# Patient Record
Sex: Male | Born: 1980
Health system: Southern US, Community
[De-identification: ages and names within clinical notes are randomized; demographics above are authoritative.]

## PROBLEM LIST (undated history)

## (undated) DIAGNOSIS — N183 Chronic kidney disease, stage 3 unspecified: Secondary | ICD-10-CM

## (undated) DIAGNOSIS — Z87442 Personal history of urinary calculi: Secondary | ICD-10-CM

## (undated) DIAGNOSIS — K219 Gastro-esophageal reflux disease without esophagitis: Secondary | ICD-10-CM

## (undated) DIAGNOSIS — E785 Hyperlipidemia, unspecified: Secondary | ICD-10-CM

## (undated) DIAGNOSIS — G4733 Obstructive sleep apnea (adult) (pediatric): Secondary | ICD-10-CM

## (undated) DIAGNOSIS — G4730 Sleep apnea, unspecified: Secondary | ICD-10-CM

## (undated) DIAGNOSIS — M109 Gout, unspecified: Secondary | ICD-10-CM

## (undated) DIAGNOSIS — R519 Headache, unspecified: Secondary | ICD-10-CM

## (undated) DIAGNOSIS — Z8669 Personal history of other diseases of the nervous system and sense organs: Secondary | ICD-10-CM

## (undated) DIAGNOSIS — B0230 Zoster ocular disease, unspecified: Secondary | ICD-10-CM

## (undated) DIAGNOSIS — I130 Hypertensive heart and chronic kidney disease with heart failure and stage 1 through stage 4 chronic kidney disease, or unspecified chronic kidney disease: Secondary | ICD-10-CM

## (undated) DIAGNOSIS — K589 Irritable bowel syndrome without diarrhea: Secondary | ICD-10-CM

## (undated) DIAGNOSIS — F32A Depression, unspecified: Secondary | ICD-10-CM

## (undated) DIAGNOSIS — K5909 Other constipation: Secondary | ICD-10-CM

## (undated) DIAGNOSIS — I509 Heart failure, unspecified: Secondary | ICD-10-CM

## (undated) DIAGNOSIS — E669 Obesity, unspecified: Secondary | ICD-10-CM

## (undated) DIAGNOSIS — K5900 Constipation, unspecified: Secondary | ICD-10-CM

## (undated) DIAGNOSIS — I10 Essential (primary) hypertension: Secondary | ICD-10-CM

## (undated) HISTORY — PX: EYE SURGERY: SHX253

## (undated) HISTORY — DX: Obesity, unspecified: E66.9

## (undated) HISTORY — DX: Irritable bowel syndrome, unspecified: K58.9

## (undated) HISTORY — DX: Sleep apnea, unspecified: G47.30

## (undated) HISTORY — PX: RETINAL DETACHMENT REPAIR W/ SCLERAL BUCKLE LE: SHX2338

## (undated) HISTORY — PX: CHOLECYSTECTOMY: SHX55

## (undated) HISTORY — DX: Constipation, unspecified: K59.00

## (undated) HISTORY — DX: Hyperlipidemia, unspecified: E78.5

## (undated) HISTORY — DX: Chronic kidney disease, stage 3 unspecified: N18.30

## (undated) HISTORY — DX: Gout, unspecified: M10.9

## (undated) HISTORY — DX: Chronic kidney disease, stage 3 (moderate): N18.3

## (undated) HISTORY — DX: Gastro-esophageal reflux disease without esophagitis: K21.9

## (undated) HISTORY — DX: Zoster ocular disease, unspecified: B02.30

---

## 2001-05-19 ENCOUNTER — Emergency Department (HOSPITAL_COMMUNITY): Admission: EM | Admit: 2001-05-19 | Discharge: 2001-05-19 | Payer: Self-pay | Admitting: Emergency Medicine

## 2002-05-19 ENCOUNTER — Emergency Department (HOSPITAL_COMMUNITY): Admission: EM | Admit: 2002-05-19 | Discharge: 2002-05-19 | Payer: Self-pay | Admitting: Emergency Medicine

## 2011-01-05 DIAGNOSIS — H3320 Serous retinal detachment, unspecified eye: Secondary | ICD-10-CM | POA: Insufficient documentation

## 2011-02-14 DIAGNOSIS — H348322 Tributary (branch) retinal vein occlusion, left eye, stable: Secondary | ICD-10-CM | POA: Insufficient documentation

## 2011-08-16 ENCOUNTER — Emergency Department (HOSPITAL_BASED_OUTPATIENT_CLINIC_OR_DEPARTMENT_OTHER): Payer: BC Managed Care – PPO

## 2011-08-16 ENCOUNTER — Emergency Department (HOSPITAL_BASED_OUTPATIENT_CLINIC_OR_DEPARTMENT_OTHER)
Admission: EM | Admit: 2011-08-16 | Discharge: 2011-08-16 | Disposition: A | Discharge status: Home / Self Care | Payer: BC Managed Care – PPO | Attending: Emergency Medicine | Admitting: Emergency Medicine

## 2011-08-16 ENCOUNTER — Encounter (HOSPITAL_BASED_OUTPATIENT_CLINIC_OR_DEPARTMENT_OTHER): Payer: Self-pay

## 2011-08-16 DIAGNOSIS — I130 Hypertensive heart and chronic kidney disease with heart failure and stage 1 through stage 4 chronic kidney disease, or unspecified chronic kidney disease: Secondary | ICD-10-CM | POA: Insufficient documentation

## 2011-08-16 DIAGNOSIS — M25469 Effusion, unspecified knee: Secondary | ICD-10-CM | POA: Insufficient documentation

## 2011-08-16 DIAGNOSIS — M25462 Effusion, left knee: Secondary | ICD-10-CM

## 2011-08-16 DIAGNOSIS — N183 Chronic kidney disease, stage 3 unspecified: Secondary | ICD-10-CM | POA: Insufficient documentation

## 2011-08-16 DIAGNOSIS — N189 Chronic kidney disease, unspecified: Secondary | ICD-10-CM | POA: Insufficient documentation

## 2011-08-16 DIAGNOSIS — I509 Heart failure, unspecified: Secondary | ICD-10-CM | POA: Insufficient documentation

## 2011-08-16 HISTORY — DX: Chronic kidney disease, stage 3 (moderate): N18.3

## 2011-08-16 HISTORY — DX: Hypertensive heart and chronic kidney disease with heart failure and stage 1 through stage 4 chronic kidney disease, or unspecified chronic kidney disease: I13.0

## 2011-08-16 HISTORY — DX: Heart failure, unspecified: I50.9

## 2011-08-16 HISTORY — DX: Chronic kidney disease, stage 3 unspecified: N18.30

## 2011-08-16 MED ORDER — HYDROCODONE-ACETAMINOPHEN 5-325 MG PO TABS
2.0000 | ORAL_TABLET | Freq: Once | ORAL | Status: AC
  Administered 2011-08-16: 2 via ORAL
  Filled 2011-08-16: qty 2

## 2011-08-16 MED ORDER — HYDROCODONE-ACETAMINOPHEN 5-325 MG PO TABS: 2.0000 | ORAL_TABLET | ORAL | Status: AC | PRN

## 2011-08-16 NOTE — ED Notes (Signed)
Pt returned from radiology.  Warm blankets provided.

## 2011-08-16 NOTE — ED Provider Notes (Signed)
History     CSN: [id number]  Arrival date & time 08/16/11  1703   First MD Initiated Contact with Patient 08/16/11 1732      Chief Complaint  Patient presents with  . Knee Injury    (Consider location/radiation/quality/duration/timing/severity/associated sxs/prior treatment) Patient is a 31 y.o. male presenting with knee pain. The history is provided by the patient. No language interpreter was used.  Knee Pain This is a new problem. Episode onset: monday  2 days ago. The problem occurs constantly. The symptoms are aggravated by bending. He has tried acetaminophen for the symptoms.  Pt reports he was standing on a lift gate and it jerked up from dock quickly.  Pt reports knee was pushed up quickly.  Pt reports increased swelling and pain  Past Medical History  Diagnosis Date  . CHF (congestive heart failure)   . 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 Cholecystectomy     No family history on file.  History  Substance Use Topics  . Smoking status: Never Smoker   . Smokeless tobacco: Not on file  . Alcohol Use: Yes     socially      Review of Systems  All other systems reviewed and are negative.    Allergies  Review of patient's allergies indicates no known allergies.  Home Medications   Current Outpatient Rx  Name Route Sig Dispense Refill  . CARVEDILOL 25 MG PO TABS Oral Take 25 mg by mouth 2 (two) times daily with a meal. Patient is to use 37.5 milligrams of this medication.    . FUROSEMIDE 40 MG PO TABS Oral Take 40 mg by mouth daily.    [name] HYDRALAZINE HCL 100 MG PO TABS Oral Take 100 mg by mouth 3 (three) times daily.    . IBUPROFEN 200 MG PO TABS Oral Take 400 mg by mouth every 6 (six) hours as needed. For pain.    [name] OLMESARTAN MEDOXOMIL 40 MG PO TABS Oral Take 40 mg by mouth daily.    [name] SPIRONOLACTONE 25 MG PO TABS Oral Take 25 mg by mouth daily.      BP 147/98   Pulse 75  Temp 99.7 F (37.6 C) (Oral)  Resp 18  Ht 5' 8.5" (1.74 m)  Wt 337 lb (152.862 kg)  BMI 50.50 kg/m2  SpO2 97%  Physical Exam  Nursing note and vitals reviewed. Constitutional: He is oriented to person, place, and time. He appears well-developed and well-nourished.  HENT:  Head: Normocephalic and atraumatic.  Eyes: Pupils are equal, round, and reactive to light.  Neck: Normal range of motion.  Musculoskeletal: He exhibits tenderness.       Tender left knee, effusion, decreased range of motion,   nv and ns intact  Neurological: He is alert and oriented to person, place, and time. He has normal reflexes.  Skin: Skin is warm.  Psychiatric: He has a normal mood and affect.    ED Course  Procedures (including critical care time)  Labs Reviewed - No data to display Dg Knee Complete 4 Views Left  08/16/2011  *RADIOLOGY REPORT*  Clinical Data: Left knee pain.  Left knee injury.  LEFT KNEE - COMPLETE 4+ VIEW  Comparison: None.  Findings: No fracture.  Mild medial compartment osteoarthritis.  No effusion.  The alignment of the knee is anatomic.  Small dystrophic calcification is present along the lateral proximal tibial metaphysis  and muscular origin.  IMPRESSION: No acute osseous abnormality.  Mild medial compartment osteoarthritis.  Original Report Authenticated By: Dereck Ligas, M.D.     1. Effusion of left knee       MDM  Pt placed in knee imbolizer,   I advised schedule to see Dr. Barbaraann Barthel for recheck in 3-4 days.  Ice elevate,  Rx for ibuprofen and hydrocodone       [place], [place] 08/16/11 [place], [place] 08/16/11 [phone]

## 2011-08-16 NOTE — ED Notes (Signed)
Injury to left knee that occurred while at work on Monday.

## 2011-08-16 NOTE — ED Notes (Signed)
Patient transported to X-ray via stretcher 

## 2011-08-17 ENCOUNTER — Ambulatory Visit (INDEPENDENT_AMBULATORY_CARE_PROVIDER_SITE_OTHER): Payer: BC Managed Care – PPO | Admitting: Family Medicine

## 2011-08-17 ENCOUNTER — Encounter: Payer: Self-pay | Admitting: Family Medicine

## 2011-08-17 VITALS — BP 116/74 | HR 80 | Ht 69.0 in | Wt 337.0 lb

## 2011-08-17 DIAGNOSIS — S8992XA Unspecified injury of left lower leg, initial encounter: Secondary | ICD-10-CM | POA: Insufficient documentation

## 2011-08-17 DIAGNOSIS — S8990XA Unspecified injury of unspecified lower leg, initial encounter: Secondary | ICD-10-CM

## 2011-08-17 DIAGNOSIS — S99919A Unspecified injury of unspecified ankle, initial encounter: Secondary | ICD-10-CM

## 2011-08-17 NOTE — Patient Instructions (Addendum)
Your injury and exam are most consistent with a severe knee contusion though lateral meniscal tear is also possible. Both are treated conservatively at first (unless your knee locks in one position). Ice knee 15 minutes at a time 3-4 times a day. Topical capsaicin up to 4 times a day can help with pain as well. Knee brace as needed for support. Crutches if needed to help with walking. Elevate above the level of your heart as much as possible. Start motion exercises - 3 sets of 10 with or without a towel - do daily for next 2 weeks. Follow up with me in 2 weeks. Would consider formal physical therapy or MRI depending on your progress.

## 2011-08-17 NOTE — Assessment & Plan Note (Signed)
most consistent with severe knee contusion though may have lateral meniscal tear.  X-rays reviewed - no bony abnormalities.  Icing, ROM exercises, hydrocodone 5/500 q6h prn severe pain #60 prescribed.  No nsaids with kidney disease.  Consider capsaicin.  Crutches and knee brace - weight bear as tolerated.  F/u in 2 weeks for reevaluation - consider further imaging or PT depending on improvement.

## 2011-08-17 NOTE — Progress Notes (Signed)
Subjective:    Patient ID: Frank Coffey, male    DOB: 12-03-1980, 31 y.o.   MRN: [id number]  PCP: [facility]  HPI 31 yo M here for left knee injury.  Patient reports on 7/29 he was at work standing half on a truck and half on a dock at [employer]. Spring broke on dock plate and the plate went upward jamming his left knee. Pain anterior, medial, and lateral within left knee since than that worsened throughout shift. + swelling but no bruising. No prior injury to left knee. Has been icing and taking hydrocodone from ED. Using crutches and wearing immobilizer. X-rays negative for fracture or other acute bony abnormalities. No catching or locking. Feels stiff - ? If may hyperextend.  Past Medical History  Diagnosis Date  . CHF (congestive heart failure)   . 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 Chronic kidney disease, stage 3, mod decreased GFR     Current Outpatient Prescriptions on File Prior to Visit  Medication Sig Dispense Refill  . carvedilol (COREG) 25 MG tablet Take 25 mg by mouth 2 (two) times daily with a meal. Patient is to use 37.5 milligrams of this medication.      . furosemide (LASIX) 40 MG tablet Take 40 mg by mouth daily.      . hydrALAZINE (APRESOLINE) 100 MG tablet Take 100 mg by mouth 3 (three) times daily.      [name] HYDROcodone-acetaminophen (NORCO/VICODIN) 5-325 MG per tablet Take 2 tablets by mouth every 4 (four) hours as needed for pain.  20 tablet  0  . olmesartan (BENICAR) 40 MG tablet Take 40 mg by mouth daily.      [name] spironolactone (ALDACTONE) 25 MG tablet Take 25 mg by mouth daily.       Current Facility-Administered Medications on File Prior to Visit  Medication Dose Route Frequency Provider Last Rate Last Dose  . HYDROcodone-acetaminophen (NORCO/VICODIN) 5-325 MG per tablet 2 tablet  2 tablet Oral Once [place], [place]   2 tablet at 08/16/11 1800    Past Surgical History  Procedure Date  .  Cholecystectomy     No Known Allergies  History   Social History  . Marital Status: Married    Spouse Name: N/A    Number of Children: N/A  . Years of Education: N/A   Occupational History  . Not on file.   Social History Main Topics  . Smoking status: Never Smoker   . Smokeless tobacco: Not on file  . Alcohol Use: Yes     socially  . Drug Use: No  . Sexually Active: Not on file   Other Topics Concern  . Not on file   Social History Narrative  . No narrative on file    Family History  Problem Relation Age of Onset  . Hypertension Mother   . Diabetes Neg Hx   . Heart attack Neg Hx   . Hyperlipidemia Neg Hx   . Sudden death Neg Hx     BP 116/74  Pulse 80  Ht 5\' 9"  (1.753 m)  Wt 337 lb (152.862 kg)  BMI 49.77 kg/m2  Review of Systems See HPI above.    Objective:   Physical Exam Gen: NAD  L knee: No gross deformity, ecchymoses.  Questionable effusion.   Lateral joint line and post patellar facet TTP.  No medial joint line ttp.  TTP patellar tendon as well. ROM 0 -  90 degrees passively and actively. Negative ant/post drawers - guarding. Negative valgus/varus testing. Negative lachmanns. Pain laterally with mcmurrays, apleys but no clicks Negative patellar apprehension. NV intact distally.  R knee: FROM without pain or instability.    Assessment & Plan:  1. Left knee injury - most consistent with severe knee contusion though may have lateral meniscal tear.  X-rays reviewed - no bony abnormalities.  Icing, ROM exercises, hydrocodone 5/500 q6h prn severe pain #60 prescribed.  No nsaids with kidney disease.  Consider capsaicin.  Crutches and knee brace - weight bear as tolerated.  F/u in 2 weeks for reevaluation - consider further imaging or PT depending on improvement.

## 2011-08-20 NOTE — ED Provider Notes (Signed)
Medical screening examination/treatment/procedure(s) were performed by non-physician practitioner and as supervising physician I was immediately available for consultation/collaboration.  Chauncy Passy, MD 08/20/11 1505

## 2011-08-31 ENCOUNTER — Ambulatory Visit: Payer: Self-pay | Admitting: Family Medicine

## 2011-11-10 ENCOUNTER — Emergency Department (HOSPITAL_BASED_OUTPATIENT_CLINIC_OR_DEPARTMENT_OTHER)
Admission: EM | Admit: 2011-11-10 | Discharge: 2011-11-10 | Disposition: A | Discharge status: Home / Self Care | Payer: Worker's Compensation | Attending: Emergency Medicine | Admitting: Emergency Medicine

## 2011-11-10 ENCOUNTER — Emergency Department (HOSPITAL_BASED_OUTPATIENT_CLINIC_OR_DEPARTMENT_OTHER): Payer: Worker's Compensation

## 2011-11-10 ENCOUNTER — Encounter (HOSPITAL_BASED_OUTPATIENT_CLINIC_OR_DEPARTMENT_OTHER): Payer: Self-pay | Admitting: *Deleted

## 2011-11-10 DIAGNOSIS — Z79899 Other long term (current) drug therapy: Secondary | ICD-10-CM | POA: Insufficient documentation

## 2011-11-10 DIAGNOSIS — Y990 Civilian activity done for income or pay: Secondary | ICD-10-CM | POA: Insufficient documentation

## 2011-11-10 DIAGNOSIS — N189 Chronic kidney disease, unspecified: Secondary | ICD-10-CM | POA: Insufficient documentation

## 2011-11-10 DIAGNOSIS — N183 Chronic kidney disease, stage 3 unspecified: Secondary | ICD-10-CM | POA: Insufficient documentation

## 2011-11-10 DIAGNOSIS — I130 Hypertensive heart and chronic kidney disease with heart failure and stage 1 through stage 4 chronic kidney disease, or unspecified chronic kidney disease: Secondary | ICD-10-CM | POA: Insufficient documentation

## 2011-11-10 DIAGNOSIS — S9030XA Contusion of unspecified foot, initial encounter: Secondary | ICD-10-CM | POA: Insufficient documentation

## 2011-11-10 DIAGNOSIS — Y929 Unspecified place or not applicable: Secondary | ICD-10-CM | POA: Insufficient documentation

## 2011-11-10 DIAGNOSIS — W208XXA Other cause of strike by thrown, projected or falling object, initial encounter: Secondary | ICD-10-CM | POA: Insufficient documentation

## 2011-11-10 NOTE — ED Notes (Signed)
Patient states he dropped a box weighting approximately 65 lbs on his left foot today at 10:00 am.  Now has pain on left great toe and foot, with injury to his toenail.

## 2011-11-10 NOTE — ED Provider Notes (Addendum)
History     CSN: [id number]  Arrival date & time 11/10/11  59   First MD Initiated Contact with Patient 11/10/11 1458      Chief Complaint  Patient presents with  . Foot Injury    left    (Consider location/radiation/quality/duration/timing/severity/associated sxs/prior treatment) Patient is a 31 y.o. male presenting with foot injury. The history is provided by the patient.  Foot Injury  The incident occurred 1 to 2 hours ago. The incident occurred at work. The injury mechanism was a direct blow (65lb box fell on left foot). The pain is present in the left foot. The quality of the pain is described as aching and throbbing. The pain is at a severity of 4/10. The pain is moderate. The pain has been constant since onset. Pertinent negatives include no inability to bear weight, no loss of motion and no tingling. The symptoms are aggravated by bearing weight and palpation. He has tried nothing for the symptoms. The treatment provided no relief.    Past Medical History  Diagnosis Date  . CHF (congestive heart failure)   . 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 Chronic kidney disease, stage 3, mod decreased GFR     Past Surgical History  Procedure Date  . Cholecystectomy     Family History  Problem Relation Age of Onset  . Hypertension Mother   . Diabetes Neg Hx   . Heart attack Neg Hx   . Hyperlipidemia Neg Hx   . Sudden death Neg Hx     History  Substance Use Topics  . Smoking status: Never Smoker   . Smokeless tobacco: Not on file  . Alcohol Use: Yes     socially      Review of Systems  Neurological: Negative for tingling.  All other systems reviewed and are negative.    Allergies  Review of patient's allergies indicates no known allergies.  Home Medications   Current Outpatient Rx  Name Route Sig Dispense Refill  . CARVEDILOL 25 MG PO TABS Oral Take 25 mg by mouth 2 (two) times daily with a meal. Patient  is to use 37.5 milligrams of this medication.    . FUROSEMIDE 40 MG PO TABS Oral Take 40 mg by mouth daily.    [name] HYDRALAZINE HCL 100 MG PO TABS Oral Take 100 mg by mouth 3 (three) times daily.    [name] OLMESARTAN MEDOXOMIL 40 MG PO TABS Oral Take 40 mg by mouth daily.    [name] SPIRONOLACTONE 25 MG PO TABS Oral Take 25 mg by mouth daily.      BP 145/91  Pulse 84  Temp 99.4 F (37.4 C) (Oral)  Resp 16  Ht 5' 8.5" (1.74 m)  Wt 341 lb (154.677 kg)  BMI 51.09 kg/m2  SpO2 97%  Physical Exam  Nursing note and vitals reviewed. Constitutional: He is oriented to person, place, and time. He appears well-developed and well-nourished. No distress.  HENT:  Head: Normocephalic and atraumatic.  Mouth/Throat: Oropharynx is clear and moist.  Eyes: EOM are normal. Pupils are equal, round, and reactive to light.  Musculoskeletal:       Left foot: He exhibits tenderness. He exhibits normal range of motion and no deformity.       Feet:  Neurological: He is alert and oriented to person, place, and time.  Skin: Skin is warm and dry. No rash noted. No erythema.    ED Course  Procedures (including  critical care time)  Labs Reviewed - No data to display Dg Foot Complete Left  11/10/2011  *RADIOLOGY REPORT*  Clinical Data: Foot injury  LEFT FOOT - COMPLETE 3+ VIEW  Comparison: None  Findings: There is no evidence of fracture or dislocation.Posterior calcaneal heel spur noted.  Soft tissues are unremarkable.  IMPRESSION:  1.  No acute bony abnormalities.   Original Report Authenticated By: Angelita Ingles, M.D.      1. Foot contusion       MDM   Crush injury to the left foot.  No deformity and no ankle tenderness.  Plain film wnl.        Blanchie Dessert, MD 11/10/11 1604  Blanchie Dessert, MD 11/10/11 [phone]

## 2012-02-23 DIAGNOSIS — H25042 Posterior subcapsular polar age-related cataract, left eye: Secondary | ICD-10-CM | POA: Insufficient documentation

## 2012-07-24 ENCOUNTER — Emergency Department (HOSPITAL_BASED_OUTPATIENT_CLINIC_OR_DEPARTMENT_OTHER)
Admission: EM | Admit: 2012-07-24 | Discharge: 2012-07-24 | Disposition: A | Discharge status: Home / Self Care | Payer: BC Managed Care – PPO | Attending: Emergency Medicine | Admitting: Emergency Medicine

## 2012-07-24 ENCOUNTER — Emergency Department (HOSPITAL_BASED_OUTPATIENT_CLINIC_OR_DEPARTMENT_OTHER): Payer: BC Managed Care – PPO

## 2012-07-24 ENCOUNTER — Encounter (HOSPITAL_BASED_OUTPATIENT_CLINIC_OR_DEPARTMENT_OTHER): Payer: Self-pay | Admitting: Family Medicine

## 2012-07-24 DIAGNOSIS — R51 Headache: Secondary | ICD-10-CM | POA: Insufficient documentation

## 2012-07-24 DIAGNOSIS — I130 Hypertensive heart and chronic kidney disease with heart failure and stage 1 through stage 4 chronic kidney disease, or unspecified chronic kidney disease: Secondary | ICD-10-CM | POA: Insufficient documentation

## 2012-07-24 DIAGNOSIS — I509 Heart failure, unspecified: Secondary | ICD-10-CM | POA: Insufficient documentation

## 2012-07-24 DIAGNOSIS — Z79899 Other long term (current) drug therapy: Secondary | ICD-10-CM | POA: Insufficient documentation

## 2012-07-24 DIAGNOSIS — N183 Chronic kidney disease, stage 3 unspecified: Secondary | ICD-10-CM | POA: Insufficient documentation

## 2012-07-24 DIAGNOSIS — R519 Headache, unspecified: Secondary | ICD-10-CM

## 2012-07-24 LAB — CBC WITH DIFFERENTIAL/PLATELET
Basophils Absolute: 0 10*3/uL (ref 0.0–0.1)
Basophils Relative: 0 % (ref 0–1)
Eosinophils Absolute: 0.2 10*3/uL (ref 0.0–0.7)
MCH: 23.3 pg — ABNORMAL LOW (ref 26.0–34.0)
MCHC: 32.6 g/dL (ref 30.0–36.0)
Monocytes Absolute: 1.6 10*3/uL — ABNORMAL HIGH (ref 0.1–1.0)
Monocytes Relative: 18 % — ABNORMAL HIGH (ref 3–12)
Neutro Abs: 5.8 10*3/uL (ref 1.7–7.7)
Neutrophils Relative %: 66 % (ref 43–77)
RDW: 18.7 % — ABNORMAL HIGH (ref 11.5–15.5)

## 2012-07-24 LAB — BASIC METABOLIC PANEL
BUN: 23 mg/dL (ref 6–23)
Calcium: 10 mg/dL (ref 8.4–10.5)
GFR calc Af Amer: 30 mL/min — ABNORMAL LOW (ref >90)
GFR calc non Af Amer: 26 mL/min — ABNORMAL LOW (ref >90)
Glucose, Bld: 94 mg/dL (ref 70–99)
Potassium: 3.5 mEq/L (ref 3.5–5.1)
Sodium: 138 mEq/L (ref 135–145)

## 2012-07-24 MED ORDER — KETOROLAC TROMETHAMINE 30 MG/ML IJ SOLN
30.0000 mg | Freq: Once | INTRAMUSCULAR | Status: AC
  Administered 2012-07-24: 30 mg via INTRAVENOUS
  Filled 2012-07-24: qty 1

## 2012-07-24 MED ORDER — SODIUM CHLORIDE 0.9 % IV BOLUS (SEPSIS)
1000.0000 mL | Freq: Once | INTRAVENOUS | Status: AC
  Administered 2012-07-24: 1000 mL via INTRAVENOUS

## 2012-07-24 MED ORDER — HYDROMORPHONE HCL PF 1 MG/ML IJ SOLN
1.0000 mg | Freq: Once | INTRAMUSCULAR | Status: AC
  Administered 2012-07-24: 1 mg via INTRAVENOUS
  Filled 2012-07-24: qty 1

## 2012-07-24 MED ORDER — METOCLOPRAMIDE HCL 5 MG/ML IJ SOLN
10.0000 mg | Freq: Once | INTRAMUSCULAR | Status: AC
  Administered 2012-07-24: 10 mg via INTRAVENOUS
  Filled 2012-07-24: qty 2

## 2012-07-24 MED ORDER — SODIUM CHLORIDE 0.9 % IV SOLN: INTRAVENOUS | Status: DC

## 2012-07-24 MED ORDER — DIPHENHYDRAMINE HCL 50 MG/ML IJ SOLN
25.0000 mg | Freq: Once | INTRAMUSCULAR | Status: AC
  Administered 2012-07-24: 25 mg via INTRAVENOUS
  Filled 2012-07-24: qty 1

## 2012-07-24 NOTE — ED Provider Notes (Signed)
Medical screening examination/treatment/procedure(s) were performed by non-physician practitioner and as supervising physician I was immediately available for consultation/collaboration.   Julianne Rice, MD 07/24/12 2214

## 2012-07-24 NOTE — ED Notes (Signed)
Advised to recheck VS after pain med

## 2012-07-24 NOTE — ED Notes (Signed)
Pt c/o headache intermittent x 1 wk. Pt reading cell phone throughout triage without difficulty. Pt drove self to ED.

## 2012-07-24 NOTE — ED Provider Notes (Signed)
History    CSN: [id number] Arrival date & time 07/24/12  1354  First MD Initiated Contact with Patient 07/24/12 1454     Chief Complaint  Patient presents with  . Headache   (Consider location/radiation/quality/duration/timing/severity/associated sxs/prior Treatment) HPI  32 year old male with history of CHF, stage 3 chronic kidney disease, hypertension currently on 3 different blood pressure medication here for evaluations of headache. Patient reports intermittent headache ongoing for the past week. Patient reports gradual onset of intermittent headache. Pain is described as a throbbing aching sensation to his forehead radiates to the back of his head with mild neck discomfort. Pain is associated with light and sounds sensitivity, nausea without vomiting or diarrhea.  Pain usually last all day long, seems to improve after taking BC powder and nothing seems to make the pain worse. Yesterday patient also experiencing tightness sensation to his mid chest which lasts for about 10 minutes and stopped. He also endorsed increased shortness of breath for an hour but has resolved. Patient denies fever, chills, sneezing, coughing, hemoptysis, sore throat, chest pain, abdominal pain, dysuria, or rash. Patient has been taking all of his medication as prescribed. Patient has been eating and drinking as usual. He denies having history of recurrent headaches.  Past Medical History  Diagnosis Date  . CHF (congestive heart failure)   . 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 Chronic kidney disease, stage 3, mod decreased GFR    Past Surgical History  Procedure Laterality Date  . Cholecystectomy     Family History  Problem Relation Age of Onset  . Hypertension Mother   . Diabetes Neg Hx   . Heart attack Neg Hx   . Hyperlipidemia Neg Hx   . Sudden death Neg Hx    History  Substance Use Topics  . Smoking status: Never Smoker   . Smokeless tobacco:  Not on file  . Alcohol Use: Yes     Comment: socially    Review of Systems  All other systems reviewed and are negative.    Allergies  Review of patient's allergies indicates no known allergies.  Home Medications   Current Outpatient Rx  Name  Route  Sig  Dispense  Refill  . Vitamin D, Ergocalciferol, (DRISDOL) 50000 UNITS CAPS   Oral   Take 50,000 Units by mouth.         . carvedilol (COREG) 25 MG tablet   Oral   Take 25 mg by mouth 2 (two) times daily with a meal. Patient is to use 37.5 milligrams of this medication.         . furosemide (LASIX) 40 MG tablet   Oral   Take 40 mg by mouth daily.         . hydrALAZINE (APRESOLINE) 100 MG tablet   Oral   Take 100 mg by mouth 3 (three) times daily.         [name] olmesartan (BENICAR) 40 MG tablet   Oral   Take 40 mg by mouth daily.         [name] spironolactone (ALDACTONE) 25 MG tablet   Oral   Take 25 mg by mouth daily.          BP 183/117  Pulse 104  Temp(Src) 99.2 F (37.3 C) (Oral)  Resp 20  Ht 5\' 8"  (1.727 m)  Wt 339 lb (153.769 kg)  BMI 51.56 kg/m2  SpO2 96% Physical Exam  Nursing note and vitals reviewed. Constitutional:  He is oriented to person, place, and time. He appears well-developed and well-nourished. No distress.  Morbidly obese, nontoxic in appearance  HENT:  Head: Normocephalic and atraumatic.  Mouth/Throat: Oropharynx is clear and moist.  frontal sinus tenderness on percussion  Eyes: Conjunctivae and EOM are normal. Pupils are equal, round, and reactive to light.  Neck: Normal range of motion. Neck supple.  No meningismal sign, no nuchal rigidity  Cardiovascular:  Mildly tachycardic without murmurs, rubs, or gallops  Pulmonary/Chest: Effort normal and breath sounds normal. He has no wheezes. He has no rales.  No respiratory distress, no wheezes, rales, or rhonchi heard  Abdominal: Soft.  Musculoskeletal: Normal range of motion. He exhibits no edema.  Neurological: He is alert and  oriented to person, place, and time. He has normal strength. No cranial nerve deficit or sensory deficit. He displays a negative Romberg sign. Coordination and gait normal. GCS eye subscore is 4. GCS verbal subscore is 5. GCS motor subscore is 6.    ED Course  Procedures (including critical care time)  4:06 PM Patient with complaint of headache, intermittent for the past one week. No sudden onset thunderclap headache concerning for subarachnoid hemorrhage. No fever, chills, or nuchal rigidity concerning for meningitis. No focal neuro deficits concerning for stroke. Patient however has elevated blood pressure is 183/117 despite taking 4 different types of blood pressure medication. He has been taking his medication as prescribed. Plan to give pt migrain cocktail. Check basic labs, obtain head CT and chest x-ray. We'll recheck his blood pressure. Care discussed with attending.  6:31 PM Patient states headache initially improved but now has returned. He also has an appetite requests some food to eat. Patient appears comfortable, resting in bed.  7:11 PM Patient states he felt much better, headache is now gone. He is able to target by mouth and ate a full meal. He appears comfortable sitting in bed. However on recheck vital signs his blood pressure has elevated to 200/110.  Head CT is unremarkable, chest x-ray shows no signs of infection, labs are reassuring.  7:28 PM Recheck VS is reassuring, pt stable for discharge.  Pt to f/u with PCP for further care.     Labs Reviewed  CBC WITH DIFFERENTIAL - Abnormal; Notable for the following:    RBC 6.00 (*)    MCV 71.7 (*)    MCH 23.3 (*)    RDW 18.7 (*)    Monocytes Relative 18 (*)    Monocytes Absolute 1.6 (*)    All other components within normal limits  BASIC METABOLIC PANEL - Abnormal; Notable for the following:    Creatinine, Ser 3.00 (*)    GFR calc non Af Amer 26 (*)    GFR calc Af Amer 30 (*)    All other components within normal limits   PRO B NATRIURETIC PEPTIDE   Dg Chest 2 View  07/24/2012   *RADIOLOGY REPORT*  Clinical Data: 32 year old male with shortness of breath, headache.  CHEST - 2 VIEW  Comparison: None.  Findings:  Cardiac size and mediastinal contours are within normal limits.  Visualized tracheal air column is within normal limits. Somewhat low lung volumes with mild crowding lung markings.  No pneumothorax, pulmonary edema, pleural effusion or consolidation. No acute osseous abnormality identified.  IMPRESSION: No acute cardiopulmonary abnormality.   Original Report Authenticated By: Roselyn Reef, M.D.   Ct Head Wo Contrast  07/24/2012   *RADIOLOGY REPORT*  Clinical Data:  Headache  CT HEAD WITHOUT CONTRAST  Technique:  Contiguous axial images were obtained from the base of the skull through the vertex without contrast  Comparison:  None.  Findings:  The brain has a normal appearance without evidence for hemorrhage, acute infarction, hydrocephalus, or mass lesion.  There is no extra axial fluid collection.  The skull and paranasal sinuses are normal.  Increased density in the vitreous on the left.  This may be due to prior surgical procedure.  Scleral band is present on the left.  IMPRESSION: Normal CT of the head without contrast.  Left globe abnormality may be postsurgical.   Original Report Authenticated By: Carl Best, M.D.   1. Headache     MDM  BP 170/100  Pulse 94  Temp(Src) 98 F (36.7 C) (Oral)  Resp 18  Ht 5\' 8"  (1.727 m)  Wt 339 lb (153.769 kg)  BMI 51.56 kg/m2  SpO2 97%  I have reviewed nursing notes and vital signs. I personally reviewed the imaging tests through PACS system  I reviewed available ER/hospitalization records thought the EMR   Domenic Moras, [place] 07/24/12 1929

## 2012-07-24 NOTE — ED Notes (Signed)
Return from xray

## 2012-11-20 ENCOUNTER — Emergency Department (HOSPITAL_BASED_OUTPATIENT_CLINIC_OR_DEPARTMENT_OTHER)
Admission: EM | Admit: 2012-11-20 | Discharge: 2012-11-20 | Disposition: A | Discharge status: Home / Self Care | Payer: BC Managed Care – PPO | Attending: Emergency Medicine | Admitting: Emergency Medicine

## 2012-11-20 ENCOUNTER — Encounter (HOSPITAL_BASED_OUTPATIENT_CLINIC_OR_DEPARTMENT_OTHER): Payer: Self-pay | Admitting: Emergency Medicine

## 2012-11-20 DIAGNOSIS — I130 Hypertensive heart and chronic kidney disease with heart failure and stage 1 through stage 4 chronic kidney disease, or unspecified chronic kidney disease: Secondary | ICD-10-CM | POA: Insufficient documentation

## 2012-11-20 DIAGNOSIS — I509 Heart failure, unspecified: Secondary | ICD-10-CM | POA: Insufficient documentation

## 2012-11-20 DIAGNOSIS — Z79899 Other long term (current) drug therapy: Secondary | ICD-10-CM | POA: Insufficient documentation

## 2012-11-20 DIAGNOSIS — N183 Chronic kidney disease, stage 3 unspecified: Secondary | ICD-10-CM | POA: Insufficient documentation

## 2012-11-20 DIAGNOSIS — M109 Gout, unspecified: Secondary | ICD-10-CM

## 2012-11-20 MED ORDER — OXYCODONE-ACETAMINOPHEN 5-325 MG PO TABS
2.0000 | ORAL_TABLET | Freq: Once | ORAL | Status: AC
  Administered 2012-11-20: 2 via ORAL
  Filled 2012-11-20: qty 2

## 2012-11-20 MED ORDER — PREDNISONE 50 MG PO TABS
60.0000 mg | ORAL_TABLET | Freq: Once | ORAL | Status: AC
  Administered 2012-11-20: 60 mg via ORAL
  Filled 2012-11-20 (×2): qty 1

## 2012-11-20 MED ORDER — OXYCODONE-ACETAMINOPHEN 5-325 MG PO TABS: 1.0000 | ORAL_TABLET | Freq: Four times a day (QID) | ORAL | Status: DC | PRN

## 2012-11-20 MED ORDER — PREDNISONE 10 MG PO TABS: 40.0000 mg | ORAL_TABLET | Freq: Every day | ORAL | Status: DC

## 2012-11-20 NOTE — ED Notes (Signed)
Right great toe and side of foot started hurting yesterday and is getting more swollen and painful has been told before that he had gout

## 2012-11-20 NOTE — ED Provider Notes (Signed)
CSN: [id number]     Arrival date & time 11/20/12  1227 History   First MD Initiated Contact with Patient 11/20/12 1233     Chief Complaint  Patient presents with  . Gout   (Consider location/radiation/quality/duration/timing/severity/associated sxs/prior Treatment) HPI Comments: Hx of gout  Patient is a 32 y.o. male presenting with toe pain. The history is provided by the patient.  Toe Pain This is a recurrent problem. The current episode started 3 to 5 hours ago. The problem occurs constantly. The problem has not changed since onset.Pertinent negatives include no chest pain, no abdominal pain and no shortness of breath. The symptoms are aggravated by walking and standing. The symptoms are relieved by rest. He has tried nothing for the symptoms.    Past Medical History  Diagnosis Date  . CHF (congestive heart failure)   . 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 Chronic kidney disease, stage 3, mod decreased GFR    Past Surgical History  Procedure Laterality Date  . Cholecystectomy     Family History  Problem Relation Age of Onset  . Hypertension Mother   . Diabetes Neg Hx   . Heart attack Neg Hx   . Hyperlipidemia Neg Hx   . Sudden death Neg Hx    History  Substance Use Topics  . Smoking status: Never Smoker   . Smokeless tobacco: Not on file  . Alcohol Use: Yes     Comment: socially    Review of Systems  Constitutional: Negative for fever.  Respiratory: Negative for cough and shortness of breath.   Cardiovascular: Negative for chest pain.  Gastrointestinal: Negative for vomiting and abdominal pain.  All other systems reviewed and are negative.    Allergies  Review of patient's allergies indicates no known allergies.  Home Medications   Current Outpatient Rx  Name  Route  Sig  Dispense  Refill  . carvedilol (COREG) 25 MG tablet   Oral   Take 25 mg by mouth 2 (two) times daily with a meal. Patient is to use 37.5  milligrams of this medication.         . furosemide (LASIX) 40 MG tablet   Oral   Take 40 mg by mouth daily.         . hydrALAZINE (APRESOLINE) 100 MG tablet   Oral   Take 100 mg by mouth 3 (three) times daily.         [name] olmesartan (BENICAR) 40 MG tablet   Oral   Take 40 mg by mouth daily.         [name] oxyCODONE-acetaminophen (PERCOCET/ROXICET) 5-325 MG per tablet   Oral   Take 1 tablet by mouth every 6 (six) hours as needed for severe pain.   30 tablet   0   . spironolactone (ALDACTONE) 25 MG tablet   Oral   Take 25 mg by mouth daily.         . Vitamin D, Ergocalciferol, (DRISDOL) 50000 UNITS CAPS   Oral   Take 50,000 Units by mouth.          BP 158/114  Pulse 82  Temp(Src) 98.6 F (37 C) (Oral)  Resp 20  SpO2 100% Physical Exam  Nursing note and vitals reviewed. Constitutional: He is oriented to person, place, and time. He appears well-developed and well-nourished. No distress.  HENT:  Head: Normocephalic and atraumatic.  Mouth/Throat: No oropharyngeal exudate.  Eyes: EOM are normal. Pupils are equal,  round, and reactive to light.  Neck: Normal range of motion. Neck supple.  Cardiovascular: Normal rate and regular rhythm.  Exam reveals no friction rub.   No murmur heard. Pulmonary/Chest: Effort normal and breath sounds normal. No respiratory distress. He has no wheezes. He has no rales.  Abdominal: He exhibits no distension. There is no tenderness. There is no rebound.  Musculoskeletal: Normal range of motion. He exhibits no edema.       Right foot: He exhibits tenderness.       Feet:  Neurological: He is alert and oriented to person, place, and time.  Skin: He is not diaphoretic.    ED Course  Procedures (including critical care time) Labs Review Labs Reviewed - No data to display Imaging Review No results found.  EKG Interpretation   None       MDM   1. Gout    59M with hx of CHF, HTN, CKD presents with right great toe pain. Hx of  gout, feels similar. Denies systemic symptoms. R great toe with 1st MTP joint swollen, erythematous. Denies previous arthrocentesis to confirm diagnosis. Patient refuses arthrocentesis today, states he'd rather see how it does. Cannot give colchicine due to CKD. Will give steroids and percocet.    Osvaldo Shipper, MD 11/20/12 [phone]

## 2012-12-18 ENCOUNTER — Other Ambulatory Visit: Payer: Self-pay | Admitting: *Deleted

## 2012-12-18 ENCOUNTER — Encounter: Payer: Self-pay | Admitting: Physician Assistant

## 2012-12-18 ENCOUNTER — Ambulatory Visit (INDEPENDENT_AMBULATORY_CARE_PROVIDER_SITE_OTHER): Payer: 59 | Admitting: Physician Assistant

## 2012-12-18 VITALS — BP 156/118 | HR 85 | Temp 98.0°F | Resp 20 | Ht 68.0 in | Wt 337.8 lb

## 2012-12-18 DIAGNOSIS — N183 Chronic kidney disease, stage 3 unspecified: Secondary | ICD-10-CM

## 2012-12-18 DIAGNOSIS — I10 Essential (primary) hypertension: Secondary | ICD-10-CM

## 2012-12-18 DIAGNOSIS — G4733 Obstructive sleep apnea (adult) (pediatric): Secondary | ICD-10-CM

## 2012-12-18 DIAGNOSIS — I509 Heart failure, unspecified: Secondary | ICD-10-CM

## 2012-12-18 DIAGNOSIS — Z7189 Other specified counseling: Secondary | ICD-10-CM

## 2012-12-18 DIAGNOSIS — M109 Gout, unspecified: Secondary | ICD-10-CM

## 2012-12-18 DIAGNOSIS — Z8639 Personal history of other endocrine, nutritional and metabolic disease: Secondary | ICD-10-CM

## 2012-12-18 DIAGNOSIS — Z299 Encounter for prophylactic measures, unspecified: Secondary | ICD-10-CM

## 2012-12-18 DIAGNOSIS — Z7689 Persons encountering health services in other specified circumstances: Secondary | ICD-10-CM

## 2012-12-18 DIAGNOSIS — Z862 Personal history of diseases of the blood and blood-forming organs and certain disorders involving the immune mechanism: Secondary | ICD-10-CM

## 2012-12-18 DIAGNOSIS — Z23 Encounter for immunization: Secondary | ICD-10-CM

## 2012-12-18 MED ORDER — CARVEDILOL 25 MG PO TABS: 25.0000 mg | ORAL_TABLET | Freq: Two times a day (BID) | ORAL | Status: DC

## 2012-12-18 MED ORDER — FUROSEMIDE 40 MG PO TABS: 40.0000 mg | ORAL_TABLET | Freq: Every day | ORAL | Status: DC

## 2012-12-18 MED ORDER — SPIRONOLACTONE 25 MG PO TABS: 25.0000 mg | ORAL_TABLET | Freq: Every day | ORAL | Status: DC

## 2012-12-18 MED ORDER — HYDRALAZINE HCL 100 MG PO TABS: 100.0000 mg | ORAL_TABLET | Freq: Three times a day (TID) | ORAL | Status: DC

## 2012-12-18 MED ORDER — OLMESARTAN MEDOXOMIL 40 MG PO TABS: 40.0000 mg | ORAL_TABLET | Freq: Every day | ORAL | Status: DC

## 2012-12-18 MED ORDER — COLCHICINE 0.6 MG PO TABS: ORAL_TABLET | ORAL | Status: DC

## 2012-12-18 NOTE — Progress Notes (Signed)
Pre visit review using our clinic review tool, if applicable. No additional management support is needed unless otherwise documented below in the visit note/SLS  

## 2012-12-18 NOTE — Progress Notes (Signed)
Pharmacy request clarification on refill authorization [prior Rx had Sig: Take 25 mg by mouth 2 (two) times daily with a meal. Patient is to use 37.5 milligrams of this medication]; phoned in correct Sig: Take 25 mg by mouth 2 (two) times daily with a meal & resent escribe/SLS

## 2012-12-18 NOTE — Patient Instructions (Signed)
Please continue to take medications as prescribed.  Schedule appointment with your Cardiologist.  Please return for labs.  You will be contacted for sleep study and by a Nephrologist office.  Please return in ~ 2 weeks for BP recheck.   Hypertension As your heart beats, it forces blood through your arteries. This force is your blood pressure. If the pressure is too high, it is called hypertension (HTN) or high blood pressure. HTN is dangerous because you may have it and not know it. High blood pressure may mean that your heart has to work harder to pump blood. Your arteries may be narrow or stiff. The extra work puts you at risk for heart disease, stroke, and other problems.  Blood pressure consists of two numbers, a higher number over a lower, 110/72, for example. It is stated as "110 over 72." The ideal is below 120 for the top number (systolic) and under 80 for the bottom (diastolic). Write down your blood pressure today. You should pay close attention to your blood pressure if you have certain conditions such as:  Heart failure.  Prior heart attack.  Diabetes  Chronic kidney disease.  Prior stroke.  Multiple risk factors for heart disease. To see if you have HTN, your blood pressure should be measured while you are seated with your arm held at the level of the heart. It should be measured at least twice. A one-time elevated blood pressure reading (especially in the Emergency Department) does not mean that you need treatment. There may be conditions in which the blood pressure is different between your right and left arms. It is important to see your caregiver soon for a recheck. Most people have essential hypertension which means that there is not a specific cause. This type of high blood pressure may be lowered by changing lifestyle factors such as:  Stress.  Smoking.  Lack of exercise.  Excessive weight.  Drug/tobacco/alcohol use.  Eating less salt. Most people do not have  symptoms from high blood pressure until it has caused damage to the body. Effective treatment can often prevent, delay or reduce that damage. TREATMENT  When a cause has been identified, treatment for high blood pressure is directed at the cause. There are a large number of medications to treat HTN. These fall into several categories, and your caregiver will help you select the medicines that are best for you. Medications may have side effects. You should review side effects with your caregiver. If your blood pressure stays high after you have made lifestyle changes or started on medicines,   Your medication(s) may need to be changed.  Other problems may need to be addressed.  Be certain you understand your prescriptions, and know how and when to take your medicine.  Be sure to follow up with your caregiver within the time frame advised (usually within two weeks) to have your blood pressure rechecked and to review your medications.  If you are taking more than one medicine to lower your blood pressure, make sure you know how and at what times they should be taken. Taking two medicines at the same time can result in blood pressure that is too low. SEEK IMMEDIATE MEDICAL CARE IF:  You develop a severe headache, blurred or changing vision, or confusion.  You have unusual weakness or numbness, or a faint feeling.  You have severe chest or abdominal pain, vomiting, or breathing problems. MAKE SURE YOU:   Understand these instructions.  Will watch your condition.  Will get  help right away if you are not doing well or get worse. Document Released: 01/02/2005 Document Revised: 03/27/2011 Document Reviewed: 08/23/2007 [facility] Patient Information 2014 [place].  DASH Diet The DASH diet stands for "Dietary Approaches to Stop Hypertension." It is a healthy eating plan that has been shown to reduce high blood pressure (hypertension) in as little as 14 days, while also possibly providing  other significant health benefits. These other health benefits include reducing the risk of breast cancer after menopause and reducing the risk of type 2 diabetes, heart disease, colon cancer, and stroke. Health benefits also include weight loss and slowing kidney failure in patients with chronic kidney disease.  DIET GUIDELINES  Limit salt (sodium). Your diet should contain less than 1500 mg of sodium daily.  Limit refined or processed carbohydrates. Your diet should include mostly whole grains. Desserts and added sugars should be used sparingly.  Include small amounts of heart-healthy fats. These types of fats include nuts, oils, and tub margarine. Limit saturated and trans fats. These fats have been shown to be harmful in the body. CHOOSING FOODS  The following food groups are based on a 2000 calorie diet. See your Registered Dietitian for individual calorie needs. Grains and Grain Products (6 to 8 servings daily)  Eat More Often: Whole-wheat bread, brown rice, whole-grain or wheat pasta, quinoa, popcorn without added fat or salt (air popped).  Eat Less Often: White bread, white pasta, white rice, cornbread. Vegetables (4 to 5 servings daily)  Eat More Often: Fresh, frozen, and canned vegetables. Vegetables may be raw, steamed, roasted, or grilled with a minimal amount of fat.  Eat Less Often/Avoid: Creamed or fried vegetables. Vegetables in a cheese sauce. Fruit (4 to 5 servings daily)  Eat More Often: All fresh, canned (in natural juice), or frozen fruits. Dried fruits without added sugar. One hundred percent fruit juice ( cup [237 mL] daily).  Eat Less Often: Dried fruits with added sugar. Canned fruit in light or heavy syrup. [employer], Fish, and Poultry (2 servings or less daily. One serving is 3 to 4 oz [85-114 g]).  Eat More Often: Ninety percent or leaner ground beef, tenderloin, sirloin. Round cuts of beef, chicken breast, [place] breast. All fish. Grill, bake, or broil your  meat. Nothing should be fried.  Eat Less Often/Avoid: Fatty cuts of meat, [place], or chicken leg, thigh, or wing. Fried cuts of meat or fish. Dairy (2 to 3 servings)  Eat More Often: Low-fat or fat-free milk, low-fat plain or light yogurt, reduced-fat or part-skim cheese.  Eat Less Often/Avoid: Milk (whole, 2%).Whole milk yogurt. Full-fat cheeses. Nuts, Seeds, and Legumes (4 to 5 servings per week)  Eat More Often: All without added salt.  Eat Less Often/Avoid: Salted nuts and seeds, canned beans with added salt. Fats and Sweets (limited)  Eat More Often: Vegetable oils, tub margarines without trans fats, sugar-free gelatin. Mayonnaise and salad dressings.  Eat Less Often/Avoid: Coconut oils, palm oils, butter, stick margarine, cream, half and half, cookies, candy, pie. FOR MORE INFORMATION The Dash Diet Eating Plan: www.dashdiet.org Document Released: 12/22/2010 Document Revised: 03/27/2011 Document Reviewed: 12/22/2010 [facility] Patient Information 2014 [place], [place].  Gout Gout is an inflammatory arthritis caused by a buildup of uric acid crystals in the joints. Uric acid is a chemical that is normally present in the blood. When the level of uric acid in the blood is too high it can form crystals that deposit in your joints and tissues. This causes joint redness, soreness, and  swelling (inflammation). Repeat attacks are common. Over time, uric acid crystals can form into masses (tophi) near a joint, destroying bone and causing disfigurement. Gout is treatable and often preventable. CAUSES  The disease begins with elevated levels of uric acid in the blood. Uric acid is produced by your body when it breaks down a naturally found substance called purines. Certain foods you eat, such as meats and fish, contain high amounts of purines. Causes of an elevated uric acid level include:  Being passed down from parent to child (heredity).  Diseases that cause increased uric acid production  (such as obesity, psoriasis, and certain cancers).  Excessive alcohol use.  Diet, especially diets rich in meat and seafood.  Medicines, including certain cancer-fighting medicines (chemotherapy), water pills (diuretics), and aspirin.  Chronic kidney disease. The kidneys are no longer able to remove uric acid well.  Problems with metabolism. Conditions strongly associated with gout include:  Obesity.  High blood pressure.  High cholesterol.  Diabetes. Not everyone with elevated uric acid levels gets gout. It is not understood why some people get gout and others do not. Surgery, joint injury, and eating too much of certain foods are some of the factors that can lead to gout attacks. SYMPTOMS   An attack of gout comes on quickly. It causes intense pain with redness, swelling, and warmth in a joint.  Fever can occur.  Often, only one joint is involved. Certain joints are more commonly involved:  Base of the big toe.  Knee.  Ankle.  Wrist.  Finger. Without treatment, an attack usually goes away in a few days to weeks. Between attacks, you usually will not have symptoms, which is different from many other forms of arthritis. DIAGNOSIS  Your caregiver will suspect gout based on your symptoms and exam. In some cases, tests may be recommended. The tests may include:  Blood tests.  Urine tests.  X-rays.  Joint fluid exam. This exam requires a needle to remove fluid from the joint (arthrocentesis). Using a microscope, gout is confirmed when uric acid crystals are seen in the joint fluid. TREATMENT  There are two phases to gout treatment: treating the sudden onset (acute) attack and preventing attacks (prophylaxis).  Treatment of an Acute Attack.  Medicines are used. These include anti-inflammatory medicines or steroid medicines.  An injection of steroid medicine into the affected joint is sometimes necessary.  The painful joint is rested. Movement can worsen the  arthritis.  You may use warm or cold treatments on painful joints, depending which works best for you.  Treatment to Prevent Attacks.  If you suffer from frequent gout attacks, your caregiver may advise preventive medicine. These medicines are started after the acute attack subsides. These medicines either help your kidneys eliminate uric acid from your body or decrease your uric acid production. You may need to stay on these medicines for a very long time.  The early phase of treatment with preventive medicine can be associated with an increase in acute gout attacks. For this reason, during the first few months of treatment, your caregiver may also advise you to take medicines usually used for acute gout treatment. Be sure you understand your caregiver's directions. Your caregiver may make several adjustments to your medicine dose before these medicines are effective.  Discuss dietary treatment with your caregiver or dietitian. Alcohol and drinks high in sugar and fructose and foods such as meat, poultry, and seafood can increase uric acid levels. Your caregiver or dietician can advise you on  drinks and foods that should be limited. HOME CARE INSTRUCTIONS   Do not take aspirin to relieve pain. This raises uric acid levels.  Only take over-the-counter or prescription medicines for pain, discomfort, or fever as directed by your caregiver.  Rest the joint as much as possible. When in bed, keep sheets and blankets off painful areas.  Keep the affected joint raised (elevated).  Apply warm or cold treatments to painful joints. Use of warm or cold treatments depends on which works best for you.  Use crutches if the painful joint is in your leg.  Drink enough fluids to keep your urine clear or pale yellow. This helps your body get rid of uric acid. Limit alcohol, sugary drinks, and fructose drinks.  Follow your dietary instructions. Pay careful attention to the amount of protein you eat. Your  daily diet should emphasize fruits, vegetables, whole grains, and fat-free or low-fat milk products. Discuss the use of coffee, vitamin C, and cherries with your caregiver or dietician. These may be helpful in lowering uric acid levels.  Maintain a healthy body weight. SEEK MEDICAL CARE IF:   You develop diarrhea, vomiting, or any side effects from medicines.  You do not feel better in 24 hours, or you are getting worse. SEEK IMMEDIATE MEDICAL CARE IF:   Your joint becomes suddenly more tender, and you have chills or a fever. MAKE SURE YOU:   Understand these instructions.  Will watch your condition.  Will get help right away if you are not doing well or get worse. Document Released: 12/31/1999 Document Revised: 04/29/2012 Document Reviewed: 08/16/2011 [facility] Patient Information 2014 [place].

## 2012-12-18 NOTE — Progress Notes (Signed)
Patient ID: Frank Coffey, male   DOB: 1980/04/07, 32 y.o.   MRN: [id number]  Patient presents to clinic today to establish care.    Acute Concerns: Patient complains of gout "flare-up" over the past week.  Has had one other instance of gout. Both episodes have occurred in the great toe of the right foot.  Pain is worse with weight bearing and ambulation.  Patient denies fever, chills, general malaise.  Endorses warmth and redness of affected site.   Chronic Issues: (1) Hypertension -- Patient's BP elevated at 156/118.  Is prescribed Carvedilol, Olmesartan but has been out of medications.  Denies headache, chest pain, palpitations, shortness of breath, vision changes.  Patient is blind in right eye.  Patient is also on Hydralazine, Aldactone and Lasix for CHF.  Patient followed by Cardiology at [facility], but is overdue for follow-up.  Will need records from Cardiology.  (2) CHF -- Diagnosed several years ago.  Patient on multi-drug regimen for symptom control.  Needs refills.  Denies PND.  HAs 1 pillow orthopnea.  Denies leg swelling.  Last BNP in our system was within normal limits.  Patient endorses having last Echo this past year during an ER visit in [place].  Again, patient is followed by Cardiology at [facility], but is overdue for an appointment.  (3) Obstructive Sleep Apnea -- Diagnosed with polysomnography many years ago.  Patient has a CPAP at home but has not used it in years, stating it does not fit correctly.  Would like to be set up for a repeat sleep study.  (4) CKD, Stage III -- reports being followed by [facility] nephrologist in the past, but due to work he missed a few appointments and they refused to reschedule him.  Will need labs and referral to Nephrology.  Will also need records from previous PCP and nephrologist.  (5) Hyperlipidemia -- endorses history of elevated cholesterol in the past.  Has significant family history of HLD.  Has Rx for atorvastatin but has not taken  it because he states "they did not tell me what it was for".  Encouraged patient to begin medication.  Will need to return for fasting labs.  Health Maintenance: Dental -- UTD Vision -- Overdue Immunizations -- Requests flu shot.  Tetanus in 2005.   Past Medical History  Diagnosis Date  . CHF (congestive heart failure)   . 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 Chronic kidney disease, stage 3, mod decreased GFR   . Gout   . Hyperlipidemia   . Obesity     No current outpatient prescriptions on file prior to visit.   No current facility-administered medications on file prior to visit.    No Known Allergies  Family History  Problem Relation Age of Onset  . Hypertension Mother   . Diabetes Neg Hx   . Heart attack Neg Hx   . Hyperlipidemia Mother   . Sudden death Neg Hx   . Heart disease Mother   . Hypertension Maternal Grandmother   . Stroke Maternal Grandfather   . Stroke Maternal Grandmother   . Hypertension Maternal Grandfather   . Heart disease Maternal Grandmother   . Heart disease Maternal Grandfather   . Hyperlipidemia Maternal Grandmother   . Hyperlipidemia Maternal Grandfather   . Cancer - Other Paternal Grandmother   . Alzheimer's disease Maternal Grandfather   . Healthy Brother     x2  . Healthy Sister     x2  .  Healthy Son     x2  . Healthy Daughter     x1    History   Social History  . Marital Status: Legally Separated    Spouse Name: N/A    Number of Children: N/A  . Years of Education: N/A   Social History Main Topics  . Smoking status: Never Smoker   . Smokeless tobacco: Never Used  . Alcohol Use: Yes     Comment: socially -- once a month  . Drug Use: No  . Sexual Activity: None   Other Topics Concern  . None   Social History Narrative  . None   Review of Systems  Constitutional: Negative for fever and weight loss.  HENT: Negative for ear discharge, ear pain, hearing loss and  tinnitus.   Eyes: Negative for blurred vision, double vision, photophobia and pain.  Respiratory: Negative for cough and shortness of breath.   Cardiovascular: Negative for chest pain and palpitations.  Gastrointestinal: Negative for heartburn, nausea, vomiting, abdominal pain, diarrhea, constipation, blood in stool and melena.  Genitourinary: Negative for dysuria, urgency, frequency, hematuria and flank pain.  Musculoskeletal: Positive for joint pain.  Neurological: Negative for dizziness, seizures, loss of consciousness and headaches.  Endo/Heme/Allergies: Negative for environmental allergies.  Psychiatric/Behavioral: Negative for depression, suicidal ideas, hallucinations and substance abuse. The patient is not nervous/anxious and does not have insomnia.    Filed Vitals:   12/18/12 1653  BP: 156/118  Pulse: 85  Temp: 98 F (36.7 C)  Resp: 20   Physical Exam  Vitals reviewed. Constitutional: He is oriented to person, place, and time and well-developed, well-nourished, and in no distress.  Morbidly obese, pleasant AA gentleman in no acute distress.  HENT:  Head: Normocephalic and atraumatic.  Right Ear: External ear normal.  Left Ear: External ear normal.  Nose: Nose normal.  Mouth/Throat: Oropharynx is clear and moist.  TM within normal limits bilaterally.  Eyes: Conjunctivae and EOM are normal. Pupils are equal, round, and reactive to light. No scleral icterus.  Neck: Neck supple.  Cardiovascular: Normal rate, regular rhythm, normal heart sounds and intact distal pulses.   No JVD appreciated on examination.  Pulmonary/Chest: Effort normal and breath sounds normal. No respiratory distress. He has no wheezes. He has no rales. He exhibits no tenderness.  Abdominal: Soft. Bowel sounds are normal. He exhibits no mass. There is no tenderness. There is no rebound and no guarding.  Musculoskeletal:  Erythema, swelling and warmth of great toe joint of R foot noted on exam, consistent  with gout flare.  Lymphadenopathy:    He has no cervical adenopathy.  Neurological: He is alert and oriented to person, place, and time.  Skin: Skin is warm and dry.  Psychiatric: Affect normal.   Assessment/Plan: CHF (congestive heart failure) Medicines refilled.  Patient to return for labs.  Will obtain records from Cardiology.  Patient to schedule follow-up with Cardiologist, because he is overdue.  Essential hypertension, benign Medications refilled.  Patient to restart medications.  DASH diet handout given.  Will order polysomnography for sleep apnea.  Follow-up in 2 weeks.  CKD (chronic kidney disease) stage 3, GFR 30-59 ml/min Reported by patient. Patient to return for labs. Referral made to nephrology.  Encounter to establish care Medical history reviewed.  Medications refilled.  Patient to return for fasting labs. Flu shot given by nursing staff.  Gout Rx Colchicine.  Handout on gout given to patient.  If flare-up recurs, will need labs and possible prophylactic medications.  OSA (obstructive sleep apnea) Patient reports diagnosis of OSA with polysomnography many years ago.  Not currently on CPAP.  Has HTN.  Will order polysomnography study.

## 2012-12-19 ENCOUNTER — Telehealth: Payer: Self-pay | Admitting: Physician Assistant

## 2012-12-19 DIAGNOSIS — N186 End stage renal disease: Secondary | ICD-10-CM | POA: Insufficient documentation

## 2012-12-19 DIAGNOSIS — Z7689 Persons encountering health services in other specified circumstances: Secondary | ICD-10-CM | POA: Insufficient documentation

## 2012-12-19 DIAGNOSIS — N184 Chronic kidney disease, stage 4 (severe): Secondary | ICD-10-CM | POA: Insufficient documentation

## 2012-12-19 DIAGNOSIS — I509 Heart failure, unspecified: Secondary | ICD-10-CM | POA: Insufficient documentation

## 2012-12-19 DIAGNOSIS — Z992 Dependence on renal dialysis: Secondary | ICD-10-CM | POA: Insufficient documentation

## 2012-12-19 DIAGNOSIS — M109 Gout, unspecified: Secondary | ICD-10-CM | POA: Insufficient documentation

## 2012-12-19 DIAGNOSIS — I129 Hypertensive chronic kidney disease with stage 1 through stage 4 chronic kidney disease, or unspecified chronic kidney disease: Secondary | ICD-10-CM | POA: Insufficient documentation

## 2012-12-19 DIAGNOSIS — I5022 Chronic systolic (congestive) heart failure: Secondary | ICD-10-CM | POA: Insufficient documentation

## 2012-12-19 DIAGNOSIS — I132 Hypertensive heart and chronic kidney disease with heart failure and with stage 5 chronic kidney disease, or end stage renal disease: Secondary | ICD-10-CM | POA: Insufficient documentation

## 2012-12-19 DIAGNOSIS — G4733 Obstructive sleep apnea (adult) (pediatric): Secondary | ICD-10-CM | POA: Insufficient documentation

## 2012-12-19 NOTE — Assessment & Plan Note (Signed)
>>  ASSESSMENT AND PLAN FOR BENIGN HYPERTENSION WITH CHRONIC KIDNEY DISEASE, STAGE IV (HCC) WRITTEN ON 12/19/2012  7:49 AM BY Jonn Nett C, PA-C  Medications refilled.  Patient to restart medications.  DASH diet handout given.  Will order polysomnography for sleep apnea.  Follow-up in 2 weeks.

## 2012-12-19 NOTE — Assessment & Plan Note (Signed)
Rx Colchicine.  Handout on gout given to patient.  If flare-up recurs, will need labs and possible prophylactic medications.

## 2012-12-19 NOTE — Telephone Encounter (Signed)
Received medical records from [facility]. Smitty [id number]

## 2012-12-19 NOTE — Assessment & Plan Note (Signed)
Medications refilled.  Patient to restart medications.  DASH diet handout given.  Will order polysomnography for sleep apnea.  Follow-up in 2 weeks.

## 2012-12-19 NOTE — Assessment & Plan Note (Signed)
Medicines refilled.  Patient to return for labs.  Will obtain records from Cardiology.  Patient to schedule follow-up with Cardiologist, because he is overdue.

## 2012-12-19 NOTE — Assessment & Plan Note (Addendum)
Medical history reviewed.  Medications refilled.  Patient to return for fasting labs. Flu shot given by nursing staff.

## 2012-12-19 NOTE — Assessment & Plan Note (Signed)
Reported by patient. Patient to return for labs. Referral made to nephrology.

## 2012-12-19 NOTE — Assessment & Plan Note (Signed)
Patient reports diagnosis of OSA with polysomnography many years ago.  Not currently on CPAP.  Has HTN.  Will order polysomnography study.

## 2012-12-20 ENCOUNTER — Other Ambulatory Visit: Payer: Self-pay | Admitting: Physician Assistant

## 2012-12-20 DIAGNOSIS — Z8639 Personal history of other endocrine, nutritional and metabolic disease: Secondary | ICD-10-CM

## 2012-12-20 DIAGNOSIS — G4733 Obstructive sleep apnea (adult) (pediatric): Secondary | ICD-10-CM

## 2012-12-20 NOTE — Addendum Note (Signed)
Addended by: Raiford Noble on: 12/20/2012 08:38 AM   Modules accepted: Orders

## 2012-12-24 ENCOUNTER — Institutional Professional Consult (permissible substitution): Payer: Self-pay | Admitting: Neurology

## 2013-01-01 ENCOUNTER — Ambulatory Visit: Payer: 59 | Admitting: Physician Assistant

## 2013-01-01 DIAGNOSIS — Z0289 Encounter for other administrative examinations: Secondary | ICD-10-CM

## 2013-01-16 ENCOUNTER — Encounter (HOSPITAL_BASED_OUTPATIENT_CLINIC_OR_DEPARTMENT_OTHER): Payer: Self-pay | Admitting: Emergency Medicine

## 2013-01-16 ENCOUNTER — Emergency Department (HOSPITAL_BASED_OUTPATIENT_CLINIC_OR_DEPARTMENT_OTHER)
Admission: EM | Admit: 2013-01-16 | Discharge: 2013-01-17 | Disposition: A | Discharge status: Home / Self Care | Payer: 59 | Attending: Emergency Medicine | Admitting: Emergency Medicine

## 2013-01-16 DIAGNOSIS — L089 Local infection of the skin and subcutaneous tissue, unspecified: Secondary | ICD-10-CM | POA: Insufficient documentation

## 2013-01-16 DIAGNOSIS — B353 Tinea pedis: Secondary | ICD-10-CM

## 2013-01-16 DIAGNOSIS — N183 Chronic kidney disease, stage 3 unspecified: Secondary | ICD-10-CM | POA: Insufficient documentation

## 2013-01-16 DIAGNOSIS — M109 Gout, unspecified: Secondary | ICD-10-CM | POA: Insufficient documentation

## 2013-01-16 DIAGNOSIS — Z79899 Other long term (current) drug therapy: Secondary | ICD-10-CM | POA: Insufficient documentation

## 2013-01-16 DIAGNOSIS — E669 Obesity, unspecified: Secondary | ICD-10-CM | POA: Insufficient documentation

## 2013-01-16 DIAGNOSIS — I509 Heart failure, unspecified: Secondary | ICD-10-CM | POA: Insufficient documentation

## 2013-01-16 DIAGNOSIS — N289 Disorder of kidney and ureter, unspecified: Secondary | ICD-10-CM

## 2013-01-16 DIAGNOSIS — I130 Hypertensive heart and chronic kidney disease with heart failure and stage 1 through stage 4 chronic kidney disease, or unspecified chronic kidney disease: Secondary | ICD-10-CM | POA: Insufficient documentation

## 2013-01-16 DIAGNOSIS — N189 Chronic kidney disease, unspecified: Secondary | ICD-10-CM

## 2013-01-16 LAB — CBC WITH DIFFERENTIAL/PLATELET
BASOS ABS: 0 10*3/uL (ref 0.0–0.1)
BASOS PCT: 0 % (ref 0–1)
Eosinophils Absolute: 0.3 10*3/uL (ref 0.0–0.7)
Eosinophils Relative: 3 % (ref 0–5)
HEMATOCRIT: 40.9 % (ref 39.0–52.0)
Hemoglobin: 13 g/dL (ref 13.0–17.0)
Lymphocytes Relative: 16 % (ref 12–46)
Lymphs Abs: 1.6 10*3/uL (ref 0.7–4.0)
MCH: 23.9 pg — ABNORMAL LOW (ref 26.0–34.0)
MCHC: 31.8 g/dL (ref 30.0–36.0)
MCV: 75.3 fL — ABNORMAL LOW (ref 78.0–100.0)
Monocytes Absolute: 1.4 10*3/uL — ABNORMAL HIGH (ref 0.1–1.0)
Monocytes Relative: 14 % — ABNORMAL HIGH (ref 3–12)
NEUTROS ABS: 6.5 10*3/uL (ref 1.7–7.7)
NEUTROS PCT: 67 % (ref 43–77)
PLATELETS: 301 10*3/uL (ref 150–400)
RBC: 5.43 MIL/uL (ref 4.22–5.81)
RDW: 15.3 % (ref 11.5–15.5)
WBC: 9.8 10*3/uL (ref 4.0–10.5)

## 2013-01-16 LAB — COMPREHENSIVE METABOLIC PANEL
ALBUMIN: 3.5 g/dL (ref 3.5–5.2)
ALT: 38 U/L (ref 0–53)
AST: 28 U/L (ref 0–37)
Alkaline Phosphatase: 80 U/L (ref 39–117)
BUN: 29 mg/dL — ABNORMAL HIGH (ref 6–23)
CO2: 27 meq/L (ref 19–32)
CREATININE: 3.2 mg/dL — AB (ref 0.50–1.35)
Calcium: 9.6 mg/dL (ref 8.4–10.5)
Chloride: 100 mEq/L (ref 96–112)
GFR calc Af Amer: 27 mL/min — ABNORMAL LOW (ref >90)
GFR calc non Af Amer: 24 mL/min — ABNORMAL LOW (ref >90)
Glucose, Bld: 97 mg/dL (ref 70–99)
POTASSIUM: 3.8 meq/L (ref 3.7–5.3)
Sodium: 141 mEq/L (ref 137–147)
Total Bilirubin: 0.4 mg/dL (ref 0.3–1.2)
Total Protein: 8.1 g/dL (ref 6.0–8.3)

## 2013-01-16 NOTE — ED Notes (Addendum)
Infection to his right foot. Has been using otc athletes foot medication with no improvement. BP noted. Pt states he has not taken his BP medication in a couple of days and it usually runs very high without medication

## 2013-01-16 NOTE — ED Notes (Signed)
Due to pt's bp being elevated and wounds to bilateral feet, patient's acuity increased to level 3

## 2013-01-16 NOTE — ED Provider Notes (Signed)
CSN: [id number]     Arrival date & time 01/16/13  2137 History  This chart was scribed for Elena Davia Alfonso Patten, MD by Maree Erie, ED Scribe. The patient was seen in room MH10/MH10. Patient's care was started at 11:36 PM.    Chief Complaint  Patient presents with  . Rash    Patient is a 33 y.o. male presenting with rash. The history is provided by the patient. No language interpreter was used.  Rash Location:  Foot Foot rash location:  L toes and R toes Quality: draining, itchiness and weeping   Severity:  Moderate Onset quality:  Gradual Timing:  Constant Progression:  Worsening Chronicity:  New Context comment:  Athlete's foot Relieved by:  Nothing Ineffective treatments:  Anti-fungal cream Associated symptoms: no abdominal pain and no fever     HPI Comments: Frank Coffey is a 33 y.o. male stage three kidney failure, CHF, HTN and gout who presents to the Emergency Department complaining of a constant, painful, draining, worsening rash to his bilateral toes. He states that he has been using Athlete Foot spray without relief. He denies fever at home. He denies allergies to any medications. He states he is up to date on his Tetanus vaccination. He is seen by the Podiatrist upstairs.   Past Medical History  Diagnosis Date  . CHF (congestive heart failure)   . 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 Chronic kidney disease, stage 3, mod decreased GFR   . Gout   . Hyperlipidemia   . Obesity    Past Surgical History  Procedure Laterality Date  . Cholecystectomy    . Retinal detachment repair w/ scleral buckle le      Left   Family History  Problem Relation Age of Onset  . Hypertension Mother   . Diabetes Neg Hx   . Heart attack Neg Hx   . Hyperlipidemia Mother   . Sudden death Neg Hx   . Heart disease Mother   . Hypertension Maternal Grandmother   . Stroke Maternal Grandfather   . Stroke Maternal Grandmother   .  Hypertension Maternal Grandfather   . Heart disease Maternal Grandmother   . Heart disease Maternal Grandfather   . Hyperlipidemia Maternal Grandmother   . Hyperlipidemia Maternal Grandfather   . Cancer - Other Paternal Grandmother   . Alzheimer's disease Maternal Grandfather   . Healthy Brother     x2  . Healthy Sister     x2  . Healthy Son     x2  . Healthy Daughter     x1   History  Substance Use Topics  . Smoking status: Never Smoker   . Smokeless tobacco: Never Used  . Alcohol Use: Yes     Comment: socially -- once a month    Review of Systems  Constitutional: Negative for fever.  Gastrointestinal: Negative for abdominal pain.  Skin: Positive for rash.  All other systems reviewed and are negative.    Allergies  Review of patient's allergies indicates no known allergies.  Home Medications   Current Outpatient Rx  Name  Route  Sig  Dispense  Refill  . carvedilol (COREG) 25 MG tablet   Oral   Take 1 tablet (25 mg total) by mouth 2 (two) times daily with a meal.   60 tablet   1   . colchicine 0.6 MG tablet      Take 2 tablets.  Then take 1 tablet 1 hour  later.   3 tablet   0   . furosemide (LASIX) 40 MG tablet   Oral   Take 1 tablet (40 mg total) by mouth daily.   30 tablet   1   . hydrALAZINE (APRESOLINE) 100 MG tablet   Oral   Take 1 tablet (100 mg total) by mouth 3 (three) times daily.   90 tablet   1   . NON FORMULARY   Oral   Take 2 capsules by mouth daily. Colon Clenz Herbal         . olmesartan (BENICAR) 40 MG tablet   Oral   Take 1 tablet (40 mg total) by mouth daily.   30 tablet   1   . spironolactone (ALDACTONE) 25 MG tablet   Oral   Take 1 tablet (25 mg total) by mouth daily.   30 tablet   1    Triage Vitals: BP 222/135  Pulse 94  Temp(Src) 99 F (37.2 C) (Oral)  Resp 20  Ht 5' 8.5" (1.74 m)  Wt 337 lb (152.862 kg)  BMI 50.49 kg/m2  SpO2 99%  Physical Exam  Nursing note and vitals reviewed. Constitutional: He is  oriented to person, place, and time. He appears well-developed and well-nourished.  HENT:  Head: Normocephalic and atraumatic.  Mouth/Throat: Oropharynx is clear and moist and mucous membranes are normal. No oropharyngeal exudate.  Eyes: Pupils are equal, round, and reactive to light.  Neck: Normal range of motion. Neck supple.  Cardiovascular: Normal rate, regular rhythm and intact distal pulses.   Bilateral DP 2+. Cap refill less than three seconds.   Pulmonary/Chest: Effort normal and breath sounds normal. No respiratory distress. He has no wheezes. He has no rales.  Abdominal: Soft. Bowel sounds are normal. There is no tenderness. There is no rebound and no guarding.  Musculoskeletal: Normal range of motion.  Neurological: He is alert and oriented to person, place, and time.  Skin: Skin is warm. No rash noted. No erythema.  Mild skin breakdown between third and fourth toe of left foot. Achilles intact bilaterally. NVI intact bilateral feet. Crusting between second and third and third and fourth toes right foot. Plantar fascia intact.   Psychiatric: He has a normal mood and affect.    ED Course  Procedures (including critical care time)  DIAGNOSTIC STUDIES: Oxygen Saturation is 99% on room air, normal by my interpretation.    COORDINATION OF CARE: 11:44 PM -Will perform wound care and discharge with instruction to follow up with Podiatrist. Patient verbalizes understanding and agrees with treatment plan.    Labs Review Labs Reviewed  CBC WITH DIFFERENTIAL  COMPREHENSIVE METABOLIC PANEL   Imaging Review No results found.  EKG Interpretation   None       MDM  No diagnosis found. Infection of athletes foot between toes without abscess nor cellulitis.  Will treat with warm dial soaks, doxycycline and keflex, neosporin BID wrappping in clean bandages close follow up with your PMD and continue athletes foot treatment.  No signs of systemic infection.  Patient verbalizes  understanding of all instructions and agrees to follow up  I personally performed the services described in this documentation, which was scribed in my presence. The recorded information has been reviewed and is accurate.    Carlisle Beers, MD 01/17/13 [phone]

## 2013-01-17 ENCOUNTER — Emergency Department (HOSPITAL_BASED_OUTPATIENT_CLINIC_OR_DEPARTMENT_OTHER): Payer: 59

## 2013-01-17 MED ORDER — CEPHALEXIN 500 MG PO CAPS: 500.0000 mg | ORAL_CAPSULE | Freq: Four times a day (QID) | ORAL | Status: DC

## 2013-01-17 MED ORDER — LIDOCAINE HCL (PF) 1 % IJ SOLN
INTRAMUSCULAR | Status: AC
  Administered 2013-01-17: 5 mL
  Filled 2013-01-17: qty 5

## 2013-01-17 MED ORDER — DOXYCYCLINE HYCLATE 100 MG PO CAPS: 100.0000 mg | ORAL_CAPSULE | Freq: Two times a day (BID) | ORAL | Status: DC

## 2013-01-17 MED ORDER — CEFTRIAXONE SODIUM 1 G IJ SOLR
1.0000 g | Freq: Once | INTRAMUSCULAR | Status: AC
  Administered 2013-01-17: 1 g via INTRAMUSCULAR
  Filled 2013-01-17: qty 10

## 2013-01-17 NOTE — ED Notes (Signed)
I had patient soak feet in saline/iodine solution, then after 20 minutes, I dried patients feet with towel. I then applied thick slathering layer of bacitracin over entire surface of feet and between toes using cotton swab. I then placed folded 2x2's between toes and then completed a bulky dressing on both feet, secured by tape and applied x-large socks. I advised patient not to wear same sandals again until he can wash them in soap and water. I advised patient to keep feet dry and wrapped per nurse/doctor final instructions. I took discharge vitals for nurse.

## 2013-01-17 NOTE — Discharge Instructions (Signed)
Athlete's Foot Athlete's foot (tinea pedis) is a fungal infection of the skin on the feet. It often occurs on the skin between the toes or underneath the toes. It can also occur on the soles of the feet. Athlete's foot is more likely to occur in hot, humid weather. Not washing your feet or changing your socks often enough can contribute to athlete's foot. The infection can spread from person to person (contagious). CAUSES Athlete's foot is caused by a fungus. This fungus thrives in warm, moist places. Most people get athlete's foot by sharing shower stalls, towels, and wet floors with an infected person. People with weakened immune systems, including those with diabetes, may be more likely to get athlete's foot. SYMPTOMS   Itchy areas between the toes or on the soles of the feet.  White, flaky, or scaly areas between the toes or on the soles of the feet.  Tiny, intensely itchy blisters between the toes or on the soles of the feet.  Tiny cuts on the skin. These cuts can develop a bacterial infection.  Thick or discolored toenails. DIAGNOSIS  Your caregiver can usually tell what the problem is by doing a physical exam. Your caregiver may also take a skin sample from the rash area. The skin sample may be examined under a microscope, or it may be tested to see if fungus will grow in the sample. A sample may also be taken from your toenail for testing. TREATMENT  Over-the-counter and prescription medicines can be used to kill the fungus. These medicines are available as powders or creams. Your caregiver can suggest medicines for you. Fungal infections respond slowly to treatment. You may need to continue using your medicine for several weeks. PREVENTION   Do not share towels.  Wear sandals in wet areas, such as shared locker rooms and shared showers.  Keep your feet dry. Wear shoes that allow air to circulate. Wear cotton or wool socks. HOME CARE INSTRUCTIONS   Take medicines as directed by  your caregiver. Do not use steroid creams on athlete's foot.  Keep your feet clean and cool. Wash your feet daily and dry them thoroughly, especially between your toes.  Change your socks every day. Wear cotton or wool socks. In hot climates, you may need to change your socks 2 to 3 times per day.  Wear sandals or canvas tennis shoes with good air circulation.  If you have blisters, soak your feet in Burow's solution or Epsom salts for 20 to 30 minutes, 2 times a day to dry out the blisters. Make sure you dry your feet thoroughly afterward. SEEK MEDICAL CARE IF:   You have a fever.  You have swelling, soreness, warmth, or redness in your foot.  You are not getting better after 7 days of treatment.  You are not completely cured after 30 days.  You have any problems caused by your medicines. MAKE SURE YOU:   Understand these instructions.  Will watch your condition.  Will get help right away if you are not doing well or get worse. Document Released: 12/31/1999 Document Revised: 03/27/2011 Document Reviewed: 10/21/2010 [employer] Patient Information 2014 [employer].  

## 2013-02-06 ENCOUNTER — Ambulatory Visit: Payer: Managed Care, Other (non HMO) | Admitting: Physician Assistant

## 2013-02-07 ENCOUNTER — Ambulatory Visit: Payer: 59 | Admitting: Physician Assistant

## 2013-02-07 ENCOUNTER — Telehealth: Payer: Self-pay | Admitting: Physician Assistant

## 2013-02-07 DIAGNOSIS — I509 Heart failure, unspecified: Secondary | ICD-10-CM

## 2013-02-07 DIAGNOSIS — Z8669 Personal history of other diseases of the nervous system and sense organs: Secondary | ICD-10-CM | POA: Insufficient documentation

## 2013-02-07 MED ORDER — FUROSEMIDE 40 MG PO TABS: 40.0000 mg | ORAL_TABLET | Freq: Every day | ORAL | Status: DC

## 2013-02-07 NOTE — Telephone Encounter (Signed)
Refill granted.  Sent to [facility] on [address]

## 2013-02-07 NOTE — Telephone Encounter (Signed)
He rescheduled his appointment to Wednesday next week.  He is out of his lasix 40 mg  Please send enough to get him through till his appointment on Wednesday to [facility] in [facility]

## 2013-02-12 ENCOUNTER — Other Ambulatory Visit: Payer: Self-pay | Admitting: Physician Assistant

## 2013-02-12 ENCOUNTER — Encounter: Payer: Self-pay | Admitting: Physician Assistant

## 2013-02-12 ENCOUNTER — Encounter: Payer: Self-pay | Admitting: *Deleted

## 2013-02-12 ENCOUNTER — Ambulatory Visit (INDEPENDENT_AMBULATORY_CARE_PROVIDER_SITE_OTHER): Payer: 59 | Admitting: Physician Assistant

## 2013-02-12 VITALS — BP 164/122 | HR 98 | Temp 98.7°F | Resp 18 | Ht 68.0 in | Wt 345.8 lb

## 2013-02-12 DIAGNOSIS — G4733 Obstructive sleep apnea (adult) (pediatric): Secondary | ICD-10-CM

## 2013-02-12 DIAGNOSIS — I10 Essential (primary) hypertension: Secondary | ICD-10-CM

## 2013-02-12 DIAGNOSIS — N183 Chronic kidney disease, stage 3 unspecified: Secondary | ICD-10-CM

## 2013-02-12 DIAGNOSIS — I509 Heart failure, unspecified: Secondary | ICD-10-CM

## 2013-02-12 NOTE — Assessment & Plan Note (Signed)
Will replace order for sleep study.

## 2013-02-12 NOTE — Assessment & Plan Note (Signed)
Patient not taking Benicar.  Patient giving samples of medication to take.  Instructed to pick up his prescription.  Continue other medications as prescribed.  Discussed risk of hyperkalemia with co-administration of ARBs with Potassium-Sparing diuretics.  Patient endorses being placed on medication combo several years ago by Cardiologist.  Will continue current regimen for now. Will check BMP today and repeat in 3-4 weeks.

## 2013-02-12 NOTE — Assessment & Plan Note (Signed)
Patient to schedule appointment with Cardiologist.  Reiterated the importance of scheduled follow-up.  Continue current medications.

## 2013-02-12 NOTE — Progress Notes (Signed)
Patient presents to clinic today c/o for follow-up of HTN.  Paitent's BP is 162/120 in clinic today. Patient denies chest pain, palpitations, vision change, LH or dizziness.  Endorses occasional headaches but denies having headache at time of visit.  Patient endorses taking his Lasix and Spironolactone for CHF symptoms.  Is taking his hydralazine and carvedilol as prescribed.  Just took medication 10 minutes prior to visit.  Patient is prescribed 40 mg Benicar but has not taken his medication in several months.  At his new patient appointment in December, Benicar was refilled. Patient states the medication was not at the pharmacy.  Pharmacy confirmed they received medication Rx.  Patient was also instructed to follow-up with his Cardiologist, Dr. Chu(sp?)but has not done so.    Patient had requested sleep study at last visit for hx of OSA in the past. Study was set-up but patient canceled his appointment and did not reschedule.  Will need new order placed.  Patient also with history of Stage II CKD without Nephrology follow-up.  Referral given at last visit but patient canceled his appointment.  He endorses he will call to reschedule appointment now that his busy work season is over.  Patient also was sent for labs after last visit.  Patient did not have labs drawn.  Patient will do so today.  Past Medical History  Diagnosis Date  . CHF (congestive heart failure)   . 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 Chronic kidney disease, stage 3, mod decreased GFR   . Gout   . Hyperlipidemia   . Obesity     Current Outpatient Prescriptions on File Prior to Visit  Medication Sig Dispense Refill  . carvedilol (COREG) 25 MG tablet Take 1 tablet (25 mg total) by mouth 2 (two) times daily with a meal.  60 tablet  1  . furosemide (LASIX) 40 MG tablet Take 1 tablet (40 mg total) by mouth daily.  30 tablet  1  . hydrALAZINE (APRESOLINE) 100 MG tablet Take 1 tablet  (100 mg total) by mouth 3 (three) times daily.  90 tablet  1  . NON FORMULARY Take 2 capsules by mouth daily. Colon Clenz Herbal      . spironolactone (ALDACTONE) 25 MG tablet Take 1 tablet (25 mg total) by mouth daily.  30 tablet  1  . olmesartan (BENICAR) 40 MG tablet Take 1 tablet (40 mg total) by mouth daily.  30 tablet  1   No current facility-administered medications on file prior to visit.    No Known Allergies  Family History  Problem Relation Age of Onset  . Hypertension Mother   . Diabetes Neg Hx   . Heart attack Neg Hx   . Hyperlipidemia Mother   . Sudden death Neg Hx   . Heart disease Mother   . Hypertension Maternal Grandmother   . Stroke Maternal Grandfather   . Stroke Maternal Grandmother   . Hypertension Maternal Grandfather   . Heart disease Maternal Grandmother   . Heart disease Maternal Grandfather   . Hyperlipidemia Maternal Grandmother   . Hyperlipidemia Maternal Grandfather   . Cancer - Other Paternal Grandmother   . Alzheimer's disease Maternal Grandfather   . Healthy Brother     x2  . Healthy Sister     x2  . Healthy Son     x2  . Healthy Daughter     x1    History   Social History  .  Marital Status: Legally Separated    Spouse Name: N/A    Number of Children: N/A  . Years of Education: N/A   Social History Main Topics  . Smoking status: Never Smoker   . Smokeless tobacco: Never Used  . Alcohol Use: Yes     Comment: socially -- once a month  . Drug Use: No  . Sexual Activity: None   Other Topics Concern  . None   Social History Narrative  . None   Review of Systems - See HPI.  All other ROS are negative.  Filed Vitals:   02/12/13 0734  BP: 164/122  Pulse:   Temp:   Resp:     Physical Exam  Vitals reviewed. Constitutional: He is oriented to person, place, and time and well-developed, well-nourished, and in no distress.  HENT:  Head: Normocephalic and atraumatic.  Eyes: Conjunctivae are normal. Pupils are equal, round,  and reactive to light.  Neck: Neck supple.  Cardiovascular: Normal rate, regular rhythm, normal heart sounds and intact distal pulses.   Pulmonary/Chest: Effort normal and breath sounds normal. No respiratory distress. He has no wheezes. He has no rales. He exhibits no tenderness.  Lymphadenopathy:    He has no cervical adenopathy.  Neurological: He is alert and oriented to person, place, and time. No cranial nerve deficit.  Skin: Skin is warm and dry. No rash noted.  Psychiatric: Affect normal.    Recent Results (from the past 2160 hour(s))  CBC WITH DIFFERENTIAL     Status: Abnormal   Collection Time    01/16/13 11:25 PM      Result Value Range   WBC 9.8  4.0 - 10.5 K/uL   RBC 5.43  4.22 - 5.81 MIL/uL   Hemoglobin 13.0  13.0 - 17.0 g/dL   HCT 40.9  39.0 - 52.0 %   MCV 75.3 (*) 78.0 - 100.0 fL   MCH 23.9 (*) 26.0 - 34.0 pg   MCHC 31.8  30.0 - 36.0 g/dL   RDW 15.3  11.5 - 15.5 %   Platelets 301  150 - 400 K/uL   Neutrophils Relative % 67  43 - 77 %   Neutro Abs 6.5  1.7 - 7.7 K/uL   Lymphocytes Relative 16  12 - 46 %   Lymphs Abs 1.6  0.7 - 4.0 K/uL   Monocytes Relative 14 (*) 3 - 12 %   Monocytes Absolute 1.4 (*) 0.1 - 1.0 K/uL   Eosinophils Relative 3  0 - 5 %   Eosinophils Absolute 0.3  0.0 - 0.7 K/uL   Basophils Relative 0  0 - 1 %   Basophils Absolute 0.0  0.0 - 0.1 K/uL  COMPREHENSIVE METABOLIC PANEL     Status: Abnormal   Collection Time    01/16/13 11:25 PM      Result Value Range   Sodium 141  137 - 147 mEq/L   Comment: Please note change in reference range.   Potassium 3.8  3.7 - 5.3 mEq/L   Comment: Please note change in reference range.   Chloride 100  96 - 112 mEq/L   CO2 27  19 - 32 mEq/L   Glucose, Bld 97  70 - 99 mg/dL   BUN 29 (*) 6 - 23 mg/dL   Creatinine, Ser 3.20 (*) 0.50 - 1.35 mg/dL   Calcium 9.6  8.4 - 10.5 mg/dL   Total Protein 8.1  6.0 - 8.3 g/dL   Albumin 3.5  3.5 - 5.2  g/dL   AST 28  0 - 37 U/L   ALT 38  0 - 53 U/L   Alkaline Phosphatase  80  39 - 117 U/L   Total Bilirubin 0.4  0.3 - 1.2 mg/dL   GFR calc non Af Amer 24 (*) >90 mL/min   GFR calc Af Amer 27 (*) >90 mL/min   Comment: (NOTE)     The eGFR has been calculated using the CKD EPI equation.     This calculation has not been validated in all clinical situations.     eGFR's persistently <90 mL/min signify possible Chronic Kidney     Disease.    Assessment/Plan: CHF (congestive heart failure) Patient to schedule appointment with Cardiologist.  Reiterated the importance of scheduled follow-up.  Continue current medications.  Essential hypertension, benign Patient not taking Benicar.  Patient giving samples of medication to take.  Instructed to pick up his prescription.  Continue other medications as prescribed.  Discussed risk of hyperkalemia with co-administration of ARBs with Potassium-Sparing diuretics.  Patient endorses being placed on medication combo several years ago by Cardiologist.  Will continue current regimen for now. Will check BMP today and repeat in 3-4 weeks.  OSA (obstructive sleep apnea) Will replace order for sleep study.  CKD (chronic kidney disease) stage 3, GFR 30-59 ml/min Will check BMP.  Patient to schedule an appointment with Nephrology.

## 2013-02-12 NOTE — Progress Notes (Signed)
Pre visit review using our clinic review tool, if applicable. No additional management support is needed unless otherwise documented below in the visit note/SLS  

## 2013-02-12 NOTE — Patient Instructions (Signed)
Please take medications as prescribed.  We will need to recheck your potassium levels in 2 weeks.  Please go to the lab today so we can obtain labs from visit in December.  Follow-up with your cardiologist.  Please reschedule with Nephrology.  You will be contacted again for a sleep study.  It is important that you go to these appointments.

## 2013-02-12 NOTE — Assessment & Plan Note (Signed)
>>  ASSESSMENT AND PLAN FOR BENIGN HYPERTENSION WITH CHRONIC KIDNEY DISEASE, STAGE IV (HCC) WRITTEN ON 02/12/2013  9:33 AM BY Farris Hong, PA-C  Patient not taking Benicar.  Patient giving samples of medication to take.  Instructed to pick up his prescription.  Continue other medications as prescribed.  Discussed risk of hyperkalemia with co-administration of ARBs with Potassium-Sparing diuretics.  Patient endorses being placed on medication combo several years ago by Cardiologist.  Will continue current regimen for now. Will check BMP today and repeat in 3-4 weeks.

## 2013-02-12 NOTE — Assessment & Plan Note (Signed)
Will check BMP.  Patient to schedule an appointment with Nephrology.

## 2013-02-14 ENCOUNTER — Telehealth: Payer: Self-pay | Admitting: Physician Assistant

## 2013-02-14 NOTE — Telephone Encounter (Signed)
Relevant patient education assigned to patient using Emmi. ° °

## 2013-03-10 ENCOUNTER — Other Ambulatory Visit: Payer: Self-pay | Admitting: Physician Assistant

## 2013-03-11 ENCOUNTER — Other Ambulatory Visit: Payer: Self-pay | Admitting: Physician Assistant

## 2013-03-11 LAB — CBC WITH DIFFERENTIAL/PLATELET
Basophils Absolute: 0 10*3/uL (ref 0.0–0.1)
Basophils Relative: 0 % (ref 0–1)
Eosinophils Absolute: 0.2 10*3/uL (ref 0.0–0.7)
Eosinophils Relative: 2 % (ref 0–5)
HCT: 42.7 % (ref 39.0–52.0)
Hemoglobin: 13.7 g/dL (ref 13.0–17.0)
Lymphocytes Relative: 20 % (ref 12–46)
Lymphs Abs: 1.9 10*3/uL (ref 0.7–4.0)
MCH: 23.5 pg — ABNORMAL LOW (ref 26.0–34.0)
MCHC: 32.1 g/dL (ref 30.0–36.0)
MCV: 73.1 fL — ABNORMAL LOW (ref 78.0–100.0)
Monocytes Absolute: 1.1 10*3/uL — ABNORMAL HIGH (ref 0.1–1.0)
Monocytes Relative: 12 % (ref 3–12)
Neutro Abs: 6.2 10*3/uL (ref 1.7–7.7)
Neutrophils Relative %: 66 % (ref 43–77)
Platelets: 281 10*3/uL (ref 150–400)
RBC: 5.84 MIL/uL — ABNORMAL HIGH (ref 4.22–5.81)
RDW: 16.6 % — ABNORMAL HIGH (ref 11.5–15.5)
WBC: 9.4 10*3/uL (ref 4.0–10.5)

## 2013-03-11 LAB — HEMOGLOBIN A1C
Hgb A1c MFr Bld: 5.8 % — ABNORMAL HIGH (ref <5.7)
Mean Plasma Glucose: 120 mg/dL — ABNORMAL HIGH (ref <117)

## 2013-03-11 LAB — TSH: TSH: 2.756 u[IU]/mL (ref 0.350–4.500)

## 2013-03-11 LAB — LIPID PANEL
Cholesterol: 207 mg/dL — ABNORMAL HIGH (ref 0–200)
HDL: 39 mg/dL — ABNORMAL LOW (ref >39)
LDL Cholesterol: 128 mg/dL — ABNORMAL HIGH (ref 0–99)
Total CHOL/HDL Ratio: 5.3 Ratio
Triglycerides: 200 mg/dL — ABNORMAL HIGH (ref <150)
VLDL: 40 mg/dL (ref 0–40)

## 2013-03-11 LAB — URINALYSIS, ROUTINE W REFLEX MICROSCOPIC
Bilirubin Urine: NEGATIVE
Glucose, UA: NEGATIVE mg/dL
Hgb urine dipstick: NEGATIVE
Ketones, ur: NEGATIVE mg/dL
Leukocytes, UA: NEGATIVE
Nitrite: NEGATIVE
Protein, ur: 100 mg/dL — AB
Specific Gravity, Urine: 1.014 (ref 1.005–1.030)
Urobilinogen, UA: 0.2 mg/dL (ref 0.0–1.0)
pH: 5.5 (ref 5.0–8.0)

## 2013-03-11 LAB — HEPATIC FUNCTION PANEL
ALT: 22 U/L (ref 0–53)
AST: 25 U/L (ref 0–37)
Albumin: 4.2 g/dL (ref 3.5–5.2)
Alkaline Phosphatase: 62 U/L (ref 39–117)
Bilirubin, Direct: 0.1 mg/dL (ref 0.0–0.3)
Indirect Bilirubin: 0.4 mg/dL (ref 0.2–1.2)
Total Bilirubin: 0.5 mg/dL (ref 0.2–1.2)
Total Protein: 7.5 g/dL (ref 6.0–8.3)

## 2013-03-11 LAB — URINALYSIS, MICROSCOPIC ONLY
Bacteria, UA: NONE SEEN
CASTS: NONE SEEN
CRYSTALS: NONE SEEN
Squamous Epithelial / LPF: NONE SEEN

## 2013-03-11 LAB — BASIC METABOLIC PANEL WITH GFR
BUN: 27 mg/dL — ABNORMAL HIGH (ref 6–23)
CO2: 27 mEq/L (ref 19–32)
Calcium: 9.6 mg/dL (ref 8.4–10.5)
Chloride: 100 mEq/L (ref 96–112)
Creat: 2.62 mg/dL — ABNORMAL HIGH (ref 0.50–1.35)
GFR, Est African American: 35 mL/min — ABNORMAL LOW
GFR, Est Non African American: 30 mL/min — ABNORMAL LOW
Glucose, Bld: 84 mg/dL (ref 70–99)
Potassium: 3.5 mEq/L (ref 3.5–5.3)
Sodium: 138 mEq/L (ref 135–145)

## 2013-03-12 ENCOUNTER — Encounter (HOSPITAL_BASED_OUTPATIENT_CLINIC_OR_DEPARTMENT_OTHER): Payer: Self-pay

## 2013-03-12 LAB — PTH, INTACT AND CALCIUM
CALCIUM: 9.6 mg/dL (ref 8.4–10.5)
PTH: 172.2 pg/mL — ABNORMAL HIGH (ref 14.0–72.0)

## 2013-03-16 ENCOUNTER — Telehealth: Payer: Self-pay | Admitting: Physician Assistant

## 2013-03-16 DIAGNOSIS — N183 Chronic kidney disease, stage 3 unspecified: Secondary | ICD-10-CM

## 2013-03-16 NOTE — Telephone Encounter (Signed)
Please see previous result note for other lab results.  Patient's PTH level was significantly elevated, consistent with his previoud diagnosis (found by medical record review) of hyperparathyroidism.  This is most likely secondary to his Stage III Chronic Kidney Disease.  IF he has not made the appointment with his Nephrologist yet, he needs to do so ASAP.  It is very important that he sees a specialist to prevent future complications/worsening of disease.  I would like for him to return to the lab so we can check a vitamin D, ionized calcium and phosphorous level.  This will let me know what changes, if any, we need to make to his diet and medication regimen to help combat the effects his CKD is having on his bones and his ability to absorb nutrients.

## 2013-03-17 NOTE — Telephone Encounter (Signed)
PATIENT INFORMED, UNDERSTOOD & AGREED/SLS 03.02.15 8:46a

## 2013-03-19 ENCOUNTER — Ambulatory Visit (INDEPENDENT_AMBULATORY_CARE_PROVIDER_SITE_OTHER): Payer: 59 | Admitting: Physician Assistant

## 2013-03-19 ENCOUNTER — Encounter: Payer: Self-pay | Admitting: Physician Assistant

## 2013-03-19 ENCOUNTER — Ambulatory Visit: Payer: Self-pay | Admitting: Physician Assistant

## 2013-03-19 ENCOUNTER — Emergency Department (HOSPITAL_BASED_OUTPATIENT_CLINIC_OR_DEPARTMENT_OTHER)
Admission: EM | Admit: 2013-03-19 | Discharge: 2013-03-19 | Disposition: A | Discharge status: Home / Self Care | Payer: 59 | Attending: Emergency Medicine | Admitting: Emergency Medicine

## 2013-03-19 ENCOUNTER — Encounter (HOSPITAL_BASED_OUTPATIENT_CLINIC_OR_DEPARTMENT_OTHER): Payer: Self-pay | Admitting: Emergency Medicine

## 2013-03-19 ENCOUNTER — Emergency Department (HOSPITAL_BASED_OUTPATIENT_CLINIC_OR_DEPARTMENT_OTHER): Payer: 59

## 2013-03-19 VITALS — BP 251/147 | HR 120 | Temp 98.9°F | Resp 20 | Ht 68.0 in | Wt 360.8 lb

## 2013-03-19 DIAGNOSIS — I10 Essential (primary) hypertension: Secondary | ICD-10-CM

## 2013-03-19 DIAGNOSIS — R000 Tachycardia, unspecified: Secondary | ICD-10-CM | POA: Insufficient documentation

## 2013-03-19 DIAGNOSIS — R0602 Shortness of breath: Secondary | ICD-10-CM | POA: Insufficient documentation

## 2013-03-19 DIAGNOSIS — I509 Heart failure, unspecified: Secondary | ICD-10-CM

## 2013-03-19 DIAGNOSIS — R635 Abnormal weight gain: Secondary | ICD-10-CM | POA: Insufficient documentation

## 2013-03-19 DIAGNOSIS — R143 Flatulence: Secondary | ICD-10-CM

## 2013-03-19 DIAGNOSIS — R5381 Other malaise: Secondary | ICD-10-CM | POA: Insufficient documentation

## 2013-03-19 DIAGNOSIS — R5383 Other fatigue: Secondary | ICD-10-CM

## 2013-03-19 DIAGNOSIS — I130 Hypertensive heart and chronic kidney disease with heart failure and stage 1 through stage 4 chronic kidney disease, or unspecified chronic kidney disease: Secondary | ICD-10-CM | POA: Insufficient documentation

## 2013-03-19 DIAGNOSIS — G479 Sleep disorder, unspecified: Secondary | ICD-10-CM | POA: Insufficient documentation

## 2013-03-19 DIAGNOSIS — R141 Gas pain: Secondary | ICD-10-CM | POA: Insufficient documentation

## 2013-03-19 DIAGNOSIS — N189 Chronic kidney disease, unspecified: Secondary | ICD-10-CM

## 2013-03-19 DIAGNOSIS — R142 Eructation: Secondary | ICD-10-CM | POA: Insufficient documentation

## 2013-03-19 DIAGNOSIS — E669 Obesity, unspecified: Secondary | ICD-10-CM | POA: Insufficient documentation

## 2013-03-19 DIAGNOSIS — N183 Chronic kidney disease, stage 3 unspecified: Secondary | ICD-10-CM | POA: Insufficient documentation

## 2013-03-19 DIAGNOSIS — E785 Hyperlipidemia, unspecified: Secondary | ICD-10-CM | POA: Insufficient documentation

## 2013-03-19 DIAGNOSIS — R609 Edema, unspecified: Secondary | ICD-10-CM | POA: Insufficient documentation

## 2013-03-19 DIAGNOSIS — H538 Other visual disturbances: Secondary | ICD-10-CM | POA: Insufficient documentation

## 2013-03-19 DIAGNOSIS — M2548 Effusion, other site: Secondary | ICD-10-CM | POA: Insufficient documentation

## 2013-03-19 DIAGNOSIS — R350 Frequency of micturition: Secondary | ICD-10-CM | POA: Insufficient documentation

## 2013-03-19 LAB — PRO B NATRIURETIC PEPTIDE: Pro B Natriuretic peptide (BNP): 344.5 pg/mL — ABNORMAL HIGH (ref 0–125)

## 2013-03-19 LAB — CBC WITH DIFFERENTIAL/PLATELET
BASOS ABS: 0 10*3/uL (ref 0.0–0.1)
Basophils Relative: 0 % (ref 0–1)
EOS PCT: 2 % (ref 0–5)
Eosinophils Absolute: 0.2 10*3/uL (ref 0.0–0.7)
HCT: 42.2 % (ref 39.0–52.0)
Hemoglobin: 13.7 g/dL (ref 13.0–17.0)
Lymphocytes Relative: 17 % (ref 12–46)
Lymphs Abs: 1.8 10*3/uL (ref 0.7–4.0)
MCH: 23.6 pg — AB (ref 26.0–34.0)
MCHC: 32.5 g/dL (ref 30.0–36.0)
MCV: 72.8 fL — AB (ref 78.0–100.0)
MONO ABS: 1.6 10*3/uL — AB (ref 0.1–1.0)
Monocytes Relative: 15 % — ABNORMAL HIGH (ref 3–12)
Neutro Abs: 7 10*3/uL (ref 1.7–7.7)
Neutrophils Relative %: 66 % (ref 43–77)
PLATELETS: 299 10*3/uL (ref 150–400)
RBC: 5.8 MIL/uL (ref 4.22–5.81)
RDW: 17.9 % — ABNORMAL HIGH (ref 11.5–15.5)
WBC: 10.6 10*3/uL — ABNORMAL HIGH (ref 4.0–10.5)

## 2013-03-19 LAB — COMPREHENSIVE METABOLIC PANEL
ALK PHOS: 69 U/L (ref 39–117)
ALT: 25 U/L (ref 0–53)
AST: 27 U/L (ref 0–37)
Albumin: 3.6 g/dL (ref 3.5–5.2)
BILIRUBIN TOTAL: 0.6 mg/dL (ref 0.3–1.2)
BUN: 25 mg/dL — AB (ref 6–23)
CHLORIDE: 101 meq/L (ref 96–112)
CO2: 29 mEq/L (ref 19–32)
Calcium: 9.4 mg/dL (ref 8.4–10.5)
Creatinine, Ser: 3 mg/dL — ABNORMAL HIGH (ref 0.50–1.35)
GFR calc non Af Amer: 25 mL/min — ABNORMAL LOW (ref >90)
GFR, EST AFRICAN AMERICAN: 29 mL/min — AB (ref >90)
GLUCOSE: 102 mg/dL — AB (ref 70–99)
Potassium: 3.5 mEq/L — ABNORMAL LOW (ref 3.7–5.3)
Sodium: 143 mEq/L (ref 137–147)
Total Protein: 8.3 g/dL (ref 6.0–8.3)

## 2013-03-19 LAB — SEDIMENTATION RATE: Sed Rate: 13 mm/hr (ref 0–16)

## 2013-03-19 MED ORDER — FUROSEMIDE 40 MG PO TABS: 40.0000 mg | ORAL_TABLET | Freq: Two times a day (BID) | ORAL | Status: DC

## 2013-03-19 MED ORDER — LABETALOL HCL 5 MG/ML IV SOLN
20.0000 mg | INTRAVENOUS | Status: DC
  Administered 2013-03-19 (×2): 20 mg via INTRAVENOUS
  Filled 2013-03-19 (×2): qty 4

## 2013-03-19 MED ORDER — FUROSEMIDE 10 MG/ML IJ SOLN
80.0000 mg | Freq: Once | INTRAMUSCULAR | Status: AC
  Administered 2013-03-19: 80 mg via INTRAVENOUS
  Filled 2013-03-19: qty 8

## 2013-03-19 NOTE — Progress Notes (Signed)
Pre visit review using our clinic review tool, if applicable. No additional management support is needed unless otherwise documented below in the visit note/SLS  

## 2013-03-19 NOTE — Assessment & Plan Note (Signed)
Concern for CHF giving patient's symptoms. BP at hypertensive emergency level.  Decreased-No urinary output in patient with Stage III CKD.  Patient triaged immediately to ER for further evaluation and treatment.  ER MD consulted and ok'd patient to come directly downstairs via wheelchair.

## 2013-03-19 NOTE — ED Notes (Signed)
Pt from upstairs from [facility] for eval of CHF.  Reports SOB, extremity swelling since this morning.  Pt reports difficulty with urination also.

## 2013-03-19 NOTE — Progress Notes (Signed)
Patient presents to clinic today c/o shortness of breath, lower and upper extremity edema and no significant urinary output for the past day.  Patient denies chest pain but endorses palpitations.  Patient has history of Stage III CKD, Hypertension, Hyperlidemia and CHF.  Patient endorses taking medications as prescribed, including Lasix.  Denies no output since Lasix dosing today.  Patient denies recent travel or prolonged immobilization.  Patient's BP is 251/147 in clinic taken manually.  Patient endorses lightheadedness and dizziness.  Past Medical History  Diagnosis Date  . CHF (congestive heart failure)   . 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 Chronic kidney disease, stage 3, mod decreased GFR   . Gout   . Hyperlipidemia   . Obesity     No current facility-administered medications on file prior to visit.   Current Outpatient Prescriptions on File Prior to Visit  Medication Sig Dispense Refill  . carvedilol (COREG) 25 MG tablet Take 1 tablet (25 mg total) by mouth 2 (two) times daily with a meal.  60 tablet  1  . EQ FIBER SUPPLEMENT PO Take by mouth daily.      . furosemide (LASIX) 40 MG tablet Take 1 tablet (40 mg total) by mouth daily.  30 tablet  1  . hydrALAZINE (APRESOLINE) 100 MG tablet Take 1 tablet (100 mg total) by mouth 3 (three) times daily.  90 tablet  1  . NON FORMULARY Take 2 capsules by mouth daily. Colon Clenz Herbal      . olmesartan (BENICAR) 40 MG tablet Take 1 tablet (40 mg total) by mouth daily.  30 tablet  1  . spironolactone (ALDACTONE) 25 MG tablet Take 1 tablet (25 mg total) by mouth daily.  30 tablet  1    No Known Allergies  Family History  Problem Relation Age of Onset  . Hypertension Mother   . Diabetes Neg Hx   . Heart attack Neg Hx   . Hyperlipidemia Mother   . Sudden death Neg Hx   . Heart disease Mother   . Hypertension Maternal Grandmother   . Stroke Maternal Grandfather   . Stroke Maternal  Grandmother   . Hypertension Maternal Grandfather   . Heart disease Maternal Grandmother   . Heart disease Maternal Grandfather   . Hyperlipidemia Maternal Grandmother   . Hyperlipidemia Maternal Grandfather   . Cancer - Other Paternal Grandmother   . Alzheimer's disease Maternal Grandfather   . Healthy Brother     x2  . Healthy Sister     x2  . Healthy Son     x2  . Healthy Daughter     x1    History   Social History  . Marital Status: Legally Separated    Spouse Name: N/A    Number of Children: N/A  . Years of Education: N/A   Social History Main Topics  . Smoking status: Never Smoker   . Smokeless tobacco: Never Used  . Alcohol Use: Yes     Comment: socially -- once a month  . Drug Use: No  . Sexual Activity: None   Other Topics Concern  . None   Social History Narrative  . None   Review of Systems - See HPI.  All other ROS are negative.  BP 251/147  Pulse 120  Temp(Src) 98.9 F (37.2 C) (Oral)  Resp 20  Ht $R'5\' 8"'kt$  (1.727 m)  Wt 360 lb 12 oz (163.635 kg)  BMI 54.86  kg/m2  SpO2 98%  Physical Exam  Vitals reviewed. Constitutional: He is oriented to person, place, and time and well-developed, well-nourished, and in no distress.  HENT:  Head: Normocephalic and atraumatic.  Right Ear: External ear normal.  Left Ear: External ear normal.  Nose: Nose normal.  Eyes: Conjunctivae are normal. Pupils are equal, round, and reactive to light.  Neck: Neck supple.  Cardiovascular: Normal rate, regular rhythm, S1 normal, S2 normal, normal heart sounds and intact distal pulses.   No murmur heard. Bilateral lower extremity edema that is nonpitting. Pulses present and 2+  Pulmonary/Chest: No respiratory distress. He has no wheezes. He has no rales.  Decreased breath sounds of lower lobes bilaterally.  Neurological: He is alert and oriented to person, place, and time.  Skin: Skin is warm and dry.    Recent Results (from the past 2160 hour(s))  CBC WITH  DIFFERENTIAL     Status: Abnormal   Collection Time    01/16/13 11:25 PM      Result Value Ref Range   WBC 9.8  4.0 - 10.5 K/uL   RBC 5.43  4.22 - 5.81 MIL/uL   Hemoglobin 13.0  13.0 - 17.0 g/dL   HCT 25.8  94.4 - 20.7 %   MCV 75.3 (*) 78.0 - 100.0 fL   MCH 23.9 (*) 26.0 - 34.0 pg   MCHC 31.8  30.0 - 36.0 g/dL   RDW 06.9  91.0 - 79.1 %   Platelets 301  150 - 400 K/uL   Neutrophils Relative % 67  43 - 77 %   Neutro Abs 6.5  1.7 - 7.7 K/uL   Lymphocytes Relative 16  12 - 46 %   Lymphs Abs 1.6  0.7 - 4.0 K/uL   Monocytes Relative 14 (*) 3 - 12 %   Monocytes Absolute 1.4 (*) 0.1 - 1.0 K/uL   Eosinophils Relative 3  0 - 5 %   Eosinophils Absolute 0.3  0.0 - 0.7 K/uL   Basophils Relative 0  0 - 1 %   Basophils Absolute 0.0  0.0 - 0.1 K/uL  COMPREHENSIVE METABOLIC PANEL     Status: Abnormal   Collection Time    01/16/13 11:25 PM      Result Value Ref Range   Sodium 141  137 - 147 mEq/L   Comment: Please note change in reference range.   Potassium 3.8  3.7 - 5.3 mEq/L   Comment: Please note change in reference range.   Chloride 100  96 - 112 mEq/L   CO2 27  19 - 32 mEq/L   Glucose, Bld 97  70 - 99 mg/dL   BUN 29 (*) 6 - 23 mg/dL   Creatinine, Ser 7.77 (*) 0.50 - 1.35 mg/dL   Calcium 9.6  8.4 - 60.7 mg/dL   Total Protein 8.1  6.0 - 8.3 g/dL   Albumin 3.5  3.5 - 5.2 g/dL   AST 28  0 - 37 U/L   ALT 38  0 - 53 U/L   Alkaline Phosphatase 80  39 - 117 U/L   Total Bilirubin 0.4  0.3 - 1.2 mg/dL   GFR calc non Af Amer 24 (*) >90 mL/min   GFR calc Af Amer 27 (*) >90 mL/min   Comment: (NOTE)     The eGFR has been calculated using the CKD EPI equation.     This calculation has not been validated in all clinical situations.     eGFR's persistently <90 mL/min  signify possible Chronic Kidney     Disease.  CBC WITH DIFFERENTIAL     Status: Abnormal   Collection Time    03/10/13  3:35 PM      Result Value Ref Range   WBC 9.4  4.0 - 10.5 K/uL   RBC 5.84 (*) 4.22 - 5.81 MIL/uL    Hemoglobin 13.7  13.0 - 17.0 g/dL   HCT 42.7  39.0 - 52.0 %   MCV 73.1 (*) 78.0 - 100.0 fL   MCH 23.5 (*) 26.0 - 34.0 pg   MCHC 32.1  30.0 - 36.0 g/dL   RDW 16.6 (*) 11.5 - 15.5 %   Platelets 281  150 - 400 K/uL   Neutrophils Relative % 66  43 - 77 %   Neutro Abs 6.2  1.7 - 7.7 K/uL   Lymphocytes Relative 20  12 - 46 %   Lymphs Abs 1.9  0.7 - 4.0 K/uL   Monocytes Relative 12  3 - 12 %   Monocytes Absolute 1.1 (*) 0.1 - 1.0 K/uL   Eosinophils Relative 2  0 - 5 %   Eosinophils Absolute 0.2  0.0 - 0.7 K/uL   Basophils Relative 0  0 - 1 %   Basophils Absolute 0.0  0.0 - 0.1 K/uL   Smear Review Criteria for review not met    HEPATIC FUNCTION PANEL     Status: None   Collection Time    03/10/13  3:35 PM      Result Value Ref Range   Total Bilirubin 0.5  0.2 - 1.2 mg/dL   Comment: ** Please note change in reference range(s). **   Bilirubin, Direct 0.1  0.0 - 0.3 mg/dL   Indirect Bilirubin 0.4  0.2 - 1.2 mg/dL   Comment: ** Please note change in reference range(s). **   Alkaline Phosphatase 62  39 - 117 U/L   AST 25  0 - 37 U/L   ALT 22  0 - 53 U/L   Total Protein 7.5  6.0 - 8.3 g/dL   Albumin 4.2  3.5 - 5.2 g/dL  TSH     Status: None   Collection Time    03/10/13  3:35 PM      Result Value Ref Range   TSH 2.756  0.350 - 4.500 uIU/mL  URINALYSIS, ROUTINE W REFLEX MICROSCOPIC     Status: Abnormal   Collection Time    03/10/13  3:35 PM      Result Value Ref Range   Color, Urine YELLOW  YELLOW   APPearance CLEAR  CLEAR   Specific Gravity, Urine 1.014  1.005 - 1.030   pH 5.5  5.0 - 8.0   Glucose, UA NEG  NEG mg/dL   Bilirubin Urine NEG  NEG   Ketones, ur NEG  NEG mg/dL   Hgb urine dipstick NEG  NEG   Protein, ur 100 (*) NEG mg/dL   Urobilinogen, UA 0.2  0.0 - 1.0 mg/dL   Nitrite NEG  NEG   Leukocytes, UA NEG  NEG  LIPID PANEL     Status: Abnormal   Collection Time    03/10/13  3:35 PM      Result Value Ref Range   Cholesterol 207 (*) 0 - 200 mg/dL   Comment: ATP III  Classification:           < 200        mg/dL        Desirable  200 - 239     mg/dL        Borderline High          >= 240        mg/dL        High         Triglycerides 200 (*) <150 mg/dL   HDL 39 (*) >39 mg/dL   Total CHOL/HDL Ratio 5.3     VLDL 40  0 - 40 mg/dL   LDL Cholesterol 128 (*) 0 - 99 mg/dL   Comment:       Total Cholesterol/HDL Ratio:CHD Risk                            Coronary Heart Disease Risk Table                                            Men       Women              1/2 Average Risk              3.4        3.3                  Average Risk              5.0        4.4               2X Average Risk              9.6        7.1               3X Average Risk             23.4       11.0     Use the calculated Patient Ratio above and the CHD Risk table      to determine the patient's CHD Risk.     ATP III Classification (LDL):           < 100        mg/dL         Optimal          100 - 129     mg/dL         Near or Above Optimal          130 - 159     mg/dL         Borderline High          160 - 189     mg/dL         High           > 190        mg/dL         Very High        HEMOGLOBIN A1C     Status: Abnormal   Collection Time    03/10/13  3:35 PM      Result Value Ref Range   Hemoglobin A1C 5.8 (*) <5.7 %   Comment:  According to the ADA Clinical Practice Recommendations for 2011, when     HbA1c is used as a screening test:             >=6.5%   Diagnostic of Diabetes Mellitus                (if abnormal result is confirmed)           5.7-6.4%   Increased risk of developing Diabetes Mellitus           References:Diagnosis and Classification of Diabetes Mellitus,Diabetes     QMGQ,6761,95(KDTOI 1):S62-S69 and Standards of Medical Care in             Diabetes - 2011,Diabetes ZTIW,5809,98 (Suppl 1):S11-S61.         Mean Plasma Glucose 120 (*) <117 mg/dL  BASIC METABOLIC PANEL WITH  GFR     Status: Abnormal   Collection Time    03/10/13  3:35 PM      Result Value Ref Range   Sodium 138  135 - 145 mEq/L   Potassium 3.5  3.5 - 5.3 mEq/L   Chloride 100  96 - 112 mEq/L   CO2 27  19 - 32 mEq/L   Glucose, Bld 84  70 - 99 mg/dL   BUN 27 (*) 6 - 23 mg/dL   Creat 2.62 (*) 0.50 - 1.35 mg/dL   Calcium 9.6  8.4 - 10.5 mg/dL   GFR, Est African American 35 (*)    GFR, Est Non African American 30 (*)    Comment:       The estimated GFR is a calculation valid for adults (>=24 years old)     that uses the CKD-EPI algorithm to adjust for age and sex. It is       not to be used for children, pregnant women, hospitalized patients,        patients on dialysis, or with rapidly changing kidney function.     According to the NKDEP, eGFR >89 is normal, 60-89 shows mild     impairment, 30-59 shows moderate impairment, 15-29 shows severe     impairment and <15 is ESRD.        PTH, INTACT AND CALCIUM     Status: Abnormal   Collection Time    03/10/13  3:35 PM      Result Value Ref Range   PTH 172.2 (*) 14.0 - 72.0 pg/mL   Calcium 9.6  8.4 - 10.5 mg/dL  URINALYSIS, MICROSCOPIC ONLY     Status: None   Collection Time    03/10/13  3:35 PM      Result Value Ref Range   Squamous Epithelial / LPF NONE SEEN  RARE   Crystals NONE SEEN  NONE SEEN   Casts NONE SEEN  NONE SEEN   WBC, UA 0-2  <3 WBC/hpf   RBC / HPF 0-2  <3 RBC/hpf   Bacteria, UA NONE SEEN  RARE    Assessment/Plan: CHF exacerbation Concern for CHF giving patient's symptoms. BP at hypertensive emergency level.  Decreased-No urinary output in patient with Stage III CKD.  Patient triaged immediately to ER for further evaluation and treatment.  ER MD consulted and ok'd patient to come directly downstairs via wheelchair.

## 2013-03-19 NOTE — Discharge Instructions (Signed)

## 2013-03-19 NOTE — ED Provider Notes (Signed)
CSN: [id number]     Arrival date & time 03/19/13  1812 History  This chart was scribed for Tanna Furry, MD by Einar Pheasant, ED Scribe. This patient was seen in room MH01/MH01 and the patient's care was started at 6:43 PM.    Chief Complaint  Patient presents with  . Congestive Heart Failure     The history is provided by the patient. No language interpreter was used.   HPI Comments: Frank Coffey is a 33 y.o. male with a history of CHF and hypertension, presents to the Emergency Department complaining of bloated abdomen that started today. Pt states that last night he was peeing a lot, but upon waking today he has not urinated. He is also complaining of associated weight gain, chills, headache, visual disturbances (felt like the sun was in his eye the whole day), SOB, sleep disturbance (secondary to frequent urination last night), occasional weakness on the left side. Pt states that he weighed himself at work and noticed that he had gained about 20 lbs. He is also complaining of swollen hands and feet. Pt reports being diagnosed with hypertension since his mid 51s. Denies any fever, emesis, nausea, diarrhea, congestion, and palpitations.    Past Medical History  Diagnosis Date  . CHF (congestive heart failure)   . 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 Chronic kidney disease, stage 3, mod decreased GFR   . Gout   . Hyperlipidemia   . Obesity    Past Surgical History  Procedure Laterality Date  . Cholecystectomy    . Retinal detachment repair w/ scleral buckle le      Left   Family History  Problem Relation Age of Onset  . Hypertension Mother   . Diabetes Neg Hx   . Heart attack Neg Hx   . Hyperlipidemia Mother   . Sudden death Neg Hx   . Heart disease Mother   . Hypertension Maternal Grandmother   . Stroke Maternal Grandfather   . Stroke Maternal Grandmother   . Hypertension Maternal Grandfather   . Heart disease Maternal  Grandmother   . Heart disease Maternal Grandfather   . Hyperlipidemia Maternal Grandmother   . Hyperlipidemia Maternal Grandfather   . Cancer - Other Paternal Grandmother   . Alzheimer's disease Maternal Grandfather   . Healthy Brother     x2  . Healthy Sister     x2  . Healthy Son     x2  . Healthy Daughter     x1   History  Substance Use Topics  . Smoking status: Never Smoker   . Smokeless tobacco: Never Used  . Alcohol Use: Yes     Comment: socially -- once a month    Review of Systems  Constitutional: Positive for chills and unexpected weight change. Negative for fever, diaphoresis, appetite change and fatigue.  HENT: Negative for mouth sores, sore throat and trouble swallowing.   Eyes: Positive for visual disturbance.  Respiratory: Negative for cough, chest tightness and wheezing.   Gastrointestinal: Positive for abdominal distention. Negative for nausea, vomiting and diarrhea.  Endocrine: Negative for polydipsia, polyphagia and polyuria.  Genitourinary: Negative for dysuria, frequency and hematuria.  Musculoskeletal: Positive for joint swelling. Negative for gait problem.  Skin: Negative for color change, pallor and rash.  Neurological: Positive for weakness (occasional left sided ). Negative for dizziness, syncope and light-headedness.  Hematological: Does not bruise/bleed easily.  Psychiatric/Behavioral: Positive for sleep disturbance. Negative for behavioral problems  and confusion.      Allergies  Review of patient's allergies indicates no known allergies.  Home Medications   Current Outpatient Rx  Name  Route  Sig  Dispense  Refill  . carvedilol (COREG) 25 MG tablet   Oral   Take 1 tablet (25 mg total) by mouth 2 (two) times daily with a meal.   60 tablet   1   . EQ FIBER SUPPLEMENT PO   Oral   Take by mouth daily.         . furosemide (LASIX) 40 MG tablet   Oral   Take 1 tablet (40 mg total) by mouth daily.   30 tablet   1   . furosemide  (LASIX) 40 MG tablet   Oral   Take 1 tablet (40 mg total) by mouth 2 (two) times daily.   60 tablet   0   . hydrALAZINE (APRESOLINE) 100 MG tablet   Oral   Take 1 tablet (100 mg total) by mouth 3 (three) times daily.   90 tablet   1   . NON FORMULARY   Oral   Take 2 capsules by mouth daily. Colon Clenz Herbal         . olmesartan (BENICAR) 40 MG tablet   Oral   Take 1 tablet (40 mg total) by mouth daily.   30 tablet   1   . spironolactone (ALDACTONE) 25 MG tablet   Oral   Take 1 tablet (25 mg total) by mouth daily.   30 tablet   1    Triage vitals: BP 172/126  Pulse 105  Temp(Src) 99.5 F (37.5 C) (Oral)  Resp 18  Ht 5\' 8"  (1.727 m)  Wt 360 lb (163.295 kg)  BMI 54.75 kg/m2  SpO2 98%  Physical Exam  Nursing note and vitals reviewed. Constitutional: He is oriented to person, place, and time. He appears well-developed and well-nourished. No distress.  HENT:  Head: Normocephalic and atraumatic.  Right Ear: External ear normal.  Left Ear: External ear normal.  Nose: Nose normal.  Mouth/Throat: Oropharynx is clear and moist.  Eyes: Conjunctivae are normal. Pupils are equal, round, and reactive to light. No scleral icterus.  Neck: Normal range of motion. Neck supple. No JVD present. No thyromegaly present.  Cardiovascular: Regular rhythm and normal heart sounds.  Tachycardia present.  Exam reveals no gallop and no friction rub.   No murmur heard. Pulmonary/Chest: Effort normal and breath sounds normal. No respiratory distress. He has no wheezes. He has no rales.  Abdominal: Soft. Bowel sounds are normal. He exhibits no distension. There is no tenderness. There is no rebound.  Musculoskeletal: Normal range of motion. He exhibits edema (1+ edema in his right hand, 1-2+ edema in his ankles).  Neurological: He is alert and oriented to person, place, and time.  Skin: Skin is warm and dry. No rash noted.  Psychiatric: He has a normal mood and affect. His behavior is  normal.    ED Course  Procedures (including critical care time)  DIAGNOSTIC STUDIES: Oxygen Saturation is 98% on RA, normal by my interpretation.    COORDINATION OF CARE: 6:53 PM- Pt advised of plan for treatment and pt agrees.  Labs Review Labs Reviewed  CBC WITH DIFFERENTIAL - Abnormal; Notable for the following:    WBC 10.6 (*)    MCV 72.8 (*)    MCH 23.6 (*)    RDW 17.9 (*)    Monocytes Relative 15 (*)  Monocytes Absolute 1.6 (*)    All other components within normal limits  PRO B NATRIURETIC PEPTIDE - Abnormal; Notable for the following:    Pro B Natriuretic peptide (BNP) 344.5 (*)    All other components within normal limits  COMPREHENSIVE METABOLIC PANEL - Abnormal; Notable for the following:    Potassium 3.5 (*)    Glucose, Bld 102 (*)    BUN 25 (*)    Creatinine, Ser 3.00 (*)    GFR calc non Af Amer 25 (*)    GFR calc Af Amer 29 (*)    All other components within normal limits  SEDIMENTATION RATE   Imaging Review Dg Chest 2 View  03/19/2013   CLINICAL DATA:  CONGESTIVE HEART FAILURE CONGESTIVE HEART FAILURE  EXAM: CHEST  2 VIEW  COMPARISON:  DG CHEST 2 VIEW dated 07/24/2012  FINDINGS: Low lung volumes. The heart size and mediastinal contours are within normal limits. Both lungs are clear. The visualized skeletal structures are unremarkable.  IMPRESSION: No active cardiopulmonary disease.   Electronically Signed   By: Margaree Mackintosh M.D.   On: 03/19/2013 21:31     EKG Interpretation None      MDM   Final diagnoses:  Hypertension  Chronic renal insufficiency    Renal function is at his baseline. No BNP. Radiographically, and clinically not in overt congestive heart failure. Blood pressures improved. He is diuresed well. Plan close followup with PCP. Medicine compliance. Twice a day Lasix. Recheck primary care.  I personally performed the services described in this documentation, which was scribed in my presence. The recorded information has been reviewed  and is accurate.    Tanna Furry, MD 03/19/13 [id number]

## 2013-04-04 ENCOUNTER — Ambulatory Visit (HOSPITAL_BASED_OUTPATIENT_CLINIC_OR_DEPARTMENT_OTHER): Payer: 59 | Attending: Physician Assistant

## 2013-04-25 ENCOUNTER — Emergency Department (HOSPITAL_BASED_OUTPATIENT_CLINIC_OR_DEPARTMENT_OTHER)
Admission: EM | Admit: 2013-04-25 | Discharge: 2013-04-25 | Disposition: A | Discharge status: Home / Self Care | Payer: Managed Care, Other (non HMO) | Attending: Emergency Medicine | Admitting: Emergency Medicine

## 2013-04-25 ENCOUNTER — Other Ambulatory Visit: Payer: Self-pay | Admitting: Physician Assistant

## 2013-04-25 ENCOUNTER — Encounter (HOSPITAL_BASED_OUTPATIENT_CLINIC_OR_DEPARTMENT_OTHER): Payer: Self-pay | Admitting: Emergency Medicine

## 2013-04-25 ENCOUNTER — Emergency Department (HOSPITAL_BASED_OUTPATIENT_CLINIC_OR_DEPARTMENT_OTHER): Payer: Managed Care, Other (non HMO)

## 2013-04-25 DIAGNOSIS — N189 Chronic kidney disease, unspecified: Secondary | ICD-10-CM

## 2013-04-25 DIAGNOSIS — N183 Chronic kidney disease, stage 3 unspecified: Secondary | ICD-10-CM | POA: Insufficient documentation

## 2013-04-25 DIAGNOSIS — I10 Essential (primary) hypertension: Secondary | ICD-10-CM

## 2013-04-25 DIAGNOSIS — G43909 Migraine, unspecified, not intractable, without status migrainosus: Secondary | ICD-10-CM | POA: Insufficient documentation

## 2013-04-25 DIAGNOSIS — J40 Bronchitis, not specified as acute or chronic: Secondary | ICD-10-CM

## 2013-04-25 DIAGNOSIS — Z8639 Personal history of other endocrine, nutritional and metabolic disease: Secondary | ICD-10-CM | POA: Insufficient documentation

## 2013-04-25 DIAGNOSIS — Z862 Personal history of diseases of the blood and blood-forming organs and certain disorders involving the immune mechanism: Secondary | ICD-10-CM | POA: Insufficient documentation

## 2013-04-25 DIAGNOSIS — I509 Heart failure, unspecified: Secondary | ICD-10-CM | POA: Insufficient documentation

## 2013-04-25 DIAGNOSIS — J209 Acute bronchitis, unspecified: Secondary | ICD-10-CM | POA: Insufficient documentation

## 2013-04-25 DIAGNOSIS — I160 Hypertensive urgency: Secondary | ICD-10-CM

## 2013-04-25 DIAGNOSIS — I130 Hypertensive heart and chronic kidney disease with heart failure and stage 1 through stage 4 chronic kidney disease, or unspecified chronic kidney disease: Secondary | ICD-10-CM | POA: Insufficient documentation

## 2013-04-25 DIAGNOSIS — R51 Headache: Secondary | ICD-10-CM

## 2013-04-25 DIAGNOSIS — Z79899 Other long term (current) drug therapy: Secondary | ICD-10-CM | POA: Insufficient documentation

## 2013-04-25 DIAGNOSIS — R519 Headache, unspecified: Secondary | ICD-10-CM

## 2013-04-25 DIAGNOSIS — E669 Obesity, unspecified: Secondary | ICD-10-CM | POA: Insufficient documentation

## 2013-04-25 HISTORY — DX: Personal history of other diseases of the nervous system and sense organs: Z86.69

## 2013-04-25 LAB — CBC WITH DIFFERENTIAL/PLATELET
Basophils Absolute: 0 10*3/uL (ref 0.0–0.1)
Basophils Relative: 0 % (ref 0–1)
EOS ABS: 0.2 10*3/uL (ref 0.0–0.7)
Eosinophils Relative: 2 % (ref 0–5)
HCT: 39.9 % (ref 39.0–52.0)
Hemoglobin: 12.8 g/dL — ABNORMAL LOW (ref 13.0–17.0)
LYMPHS PCT: 20 % (ref 12–46)
Lymphs Abs: 2.2 10*3/uL (ref 0.7–4.0)
MCH: 23.6 pg — AB (ref 26.0–34.0)
MCHC: 32.1 g/dL (ref 30.0–36.0)
MCV: 73.6 fL — ABNORMAL LOW (ref 78.0–100.0)
MONOS PCT: 11 % (ref 3–12)
Monocytes Absolute: 1.2 10*3/uL — ABNORMAL HIGH (ref 0.1–1.0)
NEUTROS ABS: 7.6 10*3/uL (ref 1.7–7.7)
Neutrophils Relative %: 67 % (ref 43–77)
PLATELETS: 305 10*3/uL (ref 150–400)
RBC: 5.42 MIL/uL (ref 4.22–5.81)
RDW: 17.7 % — ABNORMAL HIGH (ref 11.5–15.5)
WBC: 11.3 10*3/uL — AB (ref 4.0–10.5)

## 2013-04-25 LAB — BASIC METABOLIC PANEL
BUN: 26 mg/dL — ABNORMAL HIGH (ref 6–23)
CALCIUM: 9.3 mg/dL (ref 8.4–10.5)
CO2: 27 mEq/L (ref 19–32)
Chloride: 104 mEq/L (ref 96–112)
Creatinine, Ser: 3 mg/dL — ABNORMAL HIGH (ref 0.50–1.35)
GFR, EST AFRICAN AMERICAN: 29 mL/min — AB (ref >90)
GFR, EST NON AFRICAN AMERICAN: 25 mL/min — AB (ref >90)
GLUCOSE: 113 mg/dL — AB (ref 70–99)
POTASSIUM: 3.7 meq/L (ref 3.7–5.3)
SODIUM: 144 meq/L (ref 137–147)

## 2013-04-25 MED ORDER — ALBUTEROL SULFATE HFA 108 (90 BASE) MCG/ACT IN AERS
2.0000 | INHALATION_SPRAY | RESPIRATORY_TRACT | Status: DC | PRN
  Administered 2013-04-25: 2 via RESPIRATORY_TRACT
  Filled 2013-04-25: qty 6.7

## 2013-04-25 MED ORDER — ACETAMINOPHEN 500 MG PO TABS
1000.0000 mg | ORAL_TABLET | Freq: Once | ORAL | Status: AC
  Administered 2013-04-25: 1000 mg via ORAL
  Filled 2013-04-25: qty 2

## 2013-04-25 MED ORDER — CARVEDILOL 25 MG PO TABS: 25.0000 mg | ORAL_TABLET | Freq: Two times a day (BID) | ORAL | Status: DC

## 2013-04-25 MED ORDER — FUROSEMIDE 10 MG/ML IJ SOLN
40.0000 mg | Freq: Once | INTRAMUSCULAR | Status: AC
  Administered 2013-04-25: 40 mg via INTRAVENOUS
  Filled 2013-04-25: qty 4

## 2013-04-25 MED ORDER — ENALAPRILAT 1.25 MG/ML IV INJ
0.6250 mg | INJECTION | Freq: Once | INTRAVENOUS | Status: AC
Start: 2013-04-25 — End: 2013-04-25
  Administered 2013-04-25: 0.625 mg via INTRAVENOUS
  Filled 2013-04-25: qty 2

## 2013-04-25 MED ORDER — LEVALBUTEROL HCL 1.25 MG/3ML IN NEBU
1.2500 mg | INHALATION_SOLUTION | Freq: Once | RESPIRATORY_TRACT | Status: AC
  Administered 2013-04-25: 1.25 mg via RESPIRATORY_TRACT
  Filled 2013-04-25: qty 0.5

## 2013-04-25 MED ORDER — AZITHROMYCIN 250 MG PO TABS: ORAL_TABLET | ORAL | Status: DC

## 2013-04-25 MED ORDER — FUROSEMIDE 40 MG PO TABS: 40.0000 mg | ORAL_TABLET | Freq: Every day | ORAL | Status: DC

## 2013-04-25 MED ORDER — LEVALBUTEROL HCL 0.63 MG/3ML IN NEBU: 0.6300 mg | INHALATION_SOLUTION | Freq: Once | RESPIRATORY_TRACT | Status: DC

## 2013-04-25 NOTE — ED Notes (Signed)
Headache x 2-3 days hx of migraines. Hasn't had b/p meds in 2 days.  Also has "cold sx"  Cough at night

## 2013-04-25 NOTE — Patient Instructions (Signed)
Instructed patient on the proper use of administering albuterol mdi via aerochamber patient tolerated well 

## 2013-04-25 NOTE — ED Notes (Signed)
MD at bedside. 

## 2013-04-25 NOTE — Telephone Encounter (Signed)
Patient Given Printed Scripts 04.10.15 at Emergency Dept/sls

## 2013-04-25 NOTE — ED Notes (Signed)
Pt c/o headache since yesterday, denies blurred vision. Pt has hx of htn and is currently out of meds. Also c/o cough to point of vomiting, denies fever.

## 2013-04-25 NOTE — ED Provider Notes (Signed)
CSN: [id number]     Arrival date & time 04/25/13  0310 History   First MD Initiated Contact with Patient 04/25/13 [phone]     Chief Complaint  Patient presents with  . Headache     (Consider location/radiation/quality/duration/timing/severity/associated sxs/prior Treatment) HPI This is a 33 year old male with a history of CHF, renal insufficiency and migraines. About a month ago he developed a cough and sore throat. The sore throat resolved after a week and a half but the cough has lingered. He denies nasal congestion or fever.  He ran out of his carvedilol, Lasix and Benicar 2 days ago. He states that he feels bloated because he has not been taking his Lasix. He is here now with a headache that began yesterday evening about 11 PM. The onset was gradual. It is located in his forehead bilaterally. He denies associated nausea, vomiting, visual changes, focal neurologic changes or photophobia. He is having some mild shortness of breath but no chest pain. He was noted to be hypertensive on arrival. He is having some abdominal wall pain which she attributes to exercising vigorously earlier this week. He has not taken anything for his headache.  Past Medical History  Diagnosis Date  . CHF (congestive heart failure)   . 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 Chronic kidney disease, stage 3, mod decreased GFR   . Gout   . Hyperlipidemia   . Obesity   . Hx of migraine headaches    Past Surgical History  Procedure Laterality Date  . Cholecystectomy    . Retinal detachment repair w/ scleral buckle le      Left   Family History  Problem Relation Age of Onset  . Hypertension Mother   . Diabetes Neg Hx   . Heart attack Neg Hx   . Hyperlipidemia Mother   . Sudden death Neg Hx   . Heart disease Mother   . Hypertension Maternal Grandmother   . Stroke Maternal Grandfather   . Stroke Maternal Grandmother   . Hypertension Maternal Grandfather   .  Heart disease Maternal Grandmother   . Heart disease Maternal Grandfather   . Hyperlipidemia Maternal Grandmother   . Hyperlipidemia Maternal Grandfather   . Cancer - Other Paternal Grandmother   . Alzheimer's disease Maternal Grandfather   . Healthy Brother     x2  . Healthy Sister     x2  . Healthy Son     x2  . Healthy Daughter     x1   History  Substance Use Topics  . Smoking status: Never Smoker   . Smokeless tobacco: Never Used  . Alcohol Use: Yes     Comment: socially -- once a month    Review of Systems  All other systems reviewed and are negative.  Allergies  Review of patient's allergies indicates no known allergies.  Home Medications   Current Outpatient Rx  Name  Route  Sig  Dispense  Refill  . carvedilol (COREG) 25 MG tablet   Oral   Take 1 tablet (25 mg total) by mouth 2 (two) times daily with a meal.   60 tablet   1   . EQ FIBER SUPPLEMENT PO   Oral   Take by mouth daily.         . furosemide (LASIX) 40 MG tablet   Oral   Take 1 tablet (40 mg total) by mouth daily.   30 tablet   1   .  furosemide (LASIX) 40 MG tablet   Oral   Take 1 tablet (40 mg total) by mouth 2 (two) times daily.   60 tablet   0   . hydrALAZINE (APRESOLINE) 100 MG tablet   Oral   Take 1 tablet (100 mg total) by mouth 3 (three) times daily.   90 tablet   1   . NON FORMULARY   Oral   Take 2 capsules by mouth daily. Colon Clenz Herbal         . olmesartan (BENICAR) 40 MG tablet   Oral   Take 1 tablet (40 mg total) by mouth daily.   30 tablet   1   . spironolactone (ALDACTONE) 25 MG tablet   Oral   Take 1 tablet (25 mg total) by mouth daily.   30 tablet   1    BP 170/127  Pulse 102  Temp(Src) 98.6 F (37 C) (Oral)  Resp 22  Ht 5\' 8"  (1.727 m)  Wt 350 lb (158.759 kg)  BMI 53.23 kg/m2  SpO2 93%  Physical Exam General: Well-developed, well-nourished male in no acute distress; appearance consistent with age of record HENT: normocephalic;  atraumatic Eyes: Right pupil round and reactive to light, left pupil nonreactive with pupillary opacity; extraocular muscles intact Neck: supple Heart: regular rate and rhythm Lungs: Decreased air movement bilaterally Abdomen: soft; nondistended; mild diffuse tenderness; bowel sounds present Extremities: No deformity; full range of motion; trace edema of lower legs Neurologic: Awake, alert and oriented; motor function intact in all extremities and symmetric; no facial droop Skin: Warm and dry Psychiatric: Normal mood and affect     ED Course  Procedures (including critical care time)      Nursing notes and vitals signs, including pulse oximetry, reviewed.  Summary of this visit's results, reviewed by myself:  Labs:  Results for orders placed during the hospital encounter of 04/25/13 (from the past 24 hour(s))  BASIC METABOLIC PANEL     Status: Abnormal   Collection Time    04/25/13  4:42 AM      Result Value Ref Range   Sodium 144  137 - 147 mEq/L   Potassium 3.7  3.7 - 5.3 mEq/L   Chloride 104  96 - 112 mEq/L   CO2 27  19 - 32 mEq/L   Glucose, Bld 113 (*) 70 - 99 mg/dL   BUN 26 (*) 6 - 23 mg/dL   Creatinine, Ser 3.00 (*) 0.50 - 1.35 mg/dL   Calcium 9.3  8.4 - 10.5 mg/dL   GFR calc non Af Amer 25 (*) >90 mL/min   GFR calc Af Amer 29 (*) >90 mL/min  CBC WITH DIFFERENTIAL     Status: Abnormal   Collection Time    04/25/13  4:42 AM      Result Value Ref Range   WBC 11.3 (*) 4.0 - 10.5 K/uL   RBC 5.42  4.22 - 5.81 MIL/uL   Hemoglobin 12.8 (*) 13.0 - 17.0 g/dL   HCT 39.9  39.0 - 52.0 %   MCV 73.6 (*) 78.0 - 100.0 fL   MCH 23.6 (*) 26.0 - 34.0 pg   MCHC 32.1  30.0 - 36.0 g/dL   RDW 17.7 (*) 11.5 - 15.5 %   Platelets 305  150 - 400 K/uL   Neutrophils Relative % 67  43 - 77 %   Neutro Abs 7.6  1.7 - 7.7 K/uL   Lymphocytes Relative 20  12 - 46 %   Lymphs  Abs 2.2  0.7 - 4.0 K/uL   Monocytes Relative 11  3 - 12 %   Monocytes Absolute 1.2 (*) 0.1 - 1.0 K/uL    Eosinophils Relative 2  0 - 5 %   Eosinophils Absolute 0.2  0.0 - 0.7 K/uL   Basophils Relative 0  0 - 1 %   Basophils Absolute 0.0  0.0 - 0.1 K/uL    Imaging Studies: Dg Chest 2 View  04/25/2013   CLINICAL DATA:  Shortness of breath  EXAM: CHEST  2 VIEW  COMPARISON:  03/19/2013  FINDINGS: Chronic cardiomegaly. Unchanged upper mediastinal contours when accounting for hypoaeration. There is interstitial crowding and subsegmental atelectasis at the bases. In the lateral projection, there could be lower lobe pneumonia. No effusion or pneumothorax.  IMPRESSION: Low lung volumes with mild atelectasis. Cannot exclude early pneumonia at the bases.   Electronically Signed   By: Jorje Guild M.D.   On: 04/25/2013 04:57   5:15 AM Cough improved, air movement improved after Xopenex neb treatment. Brisk diuresis after IV Lasix. Patient's headache has resolved. Patient's BUN and creatinine though elevated are stable for this patient. The patient has a refill his carvedilol and furosemide prescriptions. He states he cannot afford his Benicar but will request samples from his PCP later today.  Wynetta Fines, MD 04/25/13 [phone]

## 2013-04-25 NOTE — ED Notes (Signed)
Patient transported to X-ray 

## 2013-04-28 ENCOUNTER — Telehealth: Payer: Self-pay | Admitting: *Deleted

## 2013-04-28 ENCOUNTER — Ambulatory Visit (INDEPENDENT_AMBULATORY_CARE_PROVIDER_SITE_OTHER): Payer: Managed Care, Other (non HMO) | Admitting: Physician Assistant

## 2013-04-28 ENCOUNTER — Encounter: Payer: Self-pay | Admitting: Physician Assistant

## 2013-04-28 VITALS — BP 132/83 | HR 95 | Temp 98.3°F | Resp 18 | Ht 68.0 in | Wt 367.0 lb

## 2013-04-28 DIAGNOSIS — J189 Pneumonia, unspecified organism: Secondary | ICD-10-CM

## 2013-04-28 DIAGNOSIS — K5900 Constipation, unspecified: Secondary | ICD-10-CM

## 2013-04-28 DIAGNOSIS — I509 Heart failure, unspecified: Secondary | ICD-10-CM

## 2013-04-28 DIAGNOSIS — K5904 Chronic idiopathic constipation: Secondary | ICD-10-CM

## 2013-04-28 DIAGNOSIS — I10 Essential (primary) hypertension: Secondary | ICD-10-CM

## 2013-04-28 LAB — CBC WITH DIFFERENTIAL/PLATELET
BASOS ABS: 0 10*3/uL (ref 0.0–0.1)
Basophils Relative: 0.3 % (ref 0.0–3.0)
Eosinophils Absolute: 0.3 10*3/uL (ref 0.0–0.7)
Eosinophils Relative: 2.5 % (ref 0.0–5.0)
HCT: 40.8 % (ref 39.0–52.0)
Hemoglobin: 13 g/dL (ref 13.0–17.0)
Lymphocytes Relative: 14.3 % (ref 12.0–46.0)
Lymphs Abs: 1.7 10*3/uL (ref 0.7–4.0)
MCHC: 31.9 g/dL (ref 30.0–36.0)
MCV: 73.4 fl — AB (ref 78.0–100.0)
MONO ABS: 1 10*3/uL (ref 0.1–1.0)
Monocytes Relative: 8.4 % (ref 3.0–12.0)
Neutro Abs: 9 10*3/uL — ABNORMAL HIGH (ref 1.4–7.7)
Neutrophils Relative %: 74.5 % (ref 43.0–77.0)
PLATELETS: 322 10*3/uL (ref 150.0–400.0)
RBC: 5.56 Mil/uL (ref 4.22–5.81)
RDW: 17.7 % — ABNORMAL HIGH (ref 11.5–14.6)
WBC: 12.1 10*3/uL — ABNORMAL HIGH (ref 4.5–10.5)

## 2013-04-28 LAB — BASIC METABOLIC PANEL
BUN: 33 mg/dL — AB (ref 6–23)
CO2: 26 mEq/L (ref 19–32)
Calcium: 9.2 mg/dL (ref 8.4–10.5)
Chloride: 102 mEq/L (ref 96–112)
Creatinine, Ser: 3.2 mg/dL — ABNORMAL HIGH (ref 0.4–1.5)
GFR: 28.42 mL/min — AB (ref >60.00)
Glucose, Bld: 96 mg/dL (ref 70–99)
Potassium: 3.5 mEq/L (ref 3.5–5.1)
Sodium: 140 mEq/L (ref 135–145)

## 2013-04-28 MED ORDER — LUBIPROSTONE 24 MCG PO CAPS: 24.0000 ug | ORAL_CAPSULE | Freq: Every day | ORAL | Status: DC

## 2013-04-28 MED ORDER — LOSARTAN POTASSIUM 50 MG PO TABS: 25.0000 mg | ORAL_TABLET | Freq: Every day | ORAL | Status: DC

## 2013-04-28 MED ORDER — SPIRONOLACTONE 25 MG PO TABS: 25.0000 mg | ORAL_TABLET | Freq: Every day | ORAL | Status: DC

## 2013-04-28 MED ORDER — HYDROCOD POLST-CHLORPHEN POLST 10-8 MG/5ML PO LQCR: 5.0000 mL | Freq: Two times a day (BID) | ORAL | Status: DC | PRN

## 2013-04-28 NOTE — Progress Notes (Signed)
Patient presents to clinic today for ER follow-up.  Patient was seen on 04/25/13 in the ER and diagnosed with Hypertension and Community Acquired Pneumonia.    For Hypertension -- Patient had ran out of blood pressure medications.  Medications were restarted by ER physician.  Patient states he has been taking them as directed.  BP 132/83 in clinic today.  Patient does need refill of benicar.  Denies chest pain, SOB, blurry vision, headache, lightheadedness or dizziness.  For CAP -- Patient endorses taking antibiotic as directed.  Has 1 more day of medication.  Still having cough but much improved.  Denies pleuritic pain or SOB.  Endorses lingering fatigue.  For CHF -- ER physician increased Lasix to 40 mg BID.  Patient is taking Aldactone as directed.  Endorses good urinary output.  Patient complains of constipation despite fiber supplement, stool softener and OTC laxative.  Denies tenesmus, melena or hematochezia.  Last BM 1 day ago but hard stool.  Patient still has not seen Nephrologist for his advanced CKD.  Patient has also not followed up with his Cardiologist as directed.   Past Medical History  Diagnosis Date  . CHF (congestive heart failure)   . 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 Chronic kidney disease, stage 3, mod decreased GFR   . Gout   . Hyperlipidemia   . Obesity   . Hx of migraine headaches     Current Outpatient Prescriptions on File Prior to Visit  Medication Sig Dispense Refill  . azithromycin (ZITHROMAX) 250 MG tablet Take 2 tablets together the first day, then 1 every day thereafter until finished.  6 tablet  0  . carvedilol (COREG) 25 MG tablet Take 1 tablet (25 mg total) by mouth 2 (two) times daily with a meal.  60 tablet  1  . EQ FIBER SUPPLEMENT PO Take by mouth daily.      . furosemide (LASIX) 40 MG tablet Take 1 tablet (40 mg total) by mouth 2 (two) times daily.  60 tablet  0  . hydrALAZINE (APRESOLINE) 100  MG tablet Take 1 tablet (100 mg total) by mouth 3 (three) times daily.  90 tablet  1  . NON FORMULARY Take 2 capsules by mouth daily. Colon Clenz Herbal       No current facility-administered medications on file prior to visit.    No Known Allergies  Family History  Problem Relation Age of Onset  . Hypertension Mother   . Diabetes Neg Hx   . Heart attack Neg Hx   . Hyperlipidemia Mother   . Sudden death Neg Hx   . Heart disease Mother   . Hypertension Maternal Grandmother   . Stroke Maternal Grandfather   . Stroke Maternal Grandmother   . Hypertension Maternal Grandfather   . Heart disease Maternal Grandmother   . Heart disease Maternal Grandfather   . Hyperlipidemia Maternal Grandmother   . Hyperlipidemia Maternal Grandfather   . Cancer - Other Paternal Grandmother   . Alzheimer's disease Maternal Grandfather   . Healthy Brother     x2  . Healthy Sister     x2  . Healthy Son     x2  . Healthy Daughter     x1    History   Social History  . Marital Status: Legally Separated    Spouse Name: N/A    Number of Children: N/A  . Years of Education: N/A   Social History Main Topics  .  Smoking status: Never Smoker   . Smokeless tobacco: Never Used  . Alcohol Use: Yes     Comment: socially -- once a month  . Drug Use: No  . Sexual Activity: None   Other Topics Concern  . None   Social History Narrative  . None   Review of Systems - See HPI.  All other ROS are negative.  BP 132/83  Pulse 95  Temp(Src) 98.3 F (36.8 C) (Oral)  Resp 18  Ht 5' 8" (1.727 m)  Wt 367 lb (166.47 kg)  BMI 55.82 kg/m2  SpO2 96%  Physical Exam  Vitals reviewed. Constitutional: He is oriented to person, place, and time and well-developed, well-nourished, and in no distress.  HENT:  Head: Normocephalic and atraumatic.  Right Ear: External ear normal.  Left Ear: External ear normal.  Nose: Nose normal.  Mouth/Throat: Oropharynx is clear and moist. No oropharyngeal exudate.   Eyes: Conjunctivae are normal. Pupils are equal, round, and reactive to light.  Neck: Neck supple.  Cardiovascular: Normal rate, regular rhythm, normal heart sounds and intact distal pulses.   Pulses:      Dorsalis pedis pulses are 2+ on the right side, and 2+ on the left side.       Posterior tibial pulses are 2+ on the right side, and 2+ on the left side.  Trace edema noted of bilateral lower extremities.  Pulmonary/Chest: Effort normal and breath sounds normal. No respiratory distress. He has no wheezes. He has no rales. He exhibits no tenderness.  Abdominal: Soft. Bowel sounds are normal. He exhibits no distension and no mass. There is no tenderness.  Lymphadenopathy:    He has no cervical adenopathy.  Neurological: He is alert and oriented to person, place, and time.  Skin: Skin is warm and dry. No rash noted.  Psychiatric: Affect normal.    Recent Results (from the past 2160 hour(s))  CBC WITH DIFFERENTIAL     Status: Abnormal   Collection Time    03/10/13  3:35 PM      Result Value Ref Range   WBC 9.4  4.0 - 10.5 K/uL   RBC 5.84 (*) 4.22 - 5.81 MIL/uL   Hemoglobin 13.7  13.0 - 17.0 g/dL   HCT 42.7  39.0 - 52.0 %   MCV 73.1 (*) 78.0 - 100.0 fL   MCH 23.5 (*) 26.0 - 34.0 pg   MCHC 32.1  30.0 - 36.0 g/dL   RDW 16.6 (*) 11.5 - 15.5 %   Platelets 281  150 - 400 K/uL   Neutrophils Relative % 66  43 - 77 %   Neutro Abs 6.2  1.7 - 7.7 K/uL   Lymphocytes Relative 20  12 - 46 %   Lymphs Abs 1.9  0.7 - 4.0 K/uL   Monocytes Relative 12  3 - 12 %   Monocytes Absolute 1.1 (*) 0.1 - 1.0 K/uL   Eosinophils Relative 2  0 - 5 %   Eosinophils Absolute 0.2  0.0 - 0.7 K/uL   Basophils Relative 0  0 - 1 %   Basophils Absolute 0.0  0.0 - 0.1 K/uL   Smear Review Criteria for review not met    HEPATIC FUNCTION PANEL     Status: None   Collection Time    03/10/13  3:35 PM      Result Value Ref Range   Total Bilirubin 0.5  0.2 - 1.2 mg/dL   Comment: ** Please note change in reference  range(s). **  Bilirubin, Direct 0.1  0.0 - 0.3 mg/dL   Indirect Bilirubin 0.4  0.2 - 1.2 mg/dL   Comment: ** Please note change in reference range(s). **   Alkaline Phosphatase 62  39 - 117 U/L   AST 25  0 - 37 U/L   ALT 22  0 - 53 U/L   Total Protein 7.5  6.0 - 8.3 g/dL   Albumin 4.2  3.5 - 5.2 g/dL  TSH     Status: None   Collection Time    03/10/13  3:35 PM      Result Value Ref Range   TSH 2.756  0.350 - 4.500 uIU/mL  URINALYSIS, ROUTINE W REFLEX MICROSCOPIC     Status: Abnormal   Collection Time    03/10/13  3:35 PM      Result Value Ref Range   Color, Urine YELLOW  YELLOW   APPearance CLEAR  CLEAR   Specific Gravity, Urine 1.014  1.005 - 1.030   pH 5.5  5.0 - 8.0   Glucose, UA NEG  NEG mg/dL   Bilirubin Urine NEG  NEG   Ketones, ur NEG  NEG mg/dL   Hgb urine dipstick NEG  NEG   Protein, ur 100 (*) NEG mg/dL   Urobilinogen, UA 0.2  0.0 - 1.0 mg/dL   Nitrite NEG  NEG   Leukocytes, UA NEG  NEG  LIPID PANEL     Status: Abnormal   Collection Time    03/10/13  3:35 PM      Result Value Ref Range   Cholesterol 207 (*) 0 - 200 mg/dL   Comment: ATP III Classification:           < 200        mg/dL        Desirable          200 - 239     mg/dL        Borderline High          >= 240        mg/dL        High         Triglycerides 200 (*) <150 mg/dL   HDL 39 (*) >39 mg/dL   Total CHOL/HDL Ratio 5.3     VLDL 40  0 - 40 mg/dL   LDL Cholesterol 128 (*) 0 - 99 mg/dL   Comment:       Total Cholesterol/HDL Ratio:CHD Risk                            Coronary Heart Disease Risk Table                                            Men       Women              1/2 Average Risk              3.4        3.3                  Average Risk              5.0        4.4               2X  Average Risk              9.6        7.1               3X Average Risk             23.4       11.0     Use the calculated Patient Ratio above and the CHD Risk table      to determine the patient's CHD Risk.      ATP III Classification (LDL):           < 100        mg/dL         Optimal          100 - 129     mg/dL         Near or Above Optimal          130 - 159     mg/dL         Borderline High          160 - 189     mg/dL         High           > 190        mg/dL         Very High        HEMOGLOBIN A1C     Status: Abnormal   Collection Time    03/10/13  3:35 PM      Result Value Ref Range   Hemoglobin A1C 5.8 (*) <5.7 %   Comment:                                                                            According to the ADA Clinical Practice Recommendations for 2011, when     HbA1c is used as a screening test:             >=6.5%   Diagnostic of Diabetes Mellitus                (if abnormal result is confirmed)           5.7-6.4%   Increased risk of developing Diabetes Mellitus           References:Diagnosis and Classification of Diabetes Mellitus,Diabetes     EYCX,4481,85(UDJSH 1):S62-S69 and Standards of Medical Care in             Diabetes - 2011,Diabetes [id number],3785,88 (Suppl 1):S11-S61.         Mean Plasma Glucose 120 (*) <117 mg/dL  BASIC METABOLIC PANEL WITH GFR     Status: Abnormal   Collection Time    03/10/13  3:35 PM      Result Value Ref Range   Sodium 138  135 - 145 mEq/L   Potassium 3.5  3.5 - 5.3 mEq/L   Chloride 100  96 - 112 mEq/L   CO2 27  19 - 32 mEq/L   Glucose, Bld 84  70 - 99 mg/dL   BUN 27 (*) 6 - 23 mg/dL   Creat 2.62 (*) 0.50 - 1.35 mg/dL   Calcium 9.6  8.4 -  10.5 mg/dL   GFR, Est African American 35 (*)    GFR, Est Non African American 30 (*)    Comment:       The estimated GFR is a calculation valid for adults (>=61 years old)     that uses the CKD-EPI algorithm to adjust for age and sex. It is       not to be used for children, pregnant women, hospitalized patients,        patients on dialysis, or with rapidly changing kidney function.     According to the NKDEP, eGFR >89 is normal, 60-89 shows mild     impairment, 30-59 shows moderate impairment,  15-29 shows severe     impairment and <15 is ESRD.        PTH, INTACT AND CALCIUM     Status: Abnormal   Collection Time    03/10/13  3:35 PM      Result Value Ref Range   PTH 172.2 (*) 14.0 - 72.0 pg/mL   Calcium 9.6  8.4 - 10.5 mg/dL  URINALYSIS, MICROSCOPIC ONLY     Status: None   Collection Time    03/10/13  3:35 PM      Result Value Ref Range   Squamous Epithelial / LPF NONE SEEN  RARE   Crystals NONE SEEN  NONE SEEN   Casts NONE SEEN  NONE SEEN   WBC, UA 0-2  <3 WBC/hpf   RBC / HPF 0-2  <3 RBC/hpf   Bacteria, UA NONE SEEN  RARE  CBC WITH DIFFERENTIAL     Status: Abnormal   Collection Time    03/19/13  7:00 PM      Result Value Ref Range   WBC 10.6 (*) 4.0 - 10.5 K/uL   RBC 5.80  4.22 - 5.81 MIL/uL   Hemoglobin 13.7  13.0 - 17.0 g/dL   HCT 42.2  39.0 - 52.0 %   MCV 72.8 (*) 78.0 - 100.0 fL   MCH 23.6 (*) 26.0 - 34.0 pg   MCHC 32.5  30.0 - 36.0 g/dL   RDW 17.9 (*) 11.5 - 15.5 %   Platelets 299  150 - 400 K/uL   Neutrophils Relative % 66  43 - 77 %   Neutro Abs 7.0  1.7 - 7.7 K/uL   Lymphocytes Relative 17  12 - 46 %   Lymphs Abs 1.8  0.7 - 4.0 K/uL   Monocytes Relative 15 (*) 3 - 12 %   Monocytes Absolute 1.6 (*) 0.1 - 1.0 K/uL   Eosinophils Relative 2  0 - 5 %   Eosinophils Absolute 0.2  0.0 - 0.7 K/uL   Basophils Relative 0  0 - 1 %   Basophils Absolute 0.0  0.0 - 0.1 K/uL  SEDIMENTATION RATE     Status: None   Collection Time    03/19/13  7:00 PM      Result Value Ref Range   Sed Rate 13  0 - 16 mm/hr  PRO B NATRIURETIC PEPTIDE     Status: Abnormal   Collection Time    03/19/13  7:00 PM      Result Value Ref Range   Pro B Natriuretic peptide (BNP) 344.5 (*) 0 - 125 pg/mL  COMPREHENSIVE METABOLIC PANEL     Status: Abnormal   Collection Time    03/19/13  8:45 PM      Result Value Ref Range   Sodium 143  137 - 147 mEq/L  Potassium 3.5 (*) 3.7 - 5.3 mEq/L   Chloride 101  96 - 112 mEq/L   CO2 29  19 - 32 mEq/L   Glucose, Bld 102 (*) 70 - 99 mg/dL   BUN  25 (*) 6 - 23 mg/dL   Creatinine, Ser 3.00 (*) 0.50 - 1.35 mg/dL   Calcium 9.4  8.4 - 10.5 mg/dL   Total Protein 8.3  6.0 - 8.3 g/dL   Albumin 3.6  3.5 - 5.2 g/dL   AST 27  0 - 37 U/L   ALT 25  0 - 53 U/L   Alkaline Phosphatase 69  39 - 117 U/L   Total Bilirubin 0.6  0.3 - 1.2 mg/dL   GFR calc non Af Amer 25 (*) >90 mL/min   GFR calc Af Amer 29 (*) >90 mL/min   Comment: (NOTE)     The eGFR has been calculated using the CKD EPI equation.     This calculation has not been validated in all clinical situations.     eGFR's persistently <90 mL/min signify possible Chronic Kidney     Disease.  BASIC METABOLIC PANEL     Status: Abnormal   Collection Time    04/25/13  4:42 AM      Result Value Ref Range   Sodium 144  137 - 147 mEq/L   Potassium 3.7  3.7 - 5.3 mEq/L   Chloride 104  96 - 112 mEq/L   CO2 27  19 - 32 mEq/L   Glucose, Bld 113 (*) 70 - 99 mg/dL   BUN 26 (*) 6 - 23 mg/dL   Creatinine, Ser 3.00 (*) 0.50 - 1.35 mg/dL   Calcium 9.3  8.4 - 10.5 mg/dL   GFR calc non Af Amer 25 (*) >90 mL/min   GFR calc Af Amer 29 (*) >90 mL/min   Comment: (NOTE)     The eGFR has been calculated using the CKD EPI equation.     This calculation has not been validated in all clinical situations.     eGFR's persistently <90 mL/min signify possible Chronic Kidney     Disease.  CBC WITH DIFFERENTIAL     Status: Abnormal   Collection Time    04/25/13  4:42 AM      Result Value Ref Range   WBC 11.3 (*) 4.0 - 10.5 K/uL   RBC 5.42  4.22 - 5.81 MIL/uL   Hemoglobin 12.8 (*) 13.0 - 17.0 g/dL   HCT 39.9  39.0 - 52.0 %   MCV 73.6 (*) 78.0 - 100.0 fL   MCH 23.6 (*) 26.0 - 34.0 pg   MCHC 32.1  30.0 - 36.0 g/dL   RDW 17.7 (*) 11.5 - 15.5 %   Platelets 305  150 - 400 K/uL   Neutrophils Relative % 67  43 - 77 %   Neutro Abs 7.6  1.7 - 7.7 K/uL   Lymphocytes Relative 20  12 - 46 %   Lymphs Abs 2.2  0.7 - 4.0 K/uL   Monocytes Relative 11  3 - 12 %   Monocytes Absolute 1.2 (*) 0.1 - 1.0 K/uL   Eosinophils  Relative 2  0 - 5 %   Eosinophils Absolute 0.2  0.0 - 0.7 K/uL   Basophils Relative 0  0 - 1 %   Basophils Absolute 0.0  0.0 - 0.1 K/uL  CBC WITH DIFFERENTIAL     Status: Abnormal   Collection Time    04/28/13 10:06 AM  Result Value Ref Range   WBC 12.1 (*) 4.5 - 10.5 K/uL   RBC 5.56  4.22 - 5.81 Mil/uL   Hemoglobin 13.0  13.0 - 17.0 g/dL   HCT 40.8  39.0 - 52.0 %   MCV 73.4 (*) 78.0 - 100.0 fl   MCHC 31.9  30.0 - 36.0 g/dL   RDW 17.7 (*) 11.5 - 14.6 %   Platelets 322.0  150.0 - 400.0 K/uL   Neutrophils Relative % 74.5  43.0 - 77.0 %   Lymphocytes Relative 14.3  12.0 - 46.0 %   Monocytes Relative 8.4  3.0 - 12.0 %   Eosinophils Relative 2.5  0.0 - 5.0 %   Basophils Relative 0.3  0.0 - 3.0 %   Neutro Abs 9.0 (*) 1.4 - 7.7 K/uL   Lymphs Abs 1.7  0.7 - 4.0 K/uL   Monocytes Absolute 1.0  0.1 - 1.0 K/uL   Eosinophils Absolute 0.3  0.0 - 0.7 K/uL   Basophils Absolute 0.0  0.0 - 0.1 K/uL  BASIC METABOLIC PANEL     Status: Abnormal   Collection Time    04/28/13 10:06 AM      Result Value Ref Range   Sodium 140  135 - 145 mEq/L   Potassium 3.5  3.5 - 5.1 mEq/L   Chloride 102  96 - 112 mEq/L   CO2 26  19 - 32 mEq/L   Glucose, Bld 96  70 - 99 mg/dL   BUN 33 (*) 6 - 23 mg/dL   Creatinine, Ser 3.2 (*) 0.4 - 1.5 mg/dL   Calcium 9.2  8.4 - 10.5 mg/dL   GFR 28.42 (*) >60.00 mL/min   Assessment/Plan: Essential hypertension, benign Cannot afford Benicar.  Will Rx losartan.  Continue other medications.  Follow-up in 2 weeks.  Will recheck CBC and BMP today.  Patient to follow-up with Cardiologist.   CHF (congestive heart failure) Will obtain BMP.  Can continue Lasix BID for now.  CAP (community acquired pneumonia) Vitals and lung exam unremarkable.  Finish antibiotic.  Increase fluid intake.  Rest. Tussionex for cough.  Follow-up in 1 week.  Chronic idiopathic constipation Rx Amitiza. Discussed possible side effects of medication.  Patient voices understanding. Continue fiber  supplement and stool softener as needed.  Follow-up in 1 week.

## 2013-04-28 NOTE — Telephone Encounter (Signed)
Called in Losartan 25 mg.  Tablet is a 50 mg tablet but patient is to take 1/2 tablet daily.  This should be more cost-effective than the Benicar.

## 2013-04-28 NOTE — Telephone Encounter (Signed)
Notified pt and he voices understanding. 

## 2013-04-28 NOTE — Patient Instructions (Signed)
Please finish antibiotic.  Increase fluid intake.  Rest.  Use Tussionex for cough.  Use Amitza for contsipation.  Keep up with the fiber supplement.  Continue blood pressure medications as prescribed.  Please go to [place] office for benicar samples to tie you over until you new insurance kicks in.  Please return to clinic in 2 weeks for follow-up.  Return sooner if you need anything.  I will call you with your lab results.

## 2013-04-28 NOTE — Telephone Encounter (Signed)
Message copied by Ronny Flurry on Mon Apr 28, 2013 11:17 AM ------      Message from: Frank Coffey      Created: Mon Apr 28, 2013 10:14 AM       Patient is coming by for samples of Benicar 40mg .  Please allow him to have up to 1 month supply.  He has new insurance that will go into effect in 1 month. ------

## 2013-04-28 NOTE — Telephone Encounter (Signed)
We do not have regular Benicar but we do have benicar hct 40/12.5mg .  Pt left and I told him we would call him with further recommendations.  Please advise.

## 2013-04-30 DIAGNOSIS — K5904 Chronic idiopathic constipation: Secondary | ICD-10-CM | POA: Insufficient documentation

## 2013-04-30 DIAGNOSIS — J189 Pneumonia, unspecified organism: Secondary | ICD-10-CM | POA: Insufficient documentation

## 2013-04-30 NOTE — Assessment & Plan Note (Signed)
>>  ASSESSMENT AND PLAN FOR BENIGN HYPERTENSION WITH CHRONIC KIDNEY DISEASE, STAGE IV ([facility]) WRITTEN ON 04/30/2013  5:28 PM BY Farris Hong, PA-C  Cannot afford Benicar.  Will Rx losartan.  Continue other medications.  Follow-up in 2 weeks.  Will recheck CBC and BMP today.  Patient to follow-up with Cardiologist.

## 2013-04-30 NOTE — Assessment & Plan Note (Addendum)
Cannot afford Benicar.  Will Rx losartan.  Continue other medications.  Follow-up in 2 weeks.  Will recheck CBC and BMP today.  Patient to follow-up with Cardiologist.

## 2013-04-30 NOTE — Assessment & Plan Note (Signed)
Rx Amitiza. Discussed possible side effects of medication.  Patient voices understanding. Continue fiber supplement and stool softener as needed.  Follow-up in 1 week.

## 2013-04-30 NOTE — Assessment & Plan Note (Addendum)
Vitals and lung exam unremarkable.  Finish antibiotic.  Increase fluid intake.  Rest. Tussionex for cough.  Follow-up in 1 week.

## 2013-04-30 NOTE — Assessment & Plan Note (Signed)
Will obtain BMP.  Can continue Lasix BID for now.

## 2013-05-11 ENCOUNTER — Other Ambulatory Visit: Payer: Self-pay | Admitting: Physician Assistant

## 2013-05-12 NOTE — Telephone Encounter (Signed)
Rx request to pharmacy/[facility]  

## 2013-06-04 ENCOUNTER — Other Ambulatory Visit: Payer: Self-pay | Admitting: Physician Assistant

## 2013-06-04 NOTE — Telephone Encounter (Signed)
Pt request call once sent to Pharmacy, has been out of his meds for 2 days and is experiencing headaches.

## 2013-06-05 NOTE — Telephone Encounter (Signed)
Rx request to pharmacy/[facility]  

## 2013-06-05 NOTE — Telephone Encounter (Signed)
hydrazaline 100 mg  taqke 1 3 times per day  Please call to [facility] in [place]  Please call the patient [phone] as soon as this has been called in  He has been out for 3 days

## 2013-07-30 ENCOUNTER — Other Ambulatory Visit: Payer: Self-pay | Admitting: Physician Assistant

## 2013-07-30 NOTE — Telephone Encounter (Signed)
Rx request to pharmacy/[facility]  

## 2013-08-26 ENCOUNTER — Encounter: Payer: Self-pay | Admitting: Family

## 2013-08-26 ENCOUNTER — Ambulatory Visit (INDEPENDENT_AMBULATORY_CARE_PROVIDER_SITE_OTHER): Payer: Managed Care, Other (non HMO) | Admitting: Family

## 2013-08-26 VITALS — BP 120/70 | HR 94 | Temp 98.5°F | Resp 16 | Ht 68.0 in | Wt 386.1 lb

## 2013-08-26 DIAGNOSIS — N183 Chronic kidney disease, stage 3 unspecified: Secondary | ICD-10-CM

## 2013-08-26 DIAGNOSIS — R635 Abnormal weight gain: Secondary | ICD-10-CM

## 2013-08-26 DIAGNOSIS — G4733 Obstructive sleep apnea (adult) (pediatric): Secondary | ICD-10-CM

## 2013-08-26 DIAGNOSIS — R5382 Chronic fatigue, unspecified: Secondary | ICD-10-CM

## 2013-08-26 DIAGNOSIS — I509 Heart failure, unspecified: Secondary | ICD-10-CM

## 2013-08-26 DIAGNOSIS — R5383 Other fatigue: Secondary | ICD-10-CM

## 2013-08-26 DIAGNOSIS — R5381 Other malaise: Secondary | ICD-10-CM

## 2013-08-26 LAB — CBC WITH DIFFERENTIAL/PLATELET
Basophils Absolute: 0 10*3/uL (ref 0.0–0.1)
Basophils Relative: 0 % (ref 0–1)
EOS PCT: 2 % (ref 0–5)
Eosinophils Absolute: 0.2 10*3/uL (ref 0.0–0.7)
HCT: 39.1 % (ref 39.0–52.0)
Hemoglobin: 12.4 g/dL — ABNORMAL LOW (ref 13.0–17.0)
LYMPHS ABS: 2.1 10*3/uL (ref 0.7–4.0)
LYMPHS PCT: 18 % (ref 12–46)
MCH: 21.8 pg — ABNORMAL LOW (ref 26.0–34.0)
MCHC: 31.7 g/dL (ref 30.0–36.0)
MCV: 68.6 fL — ABNORMAL LOW (ref 78.0–100.0)
Monocytes Absolute: 1.1 10*3/uL — ABNORMAL HIGH (ref 0.1–1.0)
Monocytes Relative: 9 % (ref 3–12)
Neutro Abs: 8.4 10*3/uL — ABNORMAL HIGH (ref 1.7–7.7)
Neutrophils Relative %: 71 % (ref 43–77)
Platelets: 359 10*3/uL (ref 150–400)
RBC: 5.7 MIL/uL (ref 4.22–5.81)
RDW: 17.3 % — ABNORMAL HIGH (ref 11.5–15.5)
WBC: 11.8 10*3/uL — AB (ref 4.0–10.5)

## 2013-08-26 LAB — BASIC METABOLIC PANEL WITH GFR
BUN: 23 mg/dL (ref 6–23)
CALCIUM: 9.3 mg/dL (ref 8.4–10.5)
CO2: 28 meq/L (ref 19–32)
Chloride: 101 mEq/L (ref 96–112)
Creat: 2.99 mg/dL — ABNORMAL HIGH (ref 0.50–1.35)
GFR, Est African American: 30 mL/min — ABNORMAL LOW
GFR, Est Non African American: 26 mL/min — ABNORMAL LOW
Glucose, Bld: 100 mg/dL — ABNORMAL HIGH (ref 70–99)
POTASSIUM: 3.6 meq/L (ref 3.5–5.3)
SODIUM: 139 meq/L (ref 135–145)

## 2013-08-26 LAB — TSH: TSH: 2.858 u[IU]/mL (ref 0.350–4.500)

## 2013-08-26 LAB — BRAIN NATRIURETIC PEPTIDE: Brain Natriuretic Peptide: 153.6 pg/mL — ABNORMAL HIGH (ref 0.0–100.0)

## 2013-08-26 NOTE — Progress Notes (Signed)
Subjective:    Patient ID: Frank Coffey, male    DOB: Jun 09, 1980, 32 y.o.   MRN: [id number]  HPI  Frank Coffey is a 33 yr old male with pmhx significant for osa, CKD stage 3, and CHF, who presents today with chief complaint of difficulty staying asleep. Wife reports + snoring.  Feels unrested when he wakes up. Reports sob is worse than normal. He attributes this to weight gain. Reports that 4 years ago reports that he did try cpap but that he got panicky with the full face mask. Reports feeling weak, no energy, diet is poor.  Motivated to lose weight. He is willing to retry cpap and hopefully nasal cpap he says.  Weight gain- reports that he has recently increased his sweet tea and soda intake "to stay awake."  He attributes much of his weight gain to this.   Wt Readings from Last 3 Encounters:  08/26/13 386 lb 1.3 oz (175.125 kg)  04/28/13 367 lb (166.47 kg)  04/25/13 350 lb (158.759 kg)   CHF- his is maintained on lasix. Reports that his LE edema is at baseline. He has seen Dr. Wyline Copas in the past.    CKD- he is requesting referral to nephrology.     Review of Systems See HPI  Past Medical History  Diagnosis Date  . CHF (congestive heart failure)   . 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 Chronic kidney disease, stage 3, mod decreased GFR   . Gout   . Hyperlipidemia   . Obesity   . Hx of migraine headaches     History   Social History  . Marital Status: Legally Separated    Spouse Name: N/A    Number of Children: N/A  . Years of Education: N/A   Occupational History  . Not on file.   Social History Main Topics  . Smoking status: Never Smoker   . Smokeless tobacco: Never Used  . Alcohol Use: Yes     Comment: socially -- once every few months  . Drug Use: No  . Sexual Activity: Not on file   Other Topics Concern  . Not on file   Social History Narrative  . No narrative on file    Past Surgical History  Procedure  Laterality Date  . Cholecystectomy    . Retinal detachment repair w/ scleral buckle le      Left    Family History  Problem Relation Age of Onset  . Hypertension Mother   . Diabetes Neg Hx   . Heart attack Neg Hx   . Hyperlipidemia Mother   . Sudden death Neg Hx   . Heart disease Mother   . Hypertension Maternal Grandmother   . Stroke Maternal Grandfather   . Stroke Maternal Grandmother   . Hypertension Maternal Grandfather   . Heart disease Maternal Grandmother   . Heart disease Maternal Grandfather   . Hyperlipidemia Maternal Grandmother   . Hyperlipidemia Maternal Grandfather   . Cancer - Other Paternal Grandmother   . Alzheimer's disease Maternal Grandfather   . Healthy Brother     x2  . Healthy Sister     x2  . Healthy Son     x2  . Healthy Daughter     x1    No Known Allergies  Current Outpatient Prescriptions on File Prior to Visit  Medication Sig Dispense Refill  . carvedilol (COREG) 25 MG tablet Take 1 tablet (25  mg total) by mouth 2 (two) times daily with a meal.  60 tablet  1  . EQ FIBER SUPPLEMENT PO Take by mouth daily.      . furosemide (LASIX) 40 MG tablet TAKE 1 TABLET BY MOUTH TWICE DAILY  60 tablet  0  . hydrALAZINE (APRESOLINE) 100 MG tablet TAKE 1 TABLET BY MOUTH THREE TIMES DAILY  90 tablet  0  . NON FORMULARY Take 2 capsules by mouth daily. Colon Clenz Herbal      . spironolactone (ALDACTONE) 25 MG tablet Take 1 tablet (25 mg total) by mouth daily.  30 tablet  1  . losartan (COZAAR) 50 MG tablet Take 0.5 tablets (25 mg total) by mouth daily.  30 tablet  1   No current facility-administered medications on file prior to visit.    BP 120/70  Pulse 94  Temp(Src) 98.5 F (36.9 C) (Oral)  Resp 16  Ht 5\' 8"  (1.727 m)  Wt 386 lb 1.3 oz (175.125 kg)  BMI 58.72 kg/m2  SpO2 94%       Objective:   Physical Exam  Constitutional: He is oriented to person, place, and time. He appears well-developed and well-nourished. No distress.  Morbidly  obese AA male.  HENT:  Head: Normocephalic and atraumatic.  Cardiovascular: Normal rate and regular rhythm.   No murmur heard. Pulmonary/Chest: Effort normal and breath sounds normal. No respiratory distress. He has no wheezes. He has no rales. He exhibits no tenderness.  Musculoskeletal:  1-2+ bilateral LE edema  Neurological: He is alert and oriented to person, place, and time.  Skin: Skin is warm and dry.  Psychiatric: He has a normal mood and affect. His behavior is normal. Judgment and thought content normal.          Assessment & Plan:

## 2013-08-26 NOTE — Progress Notes (Signed)
Pre visit review using our clinic review tool, if applicable. No additional management support is needed unless otherwise documented below in the visit note. 

## 2013-08-26 NOTE — Assessment & Plan Note (Signed)
Discussed importance of diet, exercise, weight loss.  Obtain TSH to rule out underlying thyroid etiology.

## 2013-08-26 NOTE — Assessment & Plan Note (Signed)
Obtain follow up bmet, refer to nephrology for ongoing care.

## 2013-08-26 NOTE — Assessment & Plan Note (Signed)
Clinically stable.  He would like to see cardiologist in our system. Will refer to Dr. Stanford Breed.

## 2013-08-26 NOTE — Assessment & Plan Note (Signed)
Will refer for split night study. Suspect that his sleeplessness is due to untreated OSA.

## 2013-08-26 NOTE — Patient Instructions (Addendum)
Please complete lab work prior to leaving. You will be contacted about the following appointments: Nephrology, Cardiology, and sleep study. Let us know if you have not heard back about these appointments in 1 week. Follow up with Einar Pheasant in 1 month.

## 2013-08-27 ENCOUNTER — Other Ambulatory Visit: Payer: Self-pay | Admitting: Physician Assistant

## 2013-08-27 ENCOUNTER — Telehealth: Payer: Self-pay | Admitting: Family

## 2013-08-27 NOTE — Telephone Encounter (Signed)
Rx request to pharmacy/[facility]  

## 2013-08-31 NOTE — Telephone Encounter (Signed)
Opened in error

## 2013-09-15 ENCOUNTER — Institutional Professional Consult (permissible substitution): Payer: Self-pay | Admitting: Cardiology

## 2013-09-29 ENCOUNTER — Ambulatory Visit: Payer: Self-pay | Admitting: Physician Assistant

## 2013-09-29 ENCOUNTER — Telehealth: Payer: Self-pay | Admitting: Physician Assistant

## 2013-09-29 NOTE — Telephone Encounter (Signed)
Patient no-showed for appointment today.  Did not see where he rescheduled.  Patient with multiple serious medical issues.  Call him to reschedule within the next 2 weeks.
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
## 2013-10-14 ENCOUNTER — Other Ambulatory Visit: Payer: Self-pay | Admitting: Physician Assistant

## 2013-10-17 ENCOUNTER — Institutional Professional Consult (permissible substitution): Payer: Self-pay | Admitting: Cardiovascular Disease

## 2013-10-29 ENCOUNTER — Encounter: Payer: Self-pay | Admitting: Cardiovascular Disease

## 2013-10-30 ENCOUNTER — Encounter (HOSPITAL_BASED_OUTPATIENT_CLINIC_OR_DEPARTMENT_OTHER): Payer: Self-pay

## 2013-11-07 ENCOUNTER — Telehealth: Payer: Self-pay | Admitting: Physician Assistant

## 2013-11-07 ENCOUNTER — Emergency Department (HOSPITAL_BASED_OUTPATIENT_CLINIC_OR_DEPARTMENT_OTHER): Payer: Managed Care, Other (non HMO)

## 2013-11-07 ENCOUNTER — Emergency Department (HOSPITAL_BASED_OUTPATIENT_CLINIC_OR_DEPARTMENT_OTHER)
Admission: EM | Admit: 2013-11-07 | Discharge: 2013-11-07 | Disposition: A | Discharge status: Home / Self Care | Payer: Managed Care, Other (non HMO) | Attending: Emergency Medicine | Admitting: Emergency Medicine

## 2013-11-07 ENCOUNTER — Encounter (HOSPITAL_BASED_OUTPATIENT_CLINIC_OR_DEPARTMENT_OTHER): Payer: Self-pay | Admitting: Emergency Medicine

## 2013-11-07 DIAGNOSIS — R059 Cough, unspecified: Secondary | ICD-10-CM

## 2013-11-07 DIAGNOSIS — Z79899 Other long term (current) drug therapy: Secondary | ICD-10-CM | POA: Diagnosis not present

## 2013-11-07 DIAGNOSIS — N183 Chronic kidney disease, stage 3 (moderate): Secondary | ICD-10-CM | POA: Insufficient documentation

## 2013-11-07 DIAGNOSIS — E669 Obesity, unspecified: Secondary | ICD-10-CM | POA: Diagnosis not present

## 2013-11-07 DIAGNOSIS — N289 Disorder of kidney and ureter, unspecified: Secondary | ICD-10-CM | POA: Diagnosis not present

## 2013-11-07 DIAGNOSIS — R05 Cough: Secondary | ICD-10-CM

## 2013-11-07 DIAGNOSIS — I130 Hypertensive heart and chronic kidney disease with heart failure and stage 1 through stage 4 chronic kidney disease, or unspecified chronic kidney disease: Secondary | ICD-10-CM | POA: Insufficient documentation

## 2013-11-07 DIAGNOSIS — R319 Hematuria, unspecified: Secondary | ICD-10-CM | POA: Insufficient documentation

## 2013-11-07 DIAGNOSIS — I10 Essential (primary) hypertension: Secondary | ICD-10-CM

## 2013-11-07 DIAGNOSIS — J159 Unspecified bacterial pneumonia: Secondary | ICD-10-CM | POA: Diagnosis not present

## 2013-11-07 DIAGNOSIS — I509 Heart failure, unspecified: Secondary | ICD-10-CM | POA: Diagnosis not present

## 2013-11-07 DIAGNOSIS — M549 Dorsalgia, unspecified: Secondary | ICD-10-CM | POA: Diagnosis not present

## 2013-11-07 DIAGNOSIS — R0602 Shortness of breath: Secondary | ICD-10-CM

## 2013-11-07 DIAGNOSIS — J189 Pneumonia, unspecified organism: Secondary | ICD-10-CM

## 2013-11-07 LAB — BASIC METABOLIC PANEL
Anion gap: 12 (ref 5–15)
BUN: 28 mg/dL — ABNORMAL HIGH (ref 6–23)
CO2: 29 mEq/L (ref 19–32)
Calcium: 9.3 mg/dL (ref 8.4–10.5)
Chloride: 101 mEq/L (ref 96–112)
Creatinine, Ser: 3.2 mg/dL — ABNORMAL HIGH (ref 0.50–1.35)
GFR calc non Af Amer: 24 mL/min — ABNORMAL LOW (ref >90)
GFR, EST AFRICAN AMERICAN: 27 mL/min — AB (ref >90)
GLUCOSE: 103 mg/dL — AB (ref 70–99)
POTASSIUM: 3.6 meq/L — AB (ref 3.7–5.3)
SODIUM: 142 meq/L (ref 137–147)

## 2013-11-07 LAB — CBC WITH DIFFERENTIAL/PLATELET
Basophils Absolute: 0 10*3/uL (ref 0.0–0.1)
Basophils Relative: 0 % (ref 0–1)
EOS PCT: 2 % (ref 0–5)
Eosinophils Absolute: 0.2 10*3/uL (ref 0.0–0.7)
HCT: 38.4 % — ABNORMAL LOW (ref 39.0–52.0)
Hemoglobin: 12 g/dL — ABNORMAL LOW (ref 13.0–17.0)
Lymphocytes Relative: 18 % (ref 12–46)
Lymphs Abs: 2 10*3/uL (ref 0.7–4.0)
MCH: 21.5 pg — ABNORMAL LOW (ref 26.0–34.0)
MCHC: 31.3 g/dL (ref 30.0–36.0)
MCV: 68.8 fL — ABNORMAL LOW (ref 78.0–100.0)
MONOS PCT: 8 % (ref 3–12)
Monocytes Absolute: 0.9 10*3/uL (ref 0.1–1.0)
NEUTROS PCT: 72 % (ref 43–77)
Neutro Abs: 8.1 10*3/uL — ABNORMAL HIGH (ref 1.7–7.7)
PLATELETS: 350 10*3/uL (ref 150–400)
RBC: 5.58 MIL/uL (ref 4.22–5.81)
RDW: 19.9 % — ABNORMAL HIGH (ref 11.5–15.5)
WBC: 11.2 10*3/uL — AB (ref 4.0–10.5)

## 2013-11-07 LAB — PRO B NATRIURETIC PEPTIDE: PRO B NATRI PEPTIDE: 1619 pg/mL — AB (ref 0–125)

## 2013-11-07 MED ORDER — CEFTRIAXONE SODIUM 1 G IJ SOLR
INTRAMUSCULAR | Status: AC
  Administered 2013-11-07: 1000 mg
  Filled 2013-11-07: qty 10

## 2013-11-07 MED ORDER — DEXTROSE 5 % IV SOLN: 1.0000 g | Freq: Once | INTRAVENOUS | Status: AC

## 2013-11-07 MED ORDER — DM-GUAIFENESIN ER 30-600 MG PO TB12: 1.0000 | ORAL_TABLET | Freq: Two times a day (BID) | ORAL | Status: DC

## 2013-11-07 MED ORDER — AZITHROMYCIN 250 MG PO TABS: 250.0000 mg | ORAL_TABLET | Freq: Every day | ORAL | Status: DC

## 2013-11-07 MED ORDER — AZITHROMYCIN 250 MG PO TABS
500.0000 mg | ORAL_TABLET | Freq: Once | ORAL | Status: AC
  Administered 2013-11-07: 500 mg via ORAL
  Filled 2013-11-07: qty 2

## 2013-11-07 NOTE — ED Notes (Signed)
Reports cough over a month. Reports last visit with MD was over a month ago. Pt diminished.

## 2013-11-07 NOTE — Discharge Instructions (Signed)
Today consistent with a right-sided pneumonia. Take antibiotic as directed. Should be improving over a couple days if not return or followup with your regular Dr. Work note provided. Mucinex DM provided for cough.

## 2013-11-07 NOTE — ED Provider Notes (Signed)
CSN: [id number]     Arrival date & time 11/07/13  [id number] History   First MD Initiated Contact with Patient 11/07/13 [phone]     Chief Complaint  Patient presents with  . Cough     (Consider location/radiation/quality/duration/timing/severity/associated sxs/prior Treatment) Patient is a 33 y.o. male presenting with cough. The history is provided by the patient.  Cough Associated symptoms: chest pain and shortness of breath   Associated symptoms: no fever, no headaches and no rash    patient with a history of cough on and off for several months. No resolution. Worse shortness of breath in the past week. Not associated with upper respiratory type symptoms. No chest pain other than some to smile discomfort when coughing only. Suggestive of chest soreness. Patient is followed by primary care here at [facility] [place]. Patient has a known history of renal insufficiency and hypertension has been difficult to control. Patient has been taking his meds.  Past Medical History  Diagnosis Date  . CHF (congestive heart failure)   . 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 Chronic kidney disease, stage 3, mod decreased GFR   . Gout   . Hyperlipidemia   . Obesity   . Hx of migraine headaches    Past Surgical History  Procedure Laterality Date  . Cholecystectomy    . Retinal detachment repair w/ scleral buckle le      Left   Family History  Problem Relation Age of Onset  . Hypertension Mother   . Diabetes Neg Hx   . Heart attack Neg Hx   . Hyperlipidemia Mother   . Sudden death Neg Hx   . Heart disease Mother   . Hypertension Maternal Grandmother   . Stroke Maternal Grandfather   . Stroke Maternal Grandmother   . Hypertension Maternal Grandfather   . Heart disease Maternal Grandmother   . Heart disease Maternal Grandfather   . Hyperlipidemia Maternal Grandmother   . Hyperlipidemia Maternal Grandfather   . Cancer - Other Paternal  Grandmother   . Alzheimer's disease Maternal Grandfather   . Healthy Brother     x2  . Healthy Sister     x2  . Healthy Son     x2  . Healthy Daughter     x1   History  Substance Use Topics  . Smoking status: Never Smoker   . Smokeless tobacco: Never Used  . Alcohol Use: Yes     Comment: socially -- once every few months    Review of Systems  Constitutional: Negative for fever.  HENT: Negative for congestion.   Respiratory: Positive for cough and shortness of breath.   Cardiovascular: Positive for chest pain.       Chest discomfort with cough only.  Gastrointestinal: Negative for abdominal pain.  Genitourinary: Positive for hematuria.  Musculoskeletal: Positive for back pain.  Skin: Negative for rash.  Neurological: Negative for syncope and headaches.  Hematological: Does not bruise/bleed easily.  Psychiatric/Behavioral: Negative for confusion.      Allergies  Review of patient's allergies indicates no known allergies.  Home Medications   Prior to Admission medications   Medication Sig Start Date End Date Taking? Authorizing Provider  azithromycin (ZITHROMAX) 250 MG tablet Take 1 tablet (250 mg total) by mouth daily. Take first 2 tablets together, then 1 every day until finished. 11/07/13   Fredia Sorrow, MD  carvedilol (COREG) 25 MG tablet Take 1 tablet (25 mg total) by mouth  2 (two) times daily with a meal. 04/25/13   John L Molpus, MD  dextromethorphan-guaiFENesin (MUCINEX DM) 30-600 MG per 12 hr tablet Take 1 tablet by mouth 2 (two) times daily. 11/07/13   Fredia Sorrow, MD  EQ FIBER SUPPLEMENT PO Take by mouth daily.    Historical Provider, MD  furosemide (LASIX) 40 MG tablet TAKE 1 TABLET BY MOUTH TWICE DAILY 08/27/13   Brunetta Jeans, PA-C  hydrALAZINE (APRESOLINE) 100 MG tablet TAKE 1 TABLET BY MOUTH THREE TIMES DAILY 10/15/13   Brunetta Jeans, PA-C  losartan (COZAAR) 50 MG tablet Take 0.5 tablets (25 mg total) by mouth daily. 04/28/13   Brunetta Jeans,  PA-C  NON FORMULARY Take 2 capsules by mouth daily. Colon Clenz Herbal    Historical Provider, MD  spironolactone (ALDACTONE) 25 MG tablet Take 1 tablet (25 mg total) by mouth daily. 04/28/13   Brunetta Jeans, PA-C   BP 178/112  Pulse 96  Temp(Src) 99.3 F (37.4 C) (Oral)  Resp 18  Ht 5' 8.5" (1.74 m)  Wt 367 lb (166.47 kg)  BMI 54.98 kg/m2  SpO2 96% Physical Exam  Nursing note and vitals reviewed. Constitutional: He is oriented to person, place, and time. He appears well-developed and well-nourished. No distress.  HENT:  Head: Normocephalic and atraumatic.  Mouth/Throat: Oropharynx is clear and moist. No oropharyngeal exudate.  Eyes: Conjunctivae and EOM are normal. Pupils are equal, round, and reactive to light.  Neck: Normal range of motion. Neck supple.  Cardiovascular: Normal rate, regular rhythm and normal heart sounds.   No murmur heard. Pulmonary/Chest: Breath sounds normal. No respiratory distress. He has no wheezes. He has no rales.  Abdominal: Soft. Bowel sounds are normal. There is no tenderness.  Musculoskeletal: Normal range of motion.  Neurological: He is alert and oriented to person, place, and time. No cranial nerve deficit. He exhibits normal muscle tone. Coordination normal.  Skin: Skin is warm. Rash noted.    ED Course  Procedures (including critical care time) Labs Review Labs Reviewed  BASIC METABOLIC PANEL - Abnormal; Notable for the following:    Potassium 3.6 (*)    Glucose, Bld 103 (*)    BUN 28 (*)    Creatinine, Ser 3.20 (*)    GFR calc non Af Amer 24 (*)    GFR calc Af Amer 27 (*)    All other components within normal limits  CBC WITH DIFFERENTIAL - Abnormal; Notable for the following:    WBC 11.2 (*)    Hemoglobin 12.0 (*)    HCT 38.4 (*)    MCV 68.8 (*)    MCH 21.5 (*)    RDW 19.9 (*)    Neutro Abs 8.1 (*)    All other components within normal limits  PRO B NATRIURETIC PEPTIDE - Abnormal; Notable for the following:    Pro B  Natriuretic peptide (BNP) 1619.0 (*)    All other components within normal limits    Imaging Review Dg Chest 2 View  11/07/2013   CLINICAL DATA:  Cough.  EXAM: CHEST  2 VIEW  COMPARISON:  April 25, 2013.  FINDINGS: The heart size and mediastinal contours are within normal limits. No pneumothorax or pleural effusion is noted. Left lung is clear. Mild right basilar opacity is noted concerning for subsegmental atelectasis or possibly pneumonia. The visualized skeletal structures are unremarkable.  IMPRESSION: Mild right basilar opacity is noted concerning for pneumonia or subsegmental atelectasis. Followup radiographs are recommended.   Electronically Signed  By: Sabino [name] M.D.   On: 11/07/2013 08:54   Ct Chest Wo Contrast  11/07/2013   CLINICAL DATA:  Shortness of breath for 1 week  EXAM: CT CHEST WITHOUT CONTRAST  TECHNIQUE: Multidetector CT imaging of the chest was performed following the standard protocol without IV contrast.  COMPARISON:  Chest x-ray from earlier in the same day  FINDINGS: The lungs are well aerated bilaterally. Patchy infiltrate is noted in the right lower lobe. Some of this has a somewhat more nodular appearance best seen on image number 30 of series 3. No significant hilar or mediastinal adenopathy is noted. The cardiac shadow is mildly enlarged. The visualized upper abdomen is within normal limits. The bony structures appear within normal limits.  IMPRESSION: Patchy right lower lobe infiltrate. A portion of this has a somewhat nodular appearance. Followup following appropriate therapy is recommended. This can be performed by standard two view chest x-ray   Electronically Signed   By: Inez Catalina M.D.   On: 11/07/2013 09:57     EKG Interpretation None      MDM   Final diagnoses:  Cough  SOB (shortness of breath)  CAP (community acquired pneumonia)  Renal insufficiency  Essential hypertension    CT and chest x-ray consistent with a right lower lobe pneumonia.  Patient received 1 g Rocephin here IV and will be continued on Zithromax. Patient had for spell of Zithromax here in the ED. Patient's BNP is elevated but CT shows no evidence of CHF. Patient has significant hypertension and significant renal insufficiency. Patient is followed by primary care doctor upstairs and they are working on getting these things under control. Patient's oxygen saturations here are above 90%. Patient nontoxic no acute distress.    Fredia Sorrow, MD 11/07/13 1113

## 2013-11-07 NOTE — ED Notes (Signed)
MD at bedside. 

## 2013-11-07 NOTE — Telephone Encounter (Signed)
Per cody, ok to combine two 15 minute appointment slots for next week since he no longer has any 30 min appointments available or patient can be seen the next week.

## 2013-11-07 NOTE — Telephone Encounter (Signed)
Left message for patient to return my call   Call patient to schedule ER follow-up next week.  ----- Message ----- From: SYSTEM Sent: 11/07/2013 11:14 AM To: Brunetta Jeans, PA-C

## 2013-11-07 NOTE — ED Notes (Signed)
Patient transported to CT 

## 2013-12-22 ENCOUNTER — Other Ambulatory Visit: Payer: Self-pay | Admitting: Physician Assistant

## 2013-12-31 ENCOUNTER — Telehealth: Payer: Self-pay | Admitting: Physician Assistant

## 2013-12-31 DIAGNOSIS — I10 Essential (primary) hypertension: Secondary | ICD-10-CM

## 2013-12-31 DIAGNOSIS — I509 Heart failure, unspecified: Secondary | ICD-10-CM

## 2013-12-31 MED ORDER — SPIRONOLACTONE 25 MG PO TABS: 25.0000 mg | ORAL_TABLET | Freq: Every day | ORAL | Status: DC

## 2013-12-31 MED ORDER — LOSARTAN POTASSIUM 50 MG PO TABS: 25.0000 mg | ORAL_TABLET | Freq: Every day | ORAL | Status: DC

## 2013-12-31 MED ORDER — FUROSEMIDE 40 MG PO TABS: 40.0000 mg | ORAL_TABLET | Freq: Two times a day (BID) | ORAL | Status: DC

## 2013-12-31 MED ORDER — CARVEDILOL 25 MG PO TABS: 25.0000 mg | ORAL_TABLET | Freq: Two times a day (BID) | ORAL | Status: DC

## 2013-12-31 NOTE — Telephone Encounter (Signed)
Rx request to pharmacy; verified [facility]: [address] with patient/SLS

## 2013-12-31 NOTE — Telephone Encounter (Signed)
Caller name: leamon Relation to pt: self Call back number: [phone] Pharmacy: [facility] [address]  Reason for call:   Patient states that he is out of bp medication and would like to know if an emergency supply could be called in. Patient scheduled appointment for this friday

## 2014-01-02 ENCOUNTER — Encounter: Payer: Self-pay | Admitting: Physician Assistant

## 2014-01-02 ENCOUNTER — Ambulatory Visit (INDEPENDENT_AMBULATORY_CARE_PROVIDER_SITE_OTHER): Payer: Managed Care, Other (non HMO) | Admitting: Physician Assistant

## 2014-01-02 VITALS — BP 148/90 | HR 80 | Temp 98.0°F | Resp 16 | Wt 377.4 lb

## 2014-01-02 DIAGNOSIS — I509 Heart failure, unspecified: Secondary | ICD-10-CM

## 2014-01-02 DIAGNOSIS — I10 Essential (primary) hypertension: Secondary | ICD-10-CM

## 2014-01-02 MED ORDER — FUROSEMIDE 40 MG PO TABS: 40.0000 mg | ORAL_TABLET | Freq: Two times a day (BID) | ORAL | Status: DC

## 2014-01-02 MED ORDER — ALLOPURINOL 100 MG PO TABS: 100.0000 mg | ORAL_TABLET | Freq: Every day | ORAL | Status: DC

## 2014-01-02 MED ORDER — POTASSIUM CHLORIDE ER 20 MEQ PO TBCR: 20.0000 meq | EXTENDED_RELEASE_TABLET | Freq: Every day | ORAL | Status: DC

## 2014-01-02 MED ORDER — SPIRONOLACTONE 25 MG PO TABS: 25.0000 mg | ORAL_TABLET | Freq: Every day | ORAL | Status: DC

## 2014-01-02 MED ORDER — HYDRALAZINE HCL 100 MG PO TABS: 100.0000 mg | ORAL_TABLET | Freq: Three times a day (TID) | ORAL | Status: DC

## 2014-01-02 MED ORDER — CARVEDILOL 25 MG PO TABS: 25.0000 mg | ORAL_TABLET | Freq: Two times a day (BID) | ORAL | Status: DC

## 2014-01-02 MED ORDER — OLMESARTAN MEDOXOMIL 40 MG PO TABS: 40.0000 mg | ORAL_TABLET | Freq: Every day | ORAL | Status: DC

## 2014-01-02 NOTE — Progress Notes (Signed)
Pre visit review using our clinic review tool, if applicable. No additional management support is needed unless otherwise documented below in the visit note. 

## 2014-01-02 NOTE — Assessment & Plan Note (Signed)
Slightly above goal today.  Hydralazine refilled as patient has been out.  Other medications refilled as he will be out soon.  Resume current regimen.  Follow-up in 1 month.

## 2014-01-02 NOTE — Assessment & Plan Note (Signed)
>>  ASSESSMENT AND PLAN FOR BENIGN HYPERTENSION WITH CHRONIC KIDNEY DISEASE, STAGE IV (HCC) WRITTEN ON 01/02/2014  3:14 PM BY Jonn Nett C, PA-C  Slightly above goal today.  Hydralazine refilled as patient has been out.  Other medications refilled as he will be out soon.  Resume current regimen.  Follow-up in 1 month.

## 2014-01-02 NOTE — Progress Notes (Signed)
Patient presents to clinic today c/o for follow-up of hypertension and CKD Stage 3.  Patient endorses he has been seeing Nephrology. Has been followed closely with visits every 3-4 weeks.  Has been taking BP medications as directed.  Was told his kidney function is slowly improving.  Endorses he has also started a new diet and is engaging in water aerobics daily. Is losing weight.  Feels better than he has in a while.  BP at 148/90 in clinic but is out of hydralazine presently.  Patient wishes to switch back to benicar from losartan, now that he has new insurance.  Denies chest pain, palpitations, lightheadedness or dizziness.  Past Medical History  Diagnosis Date  . CHF (congestive heart failure)   . 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 Chronic kidney disease, stage 3, mod decreased GFR   . Gout   . Hyperlipidemia   . Obesity   . Hx of migraine headaches     Current Outpatient Prescriptions on File Prior to Visit  Medication Sig Dispense Refill  . EQ FIBER SUPPLEMENT PO Take by mouth daily.    . NON FORMULARY Take 2 capsules by mouth daily. Colon Clenz Herbal     No current facility-administered medications on file prior to visit.    No Known Allergies  Family History  Problem Relation Age of Onset  . Hypertension Mother   . Diabetes Neg Hx   . Heart attack Neg Hx   . Hyperlipidemia Mother   . Sudden death Neg Hx   . Heart disease Mother   . Hypertension Maternal Grandmother   . Stroke Maternal Grandfather   . Stroke Maternal Grandmother   . Hypertension Maternal Grandfather   . Heart disease Maternal Grandmother   . Heart disease Maternal Grandfather   . Hyperlipidemia Maternal Grandmother   . Hyperlipidemia Maternal Grandfather   . Cancer - Other Paternal Grandmother   . Alzheimer's disease Maternal Grandfather   . Healthy Brother     x2  . Healthy Sister     x2  . Healthy Son     x2  . Healthy Daughter     x1      History   Social History  . Marital Status: Married    Spouse Name: N/A    Number of Children: N/A  . Years of Education: N/A   Social History Main Topics  . Smoking status: Never Smoker   . Smokeless tobacco: Never Used  . Alcohol Use: Yes     Comment: socially -- once every few months  . Drug Use: No  . Sexual Activity: None   Other Topics Concern  . None   Social History Narrative   Review of Systems - See HPI.  All other ROS are negative.  BP 148/90 mmHg  Pulse 80  Temp(Src) 98 F (36.7 C) (Oral)  Resp 16  Wt 377 lb 6 oz (171.176 kg)  SpO2 95%  Physical Exam  Constitutional: He is oriented to person, place, and time and well-developed, well-nourished, and in no distress.  HENT:  Head: Normocephalic and atraumatic.  Right Ear: External ear normal.  Left Ear: External ear normal.  Nose: Nose normal.  Mouth/Throat: Oropharynx is clear and moist. No oropharyngeal exudate.  TM within normal limits bilaterally.  Eyes: Conjunctivae are normal. Pupils are equal, round, and reactive to light.  Neck: Neck supple.  Cardiovascular: Normal rate, regular rhythm, normal heart sounds and intact distal  pulses.   Pulmonary/Chest: Effort normal and breath sounds normal. No respiratory distress. He has no wheezes. He has no rales. He exhibits no tenderness.  Neurological: He is alert and oriented to person, place, and time.  Skin: Skin is warm and dry. No rash noted.  Psychiatric: Affect normal.  Vitals reviewed.   Recent Results (from the past 2160 hour(s))  Basic metabolic panel     Status: Abnormal   Collection Time: 11/07/13  9:24 AM  Result Value Ref Range   Sodium 142 137 - 147 mEq/L   Potassium 3.6 (L) 3.7 - 5.3 mEq/L   Chloride 101 96 - 112 mEq/L   CO2 29 19 - 32 mEq/L   Glucose, Bld 103 (H) 70 - 99 mg/dL   BUN 28 (H) 6 - 23 mg/dL   Creatinine, Ser 3.20 (H) 0.50 - 1.35 mg/dL   Calcium 9.3 8.4 - 10.5 mg/dL   GFR calc non Af Amer 24 (L) >90 mL/min   GFR calc  Af Amer 27 (L) >90 mL/min    Comment: (NOTE) The eGFR has been calculated using the CKD EPI equation. This calculation has not been validated in all clinical situations. eGFR's persistently <90 mL/min signify possible Chronic Kidney Disease.   Anion gap 12 5 - 15  CBC with Differential     Status: Abnormal   Collection Time: 11/07/13  9:24 AM  Result Value Ref Range   WBC 11.2 (H) 4.0 - 10.5 K/uL   RBC 5.58 4.22 - 5.81 MIL/uL   Hemoglobin 12.0 (L) 13.0 - 17.0 g/dL   HCT 38.4 (L) 39.0 - 52.0 %   MCV 68.8 (L) 78.0 - 100.0 fL   MCH 21.5 (L) 26.0 - 34.0 pg   MCHC 31.3 30.0 - 36.0 g/dL   RDW 19.9 (H) 11.5 - 15.5 %   Platelets 350 150 - 400 K/uL   Neutrophils Relative % 72 43 - 77 %   Lymphocytes Relative 18 12 - 46 %   Monocytes Relative 8 3 - 12 %   Eosinophils Relative 2 0 - 5 %   Basophils Relative 0 0 - 1 %   Neutro Abs 8.1 (H) 1.7 - 7.7 K/uL   Lymphs Abs 2.0 0.7 - 4.0 K/uL   Monocytes Absolute 0.9 0.1 - 1.0 K/uL   Eosinophils Absolute 0.2 0.0 - 0.7 K/uL   Basophils Absolute 0.0 0.0 - 0.1 K/uL   RBC Morphology ELLIPTOCYTES   Pro b natriuretic peptide (BNP)     Status: Abnormal   Collection Time: 11/07/13  9:24 AM  Result Value Ref Range   Pro B Natriuretic peptide (BNP) 1619.0 (H) 0 - 125 pg/mL    Assessment/Plan: CHF (congestive heart failure) Increased exercise.  Asymptomatic. No worrisome findings on exam. Medications refilled. Continue current regimen.  Essential hypertension, benign Slightly above goal today.  Hydralazine refilled as patient has been out.  Other medications refilled as he will be out soon.  Resume current regimen.  Follow-up in 1 month.

## 2014-01-02 NOTE — Patient Instructions (Signed)
Please continue medications as directed.  Continue diet and exercise.  I will be requesting records from Dr. Deterding's office so I can review your files and lab results.  We can decide on weight-loss regimen once those results are in.  Continue the good work.  Follow-up will be based on what I find in records.

## 2014-01-02 NOTE — Assessment & Plan Note (Signed)
Increased exercise.  Asymptomatic. No worrisome findings on exam. Medications refilled. Continue current regimen.

## 2014-01-13 ENCOUNTER — Ambulatory Visit (HOSPITAL_BASED_OUTPATIENT_CLINIC_OR_DEPARTMENT_OTHER): Payer: Managed Care, Other (non HMO) | Attending: Family

## 2014-02-19 ENCOUNTER — Telehealth: Payer: Self-pay | Admitting: Physician Assistant

## 2014-02-19 DIAGNOSIS — E669 Obesity, unspecified: Secondary | ICD-10-CM

## 2014-02-19 NOTE — Telephone Encounter (Signed)
Caller name: Romyn, Abeyta Relation to pt: self  Call back number: [phone]   Reason for call:  Pt states he is extremely frustrated he has not received a follow up call regarding he's diet plan or medication regarding weight loss. Pt states he saw kidney specialist as per PA advice. In need of clinical advice

## 2014-02-19 NOTE — Telephone Encounter (Signed)
Reviewed chart to see if we ever received records from Dr. Jimmy Footman -- finally received something a little bit ago.  Was sent to records by mistake without calling patient. Most of the prescription medications his insurance will cover have to much of an affect on the heart.  The newest ones have less of an effect on the heart but are contraindicated in patient's with severe renal disease, which he has. I would recommend he let me set him up with new nutrition referral or bariatric specialist for weight loss medication and regimen.

## 2014-02-19 NOTE — Telephone Encounter (Signed)
Please advise 

## 2014-02-20 NOTE — Telephone Encounter (Signed)
Pt is agreeable to both a nutrition referral and bariatric specialist.  Please advise.

## 2014-02-20 NOTE — Telephone Encounter (Signed)
Placed.  He should be getting some phone calls for appointments.

## 2014-02-26 ENCOUNTER — Ambulatory Visit: Payer: Self-pay | Admitting: Dietician

## 2014-02-26 NOTE — Telephone Encounter (Signed)
I see where patient did not show up for his appointment with the nutritionist.  I would strongly encourage him to reschedule, as the nutritionist will help him tremendously with his nutritional health and weight loss goals.
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
## 2014-04-02 ENCOUNTER — Other Ambulatory Visit: Payer: Self-pay | Admitting: Physician Assistant

## 2014-05-26 ENCOUNTER — Encounter: Payer: Self-pay | Admitting: Family Medicine

## 2014-06-17 ENCOUNTER — Other Ambulatory Visit: Payer: Self-pay | Admitting: Physician Assistant

## 2014-06-28 ENCOUNTER — Other Ambulatory Visit: Payer: Self-pay | Admitting: Physician Assistant

## 2014-07-13 ENCOUNTER — Emergency Department (HOSPITAL_BASED_OUTPATIENT_CLINIC_OR_DEPARTMENT_OTHER)
Admission: EM | Admit: 2014-07-13 | Discharge: 2014-07-13 | Disposition: A | Discharge status: Other Acute Inpatient Hospital | Payer: Managed Care, Other (non HMO) | Attending: Emergency Medicine | Admitting: Emergency Medicine

## 2014-07-13 ENCOUNTER — Emergency Department (HOSPITAL_BASED_OUTPATIENT_CLINIC_OR_DEPARTMENT_OTHER): Payer: Managed Care, Other (non HMO)

## 2014-07-13 ENCOUNTER — Encounter (HOSPITAL_BASED_OUTPATIENT_CLINIC_OR_DEPARTMENT_OTHER): Payer: Self-pay | Admitting: *Deleted

## 2014-07-13 DIAGNOSIS — N183 Chronic kidney disease, stage 3 (moderate): Secondary | ICD-10-CM | POA: Insufficient documentation

## 2014-07-13 DIAGNOSIS — R7989 Other specified abnormal findings of blood chemistry: Secondary | ICD-10-CM | POA: Diagnosis not present

## 2014-07-13 DIAGNOSIS — Z79899 Other long term (current) drug therapy: Secondary | ICD-10-CM | POA: Insufficient documentation

## 2014-07-13 DIAGNOSIS — Z8639 Personal history of other endocrine, nutritional and metabolic disease: Secondary | ICD-10-CM | POA: Diagnosis not present

## 2014-07-13 DIAGNOSIS — M109 Gout, unspecified: Secondary | ICD-10-CM | POA: Diagnosis not present

## 2014-07-13 DIAGNOSIS — I130 Hypertensive heart and chronic kidney disease with heart failure and stage 1 through stage 4 chronic kidney disease, or unspecified chronic kidney disease: Secondary | ICD-10-CM | POA: Insufficient documentation

## 2014-07-13 DIAGNOSIS — R0602 Shortness of breath: Secondary | ICD-10-CM | POA: Diagnosis present

## 2014-07-13 DIAGNOSIS — R778 Other specified abnormalities of plasma proteins: Secondary | ICD-10-CM

## 2014-07-13 DIAGNOSIS — I509 Heart failure, unspecified: Secondary | ICD-10-CM

## 2014-07-13 DIAGNOSIS — R000 Tachycardia, unspecified: Secondary | ICD-10-CM | POA: Diagnosis not present

## 2014-07-13 LAB — CBC WITH DIFFERENTIAL/PLATELET
BASOS PCT: 0 % (ref 0–1)
Basophils Absolute: 0 10*3/uL (ref 0.0–0.1)
EOS PCT: 2 % (ref 0–5)
Eosinophils Absolute: 0.2 10*3/uL (ref 0.0–0.7)
HEMATOCRIT: 34.2 % — AB (ref 39.0–52.0)
HEMOGLOBIN: 10.6 g/dL — AB (ref 13.0–17.0)
LYMPHS ABS: 1.5 10*3/uL (ref 0.7–4.0)
Lymphocytes Relative: 15 % (ref 12–46)
MCH: 20.6 pg — AB (ref 26.0–34.0)
MCHC: 31 g/dL (ref 30.0–36.0)
MCV: 66.4 fL — AB (ref 78.0–100.0)
MONOS PCT: 11 % (ref 3–12)
Monocytes Absolute: 1.1 10*3/uL — ABNORMAL HIGH (ref 0.1–1.0)
Neutro Abs: 7.2 10*3/uL (ref 1.7–7.7)
Neutrophils Relative %: 72 % (ref 43–77)
Platelets: 346 10*3/uL (ref 150–400)
RBC: 5.15 MIL/uL (ref 4.22–5.81)
RDW: 20.5 % — ABNORMAL HIGH (ref 11.5–15.5)
WBC: 10 10*3/uL (ref 4.0–10.5)

## 2014-07-13 LAB — COMPREHENSIVE METABOLIC PANEL
ALBUMIN: 3.4 g/dL — AB (ref 3.5–5.0)
ALT: 22 U/L (ref 17–63)
ANION GAP: 10 (ref 5–15)
AST: 26 U/L (ref 15–41)
Alkaline Phosphatase: 75 U/L (ref 38–126)
BUN: 27 mg/dL — ABNORMAL HIGH (ref 6–20)
CALCIUM: 8.8 mg/dL — AB (ref 8.9–10.3)
CO2: 29 mmol/L (ref 22–32)
Chloride: 101 mmol/L (ref 101–111)
Creatinine, Ser: 3.09 mg/dL — ABNORMAL HIGH (ref 0.61–1.24)
GFR calc non Af Amer: 24 mL/min — ABNORMAL LOW (ref >60)
GFR, EST AFRICAN AMERICAN: 28 mL/min — AB (ref >60)
Glucose, Bld: 116 mg/dL — ABNORMAL HIGH (ref 65–99)
Potassium: 3 mmol/L — ABNORMAL LOW (ref 3.5–5.1)
Sodium: 140 mmol/L (ref 135–145)
TOTAL PROTEIN: 6.9 g/dL (ref 6.5–8.1)
Total Bilirubin: 0.7 mg/dL (ref 0.3–1.2)

## 2014-07-13 LAB — I-STAT CG4 LACTIC ACID, ED: Lactic Acid, Venous: 0.96 mmol/L (ref 0.5–2.0)

## 2014-07-13 LAB — BRAIN NATRIURETIC PEPTIDE: B Natriuretic Peptide: 308.6 pg/mL — ABNORMAL HIGH (ref 0.0–100.0)

## 2014-07-13 LAB — TROPONIN I: Troponin I: 0.17 ng/mL — ABNORMAL HIGH (ref <0.031)

## 2014-07-13 LAB — LIPASE, BLOOD: Lipase: 21 U/L — ABNORMAL LOW (ref 22–51)

## 2014-07-13 MED ORDER — FUROSEMIDE 10 MG/ML IJ SOLN
80.0000 mg | Freq: Once | INTRAMUSCULAR | Status: AC
  Administered 2014-07-13: 80 mg via INTRAVENOUS
  Filled 2014-07-13: qty 8

## 2014-07-13 MED ORDER — ASPIRIN 81 MG PO CHEW
324.0000 mg | CHEWABLE_TABLET | Freq: Once | ORAL | Status: AC
  Administered 2014-07-13: 324 mg via ORAL
  Filled 2014-07-13: qty 4

## 2014-07-13 MED ORDER — NITROGLYCERIN IN D5W 200-5 MCG/ML-% IV SOLN
10.0000 ug/min | INTRAVENOUS | Status: DC
  Administered 2014-07-13: 10 ug/min via INTRAVENOUS
  Filled 2014-07-13: qty 250

## 2014-07-13 NOTE — ED Notes (Signed)
[facility] here, pt dozing off while sitting up, sleep apnea noted, VSS, ST on monitor, BP improving, RR elevated SPO2 2L 97%, remains interactive, calm, NAD.

## 2014-07-13 NOTE — ED Notes (Signed)
No changes, out with [facility].

## 2014-07-13 NOTE — ED Notes (Signed)
C/o sob, upper abd discomfort with ambulating, skin W&D, significant pedal edema, orthopnea. extra water pill did not help. Onset Saturday. "just got back from a cruise".

## 2014-07-13 NOTE — ED Provider Notes (Signed)
CSN: [id number]     Arrival date & time 07/13/14  0159 History   First MD Initiated Contact with Patient 07/13/14 0232     Chief Complaint  Patient presents with  . Shortness of Breath     (Consider location/radiation/quality/duration/timing/severity/associated sxs/prior Treatment) HPI  This is a 34 year old male with a history of morbid obesity and congestive heart failure. He recently went on a cruise, returning yesterday. He has been having worsening shortness of breath for the past 4 days. Symptoms were worse with exertion or lying flat. When he lies flat it is severe enough that he feels like he is drowning. He has also had a cough that occurs when he lies flat. He denies chest pain. He has been having increased edema of his lower extremities. He has taken extra Lasix without relief and has not experienced significant diuresis despite the extra Lasix.  Past Medical History  Diagnosis Date  . CHF (congestive heart failure)   . 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 Chronic kidney disease, stage 3, mod decreased GFR   . Gout   . Hyperlipidemia   . Obesity   . Hx of migraine headaches    Past Surgical History  Procedure Laterality Date  . Cholecystectomy    . Retinal detachment repair w/ scleral buckle le      Left   Family History  Problem Relation Age of Onset  . Hypertension Mother   . Diabetes Neg Hx   . Heart attack Neg Hx   . Hyperlipidemia Mother   . Sudden death Neg Hx   . Heart disease Mother   . Hypertension Maternal Grandmother   . Stroke Maternal Grandfather   . Stroke Maternal Grandmother   . Hypertension Maternal Grandfather   . Heart disease Maternal Grandmother   . Heart disease Maternal Grandfather   . Hyperlipidemia Maternal Grandmother   . Hyperlipidemia Maternal Grandfather   . Cancer - Other Paternal Grandmother   . Alzheimer's disease Maternal Grandfather   . Healthy Brother     x2  . Healthy  Sister     x2  . Healthy Son     x2  . Healthy Daughter     x1   History  Substance Use Topics  . Smoking status: Never Smoker   . Smokeless tobacco: Never Used  . Alcohol Use: Yes     Comment: socially -- once every few months    Review of Systems  All other systems reviewed and are negative.   Allergies  Review of patient's allergies indicates no known allergies.  Home Medications   Prior to Admission medications   Medication Sig Start Date End Date Taking? Authorizing Provider  allopurinol (ZYLOPRIM) 100 MG tablet Take 1 tablet (100 mg total) by mouth daily. 01/02/14  Yes Brunetta Jeans, PA-C  carvedilol (COREG) 25 MG tablet Take 1 tablet (25 mg total) by mouth 2 (two) times daily with a meal. 01/02/14  Yes Brunetta Jeans, PA-C  furosemide (LASIX) 40 MG tablet TAKE 1 TABLET BY MOUTH TWICE DAILY 06/28/14  Yes Brunetta Jeans, PA-C  hydrALAZINE (APRESOLINE) 100 MG tablet TAKE 1 TABLET BY MOUTH THREE TIMES DAILY 06/18/14  Yes Brunetta Jeans, PA-C  olmesartan (BENICAR) 40 MG tablet Take 1 tablet (40 mg total) by mouth daily. 01/02/14  Yes Brunetta Jeans, PA-C  Potassium Chloride ER 20 MEQ TBCR Take 20 mEq by mouth daily. 01/02/14  Yes Luanna Cole  Hassell Done, PA-C  spironolactone (ALDACTONE) 25 MG tablet Take 1 tablet (25 mg total) by mouth daily. 01/02/14  Yes Brunetta Jeans, PA-C  calcitRIOL (ROCALTROL) 0.25 MCG capsule  11/28/13   Historical Provider, MD  EQ FIBER SUPPLEMENT PO Take by mouth daily.    Historical Provider, MD  NON FORMULARY Take 2 capsules by mouth daily. Colon Clenz Herbal    Historical Provider, MD   BP 143/106 mmHg  Pulse 106  Temp(Src) 98.3 F (36.8 C) (Oral)  Resp 18  Ht 5' 8.5" (1.74 m)  Wt 374 lb (169.645 kg)  BMI 56.03 kg/m2  SpO2 97%   Physical Exam  General: Well-developed, morbidly obese male in no acute distress; appearance consistent with age of record HENT: normocephalic; atraumatic Eyes: pupils equal, round and reactive to light;  extraocular muscles intact; left lens implant Neck: supple Heart: regular rate and rhythm; distant sounds; tachycardia Lungs: decreased air movement in the bases Abdomen: soft; obese; mild epigastric tenderness; bowel sounds present Extremities: No deformity; full range of motion; pulses normal;2+ pitting edema of lower legs Neurologic: Awake, alert and oriented; motor function intact in all extremities and symmetric; no facial droop Skin: Warm and dry Psychiatric: Normal mood and affect    ED Course  Procedures (including critical care time)   MDM   Nursing notes and vitals signs, including pulse oximetry, reviewed.  Summary of this visit's results, reviewed by myself:  Labs:  Results for orders placed or performed during the hospital encounter of 07/13/14 (from the past 24 hour(s))  CBC with Differential     Status: Abnormal   Collection Time: 07/13/14  2:25 AM  Result Value Ref Range   WBC 10.0 4.0 - 10.5 K/uL   RBC 5.15 4.22 - 5.81 MIL/uL   Hemoglobin 10.6 (L) 13.0 - 17.0 g/dL   HCT 34.2 (L) 39.0 - 52.0 %   MCV 66.4 (L) 78.0 - 100.0 fL   MCH 20.6 (L) 26.0 - 34.0 pg   MCHC 31.0 30.0 - 36.0 g/dL   RDW 20.5 (H) 11.5 - 15.5 %   Platelets 346 150 - 400 K/uL   Neutrophils Relative % 72 43 - 77 %   Lymphocytes Relative 15 12 - 46 %   Monocytes Relative 11 3 - 12 %   Eosinophils Relative 2 0 - 5 %   Basophils Relative 0 0 - 1 %   Neutro Abs 7.2 1.7 - 7.7 K/uL   Lymphs Abs 1.5 0.7 - 4.0 K/uL   Monocytes Absolute 1.1 (H) 0.1 - 1.0 K/uL   Eosinophils Absolute 0.2 0.0 - 0.7 K/uL   Basophils Absolute 0.0 0.0 - 0.1 K/uL   RBC Morphology POLYCHROMASIA PRESENT   Brain natriuretic peptide     Status: Abnormal   Collection Time: 07/13/14  2:25 AM  Result Value Ref Range   B Natriuretic Peptide 308.6 (H) 0.0 - 100.0 pg/mL  Troponin I     Status: Abnormal   Collection Time: 07/13/14  2:25 AM  Result Value Ref Range   Troponin I 0.17 (H) <0.031 ng/mL  Comprehensive metabolic  panel     Status: Abnormal   Collection Time: 07/13/14  2:25 AM  Result Value Ref Range   Sodium 140 135 - 145 mmol/L   Potassium 3.0 (L) 3.5 - 5.1 mmol/L   Chloride 101 101 - 111 mmol/L   CO2 29 22 - 32 mmol/L   Glucose, Bld 116 (H) 65 - 99 mg/dL   BUN 27 (H) 6 -  20 mg/dL   Creatinine, Ser 3.09 (H) 0.61 - 1.24 mg/dL   Calcium 8.8 (L) 8.9 - 10.3 mg/dL   Total Protein 6.9 6.5 - 8.1 g/dL   Albumin 3.4 (L) 3.5 - 5.0 g/dL   AST 26 15 - 41 U/L   ALT 22 17 - 63 U/L   Alkaline Phosphatase 75 38 - 126 U/L   Total Bilirubin 0.7 0.3 - 1.2 mg/dL   GFR calc non Af Amer 24 (L) >60 mL/min   GFR calc Af Amer 28 (L) >60 mL/min   Anion gap 10 5 - 15  Lipase, blood     Status: Abnormal   Collection Time: 07/13/14  2:25 AM  Result Value Ref Range   Lipase 21 (L) 22 - 51 U/L  I-Stat CG4 Lactic Acid, ED     Status: None   Collection Time: 07/13/14  2:32 AM  Result Value Ref Range   Lactic Acid, Venous 0.96 0.5 - 2.0 mmol/L    Imaging Studies: Dg Chest 2 View  07/13/2014   CLINICAL DATA:  Shortness of breath. Pedal edema and upper abdominal discomfort with ambulating. Orthopnea. Onset 2 days prior.  EXAM: CHEST  2 VIEW  COMPARISON:  Radiographs and CT 11/07/2013  FINDINGS: The heart is enlarged, progressed from prior exam. Mild vascular congestion and peribronchial cuffing. Persistent opacity at the right lung base. No pleural effusion or pneumothorax. No acute osseous abnormalities are seen.  IMPRESSION: 1. Persistent right basilar opacity, this may reflect recurrent pneumonia, however continued radiographic follow-up is recommended after course of treatment and 3-4 weeks. 2. Progressive cardiomegaly. Vascular congestion and peribronchial cuffing, concerning for pulmonary edema.   Electronically Signed   By: Jeb Levering M.D.   On: 07/13/2014 02:53   3:36 AM Patient given Lasix IV and placed on nitroglycerin drip with diuresis. Troponin is elevated. We will have him admitted to [facility], accepted by Dr. Minna Merritts of cardiology. Also stress with Dr. Caryn Section the [facility].  Shanon Rosser, MD 07/13/14 [phone]

## 2014-07-13 NOTE — ED Notes (Signed)
Pt standing at [facility] for urinal (2nd void), tolerating well, increased RR 40. titrating ntg gtt for BP. Alert, NAD, calm, interactive, denies pain, pending arrival of [facility] transport to [facility] ED.

## 2014-07-21 ENCOUNTER — Other Ambulatory Visit: Payer: Self-pay | Admitting: Physician Assistant

## 2014-07-21 ENCOUNTER — Telehealth: Payer: Self-pay | Admitting: Physician Assistant

## 2014-07-21 NOTE — Telephone Encounter (Signed)
Called and spoke with the pt and informed him of the note below.  Pt verbalized understanding and agreed.  Pt was scheduled a ER follow-up on Wed 07-21-14 @ 10:45am.//AB/CMA

## 2014-07-21 NOTE — Telephone Encounter (Signed)
Patient had requested another refill of Lasix which has been sent to the pharmacy. This is the second time I have had to note to the pharmacy to have him schedule an appointment. Will you call him to relay he will need appointment before further refills will be given. He was in the ER last week for an exacerbation of his uncontrolled heart failure. He needs to be seen within the next week if possible.

## 2014-07-22 ENCOUNTER — Encounter: Payer: Self-pay | Admitting: Physician Assistant

## 2014-07-22 ENCOUNTER — Ambulatory Visit (INDEPENDENT_AMBULATORY_CARE_PROVIDER_SITE_OTHER): Payer: Managed Care, Other (non HMO) | Admitting: Physician Assistant

## 2014-07-22 VITALS — BP 114/72 | HR 95 | Temp 98.8°F | Ht 68.0 in | Wt 392.2 lb

## 2014-07-22 DIAGNOSIS — I509 Heart failure, unspecified: Secondary | ICD-10-CM | POA: Diagnosis not present

## 2014-07-22 DIAGNOSIS — R609 Edema, unspecified: Secondary | ICD-10-CM | POA: Diagnosis not present

## 2014-07-22 MED ORDER — FUROSEMIDE 10 MG/ML IJ SOLN: 40.0000 mg | Freq: Once | INTRAMUSCULAR | Status: DC

## 2014-07-22 MED ORDER — TORSEMIDE 20 MG PO TABS: 40.0000 mg | ORAL_TABLET | Freq: Every day | ORAL | Status: DC

## 2014-07-22 MED ORDER — LINACLOTIDE 145 MCG PO CAPS: 145.0000 ug | ORAL_CAPSULE | Freq: Every day | ORAL | Status: DC

## 2014-07-22 MED ORDER — FUROSEMIDE 10 MG/ML IJ SOLN
20.0000 mg | Freq: Once | INTRAMUSCULAR | Status: AC
  Administered 2014-07-22: 20 mg via INTRAMUSCULAR

## 2014-07-22 NOTE — Progress Notes (Signed)
Pre visit review using our clinic review tool, if applicable. No additional management support is needed unless otherwise documented below in the visit note. 

## 2014-07-22 NOTE — Patient Instructions (Signed)
Please take tonight's Lasix dose. Start the [employer] tomorrow taking as directed. Keep hydrated. Continue all other medications as directed. Follow-up with Dr. Wyline Copas (Cardiology) tomorrow as directed for heart failure. If anything worsens please go to the ER.  I will call you once I have coupons for Linzess for constipation.

## 2014-07-27 DIAGNOSIS — I509 Heart failure, unspecified: Secondary | ICD-10-CM | POA: Insufficient documentation

## 2014-07-27 NOTE — Progress Notes (Signed)
Patient with past medical history significant for hypertension, stage III chronic kidney disease and congestive heart failure presents to clinic today for hospitalization follow-up. Patient presented to emergency department on 07/13/2014 with complaints of worsening shortness of breath and swelling of extremities despite taking chronic medications as directed. Patient had just returned from a cruise. Endorses very poor diet to ER physician. Patient assessed in ER and given IV Lasix and nitroglycerin drip. Patient with elevated troponin, therefore subsequently admitted to [facility] for further assessment. Patient was continued on IV Lasix with significant diuresis during hospital stay. Repeat troponin negative. Cardiology consulted and elevated troponin  Thought to be related to volume overload. Patient placed on sodium restricted diet. Patient discharged with primary care follow-up as well as follow-up with cardiology Wyline Copas). Since hospital discharge, patient states he was doing well overall. Has noted significant weight gain over the past couple of days despite watching his sodium intake and taking his Lasix as directed. Patient denies chest pain or shortness of breath. States he otherwise feels fine. Patient has upcoming follow-up with cardiology tomorrow.  Past Medical History  Diagnosis Date  . CHF (congestive heart failure)   . 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 Chronic kidney disease, stage 3, mod decreased GFR   . Gout   . Hyperlipidemia   . Obesity   . Hx of migraine headaches     Current Outpatient Prescriptions on File Prior to Visit  Medication Sig Dispense Refill  . allopurinol (ZYLOPRIM) 100 MG tablet Take 1 tablet (100 mg total) by mouth daily. 30 tablet 3  . calcitRIOL (ROCALTROL) 0.25 MCG capsule     . carvedilol (COREG) 25 MG tablet Take 1 tablet (25 mg total) by mouth 2 (two) times daily with a meal. 180  tablet 1  . EQ FIBER SUPPLEMENT PO Take by mouth daily.    . hydrALAZINE (APRESOLINE) 100 MG tablet TAKE 1 TABLET BY MOUTH THREE TIMES DAILY 90 tablet 0  . NON FORMULARY Take 2 capsules by mouth daily. Colon Clenz Herbal    . olmesartan (BENICAR) 40 MG tablet Take 1 tablet (40 mg total) by mouth daily. 90 tablet 1  . Potassium Chloride ER 20 MEQ TBCR Take 20 mEq by mouth daily. 60 tablet 2  . spironolactone (ALDACTONE) 25 MG tablet Take 1 tablet (25 mg total) by mouth daily. 90 tablet 1   No current facility-administered medications on file prior to visit.    No Known Allergies  Family History  Problem Relation Age of Onset  . Hypertension Mother   . Diabetes Neg Hx   . Heart attack Neg Hx   . Hyperlipidemia Mother   . Sudden death Neg Hx   . Heart disease Mother   . Hypertension Maternal Grandmother   . Stroke Maternal Grandfather   . Stroke Maternal Grandmother   . Hypertension Maternal Grandfather   . Heart disease Maternal Grandmother   . Heart disease Maternal Grandfather   . Hyperlipidemia Maternal Grandmother   . Hyperlipidemia Maternal Grandfather   . Cancer - Other Paternal Grandmother   . Alzheimer's disease Maternal Grandfather   . Healthy Brother     x2  . Healthy Sister     x2  . Healthy Son     x2  . Healthy Daughter     x1    History   Social History  . Marital Status: Married    Spouse Name: N/A  .  Number of Children: N/A  . Years of Education: N/A   Social History Main Topics  . Smoking status: Never Smoker   . Smokeless tobacco: Never Used  . Alcohol Use: Yes     Comment: socially -- once every few months  . Drug Use: No  . Sexual Activity: Not on file   Other Topics Concern  . None   Social History Narrative   Review of Systems - See HPI.  All other ROS are negative.  BP 114/72 mmHg  Pulse 95  Temp(Src) 98.8 F (37.1 C) (Oral)  Ht _0  (1.727 m)  Wt 392 lb 3.2 oz (177.901 kg)  BMI 59.65 kg/m2  SpO2 94%  Physical Exam    Constitutional: He is oriented to person, place, and time and well-developed, well-nourished, and in no distress.  HENT:  Head: Normocephalic and atraumatic.  Eyes: Conjunctivae are normal.  Neck: Neck supple.  Cardiovascular: Normal rate, regular rhythm, normal heart sounds and intact distal pulses.   Pulses:      Dorsalis pedis pulses are 2+ on the right side, and 2+ on the left side.       Posterior tibial pulses are 2+ on the right side, and 2+ on the left side.  1+ pitting edema bilateral lower extremities noted on examination.  Pulmonary/Chest: Effort normal and breath sounds normal. No respiratory distress. He has no wheezes. He has no rales. He exhibits no tenderness.  Neurological: He is alert and oriented to person, place, and time.  Skin: Skin is warm and dry. No rash noted.  Psychiatric: Affect normal.  Vitals reviewed.   Recent Results (from the past 2160 hour(s))  CBC with Differential     Status: Abnormal   Collection Time: 07/13/14  2:25 AM  Result Value Ref Range   WBC 10.0 4.0 - 10.5 K/uL   RBC 5.15 4.22 - 5.81 MIL/uL   Hemoglobin 10.6 (L) 13.0 - 17.0 g/dL   HCT 34.2 (L) 39.0 - 52.0 %   MCV 66.4 (L) 78.0 - 100.0 fL   MCH 20.6 (L) 26.0 - 34.0 pg   MCHC 31.0 30.0 - 36.0 g/dL   RDW 20.5 (H) 11.5 - 15.5 %   Platelets 346 150 - 400 K/uL   Neutrophils Relative % 72 43 - 77 %   Lymphocytes Relative 15 12 - 46 %   Monocytes Relative 11 3 - 12 %   Eosinophils Relative 2 0 - 5 %   Basophils Relative 0 0 - 1 %   Neutro Abs 7.2 1.7 - 7.7 K/uL   Lymphs Abs 1.5 0.7 - 4.0 K/uL   Monocytes Absolute 1.1 (H) 0.1 - 1.0 K/uL   Eosinophils Absolute 0.2 0.0 - 0.7 K/uL   Basophils Absolute 0.0 0.0 - 0.1 K/uL   RBC Morphology POLYCHROMASIA PRESENT     Comment: TARGET CELLS TEARDROP CELLS ELLIPTOCYTES   Brain natriuretic peptide     Status: Abnormal   Collection Time: 07/13/14  2:25 AM  Result Value Ref Range   B Natriuretic Peptide 308.6 (H) 0.0 - 100.0 pg/mL  Troponin I      Status: Abnormal   Collection Time: 07/13/14  2:25 AM  Result Value Ref Range   Troponin I 0.17 (H) <0.031 ng/mL    Comment:        PERSISTENTLY INCREASED TROPONIN VALUES IN THE RANGE OF 0.04-0.49 ng/mL CAN BE SEEN IN:       -UNSTABLE ANGINA       -CONGESTIVE HEART  FAILURE       -MYOCARDITIS       -CHEST TRAUMA       -ARRYHTHMIAS       -LATE PRESENTING MYOCARDIAL INFARCTION       -COPD   CLINICAL FOLLOW-UP RECOMMENDED.   Comprehensive metabolic panel     Status: Abnormal   Collection Time: 07/13/14  2:25 AM  Result Value Ref Range   Sodium 140 135 - 145 mmol/L   Potassium 3.0 (L) 3.5 - 5.1 mmol/L   Chloride 101 101 - 111 mmol/L   CO2 29 22 - 32 mmol/L   Glucose, Bld 116 (H) 65 - 99 mg/dL   BUN 27 (H) 6 - 20 mg/dL   Creatinine, Ser 3.09 (H) 0.61 - 1.24 mg/dL   Calcium 8.8 (L) 8.9 - 10.3 mg/dL   Total Protein 6.9 6.5 - 8.1 g/dL   Albumin 3.4 (L) 3.5 - 5.0 g/dL   AST 26 15 - 41 U/L   ALT 22 17 - 63 U/L   Alkaline Phosphatase 75 38 - 126 U/L   Total Bilirubin 0.7 0.3 - 1.2 mg/dL   GFR calc non Af Amer 24 (L) >60 mL/min   GFR calc Af Amer 28 (L) >60 mL/min    Comment: (NOTE) The eGFR has been calculated using the CKD EPI equation. This calculation has not been validated in all clinical situations. eGFR's persistently <60 mL/min signify possible Chronic Kidney Disease.    Anion gap 10 5 - 15  Lipase, blood     Status: Abnormal   Collection Time: 07/13/14  2:25 AM  Result Value Ref Range   Lipase 21 (L) 22 - 51 U/L  I-Stat CG4 Lactic Acid, ED     Status: None   Collection Time: 07/13/14  2:32 AM  Result Value Ref Range   Lactic Acid, Venous 0.96 0.5 - 2.0 mmol/L    Assessment/Plan: CHF exacerbation Resolved at time of discharge from lesion. Now patient again with increased swelling despite taking his Lasix as directed. Denies respiratory symptoms. Lung examination unremarkable. Vital signs stable with normotensive blood pressure. 40 units IM Lasix given in clinic.  Supportive measures reviewed. We'll stop chronic Lasix prescription. Continue Aldactone as directed. We'll Rx torsemide 40 mg daily to see if patient has become resistant to Lasix therapy. Follow-up with cardiology tomorrow as scheduled for further assessment. Continue potassium supplementation. Patient instructed to proceed directly to the ER or call 911 if symptoms acutely worsen over the night.

## 2014-07-27 NOTE — Assessment & Plan Note (Signed)
Resolved at time of discharge from lesion. Now patient again with increased swelling despite taking his Lasix as directed. Denies respiratory symptoms. Lung examination unremarkable. Vital signs stable with normotensive blood pressure. 40 units IM Lasix given in clinic. Supportive measures reviewed. We'll stop chronic Lasix prescription. Continue Aldactone as directed. We'll Rx torsemide 40 mg daily to see if patient has become resistant to Lasix therapy. Follow-up with cardiology tomorrow as scheduled for further assessment. Continue potassium supplementation. Patient instructed to proceed directly to the ER or call 911 if symptoms acutely worsen over the night.

## 2014-09-06 ENCOUNTER — Other Ambulatory Visit: Payer: Self-pay | Admitting: Physician Assistant

## 2014-09-07 NOTE — Telephone Encounter (Signed)
Rx request to pharmacy/[facility]  

## 2014-09-25 ENCOUNTER — Other Ambulatory Visit: Payer: Self-pay | Admitting: Physician Assistant

## 2014-09-25 NOTE — Telephone Encounter (Signed)
Refill sent per [facility] refill protocol/SLS  

## 2014-10-16 ENCOUNTER — Emergency Department (HOSPITAL_BASED_OUTPATIENT_CLINIC_OR_DEPARTMENT_OTHER)
Admission: EM | Admit: 2014-10-16 | Discharge: 2014-10-17 | Disposition: A | Discharge status: Home / Self Care | Payer: Managed Care, Other (non HMO) | Attending: Emergency Medicine | Admitting: Emergency Medicine

## 2014-10-16 DIAGNOSIS — N183 Chronic kidney disease, stage 3 (moderate): Secondary | ICD-10-CM | POA: Insufficient documentation

## 2014-10-16 DIAGNOSIS — M109 Gout, unspecified: Secondary | ICD-10-CM | POA: Diagnosis not present

## 2014-10-16 DIAGNOSIS — R51 Headache: Secondary | ICD-10-CM | POA: Diagnosis present

## 2014-10-16 DIAGNOSIS — Z79899 Other long term (current) drug therapy: Secondary | ICD-10-CM | POA: Insufficient documentation

## 2014-10-16 DIAGNOSIS — I130 Hypertensive heart and chronic kidney disease with heart failure and stage 1 through stage 4 chronic kidney disease, or unspecified chronic kidney disease: Secondary | ICD-10-CM | POA: Insufficient documentation

## 2014-10-16 DIAGNOSIS — E669 Obesity, unspecified: Secondary | ICD-10-CM | POA: Insufficient documentation

## 2014-10-16 DIAGNOSIS — G43909 Migraine, unspecified, not intractable, without status migrainosus: Secondary | ICD-10-CM

## 2014-10-16 DIAGNOSIS — I509 Heart failure, unspecified: Secondary | ICD-10-CM | POA: Insufficient documentation

## 2014-10-16 MED ORDER — SODIUM CHLORIDE 0.9 % IV BOLUS (SEPSIS)
1000.0000 mL | Freq: Once | INTRAVENOUS | Status: AC
  Administered 2014-10-17: 1000 mL via INTRAVENOUS

## 2014-10-16 MED ORDER — KETOROLAC TROMETHAMINE 30 MG/ML IJ SOLN
30.0000 mg | Freq: Once | INTRAMUSCULAR | Status: AC
  Administered 2014-10-17: 30 mg via INTRAVENOUS
  Filled 2014-10-16: qty 1

## 2014-10-16 MED ORDER — DIPHENHYDRAMINE HCL 50 MG/ML IJ SOLN
25.0000 mg | Freq: Once | INTRAMUSCULAR | Status: AC
  Administered 2014-10-17: 25 mg via INTRAVENOUS
  Filled 2014-10-16: qty 1

## 2014-10-16 MED ORDER — DEXAMETHASONE SODIUM PHOSPHATE 10 MG/ML IJ SOLN
10.0000 mg | Freq: Once | INTRAMUSCULAR | Status: AC
  Administered 2014-10-17: 10 mg via INTRAVENOUS
  Filled 2014-10-16: qty 1

## 2014-10-16 MED ORDER — METOCLOPRAMIDE HCL 5 MG/ML IJ SOLN
10.0000 mg | Freq: Once | INTRAMUSCULAR | Status: AC
  Administered 2014-10-17: 10 mg via INTRAVENOUS
  Filled 2014-10-16: qty 2

## 2014-10-16 NOTE — ED Notes (Signed)
Pt reports headache x1 week has Hx of migraines and blurred vision ambulates with asteady gait no hemispheric weakness noted

## 2014-10-16 NOTE — ED Notes (Signed)
Pt c/o migraine type headache for a week that is relieved somewhat with OTC meds but never fully goes away.  He c/o photosensitivity, denies n/v.

## 2014-10-16 NOTE — ED Provider Notes (Signed)
CSN: [id number]     Arrival date & time 10/16/14  2257 History  By signing my name below, I, Helane Gunther, attest that this documentation has been prepared under the direction and in the presence of Veryl Speak, MD. Electronically Signed: Helane Gunther, ED Scribe. 10/16/2014. 11:44 PM.    Chief Complaint  Patient presents with  . Migraine   Patient is a 34 y.o. male presenting with migraines. The history is provided by the patient. No language interpreter was used.  Migraine This is a recurrent problem. The current episode started more than 1 week ago. The problem occurs constantly. The problem has not changed since onset.Associated symptoms include headaches. He has tried acetaminophen for the symptoms.   HPI Comments: Frank Coffey is a 34 y.o. male who presents to the Emergency Department complaining of a  Migraine onset 1 week ago. He reports associated constant, aching, frontal  Headache and blurry vision. Pt states he has tried taking ibuprofen and Excedrin with mild relief. He notes he has tried exercising more and eating healthy with no relief. He notes he has had a similar HA a few years ago for which he was admitted and sent to a specialist, where he was diagnosed with migraines. He states it was treated effectively with a shot. He notes a PMHx of HTN and CHF and states he takes medication for both. He also reports a PSHx of retinal detachment repair. Pt denies n/v, fever, and congestion.  Past Medical History  Diagnosis Date  . CHF (congestive heart failure)   . 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 Chronic kidney disease, stage 3, mod decreased GFR   . Gout   . Hyperlipidemia   . Obesity   . Hx of migraine headaches    Past Surgical History  Procedure Laterality Date  . Cholecystectomy    . Retinal detachment repair w/ scleral buckle le      Left   Family History  Problem Relation Age of Onset  . Hypertension Mother    . Diabetes Neg Hx   . Heart attack Neg Hx   . Hyperlipidemia Mother   . Sudden death Neg Hx   . Heart disease Mother   . Hypertension Maternal Grandmother   . Stroke Maternal Grandfather   . Stroke Maternal Grandmother   . Hypertension Maternal Grandfather   . Heart disease Maternal Grandmother   . Heart disease Maternal Grandfather   . Hyperlipidemia Maternal Grandmother   . Hyperlipidemia Maternal Grandfather   . Cancer - Other Paternal Grandmother   . Alzheimer's disease Maternal Grandfather   . Healthy Brother     x2  . Healthy Sister     x2  . Healthy Son     x2  . Healthy Daughter     x1   Social History  Substance Use Topics  . Smoking status: Never Smoker   . Smokeless tobacco: Never Used  . Alcohol Use: Yes     Comment: socially -- once every few months    Review of Systems  Constitutional: Negative for fever.  HENT: Negative for congestion.   Eyes: Positive for visual disturbance.  Gastrointestinal: Negative for nausea and vomiting.  Neurological: Positive for headaches.  All other systems reviewed and are negative.   Allergies  Review of patient's allergies indicates no known allergies.  Home Medications   Prior to Admission medications   Medication Sig Start Date End Date Taking? Authorizing Jerrard Bradburn  allopurinol (ZYLOPRIM) 100 MG tablet Take 1 tablet (100 mg total) by mouth daily. 01/02/14   Brunetta Jeans, PA-C  calcitRIOL (ROCALTROL) 0.25 MCG capsule  11/28/13   Historical Selvin Yun, MD  carvedilol (COREG) 25 MG tablet Take 1 tablet (25 mg total) by mouth 2 (two) times daily with a meal. 01/02/14   Brunetta Jeans, PA-C  EQ FIBER SUPPLEMENT PO Take by mouth daily.    Historical Sherin Murdoch, MD  hydrALAZINE (APRESOLINE) 100 MG tablet TAKE 1 TABLET BY MOUTH THREE TIMES DAILY 09/07/14   Brunetta Jeans, PA-C  Linaclotide [facility]) 145 MCG CAPS capsule Take 1 capsule (145 mcg total) by mouth daily. 07/22/14   Brunetta Jeans, PA-C  NON FORMULARY Take 2  capsules by mouth daily. Colon Clenz Herbal    Historical Judson Tsan, MD  olmesartan (BENICAR) 40 MG tablet Take 1 tablet (40 mg total) by mouth daily. 01/02/14   Brunetta Jeans, PA-C  Potassium Chloride ER 20 MEQ TBCR Take 20 mEq by mouth daily. 01/02/14   Brunetta Jeans, PA-C  spironolactone (ALDACTONE) 25 MG tablet Take 1 tablet (25 mg total) by mouth daily. 01/02/14   Brunetta Jeans, PA-C  torsemide (DEMADEX) 20 MG tablet TAKE 2 TABLETS BY MOUTH DAILY 09/25/14   Brunetta Jeans, PA-C   BP 164/108 mmHg  Pulse 88  Temp(Src) 98.2 F (36.8 C) (Oral)  Resp 18  SpO2 94% Physical Exam  Constitutional: He is oriented to person, place, and time. He appears well-developed and well-nourished.  HENT:  Head: Normocephalic and atraumatic.  Eyes: EOM are normal. Pupils are equal, round, and reactive to light.  No papilledema  Neck: Normal range of motion.  Cardiovascular: Normal rate, regular rhythm, normal heart sounds and intact distal pulses.   Pulmonary/Chest: Effort normal and breath sounds normal. No respiratory distress.  Abdominal: Soft. He exhibits no distension. There is no tenderness.  Musculoskeletal: Normal range of motion.  Neurological: He is alert and oriented to person, place, and time. No cranial nerve deficit. He exhibits normal muscle tone. Coordination normal.  Skin: Skin is warm and dry.  Psychiatric: He has a normal mood and affect. Judgment normal.  Nursing note and vitals reviewed.   ED Course  Procedures  DIAGNOSTIC STUDIES: Oxygen Saturation is 94% on RA, low by my interpretation.    COORDINATION OF CARE: 11:38 PM - Discussed plans to order a migraine cocktail. Pt advised of plan for treatment and pt agrees.  Labs Review Labs Reviewed - No data to display  Imaging Review No results found. I have personally reviewed and evaluated these images and lab results as part of my medical decision-making.   EKG Interpretation None      MDM   Final  diagnoses:  None    Patient given a migraine cocktail with complete resolution of all symptoms. His headaches are consistent with prior migraines, his neuro exam is normal, and he has responded to medications. I do not feel as though any further workup or imaging is indicated. He will return as needed if he experiences any additional problems.  I personally performed the services described in this documentation, which was scribed in my presence. The recorded information has been reviewed and is accurate.      Veryl Speak, MD 10/17/14 [phone]

## 2014-10-17 NOTE — ED Notes (Signed)
Pt verbalizes understanding of d/c instructions and denies any further needs at this time. 

## 2014-10-17 NOTE — Discharge Instructions (Signed)

## 2014-10-20 ENCOUNTER — Other Ambulatory Visit: Payer: Self-pay | Admitting: Physician Assistant

## 2014-11-16 ENCOUNTER — Ambulatory Visit (INDEPENDENT_AMBULATORY_CARE_PROVIDER_SITE_OTHER): Payer: Managed Care, Other (non HMO) | Admitting: Medical

## 2014-11-16 ENCOUNTER — Telehealth: Payer: Self-pay | Admitting: *Deleted

## 2014-11-16 ENCOUNTER — Encounter: Payer: Self-pay | Admitting: Medical

## 2014-11-16 ENCOUNTER — Encounter (HOSPITAL_BASED_OUTPATIENT_CLINIC_OR_DEPARTMENT_OTHER): Payer: Self-pay | Admitting: *Deleted

## 2014-11-16 ENCOUNTER — Emergency Department (HOSPITAL_BASED_OUTPATIENT_CLINIC_OR_DEPARTMENT_OTHER): Payer: PRIVATE HEALTH INSURANCE

## 2014-11-16 ENCOUNTER — Ambulatory Visit (HOSPITAL_BASED_OUTPATIENT_CLINIC_OR_DEPARTMENT_OTHER)
Admission: RE | Admit: 2014-11-16 | Discharge: 2014-11-16 | Disposition: A | Discharge status: Home / Self Care | Payer: PRIVATE HEALTH INSURANCE | Source: Ambulatory Visit | Attending: Medical | Admitting: Medical

## 2014-11-16 ENCOUNTER — Emergency Department (HOSPITAL_BASED_OUTPATIENT_CLINIC_OR_DEPARTMENT_OTHER)
Admission: EM | Admit: 2014-11-16 | Discharge: 2014-11-16 | Disposition: A | Discharge status: Home / Self Care | Payer: PRIVATE HEALTH INSURANCE | Attending: Emergency Medicine | Admitting: Emergency Medicine

## 2014-11-16 ENCOUNTER — Telehealth: Payer: Self-pay | Admitting: Medical

## 2014-11-16 ENCOUNTER — Other Ambulatory Visit: Payer: Self-pay | Admitting: Medical

## 2014-11-16 VITALS — BP 130/88 | HR 87 | Temp 98.1°F | Ht 68.0 in | Wt >= 6400 oz

## 2014-11-16 DIAGNOSIS — N183 Chronic kidney disease, stage 3 (moderate): Secondary | ICD-10-CM | POA: Insufficient documentation

## 2014-11-16 DIAGNOSIS — I5020 Unspecified systolic (congestive) heart failure: Secondary | ICD-10-CM

## 2014-11-16 DIAGNOSIS — R51 Headache: Secondary | ICD-10-CM

## 2014-11-16 DIAGNOSIS — R799 Abnormal finding of blood chemistry, unspecified: Secondary | ICD-10-CM | POA: Diagnosis not present

## 2014-11-16 DIAGNOSIS — R0600 Dyspnea, unspecified: Secondary | ICD-10-CM | POA: Diagnosis not present

## 2014-11-16 DIAGNOSIS — R519 Headache, unspecified: Secondary | ICD-10-CM

## 2014-11-16 DIAGNOSIS — M109 Gout, unspecified: Secondary | ICD-10-CM | POA: Diagnosis not present

## 2014-11-16 DIAGNOSIS — E669 Obesity, unspecified: Secondary | ICD-10-CM | POA: Diagnosis not present

## 2014-11-16 DIAGNOSIS — D649 Anemia, unspecified: Secondary | ICD-10-CM

## 2014-11-16 DIAGNOSIS — I130 Hypertensive heart and chronic kidney disease with heart failure and stage 1 through stage 4 chronic kidney disease, or unspecified chronic kidney disease: Secondary | ICD-10-CM | POA: Insufficient documentation

## 2014-11-16 DIAGNOSIS — I509 Heart failure, unspecified: Secondary | ICD-10-CM | POA: Insufficient documentation

## 2014-11-16 DIAGNOSIS — R600 Localized edema: Secondary | ICD-10-CM

## 2014-11-16 DIAGNOSIS — Z7189 Other specified counseling: Secondary | ICD-10-CM

## 2014-11-16 DIAGNOSIS — R7989 Other specified abnormal findings of blood chemistry: Secondary | ICD-10-CM

## 2014-11-16 DIAGNOSIS — H539 Unspecified visual disturbance: Secondary | ICD-10-CM

## 2014-11-16 DIAGNOSIS — H538 Other visual disturbances: Secondary | ICD-10-CM | POA: Insufficient documentation

## 2014-11-16 DIAGNOSIS — Z79899 Other long term (current) drug therapy: Secondary | ICD-10-CM | POA: Insufficient documentation

## 2014-11-16 HISTORY — DX: Essential (primary) hypertension: I10

## 2014-11-16 HISTORY — DX: Obstructive sleep apnea (adult) (pediatric): G47.33

## 2014-11-16 LAB — CBC WITH DIFFERENTIAL/PLATELET
BASOS ABS: 0 10*3/uL (ref 0.0–0.1)
BASOS PCT: 0 % (ref 0–1)
EOS ABS: 0 10*3/uL (ref 0.0–0.7)
EOS PCT: 0 % (ref 0–5)
HEMATOCRIT: 37 % — AB (ref 39.0–52.0)
Hemoglobin: 11.7 g/dL — ABNORMAL LOW (ref 13.0–17.0)
Lymphocytes Relative: 8 % — ABNORMAL LOW (ref 12–46)
Lymphs Abs: 1 10*3/uL (ref 0.7–4.0)
MCH: 19.6 pg — ABNORMAL LOW (ref 26.0–34.0)
MCHC: 31.6 g/dL (ref 30.0–36.0)
MCV: 61.9 fL — AB (ref 78.0–100.0)
MONO ABS: 0.4 10*3/uL (ref 0.1–1.0)
MONOS PCT: 3 % (ref 3–12)
MPV: 8.7 fL (ref 8.6–12.4)
Neutro Abs: 10.6 10*3/uL — ABNORMAL HIGH (ref 1.7–7.7)
Neutrophils Relative %: 89 % — ABNORMAL HIGH (ref 43–77)
PLATELETS: 366 10*3/uL (ref 150–400)
RBC: 5.98 MIL/uL — ABNORMAL HIGH (ref 4.22–5.81)
RDW: 20.8 % — ABNORMAL HIGH (ref 11.5–15.5)
WBC: 11.9 10*3/uL — AB (ref 4.0–10.5)

## 2014-11-16 LAB — COMPREHENSIVE METABOLIC PANEL
ALK PHOS: 97 U/L (ref 40–115)
ALT: 26 U/L (ref 9–46)
AST: 28 U/L (ref 10–40)
Albumin: 3.8 g/dL (ref 3.6–5.1)
BUN: 30 mg/dL — AB (ref 7–25)
CALCIUM: 9.3 mg/dL (ref 8.6–10.3)
CO2: 28 mmol/L (ref 20–31)
Chloride: 99 mmol/L (ref 98–110)
Creat: 2.99 mg/dL — ABNORMAL HIGH (ref 0.60–1.35)
Glucose, Bld: 149 mg/dL — ABNORMAL HIGH (ref 65–99)
POTASSIUM: 3.3 mmol/L — AB (ref 3.5–5.3)
Sodium: 137 mmol/L (ref 135–146)
Total Bilirubin: 0.5 mg/dL (ref 0.2–1.2)
Total Protein: 7.4 g/dL (ref 6.1–8.1)

## 2014-11-16 MED ORDER — KETOROLAC TROMETHAMINE 30 MG/ML IJ SOLN
30.0000 mg | Freq: Once | INTRAMUSCULAR | Status: DC
  Filled 2014-11-16: qty 1

## 2014-11-16 MED ORDER — METOCLOPRAMIDE HCL 5 MG/ML IJ SOLN
10.0000 mg | Freq: Once | INTRAMUSCULAR | Status: AC
  Administered 2014-11-16: 10 mg via INTRAVENOUS
  Filled 2014-11-16: qty 2

## 2014-11-16 MED ORDER — DEXAMETHASONE SODIUM PHOSPHATE 10 MG/ML IJ SOLN
10.0000 mg | Freq: Once | INTRAMUSCULAR | Status: AC
  Administered 2014-11-16: 10 mg via INTRAVENOUS
  Filled 2014-11-16: qty 1

## 2014-11-16 MED ORDER — SODIUM CHLORIDE 0.9 % IV BOLUS (SEPSIS)
1000.0000 mL | Freq: Once | INTRAVENOUS | Status: AC
  Administered 2014-11-16: 1000 mL via INTRAVENOUS

## 2014-11-16 MED ORDER — TORSEMIDE 20 MG PO TABS: 40.0000 mg | ORAL_TABLET | Freq: Every day | ORAL | Status: DC

## 2014-11-16 MED ORDER — DIPHENHYDRAMINE HCL 50 MG/ML IJ SOLN
25.0000 mg | Freq: Once | INTRAMUSCULAR | Status: AC
  Administered 2014-11-16: 25 mg via INTRAVENOUS
  Filled 2014-11-16: qty 1

## 2014-11-16 MED ORDER — SUMATRIPTAN SUCCINATE 50 MG PO TABS: 50.0000 mg | ORAL_TABLET | ORAL | Status: DC | PRN

## 2014-11-16 NOTE — Patient Instructions (Addendum)
For your migraine ha history. Will rx imitrex. Also refer you to neurologist. If you have ha that returns with neurologic signs or symptoms despite imitrex then ED evaluation at [facility] ED. Since in that event may need mri.  Pt has hx of chf. Will refer to local cardiologist. Today get cmp, bnp, and cxr. May need to adjust diuretic treatment. But first want to assess gfr since quite low gfr in the past.  I am also checking our anemia level.  Regarding decreased urination. If pt hs decreased gfr significant compared to his baseline might advise ED evaluation or try to refer to nephrologist asap.    Follow up in 5 days or as needed

## 2014-11-16 NOTE — Telephone Encounter (Signed)
Frank Coffey last note mentions he has cardiologist so can refer back to him.

## 2014-11-16 NOTE — Progress Notes (Signed)
Pre visit review using our clinic review tool, if applicable. No additional management support is needed unless otherwise documented below in the visit note. 

## 2014-11-16 NOTE — ED Notes (Signed)
Back from CT, no changes.  

## 2014-11-16 NOTE — ED Notes (Addendum)
Pt sleeping upon entering room to triage pt. OSA noted. Here for HA, onset 2d ago, seen here for same 2 weeks ago, h/o same, describes as migraine with visual involvement, describes vision as foggy and see rainbows around lights, no relief with excedrin, h/o neck injection by specialist in [place] years ago, PCP is upstairs, rates 7/10, describes as comes and goes, "had one yesterday and again today".

## 2014-11-16 NOTE — Progress Notes (Signed)
Subjective:    Patient ID: Frank Coffey, male    DOB: February 04, 1980, 34 y.o.   MRN: [id number]  HPI  Pt in states he went to ED for ha. Pt was given migraine cocktail medicine. 2 hours later his symptoms resolved completley. Pt had CT of his head this am.  It was negative. Pt states with ha yesterday he had brief tingle sensation to his left arm that lasted for seconds. Then resolved as ha resolved.   Pt states 4 yrs ago he got injection in back of cranium region.(Pt states admitted for severe ha back then) Then ha went away but returned about 1 month ago.   Pt thinks ha may be coming back faintly now/maybe early. He gets some light sensitive and sound sensitive typically with his ha.  Pt has elevated bp hx  and has known low gfr. Pt has known history of anemia.  Pt states some pedal edema recently, with some weight gain and some mild sob. Pt has hx of bnp elevated. Pt weight in ED was 382 lb in Ed this am. Not 352 lb.   Pt was out of town for 4 months.   Pt states he last urinated 2 am today. Did not urinate much last 2 days total.  Pt does see nephrologist.  Pt states when he was with [facility]. They were going to refer him to cardiologist.      Review of Systems  Constitutional: Negative for fever, chills, diaphoresis, activity change and fatigue.  Respiratory: Positive for shortness of breath. Negative for cough and chest tightness.        Mild dyspnea.  Cardiovascular: Negative for chest pain, palpitations and leg swelling.  Gastrointestinal: Negative for nausea, vomiting and abdominal pain.  Genitourinary: Negative for dysuria, frequency, decreased urine volume, genital sores and testicular pain.       Decreased urination.  Musculoskeletal: Negative for neck pain and neck stiffness.       Pedal edema.   Neurological: Negative for dizziness, tremors, seizures, syncope, facial asymmetry, speech difficulty, weakness, light-headedness, numbness and headaches.    Psychiatric/Behavioral: Negative for behavioral problems, confusion and agitation. The patient is not nervous/anxious.     Past Medical History  Diagnosis Date  . CHF (congestive heart failure) ([place])   . 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 Chronic kidney disease, stage 3, mod decreased GFR   . Gout   . Hyperlipidemia   . Obesity   . Hx of migraine headaches   . Hypertension   . OSA (obstructive sleep apnea)     Social History   Social History  . Marital Status: Married    Spouse Name: N/A  . Number of Children: N/A  . Years of Education: N/A   Occupational History  . Not on file.   Social History Main Topics  . Smoking status: Never Smoker   . Smokeless tobacco: Never Used  . Alcohol Use: Yes     Comment: socially -- once every few months  . Drug Use: No  . Sexual Activity: Not on file   Other Topics Concern  . Not on file   Social History Narrative    Past Surgical History  Procedure Laterality Date  . Cholecystectomy    . Retinal detachment repair w/ scleral buckle le      Left    Family History  Problem Relation Age of Onset  . Hypertension Mother   . Diabetes  Neg Hx   . Heart attack Neg Hx   . Hyperlipidemia Mother   . Sudden death Neg Hx   . Heart disease Mother   . Hypertension Maternal Grandmother   . Stroke Maternal Grandfather   . Stroke Maternal Grandmother   . Hypertension Maternal Grandfather   . Heart disease Maternal Grandmother   . Heart disease Maternal Grandfather   . Hyperlipidemia Maternal Grandmother   . Hyperlipidemia Maternal Grandfather   . Cancer - Other Paternal Grandmother   . Alzheimer's disease Maternal Grandfather   . Healthy Brother     x2  . Healthy Sister     x2  . Healthy Son     x2  . Healthy Daughter     x1    No Known Allergies  Current Outpatient Prescriptions on File Prior to Visit  Medication Sig Dispense Refill  . allopurinol (ZYLOPRIM)  100 MG tablet Take 1 tablet (100 mg total) by mouth daily. 30 tablet 3  . calcitRIOL (ROCALTROL) 0.25 MCG capsule     . carvedilol (COREG) 25 MG tablet Take 1 tablet (25 mg total) by mouth 2 (two) times daily with a meal. 180 tablet 1  . EQ FIBER SUPPLEMENT PO Take by mouth daily.    . hydrALAZINE (APRESOLINE) 100 MG tablet TAKE 1 TABLET BY MOUTH THREE TIMES DAILY 90 tablet 0  . LINZESS 145 MCG CAPS capsule TAKE 1 CAPSULE BY MOUTH EVERY DAY 30 capsule 2  . NON FORMULARY Take 2 capsules by mouth daily. Colon Clenz Herbal    . olmesartan (BENICAR) 40 MG tablet Take 1 tablet (40 mg total) by mouth daily. 90 tablet 1  . Potassium Chloride ER 20 MEQ TBCR Take 20 mEq by mouth daily. 60 tablet 2  . spironolactone (ALDACTONE) 25 MG tablet Take 1 tablet (25 mg total) by mouth daily. 90 tablet 1   No current facility-administered medications on file prior to visit.    BP 130/88 mmHg  Pulse 87  Temp(Src) 98.1 F (36.7 C) (Oral)  Ht 5\' 8"  (1.727 m)  Wt 400 lb (181.439 kg)  BMI 60.83 kg/m2  SpO2 98%      Objective:   Physical Exam  General Mental Status- Alert. General Appearance- Not in acute distress.   Skin General: Color- Normal Color. Moisture- Normal Moisture.  Neck Carotid Arteries- Normal color. Moisture- Normal Moisture. No carotid bruits. No JVD.  Chest and Lung Exam Auscultation: Breath Sounds:-Normal.  Cardiovascular Auscultation:Rythm- Regular. Murmurs & Other Heart Sounds:Auscultation of the heart reveals- No Murmurs.  Abdomen Inspection:-Inspeection Normal. Palpation/Percussion:Note:No mass. Palpation and Percussion of the abdomen reveal- Non Tender, Non Distended + BS, no rebound or guarding.    Neurologic Cranial Nerve exam:- CN III-XII intact(No nystagmus), symmetric smile. Drift Test:- No drift. Romberg Exam:- Negative.  Heal to Toe Gait exam:-Normal. Finger to Nose:- Normal/Intact Strength:- 5/5 equal and symmetric strength both upper and lower  extremities.  Lower ext- 1+ pedal edema. Calves symmetric. Negative homans signs.      Assessment & Plan:  For your migraine ha history. Will rx imitrex. Also refer you to neurologist. If you have ha that returns with neurologic signs or symptoms despite imitrex then ED evaluation at [facility] ED. Since in that event may need mri.  Pt has hx of chf. Will refer to local cardiologist. Today get cmp, bnp, and cxr. May need to adjust diuretic treatment. But first want to assess gfr since quite low gfr in the past.  I am also checking  our anemia level.  Regarding decreased urination. If pt hs decreased gfr significant compared to his baseline might advise ED evaluation or try to refer to nephrologist asap.    Follow up in 5 days or as needed

## 2014-11-16 NOTE — Discharge Instructions (Signed)

## 2014-11-16 NOTE — Addendum Note (Signed)
Addended by: Anabel Halon on: 11/16/2014 09:31 PM   Modules accepted: Orders

## 2014-11-16 NOTE — Telephone Encounter (Signed)
Patient was in to see Percell Miller for hospital follow-up today, requested Referral to [facility] for Bariatric pre-surgery counseling & education; completed and provider informed/SLS

## 2014-11-16 NOTE — ED Notes (Signed)
toradol not given d/t h/o [facility]

## 2014-11-16 NOTE — ED Notes (Signed)
Pt to CT, sleeping, arousable to voice, NAD, calm, interactive.

## 2014-11-16 NOTE — ED Provider Notes (Signed)
CSN: [id number]     Arrival date & time 11/16/14  0159 History   First MD Initiated Contact with Patient 11/16/14 0229     Chief Complaint  Patient presents with  . Headache     (Consider location/radiation/quality/duration/timing/severity/associated sxs/prior Treatment) HPI Comments: Patient is a 34 year old male with history of headaches. He presents today for evaluation of headache. He also reports that he is having blurry vision. He denies any head injury or trauma. He denies any fevers or chills. He denies any stiff neck. He is somewhat of a difficult historian. He reports that he was in [place] and received injections in his neck several years ago and his headaches went away. They are now recently starting up again.  Patient is a 34 y.o. male presenting with headaches. The history is provided by the patient.  Headache Pain location:  Generalized Quality:  Dull Radiates to:  Does not radiate Timing:  Constant Progression:  Worsening Chronicity:  New Context: bright light   Relieved by:  Nothing Worsened by:  Nothing Ineffective treatments:  None tried   Past Medical History  Diagnosis Date  . CHF (congestive heart failure) ([place])   . 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 Chronic kidney disease, stage 3, mod decreased GFR   . Gout   . Hyperlipidemia   . Obesity   . Hx of migraine headaches   . Hypertension   . OSA (obstructive sleep apnea)    Past Surgical History  Procedure Laterality Date  . Cholecystectomy    . Retinal detachment repair w/ scleral buckle le      Left   Family History  Problem Relation Age of Onset  . Hypertension Mother   . Diabetes Neg Hx   . Heart attack Neg Hx   . Hyperlipidemia Mother   . Sudden death Neg Hx   . Heart disease Mother   . Hypertension Maternal Grandmother   . Stroke Maternal Grandfather   . Stroke Maternal Grandmother   . Hypertension Maternal Grandfather   .  Heart disease Maternal Grandmother   . Heart disease Maternal Grandfather   . Hyperlipidemia Maternal Grandmother   . Hyperlipidemia Maternal Grandfather   . Cancer - Other Paternal Grandmother   . Alzheimer's disease Maternal Grandfather   . Healthy Brother     x2  . Healthy Sister     x2  . Healthy Son     x2  . Healthy Daughter     x1   Social History  Substance Use Topics  . Smoking status: Never Smoker   . Smokeless tobacco: Never Used  . Alcohol Use: Yes     Comment: socially -- once every few months    Review of Systems  Neurological: Positive for headaches.  All other systems reviewed and are negative.     Allergies  Review of patient's allergies indicates no known allergies.  Home Medications   Prior to Admission medications   Medication Sig Start Date End Date Taking? Authorizing Provider  allopurinol (ZYLOPRIM) 100 MG tablet Take 1 tablet (100 mg total) by mouth daily. 01/02/14   Brunetta Jeans, PA-C  calcitRIOL (ROCALTROL) 0.25 MCG capsule  11/28/13   Historical Provider, MD  carvedilol (COREG) 25 MG tablet Take 1 tablet (25 mg total) by mouth 2 (two) times daily with a meal. 01/02/14   Brunetta Jeans, PA-C  EQ FIBER SUPPLEMENT PO Take by mouth daily.    Historical  Provider, MD  hydrALAZINE (APRESOLINE) 100 MG tablet TAKE 1 TABLET BY MOUTH THREE TIMES DAILY 09/07/14   Brunetta Jeans, PA-C  LINZESS 145 MCG CAPS capsule TAKE 1 CAPSULE BY MOUTH EVERY DAY 10/21/14   Brunetta Jeans, PA-C  NON FORMULARY Take 2 capsules by mouth daily. Colon Clenz Herbal    Historical Provider, MD  olmesartan (BENICAR) 40 MG tablet Take 1 tablet (40 mg total) by mouth daily. 01/02/14   Brunetta Jeans, PA-C  Potassium Chloride ER 20 MEQ TBCR Take 20 mEq by mouth daily. 01/02/14   Brunetta Jeans, PA-C  spironolactone (ALDACTONE) 25 MG tablet Take 1 tablet (25 mg total) by mouth daily. 01/02/14   Brunetta Jeans, PA-C  torsemide (DEMADEX) 20 MG tablet TAKE 2 TABLETS BY  MOUTH DAILY 09/25/14   Brunetta Jeans, PA-C   BP 175/110 mmHg  Pulse 97  Temp(Src) 98.5 F (36.9 C) (Oral)  Resp 18  Ht 5' 8.5" (1.74 m)  Wt 352 lb (159.666 kg)  BMI 52.74 kg/m2  SpO2 96% Physical Exam  Constitutional: He is oriented to person, place, and time. He appears well-developed and well-nourished. No distress.  HENT:  Head: Normocephalic and atraumatic.  Mouth/Throat: Oropharynx is clear and moist.  Eyes: EOM are normal. Pupils are equal, round, and reactive to light.  Neck: Normal range of motion. Neck supple.  Cardiovascular: Normal rate, regular rhythm and normal heart sounds.   No murmur heard. Pulmonary/Chest: Effort normal and breath sounds normal. No respiratory distress. He has no wheezes.  Abdominal: Soft. Bowel sounds are normal.  Musculoskeletal: Normal range of motion.  Neurological: He is alert and oriented to person, place, and time. No cranial nerve deficit. He exhibits normal muscle tone. Coordination normal.  Skin: Skin is warm and dry. He is not diaphoretic.  Nursing note and vitals reviewed.   ED Course  Procedures (including critical care time) Labs Review Labs Reviewed - No data to display  Imaging Review No results found. I have personally reviewed and evaluated these images and lab results as part of my medical decision-making.   EKG Interpretation None      MDM   Final diagnoses:  None    Patient presents with complaints of headache and blurry vision. He has a history of migraines however has been migraine free for some time until recently. He is neurologically intact, CT scan of his head is unremarkable, and he is feeling better after medications. He will be discharged with instructions to follow-up with his primary doctor or neurologist.    Veryl Speak, MD 11/16/14 [phone]

## 2014-11-17 ENCOUNTER — Telehealth: Payer: Self-pay | Admitting: Medical

## 2014-11-17 LAB — CREATININE WITH EST GFR
CREATININE: 3.11 mg/dL — AB (ref 0.60–1.35)
GFR, EST AFRICAN AMERICAN: 28 mL/min — AB (ref >=60)
GFR, EST NON AFRICAN AMERICAN: 24 mL/min — AB (ref >=60)

## 2014-11-17 LAB — BRAIN NATRIURETIC PEPTIDE: BRAIN NATRIURETIC PEPTIDE: 102.2 pg/mL — AB (ref 0.0–100.0)

## 2014-11-17 NOTE — Telephone Encounter (Signed)
Pt is feeling better. He did urinate twice last night. NO ha. I reviewed labs with him. With his weight up to 400 lb and his pedal edema. He will take 2 of his demadex twice a day for today and wed. Then on Thursday he will go back to 2 tab a day. Then I will see him on Thursday for weight recheck and repeat cmp with gfr.

## 2014-11-17 NOTE — Telephone Encounter (Signed)
GFR not done on the lab. So our lab called lab and asked them to run the gfr stat. Call us with result and fax [place] the result.

## 2014-11-19 ENCOUNTER — Encounter: Payer: Managed Care, Other (non HMO) | Admitting: Medical

## 2014-11-19 DIAGNOSIS — Z0289 Encounter for other administrative examinations: Secondary | ICD-10-CM

## 2014-11-19 NOTE — Progress Notes (Signed)
This encounter was created in error - please disregard.

## 2014-11-20 ENCOUNTER — Telehealth: Payer: Self-pay | Admitting: Medical

## 2014-11-20 NOTE — Telephone Encounter (Signed)
Pt was no show 11/19/14 4:45pm for follow up, pt has not rescheduled, charge or no charge?

## 2014-11-20 NOTE — Telephone Encounter (Signed)
charge 

## 2014-11-29 ENCOUNTER — Other Ambulatory Visit: Payer: Self-pay | Admitting: Physician Assistant

## 2014-11-30 NOTE — Progress Notes (Signed)
Cardiology Office Note   Date:  12/01/2014   ID:  Frank Coffey, DOB [birth year], MRN [id number]  PCP:  Leeanne Rio, PA-C  Cardiologist:   Sharol Harness, MD   Chief Complaint  Patient presents with  . New Evaluation    pt c/o no chest pain, no SOB, no swelling in legs, no light headedness or dizziness      History of Present Illness: Frank Coffey is a 34 y.o. male with a prior history of dilated cardiomyopathy with return to normal systolic function, hypertension and OSA who presents for an evaluation of heart failure. Frank Coffey was in the ED on 10/31 with a complaint of headache. At that time he was noted to be volume overloaded and received a dose of IV Lasix. He denied any shortness of breath, orthopnea or PND. He followed up with his primary care provider, [name], PA-C  and his torsemide was increased to twice daily.  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 He was in the hospital for one week. And followed up with a cardiologist annually for several years. He reports that his heart returned to normal and he was dismissed from cardiology clinic at least 3 years ago. Since that time she's not had any shortness of breath, lower extremity edema, orthopnea or  Given that he has a history of heart failure he was referred to cardiology for further evaluation.  At that appointment BNP was mildly elevated to 102. He was hypokalemic at 3.3.  He had normal renal function and was mildly anemic with a hematocrit of 37.  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  He was started valacyclovir and has had significant improvement in his vision. He no longer has headaches. He also reports a history of retinal detachment in the left eye prior to this infection.    Frank Coffey has started  exercising since he was seen in the ED. He was more pounds and is down to 381. He walks once daily for about an hour each day. 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 He denies any exertional shortness of breath. His wife has made some dietary changes for the family as well.   . Past Medical History  Diagnosis Date  . CHF (congestive heart failure) ([name])   . Hypertensive heart disease with congestive heart failure and stage 3 kidney disease ([name])   . Chronic kidney disease, stage 3, mod decreased GFR   . Gout   . Hyperlipidemia   . Obesity   . Hx of migraine headaches   . Hypertension   . OSA (obstructive sleep apnea)     Past Surgical History  Procedure Laterality Date  . Cholecystectomy    . Retinal detachment repair w/ scleral buckle le      Left     Current Outpatient Prescriptions  Medication Sig Dispense Refill  . allopurinol (ZYLOPRIM) 100 MG tablet Take 1 tablet (100 mg total) by mouth daily. 30 tablet 3  . calcitRIOL (ROCALTROL) 0.25 MCG capsule     . carvedilol (COREG) 25 MG tablet Take 1 tablet (25 mg total) by mouth 2 (two) times daily with a meal. 180 tablet 1  . EQ FIBER SUPPLEMENT PO Take by mouth daily.    [name]  hydrALAZINE (APRESOLINE) 100 MG tablet TAKE 1 TABLET BY MOUTH THREE TIMES DAILY 90 tablet 0  . LINZESS 145 MCG CAPS capsule TAKE 1 CAPSULE BY MOUTH EVERY DAY 30 capsule 2  . NON FORMULARY Take 2 capsules by mouth daily. Colon Clenz Herbal    . olmesartan (BENICAR) 40 MG tablet Take 1 tablet (40 mg total) by mouth daily. 90 tablet 1  . Potassium Chloride ER 20 MEQ TBCR Take 20 mEq by mouth daily. 60 tablet 2  . spironolactone (ALDACTONE) 25 MG tablet Take 1 tablet (25 mg total) by mouth daily. 90 tablet 1  . torsemide (DEMADEX) 20 MG tablet Take 2 tablets (40 mg total) by mouth daily. 60 tablet 1  . valACYclovir (VALTREX) 1000 MG tablet Take 1,000 mg by mouth 3 (three) times daily.     No current facility-administered medications for  this visit.    Allergies:   Review of patient's allergies indicates no known allergies.    Social History:  The patient  reports that he has never smoked. He has never used smokeless tobacco. He reports that he drinks alcohol. He reports that he does not use illicit drugs.   Family History:  The patient's family history includes Alzheimer's disease in his maternal grandfather; Cancer - Other in his paternal grandmother; Healthy in his brother, daughter, sister, and son; Heart disease in his maternal grandfather, maternal grandmother, and mother; Hyperlipidemia in his maternal grandfather, maternal grandmother, and mother; Hypertension in his maternal grandfather, maternal grandmother, and mother; Stroke in his maternal grandfather and maternal grandmother. There is no history of Diabetes, Heart attack, or Sudden death.    ROS:  Please see the history of present illness.   Otherwise, review of systems are positive for none.   All other systems are reviewed and negative.    PHYSICAL EXAM: VS:  BP 126/82 mmHg  Pulse 76  Ht 5' 8.5" (1.74 m)  Wt 175.633 kg (387 lb 3.2 oz)  BMI 58.01 kg/m2 , BMI Body mass index is 58.01 kg/(m^2). GENERAL:  Well appearing.  Morbidly obese. HEENT:  Pupils equal round and reactive, fundi not visualized, oral mucosa unremarkable NECK:  No jugular venous distention, waveform within normal limits, carotid upstroke brisk and symmetric, no bruits, no thyromegaly LYMPHATICS:  No cervical adenopathy LUNGS:  Clear to auscultation bilaterally HEART:  RRR.  PMI not displaced or sustained,S1 and S2 within normal limits, no S3, no S4, no clicks, no rubs, no murmurs. Distant heart sounds.  ABD:  Flat, positive bowel sounds normal in frequency in pitch, no bruits, no rebound, no guarding, no midline pulsatile mass, no hepatomegaly, no splenomegaly EXT:  2 plus pulses throughout, no edema, no cyanosis no clubbing SKIN:  No rashes no nodules NEURO:  Cranial nerves II through XII  grossly intact, motor grossly intact throughout PSYCH:  Cognitively intact, oriented to person place and time   EKG:  EKG is ordered today. The ekg ordered today demonstrates sinus rhythm rate 76 per minute. Left atrial enlargement. Left ventricular hypertrophy with repolarization abnormalities.  Echo 06/19/11: (Outside report)  LVEF 60%.  Mild LVH.    Recent Labs: 07/13/2014: B Natriuretic Peptide 308.6* 11/16/2014: ALT 26; BUN 30*; Creat 3.11*; Creat 2.99*; Hemoglobin 11.7*; Platelets 366; Potassium 3.3*; Sodium 137    Lipid Panel    Component Value Date/Time   CHOL 207* 03/10/2013 1535   TRIG 200* 03/10/2013 1535   HDL 39* 03/10/2013 1535   CHOLHDL 5.3 03/10/2013 1535   VLDL 40 03/10/2013 1535  [place] 128* 03/10/2013 1535      Wt Readings from Last 3 Encounters:  12/01/14 175.633 kg (387 lb 3.2 oz)  11/16/14 181.439 kg (400 lb)  11/16/14 159.666 kg (352 lb)      ASSESSMENT AND PLAN:  # Chronic systolic heart failure: Mr. 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 He recently presented with volume overload that has now group and he is euvolemic on exam. We will check a basic metabolic panel to assess his renal function and potassium. Will not make any other changes to his heart failure medications at this time. He will continue taking torsemide 40 mg twice daily and olmestartan.  # Hypertension: Well-controlled on olmesartan.  # Morbid Obesity: Mr. Going has started exercising and was congratulated on these efforts. He has lost nearly 20 pounds, which is likely a combination water weight and exercise. He was also encouraged to keep up his dietary changes.   # CKD: Mr. Oelke has CK D4. He reports that he has been seen by a nephrologist and has a follow-up appointment in December.  Current medicines are reviewed at length with the patient today.  The patient does not have concerns regarding  medicines.  The following changes have been made:  no change  Labs/ tests ordered today include:   Orders Placed This Encounter  Procedures  . Basic metabolic panel  . EKG 12-Lead  . ECHOCARDIOGRAM COMPLETE     Disposition:   FU with Daimen Shovlin C. Oval Linsey, MD in 6 months.    Signed, Sharol Harness, MD  12/01/2014 9:48 AM    [facility]

## 2014-12-01 ENCOUNTER — Encounter: Payer: Self-pay | Admitting: Cardiovascular Disease

## 2014-12-01 ENCOUNTER — Ambulatory Visit (INDEPENDENT_AMBULATORY_CARE_PROVIDER_SITE_OTHER): Payer: Managed Care, Other (non HMO) | Admitting: Cardiovascular Disease

## 2014-12-01 VITALS — BP 126/82 | HR 76 | Ht 68.5 in | Wt 387.2 lb

## 2014-12-01 DIAGNOSIS — I10 Essential (primary) hypertension: Secondary | ICD-10-CM | POA: Diagnosis not present

## 2014-12-01 DIAGNOSIS — I5022 Chronic systolic (congestive) heart failure: Secondary | ICD-10-CM

## 2014-12-01 DIAGNOSIS — Z79899 Other long term (current) drug therapy: Secondary | ICD-10-CM | POA: Diagnosis not present

## 2014-12-01 LAB — BASIC METABOLIC PANEL
BUN: 29 mg/dL — ABNORMAL HIGH (ref 7–25)
CO2: 26 mmol/L (ref 20–31)
Calcium: 9.1 mg/dL (ref 8.6–10.3)
Chloride: 101 mmol/L (ref 98–110)
Creat: 3 mg/dL — ABNORMAL HIGH (ref 0.60–1.35)
Glucose, Bld: 79 mg/dL (ref 65–99)
POTASSIUM: 3.6 mmol/L (ref 3.5–5.3)
SODIUM: 138 mmol/L (ref 135–146)

## 2014-12-01 NOTE — Patient Instructions (Signed)
Medication Instructions:  Please continue your current medications.  Labwork: Your physician recommends that you return for lab work in [place].  Testing/Procedures: Your physician has requested that you have an echocardiogram. Echocardiography is a painless test that uses sound waves to create images of your heart. It provides your doctor with information about the size and shape of your heart and how well your heart's chambers and valves are working. This procedure takes approximately one hour. There are no restrictions for this procedure. This will be done at our [facility] location. The address is [address], [address]. The phone number [phone]  Follow-Up: Dr Oval Linsey recommends that you schedule a follow-up appointment in 6 months. You will receive a reminder letter in the mail two months in advance. If you don't receive a letter, please call our office to schedule the follow-up appointment.  If you need a refill on your cardiac medications before your next appointment, please call your pharmacy.

## 2014-12-08 ENCOUNTER — Telehealth: Payer: Self-pay | Admitting: *Deleted

## 2014-12-08 NOTE — Telephone Encounter (Signed)
-----   Message from Skeet Latch, MD sent at 12/06/2014  7:31 PM EST ----- Kidney function stable.  No changes to medications.

## 2014-12-08 NOTE — Telephone Encounter (Signed)
Spoke to patient. Result given . Verbalized understanding  

## 2014-12-09 ENCOUNTER — Other Ambulatory Visit: Payer: Self-pay

## 2014-12-09 ENCOUNTER — Ambulatory Visit (HOSPITAL_COMMUNITY): Payer: Managed Care, Other (non HMO) | Attending: Cardiology

## 2014-12-09 DIAGNOSIS — E785 Hyperlipidemia, unspecified: Secondary | ICD-10-CM | POA: Insufficient documentation

## 2014-12-09 DIAGNOSIS — I340 Nonrheumatic mitral (valve) insufficiency: Secondary | ICD-10-CM | POA: Insufficient documentation

## 2014-12-09 DIAGNOSIS — I517 Cardiomegaly: Secondary | ICD-10-CM | POA: Diagnosis not present

## 2014-12-09 DIAGNOSIS — I10 Essential (primary) hypertension: Secondary | ICD-10-CM | POA: Insufficient documentation

## 2014-12-09 DIAGNOSIS — I5022 Chronic systolic (congestive) heart failure: Secondary | ICD-10-CM | POA: Diagnosis not present

## 2014-12-09 DIAGNOSIS — I509 Heart failure, unspecified: Secondary | ICD-10-CM | POA: Diagnosis present

## 2014-12-17 ENCOUNTER — Telehealth: Payer: Self-pay | Admitting: *Deleted

## 2014-12-17 NOTE — Telephone Encounter (Signed)
Spoke to patient. Result given . Verbalized understanding SCHEDULE 01/19/15 WITH DR [place] FOR 1 MONTH F/U

## 2014-12-17 NOTE — Telephone Encounter (Signed)
-----   Message from Skeet Latch, MD sent at 12/11/2014 12:22 PM EST ----- Echo shows his heart is not squeezing well again.  EF is 30%.  Continue medications as ordered and schedule for 1 month follow up.

## 2014-12-23 ENCOUNTER — Ambulatory Visit: Payer: Self-pay | Admitting: Neurology

## 2014-12-28 ENCOUNTER — Encounter: Payer: Self-pay | Admitting: Physician Assistant

## 2014-12-28 ENCOUNTER — Ambulatory Visit (INDEPENDENT_AMBULATORY_CARE_PROVIDER_SITE_OTHER): Payer: Managed Care, Other (non HMO) | Admitting: Physician Assistant

## 2014-12-28 VITALS — BP 148/108 | HR 89 | Temp 98.8°F | Ht 68.5 in | Wt 389.6 lb

## 2014-12-28 DIAGNOSIS — B9689 Other specified bacterial agents as the cause of diseases classified elsewhere: Secondary | ICD-10-CM

## 2014-12-28 DIAGNOSIS — J00 Acute nasopharyngitis [common cold]: Secondary | ICD-10-CM

## 2014-12-28 DIAGNOSIS — J208 Acute bronchitis due to other specified organisms: Principal | ICD-10-CM

## 2014-12-28 MED ORDER — AZITHROMYCIN 250 MG PO TABS: ORAL_TABLET | ORAL | Status: DC

## 2014-12-28 MED ORDER — BENZONATATE 100 MG PO CAPS: 100.0000 mg | ORAL_CAPSULE | Freq: Two times a day (BID) | ORAL | Status: DC | PRN

## 2014-12-28 MED ORDER — TORSEMIDE 20 MG PO TABS: 40.0000 mg | ORAL_TABLET | Freq: Every day | ORAL | Status: DC

## 2014-12-28 NOTE — Assessment & Plan Note (Signed)
Rx Azithromycin.  Increase fluids.  Rest.  Saline nasal spray.  Probiotic.  Mucinex as directed.  Humidifier in bedroom. Tessalon per orders.  Giving history of CHF patient encouraged to continue chronic medications and daily weights.   Call or return to clinic if symptoms are not improving.

## 2014-12-28 NOTE — Progress Notes (Signed)
Patient with history of CHF and CKD presents to clinic today c/o chest congestion, cough (sometimes productive for green sputum) over the past week but acutely worsening since the weekend. Denies headaches, SOB or worsening swelling or weight. Denies orthopnea or PND at present. Is taking diuretics as directed.  Of note BP elevated. Patient has not taken all medications yet this morning.   Past Medical History  Diagnosis Date  . CHF (congestive heart failure) ([place])   . Hypertensive heart disease with congestive heart failure and stage 3 kidney disease ([place])   . Chronic kidney disease, stage 3, mod decreased GFR   . Gout   . Hyperlipidemia   . Obesity   . Hx of migraine headaches   . Hypertension   . OSA (obstructive sleep apnea)     Current Outpatient Prescriptions on File Prior to Visit  Medication Sig Dispense Refill  . allopurinol (ZYLOPRIM) 100 MG tablet Take 1 tablet (100 mg total) by mouth daily. 30 tablet 3  . calcitRIOL (ROCALTROL) 0.25 MCG capsule     . carvedilol (COREG) 25 MG tablet Take 1 tablet (25 mg total) by mouth 2 (two) times daily with a meal. 180 tablet 1  . EQ FIBER SUPPLEMENT PO Take by mouth daily.    . hydrALAZINE (APRESOLINE) 100 MG tablet TAKE 1 TABLET BY MOUTH THREE TIMES DAILY 90 tablet 0  . LINZESS 145 MCG CAPS capsule TAKE 1 CAPSULE BY MOUTH EVERY DAY 30 capsule 2  . NON FORMULARY Take 2 capsules by mouth daily. Colon Clenz Herbal    . olmesartan (BENICAR) 40 MG tablet Take 1 tablet (40 mg total) by mouth daily. 90 tablet 1  . Potassium Chloride ER 20 MEQ TBCR Take 20 mEq by mouth daily. 60 tablet 2  . spironolactone (ALDACTONE) 25 MG tablet Take 1 tablet (25 mg total) by mouth daily. 90 tablet 1  . valACYclovir (VALTREX) 1000 MG tablet Take 1,000 mg by mouth 3 (three) times daily.     No current facility-administered medications on file prior to visit.    No Known Allergies  Family History  Problem Relation Age of Onset  . Hypertension Mother    . Hyperlipidemia Mother   . Heart disease Mother   . Diabetes Neg Hx   . Heart attack Neg Hx   . Sudden death Neg Hx   . Hypertension Maternal Grandmother   . Stroke Maternal Grandmother   . Heart disease Maternal Grandmother   . Hyperlipidemia Maternal Grandmother   . Stroke Maternal Grandfather   . Hypertension Maternal Grandfather   . Heart disease Maternal Grandfather   . Hyperlipidemia Maternal Grandfather   . Alzheimer's disease Maternal Grandfather   . Cancer - Other Paternal Grandmother   . Healthy Brother     x2  . Healthy Sister     x2  . Healthy Son     x2  . Healthy Daughter     x1    Social History   Social History  . Marital Status: Married    Spouse Name: N/A  . Number of Children: N/A  . Years of Education: N/A   Social History Main Topics  . Smoking status: Never Smoker   . Smokeless tobacco: Never Used  . Alcohol Use: Yes     Comment: socially -- once every few months  . Drug Use: No  . Sexual Activity: Not Asked   Other Topics Concern  . None   Social History Narrative  Epworth Sleepiness Scale = 10 (as of 12/01/14)    Review of Systems - See HPI.  All other ROS are negative.  BP 148/108 mmHg  Pulse 89  Temp(Src) 98.8 F (37.1 C) (Oral)  Ht 5' 8.5" (1.74 m)  Wt 389 lb 9.6 oz (176.721 kg)  BMI 58.37 kg/m2  SpO2 97%  Physical Exam  Constitutional: He is oriented to person, place, and time and well-developed, well-nourished, and in no distress.  HENT:  Head: Normocephalic and atraumatic.  Right Ear: External ear normal.  Left Ear: External ear normal.  Nose: Nose normal.  Mouth/Throat: Oropharynx is clear and moist. No oropharyngeal exudate.  TM within normal limits bilaterally.  Eyes: Conjunctivae are normal. Pupils are equal, round, and reactive to light.  Neck: Neck supple.  Cardiovascular: Normal rate, regular rhythm, normal heart sounds and intact distal pulses.   Pulses:      Posterior tibial pulses are 2+ on the right  side, and 2+ on the left side.  No edema noted on examination  Pulmonary/Chest: Effort normal and breath sounds normal. No respiratory distress. He has no wheezes. He has no rales. He exhibits no tenderness.  Lymphadenopathy:    He has no cervical adenopathy.  Neurological: He is alert and oriented to person, place, and time.  Skin: Skin is dry. No rash noted.  Psychiatric: Affect normal.  Vitals reviewed.   Recent Results (from the past 2160 hour(s))  CBC w/Diff     Status: Abnormal   Collection Time: 11/16/14  4:56 PM  Result Value Ref Range   WBC 11.9 (H) 4.0 - 10.5 K/uL   RBC 5.98 (H) 4.22 - 5.81 MIL/uL   Hemoglobin 11.7 (L) 13.0 - 17.0 g/dL   HCT 37.0 (L) 39.0 - 52.0 %   MCV 61.9 (L) 78.0 - 100.0 fL   MCH 19.6 (L) 26.0 - 34.0 pg   MCHC 31.6 30.0 - 36.0 g/dL   RDW 20.8 (H) 11.5 - 15.5 %   Platelets 366 150 - 400 K/uL   MPV 8.7 8.6 - 12.4 fL   Neutrophils Relative % 89 (H) 43 - 77 %   Neutro Abs 10.6 (H) 1.7 - 7.7 K/uL   Lymphocytes Relative 8 (L) 12 - 46 %   Lymphs Abs 1.0 0.7 - 4.0 K/uL   Monocytes Relative 3 3 - 12 %   Monocytes Absolute 0.4 0.1 - 1.0 K/uL   Eosinophils Relative 0 0 - 5 %   Eosinophils Absolute 0.0 0.0 - 0.7 K/uL   Basophils Relative 0 0 - 1 %   Basophils Absolute 0.0 0.0 - 0.1 K/uL   Smear Review Criteria for review not met   Comp Met (CMET)     Status: Abnormal   Collection Time: 11/16/14  4:56 PM  Result Value Ref Range   Sodium 137 135 - 146 mmol/L   Potassium 3.3 (L) 3.5 - 5.3 mmol/L   Chloride 99 98 - 110 mmol/L   CO2 28 20 - 31 mmol/L   Glucose, Bld 149 (H) 65 - 99 mg/dL   BUN 30 (H) 7 - 25 mg/dL   Creat 2.99 (H) 0.60 - 1.35 mg/dL   Total Bilirubin 0.5 0.2 - 1.2 mg/dL   Alkaline Phosphatase 97 40 - 115 U/L   AST 28 10 - 40 U/L   ALT 26 9 - 46 U/L   Total Protein 7.4 6.1 - 8.1 g/dL   Albumin 3.8 3.6 - 5.1 g/dL   Calcium 9.3 8.6 -  10.3 mg/dL  B Nat Peptide     Status: Abnormal   Collection Time: 11/16/14  4:56 PM  Result Value Ref  Range   Brain Natriuretic Peptide 102.2 (H) 0.0 - 100.0 pg/mL  Creatinine with Est GFR     Status: Abnormal   Collection Time: 11/16/14  4:56 PM  Result Value Ref Range   Creat 3.11 (H) 0.60 - 1.35 mg/dL   GFR, Est African American 28 (L) >=60 mL/min   GFR, Est Non African American 24 (L) >=60 mL/min    Comment:   The estimated GFR is a calculation valid for adults (>=26 years old) that uses the CKD-EPI algorithm to adjust for age and sex. It is   not to be used for children, pregnant women, hospitalized patients,    patients on dialysis, or with rapidly changing kidney function. According to the NKDEP, eGFR >89 is normal, 60-89 shows mild impairment, 30-59 shows moderate impairment, 15-29 shows severe impairment and <15 is ESRD.     Basic metabolic panel     Status: Abnormal   Collection Time: 12/01/14 10:03 AM  Result Value Ref Range   Sodium 138 135 - 146 mmol/L   Potassium 3.6 3.5 - 5.3 mmol/L   Chloride 101 98 - 110 mmol/L   CO2 26 20 - 31 mmol/L   Glucose, Bld 79 65 - 99 mg/dL   BUN 29 (H) 7 - 25 mg/dL   Creat 3.00 (H) 0.60 - 1.35 mg/dL   Calcium 9.1 8.6 - 10.3 mg/dL    Assessment/Plan: Acute bacterial bronchitis Rx Azithromycin.  Increase fluids.  Rest.  Saline nasal spray.  Probiotic.  Mucinex as directed.  Humidifier in bedroom. Tessalon per orders.  Giving history of CHF patient encouraged to continue chronic medications and daily weights.   Call or return to clinic if symptoms are not improving.

## 2014-12-28 NOTE — Patient Instructions (Signed)
Please continue chronic medications as directed.  Stay hydrated and take Mucinex (plain) Take antibiotic as directed. Take the Tessalon as directed for cough.  Even though I see no signs of a heart failure exacerbation. Please continue your daily weights. If you notice them increasing despite medications, notify me or your Cardiologist.

## 2014-12-28 NOTE — Progress Notes (Signed)
Pre visit review using our clinic review tool, if applicable. No additional management support is needed unless otherwise documented below in the visit note. 

## 2014-12-30 ENCOUNTER — Telehealth: Payer: Self-pay | Admitting: Physician Assistant

## 2014-12-30 ENCOUNTER — Ambulatory Visit: Payer: Self-pay

## 2014-12-30 NOTE — Telephone Encounter (Signed)
Patient given antibiotic at visit 2 days ago. Make sure he is taking as directed. He needs to complete the entire course of medication. Will take longer than 2 days to work on symptoms.

## 2014-12-30 NOTE — Telephone Encounter (Signed)
Called and spoke with the pt and informed him of the note below.  Pt verbalized he understood and agreed.//AB/CMA

## 2014-12-30 NOTE — Telephone Encounter (Signed)
Relation to [id number] Call back number:[phone] Pharmacy: [id number] [place], [place] [phone] (Phone) [phone] (Fax)         Reason for call:  As per patient symptoms have not improved requesting antibiotics

## 2015-01-06 ENCOUNTER — Ambulatory Visit: Payer: Managed Care, Other (non HMO)

## 2015-01-19 ENCOUNTER — Encounter: Payer: Self-pay | Admitting: Cardiovascular Disease

## 2015-01-19 NOTE — Progress Notes (Signed)
This encounter was created in error - please disregard.

## 2015-01-26 NOTE — Progress Notes (Signed)
Cardiology Office Note   Date:  01/26/2015   ID:  Frank Coffey, DOB [birth year], MRN [id number]  PCP:  Leeanne Rio, PA-C  Cardiologist:   Sharol Harness, MD   No chief complaint on file.   Patient ID: Frank Coffey is a 35 y.o. male with a prior history of dilated cardiomyopathy with return to normal systolic function, hypertension and OSA who presents for an evaluation of heart failure.   Interval History 01/27/15: At his last appointment Frank Coffey had labs checked but no medication changes.  He underwent echocardiography that revealed a reduction in his LVEF to 30%.  He was treated for bronchitis by his PCP on 12/28/14.   History of Present Illness 11/30/14:  Frank Coffey was in the ED on 10/31 with a complaint of headache. At that time he was noted to be volume overloaded and received a dose of IV Lasix. He denied any shortness of breath, orthopnea or PND. He followed up with his primary care provider, [name], PA-C  and his torsemide was increased to twice daily.  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 He was in the hospital for one week. And followed up with a cardiologist annually for several years. He reports that his heart returned to normal and he was dismissed from cardiology clinic at least 3 years ago. Since that time she's not had any shortness of breath, lower extremity edema, orthopnea or  Given that he has a history of heart failure he was referred to cardiology for further evaluation.  At that appointment BNP was mildly elevated to 102. He was hypokalemic at 3.3.  He had normal renal function and was mildly anemic with a hematocrit of 37.  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  He was started valacyclovir and has had significant improvement in his vision. He no  longer has headaches. He also reports a history of retinal detachment in the left eye prior to this infection.    Frank Coffey has started exercising since he was seen in the ED. He was more pounds and is down to 381. He walks once daily for about an hour each day. 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 He denies any exertional shortness of breath. His wife has made some dietary changes for the family as well.   . Past Medical History  Diagnosis Date  . CHF (congestive heart failure) ([place])   . Hypertensive heart disease with congestive heart failure and stage 3 kidney disease ([place])   . Chronic kidney disease, stage 3, mod decreased GFR   . Gout   . Hyperlipidemia   . Obesity   . Hx of migraine headaches   . Hypertension   . OSA (obstructive sleep apnea)     Past Surgical History  Procedure Laterality Date  . Cholecystectomy    . Retinal detachment repair w/ scleral buckle le      Left     Current Outpatient Prescriptions  Medication Sig Dispense Refill  . allopurinol (ZYLOPRIM) 100 MG tablet Take 1 tablet (100 mg total) by mouth daily. 30 tablet 3  . azithromycin (ZITHROMAX) 250 MG tablet Take 2 tablets on Day 1. Then take 1 tablet daily. 6 tablet 0  . benzonatate (TESSALON) 100  MG capsule Take 1 capsule (100 mg total) by mouth 2 (two) times daily as needed for cough. 20 capsule 0  . calcitRIOL (ROCALTROL) 0.25 MCG capsule     . carvedilol (COREG) 25 MG tablet Take 1 tablet (25 mg total) by mouth 2 (two) times daily with a meal. 180 tablet 1  . EQ FIBER SUPPLEMENT PO Take by mouth daily.    . hydrALAZINE (APRESOLINE) 100 MG tablet TAKE 1 TABLET BY MOUTH THREE TIMES DAILY 90 tablet 0  . LINZESS 145 MCG CAPS capsule TAKE 1 CAPSULE BY MOUTH EVERY DAY 30 capsule 2  . NON FORMULARY Take 2 capsules by mouth daily. Colon Clenz Herbal    . olmesartan (BENICAR) 40 MG tablet Take 1 tablet (40 mg total) by mouth daily. 90 tablet 1  . Potassium Chloride ER 20 MEQ  TBCR Take 20 mEq by mouth daily. 60 tablet 2  . spironolactone (ALDACTONE) 25 MG tablet Take 1 tablet (25 mg total) by mouth daily. 90 tablet 1  . torsemide (DEMADEX) 20 MG tablet Take 2 tablets (40 mg total) by mouth daily. 60 tablet 1  . valACYclovir (VALTREX) 1000 MG tablet Take 1,000 mg by mouth 3 (three) times daily.     No current facility-administered medications for this visit.    Allergies:   Review of patient's allergies indicates no known allergies.    Social History:  The patient  reports that he has never smoked. He has never used smokeless tobacco. He reports that he drinks alcohol. He reports that he does not use illicit drugs.   Family History:  The patient's family history includes Alzheimer's disease in his maternal grandfather; Cancer - Other in his paternal grandmother; Healthy in his brother, daughter, sister, and son; Heart disease in his maternal grandfather, maternal grandmother, and mother; Hyperlipidemia in his maternal grandfather, maternal grandmother, and mother; Hypertension in his maternal grandfather, maternal grandmother, and mother; Stroke in his maternal grandfather and maternal grandmother. There is no history of Diabetes, Heart attack, or Sudden death.    ROS:  Please see the history of present illness.   Otherwise, review of systems are positive for none.   All other systems are reviewed and negative.    PHYSICAL EXAM: VS:  There were no vitals taken for this visit. , BMI There is no weight on file to calculate BMI. GENERAL:  Well appearing.  Morbidly obese. HEENT:  Pupils equal round and reactive, fundi not visualized, oral mucosa unremarkable NECK:  No jugular venous distention, waveform within normal limits, carotid upstroke brisk and symmetric, no bruits, no thyromegaly LYMPHATICS:  No cervical adenopathy LUNGS:  Clear to auscultation bilaterally HEART:  RRR.  PMI not displaced or sustained,S1 and S2 within normal limits, no S3, no S4, no clicks, no  rubs, no murmurs. Distant heart sounds.  ABD:  Flat, positive bowel sounds normal in frequency in pitch, no bruits, no rebound, no guarding, no midline pulsatile mass, no hepatomegaly, no splenomegaly EXT:  2 plus pulses throughout, no edema, no cyanosis no clubbing SKIN:  No rashes no nodules NEURO:  Cranial nerves II through XII grossly intact, motor grossly intact throughout PSYCH:  Cognitively intact, oriented to person place and time   EKG:  EKG is ordered today. The ekg ordered today demonstrates sinus rhythm rate 76 per minute. Left atrial enlargement. Left ventricular hypertrophy with repolarization abnormalities.  Echo 06/19/11: (Outside report)  LVEF 60%.  Mild LVH.   Echo 12/09/14: Study Conclusions  - Left ventricle: The  cavity size was mildly dilated. Wall thickness was increased in a pattern of mild LVH. The estimated ejection fraction was 30%. Diffuse hypokinesis. Doppler parameters are consistent with abnormal left ventricular relaxation (grade 1 diastolic dysfunction). - Aortic valve: There was no stenosis. - Mitral valve: There was trivial regurgitation. - Left atrium: The atrium was mildly dilated. - Right ventricle: The cavity size was mildly dilated. Systolic function was normal. - Pulmonary arteries: No complete TR doppler jet so unable to estimate PA systolic pressure. - Inferior vena cava: The vessel was normal in size. The respirophasic diameter changes were in the normal range (>= 50%), consistent with normal central venous pressure.  Impressions:  - Mildly dilated LV with mild LV hypertrophy. EF 30%, diffuse hypokinesis. Mildly dilated RV with normal systolic function. No significant valvular abnormalities.   Recent Labs: 07/13/2014: B Natriuretic Peptide 308.6* 11/16/2014: ALT 26; Hemoglobin 11.7*; Platelets 366 12/01/2014: BUN 29*; Creat 3.00*; Potassium 3.6; Sodium 138    Lipid Panel    Component Value Date/Time   CHOL  207* 03/10/2013 1535   TRIG 200* 03/10/2013 1535   HDL 39* 03/10/2013 1535   CHOLHDL 5.3 03/10/2013 1535   VLDL 40 03/10/2013 1535   LDLCALC 128* 03/10/2013 1535      Wt Readings from Last 3 Encounters:  12/28/14 176.721 kg (389 lb 9.6 oz)  12/01/14 175.633 kg (387 lb 3.2 oz)  11/16/14 181.439 kg (400 lb)      ASSESSMENT AND PLAN:  # Chronic systolic heart failure: Frank Coffey has a history of systolic heart failure occurred in the setting of viral cardiomyopathy currently improved. He recently presented with volume overload that has now group and he is euvolemic on exam. We will check a basic metabolic panel to assess his renal function and potassium. Will not make any other changes to his heart failure medications at this time. He will continue taking torsemide 40 mg twice daily and olmestartan.  # Hypertension: Well-controlled on olmesartan.  # Morbid Obesity: Frank Coffey has started exercising and was congratulated on these efforts. He has lost nearly 20 pounds, which is likely a combination water weight and exercise. He was also encouraged to keep up his dietary changes.   # CKD: Frank Coffey has CK D4. He reports that he has been seen by a nephrologist and has a follow-up appointment in December.  Current medicines are reviewed at length with the patient today.  The patient does not have concerns regarding medicines.  The following changes have been made:  no change  Labs/ tests ordered today include:   No orders of the defined types were placed in this encounter.     Disposition:   FU with Frank Ey C. Oval Linsey, MD in 6 months.    Signed, Sharol Harness, MD  01/26/2015 8:06 PM    [place] This encounter was created in error - please disregard.

## 2015-01-27 ENCOUNTER — Encounter: Payer: Self-pay | Admitting: Cardiovascular Disease

## 2015-02-04 ENCOUNTER — Encounter: Payer: Self-pay | Admitting: *Deleted

## 2015-02-09 ENCOUNTER — Other Ambulatory Visit: Payer: Self-pay | Admitting: Physician Assistant

## 2015-02-15 ENCOUNTER — Other Ambulatory Visit: Payer: Self-pay | Admitting: Physician Assistant

## 2015-04-25 ENCOUNTER — Observation Stay (HOSPITAL_BASED_OUTPATIENT_CLINIC_OR_DEPARTMENT_OTHER)
Admission: EM | Admit: 2015-04-25 | Discharge: 2015-04-26 | Disposition: A | Discharge status: Home / Self Care | Payer: Managed Care, Other (non HMO) | Attending: Internal Medicine | Admitting: Internal Medicine

## 2015-04-25 ENCOUNTER — Encounter (HOSPITAL_BASED_OUTPATIENT_CLINIC_OR_DEPARTMENT_OTHER): Payer: Self-pay | Admitting: Emergency Medicine

## 2015-04-25 ENCOUNTER — Emergency Department (HOSPITAL_BASED_OUTPATIENT_CLINIC_OR_DEPARTMENT_OTHER): Payer: Managed Care, Other (non HMO)

## 2015-04-25 DIAGNOSIS — N184 Chronic kidney disease, stage 4 (severe): Secondary | ICD-10-CM

## 2015-04-25 DIAGNOSIS — Z6841 Body Mass Index (BMI) 40.0 and over, adult: Secondary | ICD-10-CM | POA: Diagnosis not present

## 2015-04-25 DIAGNOSIS — I130 Hypertensive heart and chronic kidney disease with heart failure and stage 1 through stage 4 chronic kidney disease, or unspecified chronic kidney disease: Secondary | ICD-10-CM | POA: Insufficient documentation

## 2015-04-25 DIAGNOSIS — E669 Obesity, unspecified: Secondary | ICD-10-CM | POA: Diagnosis not present

## 2015-04-25 DIAGNOSIS — I129 Hypertensive chronic kidney disease with stage 1 through stage 4 chronic kidney disease, or unspecified chronic kidney disease: Secondary | ICD-10-CM | POA: Diagnosis present

## 2015-04-25 DIAGNOSIS — R69 Illness, unspecified: Secondary | ICD-10-CM | POA: Diagnosis not present

## 2015-04-25 DIAGNOSIS — I150 Renovascular hypertension: Secondary | ICD-10-CM | POA: Diagnosis not present

## 2015-04-25 DIAGNOSIS — I420 Dilated cardiomyopathy: Secondary | ICD-10-CM | POA: Diagnosis not present

## 2015-04-25 DIAGNOSIS — M109 Gout, unspecified: Secondary | ICD-10-CM | POA: Diagnosis not present

## 2015-04-25 DIAGNOSIS — I5042 Chronic combined systolic (congestive) and diastolic (congestive) heart failure: Secondary | ICD-10-CM | POA: Diagnosis not present

## 2015-04-25 DIAGNOSIS — Z79899 Other long term (current) drug therapy: Secondary | ICD-10-CM | POA: Diagnosis not present

## 2015-04-25 DIAGNOSIS — R42 Dizziness and giddiness: Principal | ICD-10-CM | POA: Insufficient documentation

## 2015-04-25 DIAGNOSIS — I10 Essential (primary) hypertension: Secondary | ICD-10-CM | POA: Diagnosis not present

## 2015-04-25 DIAGNOSIS — R778 Other specified abnormalities of plasma proteins: Secondary | ICD-10-CM

## 2015-04-25 DIAGNOSIS — R7989 Other specified abnormal findings of blood chemistry: Secondary | ICD-10-CM

## 2015-04-25 DIAGNOSIS — G4733 Obstructive sleep apnea (adult) (pediatric): Secondary | ICD-10-CM | POA: Insufficient documentation

## 2015-04-25 DIAGNOSIS — I132 Hypertensive heart and chronic kidney disease with heart failure and with stage 5 chronic kidney disease, or end stage renal disease: Secondary | ICD-10-CM | POA: Diagnosis present

## 2015-04-25 DIAGNOSIS — Z992 Dependence on renal dialysis: Secondary | ICD-10-CM | POA: Diagnosis present

## 2015-04-25 LAB — BRAIN NATRIURETIC PEPTIDE: B Natriuretic Peptide: 39.1 pg/mL (ref 0.0–100.0)

## 2015-04-25 LAB — COMPREHENSIVE METABOLIC PANEL
ALT: 30 U/L (ref 17–63)
AST: 34 U/L (ref 15–41)
Albumin: 3.5 g/dL (ref 3.5–5.0)
Alkaline Phosphatase: 84 U/L (ref 38–126)
Anion gap: 4 — ABNORMAL LOW (ref 5–15)
BILIRUBIN TOTAL: 0.6 mg/dL (ref 0.3–1.2)
BUN: 24 mg/dL — ABNORMAL HIGH (ref 6–20)
CALCIUM: 8.8 mg/dL — AB (ref 8.9–10.3)
CO2: 31 mmol/L (ref 22–32)
CREATININE: 3.26 mg/dL — AB (ref 0.61–1.24)
Chloride: 102 mmol/L (ref 101–111)
GFR, EST AFRICAN AMERICAN: 26 mL/min — AB (ref >60)
GFR, EST NON AFRICAN AMERICAN: 23 mL/min — AB (ref >60)
Glucose, Bld: 100 mg/dL — ABNORMAL HIGH (ref 65–99)
Potassium: 3.3 mmol/L — ABNORMAL LOW (ref 3.5–5.1)
Sodium: 137 mmol/L (ref 135–145)
TOTAL PROTEIN: 7.6 g/dL (ref 6.5–8.1)

## 2015-04-25 LAB — URINALYSIS, ROUTINE W REFLEX MICROSCOPIC
BILIRUBIN URINE: NEGATIVE
Glucose, UA: NEGATIVE mg/dL
HGB URINE DIPSTICK: NEGATIVE
KETONES UR: NEGATIVE mg/dL
Leukocytes, UA: NEGATIVE
Nitrite: NEGATIVE
Protein, ur: 30 mg/dL — AB
SPECIFIC GRAVITY, URINE: 1.012 (ref 1.005–1.030)
pH: 5.5 (ref 5.0–8.0)

## 2015-04-25 LAB — LIPASE, BLOOD: LIPASE: 22 U/L (ref 11–51)

## 2015-04-25 LAB — TROPONIN I
TROPONIN I: 0.05 ng/mL — AB (ref <0.031)
TROPONIN I: 0.06 ng/mL — AB (ref <0.031)
TROPONIN I: 0.05 ng/mL — AB (ref <0.031)

## 2015-04-25 LAB — URINE MICROSCOPIC-ADD ON
RBC / HPF: NONE SEEN RBC/hpf (ref 0–5)
SQUAMOUS EPITHELIAL / LPF: NONE SEEN
WBC, UA: NONE SEEN WBC/hpf (ref 0–5)

## 2015-04-25 LAB — CBC WITH DIFFERENTIAL/PLATELET
BASOS PCT: 0 %
Basophils Absolute: 0 10*3/uL (ref 0.0–0.1)
EOS ABS: 0.3 10*3/uL (ref 0.0–0.7)
EOS PCT: 4 %
HCT: 39.1 % (ref 39.0–52.0)
HEMOGLOBIN: 12.4 g/dL — AB (ref 13.0–17.0)
Lymphocytes Relative: 12 %
Lymphs Abs: 0.9 10*3/uL (ref 0.7–4.0)
MCH: 20.5 pg — AB (ref 26.0–34.0)
MCHC: 31.7 g/dL (ref 30.0–36.0)
MCV: 64.6 fL — ABNORMAL LOW (ref 78.0–100.0)
MONO ABS: 1 10*3/uL (ref 0.1–1.0)
MONOS PCT: 13 %
Neutro Abs: 5.6 10*3/uL (ref 1.7–7.7)
Neutrophils Relative %: 71 %
PLATELETS: 299 10*3/uL (ref 150–400)
RBC: 6.05 MIL/uL — ABNORMAL HIGH (ref 4.22–5.81)
RDW: 21.9 % — AB (ref 11.5–15.5)
WBC: 7.9 10*3/uL (ref 4.0–10.5)

## 2015-04-25 MED ORDER — HYDRALAZINE HCL 50 MG PO TABS
100.0000 mg | ORAL_TABLET | Freq: Three times a day (TID) | ORAL | Status: DC
  Administered 2015-04-25 – 2015-04-26 (×2): 100 mg via ORAL
  Filled 2015-04-25 (×3): qty 2

## 2015-04-25 MED ORDER — ACETAMINOPHEN 325 MG PO TABS: 650.0000 mg | ORAL_TABLET | ORAL | Status: DC | PRN

## 2015-04-25 MED ORDER — ASPIRIN 81 MG PO CHEW
324.0000 mg | CHEWABLE_TABLET | Freq: Once | ORAL | Status: AC
  Administered 2015-04-25: 324 mg via ORAL
  Filled 2015-04-25: qty 4

## 2015-04-25 MED ORDER — ONDANSETRON HCL 4 MG/2ML IJ SOLN: 4.0000 mg | Freq: Four times a day (QID) | INTRAMUSCULAR | Status: DC | PRN

## 2015-04-25 MED ORDER — HEPARIN SODIUM (PORCINE) 5000 UNIT/ML IJ SOLN
5000.0000 [IU] | Freq: Three times a day (TID) | INTRAMUSCULAR | Status: DC
  Administered 2015-04-25 – 2015-04-26 (×2): 5000 [IU] via SUBCUTANEOUS
  Filled 2015-04-25 (×2): qty 1

## 2015-04-25 MED ORDER — TORSEMIDE 20 MG PO TABS
40.0000 mg | ORAL_TABLET | Freq: Every day | ORAL | Status: DC
  Administered 2015-04-26: 40 mg via ORAL
  Filled 2015-04-25: qty 2

## 2015-04-25 MED ORDER — CARVEDILOL 25 MG PO TABS
25.0000 mg | ORAL_TABLET | Freq: Two times a day (BID) | ORAL | Status: DC
  Administered 2015-04-26: 25 mg via ORAL
  Filled 2015-04-25 (×2): qty 1
  Filled 2015-04-25: qty 2

## 2015-04-25 MED ORDER — CARVEDILOL 25 MG PO TABS: 25.0000 mg | ORAL_TABLET | Freq: Two times a day (BID) | ORAL | Status: DC

## 2015-04-25 MED ORDER — SPIRONOLACTONE 25 MG PO TABS
25.0000 mg | ORAL_TABLET | Freq: Every day | ORAL | Status: DC
  Administered 2015-04-26: 25 mg via ORAL
  Filled 2015-04-25: qty 1

## 2015-04-25 MED ORDER — SODIUM CHLORIDE 0.9 % IV SOLN
INTRAVENOUS | Status: DC
  Administered 2015-04-25 – 2015-04-26 (×2): via INTRAVENOUS

## 2015-04-25 MED ORDER — NITROGLYCERIN 2 % TD OINT
1.0000 [in_us] | TOPICAL_OINTMENT | Freq: Once | TRANSDERMAL | Status: DC
  Filled 2015-04-25: qty 1

## 2015-04-25 MED ORDER — HYDRALAZINE HCL 20 MG/ML IJ SOLN
10.0000 mg | Freq: Once | INTRAMUSCULAR | Status: AC
  Administered 2015-04-25: 10 mg via INTRAVENOUS
  Filled 2015-04-25: qty 1

## 2015-04-25 NOTE — ED Provider Notes (Signed)
CSN: [id number]     Arrival date & time 04/25/15  1320 History  By signing my name below, I, [facility], attest that this documentation has been prepared under the direction and in the presence of Charlesetta Shanks, MD. Electronically Signed: Virgel Bouquet, ED Scribe. 04/25/2015. 7:57 PM.   Chief Complaint  Patient presents with  . Dizziness  . Diarrhea   The history is provided by the patient. No language interpreter was used.  HPI Comments: Frank Coffey is a 35 y.o. male who presents to the Emergency Department complaining of intermittent, mild dizziness that first occurred 9 hours ago. Patient reports dizziness that he describes as lightheadedness upon standing that began this morning. He had three large BMs (diarrhea) following this episode of dizziness. He has taken [place] and drank water yesterday without relief. He has been drinking less today. Dizziness completely resolves while laying still or sitting. He notes similar symptoms in the past that resolved with rest. Denies hx of DM. Denies illicit drug abuse, cigarette smoking, and ETOH abuse.  Denies SOB, CP, difficulty urinating, fevers, nausea, vomiting, LOC.  Past Medical History  Diagnosis Date  . CHF (congestive heart failure) ([place])   . Hypertensive heart disease with congestive heart failure and stage 3 kidney disease ([place])   . Chronic kidney disease, stage 3, mod decreased GFR   . Gout   . Hyperlipidemia   . Obesity   . Hx of migraine headaches   . Hypertension   . OSA (obstructive sleep apnea)    Past Surgical History  Procedure Laterality Date  . Cholecystectomy    . Retinal detachment repair w/ scleral buckle le      Left   Family History  Problem Relation Age of Onset  . Hypertension Mother   . Hyperlipidemia Mother   . Heart disease Mother   . Diabetes Neg Hx   . Heart attack Neg Hx   . Sudden death Neg Hx   . Hypertension Maternal Grandmother   . Stroke Maternal Grandmother   . Heart disease  Maternal Grandmother   . Hyperlipidemia Maternal Grandmother   . Stroke Maternal Grandfather   . Hypertension Maternal Grandfather   . Heart disease Maternal Grandfather   . Hyperlipidemia Maternal Grandfather   . Alzheimer's disease Maternal Grandfather   . Cancer - Other Paternal Grandmother   . Healthy Brother     x2  . Healthy Sister     x2  . Healthy Son     x2  . Healthy Daughter     x1   Social History  Substance Use Topics  . Smoking status: Never Smoker   . Smokeless tobacco: Never Used  . Alcohol Use: Yes     Comment: socially -- once every few months    Review of Systems A complete 10 system review of systems was obtained and all systems are negative except as noted in the HPI and PMH.   Allergies  Review of patient's allergies indicates no known allergies.  Home Medications   Prior to Admission medications   Medication Sig Start Date End Date Taking? Authorizing Provider  carvedilol (COREG) 25 MG tablet Take 1 tablet (25 mg total) by mouth 2 (two) times daily with a meal. 01/02/14  Yes Brunetta Jeans, PA-C  EQ FIBER SUPPLEMENT PO Take by mouth daily.   Yes Historical Provider, MD  hydrALAZINE (APRESOLINE) 100 MG tablet TAKE 1 TABLET BY MOUTH THREE TIMES DAILY 02/09/15  Yes Brunetta Jeans, PA-C  LINZESS 145 MCG CAPS capsule TAKE 1 CAPSULE BY MOUTH EVERY DAY 10/21/14  Yes Brunetta Jeans, PA-C  NON FORMULARY Take 2 capsules by mouth daily. Colon Clenz Herbal   Yes Historical Provider, MD  olmesartan (BENICAR) 40 MG tablet Take 1 tablet (40 mg total) by mouth daily. 01/02/14  Yes Brunetta Jeans, PA-C  Potassium Chloride ER 20 MEQ TBCR Take 20 mEq by mouth daily. 01/02/14  Yes Brunetta Jeans, PA-C  spironolactone (ALDACTONE) 25 MG tablet Take 1 tablet (25 mg total) by mouth daily. 01/02/14  Yes Brunetta Jeans, PA-C  torsemide (DEMADEX) 20 MG tablet TAKE 2 TABLETS( 40 MG TOTAL) BY MOUTH DAILY 02/15/15  Yes Brunetta Jeans, PA-C  allopurinol (ZYLOPRIM) 100  MG tablet Take 1 tablet (100 mg total) by mouth daily. 01/02/14   Brunetta Jeans, PA-C  valACYclovir (VALTREX) 1000 MG tablet Take 1,000 mg by mouth 3 (three) times daily. 11/26/14   Historical Provider, MD   BP 148/96 mmHg  Pulse 84  Temp(Src) 98.5 F (36.9 C)  Resp 18  Ht 5\' 8"  (1.727 m)  Wt 385 lb (174.635 kg)  BMI 58.55 kg/m2  SpO2 98% Physical Exam  Constitutional: He is oriented to person, place, and time. He appears well-developed and well-nourished. No distress.  Morbidly obsese. Non-distressed. Non-toxic appearing.  HENT:  Head: Normocephalic and atraumatic.  Right Ear: Tympanic membrane normal.  Left Ear: Tympanic membrane normal.  Mouth/Throat: Oropharynx is clear and moist.  Posterior oropharnyx patent without exudate or erythema.  Eyes: Conjunctivae and EOM are normal.  Neck: Neck supple. No tracheal deviation present.  Cardiovascular: Normal rate, regular rhythm and normal heart sounds.  Exam reveals no gallop and no friction rub.   No murmur heard. Pulmonary/Chest: Effort normal and breath sounds normal. No respiratory distress. He has no wheezes. He has no rales. He exhibits no tenderness.  Abdominal: Soft. Bowel sounds are normal. He exhibits no distension and no mass. There is no tenderness. There is no rebound and no guarding.  Musculoskeletal: Normal range of motion. He exhibits no edema or tenderness.  No peripheral edema. Calves soft and non-tender.  Neurological: He is alert and oriented to person, place, and time.  Skin: Skin is warm and dry.  Psychiatric: He has a normal mood and affect. His behavior is normal.  Nursing note and vitals reviewed.   ED Course  Procedures   DIAGNOSTIC STUDIES: Oxygen Saturation is 100% on RA, normal by my interpretation.    COORDINATION OF CARE: 3:08 PM Will order labs, IV fluids, and EKG. Discussed treatment plan with pt at bedside and pt agreed to plan.   Labs Review Labs Reviewed  COMPREHENSIVE METABOLIC PANEL  - Abnormal; Notable for the following:    Potassium 3.3 (*)    Glucose, Bld 100 (*)    BUN 24 (*)    Creatinine, Ser 3.26 (*)    Calcium 8.8 (*)    GFR calc non Af Amer 23 (*)    GFR calc Af Amer 26 (*)    Anion gap 4 (*)    All other components within normal limits  TROPONIN I - Abnormal; Notable for the following:    Troponin I 0.05 (*)    All other components within normal limits  CBC WITH DIFFERENTIAL/PLATELET - Abnormal; Notable for the following:    RBC 6.05 (*)    Hemoglobin 12.4 (*)    MCV 64.6 (*)    MCH 20.5 (*)    RDW  21.9 (*)    All other components within normal limits  URINALYSIS, ROUTINE W REFLEX MICROSCOPIC (NOT AT [facility]) - Abnormal; Notable for the following:    Protein, ur 30 (*)    All other components within normal limits  URINE MICROSCOPIC-ADD ON - Abnormal; Notable for the following:    Bacteria, UA RARE (*)    Casts HYALINE CASTS (*)    All other components within normal limits  TROPONIN I - Abnormal; Notable for the following:    Troponin I 0.06 (*)    All other components within normal limits  LIPASE, BLOOD  BRAIN NATRIURETIC PEPTIDE    Imaging Review Dg Chest 2 View  04/25/2015  CLINICAL DATA:  Patient with dizziness and lightheadedness. EXAM: CHEST  2 VIEW COMPARISON:  Chest radiograph 11/16/2014 FINDINGS: Monitoring leads overlie the patient. Stable cardiomegaly. Low lung volumes. Minimal heterogeneous opacities bilateral lung bases. No large area of pulmonary consolidation. No pleural effusion or pneumothorax. Regional skeleton is unremarkable. IMPRESSION: Basilar opacities favored to represent atelectasis. Cardiomegaly. Electronically Signed   By: Lovey Newcomer M.D.   On: 04/25/2015 18:14   I have personally reviewed and evaluated these images and lab results as part of my medical decision-making.   EKG Interpretation   Date/Time:  Sunday April 25 2015 15:27:53 EDT Ventricular Rate:  84 PR Interval:  194 QRS Duration: 115 QT Interval:  401 QTC  Calculation: 474 R Axis:   21 Text Interpretation:  Sinus rhythm Probable left atrial enlargement  Incomplete left bundle branch block lateral t wave inversion more  pronaounced Confirmed by Johnney Killian, MD, Jeannie Done [phone]) on 04/25/2015 5:27:49  PM     Consult: (17:45) reviewed with Dr.Azime Cardiology. Do not start anticoag treatment for ACS at this time. Admit to hospitalist and place consult to him. MDM   Final diagnoses:  Lightheadedness  Renovascular hypertension  Troponin I above reference range   Patient presents with lightheadedness. He has comorbid chronic renal insufficiency and history of cardiomyopathy. Patient denies any associated chest pain or shortness of breath. Patient is not orthostatic. Renal function is near baseline. At this time, patient has mild elevation in troponin and dynamic EKG changes without acute MI. Consultation to cardiology has been made. At this time plan will be for admission to hospitalist service for rule out MI and continued monitoring with cardiology consultation.   Charlesetta Shanks, MD 04/25/15 2000

## 2015-04-25 NOTE — Progress Notes (Signed)
Patient with a history of dilated cardiomyopathy. Patient came with dizziness upon standing but orthostatics were WNL. Trop was 0.05. EKG with dynamic T wave inversions. Cardio recommended admission for ACS r/o. Will admit to tele bed, trend trops, get Echo, keep NPO after MN. VS stable. Patient got asp.

## 2015-04-25 NOTE — ED Notes (Signed)
[facility] here at this time. 

## 2015-04-25 NOTE — H&P (Signed)
[facility] History and Physical  Frank Coffey [id number] DOB: 11-02-1980 DOA: 04/25/2015  Referring physician: EDP PCP: Leeanne Rio, PA-C   Chief Complaint: Dizziness   HPI: Frank Coffey is a 35 y.o. 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 Jimmy Footman.  Patient presents to the ED at [facility] with c/o dizziness on standing.  Work up in ED shows lateral T wave inversions, troponin of 0.05.  Patient transferred to [facility] for ischemic evaluation.  Review of Systems: Systems reviewed.  As above, otherwise negative  Past Medical History  Diagnosis Date  . CHF (congestive heart failure) ([place])   . 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 Chronic kidney disease, stage 3, mod decreased GFR   . Gout   . Hyperlipidemia   . Obesity   . Hx of migraine headaches   . Hypertension   . OSA (obstructive sleep apnea)    Past Surgical History  Procedure Laterality Date  . Cholecystectomy    . Retinal detachment repair w/ scleral buckle le      Left   Social History:  reports that he has never smoked. He has never used smokeless tobacco. He reports that he drinks alcohol. He reports that he does not use illicit drugs.  No Known Allergies  Family History  Problem Relation Age of Onset  . Hypertension Mother   . Hyperlipidemia Mother   . Heart disease Mother   . Diabetes Neg Hx   . Heart attack Neg Hx   . Sudden death Neg Hx   . Hypertension Maternal Grandmother   . Stroke Maternal Grandmother   . Heart disease Maternal Grandmother   . Hyperlipidemia Maternal Grandmother   . Stroke Maternal Grandfather   . Hypertension Maternal Grandfather   . Heart disease Maternal Grandfather   . Hyperlipidemia Maternal Grandfather   . Alzheimer's disease Maternal Grandfather   . Cancer - Other Paternal Grandmother   . Healthy Brother     x2  . Healthy Sister     x2  . Healthy Son     x2  .  Healthy Daughter     x1     Prior to Admission medications   Medication Sig Start Date End Date Taking? Authorizing Provider  carvedilol (COREG) 25 MG tablet Take 1 tablet (25 mg total) by mouth 2 (two) times daily with a meal. 01/02/14  Yes Brunetta Jeans, PA-C  EQ FIBER SUPPLEMENT PO Take by mouth daily.   Yes Historical Provider, MD  hydrALAZINE (APRESOLINE) 100 MG tablet TAKE 1 TABLET BY MOUTH THREE TIMES DAILY 02/09/15  Yes Brunetta Jeans, PA-C  LINZESS 145 MCG CAPS capsule TAKE 1 CAPSULE BY MOUTH EVERY DAY 10/21/14  Yes Brunetta Jeans, PA-C  NON FORMULARY Take 2 capsules by mouth daily. Colon Clenz Herbal   Yes Historical Provider, MD  spironolactone (ALDACTONE) 25 MG tablet Take 1 tablet (25 mg total) by mouth daily. 01/02/14  Yes Brunetta Jeans, PA-C  torsemide (DEMADEX) 20 MG tablet TAKE 2 TABLETS( 40 MG TOTAL) BY MOUTH DAILY 02/15/15  Yes Brunetta Jeans, PA-C  allopurinol (ZYLOPRIM) 100 MG tablet Take 1 tablet (100 mg total) by mouth daily. 01/02/14   Brunetta Jeans, PA-C  valACYclovir (VALTREX) 1000 MG tablet Take 1,000 mg by mouth 3 (three) times daily. 11/26/14   Historical Provider, MD   Physical Exam: Filed Vitals:   04/25/15 1949  04/25/15 2047  BP: 148/96 146/104  Pulse: 84 86  Temp: 98.5 F (36.9 C) 98.9 F (37.2 C)  Resp: 18 18    BP 146/104 mmHg  Pulse 86  Temp(Src) 98.9 F (37.2 C) (Oral)  Resp 18  Ht 5\' 8"  (1.727 m)  Wt 175.27 kg (386 lb 6.4 oz)  BMI 58.77 kg/m2  SpO2 94%  General Appearance:    Alert, oriented, no distress, appears stated age  Head:    Normocephalic, atraumatic  Eyes:    PERRL, EOMI, sclera non-icteric        Nose:   Nares without drainage or epistaxis. Mucosa, turbinates normal  Throat:   Moist mucous membranes. Oropharynx without erythema or exudate.  Neck:   Supple. No carotid bruits.  No thyromegaly.  No lymphadenopathy.   Back:     No CVA tenderness, no spinal tenderness  Lungs:     Clear to auscultation bilaterally,  without wheezes, rhonchi or rales  Chest wall:    No tenderness to palpitation  Heart:    Regular rate and rhythm without murmurs, gallops, rubs  Abdomen:     Soft, non-tender, nondistended, normal bowel sounds, no organomegaly  Genitalia:    deferred  Rectal:    deferred  Extremities:   No clubbing, cyanosis or edema.  Pulses:   2+ and symmetric all extremities  Skin:   Skin color, texture, turgor normal, no rashes or lesions  Lymph nodes:   Cervical, supraclavicular, and axillary nodes normal  Neurologic:   CNII-XII intact. Normal strength, sensation and reflexes      throughout    Labs on Admission:  Basic Metabolic Panel:  Recent Labs Lab 04/25/15 1550  NA 137  K 3.3*  CL 102  CO2 31  GLUCOSE 100*  BUN 24*  CREATININE 3.26*  CALCIUM 8.8*   Liver Function Tests:  Recent Labs Lab 04/25/15 1550  AST 34  ALT 30  ALKPHOS 84  BILITOT 0.6  PROT 7.6  ALBUMIN 3.5    Recent Labs Lab 04/25/15 1550  LIPASE 22   No results for input(s): AMMONIA in the last 168 hours. CBC:  Recent Labs Lab 04/25/15 1550  WBC 7.9  NEUTROABS 5.6  HGB 12.4*  HCT 39.1  MCV 64.6*  PLT 299   Cardiac Enzymes:  Recent Labs Lab 04/25/15 1550 04/25/15 1915  TROPONINI 0.05* 0.06*    BNP (last 3 results) No results for input(s): PROBNP in the last 8760 hours. CBG: No results for input(s): GLUCAP in the last 168 hours.  Radiological Exams on Admission: Dg Chest 2 View  04/25/2015  CLINICAL DATA:  Patient with dizziness and lightheadedness. EXAM: CHEST  2 VIEW COMPARISON:  Chest radiograph 11/16/2014 FINDINGS: Monitoring leads overlie the patient. Stable cardiomegaly. Low lung volumes. Minimal heterogeneous opacities bilateral lung bases. No large area of pulmonary consolidation. No pleural effusion or pneumothorax. Regional skeleton is unremarkable. IMPRESSION: Basilar opacities favored to represent atelectasis. Cardiomegaly. Electronically Signed   By: Lovey Newcomer M.D.   On:  04/25/2015 18:14    EKG: Independently reviewed.  Assessment/Plan Principal Problem:   Dizziness Active Problems:   Essential hypertension, benign   CKD (chronic kidney disease) stage 4, GFR 15-29 ml/min (HCC)   1. Dizziness - 1. Given EKG changes and mild troponin, plan is to call cards tomorrow in AM 2. Chest pain obs pathway 3. Serial trops 2. HTN - continue home meds 3. CKD stage 4 - baseline and stable creatinine    Code Status:  Full  Family Communication: Wife at bedside Disposition Plan: Admit to obs   Time spent: 50 min  Jayce Kainz M. [facility] Pager [phone]  If 7AM-7PM, please contact the day team taking care of the patient [email or web address] Password [email or web address] 04/25/2015, 9:49 PM

## 2015-04-25 NOTE — ED Notes (Signed)
Pt reports that he had been constipated for a a few days, last week he took meds to get bowels moving, pt has now had multiple loose bms since Friday, now feels dehydrated and dizzy

## 2015-04-25 NOTE — ED Notes (Signed)
Attempted to call report at 2010. RN unavailable.

## 2015-04-25 NOTE — Consult Note (Signed)
Referring Physician: Dr. Johnney Killian at [facility]   Primary Cardiologist: Dr. Skeet Latch  Reason for Consultation: Dizziness  HPI: 35 yo obese AA man with history of dilated cardiomyopathy, systolic HF, last LVEF [id number] by echo on 12/09/2014, CKD-3/4, OSA/CPAP use, presented to the ER for evaluation of dizziness in the upright position this am. He reports that he was constipated for two days and was prescribed Linaclotide which resulted in few loose bowel movements. He felt dehydrated therefore increased his fluid intake. Denies fever, N/V, CP, SOB, orthopnea, PND, syncope, bleeding, cough, palpitations, diaphoresis. He was concerned so he presented to [facility] where BNP 39, Trop I 0.05 (was 0.17 nine months ago), Cr 3.2. ECG showed sinus rhythm and lateral T inversion unchanged from 12/01/2014. Orthostatic parameters were reportedly negative.     Review of Systems:  10 systems reviewed unremarkable except as noted in HPI    Past Medical History  Diagnosis Date  . CHF (congestive heart failure) ([place])   . 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 Chronic kidney disease, stage 3, mod decreased GFR   . Gout   . Hyperlipidemia   . Obesity   . Hx of migraine headaches   . Hypertension   . OSA (obstructive sleep apnea)     Medications Prior to Admission  Medication Sig Dispense Refill  . carvedilol (COREG) 25 MG tablet Take 1 tablet (25 mg total) by mouth 2 (two) times daily with a meal. 180 tablet 1  . EQ FIBER SUPPLEMENT PO Take by mouth daily.    . hydrALAZINE (APRESOLINE) 100 MG tablet TAKE 1 TABLET BY MOUTH THREE TIMES DAILY 90 tablet 0  . LINZESS 145 MCG CAPS capsule TAKE 1 CAPSULE BY MOUTH EVERY DAY 30 capsule 2  . NON FORMULARY Take 2 capsules by mouth daily. Colon Clenz Herbal    . spironolactone (ALDACTONE) 25 MG tablet Take 1 tablet (25 mg total) by mouth daily. 90 tablet 1  . torsemide  (DEMADEX) 20 MG tablet TAKE 2 TABLETS( 40 MG TOTAL) BY MOUTH DAILY 60 tablet 2  . [DISCONTINUED] olmesartan (BENICAR) 40 MG tablet Take 1 tablet (40 mg total) by mouth daily. 90 tablet 1  . [DISCONTINUED] Potassium Chloride ER 20 MEQ TBCR Take 20 mEq by mouth daily. 60 tablet 2  . allopurinol (ZYLOPRIM) 100 MG tablet Take 1 tablet (100 mg total) by mouth daily. 30 tablet 3  . valACYclovir (VALTREX) 1000 MG tablet Take 1,000 mg by mouth 3 (three) times daily.       . carvedilol  25 mg Oral BID WC  . heparin  5,000 Units Subcutaneous 3 times per day  . hydrALAZINE  100 mg Oral TID  . nitroGLYCERIN  1 inch Topical Once  . [START ON 04/26/2015] spironolactone  25 mg Oral Daily  . [START ON 04/26/2015] torsemide  40 mg Oral Daily    Infusions: . sodium chloride 100 mL/hr at 04/25/15 1550    No Known Allergies  Social History   Social History  . Marital Status: Married    Spouse Name: N/A  . Number of Children: N/A  . Years of Education: N/A   Occupational History  . Not on file.   Social History Main Topics  . Smoking status: Never Smoker   . Smokeless tobacco: Never Used  . Alcohol Use: Yes     Comment: socially -- once every few months  .  Drug Use: No  . Sexual Activity: Not on file   Other Topics Concern  . Not on file   Social History Narrative   Epworth Sleepiness Scale = 10 (as of 12/01/14)    Family History  Problem Relation Age of Onset  . Hypertension Mother   . Hyperlipidemia Mother   . Heart disease Mother   . Diabetes Neg Hx   . Heart attack Neg Hx   . Sudden death Neg Hx   . Hypertension Maternal Grandmother   . Stroke Maternal Grandmother   . Heart disease Maternal Grandmother   . Hyperlipidemia Maternal Grandmother   . Stroke Maternal Grandfather   . Hypertension Maternal Grandfather   . Heart disease Maternal Grandfather   . Hyperlipidemia Maternal Grandfather   . Alzheimer's disease Maternal Grandfather   . Cancer - Other Paternal Grandmother    . Healthy Brother     x2  . Healthy Sister     x2  . Healthy Son     x2  . Healthy Daughter     x1    PHYSICAL EXAM: Filed Vitals:   04/25/15 1949 04/25/15 2047  BP: 148/96 146/104  Pulse: 84 86  Temp: 98.5 F (36.9 C) 98.9 F (37.2 C)  Resp: 18 18     Intake/Output Summary (Last 24 hours) at 04/25/15 2227 Last data filed at 04/25/15 1546  Gross per 24 hour  Intake      0 ml  Output    550 ml  Net   -550 ml    General:  Obese, No respiratory difficulty HEENT: normal Neck: supple. no JVD. Carotids 2+ bilat; no bruits. No lymphadenopathy or thryomegaly appreciated. Cor: PMI nondisplaced. Regular rate & rhythm. No rubs, gallops or murmurs. Lungs: clear Abdomen: soft, nontender, nondistended. No hepatosplenomegaly. No bruits or masses. Good bowel sounds. Extremities: no cyanosis, clubbing, rash, edema Neuro: alert & oriented x 3, cranial nerves grossly intact. moves all 4 extremities w/o difficulty. Affect pleasant.   Results for orders placed or performed during the hospital encounter of 04/25/15 (from the past 24 hour(s))  Urinalysis, Routine w reflex microscopic     Status: Abnormal   Collection Time: 04/25/15  3:40 PM  Result Value Ref Range   Color, Urine YELLOW YELLOW   APPearance CLEAR CLEAR   Specific Gravity, Urine 1.012 1.005 - 1.030   pH 5.5 5.0 - 8.0   Glucose, UA NEGATIVE NEGATIVE mg/dL   Hgb urine dipstick NEGATIVE NEGATIVE   Bilirubin Urine NEGATIVE NEGATIVE   Ketones, ur NEGATIVE NEGATIVE mg/dL   Protein, ur 30 (A) NEGATIVE mg/dL   Nitrite NEGATIVE NEGATIVE   Leukocytes, UA NEGATIVE NEGATIVE  Urine microscopic-add on     Status: Abnormal   Collection Time: 04/25/15  3:40 PM  Result Value Ref Range   Squamous Epithelial / LPF NONE SEEN NONE SEEN   WBC, UA NONE SEEN 0 - 5 WBC/hpf   RBC / HPF NONE SEEN 0 - 5 RBC/hpf   Bacteria, UA RARE (A) NONE SEEN   Casts HYALINE CASTS (A) NEGATIVE   Urine-Other MUCOUS PRESENT   Comprehensive metabolic  panel     Status: Abnormal   Collection Time: 04/25/15  3:50 PM  Result Value Ref Range   Sodium 137 135 - 145 mmol/L   Potassium 3.3 (L) 3.5 - 5.1 mmol/L   Chloride 102 101 - 111 mmol/L   CO2 31 22 - 32 mmol/L   Glucose, Bld 100 (H) 65 - 99 mg/dL  BUN 24 (H) 6 - 20 mg/dL   Creatinine, Ser 3.26 (H) 0.61 - 1.24 mg/dL   Calcium 8.8 (L) 8.9 - 10.3 mg/dL   Total Protein 7.6 6.5 - 8.1 g/dL   Albumin 3.5 3.5 - 5.0 g/dL   AST 34 15 - 41 U/L   ALT 30 17 - 63 U/L   Alkaline Phosphatase 84 38 - 126 U/L   Total Bilirubin 0.6 0.3 - 1.2 mg/dL   GFR calc non Af Amer 23 (L) >60 mL/min   GFR calc Af Amer 26 (L) >60 mL/min   Anion gap 4 (L) 5 - 15  Lipase, blood     Status: None   Collection Time: 04/25/15  3:50 PM  Result Value Ref Range   Lipase 22 11 - 51 U/L  Troponin I     Status: Abnormal   Collection Time: 04/25/15  3:50 PM  Result Value Ref Range   Troponin I 0.05 (H) <0.031 ng/mL  CBC with Differential     Status: Abnormal   Collection Time: 04/25/15  3:50 PM  Result Value Ref Range   WBC 7.9 4.0 - 10.5 K/uL   RBC 6.05 (H) 4.22 - 5.81 MIL/uL   Hemoglobin 12.4 (L) 13.0 - 17.0 g/dL   HCT 39.1 39.0 - 52.0 %   MCV 64.6 (L) 78.0 - 100.0 fL   MCH 20.5 (L) 26.0 - 34.0 pg   MCHC 31.7 30.0 - 36.0 g/dL   RDW 21.9 (H) 11.5 - 15.5 %   Platelets 299 150 - 400 K/uL   Neutrophils Relative % 71 %   Neutro Abs 5.6 1.7 - 7.7 K/uL   Lymphocytes Relative 12 %   Lymphs Abs 0.9 0.7 - 4.0 K/uL   Monocytes Relative 13 %   Monocytes Absolute 1.0 0.1 - 1.0 K/uL   Eosinophils Relative 4 %   Eosinophils Absolute 0.3 0.0 - 0.7 K/uL   Basophils Relative 0 %   Basophils Absolute 0.0 0.0 - 0.1 K/uL   RBC Morphology ELLIPTOCYTES   Brain natriuretic peptide     Status: None   Collection Time: 04/25/15  3:50 PM  Result Value Ref Range   B Natriuretic Peptide 39.1 0.0 - 100.0 pg/mL  Troponin I     Status: Abnormal   Collection Time: 04/25/15  7:15 PM  Result Value Ref Range   Troponin I 0.06 (H)  <0.031 ng/mL   Dg Chest 2 View  04/25/2015  CLINICAL DATA:  Patient with dizziness and lightheadedness. EXAM: CHEST  2 VIEW COMPARISON:  Chest radiograph 11/16/2014 FINDINGS: Monitoring leads overlie the patient. Stable cardiomegaly. Low lung volumes. Minimal heterogeneous opacities bilateral lung bases. No large area of pulmonary consolidation. No pleural effusion or pneumothorax. Regional skeleton is unremarkable. IMPRESSION: Basilar opacities favored to represent atelectasis. Cardiomegaly. Electronically Signed   By: Lovey Newcomer M.D.   On: 04/25/2015 18:14     ASSESSMENT:  1. Postural dizziness this am - now resolved - suspect due to orthostasis as a result of few loose bowel movements; resolved by increasing his fluid intake - No evidence to suggest ACS or acute HF  2. Non ischemic CM, chronic systolic HF - being followed by Dr. Oval Linsey  - Continue HF regimen - Appears well perfused and euvolemic at this time    PLAN/DISCUSSION:  No further cardiac work up is warranted at this time  Thanks for the consult Please call if questions.   Wandra Mannan, MD Cardiology

## 2015-04-25 NOTE — ED Notes (Signed)
Pt placed on automatic VS, continuous pulse ox and cardiac monitoring.

## 2015-04-26 DIAGNOSIS — R42 Dizziness and giddiness: Secondary | ICD-10-CM | POA: Diagnosis not present

## 2015-04-26 LAB — TROPONIN I
TROPONIN I: 0.06 ng/mL — AB (ref <0.031)
Troponin I: 0.05 ng/mL — ABNORMAL HIGH (ref <0.031)

## 2015-04-26 MED ORDER — HYDRALAZINE HCL 20 MG/ML IJ SOLN: 10.0000 mg | INTRAMUSCULAR | Status: DC | PRN

## 2015-04-26 NOTE — Progress Notes (Signed)
SUBJECTIVE:  No dizziness.  Did not sleep last night   PHYSICAL EXAM Filed Vitals:   04/25/15 1900 04/25/15 1949 04/25/15 2047 04/26/15 0402  BP: 149/99 148/96 146/104 152/102  Pulse: 91 84 86 78  Temp:  98.5 F (36.9 C) 98.9 F (37.2 C) 98.1 F (36.7 C)  TempSrc:   Oral Oral  Resp: 17 18 18 18   Height:   5\' 8"  (1.727 m)   Weight:   386 lb 6.4 oz (175.27 kg)   SpO2: 98% 98% 94% 98%   General:  No distress Lungs:  Clear Heart:  RRR Abdomen:  Positive bowel sounds, no rebound no guarding Extremities:  No edema.   LABS: Lab Results  Component Value Date   TROPONINI 0.05* 04/26/2015   Results for orders placed or performed during the hospital encounter of 04/25/15 (from the past 24 hour(s))  Urinalysis, Routine w reflex microscopic     Status: Abnormal   Collection Time: 04/25/15  3:40 PM  Result Value Ref Range   Color, Urine YELLOW YELLOW   APPearance CLEAR CLEAR   Specific Gravity, Urine 1.012 1.005 - 1.030   pH 5.5 5.0 - 8.0   Glucose, UA NEGATIVE NEGATIVE mg/dL   Hgb urine dipstick NEGATIVE NEGATIVE   Bilirubin Urine NEGATIVE NEGATIVE   Ketones, ur NEGATIVE NEGATIVE mg/dL   Protein, ur 30 (A) NEGATIVE mg/dL   Nitrite NEGATIVE NEGATIVE   Leukocytes, UA NEGATIVE NEGATIVE  Urine microscopic-add on     Status: Abnormal   Collection Time: 04/25/15  3:40 PM  Result Value Ref Range   Squamous Epithelial / LPF NONE SEEN NONE SEEN   WBC, UA NONE SEEN 0 - 5 WBC/hpf   RBC / HPF NONE SEEN 0 - 5 RBC/hpf   Bacteria, UA RARE (A) NONE SEEN   Casts HYALINE CASTS (A) NEGATIVE   Urine-Other MUCOUS PRESENT   Comprehensive metabolic panel     Status: Abnormal   Collection Time: 04/25/15  3:50 PM  Result Value Ref Range   Sodium 137 135 - 145 mmol/L   Potassium 3.3 (L) 3.5 - 5.1 mmol/L   Chloride 102 101 - 111 mmol/L   CO2 31 22 - 32 mmol/L   Glucose, Bld 100 (H) 65 - 99 mg/dL   BUN 24 (H) 6 - 20 mg/dL   Creatinine, Ser 3.26 (H) 0.61 - 1.24 mg/dL   Calcium 8.8 (L) 8.9  - 10.3 mg/dL   Total Protein 7.6 6.5 - 8.1 g/dL   Albumin 3.5 3.5 - 5.0 g/dL   AST 34 15 - 41 U/L   ALT 30 17 - 63 U/L   Alkaline Phosphatase 84 38 - 126 U/L   Total Bilirubin 0.6 0.3 - 1.2 mg/dL   GFR calc non Af Amer 23 (L) >60 mL/min   GFR calc Af Amer 26 (L) >60 mL/min   Anion gap 4 (L) 5 - 15  Lipase, blood     Status: None   Collection Time: 04/25/15  3:50 PM  Result Value Ref Range   Lipase 22 11 - 51 U/L  Troponin I     Status: Abnormal   Collection Time: 04/25/15  3:50 PM  Result Value Ref Range   Troponin I 0.05 (H) <0.031 ng/mL  CBC with Differential     Status: Abnormal   Collection Time: 04/25/15  3:50 PM  Result Value Ref Range   WBC 7.9 4.0 - 10.5 K/uL   RBC 6.05 (H) 4.22 - 5.81 MIL/uL  Hemoglobin 12.4 (L) 13.0 - 17.0 g/dL   HCT 39.1 39.0 - 52.0 %   MCV 64.6 (L) 78.0 - 100.0 fL   MCH 20.5 (L) 26.0 - 34.0 pg   MCHC 31.7 30.0 - 36.0 g/dL   RDW 21.9 (H) 11.5 - 15.5 %   Platelets 299 150 - 400 K/uL   Neutrophils Relative % 71 %   Neutro Abs 5.6 1.7 - 7.7 K/uL   Lymphocytes Relative 12 %   Lymphs Abs 0.9 0.7 - 4.0 K/uL   Monocytes Relative 13 %   Monocytes Absolute 1.0 0.1 - 1.0 K/uL   Eosinophils Relative 4 %   Eosinophils Absolute 0.3 0.0 - 0.7 K/uL   Basophils Relative 0 %   Basophils Absolute 0.0 0.0 - 0.1 K/uL   RBC Morphology ELLIPTOCYTES   Brain natriuretic peptide     Status: None   Collection Time: 04/25/15  3:50 PM  Result Value Ref Range   B Natriuretic Peptide 39.1 0.0 - 100.0 pg/mL  Troponin I     Status: Abnormal   Collection Time: 04/25/15  7:15 PM  Result Value Ref Range   Troponin I 0.06 (H) <0.031 ng/mL  Troponin I-serum (0, 3, 6 hours)     Status: Abnormal   Collection Time: 04/25/15  9:52 PM  Result Value Ref Range   Troponin I 0.05 (H) <0.031 ng/mL  Troponin I-serum (0, 3, 6 hours)     Status: Abnormal   Collection Time: 04/26/15 12:19 AM  Result Value Ref Range   Troponin I 0.06 (H) <0.031 ng/mL  Troponin I-serum (0, 3, 6  hours)     Status: Abnormal   Collection Time: 04/26/15  3:25 AM  Result Value Ref Range   Troponin I 0.05 (H) <0.031 ng/mL    Intake/Output Summary (Last 24 hours) at 04/26/15 0758 Last data filed at 04/26/15 0100  Gross per 24 hour  Intake    444 ml  Output    550 ml  Net   -106 ml    TELE:     NSR with blocked PACs.   ASSESSMENT AND PLAN:   DIZZINESS:   No longer dizzy.  No symptomatic arrhythmias on tele.  No further work up.    CHRONIC SYSTOLIC AND DIASTOLIC HF:   Euvolemic.  No further work up.  Continue current meds.     Jeneen Rinks [facility] 04/26/2015 7:58 AM

## 2015-04-26 NOTE — Discharge Instructions (Signed)
Follow with Primary MD Leeanne Rio, PA-C in 7 days   Get CBC, CMP, 2 view Chest X ray checked  by Primary MD next visit.    Activity: As tolerated with Full fall precautions use walker/cane & assistance as needed   Disposition Home     Diet:   Heart Healthy  Check your Weight same time everyday, if you gain over 2 pounds, or you develop in leg swelling, experience more shortness of breath or chest pain, call your Primary MD immediately. Follow Cardiac Low Salt Diet and 1.5 lit/day fluid restriction.   On your next visit with your primary care physician please Get Medicines reviewed and adjusted.   Please request your Prim.MD to go over all Hospital Tests and Procedure/Radiological results at the follow up, please get all Hospital records sent to your Prim MD by signing hospital release before you go home.   If you experience worsening of your admission symptoms, develop shortness of breath, life threatening emergency, suicidal or homicidal thoughts you must seek medical attention immediately by calling 911 or calling your MD immediately  if symptoms less severe.  You Must read complete instructions/literature along with all the possible adverse reactions/side effects for all the Medicines you take and that have been prescribed to you. Take any new Medicines after you have completely understood and accpet all the possible adverse reactions/side effects.   Do not drive, operating heavy machinery, perform activities at heights, swimming or participation in water activities or provide baby sitting services if your were admitted for syncope or siezures until you have seen by Primary MD or a Neurologist and advised to do so again.  Do not drive when taking Pain medications.    Do not take more than prescribed Pain, Sleep and Anxiety Medications  Special Instructions: If you have smoked or chewed Tobacco  in the last 2 yrs please stop smoking, stop any regular Alcohol  and or any  Recreational drug use.  Wear Seat belts while driving.   Please note  You were cared for by a hospitalist during your hospital stay. If you have any questions about your discharge medications or the care you received while you were in the hospital after you are discharged, you can call the unit and asked to speak with the hospitalist on call if the hospitalist that took care of you is not available. Once you are discharged, your primary care physician will handle any further medical issues. Please note that NO REFILLS for any discharge medications will be authorized once you are discharged, as it is imperative that you return to your primary care physician (or establish a relationship with a primary care physician if you do not have one) for your aftercare needs so that they can reassess your need for medications and monitor your lab values.

## 2015-04-26 NOTE — Discharge Summary (Signed)
Frank Coffey, is a 35 y.o. male  DOB Jan 13, 1981  MRN [id number].  Admission date:  04/25/2015  Admitting Physician  Gennaro Africa, MD  Discharge Date:  04/26/2015   Primary MD  Leeanne Rio, PA-C  Recommendations for primary care physician for things to follow:   Monitor weight , BMP and diuretic dose closely. Close outpatient cardiology follow-up   Admission Diagnosis  Lightheadedness [R42] Renovascular hypertension [I15.0] Troponin I above reference range [R79.89]   Discharge Diagnosis  Lightheadedness [R42] Renovascular hypertension [I15.0] Troponin I above reference range [R79.89]     Principal Problem:   Dizziness Active Problems:   Essential hypertension, benign   CKD (chronic kidney disease) stage 4, GFR 15-29 ml/min (HCC)      Past Medical History  Diagnosis Date  . CHF (congestive heart failure) ([name])   . 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 Chronic kidney disease, stage 3, mod decreased GFR   . Gout   . Hyperlipidemia   . Obesity   . Hx of migraine headaches   . Hypertension   . OSA (obstructive sleep apnea)     Past Surgical History  Procedure Laterality Date  . Cholecystectomy    . Retinal detachment repair w/ scleral buckle le      Left       HPI  from the history and physical done on the day of admission:    Frank Coffey is a 35 y.o. 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 Jimmy Footman. Patient presents to the ED at [facility] with c/o dizziness on standing. Work up in ED shows lateral T wave inversions, troponin of 0.05. Patient transferred to [facility] for ischemic evaluation.     Hospital Course:     1.Dizziness. Per history appears to be orthostatic in origin, however upon arrival  to the ER he was not orthostatic, EKG was stable, troponin trend was flat and non-ACS pattern, he was chest pain-free this morning, he was seen by cardiologist Dr. Percival Spanish, I discussed the case with him, he requested me to discharge the patient on his home medications completely unchanged. Patient was requested to follow with Dr. Skeet Latch his primary cardiologist within a week and with his PCP within a week to get a repeat BMP, blood pressure check and medication adjustment if needed. His telemetry did not reveal any dysrhythmia.  2. Essential hypertension - continue home medications per cardiology unchanged.   3. Chronic kidney disease stage IV. Baseline creatinine close to 3.1. Close to baseline, no changes in his home medications per cardiology. As the patient to follow with his kidney doctor within 1-2 weeks as well.  4. Chronic combined systolic and diastolic heart failure. EF around 35%. 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 He was compensated and no changes made per cardiology to his home regimen, continue beta blocker, ARB and diuretic per home dose and follow with cardiology.  Follow UP  Follow-up Information    Follow up with Leeanne Rio, PA-C. Schedule an appointment as soon as possible for a visit in 1 week.   Specialty:  Family Medicine   Contact information:   [address] [place] [address] [phone]       Follow up with Sharol Harness, MD. Schedule an appointment as soon as possible for a visit in 1 week.   Specialty:  Cardiology   Contact information:   [address] [place] [address] [phone]       Follow up with DETERDING,JAMES L, MD. Schedule an appointment as soon as possible for a visit in 1 week.   Specialty:  Nephrology   Contact information:   [place] [address] [phone]        Consults obtained - Cards  Discharge Condition:  Stable  Diet and Activity recommendation: See Discharge Instructions below  Discharge Instructions           Discharge Instructions    Discharge instructions    Complete by:  As directed   Follow with Primary MD Leeanne Rio, PA-C in 7 days   Get CBC, CMP, 2 view Chest X ray checked  by Primary MD next visit.    Activity: As tolerated with Full fall precautions use walker/cane & assistance as needed   Disposition Home     Diet:   Heart Healthy  Check your Weight same time everyday, if you gain over 2 pounds, or you develop in leg swelling, experience more shortness of breath or chest pain, call your Primary MD immediately. Follow Cardiac Low Salt Diet and 1.5 lit/day fluid restriction.   On your next visit with your primary care physician please Get Medicines reviewed and adjusted.   Please request your Prim.MD to go over all Hospital Tests and Procedure/Radiological results at the follow up, please get all Hospital records sent to your Prim MD by signing hospital release before you go home.   If you experience worsening of your admission symptoms, develop shortness of breath, life threatening emergency, suicidal or homicidal thoughts you must seek medical attention immediately by calling 911 or calling your MD immediately  if symptoms less severe.  You Must read complete instructions/literature along with all the possible adverse reactions/side effects for all the Medicines you take and that have been prescribed to you. Take any new Medicines after you have completely understood and accpet all the possible adverse reactions/side effects.   Do not drive, operating heavy machinery, perform activities at heights, swimming or participation in water activities or provide baby sitting services if your were admitted for syncope or siezures until you have seen by Primary MD or a Neurologist and advised to do so again.  Do not drive when taking Pain medications.    Do not take  more than prescribed Pain, Sleep and Anxiety Medications  Special Instructions: If you have smoked or chewed Tobacco  in the last 2 yrs please stop smoking, stop any regular Alcohol  and or any Recreational drug use.  Wear Seat belts while driving.   Please note  You were cared for by a hospitalist during your hospital stay. If you have any questions about your discharge medications or the care you received while you were in the hospital after you are discharged, you can call the unit and asked to speak with the hospitalist on call if the hospitalist that took care of you  is not available. Once you are discharged, your primary care physician will handle any further medical issues. Please note that NO REFILLS for any discharge medications will be authorized once you are discharged, as it is imperative that you return to your primary care physician (or establish a relationship with a primary care physician if you do not have one) for your aftercare needs so that they can reassess your need for medications and monitor your lab values.     Increase activity slowly    Complete by:  As directed              Discharge Medications       Medication List    TAKE these medications        allopurinol 100 MG tablet  Commonly known as:  ZYLOPRIM  Take 1 tablet (100 mg total) by mouth daily.     ARTIFICIAL TEARS 0.1-0.3 % Soln  Place 1 drop into the left eye daily as needed (for dry eyes).     carvedilol 25 MG tablet  Commonly known as:  COREG  Take 1 tablet (25 mg total) by mouth 2 (two) times daily with a meal.     COMBIGAN 0.2-0.5 % ophthalmic solution  Generic drug:  brimonidine-timolol  Place 1 drop into the left eye every 12 (twelve) hours.     EQ FIBER SUPPLEMENT PO  Take 1 tablet by mouth daily.     hydrALAZINE 100 MG tablet  Commonly known as:  APRESOLINE  TAKE 1 TABLET BY MOUTH THREE TIMES DAILY     LINZESS 145 MCG Caps capsule  Generic drug:  Linaclotide  TAKE 1 CAPSULE  BY MOUTH EVERY DAY     spironolactone 25 MG tablet  Commonly known as:  ALDACTONE  Take 1 tablet (25 mg total) by mouth daily.     torsemide 20 MG tablet  Commonly known as:  DEMADEX  TAKE 2 TABLETS( 40 MG TOTAL) BY MOUTH DAILY        Major procedures and Radiology Reports - PLEASE review detailed and final reports for all details, in brief -       Dg Chest 2 View  04/25/2015  CLINICAL DATA:  Patient with dizziness and lightheadedness. EXAM: CHEST  2 VIEW COMPARISON:  Chest radiograph 11/16/2014 FINDINGS: Monitoring leads overlie the patient. Stable cardiomegaly. Low lung volumes. Minimal heterogeneous opacities bilateral lung bases. No large area of pulmonary consolidation. No pleural effusion or pneumothorax. Regional skeleton is unremarkable. IMPRESSION: Basilar opacities favored to represent atelectasis. Cardiomegaly. Electronically Signed   By: Lovey Newcomer M.D.   On: 04/25/2015 18:14    Micro Results      No results found for this or any previous visit (from the past 240 hour(s)).     Today   Subjective    Ronelle Attig today has no headache,no chest abdominal pain,no new weakness tingling or numbness, feels much better wants to go home today.     Objective   Blood pressure 150/90, pulse 78, temperature 98.1 F (36.7 C), temperature source Oral, resp. rate 18, height 5\' 8"  (1.727 m), weight 175.27 kg (386 lb 6.4 oz), SpO2 98 %.   Intake/Output Summary (Last 24 hours) at 04/26/15 1014 Last data filed at 04/26/15 0100  Gross per 24 hour  Intake    444 ml  Output    550 ml  Net   -106 ml    Exam Awake Alert, Oriented x 3, No new F.N deficits, Normal affect [place].AT,PERRAL Supple  Neck,No JVD, No cervical lymphadenopathy appriciated.  Symmetrical Chest wall movement, Good air movement bilaterally, CTAB RRR,No Gallops,Rubs or new Murmurs, No Parasternal Heave +ve B.Sounds, Abd Soft, Non tender, No organomegaly appriciated, No rebound -guarding or rigidity. No  Cyanosis, Clubbing , No edema, No new Rash or bruise   Data Review   CBC w Diff:  Lab Results  Component Value Date   WBC 7.9 04/25/2015   HGB 12.4* 04/25/2015   HCT 39.1 04/25/2015   PLT 299 04/25/2015   LYMPHOPCT 12 04/25/2015   MONOPCT 13 04/25/2015   EOSPCT 4 04/25/2015   BASOPCT 0 04/25/2015    CMP:  Lab Results  Component Value Date   NA 137 04/25/2015   K 3.3* 04/25/2015   CL 102 04/25/2015   CO2 31 04/25/2015   BUN 24* 04/25/2015   CREATININE 3.26* 04/25/2015   CREATININE 3.00* 12/01/2014   PROT 7.6 04/25/2015   ALBUMIN 3.5 04/25/2015   BILITOT 0.6 04/25/2015   ALKPHOS 84 04/25/2015   AST 34 04/25/2015   ALT 30 04/25/2015  .   Total Time in preparing paper work, data evaluation and todays exam - 35 minutes  Thurnell Lose M.D on 04/26/2015 at 10:14 AM  [facility]   Office  [phone]

## 2015-04-27 ENCOUNTER — Telehealth: Payer: Self-pay | Admitting: *Deleted

## 2015-04-27 NOTE — Telephone Encounter (Signed)
Transition Care Management Follow-up Telephone Call  Frank Coffey, is a 35 y.o. male DOB Jan 03, 1981 MRN [id number].  Admission date: 04/25/2015 Admitting Physician Gennaro Africa, MD  Discharge Date: 04/26/2015   Primary MD Leeanne Rio, PA-C  Recommendations for primary care physician for things to follow:   Monitor weight , BMP and diuretic dose closely. Close outpatient cardiology follow-up   Admission Diagnosis Lightheadedness [R42] Renovascular hypertension [I15.0] Troponin I above reference range [R79.89]   Discharge Diagnosis Lightheadedness [R42] Renovascular hypertension [I15.0] Troponin I above reference range [R79.89]   Principal Problem:  Dizziness Active Problems:  Essential hypertension, benign  CKD (chronic kidney disease) stage 4, GFR 15-29 ml/min (HCC)   How have you been since you were released from the hospital? "I ain't been out that long. I just got out last night. I'm all right, you know what I'm saying? But my head still feels kind of crazy. I'm not dizzy, but it doesn't feel normal. It's the same as when I went into the hospital but not as bad."   Do you understand why you were in the hospital? yes   Do you understand the discharge instructions? yes   Where were you discharged to? Home   Items Reviewed:  Medications reviewed: yes  Allergies reviewed: yes  Dietary changes reviewed: yes, heart healthy; pt states he was following this diet already and the hospital staff were actually impressed that he was eating well and losing weight  Referrals reviewed: no, none made   Functional Questionnaire:   Activities of Daily Living (ADLs):   He states they are independent in the following: ambulation, bathing and hygiene, feeding, continence, grooming, toileting and dressing States they require assistance with the following: none   Any transportation issues/concerns?: no   Any patient concerns? Yes, pt needs refills on  torsemide and allopurinol when he comes for appt   Confirmed importance and date/time of follow-up visits scheduled yes  Provider Appointment booked with Elyn Aquas, PA-C 05/03/15 @ 11:30am.  Confirmed with patient if condition begins to worsen call PCP or go to the ER.  Patient was given the office number and encouraged to call back with question or concerns.  : yes

## 2015-04-29 ENCOUNTER — Ambulatory Visit: Payer: Managed Care, Other (non HMO) | Admitting: Nurse Practitioner

## 2015-04-30 ENCOUNTER — Encounter: Payer: Self-pay | Admitting: *Deleted

## 2015-05-03 ENCOUNTER — Ambulatory Visit (INDEPENDENT_AMBULATORY_CARE_PROVIDER_SITE_OTHER): Payer: Managed Care, Other (non HMO) | Admitting: Physician Assistant

## 2015-05-03 ENCOUNTER — Encounter: Payer: Self-pay | Admitting: Physician Assistant

## 2015-05-03 VITALS — BP 120/90 | HR 96 | Temp 99.2°F | Ht 68.5 in | Wt 386.2 lb

## 2015-05-03 DIAGNOSIS — R42 Dizziness and giddiness: Secondary | ICD-10-CM | POA: Diagnosis not present

## 2015-05-03 DIAGNOSIS — I10 Essential (primary) hypertension: Secondary | ICD-10-CM | POA: Diagnosis not present

## 2015-05-03 DIAGNOSIS — I509 Heart failure, unspecified: Secondary | ICD-10-CM | POA: Diagnosis not present

## 2015-05-03 DIAGNOSIS — N184 Chronic kidney disease, stage 4 (severe): Secondary | ICD-10-CM

## 2015-05-03 LAB — BASIC METABOLIC PANEL
BUN: 25 mg/dL — ABNORMAL HIGH (ref 6–23)
CALCIUM: 9.5 mg/dL (ref 8.4–10.5)
CO2: 28 meq/L (ref 19–32)
Chloride: 100 mEq/L (ref 96–112)
Creatinine, Ser: 2.94 mg/dL — ABNORMAL HIGH (ref 0.40–1.50)
GFR: 31.1 mL/min — ABNORMAL LOW (ref >60.00)
Glucose, Bld: 103 mg/dL — ABNORMAL HIGH (ref 70–99)
Potassium: 3.7 mEq/L (ref 3.5–5.1)
SODIUM: 138 meq/L (ref 135–145)

## 2015-05-03 MED ORDER — CARVEDILOL 25 MG PO TABS: 25.0000 mg | ORAL_TABLET | Freq: Two times a day (BID) | ORAL | Status: DC

## 2015-05-03 MED ORDER — TORSEMIDE 20 MG PO TABS: ORAL_TABLET | ORAL | Status: DC

## 2015-05-03 MED ORDER — LINACLOTIDE 145 MCG PO CAPS: 145.0000 ug | ORAL_CAPSULE | Freq: Every day | ORAL | Status: DC

## 2015-05-03 MED ORDER — SPIRONOLACTONE 25 MG PO TABS: 25.0000 mg | ORAL_TABLET | Freq: Every day | ORAL | Status: DC

## 2015-05-03 MED ORDER — ALLOPURINOL 100 MG PO TABS: 100.0000 mg | ORAL_TABLET | Freq: Every day | ORAL | Status: DC

## 2015-05-03 MED ORDER — HYDRALAZINE HCL 100 MG PO TABS: 100.0000 mg | ORAL_TABLET | Freq: Three times a day (TID) | ORAL | Status: DC

## 2015-05-03 NOTE — Progress Notes (Signed)
Pre visit review using our clinic review tool, if applicable. No additional management support is needed unless otherwise documented below in the visit note. 

## 2015-05-03 NOTE — Patient Instructions (Signed)
Please continue medications as directed. Stay hydrated and keep up with diet and exercise. Follow-up with specialists as scheduled.  Stop by the lab. I will call with results. Follow-up will be based on results.  Make sure to continue daily weights. If weight increasing despite taking medications, give me a call or come see me.

## 2015-05-04 NOTE — Progress Notes (Signed)
Patient presents to clinic today for hospital follow-up of lightheadedness and renovascular hypertension. Patient was admitted on 04/25/15 and discharged 04/26/15. Patient had presented to ER with dizziness. ER workup showed EKG with lateral T wave inversions and troponin at 0.05. Therefore patient was admitted. Troponin trended and negative for cardiac event. Cardiology consulted and felt patient safe to be discharged home on home medication list without changes. Close followed with Cardiology scheduled. Of note Creatinine at baseline during hospitalization.   Since discharge, patient states he has been doing very well. Patient denies chest pain, palpitations, lightheadedness, dizziness, vision changes or frequent headaches. Has been taking medications as directed. Endorses stable weight at home. Has actually lost 20 pounds over the past couple of months with his new diet and exercise regimen.   Past Medical History  Diagnosis Date  . CHF (congestive heart failure) ([place])   . Hypertensive heart disease with congestive heart failure and stage 3 kidney disease ([place])   . Chronic kidney disease, stage 3, mod decreased GFR   . Gout   . Hyperlipidemia   . Obesity   . Hx of migraine headaches   . Hypertension   . OSA (obstructive sleep apnea)     Current Outpatient Prescriptions on File Prior to Visit  Medication Sig Dispense Refill  . ARTIFICIAL TEARS 0.1-0.3 % SOLN Place 1 drop into the left eye daily as needed (for dry eyes).     . brimonidine-timolol (COMBIGAN) 0.2-0.5 % ophthalmic solution Place 1 drop into the left eye every 12 (twelve) hours.    [name] FIBER SUPPLEMENT PO Take 1 tablet by mouth daily.      No current facility-administered medications on file prior to visit.    No Known Allergies  Family History  Problem Relation Age of Onset  . Hypertension Mother   . Hyperlipidemia Mother   . Heart disease Mother   . Diabetes Neg Hx   . Heart attack Neg Hx   . Sudden death Neg Hx     . Hypertension Maternal Grandmother   . Stroke Maternal Grandmother   . Heart disease Maternal Grandmother   . Hyperlipidemia Maternal Grandmother   . Stroke Maternal Grandfather   . Hypertension Maternal Grandfather   . Heart disease Maternal Grandfather   . Hyperlipidemia Maternal Grandfather   . Alzheimer's disease Maternal Grandfather   . Cancer - Other Paternal Grandmother   . Healthy Brother     x2  . Healthy Sister     x2  . Healthy Son     x2  . Healthy Daughter     x1    Social History   Social History  . Marital Status: Married    Spouse Name: N/A  . Number of Children: N/A  . Years of Education: N/A   Social History Main Topics  . Smoking status: Never Smoker   . Smokeless tobacco: Never Used  . Alcohol Use: Yes     Comment: socially -- once every few months  . Drug Use: No  . Sexual Activity: Not Asked   Other Topics Concern  . None   Social History Narrative   Epworth Sleepiness Scale = 10 (as of 12/01/14)   Review of Systems - See HPI.  All other ROS are negative.  BP 120/90 mmHg  Pulse 96  Temp(Src) 99.2 F (37.3 C) (Oral)  Ht 5' 8.5" (1.74 m)  Wt 386 lb 4 oz (175.202 kg)  BMI 57.87 kg/m2  SpO2 97%  Physical Exam  Constitutional: He is oriented to person, place, and time and well-developed, well-nourished, and in no distress.  HENT:  Head: Normocephalic and atraumatic.  Eyes: Conjunctivae are normal.  Neck: Neck supple.  Cardiovascular: Normal rate, regular rhythm, normal heart sounds and intact distal pulses.   Pulses:      Dorsalis pedis pulses are 2+ on the right side, and 2+ on the left side.       Posterior tibial pulses are 2+ on the right side, and 2+ on the left side.  Pulmonary/Chest: Effort normal and breath sounds normal. No respiratory distress. He has no wheezes. He has no rales. He exhibits no tenderness.  Neurological: He is alert and oriented to person, place, and time.  Skin: Skin is warm and dry. No rash noted.   Psychiatric: Affect normal.  Vitals reviewed.   Recent Results (from the past 2160 hour(s))  Urinalysis, Routine w reflex microscopic     Status: Abnormal   Collection Time: 04/25/15  3:40 PM  Result Value Ref Range   Color, Urine YELLOW YELLOW   APPearance CLEAR CLEAR   Specific Gravity, Urine 1.012 1.005 - 1.030   pH 5.5 5.0 - 8.0   Glucose, UA NEGATIVE NEGATIVE mg/dL   Hgb urine dipstick NEGATIVE NEGATIVE   Bilirubin Urine NEGATIVE NEGATIVE   Ketones, ur NEGATIVE NEGATIVE mg/dL   Protein, ur 30 (A) NEGATIVE mg/dL   Nitrite NEGATIVE NEGATIVE   Leukocytes, UA NEGATIVE NEGATIVE  Urine microscopic-add on     Status: Abnormal   Collection Time: 04/25/15  3:40 PM  Result Value Ref Range   Squamous Epithelial / LPF NONE SEEN NONE SEEN   WBC, UA NONE SEEN 0 - 5 WBC/hpf   RBC / HPF NONE SEEN 0 - 5 RBC/hpf   Bacteria, UA RARE (A) NONE SEEN   Casts HYALINE CASTS (A) NEGATIVE   Urine-Other MUCOUS PRESENT   Comprehensive metabolic panel     Status: Abnormal   Collection Time: 04/25/15  3:50 PM  Result Value Ref Range   Sodium 137 135 - 145 mmol/L   Potassium 3.3 (L) 3.5 - 5.1 mmol/L   Chloride 102 101 - 111 mmol/L   CO2 31 22 - 32 mmol/L   Glucose, Bld 100 (H) 65 - 99 mg/dL   BUN 24 (H) 6 - 20 mg/dL   Creatinine, Ser 3.26 (H) 0.61 - 1.24 mg/dL   Calcium 8.8 (L) 8.9 - 10.3 mg/dL   Total Protein 7.6 6.5 - 8.1 g/dL   Albumin 3.5 3.5 - 5.0 g/dL   AST 34 15 - 41 U/L   ALT 30 17 - 63 U/L   Alkaline Phosphatase 84 38 - 126 U/L   Total Bilirubin 0.6 0.3 - 1.2 mg/dL   GFR calc non Af Amer 23 (L) >60 mL/min   GFR calc Af Amer 26 (L) >60 mL/min    Comment: (NOTE) The eGFR has been calculated using the CKD EPI equation. This calculation has not been validated in all clinical situations. eGFR's persistently <60 mL/min signify possible Chronic Kidney Disease.    Anion gap 4 (L) 5 - 15  Lipase, blood     Status: None   Collection Time: 04/25/15  3:50 PM  Result Value Ref Range    Lipase 22 11 - 51 U/L  Troponin I     Status: Abnormal   Collection Time: 04/25/15  3:50 PM  Result Value Ref Range   Troponin I 0.05 (H) <0.031 ng/mL    Comment:  PERSISTENTLY INCREASED TROPONIN VALUES IN THE RANGE OF 0.04-0.49 ng/mL CAN BE SEEN IN:       -UNSTABLE ANGINA       -CONGESTIVE HEART FAILURE       -MYOCARDITIS       -CHEST TRAUMA       -ARRYHTHMIAS       -LATE PRESENTING MYOCARDIAL INFARCTION       -COPD   CLINICAL FOLLOW-UP RECOMMENDED.   CBC with Differential     Status: Abnormal   Collection Time: 04/25/15  3:50 PM  Result Value Ref Range   WBC 7.9 4.0 - 10.5 K/uL   RBC 6.05 (H) 4.22 - 5.81 MIL/uL   Hemoglobin 12.4 (L) 13.0 - 17.0 g/dL   HCT 39.1 39.0 - 52.0 %   MCV 64.6 (L) 78.0 - 100.0 fL   MCH 20.5 (L) 26.0 - 34.0 pg   MCHC 31.7 30.0 - 36.0 g/dL   RDW 21.9 (H) 11.5 - 15.5 %   Platelets 299 150 - 400 K/uL   Neutrophils Relative % 71 %   Neutro Abs 5.6 1.7 - 7.7 K/uL   Lymphocytes Relative 12 %   Lymphs Abs 0.9 0.7 - 4.0 K/uL   Monocytes Relative 13 %   Monocytes Absolute 1.0 0.1 - 1.0 K/uL   Eosinophils Relative 4 %   Eosinophils Absolute 0.3 0.0 - 0.7 K/uL   Basophils Relative 0 %   Basophils Absolute 0.0 0.0 - 0.1 K/uL   RBC Morphology ELLIPTOCYTES   Brain natriuretic peptide     Status: None   Collection Time: 04/25/15  3:50 PM  Result Value Ref Range   B Natriuretic Peptide 39.1 0.0 - 100.0 pg/mL  Troponin I     Status: Abnormal   Collection Time: 04/25/15  7:15 PM  Result Value Ref Range   Troponin I 0.06 (H) <0.031 ng/mL    Comment:        PERSISTENTLY INCREASED TROPONIN VALUES IN THE RANGE OF 0.04-0.49 ng/mL CAN BE SEEN IN:       -UNSTABLE ANGINA       -CONGESTIVE HEART FAILURE       -MYOCARDITIS       -CHEST TRAUMA       -ARRYHTHMIAS       -LATE PRESENTING MYOCARDIAL INFARCTION       -COPD   CLINICAL FOLLOW-UP RECOMMENDED.   Troponin I-serum (0, 3, 6 hours)     Status: Abnormal   Collection Time: 04/25/15  9:52 PM  Result  Value Ref Range   Troponin I 0.05 (H) <0.031 ng/mL    Comment:        PERSISTENTLY INCREASED TROPONIN VALUES IN THE RANGE OF 0.04-0.49 ng/mL CAN BE SEEN IN:       -UNSTABLE ANGINA       -CONGESTIVE HEART FAILURE       -MYOCARDITIS       -CHEST TRAUMA       -ARRYHTHMIAS       -LATE PRESENTING MYOCARDIAL INFARCTION       -COPD   CLINICAL FOLLOW-UP RECOMMENDED.   Troponin I-serum (0, 3, 6 hours)     Status: Abnormal   Collection Time: 04/26/15 12:19 AM  Result Value Ref Range   Troponin I 0.06 (H) <0.031 ng/mL    Comment:        PERSISTENTLY INCREASED TROPONIN VALUES IN THE RANGE OF 0.04-0.49 ng/mL CAN BE SEEN IN:       -UNSTABLE ANGINA       -  CONGESTIVE HEART FAILURE       -MYOCARDITIS       -CHEST TRAUMA       -ARRYHTHMIAS       -LATE PRESENTING MYOCARDIAL INFARCTION       -COPD   CLINICAL FOLLOW-UP RECOMMENDED.   Troponin I-serum (0, 3, 6 hours)     Status: Abnormal   Collection Time: 04/26/15  3:25 AM  Result Value Ref Range   Troponin I 0.05 (H) <0.031 ng/mL    Comment:        PERSISTENTLY INCREASED TROPONIN VALUES IN THE RANGE OF 0.04-0.49 ng/mL CAN BE SEEN IN:       -UNSTABLE ANGINA       -CONGESTIVE HEART FAILURE       -MYOCARDITIS       -CHEST TRAUMA       -ARRYHTHMIAS       -LATE PRESENTING MYOCARDIAL INFARCTION       -COPD   CLINICAL FOLLOW-UP RECOMMENDED.   Basic Metabolic Panel (BMET)     Status: Abnormal   Collection Time: 05/03/15 12:27 PM  Result Value Ref Range   Sodium 138 135 - 145 mEq/L   Potassium 3.7 3.5 - 5.1 mEq/L   Chloride 100 96 - 112 mEq/L   CO2 28 19 - 32 mEq/L   Glucose, Bld 103 (H) 70 - 99 mg/dL   BUN 25 (H) 6 - 23 mg/dL   Creatinine, Ser 2.94 (H) 0.40 - 1.50 mg/dL   Calcium 9.5 8.4 - 10.5 mg/dL   GFR 31.10 (L) >60.00 mL/min    Assessment/Plan: Essential hypertension, benign BP stable. Patient has become compliant with CPAP machine. Continue current regimen. Will repeat BMP today. Continue diet and exercise.   CKD (chronic  kidney disease) stage 4, GFR 15-29 ml/min (HCC) Followed by Nephrology. Endorses good urinary output. Weight stable. Will check repeat BMP today. Continue current meds.   Dizziness Hospital workup negative. No residual symptoms. Continue chronic medication regimen. Will monitor.  CHF exacerbation Resolved. Patient euvolemic on exam. Is compliant with medications. Is working on weight loss, already losing about 15-20 pounds.

## 2015-05-04 NOTE — Assessment & Plan Note (Signed)
>>  ASSESSMENT AND PLAN FOR BENIGN HYPERTENSION WITH CHRONIC KIDNEY DISEASE, STAGE IV (HCC) WRITTEN ON 05/04/2015  6:56 AM BY Gaylyn Keas, WILLIAM C, PA-C  BP stable. Patient has become compliant with CPAP machine. Continue current regimen. Will repeat BMP today. Continue diet and exercise.

## 2015-05-04 NOTE — Assessment & Plan Note (Signed)
Followed by Nephrology. Endorses good urinary output. Weight stable. Will check repeat BMP today. Continue current meds.

## 2015-05-04 NOTE — Assessment & Plan Note (Signed)
BP stable. Patient has become compliant with CPAP machine. Continue current regimen. Will repeat BMP today. Continue diet and exercise.

## 2015-05-04 NOTE — Assessment & Plan Note (Signed)
Hospital workup negative. No residual symptoms. Continue chronic medication regimen. Will monitor.

## 2015-05-04 NOTE — Assessment & Plan Note (Signed)
Resolved. Patient euvolemic on exam. Is compliant with medications. Is working on weight loss, already losing about 15-20 pounds.

## 2015-05-11 ENCOUNTER — Encounter: Payer: Self-pay | Admitting: *Deleted

## 2015-06-08 ENCOUNTER — Encounter: Payer: Self-pay | Admitting: Cardiovascular Disease

## 2015-06-08 DIAGNOSIS — B0230 Zoster ocular disease, unspecified: Secondary | ICD-10-CM

## 2015-06-08 HISTORY — DX: Zoster ocular disease, unspecified: B02.30

## 2015-06-08 NOTE — Progress Notes (Signed)
Cardiology Office Note   Date:  06/08/2015   ID:  DEVEAN HEMPEL, DOB Jun 04, 1980, MRN [id number]  PCP:  Leeanne Rio, PA-C  Cardiologist:   Skeet Latch, MD   No chief complaint on file.   Patient ID: Frank Coffey is a 35 y.o. 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  Mr. Fouche was in the ED on 10/31 with a complaint of headache. At that time he was noted to be volume overloaded and received a dose of IV Lasix. He denied any shortness of breath, orthopnea or PND. He followed up with his primary care provider, [name], PA-C  and his torsemide was increased to twice daily.  Mr. 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 He was in the hospital for one week. And followed up with a cardiologist annually for several years. He reports that his heart returned to normal and he was dismissed from cardiology clinic.  Given that he has a history of heart failure he was referred to cardiology for further evaluation.  At that appointment BNP was mildly elevated to 102. He was hypokalemic at 3.3.   Mr. Vanwyhe underwent echocardiography 11/2014 that revealed a reduction in his LVEF to 30%.  He was treated for bronchitis by his PCP on 12/28/14.  He was admitted 04/25/14 with dizziness and a mildly elevated troponin (0.05).  Telemetry was unremarkable.     ED 04/25/14 for dizziness  . Past Medical History  Diagnosis Date  . CHF (congestive heart failure) ([place])   . 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 Chronic kidney disease, stage 3, mod decreased GFR   . Gout   . Hyperlipidemia   . Obesity   . Hx of migraine headaches   . Hypertension   . OSA (obstructive sleep apnea)     Past Surgical History  Procedure Laterality Date  . Cholecystectomy    . Retinal detachment repair w/  scleral buckle le      Left     Current Outpatient Prescriptions  Medication Sig Dispense Refill  . allopurinol (ZYLOPRIM) 100 MG tablet Take 1 tablet (100 mg total) by mouth daily. 30 tablet 3  . ARTIFICIAL TEARS 0.1-0.3 % SOLN Place 1 drop into the left eye daily as needed (for dry eyes).     . brimonidine-timolol (COMBIGAN) 0.2-0.5 % ophthalmic solution Place 1 drop into the left eye every 12 (twelve) hours.    . carvedilol (COREG) 25 MG tablet Take 1 tablet (25 mg total) by mouth 2 (two) times daily with a meal. 180 tablet 1  . EQ FIBER SUPPLEMENT PO Take 1 tablet by mouth daily.     . hydrALAZINE (APRESOLINE) 100 MG tablet Take 1 tablet (100 mg total) by mouth 3 (three) times daily. 90 tablet 0  . linaclotide (LINZESS) 145 MCG CAPS capsule Take 1 capsule (145 mcg total) by mouth daily. 30 capsule 2  . spironolactone (ALDACTONE) 25 MG tablet Take 1 tablet (25 mg total) by mouth daily. 90 tablet 1  . torsemide (DEMADEX) 20 MG tablet TAKE 1 TABLET ( 20 MG TOTAL) BY MOUTH twice a day 60 tablet 2   No current facility-administered medications for this visit.    Allergies:   Review of patient's allergies indicates no known allergies.  Social History:  The patient  reports that he has never smoked. He has never used smokeless tobacco. He reports that he drinks alcohol. He reports that he does not use illicit drugs.   Family History:  The patient's family history includes Alzheimer's disease in his maternal grandfather; Cancer - Other in his paternal grandmother; Healthy in his brother, daughter, sister, and son; Heart disease in his maternal grandfather, maternal grandmother, and mother; Hyperlipidemia in his maternal grandfather, maternal grandmother, and mother; Hypertension in his maternal grandfather, maternal grandmother, and mother; Stroke in his maternal grandfather and maternal grandmother. There is no history of Diabetes, Heart attack, or Sudden death.    ROS:  Please see the  history of present illness.   Otherwise, review of systems are positive for none.   All other systems are reviewed and negative.    PHYSICAL EXAM: VS:  There were no vitals taken for this visit. , BMI There is no weight on file to calculate BMI. GENERAL:  Well appearing.  Morbidly obese. HEENT:  Pupils equal round and reactive, fundi not visualized, oral mucosa unremarkable NECK:  No jugular venous distention, waveform within normal limits, carotid upstroke brisk and symmetric, no bruits, no thyromegaly LYMPHATICS:  No cervical adenopathy LUNGS:  Clear to auscultation bilaterally HEART:  RRR.  PMI not displaced or sustained,S1 and S2 within normal limits, no S3, no S4, no clicks, no rubs, no murmurs. Distant heart sounds.  ABD:  Flat, positive bowel sounds normal in frequency in pitch, no bruits, no rebound, no guarding, no midline pulsatile mass, no hepatomegaly, no splenomegaly EXT:  2 plus pulses throughout, no edema, no cyanosis no clubbing SKIN:  No rashes no nodules NEURO:  Cranial nerves II through XII grossly intact, motor grossly intact throughout PSYCH:  Cognitively intact, oriented to person place and time   EKG:  EKG is ordered today. The ekg ordered today demonstrates sinus rhythm rate 76 per minute. Left atrial enlargement. Left ventricular hypertrophy with repolarization abnormalities.  Echo 06/19/11: (Outside report)  LVEF 60%.  Mild LVH.   Echo 12/09/14: Study Conclusions  - Left ventricle: The cavity size was mildly dilated. Wall thickness was increased in a pattern of mild LVH. The estimated ejection fraction was 30%. Diffuse hypokinesis. Doppler parameters are consistent with abnormal left ventricular relaxation (grade 1 diastolic dysfunction). - Aortic valve: There was no stenosis. - Mitral valve: There was trivial regurgitation. - Left atrium: The atrium was mildly dilated. - Right ventricle: The cavity size was mildly dilated. Systolic function was  normal. - Pulmonary arteries: No complete TR doppler jet so unable to estimate PA systolic pressure. - Inferior vena cava: The vessel was normal in size. The respirophasic diameter changes were in the normal range (>= 50%), consistent with normal central venous pressure.  Impressions:  - Mildly dilated LV with mild LV hypertrophy. EF 30%, diffuse hypokinesis. Mildly dilated RV with normal systolic function. No significant valvular abnormalities.   Recent Labs: 04/25/2015: ALT 30; B Natriuretic Peptide 39.1; Hemoglobin 12.4*; Platelets 299 05/03/2015: BUN 25*; Creatinine, Ser 2.94*; Potassium 3.7; Sodium 138    Lipid Panel    Component Value Date/Time   CHOL 207* 03/10/2013 1535   TRIG 200* 03/10/2013 1535   HDL 39* 03/10/2013 1535   CHOLHDL 5.3 03/10/2013 1535   VLDL 40 03/10/2013 1535   LDLCALC 128* 03/10/2013 1535      Wt Readings from Last 3 Encounters:  05/03/15 175.202 kg (386 lb 4 oz)  04/25/15 175.27 kg (386 lb  6.4 oz)  12/28/14 176.721 kg (389 lb 9.6 oz)      ASSESSMENT AND PLAN:  # Chronic systolic heart failure: Mr. Halaby has a history of systolic heart failure occurred in the setting of viral cardiomyopathy currently improved. He recently presented with volume overload that has now group and he is euvolemic on exam. We will check a basic metabolic panel to assess his renal function and potassium. Will not make any other changes to his heart failure medications at this time. He will continue taking torsemide 40 mg twice daily and olmestartan.  # Hypertension: Well-controlled on olmesartan.  # Morbid Obesity: Mr. Cerveny has started exercising and was congratulated on these efforts. He has lost nearly 20 pounds, which is likely a combination water weight and exercise. He was also encouraged to keep up his dietary changes.   # CKD: Mr. Adamy has CK D4. He reports that he has been seen by a nephrologist and has a follow-up appointment in December.  Current  medicines are reviewed at length with the patient today.  The patient does not have concerns regarding medicines.  The following changes have been made:  no change  Labs/ tests ordered today include:   No orders of the defined types were placed in this encounter.     Disposition:   FU with Lulamae Skorupski C. Oval Linsey, MD in 6 months.    Signed, Skeet Latch, MD  06/08/2015 1:24 PM    [facility]  This encounter was created in error - please disregard.

## 2015-06-10 ENCOUNTER — Encounter: Payer: Self-pay | Admitting: *Deleted

## 2015-06-10 ENCOUNTER — Encounter: Payer: Managed Care, Other (non HMO) | Admitting: Cardiovascular Disease

## 2015-06-25 ENCOUNTER — Ambulatory Visit (INDEPENDENT_AMBULATORY_CARE_PROVIDER_SITE_OTHER): Payer: Managed Care, Other (non HMO) | Admitting: Physician Assistant

## 2015-06-25 ENCOUNTER — Encounter: Payer: Self-pay | Admitting: Physician Assistant

## 2015-06-25 VITALS — BP 142/72 | HR 81 | Temp 98.3°F | Resp 16 | Ht 68.5 in | Wt 389.2 lb

## 2015-06-25 DIAGNOSIS — K439 Ventral hernia without obstruction or gangrene: Secondary | ICD-10-CM

## 2015-06-25 NOTE — Progress Notes (Signed)
Pre visit review using our clinic review tool, if applicable. No additional management support is needed unless otherwise documented below in the visit note/SLS  

## 2015-06-25 NOTE — Patient Instructions (Signed)
No lifting more than 10 pounds. Tylenol for pain. Continue Linzess for chronic constipation.  Stay hydrated and keep up good fiber intake.  You will be contacted by General Surgery for assessment.  If you develop acute worsening of pain, if the hernia becomes hard or reddened, or if you are unable to pass gas for more than 24 hours.

## 2015-06-25 NOTE — Progress Notes (Signed)
Patient presents to clinic today c/o 1 month of a knot on mid abdomen that is tender to the touch. Started noticing after working out at [employer]. Has history of chronic constipation, well controlled previously with Linzess. Now endorses constipation despite taking medication. Denies nausea or vomiting. Endorses "knot" gets bigger with heavy lifting.   Past Medical History  Diagnosis Date  . CHF (congestive heart failure) ([place])   . Hypertensive heart disease with congestive heart failure and stage 3 kidney disease ([place])   . Chronic kidney disease, stage 3, mod decreased GFR   . Gout   . Hyperlipidemia   . Obesity   . Hx of migraine headaches   . Hypertension   . OSA (obstructive sleep apnea)   . 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 allopurinol (ZYLOPRIM) 100 MG tablet Take 1 tablet (100 mg total) by mouth daily. 30 tablet 3  . ARTIFICIAL TEARS 0.1-0.3 % SOLN Place 1 drop into the left eye daily as needed (for dry eyes).     . brimonidine-timolol (COMBIGAN) 0.2-0.5 % ophthalmic solution Place 1 drop into the left eye every 12 (twelve) hours.    . carvedilol (COREG) 25 MG tablet Take 1 tablet (25 mg total) by mouth 2 (two) times daily with a meal. 180 tablet 1  . EQ FIBER SUPPLEMENT PO Take 1 tablet by mouth daily.     . hydrALAZINE (APRESOLINE) 100 MG tablet Take 1 tablet (100 mg total) by mouth 3 (three) times daily. 90 tablet 0  . linaclotide (LINZESS) 145 MCG CAPS capsule Take 1 capsule (145 mcg total) by mouth daily. 30 capsule 2  . spironolactone (ALDACTONE) 25 MG tablet Take 1 tablet (25 mg total) by mouth daily. 90 tablet 1  . torsemide (DEMADEX) 20 MG tablet TAKE 1 TABLET ( 20 MG TOTAL) BY MOUTH twice a day 60 tablet 2   No current facility-administered medications on file prior to visit.    No Known Allergies  Family History  Problem Relation Age of Onset  . Hypertension Mother   .  Hyperlipidemia Mother   . Heart disease Mother   . Diabetes Neg Hx   . Heart attack Neg Hx   . Sudden death Neg Hx   . Hypertension Maternal Grandmother   . Stroke Maternal Grandmother   . Heart disease Maternal Grandmother   . Hyperlipidemia Maternal Grandmother   . Stroke Maternal Grandfather   . Hypertension Maternal Grandfather   . Heart disease Maternal Grandfather   . Hyperlipidemia Maternal Grandfather   . Alzheimer's disease Maternal Grandfather   . Cancer - Other Paternal Grandmother   . Healthy Brother     x2  . Healthy Sister     x2  . Healthy Son     x2  . Healthy Daughter     x1    Social History   Social History  . Marital Status: Married    Spouse Name: N/A  . Number of Children: N/A  . Years of Education: N/A   Social History Main Topics  . Smoking status: Never Smoker   . Smokeless tobacco: Never Used  . Alcohol Use: Yes     Comment: socially -- once every few months  . Drug Use: No  . Sexual Activity: Not Asked   Other Topics Concern  . None   Social History Narrative   Epworth Sleepiness Scale = 10 (as  of 12/01/14)   Review of Systems - See HPI.  All other ROS are negative.  BP 142/72 mmHg  Pulse 81  Temp(Src) 98.3 F (36.8 C) (Oral)  Resp 16  Ht 5' 8.5" (1.74 m)  Wt 389 lb 4 oz (176.563 kg)  BMI 58.32 kg/m2  SpO2 96%  Physical Exam  Constitutional: He is oriented to person, place, and time and well-developed, well-nourished, and in no distress.  HENT:  Head: Normocephalic and atraumatic.  Eyes: Conjunctivae are normal.  Neck: Neck supple.  Cardiovascular: Normal rate, regular rhythm, normal heart sounds and intact distal pulses.   Pulmonary/Chest: Effort normal and breath sounds normal. No respiratory distress. He has no wheezes. He has no rales. He exhibits no tenderness.  Abdominal: Soft. Bowel sounds are normal. He exhibits no distension. A hernia is present. Hernia confirmed positive in the ventral area.  Large ventral  herniation noted with patient going from laying to sitting. No hardness, redness noted. Easily reducible. Mild tenderness with palpation noted.  Neurological: He is alert and oriented to person, place, and time.  Skin: Skin is warm and dry. No rash noted.  Psychiatric: Affect normal.  Vitals reviewed.  Recent Results (from the past 2160 hour(s))  Urinalysis, Routine w reflex microscopic     Status: Abnormal   Collection Time: 04/25/15  3:40 PM  Result Value Ref Range   Color, Urine YELLOW YELLOW   APPearance CLEAR CLEAR   Specific Gravity, Urine 1.012 1.005 - 1.030   pH 5.5 5.0 - 8.0   Glucose, UA NEGATIVE NEGATIVE mg/dL   Hgb urine dipstick NEGATIVE NEGATIVE   Bilirubin Urine NEGATIVE NEGATIVE   Ketones, ur NEGATIVE NEGATIVE mg/dL   Protein, ur 30 (A) NEGATIVE mg/dL   Nitrite NEGATIVE NEGATIVE   Leukocytes, UA NEGATIVE NEGATIVE  Urine microscopic-add on     Status: Abnormal   Collection Time: 04/25/15  3:40 PM  Result Value Ref Range   Squamous Epithelial / LPF NONE SEEN NONE SEEN   WBC, UA NONE SEEN 0 - 5 WBC/hpf   RBC / HPF NONE SEEN 0 - 5 RBC/hpf   Bacteria, UA RARE (A) NONE SEEN   Casts HYALINE CASTS (A) NEGATIVE   Urine-Other MUCOUS PRESENT   Comprehensive metabolic panel     Status: Abnormal   Collection Time: 04/25/15  3:50 PM  Result Value Ref Range   Sodium 137 135 - 145 mmol/L   Potassium 3.3 (L) 3.5 - 5.1 mmol/L   Chloride 102 101 - 111 mmol/L   CO2 31 22 - 32 mmol/L   Glucose, Bld 100 (H) 65 - 99 mg/dL   BUN 24 (H) 6 - 20 mg/dL   Creatinine, Ser 3.26 (H) 0.61 - 1.24 mg/dL   Calcium 8.8 (L) 8.9 - 10.3 mg/dL   Total Protein 7.6 6.5 - 8.1 g/dL   Albumin 3.5 3.5 - 5.0 g/dL   AST 34 15 - 41 U/L   ALT 30 17 - 63 U/L   Alkaline Phosphatase 84 38 - 126 U/L   Total Bilirubin 0.6 0.3 - 1.2 mg/dL   GFR calc non Af Amer 23 (L) >60 mL/min   GFR calc Af Amer 26 (L) >60 mL/min    Comment: (NOTE) The eGFR has been calculated using the CKD EPI equation. This calculation  has not been validated in all clinical situations. eGFR's persistently <60 mL/min signify possible Chronic Kidney Disease.    Anion gap 4 (L) 5 - 15  Lipase, blood  Status: None   Collection Time: 04/25/15  3:50 PM  Result Value Ref Range   Lipase 22 11 - 51 U/L  Troponin I     Status: Abnormal   Collection Time: 04/25/15  3:50 PM  Result Value Ref Range   Troponin I 0.05 (H) <0.031 ng/mL    Comment:        PERSISTENTLY INCREASED TROPONIN VALUES IN THE RANGE OF 0.04-0.49 ng/mL CAN BE SEEN IN:       -UNSTABLE ANGINA       -CONGESTIVE HEART FAILURE       -MYOCARDITIS       -CHEST TRAUMA       -ARRYHTHMIAS       -LATE PRESENTING MYOCARDIAL INFARCTION       -COPD   CLINICAL FOLLOW-UP RECOMMENDED.   CBC with Differential     Status: Abnormal   Collection Time: 04/25/15  3:50 PM  Result Value Ref Range   WBC 7.9 4.0 - 10.5 K/uL   RBC 6.05 (H) 4.22 - 5.81 MIL/uL   Hemoglobin 12.4 (L) 13.0 - 17.0 g/dL   HCT 39.1 39.0 - 52.0 %   MCV 64.6 (L) 78.0 - 100.0 fL   MCH 20.5 (L) 26.0 - 34.0 pg   MCHC 31.7 30.0 - 36.0 g/dL   RDW 21.9 (H) 11.5 - 15.5 %   Platelets 299 150 - 400 K/uL   Neutrophils Relative % 71 %   Neutro Abs 5.6 1.7 - 7.7 K/uL   Lymphocytes Relative 12 %   Lymphs Abs 0.9 0.7 - 4.0 K/uL   Monocytes Relative 13 %   Monocytes Absolute 1.0 0.1 - 1.0 K/uL   Eosinophils Relative 4 %   Eosinophils Absolute 0.3 0.0 - 0.7 K/uL   Basophils Relative 0 %   Basophils Absolute 0.0 0.0 - 0.1 K/uL   RBC Morphology ELLIPTOCYTES   Brain natriuretic peptide     Status: None   Collection Time: 04/25/15  3:50 PM  Result Value Ref Range   B Natriuretic Peptide 39.1 0.0 - 100.0 pg/mL  Troponin I     Status: Abnormal   Collection Time: 04/25/15  7:15 PM  Result Value Ref Range   Troponin I 0.06 (H) <0.031 ng/mL    Comment:        PERSISTENTLY INCREASED TROPONIN VALUES IN THE RANGE OF 0.04-0.49 ng/mL CAN BE SEEN IN:       -UNSTABLE ANGINA       -CONGESTIVE HEART FAILURE        -MYOCARDITIS       -CHEST TRAUMA       -ARRYHTHMIAS       -LATE PRESENTING MYOCARDIAL INFARCTION       -COPD   CLINICAL FOLLOW-UP RECOMMENDED.   Troponin I-serum (0, 3, 6 hours)     Status: Abnormal   Collection Time: 04/25/15  9:52 PM  Result Value Ref Range   Troponin I 0.05 (H) <0.031 ng/mL    Comment:        PERSISTENTLY INCREASED TROPONIN VALUES IN THE RANGE OF 0.04-0.49 ng/mL CAN BE SEEN IN:       -UNSTABLE ANGINA       -CONGESTIVE HEART FAILURE       -MYOCARDITIS       -CHEST TRAUMA       -ARRYHTHMIAS       -LATE PRESENTING MYOCARDIAL INFARCTION       -COPD   CLINICAL FOLLOW-UP RECOMMENDED.   Troponin I-serum (0, 3, 6  hours)     Status: Abnormal   Collection Time: 04/26/15 12:19 AM  Result Value Ref Range   Troponin I 0.06 (H) <0.031 ng/mL    Comment:        PERSISTENTLY INCREASED TROPONIN VALUES IN THE RANGE OF 0.04-0.49 ng/mL CAN BE SEEN IN:       -UNSTABLE ANGINA       -CONGESTIVE HEART FAILURE       -MYOCARDITIS       -CHEST TRAUMA       -ARRYHTHMIAS       -LATE PRESENTING MYOCARDIAL INFARCTION       -COPD   CLINICAL FOLLOW-UP RECOMMENDED.   Troponin I-serum (0, 3, 6 hours)     Status: Abnormal   Collection Time: 04/26/15  3:25 AM  Result Value Ref Range   Troponin I 0.05 (H) <0.031 ng/mL    Comment:        PERSISTENTLY INCREASED TROPONIN VALUES IN THE RANGE OF 0.04-0.49 ng/mL CAN BE SEEN IN:       -UNSTABLE ANGINA       -CONGESTIVE HEART FAILURE       -MYOCARDITIS       -CHEST TRAUMA       -ARRYHTHMIAS       -LATE PRESENTING MYOCARDIAL INFARCTION       -COPD   CLINICAL FOLLOW-UP RECOMMENDED.   Basic Metabolic Panel (BMET)     Status: Abnormal   Collection Time: 05/03/15 12:27 PM  Result Value Ref Range   Sodium 138 135 - 145 mEq/L   Potassium 3.7 3.5 - 5.1 mEq/L   Chloride 100 96 - 112 mEq/L   CO2 28 19 - 32 mEq/L   Glucose, Bld 103 (H) 70 - 99 mg/dL   BUN 25 (H) 6 - 23 mg/dL   Creatinine, Ser 2.94 (H) 0.40 - 1.50 mg/dL   Calcium 9.5 8.4  - 10.5 mg/dL   GFR 31.10 (L) >60.00 mL/min    Assessment/Plan: 1. Ventral hernia without obstruction or gangrene Likely due to recent weight lifting -- is bench pressing > 300 pounds. He is to avoid lifting anything over 10 pounds. Continue Linzess and bowel regimen. Tylenol for pain. Referral to General Surgery placed. Alarm signs/symptoms discussed that would prompt ER evaluation. Patient voices understanding.  Leeanne Rio, PA-C

## 2015-07-06 ENCOUNTER — Ambulatory Visit: Payer: Self-pay | Admitting: Surgery

## 2015-07-06 NOTE — H&P (Signed)
Frank Coffey 07/06/2015 8:50 AM Location: [facility] Patient #: [id number] DOB: 12/16/1980 Married / Language: English / Race: Black or African American Male  Patient Care Team: Brunetta Jeans, PA-C as PCP - General (Physician Assistant) Michael Boston, MD as Consulting Physician (General Surgery) Skeet Latch, MD as Attending Physician (Cardiology) Mauricia Area, MD as Consulting Physician (Nephrology)  History of Present Illness [name]; 07/06/2015 9:53 AM) The patient is a 35 year old male who presents with an abdominal wall hernia. Note for "Abdominal hernia": Patient sent by [facility]. Raiford Noble physician assistant. Concern for ventral hernia.  Active weightlifting male. Notice pain and bulging. Felt a hard knot in his upper abdomen had a cholecyst[place] done about 10 [place] ago and [place]. I cannot find records. He did develop congestive heart failure. Had chronic kidney disease as well. Controlled with diuretics. Used to be over [employer]. Transition to [employer]. Saw primary care physician for concern of pain. Concern for hernia. Therefore consultation for surgery request. Usually related to bench pressing. He feels like it got larger, especially with lifting.   Patient has sleep apnea. Uses CPAP. Controls it well. He does weightlifting pretty regularly. As a bowel movement most days. Occasional constipation controlled with Lizness. No other abdominal surgeries as a cholecystectomy. 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 He is on glaucoma medicines for that. Legally blind in left eye.   Other Problems [name], CMA; 07/06/2015 8:51 AM) Chronic Renal Failure Syndrome Congestive Heart Failure Hemorrhoids High blood pressure  Past Surgical History [name], CMA; 07/06/2015 8:51 AM) Cataract Surgery Left. Gallbladder Surgery - Laparoscopic  Diagnostic Studies History [name], CMA; 07/06/2015 8:51 AM) Colonoscopy never  Allergies ([name], CMA; 07/06/2015 8:51 AM) No Known Drug Allergies 07/06/2015  Medication History ([name], CMA; 07/06/2015 8:52 AM) Allopurinol (100MG  Tablet, Oral) Active. Carvedilol (25MG  Tablet, Oral) Active. HydrALAZINE HCl (100MG  Tablet, Oral) Active. Linzess (145MCG Capsule, Oral) Active. Torsemide (20MG  Tablet, Oral) Active. Spironolactone (25MG  Tablet, Oral) Active. Medications Reconciled  Social History [name], CMA; 07/06/2015 8:51 AM) Alcohol use Occasional alcohol use. Caffeine use Tea. No drug use Tobacco use Never smoker.  Family History [name], CMA; 07/06/2015 8:51 AM) Hypertension Mother. Kidney Disease Mother.     Review of Systems [name] CMA; 07/06/2015 8:51 AM) General Not Present- Appetite Loss, Chills, Fatigue, Fever, Night Sweats, Weight Gain and Weight Loss. Skin Not Present- Change in Wart/Mole, Dryness, Hives, Jaundice, New Lesions, Non-Healing Wounds, Rash and Ulcer. HEENT Not Present- Earache, Hearing Loss, Hoarseness, Nose Bleed, Oral Ulcers, Ringing in the Ears, Seasonal Allergies, Sinus Pain, Sore Throat, Visual Disturbances, Wears glasses/contact lenses and Yellow Eyes. Respiratory Present- Snoring. Not Present- Bloody sputum, Chronic Cough, Difficulty Breathing and Wheezing. Breast Not Present- Breast Mass, Breast Pain, Nipple Discharge and Skin Changes. Cardiovascular Not Present- Chest Pain, Difficulty Breathing Lying Down, Leg Cramps, Palpitations, Rapid Heart Rate, Shortness of Breath and Swelling of Extremities. Gastrointestinal Not Present- Abdominal Pain, Bloating, Bloody Stool, Change in Bowel Habits, Chronic diarrhea, Constipation, Difficulty Swallowing, Excessive gas, Gets full quickly at meals, Hemorrhoids, Indigestion, Nausea, Rectal Pain and Vomiting. Male Genitourinary Not Present- Blood in Urine, Change in Urinary Stream, Frequency, Impotence,  Nocturia, Painful Urination, Urgency and Urine Leakage. Musculoskeletal Not Present- Back Pain, Joint Pain, Joint Stiffness, Muscle Pain, Muscle Weakness and Swelling of Extremities. Neurological Not Present- Decreased Memory, Fainting, Headaches, Numbness, Seizures, Tingling, Tremor, Trouble walking and Weakness. Psychiatric Not Present- Anxiety, Bipolar,  Change in Sleep Pattern, Depression, Fearful and Frequent crying. Endocrine Not Present- Cold Intolerance, Excessive Hunger, Hair Changes, Heat Intolerance, Hot flashes and New Diabetes. Hematology Not Present- Blood Thinners, Easy Bruising, Excessive bleeding, Gland problems, HIV and Persistent Infections.  Vitals ([name] CMA; 07/06/2015 8:51 AM) 07/06/2015 8:51 AM Weight: 389.8 lb Height: 69in Body Surface Area: 2.74 m Body Mass Index: 57.56 kg/m  Temp.: 52F(Temporal)  Pulse: 80 (Regular)  BP: 130/80 (Sitting, Left Arm, Standard)      Physical Exam [name]; 07/06/2015 9:34 AM)  General Mental Status-Alert. General Appearance-Not in acute distress, Not Sickly. Orientation-Oriented X3. Hydration-Well hydrated. Voice-Normal.  Integumentary Global Assessment Upon inspection and palpation of skin surfaces of the - Axillae: non-tender, no inflammation or ulceration, no drainage. and Distribution of scalp and body hair is normal. General Characteristics Temperature - normal warmth is noted.  Head and Neck Head-normocephalic, atraumatic with no lesions or palpable masses. Face Global Assessment - atraumatic, no absence of expression. Neck Global Assessment - no abnormal movements, no bruit auscultated on the right, no bruit auscultated on the left, no decreased range of motion, non-tender. Trachea-midline. Thyroid Gland Characteristics - non-tender.  Eye Eyeball - Left-Extraocular movements intact, No Nystagmus. Eyeball - Right-Extraocular movements intact, No Nystagmus. Cornea  - Left-No Hazy. Cornea - Right-No Hazy. Sclera/Conjunctiva - Left-No scleral icterus, No Discharge. Sclera/Conjunctiva - Right-No scleral icterus, No Discharge. Pupil - Left-Direct reaction to light normal. Pupil - Right-Direct reaction to light normal.  ENMT Ears Pinna - Left - no drainage observed, no generalized tenderness observed. Right - no drainage observed, no generalized tenderness observed. Nose and Sinuses External Inspection of the Nose - no destructive lesion observed. Inspection of the nares - Left - quiet respiration. Right - quiet respiration. Mouth and Throat Lips - Upper Lip - no fissures observed, no pallor noted. Lower Lip - no fissures observed, no pallor noted. Nasopharynx - no discharge present. Oral Cavity/Oropharynx - Tongue - no dryness observed. Oral Mucosa - no cyanosis observed. Hypopharynx - no evidence of airway distress observed.  Chest and Lung Exam Inspection Movements - Normal and Symmetrical. Accessory muscles - No use of accessory muscles in breathing. Palpation Palpation of the chest reveals - Non-tender. Auscultation Breath sounds - Normal and Clear.  Cardiovascular Auscultation Rhythm - Regular. Murmurs & Other Heart Sounds - Auscultation of the heart reveals - No Murmurs and No Systolic Clicks.  Abdomen Inspection Inspection of the abdomen reveals - No Visible peristalsis and No Abnormal pulsations. Umbilicus - No Bleeding, No Urine drainage. Palpation/Percussion Palpation and Percussion of the abdomen reveal - Soft, Non Tender, No Rebound tenderness, No Rigidity (guarding) and No Cutaneous hyperesthesia. Note: Morbidly obese but soft. Some fullness and irregularity at epigastric midline 4cm incision. Probable incarcerated hernia. Stasis recti. Sensitive at the umbilicus but no umbilical hernia.  Male Genitourinary Sexual Maturity Tanner 5 - Adult hair pattern and Adult penile size and shape. Note: No inguinal hernias. Normal  external genitalia. Epididymi, testes, and spermatic cords normal without any masses.  Peripheral Vascular Upper Extremity Inspection - Left - No Cyanotic nailbeds, Not Ischemic. Right - No Cyanotic nailbeds, Not Ischemic.  Neurologic Neurologic evaluation reveals -normal attention span and ability to concentrate, able to name objects and repeat phrases. Appropriate fund of knowledge , normal sensation and normal coordination. Mental Status Affect - not angry, not paranoid. Cranial Nerves-Normal Bilaterally. Gait-Normal.  Neuropsychiatric Mental status exam performed with findings of-able to articulate well with normal speech/language, rate, volume and coherence,  thought content normal with ability to perform basic computations and apply abstract reasoning and no evidence of hallucinations, delusions, obsessions or homicidal/suicidal ideation.  Musculoskeletal Global Assessment Spine, Ribs and Pelvis - no instability, subluxation or laxity. Right Upper Extremity - no instability, subluxation or laxity.  Lymphatic Head & Neck  General Head & Neck Lymphatics: Bilateral - Description - No Localized lymphadenopathy. Axillary  General Axillary Region: Bilateral - Description - No Localized lymphadenopathy. Femoral & Inguinal  Generalized Femoral & Inguinal Lymphatics: Left - Description - No Localized lymphadenopathy. Right - Description - No Localized lymphadenopathy.    Assessment & Plan [name]; 07/06/2015 9:39 AM)  Mickel Duhamel HERNIA (K43.0) Impression: Fullness and tenderness at epigastric incision consistent with incarcerated incisional hernia.  I think it would benefit from surgery repair this area. Given his activity level and obesity, would definitely want mesh underlay repair. Laparoscopic approach.  Most likely be an overnight stay given his sleep apnea. He is compliant on his CPAP. He says that helps him a lot.  He has had heart failure  and kidney disease. 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 With diuretics seems to be better controlled. Ejection fraction was around 35% but improved. Being followed regularly. He is due to see his cardiologist anyway. I would like Dr. Oval Linsey simple to make sure that nothing else needs to be adjusted.  Current Plans I recommended obtaining preoperative cardiac clearance. I am concerned about the health of the patient and the ability to tolerate the operation. Therefore, we will request clearance by cardiology to better assess operative risk & see if a reevaluation, further workup, etc is needed. Also recommendations on how medications such as for anticoagulation and blood pressure should be managed/held/restarted after surgery. PREOP - [place] - ENCOUNTER FOR PREOPERATIVE EXAMINATION FOR GENERAL SURGICAL PROCEDURE (Z01.818)  Current Plans You are being scheduled for surgery - Our schedulers will call you.  You should hear from our office's scheduling department within 5 working days about the location, date, and time of surgery. We try to make accommodations for patient's preferences in scheduling surgery, but sometimes the OR schedule or the surgeon's schedule prevents [place] from making those accommodations.  If you have not heard from our office [phone]) in 5 working days, call the office and ask for your surgeon's nurse.  If you have other questions about your diagnosis, plan, or surgery, call the office and ask for your surgeon's nurse.  Written instructions provided The anatomy & physiology of the abdominal wall was discussed. The pathophysiology of hernias was discussed. Natural history risks without surgery including progeressive enlargement, pain, incarceration, & strangulation was discussed. Contributors to complications such as smoking, obesity, diabetes, prior surgery, etc were discussed.  I feel the risks of no intervention will lead to serious problems that outweigh the operative risks;  therefore, I recommended surgery to reduce and repair the hernia. I explained laparoscopic techniques with possible need for an open approach. I noted the probable use of mesh to patch and/or buttress the hernia repair  Risks such as bleeding, infection, abscess, need for further treatment, heart attack, death, and other risks were discussed. I noted a good likelihood this will help address the problem. Goals of post-operative recovery were discussed as well. Possibility that this will not correct all symptoms was explained. I stressed the importance of low-impact activity, aggressive pain control, avoiding constipation, & not pushing through pain to minimize risk of post-operative chronic pain or injury. Possibility of reherniation especially with smoking, obesity, diabetes, immunosuppression,  and other health conditions was discussed. We will work to minimize complications.  An educational handout further explaining the pathology & treatment options was given as well. Questions were answered. The patient expresses understanding & wishes to proceed with surgery.  Pt Education - [facility] Hernia Post-Op HCI (Anai Lipson): discussed with patient and provided information. Pt Education - [facility] Pain Control (Elania Crowl) Pt Education - Pamphlet Given - Laparoscopic Hernia Repair: discussed with patient and provided information.  Adin Hector, M.D., F.A.C.S. Gastrointestinal and Minimally Invasive Surgery [facility] [address], [place], [place] [address] [phone] Main / Paging

## 2015-07-07 ENCOUNTER — Telehealth: Payer: Self-pay | Admitting: Physician Assistant

## 2015-07-07 NOTE — Telephone Encounter (Signed)
Received records from [facility] for appointment with Rosaria Ferries, PA on 07/09/15.  Records given to [employer] (medical records) for Rhonda's schedule on 07/09/15. lp

## 2015-07-09 ENCOUNTER — Ambulatory Visit: Payer: Managed Care, Other (non HMO) | Admitting: Physician Assistant

## 2015-07-19 ENCOUNTER — Ambulatory Visit (INDEPENDENT_AMBULATORY_CARE_PROVIDER_SITE_OTHER): Payer: Managed Care, Other (non HMO) | Admitting: Cardiology

## 2015-07-19 ENCOUNTER — Encounter: Payer: Self-pay | Admitting: Cardiology

## 2015-07-19 VITALS — BP 134/86 | HR 81 | Ht 68.0 in | Wt 394.0 lb

## 2015-07-19 DIAGNOSIS — Z01810 Encounter for preprocedural cardiovascular examination: Secondary | ICD-10-CM

## 2015-07-19 DIAGNOSIS — I129 Hypertensive chronic kidney disease with stage 1 through stage 4 chronic kidney disease, or unspecified chronic kidney disease: Secondary | ICD-10-CM

## 2015-07-19 DIAGNOSIS — I429 Cardiomyopathy, unspecified: Secondary | ICD-10-CM | POA: Diagnosis not present

## 2015-07-19 DIAGNOSIS — I428 Other cardiomyopathies: Secondary | ICD-10-CM

## 2015-07-19 DIAGNOSIS — G4733 Obstructive sleep apnea (adult) (pediatric): Secondary | ICD-10-CM

## 2015-07-19 DIAGNOSIS — N184 Chronic kidney disease, stage 4 (severe): Secondary | ICD-10-CM

## 2015-07-19 NOTE — Assessment & Plan Note (Signed)
EF 30% by echo Nov 2016

## 2015-07-19 NOTE — Assessment & Plan Note (Signed)
Compliant with C-pap 

## 2015-07-19 NOTE — Assessment & Plan Note (Signed)
Currently controlled.

## 2015-07-19 NOTE — Assessment & Plan Note (Signed)
>>  ASSESSMENT AND PLAN FOR BENIGN HYPERTENSION WITH CHRONIC KIDNEY DISEASE, STAGE IV (HCC) WRITTEN ON 07/19/2015  9:34 AM BY KILROY, LUKE K, PA-C  Currently controlled

## 2015-07-19 NOTE — Patient Instructions (Signed)
Your physician recommends that you schedule a follow-up appointment in: As schedule with Dr Oval Linsey  Your are cleared for your up coming procedure.

## 2015-07-19 NOTE — Assessment & Plan Note (Signed)
BMI 60 

## 2015-07-19 NOTE — Progress Notes (Signed)
Cardiology Office Note    Date:  07/19/2015   ID:  Frank Coffey, DOB 1980/05/28, MRN [id number]  PCP:  Leeanne Rio, PA-C  Cardiologist:  Dr Oval Linsey  Chief Complaint  Patient presents with  . Surgical Clearance    patient reports no complaints    History of Present Illness:  Frank Coffey is a 35 y.o.AA male with a history of NICM, morbid obesity, and OSA. His cardiomyopathy was diagnosed in 2005. He was told it was secondary to "a virus". There is no history of coronary angiogram. He had previously been followed in [facility]. He was seen again in Oct 2016 when he had CHF. Echo then showed his EF to 30%. He saw Dr Oval Linsey in May. He is in the office today for pre op clearance prior to ventral hernia repair.  He denies any unusual dyspnea, orthopnea, or edema. 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 He has been considering bariatric surgery. He is compliant with his C-pap, not so much with his diet.     Past Medical History  Diagnosis Date  . CHF (congestive heart failure) ([place])   . Hypertensive heart disease with congestive heart failure and stage 3 kidney disease ([place])   . Chronic kidney disease, stage 3, mod decreased GFR   . Gout   . Hyperlipidemia   . Obesity   . Hx of migraine headaches   . Hypertension   . OSA (obstructive sleep apnea)   . 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 Cholecystectomy    . Retinal detachment repair w/ scleral buckle le      Left    Current Medications: Outpatient Prescriptions Prior to Visit  Medication Sig Dispense Refill  . allopurinol (ZYLOPRIM) 100 MG tablet Take 1 tablet (100 mg total) by mouth daily. 30 tablet 3  . ARTIFICIAL TEARS 0.1-0.3 % SOLN Place 1 drop into the left eye daily as needed (for dry eyes).     . brimonidine-timolol (COMBIGAN) 0.2-0.5 % ophthalmic solution Place 1 drop into the left eye every 12 (twelve) hours.    . carvedilol (COREG) 25 MG tablet  Take 1 tablet (25 mg total) by mouth 2 (two) times daily with a meal. 180 tablet 1  . EQ FIBER SUPPLEMENT PO Take 1 tablet by mouth daily.     . hydrALAZINE (APRESOLINE) 100 MG tablet Take 1 tablet (100 mg total) by mouth 3 (three) times daily. 90 tablet 0  . spironolactone (ALDACTONE) 25 MG tablet Take 1 tablet (25 mg total) by mouth daily. 90 tablet 1  . torsemide (DEMADEX) 20 MG tablet TAKE 1 TABLET ( 20 MG TOTAL) BY MOUTH twice a day 60 tablet 2  . linaclotide (LINZESS) 145 MCG CAPS capsule Take 1 capsule (145 mcg total) by mouth daily. 30 capsule 2   No facility-administered medications prior to visit.     Allergies:   Review of patient's allergies indicates no known allergies.   Social History   Social History  . Marital Status: Married    Spouse Name: N/A  . Number of Children: N/A  . Years of Education: N/A   Social History Main Topics  . Smoking status: Never Smoker   . Smokeless tobacco: Never Used  . Alcohol Use: Yes     Comment: socially -- once every few months  . Drug Use: No  . Sexual Activity: Not Asked   Other Topics Concern  .  None   Social History Narrative   Epworth Sleepiness Scale = 10 (as of 12/01/14)     Family History:  The patient's family history includes Alzheimer's disease in his maternal grandfather; Cancer - Other in his paternal grandmother; Healthy in his brother, daughter, sister, and son; Heart disease in his maternal grandfather, maternal grandmother, and mother; Hyperlipidemia in his maternal grandfather, maternal grandmother, and mother; Hypertension in his maternal grandfather, maternal grandmother, and mother; Stroke in his maternal grandfather and maternal grandmother. There is no history of Diabetes, Heart attack, or Sudden death.   ROS:   Please see the history of present illness.    ROS All other systems reviewed and are negative.   PHYSICAL EXAM:   VS:  BP 134/86 mmHg  Pulse 81  Ht 5\' 8"  (1.727 m)  Wt 394 lb (178.717 kg)  BMI  59.92 kg/m2   GEN: Morbidly obese AA male in no acute distress HEENT: normal Neck: no JVD, carotid bruits, or masses Cardiac: RRR; no murmurs, rubs, or gallops,no edema  Respiratory:  clear to auscultation bilaterally, normal work of breathing GI: obese MS: no deformity or atrophy Skin: warm and dry, no rash Neuro:  Alert and Oriented x 3, Strength and sensation are intact Psych: euthymic mood, full affect  Wt Readings from Last 3 Encounters:  07/19/15 394 lb (178.717 kg)  06/25/15 389 lb 4 oz (176.563 kg)  05/03/15 386 lb 4 oz (175.202 kg)      Studies/Labs Reviewed:   EKG:  EKG is ordered today.  The ekg ordered today demonstrates NSR, Lateral TWI, LVH-no change from April 2017  Recent Labs: 04/25/2015: ALT 30; B Natriuretic Peptide 39.1; Hemoglobin 12.4*; Platelets 299 05/03/2015: BUN 25*; Creatinine, Ser 2.94*; Potassium 3.7; Sodium 138   Lipid Panel    Component Value Date/Time   CHOL 207* 03/10/2013 1535   TRIG 200* 03/10/2013 1535   HDL 39* 03/10/2013 1535   CHOLHDL 5.3 03/10/2013 1535   VLDL 40 03/10/2013 1535   LDLCALC 128* 03/10/2013 1535      ASSESSMENT:    1. Pre-operative cardiovascular examination   2. NICM (nonischemic cardiomyopathy) ([place])   3. Benign hypertension with chronic kidney disease, stage IV ([place])   4. CKD (chronic kidney disease) stage 4, GFR 15-29 ml/min (HCC)   5. OSA (obstructive sleep apnea)   6. Morbid obesity due to excess calories ([place])      PLAN:  In order of problems listed above:  1. Pt is an acceptable risk from a cardiovascular standpoint for proposed surgery. Avoid volume overload and provide C-pap Q HS. We will be available for any cardiac issues.     Medication Adjustments/Labs and Tests Ordered: Current medicines are reviewed at length with the patient today.  Concerns regarding medicines are outlined above.  Medication changes, Labs and Tests ordered today are listed in the Patient Instructions below. Patient  Instructions  Your physician recommends that you schedule a follow-up appointment in: As schedule with Dr Oval Linsey  Your are cleared for your up coming procedure.     Angelena Form, PA-C  07/19/2015 9:36 AM    [facility] [place], [place], [place]  [address] Phone: [phone]; Fax: [phone]

## 2015-07-19 NOTE — Assessment & Plan Note (Signed)
Last GFR 30

## 2015-07-19 NOTE — Assessment & Plan Note (Signed)
Seen pre ventral hernia repair.

## 2015-07-21 ENCOUNTER — Other Ambulatory Visit: Payer: Self-pay | Admitting: Physician Assistant

## 2015-07-21 NOTE — Telephone Encounter (Signed)
Rx request to pharmacy/[facility]  

## 2015-08-21 ENCOUNTER — Emergency Department (HOSPITAL_COMMUNITY)
Admission: EM | Admit: 2015-08-21 | Discharge: 2015-08-21 | Disposition: A | Discharge status: Home / Self Care | Payer: Managed Care, Other (non HMO) | Attending: Emergency Medicine | Admitting: Emergency Medicine

## 2015-08-21 ENCOUNTER — Encounter (HOSPITAL_COMMUNITY): Payer: Self-pay | Admitting: *Deleted

## 2015-08-21 ENCOUNTER — Emergency Department (HOSPITAL_COMMUNITY): Payer: Managed Care, Other (non HMO)

## 2015-08-21 DIAGNOSIS — N184 Chronic kidney disease, stage 4 (severe): Secondary | ICD-10-CM | POA: Diagnosis not present

## 2015-08-21 DIAGNOSIS — Z5181 Encounter for therapeutic drug level monitoring: Secondary | ICD-10-CM | POA: Diagnosis not present

## 2015-08-21 DIAGNOSIS — I130 Hypertensive heart and chronic kidney disease with heart failure and stage 1 through stage 4 chronic kidney disease, or unspecified chronic kidney disease: Secondary | ICD-10-CM | POA: Insufficient documentation

## 2015-08-21 DIAGNOSIS — G43909 Migraine, unspecified, not intractable, without status migrainosus: Secondary | ICD-10-CM | POA: Insufficient documentation

## 2015-08-21 DIAGNOSIS — I509 Heart failure, unspecified: Secondary | ICD-10-CM | POA: Diagnosis not present

## 2015-08-21 DIAGNOSIS — G43009 Migraine without aura, not intractable, without status migrainosus: Secondary | ICD-10-CM

## 2015-08-21 DIAGNOSIS — R51 Headache: Secondary | ICD-10-CM

## 2015-08-21 DIAGNOSIS — Z79899 Other long term (current) drug therapy: Secondary | ICD-10-CM | POA: Insufficient documentation

## 2015-08-21 DIAGNOSIS — R4781 Slurred speech: Secondary | ICD-10-CM

## 2015-08-21 LAB — COMPREHENSIVE METABOLIC PANEL
ALBUMIN: 3.8 g/dL (ref 3.5–5.0)
ALK PHOS: 84 U/L (ref 38–126)
ALT: 54 U/L (ref 17–63)
ANION GAP: 9 (ref 5–15)
AST: 50 U/L — AB (ref 15–41)
BILIRUBIN TOTAL: 0.9 mg/dL (ref 0.3–1.2)
BUN: 30 mg/dL — AB (ref 6–20)
CALCIUM: 9.2 mg/dL (ref 8.9–10.3)
CO2: 25 mmol/L (ref 22–32)
Chloride: 101 mmol/L (ref 101–111)
Creatinine, Ser: 3.11 mg/dL — ABNORMAL HIGH (ref 0.61–1.24)
GFR calc Af Amer: 28 mL/min — ABNORMAL LOW (ref >60)
GFR, EST NON AFRICAN AMERICAN: 24 mL/min — AB (ref >60)
GLUCOSE: 137 mg/dL — AB (ref 65–99)
Potassium: 3.4 mmol/L — ABNORMAL LOW (ref 3.5–5.1)
Sodium: 135 mmol/L (ref 135–145)
TOTAL PROTEIN: 7.6 g/dL (ref 6.5–8.1)

## 2015-08-21 LAB — DIFFERENTIAL
Basophils Absolute: 0 10*3/uL (ref 0.0–0.1)
Basophils Relative: 0 %
EOS PCT: 4 %
Eosinophils Absolute: 0.3 10*3/uL (ref 0.0–0.7)
LYMPHS ABS: 1.4 10*3/uL (ref 0.7–4.0)
LYMPHS PCT: 16 %
MONOS PCT: 9 %
Monocytes Absolute: 0.8 10*3/uL (ref 0.1–1.0)
NEUTROS PCT: 71 %
Neutro Abs: 6.3 10*3/uL (ref 1.7–7.7)

## 2015-08-21 LAB — CBC
HEMATOCRIT: 43.8 % (ref 39.0–52.0)
HEMOGLOBIN: 13.6 g/dL (ref 13.0–17.0)
MCH: 21.3 pg — AB (ref 26.0–34.0)
MCHC: 31.1 g/dL (ref 30.0–36.0)
MCV: 68.7 fL — ABNORMAL LOW (ref 78.0–100.0)
Platelets: 319 10*3/uL (ref 150–400)
RBC: 6.38 MIL/uL — AB (ref 4.22–5.81)
RDW: 19.7 % — ABNORMAL HIGH (ref 11.5–15.5)
WBC: 8.8 10*3/uL (ref 4.0–10.5)

## 2015-08-21 LAB — I-STAT CHEM 8, ED
BUN: 33 mg/dL — AB (ref 6–20)
CALCIUM ION: 1.09 mmol/L — AB (ref 1.13–1.30)
CHLORIDE: 101 mmol/L (ref 101–111)
CREATININE: 3.2 mg/dL — AB (ref 0.61–1.24)
GLUCOSE: 135 mg/dL — AB (ref 65–99)
HCT: 46 % (ref 39.0–52.0)
Hemoglobin: 15.6 g/dL (ref 13.0–17.0)
Potassium: 3.5 mmol/L (ref 3.5–5.1)
Sodium: 139 mmol/L (ref 135–145)
TCO2: 27 mmol/L (ref 0–100)

## 2015-08-21 LAB — PROTIME-INR
INR: 1.08
Prothrombin Time: 14 seconds (ref 11.4–15.2)

## 2015-08-21 LAB — I-STAT TROPONIN, ED: Troponin i, poc: 0.01 ng/mL (ref 0.00–0.08)

## 2015-08-21 LAB — CBG MONITORING, ED: Glucose-Capillary: 130 mg/dL — ABNORMAL HIGH (ref 65–99)

## 2015-08-21 LAB — APTT: aPTT: 31 seconds (ref 24–36)

## 2015-08-21 MED ORDER — DEXAMETHASONE SODIUM PHOSPHATE 10 MG/ML IJ SOLN
10.0000 mg | Freq: Once | INTRAMUSCULAR | Status: AC
  Administered 2015-08-21: 10 mg via INTRAVENOUS
  Filled 2015-08-21: qty 1

## 2015-08-21 MED ORDER — SODIUM CHLORIDE 0.9 % IV BOLUS (SEPSIS)
1000.0000 mL | Freq: Once | INTRAVENOUS | Status: AC
  Administered 2015-08-21: 1000 mL via INTRAVENOUS

## 2015-08-21 MED ORDER — DIPHENHYDRAMINE HCL 50 MG/ML IJ SOLN
25.0000 mg | Freq: Once | INTRAMUSCULAR | Status: AC
  Administered 2015-08-21: 25 mg via INTRAVENOUS
  Filled 2015-08-21: qty 1

## 2015-08-21 MED ORDER — METOCLOPRAMIDE HCL 5 MG/ML IJ SOLN
10.0000 mg | Freq: Once | INTRAMUSCULAR | Status: AC
  Administered 2015-08-21: 10 mg via INTRAVENOUS
  Filled 2015-08-21: qty 2

## 2015-08-21 NOTE — ED Provider Notes (Signed)
[facility] DEPT Provider Note   CSN: [id number] Arrival date & time: 08/21/15  [id number]  First Provider Contact:  First MD Initiated Contact with Patient 08/21/15 1014      An emergency department physician performed an initial assessment on this suspected stroke patient at 1007.  History   Chief Complaint Chief Complaint  Patient presents with  . Code Stroke    HPI Frank Coffey is a 35 y.o. male.  HPI  Patient presents for evaluation of headache, and was made code stroke. He reports a frontal headache for approximately 1 week. It is worsened today, he had a 10/10, frontal, nonradiating headche. It was associated with some double vision in the right eye and some possible slurred speech today. Upon arrival here the symptoms were not present. Denies any fevers, trauma, abdominal pain, nausea vomiting, chest pain.  He does state that he has had this once before, 4 years ago, where he had a headache with some slurred speech. He was admittedto the hospital at that time, and he states he never found a reason for it. He received some shots in the back of his head and the headache resolved. He states he hasn't had a problem since. He states this headache is very similar.  Past Medical History:  Diagnosis Date  . CHF (congestive heart failure) ([place])   . Chronic kidney disease, stage 3, mod decreased GFR   . Gout   . 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 Hx of migraine headaches   . Hyperlipidemia   . Hypertension   . 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 Obesity   . OSA (obstructive sleep apnea)     Patient Active Problem List   Diagnosis Date Noted  . Pre-operative cardiovascular examination 07/19/2015  . NICM (nonischemic cardiomyopathy) ([place]) 07/19/2015  . Morbid obesity due to excess calories ([place]) 07/19/2015  . 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 Dizziness 04/25/2015  . CHF exacerbation ([place]) 07/27/2014  . Chronic idiopathic  constipation 04/30/2013  . CHF (congestive heart failure) ([place]) 12/19/2012  . Benign hypertension with chronic kidney disease, stage IV ([place]) 12/19/2012  . Gout 12/19/2012  . Encounter to establish care 12/19/2012  . CKD (chronic kidney disease) stage 4, GFR 15-29 ml/min ([facility]) 12/19/2012  . OSA (obstructive sleep apnea) 12/19/2012  . Left knee injury 08/17/2011    Past Surgical History:  Procedure Laterality Date  . CHOLECYSTECTOMY    . RETINAL DETACHMENT REPAIR W/ SCLERAL BUCKLE LE     Left       Home Medications    Prior to Admission medications   Medication Sig Start Date End Date Taking? Authorizing Provider  allopurinol (ZYLOPRIM) 100 MG tablet Take 1 tablet (100 mg total) by mouth daily. 05/03/15   Brunetta Jeans, PA-C  ARTIFICIAL TEARS 0.1-0.3 % SOLN Place 1 drop into the left eye daily as needed (for dry eyes).     Historical Provider, MD  brimonidine-timolol (COMBIGAN) 0.2-0.5 % ophthalmic solution Place 1 drop into the left eye every 12 (twelve) hours.    Historical Provider, MD  carvedilol (COREG) 25 MG tablet Take 1 tablet (25 mg total) by mouth 2 (two) times daily with a meal. 05/03/15   Brunetta Jeans, PA-C  DUREZOL 0.05 % EMUL Place 1 drop into the left eye 2 (two) times daily. 07/08/15   Historical Provider, MD  EQ FIBER SUPPLEMENT PO Take 1 tablet by mouth daily.     Historical Provider,  MD  hydrALAZINE (APRESOLINE) 100 MG tablet TAKE 1 TABLET BY MOUTH THREE TIMES DAILY 07/21/15   Brunetta Jeans, PA-C  LINZESS 145 MCG CAPS capsule Take 1 capsule by mouth daily as needed. 05/03/15   Historical Provider, MD  spironolactone (ALDACTONE) 25 MG tablet Take 1 tablet (25 mg total) by mouth daily. 05/03/15   Brunetta Jeans, PA-C  torsemide (DEMADEX) 20 MG tablet TAKE 1 TABLET BY MOUTH TWICE DAILY 07/21/15   Brunetta Jeans, PA-C    Family History Family History  Problem Relation Age of Onset  . Hypertension Mother   . Hyperlipidemia Mother   . Heart disease Mother     . Hypertension Maternal Grandmother   . Stroke Maternal Grandmother   . Heart disease Maternal Grandmother   . Hyperlipidemia Maternal Grandmother   . Stroke Maternal Grandfather   . Hypertension Maternal Grandfather   . Heart disease Maternal Grandfather   . Hyperlipidemia Maternal Grandfather   . Alzheimer's disease Maternal Grandfather   . Cancer - Other Paternal Grandmother   . Healthy Brother     x2  . Healthy Sister     x2  . Healthy Son     x2  . Healthy Daughter     x1  . Diabetes Neg Hx   . Heart attack Neg Hx   . Sudden death Neg Hx     Social History Social History  Substance Use Topics  . Smoking status: Never Smoker  . Smokeless tobacco: Never Used  . Alcohol use Yes     Comment: socially -- once every few months     Allergies   Review of patient's allergies indicates no known allergies.   Review of Systems Review of Systems  Constitutional: Negative for chills and fever.  HENT: Negative for ear pain and sore throat.   Eyes: Positive for visual disturbance. Negative for pain.  Respiratory: Negative for cough and shortness of breath.   Cardiovascular: Negative for chest pain and palpitations.  Gastrointestinal: Negative for abdominal pain and vomiting.  Genitourinary: Negative for dysuria and hematuria.  Musculoskeletal: Negative for arthralgias and back pain.  Skin: Negative for color change and rash.  Neurological: Positive for speech difficulty. Negative for seizures and syncope.  All other systems reviewed and are negative.    Physical Exam Updated Vital Signs BP 121/81 (BP Location: Right Arm)   Pulse 72   Temp 98.9 F (37.2 C) (Oral)   Resp 18   Ht 5\' 8"  (1.727 m)   Wt (!) 172.8 kg   SpO2 92%   BMI 57.93 kg/m   Physical Exam  Constitutional: He appears well-developed and well-nourished.  HENT:  Head: Normocephalic and atraumatic.  Eyes: Conjunctivae are normal.  Neck: Neck supple.  Cardiovascular: Normal rate and regular  rhythm.   No murmur heard. Pulmonary/Chest: Effort normal and breath sounds normal. No respiratory distress.  Abdominal: Soft. There is no tenderness.  Musculoskeletal: He exhibits no edema.  Neurological: He is alert.  CN II-XII grossly intact Eyes: PERRL, EOMI Bilateral UE strength 5/5 Bilateral LE strength 5/5   Skin: Skin is warm and dry.  Psychiatric: He has a normal mood and affect.  Nursing note and vitals reviewed.    ED Treatments / Results  Labs (all labs ordered are listed, but only abnormal results are displayed) Labs Reviewed  CBC - Abnormal; Notable for the following:       Result Value   RBC 6.38 (*)    MCV 68.7 (*)  MCH 21.3 (*)    RDW 19.7 (*)    All other components within normal limits  COMPREHENSIVE METABOLIC PANEL - Abnormal; Notable for the following:    Potassium 3.4 (*)    Glucose, Bld 137 (*)    BUN 30 (*)    Creatinine, Ser 3.11 (*)    AST 50 (*)    GFR calc non Af Amer 24 (*)    GFR calc Af Amer 28 (*)    All other components within normal limits  CBG MONITORING, ED - Abnormal; Notable for the following:    Glucose-Capillary 130 (*)    All other components within normal limits  I-STAT CHEM 8, ED - Abnormal; Notable for the following:    BUN 33 (*)    Creatinine, Ser 3.20 (*)    Glucose, Bld 135 (*)    Calcium, Ion 1.09 (*)    All other components within normal limits  PROTIME-INR  APTT  DIFFERENTIAL  I-STAT TROPOININ, ED    EKG  EKG Interpretation  Date/Time:  Saturday August 21 2015 10:39:01 EDT Ventricular Rate:  85 PR Interval:    QRS Duration: 112 QT Interval:  375 QTC Calculation: 446 R Axis:   24 Text Interpretation:  Sinus rhythm Borderline prolonged PR interval Probable LVH with secondary repol abnrm No significant change since last tracing Confirmed by ZACKOWSKI  MD, Delatorre [phone]) on 08/21/2015 10:48:11 AM       Radiology Ct Head Code Stroke W/o Cm  Result Date: 08/21/2015 CLINICAL DATA:  Code stroke. Blurry  vision and slurred speech this morning. Headache beginning last week. EXAM: CT HEAD WITHOUT CONTRAST TECHNIQUE: Contiguous axial images were obtained from the base of the skull through the vertex without intravenous contrast. COMPARISON:  11/16/2014 FINDINGS: There is no evidence of acute cortical infarct, intracranial hemorrhage, mass, midline shift, or extra-axial fluid collection. Ventricles and sulci are normal. Pearline Cables- white differentiation is preserved. Prior left cataract extraction and scleral banding are again noted. The visualized paranasal sinuses and mastoid air cells are clear. No acute osseous abnormality is identified. ASPECTS [facility] Stroke Program Early CT Score, http://www.aspectsinstroke.com) - Ganglionic level infarction (caudate, lentiform nuclei, internal capsule, insula, M1-M3 cortex): 7 - Supraganglionic infarction (M4-M6 cortex): 3 Total score (0-10 with 10 being normal): 10 IMPRESSION: 1. Unremarkable head CT. 2. ASPECTS score 10. These results were called by telephone at the time of interpretation on 08/21/2015 at 10:35 am to Dr. Shon Hale, who verbally acknowledged these results. Electronically Signed   By: Logan Bores M.D.   On: 08/21/2015 10:36    Procedures Procedures (including critical care time)  Medications Ordered in ED Medications  sodium chloride 0.9 % bolus 1,000 mL (1,000 mLs Intravenous New Bag/Given 08/21/15 1120)  metoCLOPramide (REGLAN) injection 10 mg (10 mg Intravenous Given 08/21/15 1118)  diphenhydrAMINE (BENADRYL) injection 25 mg (25 mg Intravenous Given 08/21/15 1120)  dexamethasone (DECADRON) injection 10 mg (10 mg Intravenous Given 08/21/15 1121)     Initial Impression / Assessment and Plan / ED Course  I have reviewed the triage vital signs and the nursing notes.  Pertinent labs & imaging results that were available during my care of the patient were reviewed by me and considered in my medical decision making (see chart for details).  Clinical Course     Code stroke called on arrival. CT head was unremarkable. Neurologic examination was unremarkable, as above. Concern for primary headache given his history of migraine, and that this episode is similar, and his primary complaint  is headache. Feel this is unlikely to be acute CVA, and neurology consultant agreed. No signs of ICH on Ct.  Headache resolved with migraine cocktail.  Labs unremarkable.  Neuro exam on reassessment continued to be non-focal.   Feel like this is non-intractable complex migraine. The patient Will encourage PCP followup for consideration of migraine prevention medicine if his headaches recur.  We have discussed the discharge plan, including the plan for outpatient followup, and strict return precautions, including those that would require calling 911.     Final Clinical Impressions(s) / ED Diagnoses   Final diagnoses:  Migraine without aura and without status migrainosus, not intractable    New Prescriptions New Prescriptions   No medications on file     Levada Schilling, MD 08/21/15 1214    Levada Schilling, MD 08/21/15 [place], MD 08/23/15 1526

## 2015-08-21 NOTE — ED Notes (Signed)
CBG 130; RN, MD notified

## 2015-08-21 NOTE — ED Triage Notes (Signed)
Headache that started last week-- developed blurry/double vision and slurred speech this am.

## 2015-08-21 NOTE — Consult Note (Signed)
Neurology Consult Note  Reason for Consultation: CODE STROKE  Requesting provider: Fredia Sorrow, MD  CC: Headache, blurred vision, slurred speech  HPI: This is a 35 over right-handed man who reports that he has had a headache for approximately one to one and half weeks. This is described as a mild nonspecific headache, bitemporal and bifrontal in location without any associated features. He is not taking anything to help with his headache as he states that he prefers to avoid taking any medications at all possible. Due to this headache to the fact that he has been exercising twice a day and dieting in order to lose weight over the past month. This morning, however, he experienced increased intensity of his headache with associated blurry vision and slurred speech, stating that "I sounded like I was retarded." These symptoms were very brief lasting no more than a few seconds. He denies any associated numbness, tingling, weakness, difficulty swallowing, vertigo, double vision, or balance impairment. He is brought to the emergency department for evaluation. Code stroke was activated on the basis of his slurred speech.  Last seen well: 0845 NIH stroke scale score: 0 TPA given: No, low likelihood of stroke, NIH stroke scale score 0  PMH:  Past Medical History:  Diagnosis Date  . CHF (congestive heart failure) ([place])   . Chronic kidney disease, stage 3, mod decreased GFR   . Gout   . 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 Hx of migraine headaches   . Hyperlipidemia   . Hypertension   . 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 Obesity   . OSA (obstructive sleep apnea)     PSH:  Past Surgical History:  Procedure Laterality Date  . CHOLECYSTECTOMY    . RETINAL DETACHMENT REPAIR W/ SCLERAL BUCKLE LE     Left    Family history: Family History  Problem Relation Age of Onset  . Hypertension Mother   . Hyperlipidemia Mother   . Heart disease  Mother   . Hypertension Maternal Grandmother   . Stroke Maternal Grandmother   . Heart disease Maternal Grandmother   . Hyperlipidemia Maternal Grandmother   . Stroke Maternal Grandfather   . Hypertension Maternal Grandfather   . Heart disease Maternal Grandfather   . Hyperlipidemia Maternal Grandfather   . Alzheimer's disease Maternal Grandfather   . Cancer - Other Paternal Grandmother   . Healthy Brother     x2  . Healthy Sister     x2  . Healthy Son     x2  . Healthy Daughter     x1  . Diabetes Neg Hx   . Heart attack Neg Hx   . Sudden death Neg Hx     Social history:  Social History   Social History  . Marital status: Married    Spouse name: N/A  . Number of children: N/A  . Years of education: N/A   Occupational History  . Not on file.   Social History Main Topics  . Smoking status: Never Smoker  . Smokeless tobacco: Never Used  . Alcohol use Yes     Comment: socially -- once every few months  . Drug use: No  . Sexual activity: Not on file   Other Topics Concern  . Not on file   Social History Narrative   Epworth Sleepiness Scale = 10 (as of 12/01/14)    Current outpatient meds: No outpatient prescriptions have been marked as taking for the  08/21/15 encounter [facility] Encounter).    Current inpatient meds:  No current facility-administered medications for this encounter.    Current Outpatient Prescriptions  Medication Sig Dispense Refill  . allopurinol (ZYLOPRIM) 100 MG tablet Take 1 tablet (100 mg total) by mouth daily. 30 tablet 3  . ARTIFICIAL TEARS 0.1-0.3 % SOLN Place 1 drop into the left eye daily as needed (for dry eyes).     . brimonidine-timolol (COMBIGAN) 0.2-0.5 % ophthalmic solution Place 1 drop into the left eye every 12 (twelve) hours.    . carvedilol (COREG) 25 MG tablet Take 1 tablet (25 mg total) by mouth 2 (two) times daily with a meal. 180 tablet 1  . DUREZOL 0.05 % EMUL Place 1 drop into the left eye 2 (two) times daily.    [name]  FIBER SUPPLEMENT PO Take 1 tablet by mouth daily.     . hydrALAZINE (APRESOLINE) 100 MG tablet TAKE 1 TABLET BY MOUTH THREE TIMES DAILY 90 tablet 0  . LINZESS 145 MCG CAPS capsule Take 1 capsule by mouth daily as needed.  2  . spironolactone (ALDACTONE) 25 MG tablet Take 1 tablet (25 mg total) by mouth daily. 90 tablet 1  . torsemide (DEMADEX) 20 MG tablet TAKE 1 TABLET BY MOUTH TWICE DAILY 60 tablet 2    Allergies: No Known Allergies  ROS: As per HPI. A full 14-point review of systems was performed and is otherwise unremarkable.   PE:  BP 120/72 (BP Location: Right Arm)   Pulse 92   Temp 98.9 F (37.2 C) (Oral)   Resp 16   Ht 5\' 8"  (1.727 m)   Wt (!) 172.8 kg (381 lb)   SpO2 95%   BMI 57.93 kg/m   General: WD obese African-American man resting on ED gurney no acute distress. AAO x4. Speech clear, no dysarthria. No aphasia. Follows commands briskly. Affect is bright with congruent mood. Comportment is normal.  HEENT: Normocephalic. Neck supple without LAD. MMM, OP clear. Dentition good. Sclerae anicteric. No conjunctival injection.  CV: Regular, no murmur. Carotid pulses full and symmetric, no bruits. Distal pulses 2+ and symmetric.  Lungs: CTAB.  Abdomen: Soft, non-distended, non-tender. Bowel sounds present x4.  Extremities: No C/C/E. Neuro:  CN: Pupils are equal and round. They are symmetrically reactive from 3-->2 mm. EOMI without nystagmus. No reported diplopia. Facial sensation is intact to light touch. Face is symmetric at rest with normal strength and mobility. Hearing is intact to conversational voice. Palate elevates symmetrically and uvula is midline. Voice is normal in tone, pitch and quality. Bilateral SCM and trapezii are 5/5. Tongue is midline with normal bulk and mobility.  Motor: Normal bulk, tone, and strength. No tremor or other abnormal movements. No drift.  Sensation: Intact to light touch, pinprick, vibration, and joint position.  DTRs: 2+, symmetric. Toes  downgoing bilaterally. No pathologic reflexes.  Coordination: Finger-to-nose and heel-to-shin are without dysmetria. Finger taps are normal in amplitude and speed, no decrement.  .   Labs:  Lab Results  Component Value Date   WBC 8.8 08/21/2015   HGB 15.6 08/21/2015   HCT 46.0 08/21/2015   PLT 319 08/21/2015   GLUCOSE 135 (H) 08/21/2015   CHOL 207 (H) 03/10/2013   TRIG 200 (H) 03/10/2013   HDL 39 (L) 03/10/2013   LDLCALC 128 (H) 03/10/2013   ALT 54 08/21/2015   AST 50 (H) 08/21/2015   NA 139 08/21/2015   K 3.5 08/21/2015   CL 101 08/21/2015   CREATININE  3.20 (H) 08/21/2015   BUN 33 (H) 08/21/2015   CO2 25 08/21/2015   TSH 2.858 08/26/2013   INR 1.08 08/21/2015   HGBA1C 5.8 (H) 03/10/2013    Imaging:  I have personally and independently reviewed CT scan of the head without contrast from today. This is unremarkable.    Assessment and Plan:  1. Headache: The patient is a history of migraine headaches but this headache is somewhat different as it is lower intensity without any associated features. He does not have any focal deficits on his examination, and his slurred speech and blurred vision are somewhat nonspecific and occurred in the setting of increased headache today. I do not suspect that this represented a cerebrovascular event. He may be correct in that he has been dieting with significantly limited intake in the setting of significant exercise 2 times per day. At this point, I recommend symptomatic treatment of his headache with nonsteroidal anti-inflammatories and/or Tylenol as needed. Ensure adequate hydration.  TIA cannot be completely excluded on the basis of symptoms alone. He does have risk factors for cerebrovascular disease which would include obesity, hyperlipidemia, hypertension, obstructive sleep apnea. Continue to ensure aggressive control of risk factors. He may benefit from aspirin 81 mg daily given his plethora of risk factors. I would recommend checking  echocardiogram and carotid Dopplers for further risk stratification.

## 2015-09-13 ENCOUNTER — Telehealth: Payer: Self-pay | Admitting: *Deleted

## 2015-09-13 NOTE — Telephone Encounter (Signed)
Received Treatment and Self Management Plan Request Form via fax from [facility].  Called and spoke with Priscille Kluver, RN Case Manager to find out what information is needed.  She stated that the last OV with medication list will be fine.   Faxed form along with last OV note.  Confirmation received.//AB/CMA

## 2015-10-04 ENCOUNTER — Other Ambulatory Visit: Payer: Self-pay | Admitting: Physician Assistant

## 2015-10-04 NOTE — Telephone Encounter (Signed)
Rx request to pharmacy/[facility]  

## 2015-10-21 ENCOUNTER — Other Ambulatory Visit: Payer: Self-pay | Admitting: Physician Assistant

## 2015-10-21 NOTE — Telephone Encounter (Signed)
Medication filled to pharmacy as requested.   

## 2015-12-19 ENCOUNTER — Other Ambulatory Visit: Payer: Self-pay | Admitting: Physician Assistant

## 2015-12-20 NOTE — Telephone Encounter (Signed)
Refill sent per [facility] refill protocol/SLS  

## 2016-01-11 ENCOUNTER — Other Ambulatory Visit: Payer: Self-pay | Admitting: Physician Assistant

## 2016-01-11 ENCOUNTER — Telehealth: Payer: Self-pay | Admitting: Physician Assistant

## 2016-01-11 DIAGNOSIS — I10 Essential (primary) hypertension: Secondary | ICD-10-CM

## 2016-01-11 DIAGNOSIS — I509 Heart failure, unspecified: Secondary | ICD-10-CM

## 2016-01-11 MED ORDER — SPIRONOLACTONE 25 MG PO TABS: 25.0000 mg | ORAL_TABLET | Freq: Every day | ORAL | 3 refills | Status: DC

## 2016-01-11 MED ORDER — ALLOPURINOL 100 MG PO TABS: 100.0000 mg | ORAL_TABLET | Freq: Every day | ORAL | 3 refills | Status: DC

## 2016-01-11 NOTE — Telephone Encounter (Signed)
Patient is requesting a transfer of care from [facility] to Dr. Nani Ravens. Please advise.   Patient phone: [phone]

## 2016-01-11 NOTE — Telephone Encounter (Signed)
Please advise. TL/CMA 

## 2016-01-11 NOTE — Telephone Encounter (Signed)
Patient informed that transfer was approved. He will schedule an appointment with Dr. Nani Ravens when needed.

## 2016-01-11 NOTE — Telephone Encounter (Signed)
Ok to transfer. 

## 2016-01-11 NOTE — Telephone Encounter (Signed)
Patient is requesting a refill of allopurinol (ZYLOPRIM) 100 MG tablet And spironolactone (ALDACTONE) 25 MG tablet And torsemide (DEMADEX) 20 MG tablet  Patient states that he will be out of these mediations this week. Please advise   Pharmacy: [facility] [place], [place]

## 2016-01-11 NOTE — Telephone Encounter (Signed)
OK 

## 2016-01-11 NOTE — Telephone Encounter (Signed)
Medication filled to pharmacy as requested.   

## 2016-01-21 ENCOUNTER — Other Ambulatory Visit: Payer: Self-pay | Admitting: Physician Assistant

## 2016-01-25 NOTE — Telephone Encounter (Signed)
Advised patient we approved his medication but will need a follow up appointment. He is agreeable but will call the [place] location to get scheduled with Dr. Nani Ravens.

## 2016-02-01 ENCOUNTER — Other Ambulatory Visit: Payer: Self-pay | Admitting: Physician Assistant

## 2016-02-07 ENCOUNTER — Other Ambulatory Visit: Payer: Self-pay | Admitting: Emergency Medicine

## 2016-02-07 ENCOUNTER — Telehealth: Payer: Self-pay | Admitting: Physician Assistant

## 2016-02-07 MED ORDER — TORSEMIDE 20 MG PO TABS: 20.0000 mg | ORAL_TABLET | Freq: Two times a day (BID) | ORAL | 0 refills | Status: DC

## 2016-02-07 NOTE — Telephone Encounter (Signed)
Advised patient rx was sent to the pharmacy and will discuss at appointment for the [place] referral. He is agreeable. Will keep scheduled appt

## 2016-02-07 NOTE — Telephone Encounter (Signed)
Sent 30 of the toresemide to the pharmacy

## 2016-02-07 NOTE — Telephone Encounter (Signed)
Ok to give 30 tablets until his follow-up.

## 2016-02-07 NOTE — Telephone Encounter (Signed)
Pt needs refill on torsemide, Pt has upcoming on 2/7, pt states that he is out of this, pt also asking for a referral to a nutritionist, [facility] on [address]

## 2016-02-18 ENCOUNTER — Ambulatory Visit (INDEPENDENT_AMBULATORY_CARE_PROVIDER_SITE_OTHER): Payer: Managed Care, Other (non HMO) | Admitting: Physician Assistant

## 2016-02-18 ENCOUNTER — Encounter: Payer: Self-pay | Admitting: Physician Assistant

## 2016-02-18 VITALS — BP 122/88 | HR 88 | Temp 98.4°F | Resp 16 | Ht 68.5 in | Wt 385.0 lb

## 2016-02-18 DIAGNOSIS — I509 Heart failure, unspecified: Secondary | ICD-10-CM

## 2016-02-18 DIAGNOSIS — N184 Chronic kidney disease, stage 4 (severe): Secondary | ICD-10-CM | POA: Diagnosis not present

## 2016-02-18 DIAGNOSIS — B351 Tinea unguium: Secondary | ICD-10-CM | POA: Diagnosis not present

## 2016-02-18 DIAGNOSIS — M1039 Gout due to renal impairment, multiple sites: Secondary | ICD-10-CM

## 2016-02-18 DIAGNOSIS — Z0000 Encounter for general adult medical examination without abnormal findings: Secondary | ICD-10-CM

## 2016-02-18 DIAGNOSIS — I129 Hypertensive chronic kidney disease with stage 1 through stage 4 chronic kidney disease, or unspecified chronic kidney disease: Secondary | ICD-10-CM

## 2016-02-18 LAB — HEMOGLOBIN A1C: Hgb A1c MFr Bld: 6.2 % (ref 4.6–6.5)

## 2016-02-18 LAB — COMPREHENSIVE METABOLIC PANEL
ALBUMIN: 4 g/dL (ref 3.5–5.2)
ALK PHOS: 101 U/L (ref 39–117)
ALT: 21 U/L (ref 0–53)
AST: 21 U/L (ref 0–37)
BUN: 32 mg/dL — AB (ref 6–23)
CO2: 30 mEq/L (ref 19–32)
CREATININE: 3.2 mg/dL — AB (ref 0.40–1.50)
Calcium: 9.4 mg/dL (ref 8.4–10.5)
Chloride: 100 mEq/L (ref 96–112)
GFR: 28.09 mL/min — ABNORMAL LOW (ref >60.00)
GLUCOSE: 83 mg/dL (ref 70–99)
Potassium: 3.9 mEq/L (ref 3.5–5.1)
SODIUM: 137 meq/L (ref 135–145)
TOTAL PROTEIN: 7.7 g/dL (ref 6.0–8.3)
Total Bilirubin: 0.5 mg/dL (ref 0.2–1.2)

## 2016-02-18 LAB — URIC ACID: URIC ACID, SERUM: 6.4 mg/dL (ref 4.0–7.8)

## 2016-02-18 LAB — CBC
HCT: 41.2 % (ref 39.0–52.0)
HEMOGLOBIN: 12.9 g/dL — AB (ref 13.0–17.0)
MCHC: 31.3 g/dL (ref 30.0–36.0)
MCV: 68.2 fl — AB (ref 78.0–100.0)
PLATELETS: 350 10*3/uL (ref 150.0–400.0)
RBC: 6.04 Mil/uL — ABNORMAL HIGH (ref 4.22–5.81)
RDW: 20.7 % — AB (ref 11.5–15.5)
WBC: 9.6 10*3/uL (ref 4.0–10.5)

## 2016-02-18 LAB — TSH: TSH: 3.12 u[IU]/mL (ref 0.35–4.50)

## 2016-02-18 LAB — LIPID PANEL
CHOL/HDL RATIO: 6
Cholesterol: 209 mg/dL — ABNORMAL HIGH (ref 0–200)
HDL: 34.3 mg/dL — AB (ref >39.00)
LDL Cholesterol: 141 mg/dL — ABNORMAL HIGH (ref 0–99)
NONHDL: 175.04
TRIGLYCERIDES: 171 mg/dL — AB (ref 0.0–149.0)
VLDL: 34.2 mg/dL (ref 0.0–40.0)

## 2016-02-18 NOTE — Progress Notes (Addendum)
Patient presents to clinic today for annual exam.  Patient is fasting for labs.  Chronic Issues: Nonischemic Cardiomyopathy/ CHF -- Followed by Cardiology (Dr. Oval Linsey) Due for follow-up. Is currently on multi-drug regimen. Torsemide as directed. Body mass index is 57.69 kg/m. Denies PND or orthopnea. Denies peripheral edema with the Torsemide. .  CKD -- Stage IV, Followed by Nephology with recent follow-up. Was told renal function was stable. Has follow-up monthly. Endorses good urinary output.   Hypertension -- Patient is currently on regimen of Aldactone 25 mg BID (recently changed by Nephrology), Hydralazine 100 mg TID and Coreg 25 mg BID. Endorses taking as directed. Patient denies chest pain, palpitations, lightheadedness, dizziness, vision changes or frequent headaches.  BP Readings from Last 3 Encounters:  02/18/16 122/88  08/21/15 133/88  07/19/15 134/86   Morbid Obesity -- Body mass index is 57.69 kg/m. Is working on exercise. Has made great strides with this -- doing resistance training several times per week and increasing cardio. Bariatric surgery likely not an option giving significant CKD, CHF and Cardiomyopathy.  Health Maintenance: Immunizations -- Flu and Tetanus up-to-date.   Past Medical History:  Diagnosis Date  . CHF (congestive heart failure) ([place])   . Chronic kidney disease, stage 3, mod decreased GFR   . Gout   . 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 Hx of migraine headaches   . Hyperlipidemia   . Hypertension   . 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 Obesity   . OSA (obstructive sleep apnea)     Past Surgical History:  Procedure Laterality Date  . CHOLECYSTECTOMY    . RETINAL DETACHMENT REPAIR W/ SCLERAL BUCKLE LE     Left    Current Outpatient Prescriptions on File Prior to Visit  Medication Sig Dispense Refill  . allopurinol (ZYLOPRIM) 100 MG tablet Take 1 tablet (100 mg total) by mouth daily.  (Patient taking differently: Take 300 mg by mouth daily. ) 30 tablet 3  . ARTIFICIAL TEARS 0.1-0.3 % SOLN Place 1 drop into the left eye daily as needed (for dry eyes).     . carvedilol (COREG) 25 MG tablet Take 1 tablet (25 mg total) by mouth 2 (two) times daily with a meal. 180 tablet 1  . EQ FIBER SUPPLEMENT PO Take 1 tablet by mouth daily.     . hydrALAZINE (APRESOLINE) 100 MG tablet TAKE 1 TABLET BY MOUTH THREE TIMES DAILY 60 tablet 0  . LINZESS 145 MCG CAPS capsule Take 1 capsule by mouth daily as needed.  2  . spironolactone (ALDACTONE) 25 MG tablet Take 1 tablet (25 mg total) by mouth daily. (Patient taking differently: Take 25 mg by mouth 2 (two) times daily. ) 30 tablet 3  . torsemide (DEMADEX) 20 MG tablet Take 1 tablet (20 mg total) by mouth 2 (two) times daily. 30 tablet 0   No current facility-administered medications on file prior to visit.     No Known Allergies  Family History  Problem Relation Age of Onset  . Hypertension Mother   . Hyperlipidemia Mother   . Heart disease Mother   . Hypertension Maternal Grandmother   . Stroke Maternal Grandmother   . Heart disease Maternal Grandmother   . Hyperlipidemia Maternal Grandmother   . Stroke Maternal Grandfather   . Hypertension Maternal Grandfather   . Heart disease Maternal Grandfather   . Hyperlipidemia Maternal Grandfather   . Alzheimer's disease Maternal Grandfather   . Cancer -  Other Paternal Grandmother   . Healthy Brother     x2  . Healthy Sister     x2  . Healthy Son     x2  . Healthy Daughter     x1  . Diabetes Neg Hx   . Heart attack Neg Hx   . Sudden death Neg Hx     Social History   Social History  . Marital status: Married    Spouse name: N/A  . Number of children: N/A  . Years of education: N/A   Occupational History  . Not on file.   Social History Main Topics  . Smoking status: Never Smoker  . Smokeless tobacco: Never Used  . Alcohol use Yes     Comment: socially -- once every few  months  . Drug use: No  . Sexual activity: Not on file   Other Topics Concern  . Not on file   Social History Narrative   Epworth Sleepiness Scale = 10 (as of 12/01/14)   Review of Systems  Constitutional: Negative for fever and weight loss.  HENT: Negative for ear discharge, ear pain, hearing loss and tinnitus.   Eyes: Negative for blurred vision, double vision, photophobia and pain.  Respiratory: Negative for cough and shortness of breath.   Cardiovascular: Negative for chest pain and palpitations.  Gastrointestinal: Negative for abdominal pain, blood in stool, constipation, diarrhea, heartburn, melena, nausea and vomiting.  Genitourinary: Negative for dysuria, flank pain, frequency, hematuria and urgency.  Musculoskeletal: Negative for falls.  Neurological: Negative for dizziness, loss of consciousness and headaches.  Endo/Heme/Allergies: Negative for environmental allergies.  Psychiatric/Behavioral: Negative for depression, hallucinations, substance abuse and suicidal ideas. The patient is not nervous/anxious and does not have insomnia.    BP 122/88   Pulse 88   Temp 98.4 F (36.9 C) (Oral)   Resp 16   Ht 5' 8.5" (1.74 m)   Wt (!) 385 lb (174.6 kg)   SpO2 97%   BMI 57.69 kg/m   Physical Exam  Constitutional: He is oriented to person, place, and time and well-developed, well-nourished, and in no distress.  HENT:  Head: Normocephalic and atraumatic.  Right Ear: External ear normal.  Left Ear: External ear normal.  Nose: Nose normal.  Mouth/Throat: Oropharynx is clear and moist. No oropharyngeal exudate.  Eyes: Conjunctivae and EOM are normal. Pupils are equal, round, and reactive to light.  Neck: Neck supple. No thyromegaly present.  Cardiovascular: Normal rate, regular rhythm, normal heart sounds and intact distal pulses.   Pulmonary/Chest: Effort normal and breath sounds normal. No respiratory distress. He has no wheezes. He has no rales. He exhibits no tenderness.    Abdominal: Soft. Bowel sounds are normal. He exhibits no distension and no mass. There is no tenderness. There is no rebound and no guarding.  Genitourinary: Testes/scrotum normal.  Lymphadenopathy:    He has no cervical adenopathy.  Neurological: He is alert and oriented to person, place, and time.  Skin: Skin is warm and dry. No rash noted.  Psychiatric: Affect normal.  Vitals reviewed.  Recent Results (from the past 2160 hour(s))  Lipid Profile     Status: Abnormal   Collection Time: 02/18/16 11:38 AM  Result Value Ref Range   Cholesterol 209 (H) 0 - 200 mg/dL    Comment: ATP III Classification       Desirable:  < 200 mg/dL               Borderline High:  200 -  239 mg/dL          High:  > = 240 mg/dL   Triglycerides 171.0 (H) 0.0 - 149.0 mg/dL    Comment: Normal:  <150 mg/dLBorderline High:  150 - 199 mg/dL   HDL 34.30 (L) >39.00 mg/dL   VLDL 34.2 0.0 - 40.0 mg/dL   LDL Cholesterol 141 (H) 0 - 99 mg/dL   Total CHOL/HDL Ratio 6     Comment:                Men          Women1/2 Average Risk     3.4          3.3Average Risk          5.0          4.42X Average Risk          9.6          7.13X Average Risk          15.0          11.0                       NonHDL 175.04     Comment: NOTE:  Non-HDL goal should be 30 mg/dL higher than patient's LDL goal (i.e. LDL goal of < 70 mg/dL, would have non-HDL goal of < 100 mg/dL)  TSH     Status: None   Collection Time: 02/18/16 11:38 AM  Result Value Ref Range   TSH 3.12 0.35 - 4.50 uIU/mL  Comp Met (CMET)     Status: Abnormal   Collection Time: 02/18/16 11:38 AM  Result Value Ref Range   Sodium 137 135 - 145 mEq/L   Potassium 3.9 3.5 - 5.1 mEq/L   Chloride 100 96 - 112 mEq/L   CO2 30 19 - 32 mEq/L   Glucose, Bld 83 70 - 99 mg/dL   BUN 32 (H) 6 - 23 mg/dL   Creatinine, Ser 3.20 (H) 0.40 - 1.50 mg/dL   Total Bilirubin 0.5 0.2 - 1.2 mg/dL   Alkaline Phosphatase 101 39 - 117 U/L   AST 21 0 - 37 U/L   ALT 21 0 - 53 U/L   Total Protein 7.7  6.0 - 8.3 g/dL   Albumin 4.0 3.5 - 5.2 g/dL   Calcium 9.4 8.4 - 10.5 mg/dL   GFR 28.09 (L) >60.00 mL/min  Uric acid     Status: None   Collection Time: 02/18/16 11:38 AM  Result Value Ref Range   Uric Acid, Serum 6.4 4.0 - 7.8 mg/dL  CBC     Status: Abnormal   Collection Time: 02/18/16 11:38 AM  Result Value Ref Range   WBC 9.6 4.0 - 10.5 K/uL   RBC 6.04 (H) 4.22 - 5.81 Mil/uL   Platelets 350.0 150.0 - 400.0 K/uL   Hemoglobin 12.9 (L) 13.0 - 17.0 g/dL   HCT 41.2 39.0 - 52.0 %   MCV 68.2 (L) 78.0 - 100.0 fl   MCHC 31.3 30.0 - 36.0 g/dL   RDW 20.7 (H) 11.5 - 15.5 %  Hemoglobin A1c     Status: None   Collection Time: 02/18/16 11:38 AM  Result Value Ref Range   Hgb A1c MFr Bld 6.2 4.6 - 6.5 %    Comment: Glycemic Control Guidelines for People with Diabetes:Non Diabetic:  <6%Goal of Therapy: <7%Additional Action Suggested:  >8%    Assessment/Plan: Morbid obesity due to excess calories (  [place]) Body mass index is 57.69 kg/m. Patient is working on diet and exercise.  Nutritionist referral placed.   Gout Nephrology recently increased allopurinol to 300 mg daily. Will repeat uric acid today.   CKD (chronic kidney disease) stage 4, GFR 15-29 ml/min (HCC) Followed by Nephrology. Good output per patient. Taking medications as directed. BP stable. Labs are obtained today. FU with specialist as scheduled.   CHF (congestive heart failure) Followed by Cardiology. Overdue for follow-up. Patient to schedule appt with Dr. Oval Linsey. Euvolemic on examination today.  Continue diuretics as directed.   Benign hypertension with chronic kidney disease, stage IV (HCC) BP stable today. Repeat labs. Diet and exercise reviewed.  Annual physical exam Depression screen negative. Health Maintenance reviewed -- Immunizations up-to-date. Preventive schedule discussed and handout given in AVS. Will obtain fasting labs today.   Onychomycosis Referral to Podiatry placed.    Leeanne Rio, PA-C

## 2016-02-18 NOTE — Patient Instructions (Signed)
Please go to the lab for blood work.   Our office will call you with your results unless you have chosen to receive results via MyChart.  If your blood work is normal we will follow-up each year for physicals and as scheduled for chronic medical problems.  Please continue medications as directed by Nephrology for now.  Since they saw you a month ago and made changes, you need repeat labs today. I am adding on routine physical labs as well.  You need to call Cardiology today to schedule follow-up appointment with Dr. Oval Linsey. It is very important that you are consistent with follow-ups giving your heart failure.   You will be contacted by Podiatry and Nutrition for assessment.  If you note any swelling, difficulty breathing on medication regimen, please go to the ER.   Preventive Care 18-39 Years, Male Preventive care refers to lifestyle choices and visits with your health care provider that can promote health and wellness. What does preventive care include?  A yearly physical exam. This is also called an annual well check.  Dental exams once or twice a year.  Routine eye exams. Ask your health care provider how often you should have your eyes checked.  Personal lifestyle choices, including:  Daily care of your teeth and gums.  Regular physical activity.  Eating a healthy diet.  Avoiding tobacco and drug use.  Limiting alcohol use.  Practicing safe sex. What happens during an annual well check? The services and screenings done by your health care provider during your annual well check will depend on your age, overall health, lifestyle risk factors, and family history of disease. Counseling  Your health care provider may ask you questions about your:  Alcohol use.  Tobacco use.  Drug use.  Emotional well-being.  Home and relationship well-being.  Sexual activity.  Eating habits.  Work and work Statistician. Screening  You may have the following tests or  measurements:  Height, weight, and BMI.  Blood pressure.  Lipid and cholesterol levels. These may be checked every 5 years starting at age 28.  Diabetes screening. This is done by checking your blood sugar (glucose) after you have not eaten for a while (fasting).  Skin check.  Hepatitis C blood test.  Hepatitis B blood test.  Sexually transmitted disease (STD) testing. Discuss your test results, treatment options, and if necessary, the need for more tests with your health care provider. Vaccines  Your health care provider may recommend certain vaccines, such as:  Influenza vaccine. This is recommended every year.  Tetanus, diphtheria, and acellular pertussis (Tdap, Td) vaccine. You may need a Td booster every 10 years.  Varicella vaccine. You may need this if you have not been vaccinated.  HPV vaccine. If you are 86 or younger, you may need three doses over 6 months.  Measles, mumps, and rubella (MMR) vaccine. You may need at least one dose of MMR.You may also need a second dose.  Pneumococcal 13-valent conjugate (PCV13) vaccine. You may need this if you have certain conditions and have not been vaccinated.  Pneumococcal polysaccharide (PPSV23) vaccine. You may need one or two doses if you smoke cigarettes or if you have certain conditions.  Meningococcal vaccine. One dose is recommended if you are age 31-21 years and a first-year college student living in a residence hall, or if you have one of several medical conditions. You may also need additional booster doses.  Hepatitis A vaccine. You may need this if you have certain conditions or  if you travel or work in places where you may be exposed to hepatitis A.  Hepatitis B vaccine. You may need this if you have certain conditions or if you travel or work in places where you may be exposed to hepatitis B.  Haemophilus influenzae type b (Hib) vaccine. You may need this if you have certain risk factors. Talk to your health  care provider about which screenings and vaccines you need and how often you need them. This information is not intended to replace advice given to you by your health care provider. Make sure you discuss any questions you have with your health care provider. Document Released: 02/28/2001 Document Revised: 09/22/2015 Document Reviewed: 11/03/2014 Elsevier Interactive Patient Education  2017 [employer].

## 2016-02-18 NOTE — Progress Notes (Signed)
Pre visit review using our clinic review tool, if applicable. No additional management support is needed unless otherwise documented below in the visit note. 

## 2016-02-21 ENCOUNTER — Other Ambulatory Visit: Payer: Self-pay | Admitting: Physician Assistant

## 2016-02-21 DIAGNOSIS — E785 Hyperlipidemia, unspecified: Secondary | ICD-10-CM

## 2016-02-21 MED ORDER — ATORVASTATIN CALCIUM 10 MG PO TABS
10.0000 mg | ORAL_TABLET | Freq: Every day | ORAL | 3 refills | Status: DC
Start: 2016-02-21 — End: 2016-10-03

## 2016-02-21 NOTE — Assessment & Plan Note (Signed)
Nephrology recently increased allopurinol to 300 mg daily. Will repeat uric acid today.

## 2016-02-21 NOTE — Assessment & Plan Note (Addendum)
Followed by Cardiology. Overdue for follow-up. Patient to schedule appt with Dr. Oval Linsey. Euvolemic on examination today.  Continue diuretics as directed.

## 2016-02-21 NOTE — Assessment & Plan Note (Signed)
Body mass index is 57.69 kg/m. Patient is working on diet and exercise.  Nutritionist referral placed.

## 2016-02-21 NOTE — Assessment & Plan Note (Signed)
Followed by Nephrology. Good output per patient. Taking medications as directed. BP stable. Labs are obtained today. FU with specialist as scheduled.

## 2016-02-21 NOTE — Assessment & Plan Note (Signed)
>>  ASSESSMENT AND PLAN FOR BENIGN HYPERTENSION WITH CHRONIC KIDNEY DISEASE, STAGE IV (HCC) WRITTEN ON 02/21/2016 12:58 PM BY MARTIN, WILLIAM C, PA-C  BP stable today. Repeat labs. Diet and exercise reviewed.

## 2016-02-21 NOTE — Assessment & Plan Note (Signed)
BP stable today. Repeat labs. Diet and exercise reviewed.

## 2016-02-22 DIAGNOSIS — Z0000 Encounter for general adult medical examination without abnormal findings: Secondary | ICD-10-CM | POA: Insufficient documentation

## 2016-02-22 DIAGNOSIS — B351 Tinea unguium: Secondary | ICD-10-CM | POA: Insufficient documentation

## 2016-02-22 NOTE — Addendum Note (Signed)
Addended by: Raiford Noble on: 02/22/2016 03:11 PM   Modules accepted: Level of Service

## 2016-02-22 NOTE — Assessment & Plan Note (Signed)
Referral to Podiatry placed.  

## 2016-02-22 NOTE — Assessment & Plan Note (Signed)
Depression screen negative. Health Maintenance reviewed -- Immunizations up-to-date. Preventive schedule discussed and handout given in AVS. Will obtain fasting labs today.

## 2016-02-23 ENCOUNTER — Encounter: Payer: Self-pay | Admitting: Physician Assistant

## 2016-02-25 ENCOUNTER — Telehealth: Payer: Self-pay | Admitting: Physician Assistant

## 2016-02-25 ENCOUNTER — Other Ambulatory Visit: Payer: Self-pay | Admitting: Physician Assistant

## 2016-02-25 NOTE — Telephone Encounter (Signed)
**  Remind patient they can make refill requests via MyChart**  Medication refill request (Name & Dosage): Hydrazaline 100 mg    Preferred pharmacy (Name & Address): [facility] near [address] and [address]    Other comments (if applicable): Patient stresses that the pharmacy has been trying to contact your office for over a week and hasn't heard back from staff.  Says PA Einar Pheasant told him to call him if he has issues wth this prescription.   Blood pressure is very high.

## 2016-02-25 NOTE — Telephone Encounter (Signed)
Notified patient his rx was sent to the pharmacy with refills.

## 2016-03-02 ENCOUNTER — Ambulatory Visit: Payer: Self-pay | Admitting: Dietician

## 2016-03-02 ENCOUNTER — Ambulatory Visit: Payer: Managed Care, Other (non HMO) | Admitting: Podiatry

## 2016-03-16 ENCOUNTER — Ambulatory Visit: Payer: Managed Care, Other (non HMO) | Admitting: Podiatry

## 2016-03-16 ENCOUNTER — Other Ambulatory Visit: Payer: Self-pay | Admitting: Physician Assistant

## 2016-03-16 DIAGNOSIS — I10 Essential (primary) hypertension: Secondary | ICD-10-CM

## 2016-03-21 ENCOUNTER — Encounter: Payer: Managed Care, Other (non HMO) | Attending: Physician Assistant | Admitting: Dietician

## 2016-03-21 ENCOUNTER — Encounter: Payer: Self-pay | Admitting: Dietician

## 2016-03-21 DIAGNOSIS — Z6841 Body Mass Index (BMI) 40.0 and over, adult: Secondary | ICD-10-CM | POA: Diagnosis not present

## 2016-03-21 DIAGNOSIS — Z713 Dietary counseling and surveillance: Secondary | ICD-10-CM | POA: Diagnosis present

## 2016-03-21 DIAGNOSIS — M1039 Gout due to renal impairment, multiple sites: Secondary | ICD-10-CM

## 2016-03-21 DIAGNOSIS — I509 Heart failure, unspecified: Secondary | ICD-10-CM

## 2016-03-21 NOTE — Progress Notes (Signed)
Medical Nutrition Therapy:  Appt start time: 0800 end time:  0900.   Assessment:  Primary concerns today: Patient is here alone.  He is trying to lose weight and is struggling with this.  Other hx includes cardiomyopathy, nonischemic CHF, CKD stage IV, HTN, gout, hyperlipidemia, OSA and uses C-pap except when he travels out of town about once per month. Labs include BUN 32, Creatinine 3.2, Potassium 3.9, GFR 28, Alk Phos 101, Cholesterol 209, HDL 34, LDL 141, A1C 6.2%  (02/18/16)  Patient lives with his wife and 3 children (79,8, and 66 yo).  He has 3 other children that do not live with him.  His wife does the shopping and cooking.  Patient works for [employer].  He states that he gets a lot of exercise with this position.  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  He travels out of town once per month.  TANITA  BODY COMP RESULTS 03/21/16 388.4 lbs   BMI (kg/m^2) 59.1   Fat Mass (lbs) 226.8   Fat Free Mass (lbs) 161.6   Total Body Water (lbs)     Preferred Learning Style:   No preference indicated   Learning Readiness:   Ready  MEDICATIONS: see list   DIETARY INTAKE: "There is no consistency to my eating but usually do not eat until 2 pm." Usual eating pattern includes 2 meals and 0 snacks per day. Avoided foods include added salt in cooking and at the table but has high sodium in foods eaten out and some processed meat particularly bacon.    24-hr recall:  B ( AM): none  Snk ( AM): none  L (2 PM): [employer] Snk ( PM): none D (9 PM): "Southern Style"  That wife cooks.  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   Nutritional Diagnosis:  NB-1.1 Food and nutrition-related knowledge deficit As related to healthy eating for weight reduction.  As evidenced by patient report and diet hx.    Intervention:  Nutrition counseling/education regarding healthy eating.  Discussed eating out and healthier options that are lower in sodium and fat, discussed choosing lean meats, avoiding saturated fat and process meats.  Discussed the benefits of exercise and choosing beverages without added sugar.  Also discussed foods high in purine that can cause gout to flare.  Discussed basics of insulin resistance and prediabetes  Rethink what you are drinking.  Choose unsweetened tea and more water. Breakfast, lunch, and dinner daily   Lean meats.  Chicken and fish more often than beef. Bake, broil or grill rather than fried. Consider using the Calorie [employer] Pace app to evaluate foods eaten out.  Limit sodium to 2000 mg per day. Eat more vegetables and fresh fruit. Continue to stay active. Patient states that he plans on purchasing a bike after work today.  Teaching Method Utilized:  Visual Auditory Hands on  Handouts given during visit include:  Snack list  My plate  Meal plan card  Cholesterol and triglycerides  Low purine diet  Breakfast ideas  Barriers to learning/adherence to lifestyle change: multiple  changes  Demonstrated degree of understanding via:  Teach Back   Monitoring/Evaluation:  Dietary intake, exercise, label reading, and body weight in 1 month(s).

## 2016-03-21 NOTE — Patient Instructions (Addendum)
Rethink what you are drinking.  Choose unsweetened tea and more water. Breakfast, lunch, and dinner daily   Lean meats.  Chicken and fish more often than beef. Bake, broil or grill rather than fried. Consider using the Calorie [employer] Pace app to evaluate foods eaten out.  Limit sodium to 2000 mg per day. Eat more vegetables and fresh fruit. Continue to stay active.

## 2016-04-24 ENCOUNTER — Ambulatory Visit: Payer: Self-pay | Admitting: Dietician

## 2016-05-08 ENCOUNTER — Encounter: Payer: Managed Care, Other (non HMO) | Attending: Physician Assistant | Admitting: Dietician

## 2016-05-08 DIAGNOSIS — Z6841 Body Mass Index (BMI) 40.0 and over, adult: Secondary | ICD-10-CM | POA: Diagnosis not present

## 2016-05-08 DIAGNOSIS — Z713 Dietary counseling and surveillance: Secondary | ICD-10-CM | POA: Insufficient documentation

## 2016-05-08 NOTE — Patient Instructions (Signed)
Great job on the changes that you have made!!

## 2016-05-08 NOTE — Progress Notes (Signed)
Medical Nutrition Therapy:  Appt start time: 1200 end time:  1230.   Assessment:  Primary concerns today: Patient is here alone.  He is trying to lose weight and is struggling with this.  Other hx includes cardiomyopathy, nonischemic CHF, CKD stage IV, HTN, gout, hyperlipidemia, OSA and uses C-pap except when he travels out of town about once per month. Labs include BUN 32, Creatinine 3.2, Potassium 3.9, GFR 28, Alk Phos 101, Cholesterol 209, HDL 34, LDL 141, A1C 6.2%  (38/2/18)  Patient lives with his wife and 3 children (38,8, and 27 yo).  He has 3 other children that do not live with him.  His wife does the shopping and cooking.  Patient works for [employer].  He states that he gets a lot of exercise with this position.  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  He travels out of town once per month.  TANITA  BODY COMP RESULTS 03/21/16 388.4 lbs   BMI (kg/m^2) 59.1   Fat Mass (lbs) 226.8   Fat Free Mass (lbs) 161.6   Total Body Water (lbs)    05/08/16: Patient is here today alone.  He has lost 11 lbs since last admit and states that he had an appointment with the nephrologist yesterday and his blood pressure was normal.  He states that after the last appointment, he bought a bike and began exercising 30 minutes most days (walking, biking).  His whole family now bikes at [facility].  His wife took the information from last visit and began making changes to the family meals.  His wife lost weight as well.  He also stopped drinking sugar and cut out sweet tea and regular soda.  TANITA  BODY COMP RESULTS 05/08/16 377.2 lbs   BMI (kg/m^2) 57.4   Fat Mass (lbs) error   Fat Free Mass (lbs) error   Total Body Water (lbs) error   Preferred Learning Style:   No preference indicated   Learning Readiness:   Ready  MEDICATIONS: see list   DIETARY INTAKE: "There is no consistency to my eating but usually do not eat until 2 pm." Usual eating pattern includes 2 meals and 0 snacks per  day. Avoided foods include added salt in cooking and at the table but has high sodium in foods eaten out and some processed meat particularly bacon.    24-hr recall:  B ( AM): none  Snk ( AM): none  L (2 PM): [employer] Snk ( PM): none D (9 PM): "Southern Style"  That wife cooks.  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   Nutritional Diagnosis:  NB-1.1 Food and nutrition-related knowledge deficit As related to healthy eating for weight reduction.  As evidenced by patient report and diet hx.    Intervention:  Nutrition counseling/education regarding healthy eating.  Discussed eating out and healthier options that are lower in sodium and fat, discussed choosing lean meats, avoiding saturated fat and process meats.  Discussed the benefits of exercise and choosing beverages without added sugar.  Also discussed foods high in purine that  can cause gout to flare.  Discussed basics of insulin resistance and prediabetes  Rethink what you are drinking.  Choose unsweetened tea and more water. Breakfast, lunch, and dinner daily   Lean meats.  Chicken and fish more often than beef. Bake, broil or grill rather than fried. Consider using the Calorie [employer] Pace app to evaluate foods eaten out.  Limit sodium to 2000 mg per day. Eat more vegetables and fresh fruit. Continue to stay active. Patient states that he plans on purchasing a bike after work today.  Teaching Method Utilized:  Visual Auditory Hands on  Handouts given during visit include:  Snack list  My plate  Meal plan card  Cholesterol and  triglycerides  Low purine diet  Breakfast ideas  Barriers to learning/adherence to lifestyle change: multiple changes  Demonstrated degree of understanding via:  Teach Back   Monitoring/Evaluation:  Dietary intake, exercise, label reading, and body weight in 6 weeks.

## 2016-06-19 ENCOUNTER — Encounter: Payer: Managed Care, Other (non HMO) | Attending: Physician Assistant | Admitting: Dietician

## 2016-06-19 ENCOUNTER — Encounter: Payer: Self-pay | Admitting: Dietician

## 2016-06-19 DIAGNOSIS — Z6841 Body Mass Index (BMI) 40.0 and over, adult: Secondary | ICD-10-CM | POA: Diagnosis not present

## 2016-06-19 DIAGNOSIS — Z713 Dietary counseling and surveillance: Secondary | ICD-10-CM | POA: Insufficient documentation

## 2016-06-19 DIAGNOSIS — N184 Chronic kidney disease, stage 4 (severe): Secondary | ICD-10-CM

## 2016-06-19 DIAGNOSIS — I129 Hypertensive chronic kidney disease with stage 1 through stage 4 chronic kidney disease, or unspecified chronic kidney disease: Secondary | ICD-10-CM

## 2016-06-19 NOTE — Progress Notes (Signed)
Medical Nutrition Therapy:  Appt start time: 1200 end time:  1230.   Assessment:  Primary concerns today: Patient is here alone.  He is trying to lose weight and is struggling with this.  Other hx includes cardiomyopathy, nonischemic CHF, CKD stage IV, HTN, gout, hyperlipidemia, OSA and uses C-pap except when he travels out of town about once per month. Labs include BUN 32, Creatinine 3.2, Potassium 3.9, GFR 28, Alk Phos 101, Cholesterol 209, HDL 34, LDL 141, A1C 6.2%  (02/18/16)  Patient lives with his wife and 3 children (8,8, and 38 yo).  He has 3 other children that do not live with him.  His wife does the shopping and cooking.  Patient works for [employer].  He states that he gets a lot of exercise with this position.  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  He travels out of town once per month.  TANITA  BODY COMP RESULTS 03/21/16 388.4 lbs   BMI (kg/m^2) 59.1   Fat Mass (lbs) 226.8   Fat Free Mass (lbs) 161.6   Total Body Water (lbs)    05/08/16: Patient is here today alone.  He has lost 11 lbs since last admit and states that he had an appointment with the nephrologist yesterday and his blood pressure was normal.  He states that after the last appointment, he bought a bike and began exercising 30 minutes most days (walking, biking).  His whole family now bikes at [facility].  His wife took the information from last visit and began making changes to the family meals.  His wife lost weight as well.  He also stopped drinking sugar and cut out sweet tea and regular soda.  TANITA  BODY COMP RESULTS 05/08/16 377.2 lbs   BMI (kg/m^2) 57.4   Fat Mass (lbs) error   Fat Free Mass (lbs) error   Total Body Water (lbs) error   06/19/16: Patient is here alone.  He has lost 32 lbs in the past 3 months.  He continues to stay physically active.  He states that his gout is better and he is not needing to take medication for this now.  He is dating a woman and states that she makes him more  motivated. He is now eating breakfast.  He still is eating out most meals.  TANITA  BODY COMP RESULTS 06/19/16 356 lbs   BMI (kg/m^2) 54.1   Fat Mass (lbs) 188   Fat Free Mass (lbs) 168   Total Body Water (lbs) error    Preferred Learning Style:   No preference indicated   Learning Readiness:   Ready  MEDICATIONS: see list   DIETARY INTAKE: "There is no consistency to my eating but usually do not eat until 2 pm." Usual eating pattern includes 2 meals and 0 snacks per day. Avoided foods include added salt in cooking and at the table but has high sodium in foods eaten out and some processed meat particularly bacon.    24-hr recall:  B ( AM): oatmeal Snk ( AM): none  L (2 PM): sandwich Snk ( PM): none D (9 PM): subway or egg white delight Snk ( PM): none Beverages:  Used to drink sweet tea, regular soda (2-2 1/2 L per week) but limit due to "it bothers gout", water often with flavor packs gave up soda after first visit due to the amount of sugar in it.    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  Estimated energy needs: 2200 calories 248 g carbohydrates 165 g protein 61 g fat  Progress Towards Goal(s):  In progress.   Nutritional Diagnosis:  NB-1.1 Food and nutrition-related knowledge deficit As related to healthy eating for weight reduction.  As evidenced by patient report and diet hx.    Intervention:  Nutrition counseling/education regarding healthy eating.  Discussed eating out and healthier options that are lower in sodium and fat, discussed choosing lean meats, avoiding saturated fat and process meats.  Discussed the benefits of exercise and choosing beverages without added sugar.  Also discussed foods high in purine that can cause gout to flare.  Discussed basics of insulin resistance and  prediabetes  Rethink what you are drinking.  Choose unsweetened tea and more water. Breakfast, lunch, and dinner daily   Lean meats.  Chicken and fish more often than beef. Bake, broil or grill rather than fried. Consider using the Calorie [employer] Pace app to evaluate foods eaten out.  Limit sodium to 2000 mg per day. Eat more vegetables and fresh fruit. Continue to stay active. Patient states that he plans on purchasing a bike after work today.  Teaching Method Utilized:  Visual Auditory Hands on  Handouts given during initial visit include:  Snack list  My plate  Meal plan card  Cholesterol and triglycerides  Low purine diet  Breakfast ideas  Barriers to learning/adherence to lifestyle change: multiple changes  Demonstrated degree of understanding via:  Teach Back   Monitoring/Evaluation:  Dietary intake, exercise, label reading, and body weight in 2 months.

## 2016-06-19 NOTE — Patient Instructions (Signed)
Great job on the changes that you have made!  Continue   Increasing your activity.  Changing your beverages.  Changing and decreasing your meat. Increase your vegetable intake.

## 2016-07-05 ENCOUNTER — Other Ambulatory Visit: Payer: Self-pay | Admitting: Physician Assistant

## 2016-07-05 DIAGNOSIS — I10 Essential (primary) hypertension: Secondary | ICD-10-CM

## 2016-07-14 ENCOUNTER — Encounter (HOSPITAL_BASED_OUTPATIENT_CLINIC_OR_DEPARTMENT_OTHER): Payer: Self-pay | Admitting: Emergency Medicine

## 2016-07-14 ENCOUNTER — Emergency Department (HOSPITAL_BASED_OUTPATIENT_CLINIC_OR_DEPARTMENT_OTHER)
Admission: EM | Admit: 2016-07-14 | Discharge: 2016-07-14 | Disposition: A | Discharge status: Home / Self Care | Payer: Managed Care, Other (non HMO) | Attending: Emergency Medicine | Admitting: Emergency Medicine

## 2016-07-14 ENCOUNTER — Emergency Department (HOSPITAL_BASED_OUTPATIENT_CLINIC_OR_DEPARTMENT_OTHER): Payer: Managed Care, Other (non HMO)

## 2016-07-14 DIAGNOSIS — M10071 Idiopathic gout, right ankle and foot: Secondary | ICD-10-CM | POA: Diagnosis not present

## 2016-07-14 DIAGNOSIS — W010XXA Fall on same level from slipping, tripping and stumbling without subsequent striking against object, initial encounter: Secondary | ICD-10-CM | POA: Insufficient documentation

## 2016-07-14 DIAGNOSIS — E669 Obesity, unspecified: Secondary | ICD-10-CM | POA: Insufficient documentation

## 2016-07-14 DIAGNOSIS — I509 Heart failure, unspecified: Secondary | ICD-10-CM | POA: Diagnosis not present

## 2016-07-14 DIAGNOSIS — N184 Chronic kidney disease, stage 4 (severe): Secondary | ICD-10-CM | POA: Diagnosis not present

## 2016-07-14 DIAGNOSIS — I1310 Hypertensive heart and chronic kidney disease without heart failure, with stage 1 through stage 4 chronic kidney disease, or unspecified chronic kidney disease: Secondary | ICD-10-CM | POA: Diagnosis not present

## 2016-07-14 DIAGNOSIS — S99911A Unspecified injury of right ankle, initial encounter: Secondary | ICD-10-CM | POA: Diagnosis present

## 2016-07-14 DIAGNOSIS — Y929 Unspecified place or not applicable: Secondary | ICD-10-CM | POA: Insufficient documentation

## 2016-07-14 DIAGNOSIS — Y9339 Activity, other involving climbing, rappelling and jumping off: Secondary | ICD-10-CM | POA: Insufficient documentation

## 2016-07-14 DIAGNOSIS — Y999 Unspecified external cause status: Secondary | ICD-10-CM | POA: Diagnosis not present

## 2016-07-14 DIAGNOSIS — M21961 Unspecified acquired deformity of right lower leg: Secondary | ICD-10-CM

## 2016-07-14 DIAGNOSIS — Z79899 Other long term (current) drug therapy: Secondary | ICD-10-CM | POA: Diagnosis not present

## 2016-07-14 MED ORDER — TRAMADOL HCL 50 MG PO TABS: 50.0000 mg | ORAL_TABLET | Freq: Two times a day (BID) | ORAL | 0 refills | Status: DC | PRN

## 2016-07-14 MED FILL — traMADol HCL 50 MG TABS: 50 | 7 days supply | Qty: 15 | Fill #0

## 2016-07-14 NOTE — ED Notes (Signed)
Pt refused posterior fiberglass splint due to having to be able to drive. Largest ASO too small for pt. MD made aware and ACE wrap applied.

## 2016-07-14 NOTE — ED Notes (Signed)
Largest Cam Walker available too small for pt. MD made aware and asked for a posterior splint in placed of the cam walker.

## 2016-07-14 NOTE — ED Provider Notes (Addendum)
[facility] Provider Note   CSN: [id number] Arrival date & time: 07/14/16  0756     History   Chief Complaint Chief Complaint  Patient presents with  . Ankle Pain    HPI Frank Coffey is a 36 y.o. male.  HPI Patient presents to the emergency room with complaints of right ankle pain.  Patient has history of morbid obesity. He recalls jumping off the back of truck the other day but did not experience any pain or discomfort. He does not think that is necessarily related to his ankle pain today. Yesterday he started developing pain in his right ankle. This morning the pain is more severe.  He does have history of gout but has not had his ankle before. He denies any trouble with fevers or chills. No vomiting or diarrhea. No shortness of breath Past Medical History:  Diagnosis Date  . CHF (congestive heart failure) ([place])   . Chronic kidney disease, stage 3, mod decreased GFR   . Gout   . 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 Hx of migraine headaches   . Hyperlipidemia   . Hypertension   . Hypertensive heart disease with congestive heart failure and stage 3 kidney disease ([place])   . Obesity   . OSA (obstructive sleep apnea)     Patient Active Problem List   Diagnosis Date Noted  . Annual physical exam 02/22/2016  . Onychomycosis 02/22/2016  . Pre-operative cardiovascular examination 07/19/2015  . NICM (nonischemic cardiomyopathy) ([place]) 07/19/2015  . Morbid obesity due to excess calories ([place]) 07/19/2015  . 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 CHF (congestive heart failure) ([place]) 12/19/2012  . Benign hypertension with chronic kidney disease, stage IV ([place]) 12/19/2012  . Gout 12/19/2012  . CKD (chronic kidney disease) stage 4, GFR 15-29 ml/min ([facility]) 12/19/2012  . OSA (obstructive sleep apnea) 12/19/2012    Past Surgical History:  Procedure Laterality Date  . CHOLECYSTECTOMY    . RETINAL DETACHMENT REPAIR W/ SCLERAL BUCKLE LE     Left        Home Medications    Prior to Admission medications   Medication Sig Start Date End Date Taking? Authorizing Provider  allopurinol (ZYLOPRIM) 100 MG tablet Take 1 tablet (100 mg total) by mouth daily. Patient not taking: Reported on 06/19/2016 01/11/16   Brunetta Jeans, PA-C  ARTIFICIAL TEARS 0.1-0.3 % SOLN Place 1 drop into the left eye daily as needed (for dry eyes).     [provider]  atorvastatin (LIPITOR) 10 MG tablet Take 1 tablet (10 mg total) by mouth daily. 02/21/16   Brunetta Jeans, PA-C  carvedilol (COREG) 25 MG tablet TAKE 1 TABLET BY MOUTH TWICE DAILY WITH A MEAL 07/05/16   Brunetta Jeans, PA-C  EQ FIBER SUPPLEMENT PO Take 1 tablet by mouth daily.     [provider]  hydrALAZINE (APRESOLINE) 100 MG tablet TAKE 1 TABLET BY MOUTH THREE TIMES DAILY 02/25/16   Brunetta Jeans, PA-C  LINZESS 145 MCG CAPS capsule Take 1 capsule by mouth daily as needed. 05/03/15   [provider]  spironolactone (ALDACTONE) 25 MG tablet Take 1 tablet (25 mg total) by mouth daily. Patient taking differently: Take 25 mg by mouth 2 (two) times daily.  01/11/16   Brunetta Jeans, PA-C  torsemide (DEMADEX) 20 MG tablet Take 1 tablet (20 mg total) by mouth 2 (two) times daily. 02/07/16   Brunetta Jeans, PA-C  traMADol Veatrice Bourbon) 50  MG tablet Take 1 tablet (50 mg total) by mouth every 12 (twelve) hours as needed for severe pain. 07/14/16   Dorie Rank, MD  UNABLE TO FIND CPAP Use nightly for OSA    [provider]    Family History Family History  Problem Relation Age of Onset  . Hypertension Mother   . Hyperlipidemia Mother   . Heart disease Mother   . Hypertension Maternal Grandmother   . Stroke Maternal Grandmother   . Heart disease Maternal Grandmother   . Hyperlipidemia Maternal Grandmother   . Stroke Maternal Grandfather   . Hypertension Maternal Grandfather   . Heart disease Maternal Grandfather   . Hyperlipidemia Maternal Grandfather   .  Alzheimer's disease Maternal Grandfather   . Cancer - Other Paternal Grandmother   . Healthy Brother        x2  . Healthy Sister        x2  . Healthy Son        x2  . Healthy Daughter        x1  . Diabetes Neg Hx   . Heart attack Neg Hx   . Sudden death Neg Hx     Social History Social History  Substance Use Topics  . Smoking status: Never Smoker  . Smokeless tobacco: Never Used  . Alcohol use Yes     Comment: socially -- once every few months     Allergies   Patient has no known allergies.   Review of Systems Review of Systems  All other systems reviewed and are negative.    Physical Exam Updated Vital Signs BP (!) 178/98 (BP Location: Right Arm)   Pulse 80   Temp 99.1 F (37.3 C) (Oral)   Resp 18   Wt (!) 157.9 kg (348 lb)   SpO2 96%   BMI 52.14 kg/m   Physical Exam  Constitutional: He appears well-developed and well-nourished. No distress.  Morbidly obese  HENT:  Head: Normocephalic and atraumatic.  Right Ear: External ear normal.  Left Ear: External ear normal.  Eyes: Conjunctivae are normal. Right eye exhibits no discharge. Left eye exhibits no discharge. No scleral icterus.  Neck: Neck supple. No tracheal deviation present.  Cardiovascular: Normal rate, regular rhythm and normal heart sounds.   Pulmonary/Chest: Effort normal and breath sounds normal. No stridor. No respiratory distress. He has no wheezes.  Abdominal: He exhibits no distension.  Musculoskeletal: He exhibits tenderness. He exhibits no edema.       Right knee: Normal.       Right ankle: He exhibits no swelling. Tenderness. Lateral malleolus and medial malleolus tenderness found. Achilles tendon normal.       Right lower leg: Normal.  Neurological: He is alert. Cranial nerve deficit: no gross deficits.  Skin: Skin is warm and dry. No rash noted.  Psychiatric: He has a normal mood and affect.  Nursing note and vitals reviewed.    ED Treatments / Results    Radiology Dg Ankle  Complete Right  Result Date: 07/14/2016 CLINICAL DATA:  Right ankle pain and swelling after recent injury EXAM: RIGHT ANKLE - COMPLETE 3+ VIEW COMPARISON:  01/17/2013 right foot radiographs FINDINGS: Mild diffuse right ankle soft tissue swelling. Focal subchondral lucency in the medial right talar dome. Otherwise no fracture. No subluxation. No suspicious focal osseous lesion. Small Achilles and plantar right calcaneal spurs. Mild degenerative changes in the dorsal tarsal joints. IMPRESSION: 1. Focal subchondral lucency in the medial right talar dome, which could represent  an osteochondral injury of uncertain chronicity. Right ankle MRI may be obtained for further characterization, as clinically warranted. 2. Otherwise no right ankle fracture or subluxation. Electronically Signed   By: Ilona Sorrel M.D.   On: 07/14/2016 08:55    Procedures Procedures (including critical care time) Cam walker does not fit.  Pt refused posterior splint.  Medications Ordered in ED Medications - No data to display   Initial Impression / Assessment and Plan / ED Course  I have reviewed the triage vital signs and the nursing notes.  Pertinent labs & imaging results that were available during my care of the patient were reviewed by me and considered in my medical decision making (see chart for details).    Patient's x-rays show an abnormality to the right talar dome. Patient is morbidly obese. He did have an axial load when he jumped off the back of the truck. It's possible this did result in a slight injury. Gout is another possibility.  We'll place the patient in a splint. Use crutches. Follow-up with sports medicine orthopedics. Ultram as needed for pain because he has chronic renal insufficiency. He'll also contact over-the-counter Tylenol. Final Clinical Impressions(s) / ED Diagnoses   Final diagnoses:  Deformity of right talus    New Prescriptions New Prescriptions   TRAMADOL (ULTRAM) 50 MG TABLET    Take  1 tablet (50 mg total) by mouth every 12 (twelve) hours as needed for severe pain.       Dorie Rank, MD 07/14/16 [id number]

## 2016-07-14 NOTE — ED Notes (Signed)
ED Provider at bedside. 

## 2016-07-14 NOTE — ED Notes (Signed)
Patient transported to X-ray 

## 2016-07-14 NOTE — Discharge Instructions (Signed)
Take the medications as needed for severe pain. He can also take over-the-counter Tylenol. Use the crutches and splint. Stay off your ankle. Follow up with the sports medicine doctor or an orthopedic doctor for further evaluation

## 2016-07-14 NOTE — ED Triage Notes (Signed)
R ankle pain with swelling since yesterday, no injury.

## 2016-08-21 ENCOUNTER — Ambulatory Visit: Payer: Self-pay | Admitting: Dietician

## 2016-08-26 ENCOUNTER — Emergency Department (HOSPITAL_COMMUNITY): Payer: Managed Care, Other (non HMO)

## 2016-08-26 ENCOUNTER — Emergency Department (HOSPITAL_COMMUNITY)
Admission: EM | Admit: 2016-08-26 | Discharge: 2016-08-26 | Disposition: A | Discharge status: Home / Self Care | Payer: Managed Care, Other (non HMO) | Attending: Emergency Medicine | Admitting: Emergency Medicine

## 2016-08-26 ENCOUNTER — Encounter (HOSPITAL_COMMUNITY): Payer: Self-pay

## 2016-08-26 DIAGNOSIS — J0110 Acute frontal sinusitis, unspecified: Secondary | ICD-10-CM | POA: Insufficient documentation

## 2016-08-26 DIAGNOSIS — I130 Hypertensive heart and chronic kidney disease with heart failure and stage 1 through stage 4 chronic kidney disease, or unspecified chronic kidney disease: Secondary | ICD-10-CM | POA: Diagnosis not present

## 2016-08-26 DIAGNOSIS — I509 Heart failure, unspecified: Secondary | ICD-10-CM | POA: Diagnosis not present

## 2016-08-26 DIAGNOSIS — N183 Chronic kidney disease, stage 3 (moderate): Secondary | ICD-10-CM | POA: Diagnosis not present

## 2016-08-26 DIAGNOSIS — I110 Hypertensive heart disease with heart failure: Secondary | ICD-10-CM | POA: Insufficient documentation

## 2016-08-26 DIAGNOSIS — R509 Fever, unspecified: Secondary | ICD-10-CM | POA: Diagnosis present

## 2016-08-26 DIAGNOSIS — Z79899 Other long term (current) drug therapy: Secondary | ICD-10-CM | POA: Diagnosis not present

## 2016-08-26 LAB — COMPREHENSIVE METABOLIC PANEL
ALT: 36 U/L (ref 17–63)
ANION GAP: 12 (ref 5–15)
AST: 44 U/L — ABNORMAL HIGH (ref 15–41)
Albumin: 3.8 g/dL (ref 3.5–5.0)
Alkaline Phosphatase: 119 U/L (ref 38–126)
BUN: 18 mg/dL (ref 6–20)
CHLORIDE: 101 mmol/L (ref 101–111)
CO2: 27 mmol/L (ref 22–32)
Calcium: 9 mg/dL (ref 8.9–10.3)
Creatinine, Ser: 3.16 mg/dL — ABNORMAL HIGH (ref 0.61–1.24)
GFR calc non Af Amer: 23 mL/min — ABNORMAL LOW (ref >60)
GFR, EST AFRICAN AMERICAN: 27 mL/min — AB (ref >60)
Glucose, Bld: 97 mg/dL (ref 65–99)
POTASSIUM: 3.2 mmol/L — AB (ref 3.5–5.1)
Sodium: 140 mmol/L (ref 135–145)
Total Bilirubin: 1.2 mg/dL (ref 0.3–1.2)
Total Protein: 8.1 g/dL (ref 6.5–8.1)

## 2016-08-26 LAB — CBC WITH DIFFERENTIAL/PLATELET
Basophils Absolute: 0 10*3/uL (ref 0.0–0.1)
Basophils Relative: 0 %
EOS PCT: 0 %
Eosinophils Absolute: 0 10*3/uL (ref 0.0–0.7)
HEMATOCRIT: 45.5 % (ref 39.0–52.0)
Hemoglobin: 14.2 g/dL (ref 13.0–17.0)
Lymphocytes Relative: 7 %
Lymphs Abs: 1.5 10*3/uL (ref 0.7–4.0)
MCH: 21.7 pg — ABNORMAL LOW (ref 26.0–34.0)
MCHC: 31.2 g/dL (ref 30.0–36.0)
MCV: 69.6 fL — AB (ref 78.0–100.0)
MONO ABS: 0.9 10*3/uL (ref 0.1–1.0)
MONOS PCT: 4 %
NEUTROS PCT: 89 %
Neutro Abs: 19.5 10*3/uL — ABNORMAL HIGH (ref 1.7–7.7)
PLATELETS: 295 10*3/uL (ref 150–400)
RBC: 6.54 MIL/uL — AB (ref 4.22–5.81)
RDW: 19.6 % — ABNORMAL HIGH (ref 11.5–15.5)
WBC: 21.9 10*3/uL — AB (ref 4.0–10.5)

## 2016-08-26 LAB — I-STAT CG4 LACTIC ACID, ED: LACTIC ACID, VENOUS: 2.83 mmol/L — AB (ref 0.5–1.9)

## 2016-08-26 MED ORDER — ACETAMINOPHEN 325 MG PO TABS
ORAL_TABLET | ORAL | Status: AC
  Filled 2016-08-26: qty 2

## 2016-08-26 MED ORDER — AMOXICILLIN-POT CLAVULANATE 875-125 MG PO TABS: 1.0000 | ORAL_TABLET | Freq: Two times a day (BID) | ORAL | 0 refills | Status: AC

## 2016-08-26 MED ORDER — ACETAMINOPHEN 325 MG PO TABS
650.0000 mg | ORAL_TABLET | Freq: Once | ORAL | Status: AC | PRN
  Administered 2016-08-26: 650 mg via ORAL

## 2016-08-26 NOTE — ED Provider Notes (Signed)
[facility] DEPT Provider Note   CSN: [id number] Arrival date & time: 08/26/16  1219     History   Chief Complaint Chief Complaint  Patient presents with  . Fever    HPI Frank Coffey is a 36 y.o. male.  36 yo M with a chief complaint of fevers and chills. The started this morning. He denies any other symptoms. He has had an ongoing issue with his left lower extremity. He attributes to gout. Has pain that comes and goes down his entire left leg. Denies low back pain denies abdominal pain denies vomiting denies diarrhea denies cough congestion.   The history is provided by the patient.  Fever   This is a new problem. The current episode started 6 to 12 hours ago. The problem occurs constantly. The problem has not changed since onset.Pertinent negatives include no chest pain, no diarrhea, no vomiting, no congestion and no headaches. He has tried nothing for the symptoms. The treatment provided no relief.    Past Medical History:  Diagnosis Date  . CHF (congestive heart failure) ([place])   . Chronic kidney disease, stage 3, mod decreased GFR   . Gout   . 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 Hx of migraine headaches   . Hyperlipidemia   . Hypertension   . 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 Obesity   . OSA (obstructive sleep apnea)     Patient Active Problem List   Diagnosis Date Noted  . Annual physical exam 02/22/2016  . Onychomycosis 02/22/2016  . Pre-operative cardiovascular examination 07/19/2015  . NICM (nonischemic cardiomyopathy) ([place]) 07/19/2015  . Morbid obesity due to excess calories ([place]) 07/19/2015  . 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 CHF (congestive heart failure) ([place]) 12/19/2012  . Benign hypertension with chronic kidney disease, stage IV ([place]) 12/19/2012  . Gout 12/19/2012  . CKD (chronic kidney disease) stage 4, GFR 15-29 ml/min ([facility]) 12/19/2012  . OSA  (obstructive sleep apnea) 12/19/2012    Past Surgical History:  Procedure Laterality Date  . CHOLECYSTECTOMY    . RETINAL DETACHMENT REPAIR W/ SCLERAL BUCKLE LE     Left       Home Medications    Prior to Admission medications   Medication Sig Start Date End Date Taking? Authorizing Provider  allopurinol (ZYLOPRIM) 100 MG tablet Take 1 tablet (100 mg total) by mouth daily. Patient not taking: Reported on 06/19/2016 01/11/16   Brunetta Jeans, PA-C  amoxicillin-clavulanate (AUGMENTIN) 875-125 MG tablet Take 1 tablet by mouth 2 (two) times daily. 08/26/16 09/09/16  Deno Etienne, DO  ARTIFICIAL TEARS 0.1-0.3 % SOLN Place 1 drop into the left eye daily as needed (for dry eyes).     [provider]  atorvastatin (LIPITOR) 10 MG tablet Take 1 tablet (10 mg total) by mouth daily. 02/21/16   Brunetta Jeans, PA-C  carvedilol (COREG) 25 MG tablet TAKE 1 TABLET BY MOUTH TWICE DAILY WITH A MEAL 07/05/16   Brunetta Jeans, PA-C  EQ FIBER SUPPLEMENT PO Take 1 tablet by mouth daily.     [provider]  hydrALAZINE (APRESOLINE) 100 MG tablet TAKE 1 TABLET BY MOUTH THREE TIMES DAILY 02/25/16   Brunetta Jeans, PA-C  LINZESS 145 MCG CAPS capsule Take 1 capsule by mouth daily as needed. 05/03/15   [provider]  spironolactone (ALDACTONE) 25 MG tablet Take 1 tablet (25 mg total) by mouth daily. Patient  taking differently: Take 25 mg by mouth 2 (two) times daily.  01/11/16   Brunetta Jeans, PA-C  torsemide (DEMADEX) 20 MG tablet Take 1 tablet (20 mg total) by mouth 2 (two) times daily. 02/07/16   Brunetta Jeans, PA-C  traMADol (ULTRAM) 50 MG tablet Take 1 tablet (50 mg total) by mouth every 12 (twelve) hours as needed for severe pain. 07/14/16   Dorie Rank, MD  UNABLE TO FIND CPAP Use nightly for OSA    [provider]    Family History Family History  Problem Relation Age of Onset  . Hypertension Mother   . Hyperlipidemia Mother   . Heart disease Mother     . Hypertension Maternal Grandmother   . Stroke Maternal Grandmother   . Heart disease Maternal Grandmother   . Hyperlipidemia Maternal Grandmother   . Stroke Maternal Grandfather   . Hypertension Maternal Grandfather   . Heart disease Maternal Grandfather   . Hyperlipidemia Maternal Grandfather   . Alzheimer's disease Maternal Grandfather   . Cancer - Other Paternal Grandmother   . Healthy Brother        x2  . Healthy Sister        x2  . Healthy Son        x2  . Healthy Daughter        x1  . Diabetes Neg Hx   . Heart attack Neg Hx   . Sudden death Neg Hx     Social History Social History  Substance Use Topics  . Smoking status: Never Smoker  . Smokeless tobacco: Never Used  . Alcohol use Yes     Comment: socially -- once every few months     Allergies   Patient has no known allergies.   Review of Systems Review of Systems  Constitutional: Positive for chills and fever.  HENT: Negative for congestion and facial swelling.   Eyes: Negative for discharge and visual disturbance.  Respiratory: Negative for shortness of breath.   Cardiovascular: Negative for chest pain and palpitations.  Gastrointestinal: Negative for abdominal pain, diarrhea and vomiting.  Musculoskeletal: Positive for myalgias. Negative for arthralgias.  Skin: Negative for color change and rash.  Neurological: Negative for tremors, syncope and headaches.  Psychiatric/Behavioral: Negative for confusion and dysphoric mood.     Physical Exam Updated Vital Signs BP (!) 170/110 (BP Location: Right Arm)   Pulse (!) 110   Temp (!) 103.1 F (39.5 C) (Oral)   Resp 18   SpO2 96%   Physical Exam  Constitutional: He is oriented to person, place, and time. He appears well-developed and well-nourished.  HENT:  Head: Normocephalic and atraumatic.  swollen turbinates TMs normal bilaterally.  R frontal sinus ttp.  Mild erythema and posterior nasal drip to oropharynx  Eyes: Pupils are equal, round, and  reactive to light. EOM are normal.  Neck: Normal range of motion. Neck supple. No JVD present.  Cardiovascular: Normal rate and regular rhythm.  Exam reveals no gallop and no friction rub.   No murmur heard. Pulmonary/Chest: No respiratory distress. He has no wheezes.  Abdominal: He exhibits no distension and no mass. There is no tenderness. There is no rebound and no guarding.  Musculoskeletal: Normal range of motion. He exhibits no edema, tenderness or deformity.  Range of motion of the left ankle left knee and internal and external rotation of the hip without significant pain. No noted swelling or erythema.  Neurological: He is alert and oriented to person, place, and  time.  Skin: No rash noted. No pallor.  Psychiatric: He has a normal mood and affect. His behavior is normal.  Nursing note and vitals reviewed.    ED Treatments / Results  Labs (all labs ordered are listed, but only abnormal results are displayed) Labs Reviewed  COMPREHENSIVE METABOLIC PANEL - Abnormal; Notable for the following:       Result Value   Potassium 3.2 (*)    Creatinine, Ser 3.16 (*)    AST 44 (*)    GFR calc non Af Amer 23 (*)    GFR calc Af Amer 27 (*)    All other components within normal limits  CBC WITH DIFFERENTIAL/PLATELET - Abnormal; Notable for the following:    WBC 21.9 (*)    RBC 6.54 (*)    MCV 69.6 (*)    MCH 21.7 (*)    RDW 19.6 (*)    Neutro Abs 19.5 (*)    All other components within normal limits  I-STAT CG4 LACTIC ACID, ED - Abnormal; Notable for the following:    Lactic Acid, Venous 2.83 (*)    All other components within normal limits  URINALYSIS, ROUTINE W REFLEX MICROSCOPIC    EKG  EKG Interpretation None       Radiology Dg Chest 2 View  Result Date: 08/26/2016 CLINICAL DATA:  Fever, takes allopurinol for gout in LEFT foot, worsening pain, fever, headache, body aches, history hypertension, CHF, stage III chronic kidney disease EXAM: CHEST  2 VIEW COMPARISON:   04/25/2015 FINDINGS: Enlargement of cardiac silhouette with pulmonary vascular congestion. Mediastinal contours normal. Mild RIGHT basilar atelectasis. Lungs otherwise clear. No pleural effusion or pneumothorax. Bones unremarkable. IMPRESSION: Mild RIGHT basilar atelectasis. Enlargement of cardiac silhouette with pulmonary vascular congestion. Electronically Signed   By: Lavonia Dana M.D.   On: 08/26/2016 13:19    Procedures Procedures (including critical care time)  Medications Ordered in ED Medications  acetaminophen (TYLENOL) 325 MG tablet (not administered)  acetaminophen (TYLENOL) tablet 650 mg (650 mg Oral Given 08/26/16 1240)     Initial Impression / Assessment and Plan / ED Course  I have reviewed the triage vital signs and the nursing notes.  Pertinent labs & imaging results that were available during my care of the patient were reviewed by me and considered in my medical decision making (see chart for details).     36 yo M With a chief complaints of fever. The started abruptly this morning. He denies any other symptoms. On exam he does have some pretty exquisite tenderness to percussion of the right frontal sinus. Otherwise exam without specific findings. He had initial complaints of some left leg pain though does not appear to be acute. In triage the patient had a septic workup initiated. The patient is well-appearing and nontoxic and I do not feel that I was ordered this. He did have an elevated lactic acid as well as a white blood cell count. Since he has no other symptoms and is well-appearing feel no other diagnostic modalities required at this time. Will treat for sinusitis. PCP follow-up.  3:09 PM:  I have discussed the diagnosis/risks/treatment options with the patient and family and believe the pt to be eligible for discharge home to follow-up with PCP. We also discussed returning to the ED immediately if new or worsening sx occur. We discussed the sx which are most concerning  (e.g., sudden worsening pain, fever, inability to tolerate by mouth) that necessitate immediate return. Medications administered to the patient during  their visit and any new prescriptions provided to the patient are listed below.  Medications given during this visit Medications  acetaminophen (TYLENOL) 325 MG tablet (not administered)  acetaminophen (TYLENOL) tablet 650 mg (650 mg Oral Given 08/26/16 1240)     The patient appears reasonably screen and/or stabilized for discharge and I doubt any other medical condition or other [facility] requiring further screening, evaluation, or treatment in the ED at this time prior to discharge.    Final Clinical Impressions(s) / ED Diagnoses   Final diagnoses:  Acute frontal sinusitis, recurrence not specified    New Prescriptions Discharge Medication List as of 08/26/2016  2:07 PM    START taking these medications   Details  amoxicillin-clavulanate (AUGMENTIN) 875-125 MG tablet Take 1 tablet by mouth 2 (two) times daily., Starting Sat 08/26/2016, Until Sat 09/09/2016, Print         Deno Etienne, DO 08/26/16 1510

## 2016-08-26 NOTE — ED Triage Notes (Signed)
Pt reports he takes Allopurinol for gout in left foot/lower leg.  Pain has worsened, now running fever, headache, body aches.

## 2016-08-26 NOTE — Discharge Instructions (Signed)
Follow up with your PCP.   Take 4 over the counter ibuprofen tablets 3 times a day or 2 over-the-counter naproxen tablets twice a day for pain. Also take tylenol 1000mg (2 extra strength) four times a day.

## 2016-09-30 NOTE — Progress Notes (Signed)
Patient with history of hypertension, hyperlipidemia, CHF, Stage III CKD, OSA and morbid obesity presents to clinic today to discuss symptoms of sleep apnea. Patient endorses wearing CPAP at night as directed. States it is at least 38 years old. Notes increased snoring and apneic episodes despite use of machine. Denies chest pain, palpitations or SOB. Is seeing nutrition and has worked hard to lose a considerable amount of weight since last visit here. Is seeing specialists once yearly now and has been doing well. Notes good home BP measurements.   Patient also wishing to discuss issues with erectile dysfunction over the past several months. Is having issue both with getting and maintaining an erection from time to time. Denies trauma/injury. Denies anatomical change to erect penis. BP well-controlled. Denies dysuria, urinary urgency, frequency. Has taken viagra before with good improvement in erectile function.  Past Medical History:  Diagnosis Date  . CHF (congestive heart failure) ([place])   . Chronic kidney disease, stage 3, mod decreased GFR   . Gout   . 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 Hx of migraine headaches   . Hyperlipidemia   . Hypertension   . 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 Obesity   . OSA (obstructive sleep apnea)     Current Outpatient Prescriptions on File Prior to Visit  Medication Sig Dispense Refill  . allopurinol (ZYLOPRIM) 100 MG tablet Take 1 tablet (100 mg total) by mouth daily. 30 tablet 3  . ARTIFICIAL TEARS 0.1-0.3 % SOLN Place 1 drop into the left eye daily as needed (for dry eyes).     [name] atorvastatin (LIPITOR) 10 MG tablet Take 1 tablet (10 mg total) by mouth daily. 30 tablet 3  . carvedilol (COREG) 25 MG tablet TAKE 1 TABLET BY MOUTH TWICE DAILY WITH A MEAL 180 tablet 1  . EQ FIBER SUPPLEMENT PO Take 1 tablet by mouth daily.     . hydrALAZINE (APRESOLINE) 100 MG tablet TAKE 1 TABLET BY MOUTH THREE TIMES  DAILY 90 tablet 3  . LINZESS 145 MCG CAPS capsule Take 1 capsule by mouth daily as needed.  2  . spironolactone (ALDACTONE) 25 MG tablet Take 1 tablet (25 mg total) by mouth daily. (Patient taking differently: Take 25 mg by mouth 2 (two) times daily. ) 30 tablet 3  . torsemide (DEMADEX) 20 MG tablet Take 1 tablet (20 mg total) by mouth 2 (two) times daily. 30 tablet 0  . UNABLE TO FIND CPAP Use nightly for OSA     No current facility-administered medications on file prior to visit.     No Known Allergies  Family History  Problem Relation Age of Onset  . Hypertension Mother   . Hyperlipidemia Mother   . Heart disease Mother   . Hypertension Maternal Grandmother   . Stroke Maternal Grandmother   . Heart disease Maternal Grandmother   . Hyperlipidemia Maternal Grandmother   . Stroke Maternal Grandfather   . Hypertension Maternal Grandfather   . Heart disease Maternal Grandfather   . Hyperlipidemia Maternal Grandfather   . Alzheimer's disease Maternal Grandfather   . Cancer - Other Paternal Grandmother   . Healthy Brother        x2  . Healthy Sister        x2  . Healthy Son        x2  . Healthy Daughter        x1  . Diabetes Neg Hx   .  Heart attack Neg Hx   . Sudden death Neg Hx     Social History   Social History  . Marital status: Married    Spouse name: N/A  . Number of children: N/A  . Years of education: N/A   Social History Main Topics  . Smoking status: Never Smoker  . Smokeless tobacco: Never Used  . Alcohol use Yes     Comment: socially -- once every few months  . Drug use: No  . Sexual activity: Not Asked   Other Topics Concern  . None   Social History Narrative   Epworth Sleepiness Scale = 10 (as of 12/01/14)   Review of Systems - See HPI.  All other ROS are negative.  BP 128/84   Pulse 86   Temp 98.3 F (36.8 C) (Oral)   Resp 16   Ht 5' 8.5" (1.74 m)   Wt (!) 348 lb (157.9 kg)   SpO2 95%   BMI 52.14 kg/m   Physical Exam    Constitutional: He is oriented to person, place, and time and well-developed, well-nourished, and in no distress.  HENT:  Head: Normocephalic and atraumatic.  Eyes: Conjunctivae are normal.  Neck: Neck supple.  Cardiovascular: Normal rate, regular rhythm, normal heart sounds and intact distal pulses.   Pulmonary/Chest: Effort normal and breath sounds normal. No respiratory distress. He has no wheezes. He has no rales. He exhibits no tenderness.  Neurological: He is alert and oriented to person, place, and time.  Skin: Skin is warm and dry. No rash noted.  Psychiatric: Affect normal.  Vitals reviewed.  Recent Results (from the past 2160 hour(s))  Comprehensive metabolic panel     Status: Abnormal   Collection Time: 08/26/16  1:00 PM  Result Value Ref Range   Sodium 140 135 - 145 mmol/L   Potassium 3.2 (L) 3.5 - 5.1 mmol/L   Chloride 101 101 - 111 mmol/L   CO2 27 22 - 32 mmol/L   Glucose, Bld 97 65 - 99 mg/dL   BUN 18 6 - 20 mg/dL   Creatinine, Ser 1.93 (H) 0.61 - 1.24 mg/dL   Calcium 9.0 8.9 - 96.6 mg/dL   Total Protein 8.1 6.5 - 8.1 g/dL   Albumin 3.8 3.5 - 5.0 g/dL   AST 44 (H) 15 - 41 U/L   ALT 36 17 - 63 U/L   Alkaline Phosphatase 119 38 - 126 U/L   Total Bilirubin 1.2 0.3 - 1.2 mg/dL   GFR calc non Af Amer 23 (L) >60 mL/min   GFR calc Af Amer 27 (L) >60 mL/min    Comment: (NOTE) The eGFR has been calculated using the CKD EPI equation. This calculation has not been validated in all clinical situations. eGFR's persistently <60 mL/min signify possible Chronic Kidney Disease.    Anion gap 12 5 - 15  CBC with Differential     Status: Abnormal   Collection Time: 08/26/16  1:00 PM  Result Value Ref Range   WBC 21.9 (H) 4.0 - 10.5 K/uL   RBC 6.54 (H) 4.22 - 5.81 MIL/uL   Hemoglobin 14.2 13.0 - 17.0 g/dL   HCT 57.8 14.0 - 25.9 %   MCV 69.6 (L) 78.0 - 100.0 fL   MCH 21.7 (L) 26.0 - 34.0 pg   MCHC 31.2 30.0 - 36.0 g/dL   RDW 27.0 (H) 17.8 - 43.3 %   Platelets 295 150 -  400 K/uL   Neutrophils Relative % 89 %   Lymphocytes  Relative 7 %   Monocytes Relative 4 %   Eosinophils Relative 0 %   Basophils Relative 0 %   Neutro Abs 19.5 (H) 1.7 - 7.7 K/uL   Lymphs Abs 1.5 0.7 - 4.0 K/uL   Monocytes Absolute 0.9 0.1 - 1.0 K/uL   Eosinophils Absolute 0.0 0.0 - 0.7 K/uL   Basophils Absolute 0.0 0.0 - 0.1 K/uL   RBC Morphology ELLIPTOCYTES   I-Stat CG4 Lactic Acid, ED     Status: Abnormal   Collection Time: 08/26/16  1:27 PM  Result Value Ref Range   Lactic Acid, Venous 2.83 (HH) 0.5 - 1.9 mmol/L   Comment NOTIFIED PHYSICIAN    Assessment/Plan: OSA (obstructive sleep apnea) Will set patient up with sleep medicine again for repeat sleep study and CPAP adjustments.   Erectile dysfunction Will start trial of Viagra 25 mg as directed. Follow-up discussed.     Leeanne Rio, PA-C

## 2016-10-02 ENCOUNTER — Encounter: Payer: Self-pay | Admitting: Physician Assistant

## 2016-10-02 ENCOUNTER — Ambulatory Visit (INDEPENDENT_AMBULATORY_CARE_PROVIDER_SITE_OTHER): Payer: Managed Care, Other (non HMO) | Admitting: Physician Assistant

## 2016-10-02 VITALS — BP 128/84 | HR 86 | Temp 98.3°F | Resp 16 | Ht 68.5 in | Wt 348.0 lb

## 2016-10-02 DIAGNOSIS — G4733 Obstructive sleep apnea (adult) (pediatric): Secondary | ICD-10-CM

## 2016-10-02 DIAGNOSIS — G43909 Migraine, unspecified, not intractable, without status migrainosus: Secondary | ICD-10-CM | POA: Insufficient documentation

## 2016-10-02 DIAGNOSIS — N529 Male erectile dysfunction, unspecified: Secondary | ICD-10-CM | POA: Diagnosis not present

## 2016-10-02 HISTORY — DX: Male erectile dysfunction, unspecified: N52.9

## 2016-10-02 MED ORDER — SILDENAFIL CITRATE 25 MG PO TABS: 25.0000 mg | ORAL_TABLET | Freq: Every day | ORAL | 0 refills | Status: DC | PRN

## 2016-10-02 NOTE — Patient Instructions (Signed)
I am setting you up for a repeat sleep study. I am also going to see if we can get you a new mask for current machine/settings in the mean time.   Please take the generic Viagra as directed to help with ED. Hydrate well before taking.

## 2016-10-02 NOTE — Progress Notes (Signed)
Pre visit review using our clinic review tool, if applicable. No additional management support is needed unless otherwise documented below in the visit note. 

## 2016-10-02 NOTE — Assessment & Plan Note (Signed)
Will set patient up with sleep medicine again for repeat sleep study and CPAP adjustments.

## 2016-10-02 NOTE — Assessment & Plan Note (Signed)
Will start trial of Viagra 25 mg as directed. Follow-up discussed.

## 2016-10-03 ENCOUNTER — Telehealth: Payer: Self-pay | Admitting: Physician Assistant

## 2016-10-03 DIAGNOSIS — I10 Essential (primary) hypertension: Secondary | ICD-10-CM

## 2016-10-03 DIAGNOSIS — E785 Hyperlipidemia, unspecified: Secondary | ICD-10-CM

## 2016-10-03 MED ORDER — ATORVASTATIN CALCIUM 10 MG PO TABS: 10.0000 mg | ORAL_TABLET | Freq: Every day | ORAL | 3 refills | Status: DC

## 2016-10-03 MED ORDER — ALLOPURINOL 100 MG PO TABS: 100.0000 mg | ORAL_TABLET | Freq: Every day | ORAL | 3 refills | Status: DC

## 2016-10-03 MED ORDER — FLUTICASONE PROPIONATE 50 MCG/ACT NA SUSP: 2.0000 | Freq: Every day | NASAL | 6 refills | Status: DC

## 2016-10-03 MED ORDER — SPIRONOLACTONE 25 MG PO TABS: 25.0000 mg | ORAL_TABLET | Freq: Every day | ORAL | 3 refills | Status: DC

## 2016-10-03 NOTE — Telephone Encounter (Signed)
Medications have been sent

## 2016-10-03 NOTE — Telephone Encounter (Signed)
Pt has been called

## 2016-10-03 NOTE — Telephone Encounter (Signed)
Pt states that he needs all Rx called into pharmacy, that only one was called in and he really needs the nasal spray for snoring. Pt would like a call back when all Rx have been called in. [facility]

## 2016-10-05 ENCOUNTER — Telehealth: Payer: Self-pay | Admitting: Physician Assistant

## 2016-10-05 DIAGNOSIS — I10 Essential (primary) hypertension: Secondary | ICD-10-CM

## 2016-10-05 MED ORDER — HYDRALAZINE HCL 100 MG PO TABS: 100.0000 mg | ORAL_TABLET | Freq: Three times a day (TID) | ORAL | 3 refills | Status: DC

## 2016-10-05 MED ORDER — CARVEDILOL 25 MG PO TABS: ORAL_TABLET | ORAL | 3 refills | Status: DC

## 2016-10-05 MED ORDER — TORSEMIDE 20 MG PO TABS
20.0000 mg | ORAL_TABLET | Freq: Two times a day (BID) | ORAL | 3 refills | Status: DC
Start: 2016-10-05 — End: 2017-04-01

## 2016-10-05 NOTE — Telephone Encounter (Signed)
Please advise about refilling of the other medications patient requested? If these medications will be refilled by you or Cardiology.

## 2016-10-05 NOTE — Telephone Encounter (Signed)
Pt states that he is still missing refill request on carvedilol, hydralazine, and torsemide, pt states that he is about to run out of some of these, [facility] on [address]

## 2016-10-05 NOTE — Telephone Encounter (Signed)
Patient advised that rx were refilled to the pharmacy

## 2016-10-05 NOTE — Telephone Encounter (Signed)
Ok to refill Torsemide. If you look at his med list, the other two medication should have additional refills on them. Has he contacted his pharmacy to request his additional refills on current Rx?

## 2016-10-18 ENCOUNTER — Telehealth: Payer: Self-pay | Admitting: Physician Assistant

## 2016-10-18 NOTE — Telephone Encounter (Signed)
Patient is scheduled to see Dr. Elsworth Soho on 10/30 which he is already aware of.

## 2016-10-18 NOTE — Telephone Encounter (Signed)
Patient was referred to Pulmonology for repeat sleep study and to handle CPAP/OSA needs. Please check on status of appointment for patient.

## 2016-10-18 NOTE — Telephone Encounter (Signed)
Patient states pcp was supposed to order mask for cpap machine.  He is requesting callback with status of this.

## 2016-11-14 ENCOUNTER — Encounter: Payer: Self-pay | Admitting: Pulmonary Disease

## 2016-11-14 ENCOUNTER — Ambulatory Visit (INDEPENDENT_AMBULATORY_CARE_PROVIDER_SITE_OTHER): Payer: Managed Care, Other (non HMO) | Admitting: Pulmonary Disease

## 2016-11-14 VITALS — BP 134/82 | HR 85 | Ht 68.5 in | Wt 347.0 lb

## 2016-11-14 DIAGNOSIS — G4733 Obstructive sleep apnea (adult) (pediatric): Secondary | ICD-10-CM | POA: Diagnosis not present

## 2016-11-14 NOTE — Assessment & Plan Note (Signed)
Given excessive daytime somnolence, narrow pharyngeal exam, witnessed apneas & loud snoring, obstructive sleep apnea is very likely & an overnight polysomnogram will be scheduled as a home study. The pathophysiology of obstructive sleep apnea , it's cardiovascular consequences & modes of treatment including CPAP were discused with the patient in detail & they evidenced understanding.  Pre test prob is high, likely will need a CPAP titration study

## 2016-11-14 NOTE — Patient Instructions (Signed)
Home sleep study Base don this, you may need  A CPAP titration study

## 2016-11-14 NOTE — Progress Notes (Signed)
Subjective:    Patient ID: Frank Coffey, male    DOB: [birth year], 36 y.o.   MRN: [id number]  HPI  Chief Complaint  Patient presents with  . Sleep Consult    For possible OSA. Had a SS about 10 years ago. Is not using a CPAP. States that he snores a lot during the night.     36 year old morbidly obese man presents for evaluation of sleep disordered breathing. 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  He was diagnosed with OSA about 12 years ago, sleep study not available to me at the current time, started on CPAP which he did not use after a few weeks for no clear reason. He desires reevaluation now.  Loud snoring has been noted by his wife was also witnessed apneas.  He reports chronic sinus congestion which has not been relieved by a nasal spray. 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 Epworth sleepiness score is 5 but I wonder if he is under reporting. Bedtime is between 10:11 PM, sleep latency is minimal, he sleeps in no preferred position with one pillow, denies nocturnal awakenings for nocturia and is out of bed at 8 AM feeling rested without dryness of mouth or headaches. He has been consciously trying to lose weight has embarked on a diet and exercise program and has lost 50 pounds over the last 1 year. There is no history suggestive of cataplexy, sleep paralysis or parasomnias He has difficult to control hypertension requiring 3 medications    Past Medical History:  Diagnosis Date  . CHF (congestive heart failure) ([place])   . Chronic kidney disease, stage 3, mod decreased GFR ([facility])   . Gout   . 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 Hx of migraine headaches   . Hyperlipidemia   . Hypertension   . Hypertensive heart disease with congestive heart failure and stage 3 kidney disease ([place])   . Obesity   . OSA (obstructive sleep apnea)    Past Surgical History:  Procedure  Laterality Date  . CHOLECYSTECTOMY    . RETINAL DETACHMENT REPAIR W/ SCLERAL BUCKLE LE     Left    No Known Allergies  Social History   Social History  . Marital status: Divorced    Spouse name: N/A  . Number of children: N/A  . Years of education: N/A   Occupational History  . Not on file.   Social History Main Topics  . Smoking status: Never Smoker  . Smokeless tobacco: Never Used  . Alcohol use Yes     Comment: socially -- once every few months  . Drug use: No  . Sexual activity: Not on file   Other Topics Concern  . Not on file   Social History Narrative   Epworth Sleepiness Scale = 10 (as of 12/01/14)      Family History  Problem Relation Age of Onset  . Hypertension Mother   . Hyperlipidemia Mother   . Heart disease Mother   . Hypertension Maternal Grandmother   . Stroke Maternal Grandmother   . Heart disease Maternal Grandmother   . Hyperlipidemia Maternal Grandmother   . Stroke Maternal Grandfather   . Hypertension Maternal Grandfather   . Heart disease Maternal Grandfather   . Hyperlipidemia Maternal Grandfather   . Alzheimer's disease Maternal Grandfather   . Cancer - Other Paternal Grandmother   . Healthy Brother  x2  . Healthy Sister        x2  . Healthy Son        x2  . Healthy Daughter        x1  . Diabetes Neg Hx   . Heart attack Neg Hx   . Sudden death Neg Hx      Review of Systems Constitutional: negative for anorexia, fevers and sweats  Eyes: negative for irritation, redness and visual disturbance  Ears, nose, mouth, throat, and face: negative for earaches, epistaxis, nasal congestion and sore throat  Respiratory: negative for cough, dyspnea on exertion, sputum and wheezing  Cardiovascular: negative for chest pain, dyspnea, lower extremity edema, orthopnea, palpitations and syncope  Gastrointestinal: negative for abdominal pain, constipation, diarrhea, melena, nausea and vomiting  Genitourinary:negative for dysuria,  frequency and hematuria  Hematologic/lymphatic: negative for bleeding, easy bruising and lymphadenopathy  Musculoskeletal:negative for arthralgias, muscle weakness and stiff joints  Neurological: negative for coordination problems, gait problems, headaches and weakness  Endocrine: negative for diabetic symptoms including polydipsia, polyuria and weight loss     Objective:   Physical Exam  Gen. Pleasant, obese, in no distress, normal affect ENT -  no post nasal drip, class 2-3 airway Neck: No JVD, no thyromegaly, no carotid bruits Lungs: no use of accessory muscles, no dullness to percussion, decreased without rales or rhonchi  Cardiovascular: Rhythm regular, heart sounds  normal, no murmurs or gallops, no peripheral edema Abdomen: soft and non-tender, no hepatosplenomegaly, BS normal. Musculoskeletal: No deformities, no cyanosis or clubbing Neuro:  alert, non focal, no tremors       Assessment & Plan:

## 2016-12-12 DIAGNOSIS — G4733 Obstructive sleep apnea (adult) (pediatric): Secondary | ICD-10-CM | POA: Diagnosis not present

## 2016-12-13 DIAGNOSIS — G4733 Obstructive sleep apnea (adult) (pediatric): Secondary | ICD-10-CM | POA: Diagnosis not present

## 2016-12-14 ENCOUNTER — Telehealth: Payer: Self-pay | Admitting: Pulmonary Disease

## 2016-12-14 NOTE — Telephone Encounter (Signed)
Per RA, HST showed severe OSA with 64 events per hour. Recommends a CPAP titration study as the next step.

## 2016-12-15 ENCOUNTER — Other Ambulatory Visit: Payer: Self-pay | Admitting: *Deleted

## 2016-12-15 DIAGNOSIS — G4733 Obstructive sleep apnea (adult) (pediatric): Secondary | ICD-10-CM

## 2017-01-02 ENCOUNTER — Emergency Department (HOSPITAL_BASED_OUTPATIENT_CLINIC_OR_DEPARTMENT_OTHER)
Admission: EM | Admit: 2017-01-02 | Discharge: 2017-01-02 | Disposition: A | Discharge status: Home / Self Care | Payer: Managed Care, Other (non HMO) | Attending: Emergency Medicine | Admitting: Emergency Medicine

## 2017-01-02 ENCOUNTER — Encounter (HOSPITAL_BASED_OUTPATIENT_CLINIC_OR_DEPARTMENT_OTHER): Payer: Self-pay | Admitting: *Deleted

## 2017-01-02 ENCOUNTER — Other Ambulatory Visit: Payer: Self-pay

## 2017-01-02 ENCOUNTER — Emergency Department (HOSPITAL_BASED_OUTPATIENT_CLINIC_OR_DEPARTMENT_OTHER): Payer: Managed Care, Other (non HMO)

## 2017-01-02 DIAGNOSIS — I509 Heart failure, unspecified: Secondary | ICD-10-CM | POA: Diagnosis not present

## 2017-01-02 DIAGNOSIS — Z79899 Other long term (current) drug therapy: Secondary | ICD-10-CM | POA: Diagnosis not present

## 2017-01-02 DIAGNOSIS — I130 Hypertensive heart and chronic kidney disease with heart failure and stage 1 through stage 4 chronic kidney disease, or unspecified chronic kidney disease: Secondary | ICD-10-CM | POA: Diagnosis not present

## 2017-01-02 DIAGNOSIS — N184 Chronic kidney disease, stage 4 (severe): Secondary | ICD-10-CM | POA: Diagnosis not present

## 2017-01-02 DIAGNOSIS — R05 Cough: Secondary | ICD-10-CM | POA: Diagnosis not present

## 2017-01-02 DIAGNOSIS — R0981 Nasal congestion: Secondary | ICD-10-CM | POA: Insufficient documentation

## 2017-01-02 DIAGNOSIS — R059 Cough, unspecified: Secondary | ICD-10-CM

## 2017-01-02 DIAGNOSIS — R2243 Localized swelling, mass and lump, lower limb, bilateral: Secondary | ICD-10-CM | POA: Insufficient documentation

## 2017-01-02 MED ORDER — AZITHROMYCIN 250 MG PO TABS: 250.0000 mg | ORAL_TABLET | Freq: Every day | ORAL | 0 refills | Status: DC

## 2017-01-02 MED ORDER — PREDNISONE 50 MG PO TABS
60.0000 mg | ORAL_TABLET | Freq: Once | ORAL | Status: AC
  Administered 2017-01-02: 60 mg via ORAL
  Filled 2017-01-02: qty 1

## 2017-01-02 MED ORDER — PROMETHAZINE-DM 6.25-15 MG/5ML PO SYRP: 5.0000 mL | ORAL_SOLUTION | Freq: Four times a day (QID) | ORAL | 0 refills | Status: DC | PRN

## 2017-01-02 MED ORDER — PREDNISONE 20 MG PO TABS: 40.0000 mg | ORAL_TABLET | Freq: Every day | ORAL | 0 refills | Status: AC

## 2017-01-02 NOTE — ED Triage Notes (Signed)
Cough for 2 weeks. Hx pneumonia a year ago. He thinks he has it again.

## 2017-01-02 NOTE — Discharge Instructions (Signed)
Take prednisone as prescribed. Take antibiotics.  Take the entire course, even if your symptoms have improved. Use cough syrup as needed. Make sure you stay well-hydrated with water. If you are still feeling congested, you may take another dose of your Lasix tomorrow.  However, you should follow-up with your primary care doctor for further evaluation of your symptoms. Return to the emergency room if you develop difficulty breathing, persistent chest pain, or any new or concerning symptoms.

## 2017-01-03 NOTE — ED Provider Notes (Signed)
[facility] EMERGENCY DEPARTMENT Provider Note   CSN: [id number] Arrival date & time: 01/02/17  1928     History   Chief Complaint Chief Complaint  Patient presents with  . Cough    HPI Frank Coffey is a 36 y.o. male presenting with cough.  Pt states he has had cough x3 wks. It began with URI sxs including sore throat, nasal congestion and fevers. All sxs resolved except cough, cough is nonproductive.  It is not worse during the night. He reports increased feeling of pulmonary congestion today, which improved with an extra dose of his lasix. He denies abd or extremities swelling. He denies current sick contacts. He does not want evaluation for his CHF today, only tx of his cough. He denies increased SOB or CP.   HPI  Past Medical History:  Diagnosis Date  . CHF (congestive heart failure) ([place])   . Chronic kidney disease, stage 3, mod decreased GFR (H[facility])   . Gout   . 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 Hx of migraine headaches   . Hyperlipidemia   . Hypertension   . Hypertensive heart disease with congestive heart failure and stage 3 kidney disease ([place])   . Obesity   . OSA (obstructive sleep apnea)     Patient Active Problem List   Diagnosis Date Noted  . Migraine 10/02/2016  . Erectile dysfunction 10/02/2016  . Annual physical exam 02/22/2016  . Onychomycosis 02/22/2016  . Pre-operative cardiovascular examination 07/19/2015  . NICM (nonischemic cardiomyopathy) ([place]) 07/19/2015  . Morbid obesity due to excess calories ([place]) 07/19/2015  . 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 CHF (congestive heart failure) ([place]) 12/19/2012  . Benign hypertension with chronic kidney disease, stage IV ([place]) 12/19/2012  . Gout 12/19/2012  . CKD (chronic kidney disease) stage 4, GFR 15-29 ml/min ([facility]) 12/19/2012  . OSA (obstructive sleep apnea) 12/19/2012    Past Surgical History:  Procedure Laterality Date  . CHOLECYSTECTOMY    . RETINAL DETACHMENT REPAIR W/ SCLERAL  BUCKLE LE     Left       Home Medications    Prior to Admission medications   Medication Sig Start Date End Date Taking? Authorizing Provider  allopurinol (ZYLOPRIM) 100 MG tablet Take 1 tablet (100 mg total) by mouth daily. 10/03/16   Brunetta Jeans, PA-C  ARTIFICIAL TEARS 0.1-0.3 % SOLN Place 1 drop into the left eye daily as needed (for dry eyes).     [provider]  atorvastatin (LIPITOR) 10 MG tablet Take 1 tablet (10 mg total) by mouth daily. 10/03/16   Brunetta Jeans, PA-C  azithromycin (ZITHROMAX) 250 MG tablet Take 1 tablet (250 mg total) by mouth daily. Take first 2 tablets together, then 1 every day until finished. 01/02/17   Catlyn Shipton, PA-C  carvedilol (COREG) 25 MG tablet TAKE 1 TABLET BY MOUTH TWICE DAILY WITH A MEAL 10/05/16   Brunetta Jeans, PA-C  EQ FIBER SUPPLEMENT PO Take 1 tablet by mouth daily.     [provider]  fluticasone (FLONASE) 50 MCG/ACT nasal spray Place 2 sprays into both nostrils daily. 10/03/16   Brunetta Jeans, PA-C  hydrALAZINE (APRESOLINE) 100 MG tablet Take 1 tablet (100 mg total) by mouth 3 (three) times daily. 10/05/16   Brunetta Jeans, PA-C  LINZESS 145 MCG CAPS capsule Take 1 capsule by mouth daily as needed. 05/03/15   [provider]  predniSONE (DELTASONE) 20 MG tablet Take 2 tablets (40 mg total) by mouth  daily for 4 days. 01/02/17 01/06/17  Candice Lunney, PA-C  promethazine-dextromethorphan (PROMETHAZINE-DM) 6.25-15 MG/5ML syrup Take 5 mLs by mouth 4 (four) times daily as needed for cough. 01/02/17   Delcie Ruppert, PA-C  sildenafil (VIAGRA) 25 MG tablet Take 1 tablet (25 mg total) by mouth daily as needed for erectile dysfunction. 10/02/16   Brunetta Jeans, PA-C  spironolactone (ALDACTONE) 25 MG tablet Take 1 tablet (25 mg total) by mouth daily. 10/03/16   Brunetta Jeans, PA-C  torsemide (DEMADEX) 20 MG tablet Take 1 tablet (20 mg total) by mouth 2 (two) times daily. 10/05/16   Brunetta Jeans, PA-C  UNABLE TO FIND CPAP Use nightly for OSA    [provider]    Family History Family History  Problem Relation Age of Onset  . Hypertension Mother   . Hyperlipidemia Mother   . Heart disease Mother   . Hypertension Maternal Grandmother   . Stroke Maternal Grandmother   . Heart disease Maternal Grandmother   . Hyperlipidemia Maternal Grandmother   . Stroke Maternal Grandfather   . Hypertension Maternal Grandfather   . Heart disease Maternal Grandfather   . Hyperlipidemia Maternal Grandfather   . Alzheimer's disease Maternal Grandfather   . Cancer - Other Paternal Grandmother   . Healthy Brother        x2  . Healthy Sister        x2  . Healthy Son        x2  . Healthy Daughter        x1  . Diabetes Neg Hx   . Heart attack Neg Hx   . Sudden death Neg Hx     Social History Social History   Tobacco Use  . Smoking status: Never Smoker  . Smokeless tobacco: Never Used  Substance Use Topics  . Alcohol use: Yes    Comment: socially -- once every few months  . Drug use: No     Allergies   Patient has no known allergies.   Review of Systems Review of Systems  Constitutional: Negative for chills and fever.  HENT: Negative for congestion and sore throat.   Respiratory: Positive for cough. Negative for chest tightness and shortness of breath.   Cardiovascular: Negative for chest pain and leg swelling.     Physical Exam Updated Vital Signs BP (!) 151/98 (BP Location: Right Arm)   Pulse 72   Temp 98.2 F (36.8 C) (Oral)   Resp 18   Ht 5\' 8"  (1.727 m)   Wt (!) 149.7 kg (330 lb)   SpO2 98%   BMI 50.18 kg/m   Physical Exam  Constitutional: He is oriented to person, place, and time. He appears well-developed and well-nourished. No distress.  HENT:  Head: Normocephalic and atraumatic.  Right Ear: Tympanic membrane, external ear and ear canal normal.  Left Ear: Tympanic membrane, external ear and ear canal normal.  Nose: Nose normal.  Right sinus exhibits no maxillary sinus tenderness and no frontal sinus tenderness. Left sinus exhibits no maxillary sinus tenderness and no frontal sinus tenderness.  Mouth/Throat: Uvula is midline, oropharynx is clear and moist and mucous membranes are normal.  OP clear without tonsillar swelling or exudate.  Eyes: Conjunctivae and EOM are normal. Pupils are equal, round, and reactive to light.  Neck: Normal range of motion.  Cardiovascular: Normal rate, regular rhythm and intact distal pulses.  Pulmonary/Chest: Effort normal. No respiratory distress. He has no wheezes. He has no rales.  Abdominal: Soft.  He exhibits no distension. There is no tenderness. There is no guarding.  Musculoskeletal: Normal range of motion. He exhibits edema. He exhibits no tenderness.  Mild nonpitting leg edema bilaterally (baseline per pt).  No tenderness palpation of the legs.  Lymphadenopathy:    He has no cervical adenopathy.  Neurological: He is alert and oriented to person, place, and time.  Skin: Skin is warm. No rash noted.  Psychiatric: He has a normal mood and affect.  Nursing note and vitals reviewed.    ED Treatments / Results  Labs (all labs ordered are listed, but only abnormal results are displayed) Labs Reviewed - No data to display  EKG  EKG Interpretation None       Radiology Dg Chest 2 View  Result Date: 01/02/2017 CLINICAL DATA:  Central chest pain and short of breath EXAM: CHEST  2 VIEW COMPARISON:  08/26/2016 FINDINGS: Mild cardiomegaly. Low lung volumes. No focal consolidation or effusion. No pneumothorax. IMPRESSION: Mild cardiomegaly. Negative for pulmonary edema or focal pulmonary infiltrate. Electronically Signed   By: Donavan Foil M.D.   On: 01/02/2017 20:25    Procedures Procedures (including critical care time)  Medications Ordered in ED Medications  predniSONE (DELTASONE) tablet 60 mg (60 mg Oral Given 01/02/17 2300)     Initial Impression / Assessment and  Plan / ED Course  I have reviewed the triage vital signs and the nursing notes.  Pertinent labs & imaging results that were available during my care of the patient were reviewed by me and considered in my medical decision making (see chart for details).     Patient presenting for evaluation and management of his cough.  He reports some CHF symptoms including feeling of pulmonary congestion improved with Lasix.  He states he only wants evaluation for his cough today.  Chest x-ray negative.  Physical exam shows he is afebrile and not tachycardic.  Pulmonary exam reassuring without rales or rhonchi.  Likely viral etiology of cough, however considering his comorbidities, will treat with Z-Pak.  Prednisone and cough syrup given.  Patient to follow-up with primary care.  At this time, patient appears safe for discharge.  Return precautions given.  Patient states he understands and agrees to plan.   Final Clinical Impressions(s) / ED Diagnoses   Final diagnoses:  Cough    ED Discharge Orders        Ordered    predniSONE (DELTASONE) 20 MG tablet  Daily     01/02/17 2251    promethazine-dextromethorphan (PROMETHAZINE-DM) 6.25-15 MG/5ML syrup  4 times daily PRN     01/02/17 2251    azithromycin (ZITHROMAX) 250 MG tablet  Daily     01/02/17 2251       Franchot Heidelberg, PA-C 01/03/17 0207    Deno Etienne, DO 01/04/17 1545

## 2017-02-03 ENCOUNTER — Emergency Department (HOSPITAL_COMMUNITY): Payer: Managed Care, Other (non HMO)

## 2017-02-03 ENCOUNTER — Emergency Department (HOSPITAL_COMMUNITY)
Admission: EM | Admit: 2017-02-03 | Discharge: 2017-02-03 | Disposition: A | Discharge status: Home / Self Care | Payer: Managed Care, Other (non HMO) | Attending: Emergency Medicine | Admitting: Emergency Medicine

## 2017-02-03 ENCOUNTER — Other Ambulatory Visit: Payer: Self-pay

## 2017-02-03 ENCOUNTER — Encounter (HOSPITAL_COMMUNITY): Payer: Self-pay

## 2017-02-03 DIAGNOSIS — I10 Essential (primary) hypertension: Secondary | ICD-10-CM

## 2017-02-03 DIAGNOSIS — R51 Headache: Secondary | ICD-10-CM | POA: Diagnosis present

## 2017-02-03 DIAGNOSIS — R519 Headache, unspecified: Secondary | ICD-10-CM

## 2017-02-03 DIAGNOSIS — I509 Heart failure, unspecified: Secondary | ICD-10-CM | POA: Insufficient documentation

## 2017-02-03 DIAGNOSIS — N183 Chronic kidney disease, stage 3 (moderate): Secondary | ICD-10-CM | POA: Insufficient documentation

## 2017-02-03 DIAGNOSIS — Z79899 Other long term (current) drug therapy: Secondary | ICD-10-CM | POA: Insufficient documentation

## 2017-02-03 DIAGNOSIS — N189 Chronic kidney disease, unspecified: Secondary | ICD-10-CM

## 2017-02-03 DIAGNOSIS — R55 Syncope and collapse: Secondary | ICD-10-CM | POA: Insufficient documentation

## 2017-02-03 DIAGNOSIS — I130 Hypertensive heart and chronic kidney disease with heart failure and stage 1 through stage 4 chronic kidney disease, or unspecified chronic kidney disease: Secondary | ICD-10-CM | POA: Insufficient documentation

## 2017-02-03 LAB — CBC
HEMATOCRIT: 41.7 % (ref 39.0–52.0)
Hemoglobin: 13 g/dL (ref 13.0–17.0)
MCH: 22.9 pg — AB (ref 26.0–34.0)
MCHC: 31.2 g/dL (ref 30.0–36.0)
MCV: 73.4 fL — AB (ref 78.0–100.0)
Platelets: 283 10*3/uL (ref 150–400)
RBC: 5.68 MIL/uL (ref 4.22–5.81)
RDW: 17.3 % — ABNORMAL HIGH (ref 11.5–15.5)
WBC: 9.6 10*3/uL (ref 4.0–10.5)

## 2017-02-03 LAB — URINALYSIS, ROUTINE W REFLEX MICROSCOPIC
Bilirubin Urine: NEGATIVE
Glucose, UA: NEGATIVE mg/dL
HGB URINE DIPSTICK: NEGATIVE
Ketones, ur: NEGATIVE mg/dL
LEUKOCYTES UA: NEGATIVE
Nitrite: NEGATIVE
PROTEIN: 100 mg/dL — AB
SQUAMOUS EPITHELIAL / LPF: NONE SEEN
Specific Gravity, Urine: 1.013 (ref 1.005–1.030)
pH: 6 (ref 5.0–8.0)

## 2017-02-03 LAB — BASIC METABOLIC PANEL
Anion gap: 7 (ref 5–15)
BUN: 22 mg/dL — ABNORMAL HIGH (ref 6–20)
CHLORIDE: 104 mmol/L (ref 101–111)
CO2: 27 mmol/L (ref 22–32)
Calcium: 8.7 mg/dL — ABNORMAL LOW (ref 8.9–10.3)
Creatinine, Ser: 3.32 mg/dL — ABNORMAL HIGH (ref 0.61–1.24)
GFR calc Af Amer: 25 mL/min — ABNORMAL LOW (ref >60)
GFR calc non Af Amer: 22 mL/min — ABNORMAL LOW (ref >60)
GLUCOSE: 102 mg/dL — AB (ref 65–99)
POTASSIUM: 3.3 mmol/L — AB (ref 3.5–5.1)
Sodium: 138 mmol/L (ref 135–145)

## 2017-02-03 LAB — BRAIN NATRIURETIC PEPTIDE: B NATRIURETIC PEPTIDE 5: 271.5 pg/mL — AB (ref 0.0–100.0)

## 2017-02-03 LAB — I-STAT TROPONIN, ED
Troponin i, poc: 0.06 ng/mL (ref 0.00–0.08)
Troponin i, poc: 0.06 ng/mL (ref 0.00–0.08)

## 2017-02-03 LAB — CBG MONITORING, ED: GLUCOSE-CAPILLARY: 98 mg/dL (ref 65–99)

## 2017-02-03 MED ORDER — METOCLOPRAMIDE HCL 5 MG/ML IJ SOLN
10.0000 mg | Freq: Once | INTRAMUSCULAR | Status: AC
  Administered 2017-02-03: 10 mg via INTRAVENOUS
  Filled 2017-02-03: qty 2

## 2017-02-03 MED ORDER — HYDRALAZINE HCL 20 MG/ML IJ SOLN
10.0000 mg | INTRAMUSCULAR | Status: AC
  Administered 2017-02-03: 10 mg via INTRAVENOUS
  Filled 2017-02-03: qty 1

## 2017-02-03 MED ORDER — POTASSIUM CHLORIDE CRYS ER 20 MEQ PO TBCR
40.0000 meq | EXTENDED_RELEASE_TABLET | Freq: Once | ORAL | Status: AC
  Administered 2017-02-03: 40 meq via ORAL
  Filled 2017-02-03: qty 2

## 2017-02-03 MED ORDER — KETOROLAC TROMETHAMINE 30 MG/ML IJ SOLN
30.0000 mg | Freq: Once | INTRAMUSCULAR | Status: AC
  Administered 2017-02-03: 30 mg via INTRAVENOUS
  Filled 2017-02-03: qty 1

## 2017-02-03 NOTE — Discharge Instructions (Signed)
1. Medications: usual home medications including BP medications 2. Treatment: rest, drink plenty of fluids, 3. Follow Up: Please followup with your primary doctor in 2 days for discussion of your diagnoses and further evaluation after today's visit; if you do not have a primary care doctor use the resource guide provided to find one; Please return to the ER for return of syncope, sudden onset headache, chest pain, shortness of breath or other concerns

## 2017-02-03 NOTE — ED Triage Notes (Signed)
Patient arrives by EMS with complaints of syncopal episode/headache/hands tingling and hypertension. Patient got up to use the bathroom and had a syncopal episode and patient found in closet in fetal position. First responder states patient groggy. Patient is now alert and oriented. Patient states he got up to the bathroom and his hands were tingling and states he felt groggy and then he states the next thing he knew was he was on the floor in the closet with everyone around him.

## 2017-02-03 NOTE — ED Provider Notes (Signed)
[facility] DEPT Provider Note   CSN: [id number] Arrival date & time: 02/03/17  0206     History   Chief Complaint Chief Complaint  Patient presents with  . Syncopal episode  . Hypertension  . Headache    HPI Frank Coffey is a 37 y.o. male with a hx of CHF, CKD, migraine headaches, gout, HTN, obesity, sleep apnea presents to the Emergency Department complaining of gradual, persistent, progressively worsening headache onset this morning.  Pt reports approx 88min PTA, he arose from bed became lightheaded and experienced a full syncopal episode.  Pt reports his hands felt "tingly" just before he passed out.  Pt denies loss of bowel or bladder control.  No previous hx syncope.  Pt denies COP, SOB.  Pt reports he has been very stressed as of late and has been having daily headaches.  He denies alleviating factors at this time.  Pt denies fever, chills, neck pain, chest pain, shortness of breath, abdominal pain, nausea, vomiting, weakness, dizziness, dysuria, hematuria.     The history is provided by the patient and medical records. No language interpreter was used.    Past Medical History:  Diagnosis Date  . CHF (congestive heart failure) ([place])   . Chronic kidney disease, stage 3, mod decreased GFR (HCC)   . Gout   . 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 Hx of migraine headaches   . Hyperlipidemia   . Hypertension   . 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 Obesity   . OSA (obstructive sleep apnea)     Patient Active Problem List   Diagnosis Date Noted  . Migraine 10/02/2016  . Erectile dysfunction 10/02/2016  . Annual physical exam 02/22/2016  . Onychomycosis 02/22/2016  . Pre-operative cardiovascular examination 07/19/2015  . NICM (nonischemic cardiomyopathy) ([place]) 07/19/2015  . Morbid obesity due to excess calories ([place]) 07/19/2015  . 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 CHF (congestive  heart failure) ([place]) 12/19/2012  . Benign hypertension with chronic kidney disease, stage IV ([place]) 12/19/2012  . Gout 12/19/2012  . CKD (chronic kidney disease) stage 4, GFR 15-29 ml/min (HCC) 12/19/2012  . OSA (obstructive sleep apnea) 12/19/2012    Past Surgical History:  Procedure Laterality Date  . CHOLECYSTECTOMY    . RETINAL DETACHMENT REPAIR W/ SCLERAL BUCKLE LE     Left       Home Medications    Prior to Admission medications   Medication Sig Start Date End Date Taking? Authorizing Provider  allopurinol (ZYLOPRIM) 100 MG tablet Take 1 tablet (100 mg total) by mouth daily. 10/03/16  Yes Brunetta Jeans, PA-C  atorvastatin (LIPITOR) 10 MG tablet Take 1 tablet (10 mg total) by mouth daily. 10/03/16  Yes Brunetta Jeans, PA-C  carvedilol (COREG) 25 MG tablet TAKE 1 TABLET BY MOUTH TWICE DAILY WITH A MEAL 10/05/16  Yes Brunetta Jeans, PA-C  EQ FIBER SUPPLEMENT PO Take 1 tablet by mouth daily.    Yes [provider]  fluticasone (FLONASE) 50 MCG/ACT nasal spray Place 2 sprays into both nostrils daily. Patient taking differently: Place 2 sprays into both nostrils daily as needed for allergies.  10/03/16  Yes Brunetta Jeans, PA-C  hydrALAZINE (APRESOLINE) 100 MG tablet Take 1 tablet (100 mg total) by mouth 3 (three) times daily. 10/05/16  Yes Brunetta Jeans, PA-C  LINZESS 145 MCG CAPS capsule Take 1 capsule by mouth daily as needed. Constipation 05/03/15  Yes  [provider]  spironolactone (ALDACTONE) 25 MG tablet Take 1 tablet (25 mg total) by mouth daily. 10/03/16  Yes Brunetta Jeans, PA-C  torsemide (DEMADEX) 20 MG tablet Take 1 tablet (20 mg total) by mouth 2 (two) times daily. 10/05/16  Yes Brunetta Jeans, PA-C  azithromycin (ZITHROMAX) 250 MG tablet Take 1 tablet (250 mg total) by mouth daily. Take first 2 tablets together, then 1 every day until finished. Patient not taking: Reported on 02/03/2017 01/02/17   Caccavale, Sophia, PA-C    promethazine-dextromethorphan (PROMETHAZINE-DM) 6.25-15 MG/5ML syrup Take 5 mLs by mouth 4 (four) times daily as needed for cough. Patient not taking: Reported on 02/03/2017 01/02/17   Caccavale, Sophia, PA-C  sildenafil (VIAGRA) 25 MG tablet Take 1 tablet (25 mg total) by mouth daily as needed for erectile dysfunction. 10/02/16   Brunetta Jeans, PA-C  UNABLE TO FIND CPAP Use nightly for OSA    [provider]    Family History Family History  Problem Relation Age of Onset  . Hypertension Mother   . Hyperlipidemia Mother   . Heart disease Mother   . Hypertension Maternal Grandmother   . Stroke Maternal Grandmother   . Heart disease Maternal Grandmother   . Hyperlipidemia Maternal Grandmother   . Stroke Maternal Grandfather   . Hypertension Maternal Grandfather   . Heart disease Maternal Grandfather   . Hyperlipidemia Maternal Grandfather   . Alzheimer's disease Maternal Grandfather   . Cancer - Other Paternal Grandmother   . Healthy Brother        x2  . Healthy Sister        x2  . Healthy Son        x2  . Healthy Daughter        x1  . Diabetes Neg Hx   . Heart attack Neg Hx   . Sudden death Neg Hx     Social History Social History   Tobacco Use  . Smoking status: Never Smoker  . Smokeless tobacco: Never Used  Substance Use Topics  . Alcohol use: Yes    Comment: socially -- once every few months  . Drug use: No     Allergies   Patient has no known allergies.   Review of Systems Review of Systems  Constitutional: Negative for appetite change, diaphoresis, fatigue, fever and unexpected weight change.  HENT: Negative for mouth sores.   Eyes: Negative for visual disturbance.  Respiratory: Negative for cough, chest tightness, shortness of breath and wheezing.   Cardiovascular: Negative for chest pain.  Gastrointestinal: Negative for abdominal pain, constipation, diarrhea, nausea and vomiting.  Endocrine: Negative for polydipsia, polyphagia and  polyuria.  Genitourinary: Negative for dysuria, frequency, hematuria and urgency.  Musculoskeletal: Negative for back pain and neck stiffness.  Skin: Negative for rash.  Allergic/Immunologic: Negative for immunocompromised state.  Neurological: Positive for syncope and headaches. Negative for light-headedness.  Hematological: Does not bruise/bleed easily.  Psychiatric/Behavioral: Negative for sleep disturbance. The patient is not nervous/anxious.      Physical Exam Updated Vital Signs BP (!) 188/144 (BP Location: Left Arm)   Pulse 100   Temp 98.7 F (37.1 C) (Oral)   Resp 16   Ht 5' 8.5" (1.74 m)   Wt (!) 156.4 kg (344 lb 12.8 oz)   SpO2 98%   BMI 51.66 kg/m   Physical Exam  Constitutional: He is oriented to person, place, and time. He appears well-developed and well-nourished. No distress.  HENT:  Head: Normocephalic and atraumatic.  Mouth/Throat: Oropharynx is clear and moist.  Eyes: Conjunctivae and EOM are normal. Pupils are equal, round, and reactive to light. No scleral icterus.  No horizontal, vertical or rotational nystagmus  Neck: Normal range of motion. Neck supple.  Full active and passive ROM without pain No midline or paraspinal tenderness No nuchal rigidity or meningeal signs  Cardiovascular: Normal rate, regular rhythm and intact distal pulses.  Pulses:      Radial pulses are 2+ on the right side, and 2+ on the left side.       Dorsalis pedis pulses are 2+ on the right side, and 2+ on the left side.  Pulmonary/Chest: Effort normal and breath sounds normal. No respiratory distress. He has no wheezes. He has no rales.  Abdominal: Soft. Bowel sounds are normal. There is no tenderness. There is no rebound and no guarding.  Musculoskeletal: Normal range of motion.  Lymphadenopathy:    He has no cervical adenopathy.  Neurological: He is alert and oriented to person, place, and time. No cranial nerve deficit. He exhibits normal muscle tone. Coordination normal.    Mental Status:  Alert, oriented, thought content appropriate. Speech fluent without evidence of aphasia. Able to follow 2 step commands without difficulty.  Cranial Nerves:  II:  Peripheral visual fields grossly normal, pupils equal, round, reactive to light III,IV, VI: ptosis not present, extra-ocular motions intact bilaterally  V,VII: smile symmetric, facial light touch sensation equal VIII: hearing grossly normal bilaterally  IX,X: midline uvula rise  XI: bilateral shoulder shrug equal and strong XII: midline tongue extension  Motor:  5/5 in upper and lower extremities bilaterally including strong and equal grip strength and dorsiflexion/plantar flexion Sensory: Pinprick and light touch normal in all extremities.  Cerebellar: normal finger-to-nose with bilateral upper extremities Gait: normal gait and balance CV: distal pulses palpable throughout   Skin: Skin is warm and dry. No rash noted. He is not diaphoretic.  Psychiatric: He has a normal mood and affect. His behavior is normal. Judgment and thought content normal.  Nursing note and vitals reviewed.    ED Treatments / Results  Labs (all labs ordered are listed, but only abnormal results are displayed) Labs Reviewed  BASIC METABOLIC PANEL - Abnormal; Notable for the following components:      Result Value   Potassium 3.3 (*)    Glucose, Bld 102 (*)    BUN 22 (*)    Creatinine, Ser 3.32 (*)    Calcium 8.7 (*)    GFR calc non Af Amer 22 (*)    GFR calc Af Amer 25 (*)    All other components within normal limits  CBC - Abnormal; Notable for the following components:   MCV 73.4 (*)    MCH 22.9 (*)    RDW 17.3 (*)    All other components within normal limits  URINALYSIS, ROUTINE W REFLEX MICROSCOPIC - Abnormal; Notable for the following components:   Protein, ur 100 (*)    Bacteria, UA RARE (*)    All other components within normal limits  BRAIN NATRIURETIC PEPTIDE - Abnormal; Notable for the following components:   B  Natriuretic Peptide 271.5 (*)    All other components within normal limits  CBG MONITORING, ED  I-STAT TROPONIN, ED  I-STAT TROPONIN, ED    EKG  EKG Interpretation  Date/Time:  Saturday February 03 2017 02:29:25 EST Ventricular Rate:  96 PR Interval:    QRS Duration: 117 QT Interval:  385 QTC Calculation: 487 R Axis:  5 Text Interpretation:  Sinus rhythm Probable left atrial enlargement Left ventricular hypertrophy Anterior Q waves, possibly due to LVH Abnormal T, consider ischemia, lateral leads When compared with ECG of 08/21/2015, No significant change was found Confirmed by Delora Fuel ([id number]) on 02/03/2017 2:33:30 AM       Radiology Dg Chest 2 View  Result Date: 02/03/2017 CLINICAL DATA:  37 year old male with syncope. EXAM: CHEST  2 VIEW COMPARISON:  Chest radiograph dated 01/02/2017 FINDINGS: There is cardiomegaly with mild vascular congestion. No focal consolidation, pleural effusion, or pneumothorax. No acute osseous pathology. IMPRESSION: 1. Cardiomegaly with mild vascular congestion. 2. No focal consolidation. Electronically Signed   By: Anner Crete M.D.   On: 02/03/2017 06:12   Ct Head Wo Contrast  Result Date: 02/03/2017 CLINICAL DATA:  Headache and syncope EXAM: CT HEAD WITHOUT CONTRAST TECHNIQUE: Contiguous axial images were obtained from the base of the skull through the vertex without intravenous contrast. COMPARISON:  Head CT 08/21/2015 FINDINGS: Brain: No mass lesion, intraparenchymal hemorrhage or extra-axial collection. No evidence of acute cortical infarct. Brain parenchyma and CSF-containing spaces are normal for age. Vascular: No hyperdense vessel or unexpected calcification. Skull: Normal visualized skull base, calvarium and extracranial soft tissues. Sinuses/Orbits: No sinus fluid levels or advanced mucosal thickening. No mastoid effusion. Normal orbits. IMPRESSION: Normal head CT. Electronically Signed   By: Ulyses Jarred M.D.   On: 02/03/2017 03:20     Procedures Procedures (including critical care time)  Medications Ordered in ED Medications  potassium chloride SA (K-DUR,KLOR-CON) CR tablet 40 mEq (not administered)  hydrALAZINE (APRESOLINE) injection 10 mg (10 mg Intravenous Given 02/03/17 0631)     Initial Impression / Assessment and Plan / ED Course  I have reviewed the triage vital signs and the nursing notes.  Pertinent labs & imaging results that were available during my care of the patient were reviewed by me and considered in my medical decision making (see chart for details).  Clinical Course as of Feb 04 723  Sat Feb 03, 2017  0722 Patient with severe sleep apnea and hypoxia only occurs when patient is asleep in bed. SpO2: (!) 87 % [HM]  0725 HTN.  Pt with hx of same BP: (!) 188/144 [HM]    Clinical Course User Index [HM] Careena Degraffenreid, Jarrett Soho, PA-C     Patient without arrhythmia or tachycardia while here in the department.  Patient normal hematocrit, normal ECG, no shortness of breath and systolic blood pressure greater than 90. Delta trop neg. no chest pain or shortness of breath.  Doubt ACS.  No evidence of CHF exacerbation. Will plan for discharge home with close cardiology/PCP follow-up.  Possibility of recurrent syncope has been discussed. I discussed reasons to avoid driving until cardiology/PCP followup and other safety prevention including use of ladders and working at heights.   Pt has remained hemodynamically stable throughout their time in the ED  BP (!) 150/111   Pulse 90   Temp 98.7 F (37.1 C) (Oral)   Resp (!) 37   Ht 5' 8.5" (1.74 m)   Wt (!) 156.4 kg (344 lb 12.8 oz)   SpO2 (!) 89%   BMI 51.66 kg/m    The patient was discussed with and seen by Dr. Roxanne Mins who agrees with the treatment plan.   Final Clinical Impressions(s) / ED Diagnoses   Final diagnoses:  Hypertension, unspecified type  Chronic kidney disease, unspecified CKD stage  Bad headache  Syncope and collapse    ED  Discharge Orders  None       Sally-Ann Cutbirth, Gwenlyn Perking 81/84/03 7543    Delora Fuel, MD 60/67/70 2242

## 2017-02-06 ENCOUNTER — Inpatient Hospital Stay: Payer: Self-pay | Admitting: Physician Assistant

## 2017-02-06 DIAGNOSIS — Z0289 Encounter for other administrative examinations: Secondary | ICD-10-CM

## 2017-03-12 ENCOUNTER — Other Ambulatory Visit: Payer: Self-pay | Admitting: Physician Assistant

## 2017-03-12 DIAGNOSIS — I10 Essential (primary) hypertension: Secondary | ICD-10-CM

## 2017-03-13 ENCOUNTER — Encounter: Payer: Self-pay | Admitting: Emergency Medicine

## 2017-04-01 ENCOUNTER — Other Ambulatory Visit: Payer: Self-pay | Admitting: Physician Assistant

## 2017-04-05 ENCOUNTER — Other Ambulatory Visit: Payer: Self-pay | Admitting: Physician Assistant

## 2017-04-05 DIAGNOSIS — I10 Essential (primary) hypertension: Secondary | ICD-10-CM

## 2017-04-18 ENCOUNTER — Telehealth: Payer: Self-pay | Admitting: *Deleted

## 2017-04-18 NOTE — Telephone Encounter (Signed)
Copied from [place]. Topic: Appointment Scheduling - Scheduling Inquiry for Clinic >> Apr 18, 2017 10:10 AM Lennox Solders wrote: Reason for CRM: pt moved to [place] area and would like to switch a provider who is accepting transfer patient

## 2017-04-18 NOTE — Telephone Encounter (Signed)
Fine with me

## 2017-04-27 ENCOUNTER — Other Ambulatory Visit: Payer: Self-pay | Admitting: Physician Assistant

## 2017-04-27 NOTE — Telephone Encounter (Signed)
Will route to Robin to see if she can assist with getting patient set up.

## 2017-04-27 NOTE — Telephone Encounter (Signed)
Appointment 5/10 with debbie

## 2017-05-14 ENCOUNTER — Other Ambulatory Visit: Payer: Self-pay | Admitting: Physician Assistant

## 2017-05-14 DIAGNOSIS — I10 Essential (primary) hypertension: Secondary | ICD-10-CM

## 2017-05-14 NOTE — Telephone Encounter (Signed)
Last OV 10/02/16, No Future OV--- Had a No Show OV  Last filled 10/05/16, # 60 with 3 refills  I see that patient is going to transfer to [facility].  Please advise.

## 2017-05-22 ENCOUNTER — Telehealth: Payer: Self-pay | Admitting: Physician Assistant

## 2017-05-22 NOTE — Telephone Encounter (Signed)
Copied from [phone]. Topic: Quick Communication - Rx Refill/Question >> May 22, 2017 11:03 AM Synthia Innocent wrote: Medication: allopurinol (ZYLOPRIM) 100 MG tablet, torsemide (DEMADEX) 20 MG tablet, spironolactone (ALDACTONE) 25 MG tablet , hydrALAZINE (APRESOLINE) 100 MG tablet and carvedilol (COREG) 25 MG tablet  Has the patient contacted their pharmacy? Yes.   (Agent: If no, request that the patient contact the pharmacy for the refill.) Preferred Pharmacy (with phone number or street name): [facility] on [address]: Please be advised that RX refills may take up to 3 business days. We ask that you follow-up with your pharmacy.

## 2017-05-23 ENCOUNTER — Other Ambulatory Visit: Payer: Self-pay

## 2017-05-23 DIAGNOSIS — I10 Essential (primary) hypertension: Secondary | ICD-10-CM

## 2017-05-23 MED ORDER — HYDRALAZINE HCL 100 MG PO TABS: 100.0000 mg | ORAL_TABLET | Freq: Three times a day (TID) | ORAL | 0 refills | Status: DC

## 2017-05-23 MED ORDER — SPIRONOLACTONE 25 MG PO TABS: 25.0000 mg | ORAL_TABLET | Freq: Every day | ORAL | 0 refills | Status: DC

## 2017-05-23 MED ORDER — ALLOPURINOL 100 MG PO TABS: 100.0000 mg | ORAL_TABLET | Freq: Every day | ORAL | 0 refills | Status: DC

## 2017-05-25 ENCOUNTER — Ambulatory Visit: Payer: Managed Care, Other (non HMO) | Admitting: Family Medicine

## 2017-06-04 LAB — CBC AND DIFFERENTIAL
HEMATOCRIT: 37 — AB (ref 41–53)
HEMOGLOBIN: 12 — AB (ref 13.5–17.5)
Platelets: 302 (ref 150–399)
WBC: 9.1

## 2017-06-04 LAB — BASIC METABOLIC PANEL
BUN: 40 — AB (ref 4–21)
Creatinine: 3.6 — AB (ref 0.6–1.3)
GLUCOSE: 91
SODIUM: 137 (ref 137–147)

## 2017-06-04 LAB — HEPATIC FUNCTION PANEL
ALK PHOS: 87 (ref 25–125)
ALT: 14 (ref 10–40)
AST: 18 (ref 14–40)
BILIRUBIN, TOTAL: 0.8

## 2017-06-13 ENCOUNTER — Ambulatory Visit (INDEPENDENT_AMBULATORY_CARE_PROVIDER_SITE_OTHER): Payer: Managed Care, Other (non HMO) | Admitting: Family Medicine

## 2017-06-13 ENCOUNTER — Encounter: Payer: Self-pay | Admitting: Family Medicine

## 2017-06-13 VITALS — BP 136/98 | HR 77 | Temp 97.7°F | Ht 68.5 in | Wt 342.0 lb

## 2017-06-13 DIAGNOSIS — G4733 Obstructive sleep apnea (adult) (pediatric): Secondary | ICD-10-CM | POA: Diagnosis not present

## 2017-06-13 DIAGNOSIS — I10 Essential (primary) hypertension: Secondary | ICD-10-CM | POA: Diagnosis not present

## 2017-06-13 DIAGNOSIS — I428 Other cardiomyopathies: Secondary | ICD-10-CM | POA: Diagnosis not present

## 2017-06-13 DIAGNOSIS — E785 Hyperlipidemia, unspecified: Secondary | ICD-10-CM

## 2017-06-13 DIAGNOSIS — I509 Heart failure, unspecified: Secondary | ICD-10-CM | POA: Diagnosis not present

## 2017-06-13 DIAGNOSIS — N184 Chronic kidney disease, stage 4 (severe): Secondary | ICD-10-CM

## 2017-06-13 LAB — CBC WITH DIFFERENTIAL/PLATELET
Basophils Absolute: 0.1 10*3/uL (ref 0.0–0.1)
Basophils Relative: 0.6 % (ref 0.0–3.0)
EOS ABS: 0.2 10*3/uL (ref 0.0–0.7)
Eosinophils Relative: 2.1 % (ref 0.0–5.0)
HCT: 40 % (ref 39.0–52.0)
HEMOGLOBIN: 12.6 g/dL — AB (ref 13.0–17.0)
LYMPHS ABS: 1.3 10*3/uL (ref 0.7–4.0)
Lymphocytes Relative: 14.4 % (ref 12.0–46.0)
MCHC: 31.5 g/dL (ref 30.0–36.0)
MCV: 70.2 fl — ABNORMAL LOW (ref 78.0–100.0)
MONO ABS: 1 10*3/uL (ref 0.1–1.0)
Monocytes Relative: 11.2 % (ref 3.0–12.0)
NEUTROS ABS: 6.4 10*3/uL (ref 1.4–7.7)
NEUTROS PCT: 71.7 % (ref 43.0–77.0)
PLATELETS: 312 10*3/uL (ref 150.0–400.0)
RBC: 5.7 Mil/uL (ref 4.22–5.81)
RDW: 18.6 % — ABNORMAL HIGH (ref 11.5–15.5)
WBC: 8.9 10*3/uL (ref 4.0–10.5)

## 2017-06-13 LAB — COMPREHENSIVE METABOLIC PANEL
ALBUMIN: 3.8 g/dL (ref 3.5–5.2)
ALT: 16 U/L (ref 0–53)
AST: 19 U/L (ref 0–37)
Alkaline Phosphatase: 72 U/L (ref 39–117)
BUN: 34 mg/dL — AB (ref 6–23)
CO2: 29 mEq/L (ref 19–32)
CREATININE: 3.63 mg/dL — AB (ref 0.40–1.50)
Calcium: 9.4 mg/dL (ref 8.4–10.5)
Chloride: 101 mEq/L (ref 96–112)
GFR: 24.12 mL/min — ABNORMAL LOW (ref >60.00)
GLUCOSE: 111 mg/dL — AB (ref 70–99)
Potassium: 3.5 mEq/L (ref 3.5–5.1)
SODIUM: 139 meq/L (ref 135–145)
TOTAL PROTEIN: 7.7 g/dL (ref 6.0–8.3)
Total Bilirubin: 0.4 mg/dL (ref 0.2–1.2)

## 2017-06-13 LAB — LIPID PANEL
CHOLESTEROL: 176 mg/dL (ref 0–200)
HDL: 34 mg/dL — ABNORMAL LOW (ref >39.00)
LDL Cholesterol: 102 mg/dL — ABNORMAL HIGH (ref 0–99)
NonHDL: 141.93
TRIGLYCERIDES: 198 mg/dL — AB (ref 0.0–149.0)
Total CHOL/HDL Ratio: 5
VLDL: 39.6 mg/dL (ref 0.0–40.0)

## 2017-06-13 LAB — HEMOGLOBIN A1C: HEMOGLOBIN A1C: 5.7 % (ref 4.6–6.5)

## 2017-06-13 LAB — TSH: TSH: 3.67 u[IU]/mL (ref 0.35–4.50)

## 2017-06-13 NOTE — Progress Notes (Signed)
Subjective:    Patient ID: Frank Coffey, male    DOB: 07-11-1980, 37 y.o.   MRN: [id number]  HPI This is a 37 yo male who presents today to establish care. Previously saw Frank Coffey. 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 Very busy, stressful life.   Last CPE- 02/18/16 Tdap- 01/17/2007 Flu- annual Dental- has appointment tomorrow Eye- about a year ago Exercise- has been walking 3 days a week with his girlfriend and some friends Sleep- uses CPAP, sleeps 6 hours a night  CHF- last ECHO 12/09/14 showed EF 30%, does not have follow up scheduled with cardiology. He denies chest pain, SOB, has some LE edema.   CKD stage 3- sees Dr. Jimmy Coffey (nephrology), had visit last week, had blood work done  Morbid obesity- he reports that he was around 400 pounds and has lost some weight over last couple of years. Has met with dietician in the past which was helpful. Is interested in bariatric surgery.   He denies visual changes, neck/back pain, abdominal pain/nasuea/vomiting/diarrhea.   Past Medical History:  Diagnosis Date  . CHF (congestive heart failure) ([place])   . Chronic kidney disease, stage 3, mod decreased GFR (HCC)   . Gout   . 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 Hx of migraine headaches   . Hyperlipidemia   . Hypertension   . 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 Obesity   . OSA (obstructive sleep apnea)    Past Surgical History:  Procedure Laterality Date  . CHOLECYSTECTOMY    . RETINAL DETACHMENT REPAIR W/ SCLERAL BUCKLE LE     Left   Family History  Problem Relation Age of Onset  . Hypertension Mother   . Hyperlipidemia Mother   . Heart disease Mother   . Hypertension Maternal Grandmother   . Stroke Maternal Grandmother   . Heart disease Maternal Grandmother   . Hyperlipidemia Maternal Grandmother   . Stroke Maternal Grandfather   . Hypertension Maternal  Grandfather   . Heart disease Maternal Grandfather   . Hyperlipidemia Maternal Grandfather   . Alzheimer's disease Maternal Grandfather   . Cancer - Other Paternal Grandmother   . Healthy Brother        x2  . Healthy Sister        x2  . Healthy Son        x2  . Healthy Daughter        x1  . Diabetes Neg Hx   . Heart attack Neg Hx   . Sudden death Neg Hx    Social History   Tobacco Use  . Smoking status: Never Smoker  . Smokeless tobacco: Never Used  Substance Use Topics  . Alcohol use: Yes    Comment: socially -- once every few months  . Drug use: No     Review of Systems Per HPI    Objective:   Physical Exam Physical Exam  Constitutional: Oriented to person, place, and time. He appears well-developed and well-nourished.  HENT:  Head: Normocephalic and atraumatic.  Eyes: Conjunctivae are injected. Neck: Normal range of motion. Neck supple.  Cardiovascular: Normal rate, regular rhythm and normal heart sounds.   Pulmonary/Chest: Effort normal and breath sounds normal.  Musculoskeletal: trace pretibial edema.  Neurological: Alert and oriented to person, place, and time.  Skin: Skin is warm and dry.  Psychiatric: Normal mood and affect.  Behavior is normal. Judgment and thought content normal.  Vitals reviewed.     BP (!) 136/98 (BP Location: Right Arm, Patient Position: Sitting, Cuff Size: Large)   Pulse 77   Temp 97.7 F (36.5 C) (Oral)   Ht 5' 8.5" (1.74 m)   Wt (!) 342 lb (155.1 kg)   SpO2 96%   BMI 51.24 kg/m  Wt Readings from Last 3 Encounters:  06/13/17 (!) 342 lb (155.1 kg)  02/03/17 (!) 344 lb 12.8 oz (156.4 kg)  01/02/17 (!) 330 lb (149.7 kg)       Assessment & Plan:  1. Hyperlipidemia, unspecified hyperlipidemia type - Lipid Panel  2. Morbid obesity due to excess calories ([place]) - encouraged continued healthy food choices and regular exercises - Hemoglobin A1c - TSH - Amb Referral to Bariatric Surgery  3. CKD (chronic kidney disease)  stage 4, GFR 15-29 ml/min (HCC) - Amb Referral to Bariatric Surgery  4. Essential hypertension - Comprehensive metabolic panel - CBC with Differential - Amb Referral to Bariatric Surgery  5. OSA (obstructive sleep apnea) - Amb Referral to Bariatric Surgery - according to most recent home sleep study, he needs to have CPAP titration study, he was provided information to call and schedule  6. NICM (nonischemic cardiomyopathy) ([place]) - Amb Referral to Bariatric Surgery  7. CHF - follow up with cardiology- provided information to call and schedule an appointment  Frank Reamer, FNP-BC  [facility], [facility]  06/14/2017 11:26 AM

## 2017-06-13 NOTE — Patient Instructions (Addendum)
Please call [facility] and schedule sleep titration study [phone]  Please call Dr. Jonelle Sidle Randolf's office- cardiology to schedule follow up of your heart failure [phone]  I have put in an order for referral to Bariatric Surgery for consultation- please see a referral coordinator today before you leave  Please stop at the lab for blood work  Follow up in 6 months for complete physical exam

## 2017-06-14 ENCOUNTER — Encounter: Payer: Self-pay | Admitting: Family Medicine

## 2017-06-14 MED ORDER — ATORVASTATIN CALCIUM 20 MG PO TABS: 20.0000 mg | ORAL_TABLET | Freq: Every day | ORAL | 3 refills | Status: DC

## 2017-06-14 NOTE — Addendum Note (Signed)
Addended by: [name] B on: 06/14/2017 12:02 PM   Modules accepted: Orders

## 2017-06-18 ENCOUNTER — Encounter: Payer: Self-pay | Admitting: Family Medicine

## 2017-06-20 ENCOUNTER — Encounter: Payer: Self-pay | Admitting: Family Medicine

## 2017-07-04 ENCOUNTER — Encounter (HOSPITAL_COMMUNITY): Payer: Self-pay | Admitting: Emergency Medicine

## 2017-07-04 ENCOUNTER — Telehealth (HOSPITAL_COMMUNITY): Payer: Self-pay

## 2017-07-04 ENCOUNTER — Ambulatory Visit (HOSPITAL_COMMUNITY)
Admission: EM | Admit: 2017-07-04 | Discharge: 2017-07-04 | Disposition: A | Discharge status: Home / Self Care | Payer: Managed Care, Other (non HMO) | Attending: Urgent Care | Admitting: Urgent Care

## 2017-07-04 ENCOUNTER — Ambulatory Visit (INDEPENDENT_AMBULATORY_CARE_PROVIDER_SITE_OTHER): Payer: Managed Care, Other (non HMO)

## 2017-07-04 ENCOUNTER — Other Ambulatory Visit: Payer: Self-pay

## 2017-07-04 DIAGNOSIS — N184 Chronic kidney disease, stage 4 (severe): Secondary | ICD-10-CM

## 2017-07-04 DIAGNOSIS — I130 Hypertensive heart and chronic kidney disease with heart failure and stage 1 through stage 4 chronic kidney disease, or unspecified chronic kidney disease: Secondary | ICD-10-CM

## 2017-07-04 DIAGNOSIS — M25571 Pain in right ankle and joints of right foot: Secondary | ICD-10-CM | POA: Diagnosis present

## 2017-07-04 DIAGNOSIS — I10 Essential (primary) hypertension: Secondary | ICD-10-CM | POA: Diagnosis not present

## 2017-07-04 DIAGNOSIS — Z79899 Other long term (current) drug therapy: Secondary | ICD-10-CM | POA: Diagnosis not present

## 2017-07-04 DIAGNOSIS — M25471 Effusion, right ankle: Secondary | ICD-10-CM

## 2017-07-04 DIAGNOSIS — M109 Gout, unspecified: Secondary | ICD-10-CM

## 2017-07-04 DIAGNOSIS — M79662 Pain in left lower leg: Secondary | ICD-10-CM | POA: Diagnosis present

## 2017-07-04 DIAGNOSIS — M79661 Pain in right lower leg: Secondary | ICD-10-CM | POA: Diagnosis present

## 2017-07-04 DIAGNOSIS — M10071 Idiopathic gout, right ankle and foot: Secondary | ICD-10-CM

## 2017-07-04 DIAGNOSIS — I509 Heart failure, unspecified: Secondary | ICD-10-CM | POA: Insufficient documentation

## 2017-07-04 LAB — URIC ACID: Uric Acid, Serum: 6.3 mg/dL (ref 4.4–7.6)

## 2017-07-04 MED ORDER — PREDNISONE 10 MG PO TABS: 20.0000 mg | ORAL_TABLET | Freq: Every day | ORAL | 0 refills | Status: DC

## 2017-07-04 NOTE — ED Provider Notes (Addendum)
MRN: [id number] DOB: [birth year]  Subjective:   Frank Coffey is a 37 y.o. male presenting for 1 week history of bilateral lower leg pain and swelling.  Patient reports that his pain was initially worse over his right foot but has alleviated.  Now his pain and swelling is worse over his right ankle and feels similar type pain on his left ankle.  Patient has CKD stage IV, is managed by nephrology, just had his torsemide increased from 20 to 60 mg once daily about 2 weeks ago.  Denies fever, calf pain, calf swelling, calf redness, chest pain, shortness of breath, dyspnea, heart racing.  He has a history of essential hypertension, nonischemic cardiomyopathy, CHF.  Denies smoking cigarettes.  No current facility-administered medications for this encounter.   Current Outpatient Medications:  .  allopurinol (ZYLOPRIM) 300 MG tablet, , Disp: , Rfl:  .  atorvastatin (LIPITOR) 20 MG tablet, Take 1 tablet (20 mg total) by mouth daily., Disp: 90 tablet, Rfl: 3 .  carvedilol (COREG) 25 MG tablet, TAKE 1 TABLET BY MOUTH TWICE DAILY WITH A MEAL, Disp: 60 tablet, Rfl: 0 .  EQ FIBER SUPPLEMENT PO, Take 1 tablet by mouth daily. , Disp: , Rfl:  .  fluticasone (FLONASE) 50 MCG/ACT nasal spray, Place 2 sprays into both nostrils daily. (Patient taking differently: Place 2 sprays into both nostrils daily as needed for allergies. ), Disp: 16 g, Rfl: 6 .  furosemide (LASIX) 40 MG tablet, Take by mouth., Disp: , Rfl:  .  hydrALAZINE (APRESOLINE) 100 MG tablet, Take 1 tablet (100 mg total) by mouth 3 (three) times daily., Disp: 90 tablet, Rfl: 0 .  sildenafil (VIAGRA) 25 MG tablet, Take 1 tablet (25 mg total) by mouth daily as needed for erectile dysfunction., Disp: 10 tablet, Rfl: 0 .  spironolactone (ALDACTONE) 25 MG tablet, Take 1 tablet (25 mg total) by mouth daily., Disp: 30 tablet, Rfl: 0 .  torsemide (DEMADEX) 20 MG tablet, Take 1 tablet (20 mg total) by mouth 2 (two) times daily. (Patient taking differently: Take 60  mg by mouth 2 (two) times daily. ), Disp: 60 tablet, Rfl: 0 .  UNABLE TO FIND, CPAP Use nightly for OSA, Disp: , Rfl:    No Known Allergies  Past Medical History:  Diagnosis Date  . CHF (congestive heart failure) ([place])   . Chronic kidney disease, stage 3, mod decreased GFR (HCC)   . Gout   . 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 Hx of migraine headaches   . Hyperlipidemia   . Hypertension   . 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)   . Obesity   . OSA (obstructive sleep apnea)      Past Surgical History:  Procedure Laterality Date  . CHOLECYSTECTOMY    . RETINAL DETACHMENT REPAIR W/ SCLERAL BUCKLE LE     Left    Family History  Problem Relation Age of Onset  . Hypertension Mother   . Hyperlipidemia Mother   . Heart disease Mother   . Hypertension Maternal Grandmother   . Stroke Maternal Grandmother   . Heart disease Maternal Grandmother   . Hyperlipidemia Maternal Grandmother   . Stroke Maternal Grandfather   . Hypertension Maternal Grandfather   . Heart disease Maternal Grandfather   . Hyperlipidemia Maternal Grandfather   . Alzheimer's disease Maternal Grandfather   . Cancer - Other Paternal Grandmother   . Healthy Brother        x2  .  Healthy Sister        x2  . Healthy Son        x2  . Healthy Daughter        x1  . Diabetes Neg Hx   . Heart attack Neg Hx   . Sudden death Neg Hx      Objective:   Vitals: BP 131/69 (BP Location: Left Arm) Comment (BP Location): regular cuff.  forearm  Pulse 80   Temp 98.4 F (36.9 C) (Oral)   Resp (!) 22   SpO2 99%   Physical Exam  Constitutional: He is oriented to person, place, and time. He appears well-developed and well-nourished.  Cardiovascular: Normal rate.  Pulmonary/Chest: Effort normal.  Musculoskeletal:       Right ankle: He exhibits decreased range of motion and swelling (with associated warmth). He exhibits no ecchymosis, no deformity, no laceration and normal  pulse. Tenderness. Lateral malleolus and medial malleolus tenderness found. No AITFL, no CF ligament, no posterior TFL, no head of 5th metatarsal and no proximal fibula tenderness found. Achilles tendon exhibits no pain and no defect.       Right lower leg: He exhibits no tenderness, no bony tenderness, no swelling, no edema, no deformity and no laceration.       Legs: Neurological: He is alert and oriented to person, place, and time.   Dg Ankle Complete Right  Result Date: 07/04/2017 CLINICAL DATA:  Medial right ankle pain.  History of gout. EXAM: RIGHT ANKLE - COMPLETE 3+ VIEW COMPARISON:  Ankle x-rays dated July 14, 2016. FINDINGS: No acute fracture or dislocation. Irregularity and subchondral lucency in the medial talar dome again noted, with possible subchondral collapse on the oblique view. The ankle mortise is symmetric. Bone mineralization is normal. Small tibiotalar joint effusion. Mild tibiotalar and dorsal midfoot spurring. Plantar and Achilles enthesophytes. Mild diffuse soft tissue swelling about the ankle. 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 Consider non-emergent ankle MRI for further evaluation. Electronically Signed   By: Titus Dubin M.D.   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  Counseled on x-ray report and patient will try to follow-up with PCP or set up an appointment with an orthopedist to pursue an MRI as recommended on the report. Counseled patient on potential for adverse effects with medications prescribed today, patient verbalized understanding. Return-to-clinic precautions discussed,  patient verbalized understanding.  Follow-up with uric acid level.  Maintain all other medications.   Jaynee Eagles, PA-C 07/04/17 1228    Jaynee Eagles, PA-C 07/04/17 1229

## 2017-07-04 NOTE — ED Triage Notes (Signed)
Bilateral lower leg swelling since Sunday.  Lower legs are painful.  Denies chest pain.  Patient has been taking medicines as prescribed.

## 2017-07-04 NOTE — Telephone Encounter (Signed)
Results are within normal range. Pt contacted and made aware. Verbalized understanding.   

## 2017-07-04 NOTE — Discharge Instructions (Signed)
You can set up an office visit with your PCP to try to and obtain a referral for an MRI of your ankle or contact the following orthopedic practices on your own: [facility]  In the meantime, we will use 20mg  prednisone for inflammation which can address pain as well.

## 2017-08-17 ENCOUNTER — Encounter (HOSPITAL_COMMUNITY): Payer: Self-pay | Admitting: Physician Assistant

## 2017-08-17 ENCOUNTER — Other Ambulatory Visit: Payer: Self-pay

## 2017-08-17 ENCOUNTER — Ambulatory Visit (HOSPITAL_COMMUNITY)
Admission: EM | Admit: 2017-08-17 | Discharge: 2017-08-17 | Disposition: A | Discharge status: Home / Self Care | Payer: Managed Care, Other (non HMO) | Attending: Internal Medicine | Admitting: Internal Medicine

## 2017-08-17 DIAGNOSIS — I10 Essential (primary) hypertension: Secondary | ICD-10-CM

## 2017-08-17 DIAGNOSIS — M79672 Pain in left foot: Secondary | ICD-10-CM

## 2017-08-17 MED ORDER — CLONIDINE HCL 0.1 MG PO TABS
0.2000 mg | ORAL_TABLET | Freq: Once | ORAL | Status: AC
  Administered 2017-08-17: 0.2 mg via ORAL

## 2017-08-17 MED ORDER — MELOXICAM 7.5 MG PO TABS: 7.5000 mg | ORAL_TABLET | Freq: Every day | ORAL | 0 refills | Status: DC

## 2017-08-17 MED ORDER — CLONIDINE HCL 0.1 MG PO TABS
ORAL_TABLET | ORAL | Status: AC
  Filled 2017-08-17: qty 2

## 2017-08-17 NOTE — Discharge Instructions (Addendum)
History and exam was consistent with tendon inflammation. Start Mobic. Do not take ibuprofen (motrin/advil)/ naproxen (aleve) while on mobic.  Ice compress, elevation, rest.  Follow-up with PCP for further evaluation if symptoms not improving.  Your blood pressure has lowered to a safer range, though still high, after catapres. Please take your blood pressure medicine as soon as possible.  If still with elevated blood pressure, having chest pain, shortness of breath, headache/blurry vision, weakness, dizziness, confusion, passing out, go to emergency department for further evaluation.

## 2017-08-17 NOTE — ED Provider Notes (Signed)
[facility]    CSN: [id number] Arrival date & time: 08/17/17  1510     History   Chief Complaint Chief Complaint  Patient presents with  . Foot Pain    HPI Frank Coffey is a 37 y.o. male.   37 year old male comes in with 3-day history of left heel pain.  States worse in the morning, and slowly decreases throughout the day.  However, also more painful during weightbearing.  Denies radiation of pain.  Denies injury/trauma.  Denies numbness, tingling.  Has not taken anything for the symptoms.   Patient was found hypertensive on triage.  States has not taken the second dose of his blood pressure medication.  He denies chest pain, shortness of breath, palpitation.  Denies weakness, lightheadedness.  States had headache earlier that has since resolved.  He has also had dizziness earlier that has since resolved.     Past Medical History:  Diagnosis Date  . CHF (congestive heart failure) ([place])   . Chronic kidney disease, stage 3, mod decreased GFR (HCC)   . Gout   . 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 Hx of migraine headaches   . Hyperlipidemia   . Hypertension   . 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 Obesity   . OSA (obstructive sleep apnea)     Patient Active Problem List   Diagnosis Date Noted  . Migraine 10/02/2016  . Erectile dysfunction 10/02/2016  . Annual physical exam 02/22/2016  . Onychomycosis 02/22/2016  . Pre-operative cardiovascular examination 07/19/2015  . NICM (nonischemic cardiomyopathy) ([place]) 07/19/2015  . Morbid obesity due to excess calories ([place]) 07/19/2015  . 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 CHF (congestive heart failure) ([place]) 12/19/2012  . Benign hypertension with chronic kidney disease, stage IV ([place]) 12/19/2012  . Gout 12/19/2012  . CKD (chronic kidney disease) stage 4, GFR 15-29 ml/min (HCC) 12/19/2012  . OSA (obstructive sleep apnea) 12/19/2012    Past Surgical  History:  Procedure Laterality Date  . CHOLECYSTECTOMY    . RETINAL DETACHMENT REPAIR W/ SCLERAL BUCKLE LE     Left       Home Medications    Prior to Admission medications   Medication Sig Start Date End Date Taking? Authorizing Provider  allopurinol (ZYLOPRIM) 300 MG tablet 400 mg.  06/04/17   [provider]  atorvastatin (LIPITOR) 20 MG tablet Take 1 tablet (20 mg total) by mouth daily. 06/14/17   Elby Beck, FNP  carvedilol (COREG) 25 MG tablet TAKE 1 TABLET BY MOUTH TWICE DAILY WITH A MEAL 05/14/17   Brunetta Jeans, PA-C  EQ FIBER SUPPLEMENT PO Take 1 tablet by mouth daily.     [provider]  fluticasone (FLONASE) 50 MCG/ACT nasal spray Place 2 sprays into both nostrils daily. Patient taking differently: Place 2 sprays into both nostrils daily as needed for allergies.  10/03/16   Brunetta Jeans, PA-C  hydrALAZINE (APRESOLINE) 100 MG tablet Take 1 tablet (100 mg total) by mouth 3 (three) times daily. 05/23/17   Brunetta Jeans, PA-C  meloxicam (MOBIC) 7.5 MG tablet Take 1 tablet (7.5 mg total) by mouth daily. 08/17/17   Tasia Catchings, Burnice Vassel V, PA-C  sildenafil (VIAGRA) 25 MG tablet Take 1 tablet (25 mg total) by mouth daily as needed for erectile dysfunction. 10/02/16   Brunetta Jeans, PA-C  spironolactone (ALDACTONE) 25 MG tablet Take 1 tablet (25 mg total) by mouth daily. 05/23/17   Hassell Done,  Luanna Cole, PA-C  torsemide (DEMADEX) 20 MG tablet Take 1 tablet (20 mg total) by mouth 2 (two) times daily. Patient taking differently: Take 60 mg by mouth 2 (two) times daily.  04/27/17   Brunetta Jeans, PA-C  UNABLE TO FIND CPAP Use nightly for OSA    [provider]    Family History Family History  Problem Relation Age of Onset  . Hypertension Mother   . Hyperlipidemia Mother   . Heart disease Mother   . Hypertension Maternal Grandmother   . Stroke Maternal Grandmother   . Heart disease Maternal Grandmother   . Hyperlipidemia Maternal Grandmother   .  Stroke Maternal Grandfather   . Hypertension Maternal Grandfather   . Heart disease Maternal Grandfather   . Hyperlipidemia Maternal Grandfather   . Alzheimer's disease Maternal Grandfather   . Cancer - Other Paternal Grandmother   . Healthy Brother        x2  . Healthy Sister        x2  . Healthy Son        x2  . Healthy Daughter        x1  . Diabetes Neg Hx   . Heart attack Neg Hx   . Sudden death Neg Hx     Social History Social History   Tobacco Use  . Smoking status: Never Smoker  . Smokeless tobacco: Never Used  Substance Use Topics  . Alcohol use: Yes    Comment: socially -- once every few months  . Drug use: No     Allergies   Patient has no known allergies.   Review of Systems Review of Systems  Reason unable to perform ROS: See HPI as above.     Physical Exam Triage Vital Signs ED Triage Vitals  Enc Vitals Group     BP 08/17/17 1605 (!) 180/126     Pulse Rate 08/17/17 1605 76     Resp 08/17/17 1602 (!) 21     Temp 08/17/17 1602 98.1 F (36.7 C)     Temp Source 08/17/17 1602 Oral     SpO2 08/17/17 1602 98 %     Weight 08/17/17 1606 (!) 322 lb (146.1 kg)     Height --      Head Circumference --      Peak Flow --      Pain Score 08/17/17 1606 7     Pain Loc --      Pain Edu? --      Excl. in [place]? --    No data found.  Updated Vital Signs BP (!) 175/120   Pulse 76   Temp 98.1 F (36.7 C) (Oral)   Resp (!) 21   Wt (!) 322 lb (146.1 kg)   SpO2 98%   BMI 48.25 kg/m   Physical Exam  Constitutional: He is oriented to person, place, and time. He appears well-developed and well-nourished. No distress.  HENT:  Head: Normocephalic and atraumatic.  Eyes: Pupils are equal, round, and reactive to light. Conjunctivae are normal.  Cardiovascular: Normal rate, regular rhythm and normal heart sounds. Exam reveals no gallop and no friction rub.  No murmur heard. Pulmonary/Chest: Effort normal and breath sounds normal. No stridor. No respiratory  distress. He has no wheezes. He has no rales.  Musculoskeletal:  No swelling, erythema, increased warmth, contusion seen.  Tenderness to palpation of left posterior heel, as well as Achilles tendon.  Increased tenderness with dorsiflexion of the toes.  Full  range of motion of ankle and toes.  Strength normal and equal bilaterally.  Sensation intact ankle bilaterally.  Pedal pulse 2+ and equal bilaterally.   Neurological: He is alert and oriented to person, place, and time. He has normal strength. He is not disoriented. No cranial nerve deficit or sensory deficit. He displays a negative Romberg sign. Coordination and gait normal. GCS eye subscore is 4. GCS verbal subscore is 5. GCS motor subscore is 6.  Normal finger-to-nose, rapid movement.  Skin: He is not diaphoretic.     UC Treatments / Results  Labs (all labs ordered are listed, but only abnormal results are displayed) Labs Reviewed - No data to display  EKG None  Radiology No results found.  Procedures Procedures (including critical care time)  Medications Ordered in UC Medications  cloNIDine (CATAPRES) tablet 0.2 mg (0.2 mg Oral Given 08/17/17 1731)    Initial Impression / Assessment and Plan / UC Course  I have reviewed the triage vital signs and the nursing notes.  Pertinent labs & imaging results that were available during my care of the patient were reviewed by me and considered in my medical decision making (see chart for details).    Discussed with patient, history and exam of left heel pain more consistent with tendinitis.  Mobic as directed.  Ice compress, elevation, rest.  Return precautions given.  Patient with hypertension at triage, 190/96.  At  recheck, blood pressure was 220/113.  Patient was given 0.2 mg of Catapres.  He remains asymptomatic without chest pain, shortness of breath, palpitations, weakness, dizziness, syncope, headache/blurry vision.  EKG normal sinus rhythm, 85 bpm, LVH, T wave abnormality  unchanged from prior EKG.  EKG also reviewed by Dr. Sabra Heck.  Patient with improvement of pressure after Catapres.  Will have patient  take his normal antihypertensive medication as soon as possible.  Patient discharged in stable condition, strict return precautions given.  Patient expresses understanding and agrees to plan.  Final Clinical Impressions(s) / UC Diagnoses   Final diagnoses:  Pain of left heel  Hypertension, unspecified type   ED Prescriptions    Medication Sig Dispense Auth. Provider   meloxicam (MOBIC) 7.5 MG tablet Take 1 tablet (7.5 mg total) by mouth daily. 15 tablet Tobin Chad, [place] 08/17/17 1913

## 2017-08-17 NOTE — ED Triage Notes (Signed)
Left foot pain mainly the heel.

## 2017-10-29 ENCOUNTER — Emergency Department (HOSPITAL_COMMUNITY)
Admission: EM | Admit: 2017-10-29 | Discharge: 2017-10-29 | Disposition: A | Discharge status: Home / Self Care | Payer: Managed Care, Other (non HMO) | Attending: Emergency Medicine | Admitting: Emergency Medicine

## 2017-10-29 ENCOUNTER — Encounter (HOSPITAL_COMMUNITY): Payer: Self-pay | Admitting: Emergency Medicine

## 2017-10-29 ENCOUNTER — Emergency Department (HOSPITAL_COMMUNITY): Payer: Managed Care, Other (non HMO)

## 2017-10-29 ENCOUNTER — Other Ambulatory Visit: Payer: Self-pay

## 2017-10-29 DIAGNOSIS — R05 Cough: Secondary | ICD-10-CM | POA: Diagnosis not present

## 2017-10-29 DIAGNOSIS — I509 Heart failure, unspecified: Secondary | ICD-10-CM | POA: Diagnosis not present

## 2017-10-29 DIAGNOSIS — I130 Hypertensive heart and chronic kidney disease with heart failure and stage 1 through stage 4 chronic kidney disease, or unspecified chronic kidney disease: Secondary | ICD-10-CM | POA: Diagnosis not present

## 2017-10-29 DIAGNOSIS — N183 Chronic kidney disease, stage 3 (moderate): Secondary | ICD-10-CM | POA: Diagnosis not present

## 2017-10-29 DIAGNOSIS — Z79899 Other long term (current) drug therapy: Secondary | ICD-10-CM | POA: Insufficient documentation

## 2017-10-29 DIAGNOSIS — R0602 Shortness of breath: Secondary | ICD-10-CM | POA: Diagnosis present

## 2017-10-29 DIAGNOSIS — R059 Cough, unspecified: Secondary | ICD-10-CM

## 2017-10-29 LAB — CBC WITH DIFFERENTIAL/PLATELET
Abs Immature Granulocytes: 0.04 10*3/uL (ref 0.00–0.07)
BASOS ABS: 0 10*3/uL (ref 0.0–0.1)
BASOS PCT: 0 %
EOS PCT: 3 %
Eosinophils Absolute: 0.4 10*3/uL (ref 0.0–0.5)
HCT: 39.7 % (ref 39.0–52.0)
Hemoglobin: 11.5 g/dL — ABNORMAL LOW (ref 13.0–17.0)
IMMATURE GRANULOCYTES: 0 %
Lymphocytes Relative: 13 %
Lymphs Abs: 1.6 10*3/uL (ref 0.7–4.0)
MCH: 20.5 pg — ABNORMAL LOW (ref 26.0–34.0)
MCHC: 29 g/dL — AB (ref 30.0–36.0)
MCV: 70.9 fL — ABNORMAL LOW (ref 80.0–100.0)
Monocytes Absolute: 1.4 10*3/uL — ABNORMAL HIGH (ref 0.1–1.0)
Monocytes Relative: 11 %
NRBC: 0 % (ref 0.0–0.2)
Neutro Abs: 9.2 10*3/uL — ABNORMAL HIGH (ref 1.7–7.7)
Neutrophils Relative %: 73 %
PLATELETS: 292 10*3/uL (ref 150–400)
RBC: 5.6 MIL/uL (ref 4.22–5.81)
RDW: 19.7 % — AB (ref 11.5–15.5)
WBC: 12.6 10*3/uL — AB (ref 4.0–10.5)

## 2017-10-29 LAB — BASIC METABOLIC PANEL
Anion gap: 11 (ref 5–15)
BUN: 29 mg/dL — ABNORMAL HIGH (ref 6–20)
CO2: 28 mmol/L (ref 22–32)
Calcium: 8.8 mg/dL — ABNORMAL LOW (ref 8.9–10.3)
Chloride: 101 mmol/L (ref 98–111)
Creatinine, Ser: 3.68 mg/dL — ABNORMAL HIGH (ref 0.61–1.24)
GFR calc non Af Amer: 19 mL/min — ABNORMAL LOW (ref >60)
GFR, EST AFRICAN AMERICAN: 22 mL/min — AB (ref >60)
Glucose, Bld: 108 mg/dL — ABNORMAL HIGH (ref 70–99)
Potassium: 3 mmol/L — ABNORMAL LOW (ref 3.5–5.1)
Sodium: 140 mmol/L (ref 135–145)

## 2017-10-29 LAB — BRAIN NATRIURETIC PEPTIDE: B Natriuretic Peptide: 100 pg/mL (ref 0.0–100.0)

## 2017-10-29 LAB — I-STAT TROPONIN, ED: TROPONIN I, POC: 0.07 ng/mL (ref 0.00–0.08)

## 2017-10-29 LAB — D-DIMER, QUANTITATIVE (NOT AT ARMC): D DIMER QUANT: 0.39 ug{FEU}/mL (ref 0.00–0.50)

## 2017-10-29 MED ORDER — AZITHROMYCIN 250 MG PO TABS: 250.0000 mg | ORAL_TABLET | Freq: Every day | ORAL | 0 refills | Status: DC

## 2017-10-29 MED ORDER — FUROSEMIDE 10 MG/ML IJ SOLN
40.0000 mg | INTRAMUSCULAR | Status: AC
  Administered 2017-10-29: 40 mg via INTRAVENOUS
  Filled 2017-10-29: qty 4

## 2017-10-29 MED ORDER — BENZONATATE 100 MG PO CAPS: 200.0000 mg | ORAL_CAPSULE | Freq: Two times a day (BID) | ORAL | 0 refills | Status: DC | PRN

## 2017-10-29 MED ORDER — POTASSIUM CHLORIDE CRYS ER 20 MEQ PO TBCR
40.0000 meq | EXTENDED_RELEASE_TABLET | Freq: Once | ORAL | Status: AC
  Administered 2017-10-29: 40 meq via ORAL
  Filled 2017-10-29: qty 2

## 2017-10-29 MED ORDER — HYDROCODONE-HOMATROPINE 5-1.5 MG/5ML PO SYRP
5.0000 mL | ORAL_SOLUTION | Freq: Once | ORAL | Status: AC
  Administered 2017-10-29: 5 mL via ORAL
  Filled 2017-10-29: qty 5

## 2017-10-29 NOTE — Discharge Instructions (Addendum)
Return for worsening shortness of breath or fever.  Take your medications as directed.  Please follow-up with your doctor in 2-3 days.

## 2017-10-29 NOTE — ED Notes (Signed)
Pt ambulated with pulse ox, HR84, O2 sat 94% on RA. Pt ambulated without assistance with no complaints of dizziness. EDPA aware.

## 2017-10-29 NOTE — ED Triage Notes (Signed)
Pt reports having cold symptoms since Thursday. Pt reports SHOB, cough, fever R ribcage pain since Saturday. Pt reports hx of CHF. Pt afebrile here.

## 2017-10-29 NOTE — ED Provider Notes (Signed)
[facility] EMERGENCY DEPARTMENT Provider Note   CSN: [id number] Arrival date & time: 10/29/17  0046     History   Chief Complaint Chief Complaint  Patient presents with  . Shortness of Breath    HPI Frank Coffey is a 37 y.o. male.  Patient with past medical history remarkable for CHF, CKD, presents to the emergency department with a chief complaint of shortness of breath.  He reports gradually worsening symptoms over the past 4 days.  He reports some productive/bloody cough.  States that he has had associated upper abdominal/epigastric pain and has had some nausea and vomiting.  He denies any fevers or chills.  He reports worsened shortness of breath with exertion.  He denies any radiating chest pain.  He takes Lasix at home, and has been taking his medications as directed.  He thinks that he has some extra fluid on his legs.  He denies any other associated symptoms.  The history is provided by the patient. No language interpreter was used.    Past Medical History:  Diagnosis Date  . CHF (congestive heart failure) ([place])   . Chronic kidney disease, stage 3, mod decreased GFR (HCC)   . Gout   . 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 Hx of migraine headaches   . Hyperlipidemia   . Hypertension   . 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 Obesity   . OSA (obstructive sleep apnea)     Patient Active Problem List   Diagnosis Date Noted  . Migraine 10/02/2016  . Erectile dysfunction 10/02/2016  . Annual physical exam 02/22/2016  . Onychomycosis 02/22/2016  . Pre-operative cardiovascular examination 07/19/2015  . NICM (nonischemic cardiomyopathy) ([place]) 07/19/2015  . Morbid obesity due to excess calories ([place]) 07/19/2015  . 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 CHF (congestive heart failure) ([place]) 12/19/2012  . Benign hypertension with chronic kidney disease, stage IV ([place]) 12/19/2012  . Gout 12/19/2012    . CKD (chronic kidney disease) stage 4, GFR 15-29 ml/min (HCC) 12/19/2012  . OSA (obstructive sleep apnea) 12/19/2012    Past Surgical History:  Procedure Laterality Date  . CHOLECYSTECTOMY    . RETINAL DETACHMENT REPAIR W/ SCLERAL BUCKLE LE     Left        Home Medications    Prior to Admission medications   Medication Sig Start Date End Date Taking? Authorizing Provider  allopurinol (ZYLOPRIM) 300 MG tablet 400 mg.  06/04/17   [provider]  atorvastatin (LIPITOR) 20 MG tablet Take 1 tablet (20 mg total) by mouth daily. 06/14/17   Elby Beck, FNP  carvedilol (COREG) 25 MG tablet TAKE 1 TABLET BY MOUTH TWICE DAILY WITH A MEAL 05/14/17   Brunetta Jeans, PA-C  EQ FIBER SUPPLEMENT PO Take 1 tablet by mouth daily.     [provider]  fluticasone (FLONASE) 50 MCG/ACT nasal spray Place 2 sprays into both nostrils daily. Patient taking differently: Place 2 sprays into both nostrils daily as needed for allergies.  10/03/16   Brunetta Jeans, PA-C  hydrALAZINE (APRESOLINE) 100 MG tablet Take 1 tablet (100 mg total) by mouth 3 (three) times daily. 05/23/17   Brunetta Jeans, PA-C  meloxicam (MOBIC) 7.5 MG tablet Take 1 tablet (7.5 mg total) by mouth daily. 08/17/17   Tasia Catchings, Amy V, PA-C  sildenafil (VIAGRA) 25 MG tablet Take 1 tablet (25 mg total) by mouth daily as needed for erectile dysfunction.  10/02/16   Brunetta Jeans, PA-C  spironolactone (ALDACTONE) 25 MG tablet Take 1 tablet (25 mg total) by mouth daily. 05/23/17   Brunetta Jeans, PA-C  torsemide (DEMADEX) 20 MG tablet Take 1 tablet (20 mg total) by mouth 2 (two) times daily. Patient taking differently: Take 60 mg by mouth 2 (two) times daily.  04/27/17   Brunetta Jeans, PA-C  UNABLE TO FIND CPAP Use nightly for OSA    [provider]    Family History Family History  Problem Relation Age of Onset  . Hypertension Mother   . Hyperlipidemia Mother   . Heart disease Mother   . Hypertension  Maternal Grandmother   . Stroke Maternal Grandmother   . Heart disease Maternal Grandmother   . Hyperlipidemia Maternal Grandmother   . Stroke Maternal Grandfather   . Hypertension Maternal Grandfather   . Heart disease Maternal Grandfather   . Hyperlipidemia Maternal Grandfather   . Alzheimer's disease Maternal Grandfather   . Cancer - Other Paternal Grandmother   . Healthy Brother        x2  . Healthy Sister        x2  . Healthy Son        x2  . Healthy Daughter        x1  . Diabetes Neg Hx   . Heart attack Neg Hx   . Sudden death Neg Hx     Social History Social History   Tobacco Use  . Smoking status: Never Smoker  . Smokeless tobacco: Never Used  Substance Use Topics  . Alcohol use: Yes    Comment: socially -- once every few months  . Drug use: No     Allergies   Patient has no known allergies.   Review of Systems Review of Systems  All other systems reviewed and are negative.    Physical Exam Updated Vital Signs BP (!) 168/121 (BP Location: Right Wrist)   Pulse 86   Temp 99 F (37.2 C) (Oral)   Resp (!) 24   SpO2 97%   Physical Exam  Constitutional: He is oriented to person, place, and time. He appears well-developed and well-nourished.  HENT:  Head: Normocephalic and atraumatic.  Eyes: Pupils are equal, round, and reactive to light. Conjunctivae and EOM are normal. Right eye exhibits no discharge. Left eye exhibits no discharge. No scleral icterus.  Neck: Normal range of motion. Neck supple. No JVD present.  Cardiovascular: Normal rate, regular rhythm and normal heart sounds. Exam reveals no gallop and no friction rub.  No murmur heard. Pulmonary/Chest: Effort normal and breath sounds normal. No respiratory distress. He has no wheezes. He has no rales. He exhibits no tenderness.  Abdominal: Soft. He exhibits no distension and no mass. There is no tenderness. There is no rebound and no guarding.  Musculoskeletal: Normal range of motion. He  exhibits no edema or tenderness.  Neurological: He is alert and oriented to person, place, and time.  Skin: Skin is warm and dry.  Psychiatric: He has a normal mood and affect. His behavior is normal. Judgment and thought content normal.  Nursing note and vitals reviewed.    ED Treatments / Results  Labs (all labs ordered are listed, but only abnormal results are displayed) Labs Reviewed  CBC WITH DIFFERENTIAL/PLATELET - Abnormal; Notable for the following components:      Result Value   WBC 12.6 (*)    Hemoglobin 11.5 (*)    MCV 70.9 (*)  MCH 20.5 (*)    MCHC 29.0 (*)    RDW 19.7 (*)    Neutro Abs 9.2 (*)    Monocytes Absolute 1.4 (*)    All other components within normal limits  BASIC METABOLIC PANEL - Abnormal; Notable for the following components:   Potassium 3.0 (*)    Glucose, Bld 108 (*)    BUN 29 (*)    Creatinine, Ser 3.68 (*)    Calcium 8.8 (*)    GFR calc non Af Amer 19 (*)    GFR calc Af Amer 22 (*)    All other components within normal limits  BRAIN NATRIURETIC PEPTIDE  D-DIMER, QUANTITATIVE (NOT AT [facility])  I-STAT TROPONIN, ED    EKG EKG Interpretation  Date/Time:  Monday October 29 2017 00:54:00 EDT Ventricular Rate:  89 PR Interval:  174 QRS Duration: 112 QT Interval:  416 QTC Calculation: 506 R Axis:   15 Text Interpretation:  Normal sinus rhythm Possible Left atrial enlargement Left ventricular hypertrophy with repolarization abnormality Prolonged QT Abnormal ECG When compared with ECG of 08/17/2017, No significant change was found Confirmed by Delora Fuel ([id number]) on 10/29/2017 1:00:31 AM   Radiology Dg Chest 2 View  Result Date: 10/29/2017 CLINICAL DATA:  Cold like symptoms since Thursday. EXAM: CHEST - 2 VIEW COMPARISON:  02/03/2017 FINDINGS: Enlarged cardiopericardial silhouette with interstitial edema and bibasilar atelectasis. Uncoiled appearance of the thoracic aorta without definite aneurysm. No pneumothorax, effusion nor acute osseous  abnormality. IMPRESSION: Cardiomegaly with slight uncoiling of the thoracic aorta. Mild interstitial edema and bibasilar atelectasis. Electronically Signed   By: Ashley Royalty M.D.   On: 10/29/2017 01:27    Procedures Procedures (including critical care time)  Medications Ordered in ED Medications - No data to display   Initial Impression / Assessment and Plan / ED Course  I have reviewed the triage vital signs and the nursing notes.  Pertinent labs & imaging results that were available during my care of the patient were reviewed by me and considered in my medical decision making (see chart for details).     Patient with CHF and CKD.  Here with shortness of breath.  He is noted to be tachypneic and hypertensive.  Also has O2 saturation on room air between 90 to 95% during my exam.  His lung sounds are diminished.  Will check chest x-ray.  Concern for CHF exacerbation.  Trop negative.  EKG without any significant change from prior.  CXR shows mild interstitial edema.  Slight uncoiling of thoracic aorta.  Discussed this with Dr. Randal Buba, who recommends dissection study.  CT states can't do CT because of GFR.  Discussed with radiology and Dr. Randal Buba, all in agreement that if d-dimer is normal can be reassured that not dissection.  D-dimer is normal.  Will give some K, as it is 3.0 today.  Patient ambulates maintaining normal O2 sat and HR.  I did offer admission for some diuresis, but patient states that he would be comfortable with going home.  Will give some lasix in the ED and will discharge home with some cough medicine.    He is afebrile and VSS.  O2 sat was 88% when sleeping, but quickly came up to 97% when I woke the patient up.  He has had some cold like symptoms as well.  Will give some cough medicine.  May benefit from a z-pak due to mildly increased WBC and productive cough.  Feel that he is stable for discharge and outpatient follow-up.  Final Clinical Impressions(s) / ED  Diagnoses   Final diagnoses:  Congestive heart failure, unspecified HF chronicity, unspecified heart failure type (HCC)  Cough  Shortness of breath    ED Discharge Orders         Ordered    benzonatate (TESSALON) 100 MG capsule  2 times daily PRN     10/29/17 0548    azithromycin (ZITHROMAX) 250 MG tablet  Daily     10/29/17 0548           Montine Circle, PA-C 10/29/17 0549    Palumbo, April, MD 10/29/17 2979

## 2017-11-14 ENCOUNTER — Observation Stay (HOSPITAL_BASED_OUTPATIENT_CLINIC_OR_DEPARTMENT_OTHER): Payer: Managed Care, Other (non HMO)

## 2017-11-14 ENCOUNTER — Observation Stay (HOSPITAL_COMMUNITY)
Admission: EM | Admit: 2017-11-14 | Discharge: 2017-11-16 | Disposition: A | Discharge status: Home / Self Care | Payer: Managed Care, Other (non HMO) | Attending: Family Medicine | Admitting: Family Medicine

## 2017-11-14 ENCOUNTER — Emergency Department (HOSPITAL_COMMUNITY): Payer: Managed Care, Other (non HMO)

## 2017-11-14 ENCOUNTER — Observation Stay (HOSPITAL_COMMUNITY): Payer: Managed Care, Other (non HMO)

## 2017-11-14 ENCOUNTER — Other Ambulatory Visit: Payer: Self-pay

## 2017-11-14 ENCOUNTER — Encounter (HOSPITAL_COMMUNITY): Payer: Self-pay

## 2017-11-14 DIAGNOSIS — I1311 Hypertensive heart and chronic kidney disease without heart failure, with stage 5 chronic kidney disease, or end stage renal disease: Secondary | ICD-10-CM

## 2017-11-14 DIAGNOSIS — N184 Chronic kidney disease, stage 4 (severe): Secondary | ICD-10-CM

## 2017-11-14 DIAGNOSIS — I130 Hypertensive heart and chronic kidney disease with heart failure and stage 1 through stage 4 chronic kidney disease, or unspecified chronic kidney disease: Secondary | ICD-10-CM | POA: Diagnosis not present

## 2017-11-14 DIAGNOSIS — Z79899 Other long term (current) drug therapy: Secondary | ICD-10-CM | POA: Diagnosis not present

## 2017-11-14 DIAGNOSIS — N183 Chronic kidney disease, stage 3 (moderate): Secondary | ICD-10-CM | POA: Diagnosis not present

## 2017-11-14 DIAGNOSIS — D631 Anemia in chronic kidney disease: Secondary | ICD-10-CM | POA: Diagnosis not present

## 2017-11-14 DIAGNOSIS — I509 Heart failure, unspecified: Secondary | ICD-10-CM | POA: Insufficient documentation

## 2017-11-14 DIAGNOSIS — I160 Hypertensive urgency: Secondary | ICD-10-CM | POA: Diagnosis present

## 2017-11-14 DIAGNOSIS — Z8673 Personal history of transient ischemic attack (TIA), and cerebral infarction without residual deficits: Secondary | ICD-10-CM | POA: Insufficient documentation

## 2017-11-14 DIAGNOSIS — I441 Atrioventricular block, second degree: Secondary | ICD-10-CM

## 2017-11-14 DIAGNOSIS — Z992 Dependence on renal dialysis: Secondary | ICD-10-CM | POA: Diagnosis present

## 2017-11-14 DIAGNOSIS — M109 Gout, unspecified: Secondary | ICD-10-CM | POA: Diagnosis present

## 2017-11-14 DIAGNOSIS — G459 Transient cerebral ischemic attack, unspecified: Secondary | ICD-10-CM | POA: Diagnosis not present

## 2017-11-14 DIAGNOSIS — G4733 Obstructive sleep apnea (adult) (pediatric): Secondary | ICD-10-CM | POA: Diagnosis not present

## 2017-11-14 DIAGNOSIS — E785 Hyperlipidemia, unspecified: Secondary | ICD-10-CM | POA: Diagnosis not present

## 2017-11-14 DIAGNOSIS — R200 Anesthesia of skin: Secondary | ICD-10-CM | POA: Diagnosis present

## 2017-11-14 DIAGNOSIS — N186 End stage renal disease: Secondary | ICD-10-CM | POA: Diagnosis present

## 2017-11-14 DIAGNOSIS — I428 Other cardiomyopathies: Secondary | ICD-10-CM | POA: Diagnosis not present

## 2017-11-14 DIAGNOSIS — I10 Essential (primary) hypertension: Secondary | ICD-10-CM

## 2017-11-14 LAB — CBC
HCT: 44.7 % (ref 39.0–52.0)
HEMATOCRIT: 41.2 % (ref 39.0–52.0)
HEMOGLOBIN: 11.8 g/dL — AB (ref 13.0–17.0)
Hemoglobin: 12.9 g/dL — ABNORMAL LOW (ref 13.0–17.0)
MCH: 21.1 pg — AB (ref 26.0–34.0)
MCH: 21 pg — AB (ref 26.0–34.0)
MCHC: 28.9 g/dL — ABNORMAL LOW (ref 30.0–36.0)
MCHC: 28.6 g/dL — AB (ref 30.0–36.0)
MCV: 73 fL — AB (ref 80.0–100.0)
MCV: 73.2 fL — AB (ref 80.0–100.0)
Platelets: 341 10*3/uL (ref 150–400)
Platelets: 321 10*3/uL (ref 150–400)
RBC: 6.12 MIL/uL — ABNORMAL HIGH (ref 4.22–5.81)
RBC: 5.63 MIL/uL (ref 4.22–5.81)
RDW: 19.9 % — ABNORMAL HIGH (ref 11.5–15.5)
RDW: 19.9 % — ABNORMAL HIGH (ref 11.5–15.5)
WBC: 11.8 10*3/uL — AB (ref 4.0–10.5)
WBC: 9.7 10*3/uL (ref 4.0–10.5)
nRBC: 0 % (ref 0.0–0.2)
nRBC: 0 % (ref 0.0–0.2)

## 2017-11-14 LAB — COMPREHENSIVE METABOLIC PANEL
ALK PHOS: 83 U/L (ref 38–126)
ALT: 22 U/L (ref 0–44)
AST: 29 U/L (ref 15–41)
Albumin: 3.7 g/dL (ref 3.5–5.0)
Anion gap: 12 (ref 5–15)
BILIRUBIN TOTAL: 0.7 mg/dL (ref 0.3–1.2)
BUN: 34 mg/dL — AB (ref 6–20)
CALCIUM: 9.4 mg/dL (ref 8.9–10.3)
CHLORIDE: 98 mmol/L (ref 98–111)
CO2: 28 mmol/L (ref 22–32)
CREATININE: 3.67 mg/dL — AB (ref 0.61–1.24)
GFR, EST AFRICAN AMERICAN: 22 mL/min — AB (ref >60)
GFR, EST NON AFRICAN AMERICAN: 19 mL/min — AB (ref >60)
Glucose, Bld: 115 mg/dL — ABNORMAL HIGH (ref 70–99)
Potassium: 2.9 mmol/L — ABNORMAL LOW (ref 3.5–5.1)
Sodium: 138 mmol/L (ref 135–145)
TOTAL PROTEIN: 7.8 g/dL (ref 6.5–8.1)

## 2017-11-14 LAB — DIFFERENTIAL
Abs Immature Granulocytes: 0.04 10*3/uL (ref 0.00–0.07)
BASOS PCT: 0 %
Basophils Absolute: 0.1 10*3/uL (ref 0.0–0.1)
Eosinophils Absolute: 0.3 10*3/uL (ref 0.0–0.5)
Eosinophils Relative: 3 %
Immature Granulocytes: 0 %
LYMPHS ABS: 2.5 10*3/uL (ref 0.7–4.0)
Lymphocytes Relative: 21 %
MONO ABS: 1.3 10*3/uL — AB (ref 0.1–1.0)
MONOS PCT: 11 %
NEUTROS ABS: 7.6 10*3/uL (ref 1.7–7.7)
NEUTROS PCT: 65 %

## 2017-11-14 LAB — PROTIME-INR
INR: 1.03
PROTHROMBIN TIME: 13.4 s (ref 11.4–15.2)

## 2017-11-14 LAB — I-STAT CHEM 8, ED
BUN: 38 mg/dL — AB (ref 6–20)
CALCIUM ION: 1.13 mmol/L — AB (ref 1.15–1.40)
CREATININE: 4.1 mg/dL — AB (ref 0.61–1.24)
Chloride: 99 mmol/L (ref 98–111)
GLUCOSE: 110 mg/dL — AB (ref 70–99)
HCT: 45 % (ref 39.0–52.0)
HEMOGLOBIN: 15.3 g/dL (ref 13.0–17.0)
Potassium: 3 mmol/L — ABNORMAL LOW (ref 3.5–5.1)
Sodium: 140 mmol/L (ref 135–145)
TCO2: 30 mmol/L (ref 22–32)

## 2017-11-14 LAB — I-STAT TROPONIN, ED: TROPONIN I, POC: 0.06 ng/mL (ref 0.00–0.08)

## 2017-11-14 LAB — LIPID PANEL
CHOL/HDL RATIO: 5.7 ratio
CHOLESTEROL: 177 mg/dL (ref 0–200)
HDL: 31 mg/dL — AB (ref >40)
LDL Cholesterol: 113 mg/dL — ABNORMAL HIGH (ref 0–99)
TRIGLYCERIDES: 167 mg/dL — AB (ref <150)
VLDL: 33 mg/dL (ref 0–40)

## 2017-11-14 LAB — ECHOCARDIOGRAM COMPLETE

## 2017-11-14 LAB — CREATININE, SERUM
CREATININE: 3.52 mg/dL — AB (ref 0.61–1.24)
GFR calc Af Amer: 24 mL/min — ABNORMAL LOW (ref >60)
GFR calc non Af Amer: 20 mL/min — ABNORMAL LOW (ref >60)

## 2017-11-14 LAB — HEMOGLOBIN A1C
HEMOGLOBIN A1C: 5.8 % — AB (ref 4.8–5.6)
MEAN PLASMA GLUCOSE: 119.76 mg/dL

## 2017-11-14 LAB — APTT: aPTT: 31 seconds (ref 24–36)

## 2017-11-14 LAB — HIV ANTIBODY (ROUTINE TESTING W REFLEX): HIV Screen 4th Generation wRfx: NONREACTIVE

## 2017-11-14 LAB — CBG MONITORING, ED: Glucose-Capillary: 107 mg/dL — ABNORMAL HIGH (ref 70–99)

## 2017-11-14 MED ORDER — HYDRALAZINE HCL 50 MG PO TABS
100.0000 mg | ORAL_TABLET | Freq: Three times a day (TID) | ORAL | Status: DC
  Administered 2017-11-14 – 2017-11-16 (×5): 100 mg via ORAL
  Filled 2017-11-14 (×8): qty 2

## 2017-11-14 MED ORDER — ASPIRIN 325 MG PO TABS
325.0000 mg | ORAL_TABLET | Freq: Every day | ORAL | Status: DC
  Administered 2017-11-14 – 2017-11-16 (×3): 325 mg via ORAL
  Filled 2017-11-14 (×3): qty 1

## 2017-11-14 MED ORDER — LORAZEPAM 2 MG/ML IJ SOLN
1.0000 mg | Freq: Once | INTRAMUSCULAR | Status: AC
  Administered 2017-11-14: 1 mg via INTRAVENOUS
  Filled 2017-11-14: qty 1

## 2017-11-14 MED ORDER — STROKE: EARLY STAGES OF RECOVERY BOOK
Freq: Once | Status: DC
  Filled 2017-11-14: qty 1

## 2017-11-14 MED ORDER — ALLOPURINOL 100 MG PO TABS
400.0000 mg | ORAL_TABLET | Freq: Every day | ORAL | Status: DC
  Administered 2017-11-15 – 2017-11-16 (×2): 400 mg via ORAL
  Filled 2017-11-14 (×2): qty 4
  Filled 2017-11-14: qty 1

## 2017-11-14 MED ORDER — ACETAMINOPHEN 325 MG PO TABS: 650.0000 mg | ORAL_TABLET | ORAL | Status: DC | PRN

## 2017-11-14 MED ORDER — ACETAMINOPHEN 650 MG RE SUPP: 650.0000 mg | RECTAL | Status: DC | PRN

## 2017-11-14 MED ORDER — POTASSIUM CHLORIDE CRYS ER 20 MEQ PO TBCR
40.0000 meq | EXTENDED_RELEASE_TABLET | Freq: Once | ORAL | Status: AC
  Administered 2017-11-14: 40 meq via ORAL
  Filled 2017-11-14: qty 2

## 2017-11-14 MED ORDER — FLUTICASONE PROPIONATE 50 MCG/ACT NA SUSP: 2.0000 | Freq: Every day | NASAL | Status: DC | PRN

## 2017-11-14 MED ORDER — ALLOPURINOL 300 MG PO TABS: 300.0000 mg | ORAL_TABLET | Freq: Every day | ORAL | Status: DC

## 2017-11-14 MED ORDER — SENNOSIDES-DOCUSATE SODIUM 8.6-50 MG PO TABS: 1.0000 | ORAL_TABLET | Freq: Every evening | ORAL | Status: DC | PRN

## 2017-11-14 MED ORDER — ALLOPURINOL 100 MG PO TABS: 100.0000 mg | ORAL_TABLET | Freq: Every day | ORAL | Status: DC

## 2017-11-14 MED ORDER — ASPIRIN 300 MG RE SUPP: 300.0000 mg | Freq: Every day | RECTAL | Status: DC

## 2017-11-14 MED ORDER — CARVEDILOL 12.5 MG PO TABS
25.0000 mg | ORAL_TABLET | Freq: Two times a day (BID) | ORAL | Status: DC
  Administered 2017-11-14: 25 mg via ORAL
  Filled 2017-11-14: qty 2

## 2017-11-14 MED ORDER — ENOXAPARIN SODIUM 40 MG/0.4ML ~~LOC~~ SOLN
40.0000 mg | Freq: Every day | SUBCUTANEOUS | Status: DC
  Administered 2017-11-14 – 2017-11-16 (×3): 40 mg via SUBCUTANEOUS
  Filled 2017-11-14 (×3): qty 0.4

## 2017-11-14 MED ORDER — ATORVASTATIN CALCIUM 10 MG PO TABS
20.0000 mg | ORAL_TABLET | Freq: Every day | ORAL | Status: DC
  Administered 2017-11-14 – 2017-11-16 (×3): 20 mg via ORAL
  Filled 2017-11-14 (×2): qty 2
  Filled 2017-11-14: qty 1

## 2017-11-14 MED ORDER — INFLUENZA VAC SPLIT QUAD 0.5 ML IM SUSY: 0.5000 mL | PREFILLED_SYRINGE | INTRAMUSCULAR | Status: DC | PRN

## 2017-11-14 MED ORDER — HYDRALAZINE HCL 20 MG/ML IJ SOLN
10.0000 mg | INTRAMUSCULAR | Status: DC | PRN
  Administered 2017-11-15 – 2017-11-16 (×2): 10 mg via INTRAVENOUS
  Filled 2017-11-14 (×2): qty 1

## 2017-11-14 MED ORDER — ACETAMINOPHEN 160 MG/5ML PO SOLN: 650.0000 mg | ORAL | Status: DC | PRN

## 2017-11-14 NOTE — Progress Notes (Signed)
MRI attempted x2--second time pt snoring and when we put the coil over his face he voiced he could not tolerate it.

## 2017-11-14 NOTE — Progress Notes (Addendum)
PROGRESS NOTE  Frank Coffey STM:[id number] DOB: [birth year] DOA: 11/14/2017 PCP: Elby Beck, FNP  Brief Narrative: 37 year old man PMH nonischemic cardiomyopathy, essential hypertension, CKD stage IV, reported dizziness, followed by vomiting, followed by left facial numbness and difficulty speaking.  Off blood pressure medications last 3 days.  Not using CPAP for 6 months secondary to broken machine.  Code stroke was called but canceled by neurology.  Noted to have markedly elevated blood pressure on presentation which improved without intervention.  Assessment/Plan Dysphagia, left facial numbness, possible TIA versus acute lacunar infarction.  CT head no acute findings. --Could not tolerate MRI even with Ativan.  Unable to perform CTA secondary to high creatinine. --Continue ASA, statin, further recommendations per neurology  Hypertensive urgency versus emergency, off medications for 3 days.  Blood pressure improved initially without intervention. --Per neurology permissive hypertension 24 hours, treat if SBP greater than 220 or DBP greater than 110  Nonischemic cardiomyopathy --Appears stable.  CKD stage III --Appears to be at baseline.  Hyperlipidemia --Continue statin.  Anemia of CKD --Stable.  Morbid obesity. There is no height or weight on file to calculate BMI.  --  OSA --CPAP. CM referral for new machine.  DVT prophylaxis: enoxaparin Code Status: Full Family Communication: none Disposition Plan: home    Murray Hodgkins, MD  [facility] Direct contact: [phone] --Via amion app OR  --[email or web address]; password [email or web address]  7PM-7AM contact night coverage as above 11/14/2017, 11:30 AM  LOS: 0 days   Consultants:  Neurology   Procedures:    Antimicrobials:    Interval history/Subjective: Ms. too nervous to tolerate MRI.  Denies any facial numbness, difficulty speaking or swallowing, no peripheral neurologic symptoms  Objective: Vitals:    Vitals:   11/14/17 1038 11/14/17 1100  BP: (!) 159/109 (!) 166/100  Pulse: 68 81  Resp: 13 (!) 22  Temp:    SpO2: 94% 98%    Exam:  Constitutional:  . Appears calm and comfortable Eyes:  . pupils and irises appear normal ENMT:  . grossly normal hearing  Respiratory:  . CTA bilaterally, no w/r/r.  . Respiratory effort normal.  Cardiovascular:  . RRR, no m/r/g . No LE extremity edema   Musculoskeletal:  . RUE, LUE, RLE, LLE   o strength and tone normal, no atrophy, no abnormal movements Neurologic:  . Sensation all 4 extremities intact Psychiatric:  . Mental status o Mood, affect appropriate  I have personally reviewed the following:   Data: . Creatinine appears to be at baseline, 3.5 to, potassium 3.0, LFTs unremarkable, troponin negative, LDL 113.  Hemoglobin 11.8, stable.  Hemoglobin A1c 5.8. [name] Chest x-ray showed mild interstitial edema . EKG independently reviewed sinus rhythm, LVH with repolarization abnormality, prolonged QT, compared to previous study 10/29/2017, QT has shortened.  Scheduled Meds: .  stroke: mapping our early stages of recovery book   Does not apply Once  . allopurinol  400 mg Oral Daily  . aspirin  300 mg Rectal Daily   Or  . aspirin  325 mg Oral Daily  . atorvastatin  20 mg Oral Daily  . carvedilol  25 mg Oral BID WC  . enoxaparin (LOVENOX) injection  40 mg Subcutaneous Daily  . hydrALAZINE  100 mg Oral Q8H   Continuous Infusions:  Principal Problem:   TIA (transient ischemic attack) Active Problems:   Gout   CKD (chronic kidney disease) stage 4, GFR 15-29 ml/min (HCC)   OSA (obstructive sleep apnea)   NICM (nonischemic cardiomyopathy) ([place])  Morbid obesity due to excess calories ([place])   Hypertensive urgency   LOS: 0 days     Time 12:00-1235

## 2017-11-14 NOTE — ED Notes (Signed)
Went over to MRI per request of patient to receive medication for MRI- pt still in MRI waiting on test to began.

## 2017-11-14 NOTE — Consult Note (Signed)
Referring Physician: Dr. Stark Jock    Chief Complaint: Acute onset of dysphasia with left facial numbness  HPI: Frank Coffey is an 37 y.o. male presenting to the ED by car for evaluation of acute onset dysphasia with left facial numbness. LKN was 2200 when he went to bed. On awakening at 2300 the patient noticed left facial numbness. His son felt that the patient also had abnormal speech, so he was driven to the ED. He has no prior history of stroke, but has multiple risk factors. He is not on a blood thinner or an antiplatelet agent.   LSN: 2200 tPA Given: No: Rapidly resolving symptoms. No speech deficit or weakness on exam.   Past Medical History:  Diagnosis Date  . CHF (congestive heart failure) ([place])   . Chronic kidney disease, stage 3, mod decreased GFR (HCC)   . Gout   . 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 Hx of migraine headaches   . Hyperlipidemia   . Hypertension   . 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 Obesity   . OSA (obstructive sleep apnea)     Past Surgical History:  Procedure Laterality Date  . CHOLECYSTECTOMY    . RETINAL DETACHMENT REPAIR W/ SCLERAL BUCKLE LE     Left    Family History  Problem Relation Age of Onset  . Hypertension Mother   . Hyperlipidemia Mother   . Heart disease Mother   . Hypertension Maternal Grandmother   . Stroke Maternal Grandmother   . Heart disease Maternal Grandmother   . Hyperlipidemia Maternal Grandmother   . Stroke Maternal Grandfather   . Hypertension Maternal Grandfather   . Heart disease Maternal Grandfather   . Hyperlipidemia Maternal Grandfather   . Alzheimer's disease Maternal Grandfather   . Cancer - Other Paternal Grandmother   . Healthy Brother        x2  . Healthy Sister        x2  . Healthy Son        x2  . Healthy Daughter        x1  . Diabetes Neg Hx   . Heart attack Neg Hx   . Sudden death Neg Hx    Social History:  reports that he has never smoked. He  has never used smokeless tobacco. He reports that he drinks alcohol. He reports that he does not use drugs.  Allergies: No Known Allergies  Home Medications:    ROS: No headache, neck pain, CP, new vision loss, limb weakness, limb numbness or incoordination. Other ROS as per HPI.   Physical Examination: Blood pressure (!) 156/122, pulse 83, temperature 98.7 F (37.1 C), temperature source Oral, resp. rate 14, SpO2 98 %.  Gen: Morbidly obese HEENT: [place]/AT Lungs: Intermittent rapid shallow respirations while supine Ext: Warm and well perfused  Neurologic Examination: Mental Status: Alert, fully oriented, thought content appropriate.  Speech fluent with intact naming, comprehension and repetition. Able to follow a directional 2 step command without difficulty. Cranial Nerves: II:  Visual fields intact OD. 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 Right pupil 3 >> 2 mm, Left pupil 2 mm and unreactive. III,IV, VI: No ptosis. Right EOMI. Left eye movement intermittently lags right during pursuits. No nystagmus.  V,VII: Smile symmetric without facial droop. Cheek puff normal. Facial temp sensation equal bilaterally VIII: hearing intact to conversation IX,X: No hoarseness or hypophonia XI: Head at midline  XII: midline tongue extension  Motor: Right : Upper extremity   5/5    Left:     Upper extremity   5/5  Lower extremity   5/5     Lower extremity   5/5 No pronator drift Normal tone throughout; no atrophy noted Sensory: Pinprick and light touch intact throughout, bilaterally Deep Tendon Reflexes:  2+ brachioradialis bilaterally.  1+ biceps bilaterally 2+ patellae bilaterally 1+ achilles on right, 0 on left Plantars: Right: downgoing   Left: downgoing Cerebellar: No ataxia with FNF or H-S bilaterally Gait: Deferred  Results for orders placed or performed during the hospital encounter of 11/14/17 (from the past 48 hour(s))  CBG  monitoring, ED     Status: Abnormal   Collection Time: 11/14/17  1:13 AM  Result Value Ref Range   Glucose-Capillary 107 (H) 70 - 99 mg/dL  Protime-INR     Status: None   Collection Time: 11/14/17  1:15 AM  Result Value Ref Range   Prothrombin Time 13.4 11.4 - 15.2 seconds   INR 1.03     Comment: Performed at [facility], [address] [place], [place] [address]  APTT     Status: None   Collection Time: 11/14/17  1:15 AM  Result Value Ref Range   aPTT 31 24 - 36 seconds    Comment: Performed at [facility] [address] [place], [place] [address]  CBC     Status: Abnormal   Collection Time: 11/14/17  1:15 AM  Result Value Ref Range   WBC 11.8 (H) 4.0 - 10.5 K/uL   RBC 6.12 (H) 4.22 - 5.81 MIL/uL   Hemoglobin 12.9 (L) 13.0 - 17.0 g/dL   HCT 44.7 39.0 - 52.0 %   MCV 73.0 (L) 80.0 - 100.0 fL   MCH 21.1 (L) 26.0 - 34.0 pg   MCHC 28.9 (L) 30.0 - 36.0 g/dL   RDW 19.9 (H) 11.5 - 15.5 %   Platelets 341 150 - 400 K/uL   nRBC 0.0 0.0 - 0.2 %    Comment: Performed at [facility] [address], [place], [place] [address]  Differential     Status: Abnormal   Collection Time: 11/14/17  1:15 AM  Result Value Ref Range   Neutrophils Relative % 65 %   Neutro Abs 7.6 1.7 - 7.7 K/uL   Lymphocytes Relative 21 %   Lymphs Abs 2.5 0.7 - 4.0 K/uL   Monocytes Relative 11 %   Monocytes Absolute 1.3 (H) 0.1 - 1.0 K/uL   Eosinophils Relative 3 %   Eosinophils Absolute 0.3 0.0 - 0.5 K/uL   Basophils Relative 0 %   Basophils Absolute 0.1 0.0 - 0.1 K/uL   Immature Granulocytes 0 %   Abs Immature Granulocytes 0.04 0.00 - 0.07 K/uL    Comment: Performed at [facility] [address], [place], [place] [address]  Comprehensive metabolic panel     Status: Abnormal   Collection Time: 11/14/17  1:15 AM  Result Value Ref Range   Sodium 138 135 - 145 mmol/L   Potassium 2.9 (L) 3.5 - 5.1 mmol/L   Chloride 98 98 - 111 mmol/L   CO2 28 22 - 32 mmol/L   Glucose, Bld 115 (H)  70 - 99 mg/dL   BUN 34 (H) 6 - 20 mg/dL   Creatinine, Ser 3.67 (H) 0.61 - 1.24 mg/dL   Calcium 9.4 8.9 - 10.3 mg/dL   Total Protein 7.8  6.5 - 8.1 g/dL   Albumin 3.7 3.5 - 5.0 g/dL   AST 29 15 - 41 U/L   ALT 22 0 - 44 U/L   Alkaline Phosphatase 83 38 - 126 U/L   Total Bilirubin 0.7 0.3 - 1.2 mg/dL   GFR calc non Af Amer 19 (L) >60 mL/min   GFR calc Af Amer 22 (L) >60 mL/min    Comment: (NOTE) The eGFR has been calculated using the CKD EPI equation. This calculation has not been validated in all clinical situations. eGFR's persistently <60 mL/min signify possible Chronic Kidney Disease.    Anion gap 12 5 - 15    Comment: Performed at [facility] [address], [place], [place] [address]  I-stat troponin, ED     Status: None   Collection Time: 11/14/17  1:19 AM  Result Value Ref Range   Troponin i, poc 0.06 0.00 - 0.08 ng/mL   Comment 3            Comment: Due to the release kinetics of cTnI, a negative result within the first hours of the onset of symptoms does not rule out myocardial infarction with certainty. If myocardial infarction is still suspected, repeat the test at appropriate intervals.   I-Stat Chem 8, ED     Status: Abnormal   Collection Time: 11/14/17  1:21 AM  Result Value Ref Range   Sodium 140 135 - 145 mmol/L   Potassium 3.0 (L) 3.5 - 5.1 mmol/L   Chloride 99 98 - 111 mmol/L   BUN 38 (H) 6 - 20 mg/dL   Creatinine, Ser 4.10 (H) 0.61 - 1.24 mg/dL   Glucose, Bld 110 (H) 70 - 99 mg/dL   Calcium, Ion 1.13 (L) 1.15 - 1.40 mmol/L   TCO2 30 22 - 32 mmol/L   Hemoglobin 15.3 13.0 - 17.0 g/dL   HCT 45.0 39.0 - 52.0 %   Ct Head Code Stroke Wo Contrast  Result Date: 11/14/2017 CLINICAL DATA:  Code stroke. Initial evaluation for acute difficulty speaking, left-sided facial droop. EXAM: CT HEAD WITHOUT CONTRAST TECHNIQUE: Contiguous axial images were obtained from the base of the skull through the vertex without intravenous contrast. COMPARISON:  Prior CT  from 02/03/2017. FINDINGS: Brain: Cerebral volume within normal limits for patient age. No evidence for acute intracranial hemorrhage. No findings to suggest acute large vessel territory infarct. No mass lesion, midline shift, or mass effect. Ventricles are normal in size without evidence for hydrocephalus. No extra-axial fluid collection identified. Vascular: No hyperdense vessel identified. Skull: Scalp soft tissues demonstrate no acute abnormality. Calvarium intact. Sinuses/Orbits: Globes and orbital soft tissues demonstrate no acute abnormality. Patient status post scleral banding on the left. Visualized paranasal sinuses are clear. No mastoid effusion. ASPECTS [facility] Stroke Program Early CT Score) - Ganglionic level infarction (caudate, lentiform nuclei, internal capsule, insula, M1-M3 cortex): 7 - Supraganglionic infarction (M4-M6 cortex): 3 Total score (0-10 with 10 being normal): 10 IMPRESSION: 1. Negative head CT.  No acute intracranial abnormality identified. 2. ASPECTS is 10. These results were communicated to [place] at 1:30 amon 10/30/2019by text page via the [facility] messaging system. Electronically Signed   By: Jeannine Boga M.D.   On: 11/14/2017 01:32    Assessment: 37 y.o. male presenting with acute onset of dysphasia and left facial numbness 1. Symptomatically rapidly resolving. Neurological exam nonfocal with NIHSS of 1 for pre-existing left monocular vision loss. Most likely components of the DDx are acute lacunar infarction versus MCA territory TIA  2. CT head with no acute findings 3. High Cr. Unable to perform CTA 4. Stroke Risk Factors - Morbid obesity, HTN, CHF, HLD and OSA  Plan: 1. HgbA1c, fasting lipid panel 2. MRI, MRA of the brain without contrast 3. PT consult, OT consult, Speech consult 4. Echocardiogram 5. Carotid dopplers 6. Prophylactic therapy- Load ASA 650 mg then start scheduled ASA at 81 mg po qd 7. Risk factor modification 8. Telemetry monitoring 9.  Frequent neuro checks 10. If indicated based on results of stroke work up, increase atorvastatin to 40 mg po qd.  11. Permissive HTN x 24 hours. Treat if SBP > 220 or DBP > 110   '@Electronically'$  signed: Dr. Kerney Elbe  11/14/2017, 2:24 AM

## 2017-11-14 NOTE — Progress Notes (Signed)
*  PRELIMINARY RESULTS* Vascular Ultrasound Carotid Duplex (Doppler) has been completed.  Findings suggest 1-39% internal carotid artery stenosis bilaterally. Vertebral arteries are patent with antegrade flow.  11/14/2017 2:09 PM Maudry Mayhew, MHA, RVT, RDCS, RDMS

## 2017-11-14 NOTE — Progress Notes (Signed)
  Echocardiogram 2D Echocardiogram has been performed.  Darlina Sicilian M 11/14/2017, 11:21 AM

## 2017-11-14 NOTE — ED Notes (Signed)
Samuella Cota, MD Paged to informed even after ativan administration pt unable to have MRI, pt was sleeping prior to MRI and immediatly wakes up when in MRI room refuses to have MRI due to anxiety.

## 2017-11-14 NOTE — H&P (Signed)
History and Physical    Frank Coffey TZG:[id number] DOB: Mar 07, 1980 DOA: 11/14/2017  PCP: Elby Beck, FNP  Patient coming from: Home.  Chief Complaint: Left facial numbness.  HPI: Frank Coffey is a 37 y.o. male with history of nonischemic cardiomyopathy last EF measured in 2016 was 30%, hypertension, chronic kidney disease stage IV follows with nephrologist, sleep apnea had gone to bed around 10 PM and woke up at around 11 PM since patient was feeling dehydrated and went to drink some water at his refrigerator when he started feeling dizzy.  After drinking water he went to the bathroom since he was throwing up.  At that point he noticed that his left face was getting numb and also had some difficulty speaking.  Patient came to the ER.  Patient states he has not been taking his blood pressure medication for last to 3 days.  And also has not been using his CPAP since it broke 6 months ago.  ED Course: In the ER patient was called a code stroke which was canceled after neurology evaluation.  Patient symptoms improved.  CT head was unremarkable.  Patient is noticed to have markedly elevated blood pressure with systolic blood pressure more than 200 at admission which improved without any intervention.  On exam patient is able to move all extremities no facial asymmetry.  Patient admitted for further TIA work-up and control of blood pressure.  Review of Systems: As per HPI, rest all negative.   Past Medical History:  Diagnosis Date  . CHF (congestive heart failure) ([place])   . Chronic kidney disease, stage 3, mod decreased GFR (HCC)   . Gout   . 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 Hx of migraine headaches   . Hyperlipidemia   . Hypertension   . Hypertensive heart disease with congestive heart failure and stage 3 kidney disease ([place])   . Obesity   . OSA (obstructive sleep apnea)     Past Surgical History:  Procedure Laterality Date  . CHOLECYSTECTOMY    . RETINAL DETACHMENT REPAIR W/  SCLERAL BUCKLE LE     Left     reports that he has never smoked. He has never used smokeless tobacco. He reports that he drinks alcohol. He reports that he does not use drugs.  No Known Allergies  Family History  Problem Relation Age of Onset  . Hypertension Mother   . Hyperlipidemia Mother   . Heart disease Mother   . Hypertension Maternal Grandmother   . Stroke Maternal Grandmother   . Heart disease Maternal Grandmother   . Hyperlipidemia Maternal Grandmother   . Stroke Maternal Grandfather   . Hypertension Maternal Grandfather   . Heart disease Maternal Grandfather   . Hyperlipidemia Maternal Grandfather   . Alzheimer's disease Maternal Grandfather   . Cancer - Other Paternal Grandmother   . Healthy Brother        x2  . Healthy Sister        x2  . Healthy Son        x2  . Healthy Daughter        x1  . Diabetes Neg Hx   . Heart attack Neg Hx   . Sudden death Neg Hx     Prior to Admission medications   Medication Sig Start Date End Date Taking? Authorizing Provider  allopurinol (ZYLOPRIM) 100 MG tablet Take 100 mg by mouth daily.   Yes [provider]  allopurinol (ZYLOPRIM) 300 MG tablet Take 300  mg by mouth daily.  06/04/17  Yes [provider]  atorvastatin (LIPITOR) 20 MG tablet Take 1 tablet (20 mg total) by mouth daily. 06/14/17  Yes Elby Beck, FNP  carvedilol (COREG) 25 MG tablet TAKE 1 TABLET BY MOUTH TWICE DAILY WITH A MEAL Patient taking differently: Take 25 mg by mouth 2 (two) times daily with a meal.  05/14/17  Yes Brunetta Jeans, PA-C  EQ FIBER SUPPLEMENT PO Take 2 tablets by mouth daily.    Yes [provider]  fluticasone (FLONASE) 50 MCG/ACT nasal spray Place 2 sprays into both nostrils daily. Patient taking differently: Place 2 sprays into both nostrils daily as needed for allergies.  10/03/16  Yes Brunetta Jeans, PA-C  hydrALAZINE (APRESOLINE) 100 MG tablet Take 1 tablet (100 mg total) by mouth 3 (three) times  daily. 05/23/17  Yes Brunetta Jeans, PA-C  sildenafil (VIAGRA) 25 MG tablet Take 1 tablet (25 mg total) by mouth daily as needed for erectile dysfunction. 10/02/16  Yes Brunetta Jeans, PA-C  spironolactone (ALDACTONE) 25 MG tablet Take 1 tablet (25 mg total) by mouth daily. 05/23/17  Yes Brunetta Jeans, PA-C  torsemide (DEMADEX) 20 MG tablet Take 1 tablet (20 mg total) by mouth 2 (two) times daily. Patient taking differently: Take 40 mg by mouth 2 (two) times daily.  04/27/17  Yes Brunetta Jeans, PA-C  azithromycin (ZITHROMAX) 250 MG tablet Take 1 tablet (250 mg total) by mouth daily. Take first 2 tablets together, then 1 every day until finished. Patient not taking: Reported on 11/14/2017 10/29/17   Montine Circle, PA-C  benzonatate (TESSALON) 100 MG capsule Take 2 capsules (200 mg total) by mouth 2 (two) times daily as needed for cough. Patient not taking: Reported on 11/14/2017 10/29/17   Montine Circle, PA-C  meloxicam (MOBIC) 7.5 MG tablet Take 1 tablet (7.5 mg total) by mouth daily. Patient not taking: Reported on 11/14/2017 08/17/17   Arturo Morton    Physical Exam: Vitals:   11/14/17 0146 11/14/17 0147 11/14/17 0400 11/14/17 0415  BP: (!) 181/110 (!) 156/122 (!) 162/110 (!) 166/97  Pulse:  83 (!) 58 66  Resp:  14 (!) 25 20  Temp:  98.7 F (37.1 C)    TempSrc:  Oral    SpO2:  98% (!) 84% 97%      Constitutional: Moderately built and nourished. Vitals:   11/14/17 0146 11/14/17 0147 11/14/17 0400 11/14/17 0415  BP: (!) 181/110 (!) 156/122 (!) 162/110 (!) 166/97  Pulse:  83 (!) 58 66  Resp:  14 (!) 25 20  Temp:  98.7 F (37.1 C)    TempSrc:  Oral    SpO2:  98% (!) 84% 97%   Eyes: Anicteric no pallor. ENMT: No discharge from the ears eyes nose or mouth. Neck: No mass felt.  No neck rigidity.  No JVD appreciated. Respiratory: No rhonchi or crepitations. Cardiovascular: S1-S2 heard no murmurs appreciated. Abdomen: Soft nontender bowel sounds  present. Musculoskeletal: No edema.  No joint effusion. Skin: No rash. Neurologic: Alert awake oriented to time place and person.  Moves all extremities 5 x 5.  No facial asymmetry tongue is midline.  Pupils are equal and reacting to light. Psychiatric: Appears normal per normal affect.   Labs on Admission: I have personally reviewed following labs and imaging studies  CBC: Recent Labs  Lab 11/14/17 0115 11/14/17 0121  WBC 11.8*  --   NEUTROABS 7.6  --   HGB 12.9*  15.3  HCT 44.7 45.0  MCV 73.0*  --   PLT 341  --    Basic Metabolic Panel: Recent Labs  Lab 11/14/17 0115 11/14/17 0121  NA 138 140  K 2.9* 3.0*  CL 98 99  CO2 28  --   GLUCOSE 115* 110*  BUN 34* 38*  CREATININE 3.67* 4.10*  CALCIUM 9.4  --    GFR: CrCl cannot be calculated (Unknown ideal weight.). Liver Function Tests: Recent Labs  Lab 11/14/17 0115  AST 29  ALT 22  ALKPHOS 83  BILITOT 0.7  PROT 7.8  ALBUMIN 3.7   No results for input(s): LIPASE, AMYLASE in the last 168 hours. No results for input(s): AMMONIA in the last 168 hours. Coagulation Profile: Recent Labs  Lab 11/14/17 0115  INR 1.03   Cardiac Enzymes: No results for input(s): CKTOTAL, CKMB, CKMBINDEX, TROPONINI in the last 168 hours. BNP (last 3 results) No results for input(s): PROBNP in the last 8760 hours. HbA1C: No results for input(s): HGBA1C in the last 72 hours. CBG: Recent Labs  Lab 11/14/17 0113  GLUCAP 107*   Lipid Profile: No results for input(s): CHOL, HDL, LDLCALC, TRIG, CHOLHDL, LDLDIRECT in the last 72 hours. Thyroid Function Tests: No results for input(s): TSH, T4TOTAL, FREET4, T3FREE, THYROIDAB in the last 72 hours. Anemia Panel: No results for input(s): VITAMINB12, FOLATE, FERRITIN, TIBC, IRON, RETICCTPCT in the last 72 hours. Urine analysis:    Component Value Date/Time   COLORURINE YELLOW 02/03/2017 0221   APPEARANCEUR CLEAR 02/03/2017 0221   LABSPEC 1.013 02/03/2017 0221   PHURINE 6.0 02/03/2017  0221   GLUCOSEU NEGATIVE 02/03/2017 0221   HGBUR NEGATIVE 02/03/2017 0221   BILIRUBINUR NEGATIVE 02/03/2017 0221   KETONESUR NEGATIVE 02/03/2017 0221   PROTEINUR 100 (A) 02/03/2017 0221   UROBILINOGEN 0.2 03/10/2013 1535   NITRITE NEGATIVE 02/03/2017 0221   LEUKOCYTESUR NEGATIVE 02/03/2017 0221   Sepsis Labs: @LABRCNTIP (procalcitonin:4,lacticidven:4) )No results found for this or any previous visit (from the past 240 hour(s)).   Radiological Exams on Admission: Ct Head Code Stroke Wo Contrast  Result Date: 11/14/2017 CLINICAL DATA:  Code stroke. Initial evaluation for acute difficulty speaking, left-sided facial droop. EXAM: CT HEAD WITHOUT CONTRAST TECHNIQUE: Contiguous axial images were obtained from the base of the skull through the vertex without intravenous contrast. COMPARISON:  Prior CT from 02/03/2017. FINDINGS: Brain: Cerebral volume within normal limits for patient age. No evidence for acute intracranial hemorrhage. No findings to suggest acute large vessel territory infarct. No mass lesion, midline shift, or mass effect. Ventricles are normal in size without evidence for hydrocephalus. No extra-axial fluid collection identified. Vascular: No hyperdense vessel identified. Skull: Scalp soft tissues demonstrate no acute abnormality. Calvarium intact. Sinuses/Orbits: Globes and orbital soft tissues demonstrate no acute abnormality. Patient status post scleral banding on the left. Visualized paranasal sinuses are clear. No mastoid effusion. ASPECTS [facility] Stroke Program Early CT Score) - Ganglionic level infarction (caudate, lentiform nuclei, internal capsule, insula, M1-M3 cortex): 7 - Supraganglionic infarction (M4-M6 cortex): 3 Total score (0-10 with 10 being normal): 10 IMPRESSION: 1. Negative head CT.  No acute intracranial abnormality identified. 2. ASPECTS is 10. These results were communicated to [facility] at 1:30 amon 10/30/2019by text page via the [facility] messaging system.  Electronically Signed   By: Jeannine Boga M.D.   On: 11/14/2017 01:32    EKG: Independently reviewed.  Normal sinus rhythm with LVH and prolonged QTC at 118 ms.  Assessment/Plan Principal Problem:   TIA (transient ischemic attack) Active  Problems:   Gout   CKD (chronic kidney disease) stage 4, GFR 15-29 ml/min (HCC)   OSA (obstructive sleep apnea)   NICM (nonischemic cardiomyopathy) ([place])   Morbid obesity due to excess calories (HCC)   Hypertensive urgency    1. TIA versus stroke -appreciate neurology consult.  Swallow evaluation and neurochecks have been ordered.  Check hemoglobin A1c lipid panel.  Check MRI/MRA brain 2D echo and carotid Doppler.  Aspirin.  Physical therapy consult. 2. Hypertensive urgency -patient has been noncompliant with his medication and states he has not taken it last 3 days.  Blood pressure improved in the ER without intervention.  Closely follow blood pressure trends.  Restart his home medications. 3. History of nonischemic cardiomyopathy last EF measured was 30% in 2016.  Appears compensated for now.  Will hold Lasix and spironolactone for now due to possibility of stroke.  Closely observe respiratory status. 4. Gout on allopurinol. 5. Chronic kidney disease stage IV creatinine appears to be at baseline. 6. Anemia likely from renal disease follow CBC. 7. Sleep apnea has not been using his CPAP last 6 months since it broke.  Will place patient on CPAP.   DVT prophylaxis: Heparin. Code Status: Full code. Family Communication: Discussed with patient. Disposition Plan: Home. Consults called: Neurology. Admission status: Observation.   Rise Patience MD [facility] Pager [phone].  If 7PM-7AM, please contact night-coverage [email or web address] Password [email or web address]  11/14/2017, 4:22 AM

## 2017-11-14 NOTE — ED Notes (Signed)
CPAP applied due to sleep apnea.

## 2017-11-14 NOTE — ED Notes (Signed)
Patient transported to MRI 

## 2017-11-14 NOTE — ED Notes (Signed)
Pt currently in MRI- was informed he would be coming back to pod F 2 after MRI.

## 2017-11-14 NOTE — ED Provider Notes (Signed)
[facility] EMERGENCY DEPARTMENT Provider Note   CSN: [id number] Arrival date & time: 11/14/17  0104     History   Chief Complaint Chief Complaint  Patient presents with  . Code Stroke    HPI Frank Coffey is a 37 y.o. male.  Patient is a 37 year old male with past medical history of hypertension, CHF, chronic renal insufficiency, and obstructive sleep apnea.  He presents today for evaluation of numbness.  He states he went to bed this evening at approximately 11 PM, then woke up at midnight with numbness in his left arm and left-sided facial droop.  He also reported difficulty seeing the words he wanted to say.  He denies any visual disturbances.  He denies any headache.  The history is provided by the patient.    Past Medical History:  Diagnosis Date  . CHF (congestive heart failure) ([place])   . Chronic kidney disease, stage 3, mod decreased GFR (HCC)   . Gout   . 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 Hx of migraine headaches   . Hyperlipidemia   . Hypertension   . 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 Obesity   . OSA (obstructive sleep apnea)     Patient Active Problem List   Diagnosis Date Noted  . Migraine 10/02/2016  . Erectile dysfunction 10/02/2016  . Annual physical exam 02/22/2016  . Onychomycosis 02/22/2016  . Pre-operative cardiovascular examination 07/19/2015  . NICM (nonischemic cardiomyopathy) ([place]) 07/19/2015  . Morbid obesity due to excess calories ([place]) 07/19/2015  . 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 CHF (congestive heart failure) ([place]) 12/19/2012  . Benign hypertension with chronic kidney disease, stage IV ([place]) 12/19/2012  . Gout 12/19/2012  . CKD (chronic kidney disease) stage 4, GFR 15-29 ml/min (HCC) 12/19/2012  . OSA (obstructive sleep apnea) 12/19/2012    Past Surgical History:  Procedure Laterality Date  . CHOLECYSTECTOMY    . RETINAL DETACHMENT REPAIR W/  SCLERAL BUCKLE LE     Left        Home Medications    Prior to Admission medications   Medication Sig Start Date End Date Taking? Authorizing Provider  allopurinol (ZYLOPRIM) 300 MG tablet Take 400 mg by mouth daily.  06/04/17   [provider]  atorvastatin (LIPITOR) 20 MG tablet Take 1 tablet (20 mg total) by mouth daily. 06/14/17   Elby Beck, FNP  azithromycin (ZITHROMAX) 250 MG tablet Take 1 tablet (250 mg total) by mouth daily. Take first 2 tablets together, then 1 every day until finished. 10/29/17   Montine Circle, PA-C  benzonatate (TESSALON) 100 MG capsule Take 2 capsules (200 mg total) by mouth 2 (two) times daily as needed for cough. 10/29/17   Montine Circle, PA-C  carvedilol (COREG) 25 MG tablet TAKE 1 TABLET BY MOUTH TWICE DAILY WITH A MEAL 05/14/17   Brunetta Jeans, PA-C  EQ FIBER SUPPLEMENT PO Take 1 tablet by mouth daily.     [provider]  fluticasone (FLONASE) 50 MCG/ACT nasal spray Place 2 sprays into both nostrils daily. Patient taking differently: Place 2 sprays into both nostrils daily as needed for allergies.  10/03/16   Brunetta Jeans, PA-C  hydrALAZINE (APRESOLINE) 100 MG tablet Take 1 tablet (100 mg total) by mouth 3 (three) times daily. 05/23/17   Brunetta Jeans, PA-C  meloxicam (MOBIC) 7.5 MG tablet Take 1 tablet (7.5 mg total) by mouth daily. Patient not taking: Reported  on 10/29/2017 08/17/17   Ok Edwards, PA-C  sildenafil (VIAGRA) 25 MG tablet Take 1 tablet (25 mg total) by mouth daily as needed for erectile dysfunction. 10/02/16   Brunetta Jeans, PA-C  spironolactone (ALDACTONE) 25 MG tablet Take 1 tablet (25 mg total) by mouth daily. 05/23/17   Brunetta Jeans, PA-C  torsemide (DEMADEX) 20 MG tablet Take 1 tablet (20 mg total) by mouth 2 (two) times daily. Patient taking differently: Take 60 mg by mouth 2 (two) times daily.  04/27/17   Brunetta Jeans, PA-C    Family History Family History  Problem Relation Age of  Onset  . Hypertension Mother   . Hyperlipidemia Mother   . Heart disease Mother   . Hypertension Maternal Grandmother   . Stroke Maternal Grandmother   . Heart disease Maternal Grandmother   . Hyperlipidemia Maternal Grandmother   . Stroke Maternal Grandfather   . Hypertension Maternal Grandfather   . Heart disease Maternal Grandfather   . Hyperlipidemia Maternal Grandfather   . Alzheimer's disease Maternal Grandfather   . Cancer - Other Paternal Grandmother   . Healthy Brother        x2  . Healthy Sister        x2  . Healthy Son        x2  . Healthy Daughter        x1  . Diabetes Neg Hx   . Heart attack Neg Hx   . Sudden death Neg Hx     Social History Social History   Tobacco Use  . Smoking status: Never Smoker  . Smokeless tobacco: Never Used  Substance Use Topics  . Alcohol use: Yes    Comment: socially -- once every few months  . Drug use: No     Allergies   Patient has no known allergies.   Review of Systems Review of Systems  All other systems reviewed and are negative.    Physical Exam Updated Vital Signs BP (!) 200/148 (BP Location: Right Wrist)   Pulse 99   Temp 98.7 F (37.1 C) (Oral)   Resp 18   SpO2 97%   Physical Exam  Constitutional: He is oriented to person, place, and time. He appears well-developed and well-nourished. No distress.  HENT:  Head: Normocephalic and atraumatic.  Mouth/Throat: Oropharynx is clear and moist.  Eyes: Pupils are equal, round, and reactive to light. EOM are normal.  Neck: Normal range of motion. Neck supple.  Cardiovascular: Normal rate and regular rhythm. Exam reveals no friction rub.  No murmur heard. Pulmonary/Chest: Effort normal and breath sounds normal. No respiratory distress. He has no wheezes. He has no rales.  Abdominal: Soft. Bowel sounds are normal. He exhibits no distension. There is no tenderness.  Musculoskeletal: Normal range of motion. He exhibits no edema.  Neurological: He is alert and  oriented to person, place, and time. He exhibits normal muscle tone. Coordination normal.  There is a mild left-sided facial droop noted.  Skin: Skin is warm and dry. He is not diaphoretic.  Nursing note and vitals reviewed.    ED Treatments / Results  Labs (all labs ordered are listed, but only abnormal results are displayed) Labs Reviewed  CBC - Abnormal; Notable for the following components:      Result Value   WBC 11.8 (*)    RBC 6.12 (*)    Hemoglobin 12.9 (*)    MCV 73.0 (*)    MCH 21.1 (*)  MCHC 28.9 (*)    RDW 19.9 (*)    All other components within normal limits  DIFFERENTIAL - Abnormal; Notable for the following components:   Monocytes Absolute 1.3 (*)    All other components within normal limits  CBG MONITORING, ED - Abnormal; Notable for the following components:   Glucose-Capillary 107 (*)    All other components within normal limits  I-STAT CHEM 8, ED - Abnormal; Notable for the following components:   Potassium 3.0 (*)    BUN 38 (*)    Creatinine, Ser 4.10 (*)    Glucose, Bld 110 (*)    Calcium, Ion 1.13 (*)    All other components within normal limits  PROTIME-INR  APTT  COMPREHENSIVE METABOLIC PANEL  I-STAT TROPONIN, ED    EKG None  Radiology No results found.  Procedures Procedures (including critical care time)  Medications Ordered in ED Medications - No data to display   Initial Impression / Assessment and Plan / ED Course  I have reviewed the triage vital signs and the nursing notes.  Pertinent labs & imaging results that were available during my care of the patient were reviewed by me and considered in my medical decision making (see chart for details).  Patient presenting with acute neurologic deficits as described in the HPI.  He was made a code stroke in triage and immediately went for CT scan.  This was performed and was negative.  Laboratory studies are essentially baseline for the patient.  He does have chronic renal  insufficiency and his creatinine is elevated.  He was seen by neurology who feels as though the code stroke can be canceled and patient will be admitted to the hospitalist service for a TIA work-up.  Dr. Hal Hope agrees to admit.  Final Clinical Impressions(s) / ED Diagnoses   Final diagnoses:  None    ED Discharge Orders    None       Veryl Speak, MD 11/14/17 [phone]

## 2017-11-14 NOTE — Evaluation (Signed)
Physical Therapy Evaluation and Discharge Patient Details Name: Frank Coffey MRN: [id number] DOB: 1980-10-26 Today's Date: 11/14/2017   History of Present Illness  Pt is a 37 y/o male admitted secondary to L face numbness and dysphagia. Symptoms have since resolved. Found to have HTN as well. CT negaitve for acute abnormality and pt unable to tolerate MRI. PMH includes CKD, OSA, CHF, and HTN.   Clinical Impression  Patient evaluated by Physical Therapy with no further acute PT needs identified. All education has been completed and the patient has no further questions. Pt groggy and reports LE feel heavy, however, overall steady. At a mod I to independent level with all mobility tasks and able to perform dynamic gait tasks without LOB. Did not practiced stairs as pt still in emergency department, however, do not foresee that he will have any trouble with stair management. See below for any follow-up Physical Therapy or equipment needs. PT is signing off. Thank you for this referral. If needs change, please re-consult.     Follow Up Recommendations No PT follow up    Equipment Recommendations  None recommended by PT    Recommendations for Other Services       Precautions / Restrictions Precautions Precautions: Other (comment) Precaution Comments: permissive HTN up to 220/110 mmHg.  Restrictions Weight Bearing Restrictions: No      Mobility  Bed Mobility Overal bed mobility: Independent                Transfers Overall transfer level: Independent                  Ambulation/Gait Ambulation/Gait assistance: Modified independent (Device/Increase time) Gait Distance (Feet): 150 Feet Assistive device: None Gait Pattern/deviations: Step-through pattern;Decreased stride length Gait velocity: Decreased    General Gait Details: Slower gait speed, however, overall steady. Able to perform dynamic gait tasks without LOB. Pt reports other than heaviness in LEs, walking is  close to baseline.   Stairs            Wheelchair Mobility    Modified Rankin (Stroke Patients Only) Modified Rankin (Stroke Patients Only) Pre-Morbid Rankin Score: No symptoms Modified Rankin: No significant disability     Balance Overall balance assessment: Modified Independent                               Standardized Balance Assessment Standardized Balance Assessment : Dynamic Gait Index   Dynamic Gait Index Level Surface: Mild Impairment Gait with Horizontal Head Turns: Normal Gait with Vertical Head Turns: Normal Gait and Pivot Turn: Mild Impairment Step Around Obstacles: Normal       Pertinent Vitals/Pain Pain Assessment: No/denies pain    Home Living Family/patient expects to be discharged to:: Private residence Living Arrangements: Children Available Help at Discharge: Family;Available 24 hours/day Type of Home: House Home Access: Level entry     Home Layout: Two level Home Equipment: None      Prior Function Level of Independence: Independent               Hand Dominance        Extremity/Trunk Assessment   Upper Extremity Assessment Upper Extremity Assessment: Defer to OT evaluation    Lower Extremity Assessment Lower Extremity Assessment: RLE deficits/detail;LLE deficits/detail RLE Deficits / Details: Reports heaviness in RLE, however, strength WFL.  LLE Deficits / Details: Reports heaviness in RLE, however, strength WFL.     Cervical / Trunk Assessment  Cervical / Trunk Assessment: Normal  Communication   Communication: No difficulties  Cognition Arousal/Alertness: Suspect due to medications Behavior During Therapy: WFL for tasks assessed/performed Overall Cognitive Status: Within Functional Limits for tasks assessed                                 General Comments: Pt recieving Ativan earlier for MRI, so still slightly sleepy from that, but overall cognition WFL.       General Comments  General comments (skin integrity, edema, etc.): BP checked prior to session and at 160/102 mmHg. Was within permissive HTN range. Educated about "BE FAST" acronym as well.     Exercises     Assessment/Plan    PT Assessment Patent does not need any further PT services  PT Problem List         PT Treatment Interventions      PT Goals (Current goals can be found in the Care Plan section)  Acute Rehab PT Goals Patient Stated Goal: to go home PT Goal Formulation: With patient Time For Goal Achievement: 11/14/17 Potential to Achieve Goals: Good    Frequency     Barriers to discharge        Co-evaluation               AM-PAC PT "6 Clicks" Daily Activity  Outcome Measure Difficulty turning over in bed (including adjusting bedclothes, sheets and blankets)?: None Difficulty moving from lying on back to sitting on the side of the bed? : None Difficulty sitting down on and standing up from a chair with arms (e.g., wheelchair, bedside commode, etc,.)?: None Help needed moving to and from a bed to chair (including a wheelchair)?: None Help needed walking in hospital room?: None Help needed climbing 3-5 steps with a railing? : A Little 6 Click Score: 23    End of Session   Activity Tolerance: Patient tolerated treatment well Patient left: in bed;with call bell/phone within reach Nurse Communication: Mobility status PT Visit Diagnosis: Other abnormalities of gait and mobility (R26.89);Other symptoms and signs involving the nervous system (R29.898)    Time: 1749-4496 PT Time Calculation (min) (ACUTE ONLY): 17 min   Charges:   PT Evaluation $PT Eval Low Complexity: [place], PT, DPT  Acute Rehabilitation Services  Pager: [phone] Office: [phone]   Rudean Hitt 11/14/2017, 2:04 PM

## 2017-11-14 NOTE — ED Notes (Signed)
Called respiratory for CPAP due to home use and pt very drowsy for ativan.

## 2017-11-14 NOTE — Progress Notes (Addendum)
STROKE TEAM PROGRESS NOTE  HPI:( Dr Cheral Marker ) Frank Coffey is an 37 y.o. male presenting to the ED by car for evaluation of acute onset dysphasia with left facial numbness. LKN was 2200 when he went to bed. On awakening at 2300 the patient noticed left facial numbness. His son felt that the patient also had abnormal speech, so he was driven to the ED. He has no prior history of stroke, but has multiple risk factors. He is not on a blood thinner or an antiplatelet agent.   LSN: 2200 tPA Given: No: Rapidly resolving symptoms. No speech deficit or weakness on exam.  INTERVAL HISTORY No family is at the bedside.  He remains in the ED currently. He is sedated for planned MRI today. Feels he has neurologically resolved.   Vitals:   11/14/17 0618 11/14/17 0630 11/14/17 0645 11/14/17 0715  BP: 134/62 118/77 (!) 138/92 (!) 165/91  Pulse:  (!) 59 74 70  Resp:  20 (!) 33 13  Temp:      TempSrc:      SpO2:  (!) 88% 94% 98%    CBC:  Recent Labs  Lab 11/14/17 0115 11/14/17 0121 11/14/17 0446  WBC 11.8*  --  9.7  NEUTROABS 7.6  --   --   HGB 12.9* 15.3 11.8*  HCT 44.7 45.0 41.2  MCV 73.0*  --  73.2*  PLT 341  --  884    Basic Metabolic Panel:  Recent Labs  Lab 11/14/17 0115 11/14/17 0121 11/14/17 0446  NA 138 140  --   K 2.9* 3.0*  --   CL 98 99  --   CO2 28  --   --   GLUCOSE 115* 110*  --   BUN 34* 38*  --   CREATININE 3.67* 4.10* 3.52*  CALCIUM 9.4  --   --    Lipid Panel:     Component Value Date/Time   CHOL 177 11/14/2017 0446   TRIG 167 (H) 11/14/2017 0446   HDL 31 (L) 11/14/2017 0446   CHOLHDL 5.7 11/14/2017 0446   VLDL 33 11/14/2017 0446   LDLCALC 113 (H) 11/14/2017 0446   HgbA1c:  Lab Results  Component Value Date   HGBA1C 5.7 06/13/2017   Urine Drug Screen: No results found for: LABOPIA, COCAINSCRNUR, LABBENZ, AMPHETMU, THCU, LABBARB  Alcohol Level No results found for: ETH  IMAGING Ct Head Code Stroke Wo Contrast  Result Date: 11/14/2017 CLINICAL  DATA:  Code stroke. Initial evaluation for acute difficulty speaking, left-sided facial droop. EXAM: CT HEAD WITHOUT CONTRAST TECHNIQUE: Contiguous axial images were obtained from the base of the skull through the vertex without intravenous contrast. COMPARISON:  Prior CT from 02/03/2017. FINDINGS: Brain: Cerebral volume within normal limits for patient age. No evidence for acute intracranial hemorrhage. No findings to suggest acute large vessel territory infarct. No mass lesion, midline shift, or mass effect. Ventricles are normal in size without evidence for hydrocephalus. No extra-axial fluid collection identified. Vascular: No hyperdense vessel identified. Skull: Scalp soft tissues demonstrate no acute abnormality. Calvarium intact. Sinuses/Orbits: Globes and orbital soft tissues demonstrate no acute abnormality. Patient status post scleral banding on the left. Visualized paranasal sinuses are clear. No mastoid effusion. ASPECTS [facility] Stroke Program Early CT Score) - Ganglionic level infarction (caudate, lentiform nuclei, internal capsule, insula, M1-M3 cortex): 7 - Supraganglionic infarction (M4-M6 cortex): 3 Total score (0-10 with 10 being normal): 10 IMPRESSION: 1. Negative head CT.  No acute intracranial abnormality identified. 2. ASPECTS is  10. These results were communicated to [place] at 1:30 amon 10/30/2019by text page via the [facility] messaging system. Electronically Signed   By: Jeannine Boga M.D.   On: 11/14/2017 01:32    PHYSICAL EXAM Obese young   male currently not in distress  . Afebrile. Head is nontraumatic. Neck is supple without bruit.    Cardiac exam no murmur or gallop. Lungs are clear to auscultation. Distal pulses are well felt. Neurological Exam ;  Awake  Alert oriented x 3. Normal speech and language.eye movements full without nystagmus.fundi were not visualized. Vision acuity and fields appear normal. Hearing is normal. Palatal movements are normal. Face symmetric. Tongue  midline. Normal strength, tone, reflexes and coordination. Normal sensation. Gait deferred.  ASSESSMENT/PLAN Mr. Frank Coffey is a 37 y.o. male with history of CHF, CKD, ocular herpes, migraines, HTN, HLD, obesity and OSA presenting with dysarthria and L facial numbness.   Stroke vs TIA  Code Stroke CT head No acute stroke. ASPECTS 10.     MRI  pending (sedated)  MRA  pending   Carotid Doppler  pending   2D Echo  pending   LDL 113  HgbA1c 5.7  Lovenox 40 mg sq daily for VTE prophylaxis  No antithrombotic prior to admission, now on aspirin 325 mg daily. D/c antithrombotic recommendations pending   Therapy recommendations:  pending   Disposition:  pending   Hypertensive Urgency  BP 200/148 on arrival  Off meds x 3 days  BP now Stable . Permissive hypertension (OK if < 220/120) but gradually normalize in 5-7 days . Long-term BP goal normotensive  Hyperlipidemia  Home meds:  lipitor 20, resumed in hospital  Pt taking statin as prescribed  LDL 113, goal < 70  Recommend to increase lipitor dose   Continue statin at discharge  Other Stroke Risk Factors  ETOH use, advised to drink no more than 2 drink(s) a day  Morbid Obesity, There is no height or weight on file to calculate BMI., recommend weight loss, diet and exercise as appropriate   Family hx stroke (maternal grandmother, maternal grandfather)  Migraines  Obstructive sleep apnea, supposed to use CPAP at home but his machine is broken. Needs OP follow up  nonischemic cardiomyopathy   Hx Congestive heart failure  Other Active Problems  CKD stage III  Hospital day # 0  Burnetta Sabin, MSN, APRN, ANVP-BC, AGPCNP-BC Advanced Practice Stroke Nurse Orient for Schedule & Pager information 11/14/2017 10:22 AM  I have personally examined this patient, reviewed notes, independently viewed imaging studies, participated in medical decision making and plan of care.ROS  completed by me personally and pertinent positives fully documented  I have made any additions or clarifications directly to the above note. Agree with note above.  He presented with sudden onset of transient left face and arm numbness possibly a right brain subcortical TIA versus small infarct. Etiology likely small vessel disease with multiple vascular risk factors. Recommend Dual antipaltelet therapy for 3 weeks followed by aspirin. Long discussion with patient about need for aggressive risk factor modification. Consider possible participation in the sleep smart study as patient states he cannot afford his CPAP mask and hence discontinued it. Greater than 50% time during this 35 minute visit was spent on counseling and coordination of care about his TIA/stroke discussion about risks factor modification, sleep apnea and answering questions Antony Contras, MD Medical Director [place] Pager: [phone] 11/14/2017 6:15 PM  To contact Stroke Continuity provider,  please refer to [email or web address] After hours, contact General Neurology

## 2017-11-14 NOTE — ED Triage Notes (Signed)
Pt went to sleep at 10pm woke up again at 11pm with trouble speaking, L sided facial droop, L arm numbness.

## 2017-11-14 NOTE — Code Documentation (Signed)
Code Stroke was called in Triage:  LSN 2200, woke up with L sided numbness, difficulty talking, and facial weakness.  CT Head - negative. NIH 1 (partial hemianopia) Hx: Left eye retinal detachment repair.  Plan: TIA work up. Patient will need MRI.

## 2017-11-14 NOTE — ED Notes (Signed)
Canceled code stroke per dr. Cheral Marker

## 2017-11-14 NOTE — Progress Notes (Signed)
Pt transferred to 3W32 from ED via bed. Pt A&Ox4. Denies pain. Telemetry box 09 on patient - rhythm Sinus Brady. VSS. Pt on room air currently. Respiratory called about bringing CPAP for sleep tonight. Patient oriented to room and call bell within reach.

## 2017-11-14 NOTE — ED Notes (Signed)
MRI just called this RN and pt is still unable to have MRI. Pt being transported back to ED.

## 2017-11-15 ENCOUNTER — Observation Stay (HOSPITAL_COMMUNITY): Payer: Managed Care, Other (non HMO)

## 2017-11-15 DIAGNOSIS — I428 Other cardiomyopathies: Secondary | ICD-10-CM | POA: Diagnosis not present

## 2017-11-15 DIAGNOSIS — I10 Essential (primary) hypertension: Secondary | ICD-10-CM

## 2017-11-15 DIAGNOSIS — G459 Transient cerebral ischemic attack, unspecified: Secondary | ICD-10-CM | POA: Diagnosis not present

## 2017-11-15 MED ORDER — GUAIFENESIN 100 MG/5ML PO SOLN: 5.0000 mL | ORAL | Status: DC | PRN

## 2017-11-15 MED ORDER — LORAZEPAM 2 MG/ML IJ SOLN: 0.5000 mg | Freq: Once | INTRAMUSCULAR | Status: DC

## 2017-11-15 MED ORDER — HALOPERIDOL LACTATE 5 MG/ML IJ SOLN: 1.0000 mg | Freq: Four times a day (QID) | INTRAMUSCULAR | Status: DC | PRN

## 2017-11-15 MED ORDER — HALOPERIDOL LACTATE 5 MG/ML IJ SOLN
1.0000 mg | Freq: Once | INTRAMUSCULAR | Status: AC
  Administered 2017-11-15: 1 mg via INTRAVENOUS
  Filled 2017-11-15: qty 1

## 2017-11-15 MED ORDER — LORAZEPAM 2 MG/ML IJ SOLN
1.0000 mg | Freq: Once | INTRAMUSCULAR | Status: AC
  Administered 2017-11-15: 1 mg via INTRAVENOUS
  Filled 2017-11-15: qty 1

## 2017-11-15 NOTE — Progress Notes (Signed)
PROGRESS NOTE  Frank Coffey WYO:[id number] DOB: 1980-12-04 DOA: 11/14/2017 PCP: Elby Beck, FNP  Brief Narrative: 37 year old man PMH nonischemic cardiomyopathy, essential hypertension, CKD stage IV, reported dizziness, followed by vomiting, followed by left facial numbness and difficulty speaking.  Off blood pressure medications last 3 days.  Not using CPAP for 6 months secondary to broken machine.  Code stroke was called but canceled by neurology.  Noted to have markedly elevated blood pressure on presentation which improved without intervention.  Assessment/Plan Dysphagia, left facial numbness, possible TIA versus acute lacunar infarction.  CT head no acute findings.  Was unable to tolerate MRI 10/30. --Agreeable to trying to get MRI again today. --Continue ASA, statin, further recommendations per neurology  Hypertensive urgency versus emergency, off medications for 3 days.  Blood pressure improved initially without intervention. Per neurology permissive hypertension 24 hours, treat if SBP greater than 220 or DBP greater than 110 --Blood pressure better controlled.  Continue to monitor.  Nonischemic cardiomyopathy --Appears stable, LVEF appears to be at baseline, presumably hypertensive heart disease, noncompliance.  CKD stage III --Appears stable, at baseline  Hyperlipidemia --Continue statin.  Anemia of CKD --Stable.  Morbid obesity.  --  OSA --CPAP. CM referral for new machine.  Awaiting MRI to complete work-up.  Further recommendations per neurology.  Possible discharge home this evening if can be arranged.  DVT prophylaxis: enoxaparin Code Status: Full Family Communication: none Disposition Plan: home    Murray Hodgkins, MD  [facility] Direct contact: [phone] --Via amion app OR  --[email or web address]; password [email or web address]  7PM-7AM contact night coverage as above 11/15/2017, 3:23 PM  LOS: 0 days   Consultants:  Neurology    Procedures:  Echo Study Conclusions  - Left ventricle: The cavity size was normal. Wall thickness was   normal. Systolic function was moderately reduced. The estimated   ejection fraction was in the range of 35% to 40%.  Impressions:  - Technically difficult images with poor image quality .   The LV functino is moderately reduced.  Antimicrobials:    Interval history/Subjective: No issues overnight.  No neurologic complaints at this point.  No difficulty speaking, swallowing.  No numbness.  Agreeable to trying MRI again.  Objective: Vitals:  Vitals:   11/15/17 0750 11/15/17 1131  BP: (!) 182/111 (!) 164/115  Pulse: 91 83  Resp: (!) 22 19  Temp: 97.9 F (36.6 C) 98.5 F (36.9 C)  SpO2: 96% 95%    Exam: Constitutional:   . Appears calm and comfortable Eyes:  . pupils and irises appear normal ENMT:  . grossly normal hearing  Respiratory:  . CTA bilaterally, no w/r/r.  . Respiratory effort normal.  Cardiovascular:  . RRR, no m/r/g . Telemetry 1 sinus pause 2.7 seconds, 2 additional sinus pauses approximately 2 seconds.  Sinus rhythm otherwise. Musculoskeletal:  . RUE, LUE, RLE, LLE   o strength and tone normal, no atrophy, no abnormal movements Neurologic:  . CN 2-12 grossly intact Psychiatric:  . Mental status o Mood, affect appropriate  I have personally reviewed the following:   Data: . Vascular ultrasound of the carotids without significant stenosis  Scheduled Meds: .  stroke: mapping our early stages of recovery book   Does not apply Once  . allopurinol  400 mg Oral Daily  . aspirin  300 mg Rectal Daily   Or  . aspirin  325 mg Oral Daily  . atorvastatin  20 mg Oral Daily  . enoxaparin (LOVENOX) injection  40 mg Subcutaneous Daily  .  haloperidol lactate  1 mg Intravenous Once  . hydrALAZINE  100 mg Oral Q8H  . LORazepam  1 mg Intravenous Once   Continuous Infusions:  Principal Problem:   TIA (transient ischemic attack) Active  Problems:   Gout   CKD (chronic kidney disease) stage 4, GFR 15-29 ml/min (HCC)   OSA (obstructive sleep apnea)   NICM (nonischemic cardiomyopathy) ([place])   Morbid obesity due to excess calories ([place])   Hypertensive urgency   LOS: 0 days     Time 12:00-1235

## 2017-11-15 NOTE — Progress Notes (Signed)
Pt set up on CPAP 8.5CMH20 full face mask this is what patient states he wears at home.  Tolerating well.

## 2017-11-15 NOTE — Care Management Note (Signed)
Case Management Note  Patient Details  Name: Frank Coffey MRN: [id number] Date of Birth: 10-Jun-1980  Subjective/Objective:     Pt in with TIA. He is from home with family.  Pt denies any DME.  He goes to [facility] for PCP. Pt denies issues obtaining his medications.  Pt denies issues with transportation.              Action/Plan: PT with no f/u. Awaiting OT eval. CM following for d/c needs, physician orders.   Expected Discharge Date:  11/15/17               Expected Discharge Plan:  Home/Self Care  In-House Referral:     Discharge planning Services  CM Consult  Post Acute Care Choice:    Choice offered to:     DME Arranged:    DME Agency:     HH Arranged:    HH Agency:     Status of Service:  In process, will continue to follow  If discussed at Long Length of Stay Meetings, dates discussed:    Additional Comments:  Pollie Friar, RN 11/15/2017, 11:13 AM

## 2017-11-15 NOTE — Evaluation (Signed)
Speech Language Pathology Evaluation Patient Details Name: Frank Coffey MRN: [id number] DOB: [birth year] Today's Date: 11/15/2017 Time: 0998-3382 SLP Time Calculation (min) (ACUTE ONLY): 17 min  Problem List:  Patient Active Problem List   Diagnosis Date Noted  . TIA (transient ischemic attack) 11/14/2017  . Hypertensive urgency 11/14/2017  . Migraine 10/02/2016  . Erectile dysfunction 10/02/2016  . Annual physical exam 02/22/2016  . Onychomycosis 02/22/2016  . Pre-operative cardiovascular examination 07/19/2015  . NICM (nonischemic cardiomyopathy) ([place]) 07/19/2015  . Morbid obesity due to excess calories ([place]) 07/19/2015  . 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 CHF (congestive heart failure) ([place]) 12/19/2012  . Benign hypertension with chronic kidney disease, stage IV ([place]) 12/19/2012  . Gout 12/19/2012  . CKD (chronic kidney disease) stage 4, GFR 15-29 ml/min (HCC) 12/19/2012  . OSA (obstructive sleep apnea) 12/19/2012   Past Medical History:  Past Medical History:  Diagnosis Date  . CHF (congestive heart failure) ([place])   . Chronic kidney disease, stage 3, mod decreased GFR (HCC)   . Gout   . 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 Hx of migraine headaches   . Hyperlipidemia   . Hypertension   . Hypertensive heart disease with congestive heart failure and stage 3 kidney disease ([place])   . Obesity   . OSA (obstructive sleep apnea)    Past Surgical History:  Past Surgical History:  Procedure Laterality Date  . CHOLECYSTECTOMY    . RETINAL DETACHMENT REPAIR W/ SCLERAL BUCKLE LE     Left   HPI:  Pt is a 37 y/o male admitted secondary to L face numbness and dysphagia. Symptoms have since resolved. Found to have HTN as well. CT negaitve for acute abnormality and pt unable to tolerate MRI. PMH includes CKD, OSA, CHF, and HTN.    Assessment / Plan / Recommendation Clinical Impression  Pt presents with cognitive linguistic abilities that are considered to be wihtin the normal  range. Pt with appropriate ability to solve complex problems, demonstrate awareness of current situation, is oriented x 4 and demonstrates appropriate attention (depsite being sleepy). Education provided on s/s of stroke, stroke prevention. Of note, pt with regular breakfast present and no overt s/s of oropharyngeal dysphagia during consumption. No further skilled ST is indicated, ST to sign off.     SLP Assessment  SLP Recommendation/Assessment: Patient does not need any further Speech Lanaguage Pathology Services SLP Visit Diagnosis: Cognitive communication deficit (R41.841)    Follow Up Recommendations  None    Frequency and Duration           SLP Evaluation Cognition  Overall Cognitive Status: Within Functional Limits for tasks assessed Arousal/Alertness: Awake/alert Orientation Level: Oriented X4       Comprehension  Auditory Comprehension Overall Auditory Comprehension: Appears within functional limits for tasks assessed Visual Recognition/Discrimination Discrimination: Not tested Reading Comprehension Reading Status: Not tested    Expression Expression Primary Mode of Expression: Verbal Verbal Expression Overall Verbal Expression: Appears within functional limits for tasks assessed   Oral / Motor  Oral Motor/Sensory Function Overall Oral Motor/Sensory Function: Within functional limits Motor Speech Overall Motor Speech: Appears within functional limits for tasks assessed Respiration: Within functional limits Phonation: Normal Resonance: Within functional limits Articulation: Within functional limitis Intelligibility: Intelligible Motor Planning: Witnin functional limits Motor Speech Errors: Not applicable   GO                    Katena Petitjean 11/15/2017, 8:58 AM

## 2017-11-15 NOTE — Progress Notes (Signed)
STROKE TEAM PROGRESS NOTE    INTERVAL HISTORY No family is at the bedside.   [name] He is  feeling much better today Feels he has neurologically resolved. He was able to tolerate CPAP last night but unable to tolerate MRI even with sedation with Ativan but is willing to try again with more sedation today  Vitals:   11/15/17 0002 11/15/17 0401 11/15/17 0750 11/15/17 1131  BP: (!) 154/100 (!) 183/109 (!) 182/111 (!) 164/115  Pulse: 72 80 91 83  Resp: 18 17 (!) 22 19  Temp: 98.2 F (36.8 C) 97.9 F (36.6 C) 97.9 F (36.6 C) 98.5 F (36.9 C)  TempSrc: Oral Oral Oral   SpO2: 98% 99% 96% 95%    CBC:  Recent Labs  Lab 11/14/17 0115 11/14/17 0121 11/14/17 0446  WBC 11.8*  --  9.7  NEUTROABS 7.6  --   --   HGB 12.9* 15.3 11.8*  HCT 44.7 45.0 41.2  MCV 73.0*  --  73.2*  PLT 341  --  542    Basic Metabolic Panel:  Recent Labs  Lab 11/14/17 0115 11/14/17 0121 11/14/17 0446  NA 138 140  --   K 2.9* 3.0*  --   CL 98 99  --   CO2 28  --   --   GLUCOSE 115* 110*  --   BUN 34* 38*  --   CREATININE 3.67* 4.10* 3.52*  CALCIUM 9.4  --   --    Lipid Panel:     Component Value Date/Time   CHOL 177 11/14/2017 0446   TRIG 167 (H) 11/14/2017 0446   HDL 31 (L) 11/14/2017 0446   CHOLHDL 5.7 11/14/2017 0446   VLDL 33 11/14/2017 0446   LDLCALC 113 (H) 11/14/2017 0446   HgbA1c:  Lab Results  Component Value Date   HGBA1C 5.8 (H) 11/14/2017   Urine Drug Screen: No results found for: LABOPIA, COCAINSCRNUR, LABBENZ, AMPHETMU, THCU, LABBARB  Alcohol Level No results found for: ETH  IMAGING Ct Head Code Stroke Wo Contrast  Result Date: 11/14/2017 CLINICAL DATA:  Code stroke. Initial evaluation for acute difficulty speaking, left-sided facial droop. EXAM: CT HEAD WITHOUT CONTRAST TECHNIQUE: Contiguous axial images were obtained from the base of the skull through the vertex without intravenous contrast. COMPARISON:  Prior CT from 02/03/2017. FINDINGS: Brain: Cerebral volume within normal  limits for patient age. No evidence for acute intracranial hemorrhage. No findings to suggest acute large vessel territory infarct. No mass lesion, midline shift, or mass effect. Ventricles are normal in size without evidence for hydrocephalus. No extra-axial fluid collection identified. Vascular: No hyperdense vessel identified. Skull: Scalp soft tissues demonstrate no acute abnormality. Calvarium intact. Sinuses/Orbits: Globes and orbital soft tissues demonstrate no acute abnormality. Patient status post scleral banding on the left. Visualized paranasal sinuses are clear. No mastoid effusion. ASPECTS [facility] Stroke Program Early CT Score) - Ganglionic level infarction (caudate, lentiform nuclei, internal capsule, insula, M1-M3 cortex): 7 - Supraganglionic infarction (M4-M6 cortex): 3 Total score (0-10 with 10 being normal): 10 IMPRESSION: 1. Negative head CT.  No acute intracranial abnormality identified. 2. ASPECTS is 10. These results were communicated to [place] at 1:30 amon 10/30/2019by text page via the [facility] messaging system. Electronically Signed   By: Jeannine Boga M.D.   On: 11/14/2017 01:32   Vas US Carotid (at [place] Only)  Result Date: 11/14/2017 Carotid Arterial Duplex Study Indications:  CVA. Risk Factors: Hypertension, hyperlipidemia. Performing Technologist: Maudry Mayhew MHA, RDMS, RVT, RDCS  Examination Guidelines: A complete evaluation includes B-mode imaging, spectral Doppler, color Doppler, and power Doppler as needed of all accessible portions of each vessel. Bilateral testing is considered an integral part of a complete examination. Limited examinations for reoccurring indications may be performed as noted.  Right Carotid Findings: +----------+--------+--------+--------+--------+--------+           PSV cm/sEDV cm/sStenosisDescribeComments +----------+--------+--------+--------+--------+--------+ CCA Prox  111     22                                +----------+--------+--------+--------+--------+--------+ CCA Distal67      26                               +----------+--------+--------+--------+--------+--------+ ICA Prox  40      15                               +----------+--------+--------+--------+--------+--------+ ICA Distal70      30                               +----------+--------+--------+--------+--------+--------+ ECA       63      12                               +----------+--------+--------+--------+--------+--------+ +----------+--------+-------+----------------+-------------------+           PSV cm/sEDV cmsDescribe        Arm Pressure (mmHG) +----------+--------+-------+----------------+-------------------+ [id number]             Multiphasic, WNL                    +----------+--------+-------+----------------+-------------------+ +---------+--------+--+--------+--+---------+ VertebralPSV cm/s29EDV cm/s12Antegrade +---------+--------+--+--------+--+---------+  Left Carotid Findings: +----------+--------+--------+--------+--------+------------------+           PSV cm/sEDV cm/sStenosisDescribeComments           +----------+--------+--------+--------+--------+------------------+ CCA Prox  109     17                                         +----------+--------+--------+--------+--------+------------------+ CCA Distal83      27                                         +----------+--------+--------+--------+--------+------------------+ ICA Prox  51      18                      intimal thickening +----------+--------+--------+--------+--------+------------------+ ICA Distal56      25                                         +----------+--------+--------+--------+--------+------------------+ ECA       74      13                                         +----------+--------+--------+--------+--------+------------------+  +----------+--------+--------+----------------+-------------------+  SubclavianPSV cm/sEDV cm/sDescribe        Arm Pressure (mmHG) +----------+--------+--------+----------------+-------------------+           84              Multiphasic, WNL                    +----------+--------+--------+----------------+-------------------+ +---------+--------+--+--------+--+---------+ VertebralPSV cm/s62EDV cm/s23Antegrade +---------+--------+--+--------+--+---------+  Summary: Right Carotid: Velocities in the right ICA are consistent with a 1-39% stenosis. Left Carotid: Velocities in the left ICA are consistent with a 1-39% stenosis. Vertebrals:  Bilateral vertebral arteries demonstrate antegrade flow. Subclavians: Normal flow hemodynamics were seen in bilateral subclavian              arteries. *See table(s) above for measurements and observations.  Electronically signed by Antony Contras MD on 11/14/2017 at 4:02:16 PM.    Final     PHYSICAL EXAM Obese young   male currently not in distress  . Afebrile. Head is nontraumatic. Neck is supple without bruit.    Cardiac exam no murmur or gallop. Lungs are clear to auscultation. Distal pulses are well felt. Neurological Exam ;  Awake  Alert oriented x 3. Normal speech and language.eye movements full without nystagmus.fundi were not visualized. Vision acuity and fields appear normal. Hearing is normal. Palatal movements are normal. Face symmetric. Tongue midline. Normal strength, tone, reflexes and coordination. Normal sensation. Gait deferred.  ASSESSMENT/PLAN Frank Coffey is a 37 y.o. male with history of CHF, CKD, ocular herpes, migraines, HTN, HLD, obesity and OSA presenting with dysarthria and L facial numbness.   Stroke vs TIA  Code Stroke CT head No acute stroke. ASPECTS 10.     MRI  pending (sedated)  MRA  pending   Carotid Doppler  pending   2D Echo  pending   LDL 113  HgbA1c 5.7  Lovenox 40 mg sq daily for VTE  prophylaxis  No antithrombotic prior to admission, now on aspirin 325 mg daily. D/c antithrombotic recommendations pending   Therapy recommendations:  pending   Disposition:  pending   Hypertensive Urgency  BP 200/148 on arrival  Off meds x 3 days  BP now Stable . Permissive hypertension (OK if < 220/120) but gradually normalize in 5-7 days . Long-term BP goal normotensive  Hyperlipidemia  Home meds:  lipitor 20, resumed in hospital  Pt taking statin as prescribed  LDL 113, goal < 70  Recommend to increase lipitor dose   Continue statin at discharge  Other Stroke Risk Factors  ETOH use, advised to drink no more than 2 drink(s) a day  Morbid Obesity, There is no height or weight on file to calculate BMI., recommend weight loss, diet and exercise as appropriate   Family hx stroke (maternal grandmother, maternal grandfather)  Migraines  Obstructive sleep apnea, supposed to use CPAP at home but his machine is broken. Needs OP follow up  nonischemic cardiomyopathy   Hx Congestive heart failure  Other Active Problems  CKD stage III  Hospital day # 0   .  He presented with sudden onset of transient left face and arm numbness possibly a right brain subcortical TIA versus small infarct. Etiology likely small vessel disease with multiple vascular risk factors. Recommend Dual antipaltelet therapy for 3 weeks followed by aspirin. Long discussion with patient about need for aggressive risk factor modification. Plan to reattempt to get MRI done with more sedation today  Antony Contras, MD Medical Director [facility] Pager: [phone] 11/15/2017 3:14 PM  To contact Stroke Continuity provider, please refer to [email or web address] After hours, contact General Neurology

## 2017-11-15 NOTE — Evaluation (Signed)
Occupational Therapy Evaluation and Discharge Patient Details Name: Frank Coffey MRN: [id number] DOB: [birth year] Today's Date: 11/15/2017    History of Present Illness Pt is a 37 y/o male admitted secondary to L face numbness and dysphagia. Symptoms have since resolved. Found to have HTN as well. CT negaitve for acute abnormality and pt unable to tolerate MRI. PMH includes CKD, OSA, CHF, and HTN.    Clinical Impression   Pt ambulating in room without use of AD for functional transfers and toileting needs at mod I level. Pt ambulating to 100' and ascending/decending 2 flights of stairs with use of R rail at mod I level. Pt verbalizes performing self care without issue at this time. No further OT concerns. OT SIGNING off. Thank you for referral.    Follow Up Recommendations  No OT follow up    Equipment Recommendations  None recommended by OT    Recommendations for Other Services       Precautions / Restrictions Restrictions Weight Bearing Restrictions: No      Mobility Bed Mobility Overal bed mobility: Independent       Transfers Overall transfer level: Independent        Balance Overall balance assessment: Modified Independent          ADL either performed or assessed with clinical judgement   ADL Overall ADL's : Independent            Vision Baseline Vision/History: No visual deficits              Pertinent Vitals/Pain Pain Assessment: No/denies pain     Hand Dominance Right   Extremity/Trunk Assessment Upper Extremity Assessment Upper Extremity Assessment: Overall WFL for tasks assessed   Lower Extremity Assessment Lower Extremity Assessment: Overall WFL for tasks assessed   Cervical / Trunk Assessment Cervical / Trunk Assessment: Normal   Communication Communication Communication: No difficulties   Cognition Arousal/Alertness: Suspect due to medications Behavior During Therapy: WFL for tasks assessed/performed Overall Cognitive  Status: Within Functional Limits for tasks assessed                     Home Living Family/patient expects to be discharged to:: Private residence Living Arrangements: Children Available Help at Discharge: Family;Available 24 hours/day Type of Home: House Home Access: Level entry     Home Layout: Two level Alternate Level Stairs-Number of Steps: flight Alternate Level Stairs-Rails: Right Bathroom Shower/Tub: Walk-in shower;Tub/shower unit   Bathroom Toilet: Standard     Home Equipment: None          Prior Functioning/Environment Level of Independence: Independent                         OT Goals(Current goals can be found in the care plan section) Acute Rehab OT Goals Patient Stated Goal: to go home OT Goal Formulation: With patient Time For Goal Achievement: 11/29/17 Potential to Achieve Goals: Good  OT Frequency:                AM-PAC PT "6 Clicks" Daily Activity     Outcome Measure Help from another person eating meals?: None Help from another person taking care of personal grooming?: None Help from another person toileting, which includes using toliet, bedpan, or urinal?: None Help from another person bathing (including washing, rinsing, drying)?: None Help from another person to put on and taking off regular upper body clothing?: None Help from another person to put on and taking  off regular lower body clothing?: None 6 Click Score: 24   End of Session    Activity Tolerance: Patient tolerated treatment well Patient left: in bed;with call bell/phone within reach  OT Visit Diagnosis: Hemiplegia and hemiparesis Hemiplegia - Right/Left: Right Hemiplegia - dominant/non-dominant: Dominant Hemiplegia - caused by: Unspecified                Time: 1430-1441 OT Time Calculation (min): 11 min Charges:  OT General Charges $OT Visit: 1 Visit OT Evaluation $OT Eval Low Complexity: 1 Low   Raeden Belzer P, MS, OTR/L 11/15/2017, 3:19 PM

## 2017-11-16 ENCOUNTER — Telehealth: Payer: Self-pay | Admitting: Pulmonary Disease

## 2017-11-16 ENCOUNTER — Other Ambulatory Visit: Payer: Self-pay | Admitting: Cardiology

## 2017-11-16 DIAGNOSIS — E876 Hypokalemia: Secondary | ICD-10-CM

## 2017-11-16 DIAGNOSIS — I441 Atrioventricular block, second degree: Secondary | ICD-10-CM

## 2017-11-16 DIAGNOSIS — I161 Hypertensive emergency: Secondary | ICD-10-CM

## 2017-11-16 DIAGNOSIS — G459 Transient cerebral ischemic attack, unspecified: Secondary | ICD-10-CM | POA: Diagnosis not present

## 2017-11-16 DIAGNOSIS — I428 Other cardiomyopathies: Secondary | ICD-10-CM | POA: Diagnosis not present

## 2017-11-16 MED ORDER — SPIRONOLACTONE 25 MG PO TABS
25.0000 mg | ORAL_TABLET | Freq: Every day | ORAL | Status: DC
  Administered 2017-11-16: 25 mg via ORAL
  Filled 2017-11-16: qty 1

## 2017-11-16 MED ORDER — HYDRALAZINE HCL 50 MG PO TABS
100.0000 mg | ORAL_TABLET | Freq: Three times a day (TID) | ORAL | Status: DC
  Administered 2017-11-16: 100 mg via ORAL
  Filled 2017-11-16: qty 2

## 2017-11-16 MED ORDER — ENOXAPARIN SODIUM 30 MG/0.3ML ~~LOC~~ SOLN: 30.0000 mg | Freq: Every day | SUBCUTANEOUS | Status: DC

## 2017-11-16 MED ORDER — TORSEMIDE 20 MG PO TABS
20.0000 mg | ORAL_TABLET | Freq: Two times a day (BID) | ORAL | Status: DC
  Administered 2017-11-16: 20 mg via ORAL
  Filled 2017-11-16: qty 1

## 2017-11-16 MED ORDER — CLOPIDOGREL BISULFATE 75 MG PO TABS: 75.0000 mg | ORAL_TABLET | Freq: Every day | ORAL | 0 refills | Status: DC

## 2017-11-16 MED ORDER — HYDRALAZINE HCL 100 MG PO TABS: 100.0000 mg | ORAL_TABLET | Freq: Three times a day (TID) | ORAL | 0 refills | Status: DC

## 2017-11-16 MED ORDER — CARVEDILOL 12.5 MG PO TABS
25.0000 mg | ORAL_TABLET | Freq: Two times a day (BID) | ORAL | Status: DC
  Administered 2017-11-16: 25 mg via ORAL
  Filled 2017-11-16: qty 2

## 2017-11-16 MED ORDER — ASPIRIN 81 MG PO TABS: 81.0000 mg | ORAL_TABLET | Freq: Every day | ORAL | Status: DC

## 2017-11-16 MED ORDER — POTASSIUM CHLORIDE CRYS ER 20 MEQ PO TBCR: 20.0000 meq | EXTENDED_RELEASE_TABLET | Freq: Every day | ORAL | 0 refills | Status: DC

## 2017-11-16 MED ORDER — POTASSIUM CHLORIDE CRYS ER 20 MEQ PO TBCR
60.0000 meq | EXTENDED_RELEASE_TABLET | Freq: Once | ORAL | Status: AC
  Administered 2017-11-16: 60 meq via ORAL
  Filled 2017-11-16: qty 3

## 2017-11-16 NOTE — Telephone Encounter (Signed)
Called and spoke with [name], [facility] case manager.  She stated that the Patient is requesting new CPAP and supplies.  His last OV was 11/14/2016, with Dr. Elsworth Soho. She said she would tell the Patient that he needs to call [name] to schedule follow up visit, with Dr. Elsworth Soho, or one of our NP's, to get CPAP, and new supplies.  Nothing further at this time.

## 2017-11-16 NOTE — Consult Note (Addendum)
Cardiology Consultation:   Patient ID: Frank Coffey MRN: [id number]; DOB: 06-Jul-1980  Admit date: 11/14/2017 Date of Consult: 11/16/2017  Primary Care Provider: Elby Beck, FNP Primary Cardiologist: Skeet Latch, MD  Primary Electrophysiologist:  None    Patient Profile:   Frank Coffey is a 37 y.o. male with a hx of dilated CM and return to normal function, but again decrease in 0626, chronic systolic HF, HTN with RSW-5,IOE, OSA with CPAP who is being seen today for the evaluation of heart block after admit with possible Rt brain subcortical TIA, vs. Small infarct at the request of Dr. Sarajane Jews.  History of Present Illness:   Mr. 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   Has hx of OSA but CPAP has been broken.    Now admitted with acute dysphasia and Lt facial numbness.  TPA was NOT given.  Felt to be possible Rt brain subcortical TIA, vs. Small infarct.  EKG ST with LVH. No acute changes, I personally reviewed.  Tele: I personally reviewed Mobitz I heart block with HR to 30 at times.  This is with napping and not wearing his CPAP.   Na 138, K+ 2.9 in ER today 3.0, Cr 3.67, BUN 34, LFTs WNL  BNP 100 Troponin poc 0.07-.0.6 WBC 11.8 on admit now 9.7, today HGB 11.8, Plts 321  ddimer 0.39 Hgb A1C 5.8  Echo today EF 35-40%, wall thickness was normal Poor image quality   Carotid dopplers with 1-39% stenosis bil.  Currently resting had to waken for eval.  And he kept falling asleep while I was talking to him.  He had apnea as well.  I placed CPAP back on with his napping and he maintained SR. He was on coreg BID but currently on hold- did receive this AM.   No chest pain and no SOB, his PCP has been ordering meds. He is on BB, Demadex , spironolactone, hydralazine.     Past Medical History:  Diagnosis Date  . CHF (congestive heart  failure) ([place])   . Chronic kidney disease, stage 3, mod decreased GFR (HCC)   . Gout   . 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 Hx of migraine headaches   . Hyperlipidemia   . Hypertension   . Hypertensive heart disease with congestive heart failure and stage 3 kidney disease ([place])   . Obesity   . OSA (obstructive sleep apnea)     Past Surgical History:  Procedure Laterality Date  . CHOLECYSTECTOMY    . RETINAL DETACHMENT REPAIR W/ SCLERAL Coffey LE     Left     Home Medications:  Prior to Admission medications   Medication Sig Start Date End Date Taking? Authorizing Provider  allopurinol (ZYLOPRIM) 100 MG tablet Take 100 mg by mouth daily.   Yes [provider]  allopurinol (ZYLOPRIM) 300 MG tablet Take 300 mg by mouth daily.  06/04/17  Yes [provider]  atorvastatin (LIPITOR) 20 MG tablet Take 1 tablet (20 mg total) by mouth daily. 06/14/17  Yes Elby Beck, FNP  carvedilol (COREG) 25 MG tablet TAKE 1 TABLET BY MOUTH TWICE DAILY WITH A MEAL Patient taking differently: Take 25 mg by mouth 2 (two) times daily with a meal.  05/14/17  Yes Brunetta Jeans, PA-C  EQ FIBER SUPPLEMENT PO Take 2 tablets by  mouth daily.    Yes [provider]  fluticasone (FLONASE) 50 MCG/ACT nasal spray Place 2 sprays into both nostrils daily. Patient taking differently: Place 2 sprays into both nostrils daily as needed for allergies.  10/03/16  Yes Brunetta Jeans, PA-C  hydrALAZINE (APRESOLINE) 100 MG tablet Take 1 tablet (100 mg total) by mouth 3 (three) times daily. 05/23/17  Yes Brunetta Jeans, PA-C  sildenafil (VIAGRA) 25 MG tablet Take 1 tablet (25 mg total) by mouth daily as needed for erectile dysfunction. 10/02/16  Yes Brunetta Jeans, PA-C  spironolactone (ALDACTONE) 25 MG tablet Take 1 tablet (25 mg total) by mouth daily. 05/23/17  Yes Brunetta Jeans, PA-C  torsemide (DEMADEX) 20 MG tablet Take 1 tablet (20 mg total) by mouth 2 (two) times daily. Patient taking  differently: Take 40 mg by mouth 2 (two) times daily.  04/27/17  Yes Brunetta Jeans, PA-C  azithromycin (ZITHROMAX) 250 MG tablet Take 1 tablet (250 mg total) by mouth daily. Take first 2 tablets together, then 1 every day until finished. Patient not taking: Reported on 11/14/2017 10/29/17   Montine Circle, PA-C  benzonatate (TESSALON) 100 MG capsule Take 2 capsules (200 mg total) by mouth 2 (two) times daily as needed for cough. Patient not taking: Reported on 11/14/2017 10/29/17   Montine Circle, PA-C  meloxicam (MOBIC) 7.5 MG tablet Take 1 tablet (7.5 mg total) by mouth daily. Patient not taking: Reported on 11/14/2017 08/17/17   Ok Edwards, PA-C    Inpatient Medications: Scheduled Meds: .  stroke: mapping our early stages of recovery book   Does not apply Once  . allopurinol  400 mg Oral Daily  . aspirin  300 mg Rectal Daily   Or  . aspirin  325 mg Oral Daily  . atorvastatin  20 mg Oral Daily  . [START ON 11/17/2017] enoxaparin (LOVENOX) injection  30 mg Subcutaneous Daily  . hydrALAZINE  100 mg Oral Q8H  . spironolactone  25 mg Oral Daily  . torsemide  20 mg Oral BID   Continuous Infusions:  PRN Meds: acetaminophen **OR** acetaminophen (TYLENOL) oral liquid 160 mg/5 mL **OR** acetaminophen, fluticasone, guaiFENesin, hydrALAZINE, Influenza vac split quadrivalent PF, senna-docusate  Allergies:   No Known Allergies  Social History:   Social History   Socioeconomic History  . Marital status: Divorced    Spouse name: Not on file  . Number of children: Not on file  . Years of education: Not on file  . Highest education level: Not on file  Occupational History  . Not on file  Social Needs  . Financial resource strain: Not on file  . Food insecurity:    Worry: Not on file    Inability: Not on file  . Transportation needs:    Medical: Not on file    Non-medical: Not on file  Tobacco Use  . Smoking status: Never Smoker  . Smokeless tobacco: Never Used  Substance and  Sexual Activity  . Alcohol use: Yes    Comment: socially -- once every few months  . Drug use: No  . Sexual activity: Yes    Partners: Female  Lifestyle  . Physical activity:    Days per week: Not on file    Minutes per session: Not on file  . Stress: Not on file  Relationships  . Social connections:    Talks on phone: Not on file    Gets together: Not on file    Attends religious service:  Not on file    Active member of club or organization: Not on file    Attends meetings of clubs or organizations: Not on file    Relationship status: Not on file  . Intimate partner violence:    Fear of current or ex partner: Not on file    Emotionally abused: Not on file    Physically abused: Not on file    Forced sexual activity: Not on file  Other Topics Concern  . Not on file  Social History Narrative   Epworth Sleepiness Scale = 10 (as of 12/01/14)    Family History:    Family History  Problem Relation Age of Onset  . Hypertension Mother   . Hyperlipidemia Mother   . Heart disease Mother   . Hypertension Maternal Grandmother   . Stroke Maternal Grandmother   . Heart disease Maternal Grandmother   . Hyperlipidemia Maternal Grandmother   . Stroke Maternal Grandfather   . Hypertension Maternal Grandfather   . Heart disease Maternal Grandfather   . Hyperlipidemia Maternal Grandfather   . Alzheimer's disease Maternal Grandfather   . Cancer - Other Paternal Grandmother   . Healthy Brother        x2  . Healthy Sister        x2  . Healthy Son        x2  . Healthy Daughter        x1  . Diabetes Neg Hx   . Heart attack Neg Hx   . Sudden death Neg Hx      ROS:  Please see the history of present illness.  General:no colds or fevers, no weight changes Skin:no rashes or ulcers HEENT:no blurred vision, no congestion CV:see HPI PUL:see HPI GI:no diarrhea constipation or melena, no indigestion GU:no hematuria, no dysuria MS:no joint pain, no claudication Neuro:no syncope, no  lightheadedness- + numbness of face Endo:no diabetes, no thyroid disease  All other ROS reviewed and negative.     Physical Exam/Data:   Vitals:   11/16/17 0052 11/16/17 0435 11/16/17 0820 11/16/17 1302  BP: (!) 159/117 (!) 188/107 (!) 157/127 (!) 141/81  Pulse: 90 97 84 86  Resp:  20  18  Temp:  98.5 F (36.9 C) 97.8 F (36.6 C) 98.3 F (36.8 C)  TempSrc:  Oral Oral Oral  SpO2:  (!) 89% 95% 93%    Intake/Output Summary (Last 24 hours) at 11/16/2017 1403 Last data filed at 11/16/2017 0900 Gross per 24 hour  Intake 240 ml  Output -  Net 240 ml   There were no vitals filed for this visit. There is no height or weight on file to calculate BMI.  General:  Well nourished, well developed, in no acute distress HEENT: normal Lymph: no adenopathy Neck: no JVD Endocrine:  No thryomegaly Vascular: No carotid bruits; pedal pulses 2+ bilaterally  Cardiac:  normal S1, S2; RRR; no murmur gallup rub or click Lungs:  clear to auscultation bilaterally, no wheezing, rhonchi or rales  Abd: obese,soft, nontender, no hepatomegaly  Ext: no edema Musculoskeletal:  No deformities, BUE and BLE strength normal and equal Skin: warm and dry  Neuro:  Alert and oriented X 3 MAE follows commands, no focal abnormalities noted Psych:  Normal affect   Relevant CV Studies: Echo 12/09/14 Study Conclusions  - Left ventricle: The cavity size was mildly dilated. Wall   thickness was increased in a pattern of mild LVH. The estimated   ejection fraction was 30%. Diffuse hypokinesis. Doppler  parameters are consistent with abnormal left ventricular   relaxation (grade 1 diastolic dysfunction). - Aortic valve: There was no stenosis. - Mitral valve: There was trivial regurgitation. - Left atrium: The atrium was mildly dilated. - Right ventricle: The cavity size was mildly dilated. Systolic   function was normal. - Pulmonary arteries: No complete TR doppler jet so unable to   estimate PA systolic  pressure. - Inferior vena cava: The vessel was normal in size. The   respirophasic diameter changes were in the normal range (>= 50%),   consistent with normal central venous pressure.  Impressions:  - Mildly dilated LV with mild LV hypertrophy. EF 30%, diffuse   hypokinesis. Mildly dilated RV with normal systolic function. No   significant valvular abnormalities.  Echo 11/14/17 Study Conclusions  - Left ventricle: The cavity size was normal. Wall thickness was   normal. Systolic function was moderately reduced. The estimated   ejection fraction was in the range of 35% to 40%.  Impressions:  - Technically difficult images with poor image quality .   The LV functino is moderately reduced. Laboratory Data:  Chemistry Recent Labs  Lab 11/14/17 0115 11/14/17 0121 11/14/17 0446  NA 138 140  --   K 2.9* 3.0*  --   CL 98 99  --   CO2 28  --   --   GLUCOSE 115* 110*  --   BUN 34* 38*  --   CREATININE 3.67* 4.10* 3.52*  CALCIUM 9.4  --   --   GFRNONAA 19*  --  20*  GFRAA 22*  --  24*  ANIONGAP 12  --   --     Recent Labs  Lab 11/14/17 0115  PROT 7.8  ALBUMIN 3.7  AST 29  ALT 22  ALKPHOS 83  BILITOT 0.7   Hematology Recent Labs  Lab 11/14/17 0115 11/14/17 0121 11/14/17 0446  WBC 11.8*  --  9.7  RBC 6.12*  --  5.63  HGB 12.9* 15.3 11.8*  HCT 44.7 45.0 41.2  MCV 73.0*  --  73.2*  MCH 21.1*  --  21.0*  MCHC 28.9*  --  28.6*  RDW 19.9*  --  19.9*  PLT 341  --  321   Cardiac EnzymesNo results for input(s): TROPONINI in the last 168 hours.  Recent Labs  Lab 11/14/17 0119  TROPIPOC 0.06    BNPNo results for input(s): BNP, PROBNP in the last 168 hours.  DDimer No results for input(s): DDIMER in the last 168 hours.  Radiology/Studies:  Mr Brain Wo Contrast  Result Date: 11/15/2017 CLINICAL DATA:  Difficulty speaking and left facial droop EXAM: MRI HEAD WITHOUT CONTRAST MRA HEAD WITHOUT CONTRAST TECHNIQUE: Multiplanar, multiecho pulse sequences of  the brain and surrounding structures were obtained without intravenous contrast. Angiographic images of the head were obtained using MRA technique without contrast. COMPARISON:  Head CT 11/14/2017 FINDINGS: MRI HEAD FINDINGS BRAIN: There is no acute infarct, acute hemorrhage or mass effect. The midline structures are normal. There are no old infarcts. The white matter signal is normal for the patient's age. The CSF spaces are normal for age, with no hydrocephalus. Single focus of chronic microhemorrhage in the right thalamus. SKULL AND UPPER CERVICAL SPINE: The visualized skull base, calvarium, upper cervical spine and extracranial soft tissues are normal. SINUSES/ORBITS: No fluid levels or advanced mucosal thickening. No mastoid or middle ear effusion. The orbits are normal. MRA HEAD FINDINGS Intracranial internal carotid arteries: Normal. Anterior cerebral arteries: Normal. Middle cerebral  arteries: Normal. Posterior communicating arteries: Present bilaterally. Posterior cerebral arteries: Normal. Basilar artery: Normal. Vertebral arteries: Left dominant. Normal. Superior cerebellar arteries: Normal. Inferior cerebellar arteries: Normal. IMPRESSION: 1. Single focus of chronic microhemorrhage in the right thalamus, possibly due to hypertension. Otherwise normal MRI of the brain. 2. Normal intracranial MRA. Electronically Signed   By: Ulyses Jarred M.D.   On: 11/15/2017 19:31   Mr Jodene Nam Head Wo Contrast  Result Date: 11/15/2017 CLINICAL DATA:  Difficulty speaking and left facial droop EXAM: MRI HEAD WITHOUT CONTRAST MRA HEAD WITHOUT CONTRAST TECHNIQUE: Multiplanar, multiecho pulse sequences of the brain and surrounding structures were obtained without intravenous contrast. Angiographic images of the head were obtained using MRA technique without contrast. COMPARISON:  Head CT 11/14/2017 FINDINGS: MRI HEAD FINDINGS BRAIN: There is no acute infarct, acute hemorrhage or mass effect. The midline structures are  normal. There are no old infarcts. The white matter signal is normal for the patient's age. The CSF spaces are normal for age, with no hydrocephalus. Single focus of chronic microhemorrhage in the right thalamus. SKULL AND UPPER CERVICAL SPINE: The visualized skull base, calvarium, upper cervical spine and extracranial soft tissues are normal. SINUSES/ORBITS: No fluid levels or advanced mucosal thickening. No mastoid or middle ear effusion. The orbits are normal. MRA HEAD FINDINGS Intracranial internal carotid arteries: Normal. Anterior cerebral arteries: Normal. Middle cerebral arteries: Normal. Posterior communicating arteries: Present bilaterally. Posterior cerebral arteries: Normal. Basilar artery: Normal. Vertebral arteries: Left dominant. Normal. Superior cerebellar arteries: Normal. Inferior cerebellar arteries: Normal. IMPRESSION: 1. Single focus of chronic microhemorrhage in the right thalamus, possibly due to hypertension. Otherwise normal MRI of the brain. 2. Normal intracranial MRA. Electronically Signed   By: Ulyses Jarred M.D.   On: 11/15/2017 19:31   Ct Head Code Stroke Wo Contrast  Result Date: 11/14/2017 CLINICAL DATA:  Code stroke. Initial evaluation for acute difficulty speaking, left-sided facial droop. EXAM: CT HEAD WITHOUT CONTRAST TECHNIQUE: Contiguous axial images were obtained from the base of the skull through the vertex without intravenous contrast. COMPARISON:  Prior CT from 02/03/2017. FINDINGS: Brain: Cerebral volume within normal limits for patient age. No evidence for acute intracranial hemorrhage. No findings to suggest acute large vessel territory infarct. No mass lesion, midline shift, or mass effect. Ventricles are normal in size without evidence for hydrocephalus. No extra-axial fluid collection identified. Vascular: No hyperdense vessel identified. Skull: Scalp soft tissues demonstrate no acute abnormality. Calvarium intact. Sinuses/Orbits: Globes and orbital soft tissues  demonstrate no acute abnormality. Patient status post scleral banding on the left. Visualized paranasal sinuses are clear. No mastoid effusion. ASPECTS [facility] Stroke Program Early CT Score) - Ganglionic level infarction (caudate, lentiform nuclei, internal capsule, insula, M1-M3 cortex): 7 - Supraganglionic infarction (M4-M6 cortex): 3 Total score (0-10 with 10 being normal): 10 IMPRESSION: 1. Negative head CT.  No acute intracranial abnormality identified. 2. ASPECTS is 10. These results were communicated to [place] at 1:30 amon 10/30/2019by text page via the [facility] messaging system. Electronically Signed   By: Jeannine Boga M.D.   On: 11/14/2017 01:32   Vas US Carotid (at [place] Only)  Result Date: 11/14/2017 Carotid Arterial Duplex Study Indications:  CVA. Risk Factors: Hypertension, hyperlipidemia. Performing Technologist: Maudry Mayhew MHA, RDMS, RVT, RDCS  Examination Guidelines: A complete evaluation includes B-mode imaging, spectral Doppler, color Doppler, and power Doppler as needed of all accessible portions of each vessel. Bilateral testing is considered an integral part of a complete examination. Limited examinations for reoccurring indications may be  performed as noted.  Right Carotid Findings: +----------+--------+--------+--------+--------+--------+           PSV cm/sEDV cm/sStenosisDescribeComments +----------+--------+--------+--------+--------+--------+ CCA Prox  111     22                               +----------+--------+--------+--------+--------+--------+ CCA Distal67      26                               +----------+--------+--------+--------+--------+--------+ ICA Prox  40      15                               +----------+--------+--------+--------+--------+--------+ ICA Distal70      30                               +----------+--------+--------+--------+--------+--------+ ECA       63      12                                +----------+--------+--------+--------+--------+--------+ +----------+--------+-------+----------------+-------------------+           PSV cm/sEDV cmsDescribe        Arm Pressure (mmHG) +----------+--------+-------+----------------+-------------------+ [id number]             Multiphasic, WNL                    +----------+--------+-------+----------------+-------------------+ +---------+--------+--+--------+--+---------+ VertebralPSV cm/s29EDV cm/s12Antegrade +---------+--------+--+--------+--+---------+  Left Carotid Findings: +----------+--------+--------+--------+--------+------------------+           PSV cm/sEDV cm/sStenosisDescribeComments           +----------+--------+--------+--------+--------+------------------+ CCA Prox  109     17                                         +----------+--------+--------+--------+--------+------------------+ CCA Distal83      27                                         +----------+--------+--------+--------+--------+------------------+ ICA Prox  51      18                      intimal thickening +----------+--------+--------+--------+--------+------------------+ ICA Distal56      25                                         +----------+--------+--------+--------+--------+------------------+ ECA       74      13                                         +----------+--------+--------+--------+--------+------------------+ +----------+--------+--------+----------------+-------------------+ SubclavianPSV cm/sEDV cm/sDescribe        Arm Pressure (mmHG) +----------+--------+--------+----------------+-------------------+           84              Multiphasic, WNL                    +----------+--------+--------+----------------+-------------------+ +---------+--------+--+--------+--+---------+  VertebralPSV cm/s62EDV cm/s23Antegrade +---------+--------+--+--------+--+---------+  Summary: Right Carotid:  Velocities in the right ICA are consistent with a 1-39% stenosis. Left Carotid: Velocities in the left ICA are consistent with a 1-39% stenosis. Vertebrals:  Bilateral vertebral arteries demonstrate antegrade flow. Subclavians: Normal flow hemodynamics were seen in bilateral subclavian              arteries. *See table(s) above for measurements and observations.  Electronically signed by Antony Contras MD on 11/14/2017 at 4:02:16 PM.    Final     Assessment and Plan:   1. Heart block appears Mobitz 1 , brady to 30 -with this he was napping and with CPAP SR, his BB is on hold.  Currently his CPAP is broken so has not been using.  Dr. Margaretann Loveless to see.  Will hold BB until he has CPAP,  Will need 30 day event monitor.  Follow up with Dr. Oval Linsey.   2. NICM back to 2005 due to virus, then improved 2013 to 60% and then 2016 to 30-35% .  No hx of cath in our records. 3. CVA- this admit - has not been on ASA as outpt, antiplatlet per Neuro. 4. OSA -cpap machine is broken- here improved HR with CPAP 5. HTN-188/107 to 141/81 coreg on hold, on po hydralazine 100 mg every 8 hours 6. HLD-- LDL 113 HDL 31, TG 167 on Lipitor 20 - may be beneficial to increase with CVA 7. Chronic systolic HF, on demadex 20 mg BID, home dose and spironolactone  - euvolemic  8. CKD-4 chronic  9. Hypokalemia - will give another 60 meq now and will need BMP next week.        For questions or updates, please contact [place] Please consult [email or web address] for contact info under     Signed, Cecilie Kicks, NP  11/16/2017 2:03 PM  ---------------------------------------------------------------------------------------------   History and all data above reviewed.  Patient examined.  I agree with the findings as above.  Frank Coffey is feeling well and would like to go home.  Constitutional: No acute distress Eyes: pupils equally round and reactive to light, sclera non-icteric, normal conjunctiva and lids ENMT: normal  dentition, moist mucous membranes Cardiovascular: regular rhythm, normal rate, no murmurs. S1 and S2 normal. Radial pulses normal bilaterally. No jugular venous distention.  Respiratory: clear to auscultation bilaterally GI : normal bowel sounds, soft and nontender. No distention.   MSK: extremities warm, well perfused. No edema.  NEURO: grossly nonfocal exam, moves all extremities. PSYCH: alert and oriented x 3, normal mood and affect.   All available labs, radiology testing, previous records reviewed. Agree with documented assessment and plan of my colleague as stated above with the following additions or changes:  Principal Problem:   Mobitz type 1 second degree atrioventricular block Active Problems:   Gout   CKD (chronic kidney disease) stage 4, GFR 15-29 ml/min (HCC)   OSA (obstructive sleep apnea)   NICM (nonischemic cardiomyopathy) ([place])   Morbid obesity due to excess calories (HCC)   TIA (transient ischemic attack)   Hypertensive urgency   Plan:  AV block - His rhythm was Mobitz 1 second-degree AV block with bradycardia while not using his CPAP, however when he put his CPAP on he was in a normal sinus rhythm with a normal rate.  It is very likely that his rhythm disturbance is precipitated by sleep disordered breathing, and we have strongly suggested that he comply with CPAP.  It has been sometime since he has been  seen in clinic and he will need follow-up for sleep apnea to obtain the appropriate machine and recommendations.  Bradycardia - With his bradycardia, we will hold his Coreg until follow-up.  He will need an event monitor to document any pauses, significant symptomatic bradycardia, or high-grade AV block.  Subsequent to the monitor he will be seen in cardiology clinic.  At that time if deemed safe he should be restarted on his heart failure therapy with Coreg.  HTN He did have significant hypertension while in hospital that resolved spontaneously, however he will need  better therapy for his blood pressure.  Currently his blood pressure is minimally elevated and he is on hydralazine 100 mg 3 times daily as well as spironolactone 25 mg daily.  He also takes torsemide 20 mg twice daily.  Further blood pressure control therapy can be pursued as an outpatient, however he does have significant chronic kidney disease.    Elouise Munroe, MD [facility] 12:48 PM  11/18/2017

## 2017-11-16 NOTE — Progress Notes (Signed)
Notified Dr. Sarajane Jews of patients 6 second bradycardia episode with 2nd degree heart block pt asymptomatic. Will continue to monitor.

## 2017-11-16 NOTE — Progress Notes (Signed)
Pt discharge education and instructions completed with pt and spouse at bedside. Both voices understanding and denies any questions. Pt IV and telemetry removed; pt discharge home with spouse to transport him home. Pt to pick up electronically sent prescriptions from preferred pharmacy on file. Pt transported off unit via wheelchair with spouse and belongings to the side. Delia Heady RN

## 2017-11-16 NOTE — Discharge Instructions (Signed)
You will need to wear a monitor for a month, to check the slow heart rate.    Very important to see pulmonary to arrange CPAP

## 2017-11-16 NOTE — Discharge Summary (Addendum)
Physician Discharge Summary  Frank Coffey ZWC:[id number] DOB: 1980/04/16 DOA: 11/14/2017  PCP: Elby Beck, FNP  Admit date: 11/14/2017 Discharge date: 11/16/2017  Recommendations for Outpatient Follow-up:   See recommendations in regard to cardiology follow-up below.  OSA --CPAP.  Follow-up with [facility] pul[facility]ology as an outpatient  Follow-up Information    Rigoberto Noel, MD. Schedule an appointment as soon as possible for a visit in 1 week(s).   Specialty:  Pulmonary Disease Why:  Schedule an appointment for new CPAP.  Contact information: [address] [address] [phone]        Skeet Latch, MD Follow up on 12/19/2017.   Specialty:  Cardiology Why:  at 1:40 pm  Contact information: [address] [place] [address] [phone]        [place] [facility] Follow up on 11/19/2017.   Specialty:  Cardiology Why:  for labs any time after noon on Monday to check potassium Contact information: [address], [place] [phone]       [place] Follow up on 11/28/2017.   Specialty:  Cardiology Why:  at [facility] for a monitor for you to wear.   Contact information: [address], [address]       Elby Beck, FNP. Schedule an appointment as soon as possible for a visit in 1 week(s).   Specialties:  Nurse Practitioner, Family Medicine Contact information: [place] [address] [phone]        Garvin Fila, MD Follow up.   Specialties:  Neurology, Radiology Why:  Office will contact you with an appointment Contact information: [place] [address] [phone]            Discharge Diagnoses:  1. TIA. (principal) 2. Hypertensive emergency 3. Nonischemic cardiomyopathy 4. CKD stage III 5. Hyperlipidemia 6. Anemia of  CKD 7. OSA  Discharge Condition: improved Disposition: home  Diet recommendation: heart healthy  History of present illness:  37 year old man PMH nonischemic cardiomyopathy, essential hypertension, CKD stage IV, reported dizziness, followed by vomiting, followed by left facial numbness and difficulty speaking.  Off blood pressure medications last 3 days.  Not using CPAP for 6 months secondary to broken machine.  Code stroke was called but canceled by neurology.  Noted to have markedly elevated blood pressure on presentation which improved without intervention.  Hospital Course:  Patient initially was unable to tolerate MRI secondary to anxiety, but was subsequently able to have one.  Fortunately no evidence of stroke.  Discussed with neurology.  Diagnosed with TIA with recommendation for aspirin and Plavix for 3 weeks followed by aspirin alone.  I did discuss this with Dr. Leonie Man today.  He was noted to have pauses on telemetry which appeared to be Mobitz 2.  He was seen by cardiology felt to have Mobitz 1 with bradycardia, likely a lot of it related to respiratory issues and his obstructive sleep apnea.  Recommendations were to discontinue carvedilol, supplement potassium, follow-up in the office in 4 weeks and have an event monitor placed which will be arranged by cardiology.  He will need blood work next week and follow-up with his PCP in the short-term.  Also seen by case management, he will need to follow-up with [facility] pul[facility]ology before he is eligible to receive a new BiPAP machine.  Individual issues as below.  TIA.  MRI, CT head no acute findings.   --Aspirin and Plavix for 3 weeks followed by aspirin alone.  Continue statin, outpatient neurology follow-up in 6 weeks  Hypertensive emergency, off medications for 3 days.  Blood pressure improved initially without intervention. Per neurology permissive hypertension 24 hours, treat if SBP greater than 220 or DBP greater than 110 --Blood pressure  stable.  Follow-up as an outpatient.  Nonischemic cardiomyopathy --Appears stable, LVEF appears to be at baseline, presumably hypertensive heart disease, noncompliance. --Has outpatient follow-up with cardiology arranged.  CKD stage III --Appears stable, at baseline  Hyperlipidemia --Continue statin.  Anemia of CKD --Stable.  OSA --CPAP.  Follow-up with [facility] pulmonology as an outpatient  Consultants:  Neurology  Cardiology    Procedures:  Echo Study Conclusions  - Left ventricle: The cavity size was normal. Wall thickness was normal. Systolic function was moderately reduced. The estimated ejection fraction was in the range of 35% to 40%.  Impressions:  - Technically difficult images with poor image quality . The LV functino is moderately reduced.  Today's assessment: S: feels well, no complaints.  No weakness. O: Vitals:  Vitals:   11/16/17 1302 11/16/17 1703  BP: (!) 141/81 (!) 149/106  Pulse: 86 92  Resp: 18 18  Temp: 98.3 F (36.8 C) 98.6 F (37 C)  SpO2: 93% 97%    Constitutional:  . Appears calm and comfortable Respiratory:  . CTA bilaterally, no w/r/r.  . Respiratory effort normal.  Cardiovascular:  . RRR, no m/r/g . Telemetry reviewed multiple pauses noted. . No LE extremity edema   Psychiatric:  . Mental status o Mood, affect appropriate  MRI reviewed  Discharge Instructions  Discharge Instructions    (HEART FAILURE PATIENTS) Call MD:  Anytime you have any of the following symptoms: 1) 3 pound weight gain in 24 hours or 5 pounds in 1 week 2) shortness of breath, with or without a dry hacking cough 3) swelling in the hands, feet or stomach 4) if you have to sleep on extra pillows at night in order to breathe.   Complete by:  As directed    Ambulatory referral to Neurology   Complete by:  As directed    An appointment is requested in approximately: 6 weeks   Diet - low sodium heart healthy   Complete by:  As  directed    Discharge instructions   Complete by:  As directed    Call your physician or seek immediate medical attention for swelling, pain, shortness of breath, weakness, numbness, difficulty speaking or swallowing or worsening of condition.   Heart Failure patients record your daily weight using the same scale at the same time of day   Complete by:  As directed    Increase activity slowly   Complete by:  As directed    Increase activity slowly   Complete by:  As directed      Allergies as of 11/16/2017   No Known Allergies     Medication List    STOP taking these medications   azithromycin 250 MG tablet Commonly known as:  ZITHROMAX   benzonatate 100 MG capsule Commonly known as:  TESSALON   carvedilol 25 MG tablet Commonly known as:  COREG   meloxicam 7.5 MG tablet Commonly known as:  MOBIC     TAKE these medications   allopurinol 100 MG tablet Commonly known as:  ZYLOPRIM Take 100 mg by mouth daily.   allopurinol 300 MG tablet Commonly known as:  ZYLOPRIM Take  300 mg by mouth daily.   aspirin 81 MG tablet Take 1 tablet (81 mg total) by mouth daily.   atorvastatin 20 MG tablet Commonly known as:  LIPITOR Take 1 tablet (20 mg total) by mouth daily.   clopidogrel 75 MG tablet Commonly known as:  PLAVIX Take 1 tablet (75 mg total) by mouth daily. Take total 3 weeks, then stop.   EQ FIBER SUPPLEMENT PO Take 2 tablets by mouth daily.   fluticasone 50 MCG/ACT nasal spray Commonly known as:  FLONASE Place 2 sprays into both nostrils daily. What changed:    when to take this  reasons to take this   hydrALAZINE 100 MG tablet Commonly known as:  APRESOLINE Take 1 tablet (100 mg total) by mouth 3 (three) times daily.   potassium chloride SA 20 MEQ tablet Commonly known as:  K-DUR,KLOR-CON Take 1 tablet (20 mEq total) by mouth daily.   sildenafil 25 MG tablet Commonly known as:  VIAGRA Take 1 tablet (25 mg total) by mouth daily as needed for erectile  dysfunction.   spironolactone 25 MG tablet Commonly known as:  ALDACTONE Take 1 tablet (25 mg total) by mouth daily.   torsemide 20 MG tablet Commonly known as:  DEMADEX Take 1 tablet (20 mg total) by mouth 2 (two) times daily. What changed:    how much to take  when to take this            Durable Medical Equipment  (From admission, onward)         Start     Ordered   11/16/17 1413  For home use only DME continuous positive airway pressure (CPAP)  Once    Question Answer Comment  Patient has OSA or probable OSA Yes   Settings Autotitration   CPAP supplies needed Mask, headgear, cushions, filters, heated tubing and water chamber      11/16/17 1412         No Known Allergies  The results of significant diagnostics from this hospitalization (including imaging, microbiology, ancillary and laboratory) are listed below for reference.    Significant Diagnostic Studies: Dg Chest 2 View  Result Date: 10/29/2017 CLINICAL DATA:  Cold like symptoms since Thursday. EXAM: CHEST - 2 VIEW COMPARISON:  02/03/2017 FINDINGS: Enlarged cardiopericardial silhouette with interstitial edema and bibasilar atelectasis. Uncoiled appearance of the thoracic aorta without definite aneurysm. No pneumothorax, effusion nor acute osseous abnormality. IMPRESSION: Cardiomegaly with slight uncoiling of the thoracic aorta. Mild interstitial edema and bibasilar atelectasis. Electronically Signed   By: Ashley Royalty M.D.   On: 10/29/2017 01:27   Mr Brain Wo Contrast  Result Date: 11/15/2017 CLINICAL DATA:  Difficulty speaking and left facial droop EXAM: MRI HEAD WITHOUT CONTRAST MRA HEAD WITHOUT CONTRAST TECHNIQUE: Multiplanar, multiecho pulse sequences of the brain and surrounding structures were obtained without intravenous contrast. Angiographic images of the head were obtained using MRA technique without contrast. COMPARISON:  Head CT 11/14/2017 FINDINGS: MRI HEAD FINDINGS BRAIN: There is no acute  infarct, acute hemorrhage or mass effect. The midline structures are normal. There are no old infarcts. The white matter signal is normal for the patient's age. The CSF spaces are normal for age, with no hydrocephalus. Single focus of chronic microhemorrhage in the right thalamus. SKULL AND UPPER CERVICAL SPINE: The visualized skull base, calvarium, upper cervical spine and extracranial soft tissues are normal. SINUSES/ORBITS: No fluid levels or advanced mucosal thickening. No mastoid or middle ear effusion. The orbits are normal. MRA HEAD FINDINGS Intracranial  internal carotid arteries: Normal. Anterior cerebral arteries: Normal. Middle cerebral arteries: Normal. Posterior communicating arteries: Present bilaterally. Posterior cerebral arteries: Normal. Basilar artery: Normal. Vertebral arteries: Left dominant. Normal. Superior cerebellar arteries: Normal. Inferior cerebellar arteries: Normal. IMPRESSION: 1. Single focus of chronic microhemorrhage in the right thalamus, possibly due to hypertension. Otherwise normal MRI of the brain. 2. Normal intracranial MRA. Electronically Signed   By: Ulyses Jarred M.D.   On: 11/15/2017 19:31   Mr Jodene Nam Head Wo Contrast  Result Date: 11/15/2017 CLINICAL DATA:  Difficulty speaking and left facial droop EXAM: MRI HEAD WITHOUT CONTRAST MRA HEAD WITHOUT CONTRAST TECHNIQUE: Multiplanar, multiecho pulse sequences of the brain and surrounding structures were obtained without intravenous contrast. Angiographic images of the head were obtained using MRA technique without contrast. COMPARISON:  Head CT 11/14/2017 FINDINGS: MRI HEAD FINDINGS BRAIN: There is no acute infarct, acute hemorrhage or mass effect. The midline structures are normal. There are no old infarcts. The white matter signal is normal for the patient's age. The CSF spaces are normal for age, with no hydrocephalus. Single focus of chronic microhemorrhage in the right thalamus. SKULL AND UPPER CERVICAL SPINE: The  visualized skull base, calvarium, upper cervical spine and extracranial soft tissues are normal. SINUSES/ORBITS: No fluid levels or advanced mucosal thickening. No mastoid or middle ear effusion. The orbits are normal. MRA HEAD FINDINGS Intracranial internal carotid arteries: Normal. Anterior cerebral arteries: Normal. Middle cerebral arteries: Normal. Posterior communicating arteries: Present bilaterally. Posterior cerebral arteries: Normal. Basilar artery: Normal. Vertebral arteries: Left dominant. Normal. Superior cerebellar arteries: Normal. Inferior cerebellar arteries: Normal. IMPRESSION: 1. Single focus of chronic microhemorrhage in the right thalamus, possibly due to hypertension. Otherwise normal MRI of the brain. 2. Normal intracranial MRA. Electronically Signed   By: Ulyses Jarred M.D.   On: 11/15/2017 19:31   Ct Head Code Stroke Wo Contrast  Result Date: 11/14/2017 CLINICAL DATA:  Code stroke. Initial evaluation for acute difficulty speaking, left-sided facial droop. EXAM: CT HEAD WITHOUT CONTRAST TECHNIQUE: Contiguous axial images were obtained from the base of the skull through the vertex without intravenous contrast. COMPARISON:  Prior CT from 02/03/2017. FINDINGS: Brain: Cerebral volume within normal limits for patient age. No evidence for acute intracranial hemorrhage. No findings to suggest acute large vessel territory infarct. No mass lesion, midline shift, or mass effect. Ventricles are normal in size without evidence for hydrocephalus. No extra-axial fluid collection identified. Vascular: No hyperdense vessel identified. Skull: Scalp soft tissues demonstrate no acute abnormality. Calvarium intact. Sinuses/Orbits: Globes and orbital soft tissues demonstrate no acute abnormality. Patient status post scleral banding on the left. Visualized paranasal sinuses are clear. No mastoid effusion. ASPECTS [facility] Stroke Program Early CT Score) - Ganglionic level infarction (caudate, lentiform nuclei,  internal capsule, insula, M1-M3 cortex): 7 - Supraganglionic infarction (M4-M6 cortex): 3 Total score (0-10 with 10 being normal): 10 IMPRESSION: 1. Negative head CT.  No acute intracranial abnormality identified. 2. ASPECTS is 10. These results were communicated to [place] at 1:30 amon 10/30/2019by text page via the [facility] messaging system. Electronically Signed   By: Jeannine Boga M.D.   On: 11/14/2017 01:32   Vas US Carotid (at [place] Only)  Result Date: 11/14/2017 Carotid Arterial Duplex Study Indications:  CVA. Risk Factors: Hypertension, hyperlipidemia. Performing Technologist: Maudry Mayhew MHA, RDMS, RVT, RDCS  Examination Guidelines: A complete evaluation includes B-mode imaging, spectral Doppler, color Doppler, and power Doppler as needed of all accessible portions of each vessel. Bilateral testing is considered an integral part of  a complete examination. Limited examinations for reoccurring indications may be performed as noted.  Right Carotid Findings: +----------+--------+--------+--------+--------+--------+           PSV cm/sEDV cm/sStenosisDescribeComments +----------+--------+--------+--------+--------+--------+ CCA Prox  111     22                               +----------+--------+--------+--------+--------+--------+ CCA Distal67      26                               +----------+--------+--------+--------+--------+--------+ ICA Prox  40      15                               +----------+--------+--------+--------+--------+--------+ ICA Distal70      30                               +----------+--------+--------+--------+--------+--------+ ECA       63      12                               +----------+--------+--------+--------+--------+--------+ +----------+--------+-------+----------------+-------------------+           PSV cm/sEDV cmsDescribe        Arm Pressure (mmHG)  +----------+--------+-------+----------------+-------------------+ [id number]             Multiphasic, WNL                    +----------+--------+-------+----------------+-------------------+ +---------+--------+--+--------+--+---------+ VertebralPSV cm/s29EDV cm/s12Antegrade +---------+--------+--+--------+--+---------+  Left Carotid Findings: +----------+--------+--------+--------+--------+------------------+           PSV cm/sEDV cm/sStenosisDescribeComments           +----------+--------+--------+--------+--------+------------------+ CCA Prox  109     17                                         +----------+--------+--------+--------+--------+------------------+ CCA Distal83      27                                         +----------+--------+--------+--------+--------+------------------+ ICA Prox  51      18                      intimal thickening +----------+--------+--------+--------+--------+------------------+ ICA Distal56      25                                         +----------+--------+--------+--------+--------+------------------+ ECA       74      13                                         +----------+--------+--------+--------+--------+------------------+ +----------+--------+--------+----------------+-------------------+ SubclavianPSV cm/sEDV cm/sDescribe        Arm Pressure (mmHG) +----------+--------+--------+----------------+-------------------+           84  Multiphasic, WNL                    +----------+--------+--------+----------------+-------------------+ +---------+--------+--+--------+--+---------+ VertebralPSV cm/s62EDV cm/s23Antegrade +---------+--------+--+--------+--+---------+  Summary: Right Carotid: Velocities in the right ICA are consistent with a 1-39% stenosis. Left Carotid: Velocities in the left ICA are consistent with a 1-39% stenosis. Vertebrals:  Bilateral vertebral arteries demonstrate  antegrade flow. Subclavians: Normal flow hemodynamics were seen in bilateral subclavian              arteries. *See table(s) above for measurements and observations.  Electronically signed by Antony Contras MD on 11/14/2017 at 4:02:16 PM.    Final    Labs: Basic Metabolic Panel: Recent Labs  Lab 11/14/17 0115 11/14/17 0121 11/14/17 0446  NA 138 140  --   K 2.9* 3.0*  --   CL 98 99  --   CO2 28  --   --   GLUCOSE 115* 110*  --   BUN 34* 38*  --   CREATININE 3.67* 4.10* 3.52*  CALCIUM 9.4  --   --    Liver Function Tests: Recent Labs  Lab 11/14/17 0115  AST 29  ALT 22  ALKPHOS 83  BILITOT 0.7  PROT 7.8  ALBUMIN 3.7   CBC: Recent Labs  Lab 11/14/17 0115 11/14/17 0121 11/14/17 0446  WBC 11.8*  --  9.7  NEUTROABS 7.6  --   --   HGB 12.9* 15.3 11.8*  HCT 44.7 45.0 41.2  MCV 73.0*  --  73.2*  PLT 341  --  321    CBG: Recent Labs  Lab 11/14/17 0113  GLUCAP 107*    Principal Problem:   TIA (transient ischemic attack) Active Problems:   Gout   CKD (chronic kidney disease) stage 4, GFR 15-29 ml/min (HCC)   OSA (obstructive sleep apnea)   NICM (nonischemic cardiomyopathy) ([place])   Morbid obesity due to excess calories ([place])   Hypertensive urgency   Time coordinating discharge: 40 minutes  Signed:  Murray Hodgkins, MD [facility] 11/16/2017, 5:08 PM

## 2017-11-16 NOTE — Care Management Note (Signed)
Case Management Note  Patient Details  Name: Frank Coffey MRN: [id number] Date of Birth: 11-23-1980  Subjective/Objective:                    Action/Plan: Pt discharging home with self care. No f/u per PT/OT and no DME needs.  MD placed order for CPAP. Pt has not been to Pulmonary MD since last year. CM unable to get his sleep study results from the office. He will need to follow up with Dr Elsworth Soho after d/c. Dr Sarajane Jews updated.  Pt has transportation home.   Expected Discharge Date:  11/15/17               Expected Discharge Plan:  Home/Self Care  In-House Referral:     Discharge planning Services  CM Consult  Post Acute Care Choice:    Choice offered to:     DME Arranged:    DME Agency:     HH Arranged:    HH Agency:     Status of Service:  Completed, signed off  If discussed at [employer] of Stay Meetings, dates discussed:    Additional Comments:  Pollie Friar, RN 11/16/2017, 4:16 PM

## 2017-11-16 NOTE — Progress Notes (Addendum)
STROKE TEAM PROGRESS NOTE    INTERVAL HISTORY No family is at the bedside.   [name] He is  feeling much better and is planning on going home.He had MRI scan of the brain showed no definite acute infarct. Small focus of chronic microhemorrhage is noted in the right thalamus from small vessel disease related to hypertension  Vitals:   11/16/17 0052 11/16/17 0435 11/16/17 0820 11/16/17 1302  BP: (!) 159/117 (!) 188/107 (!) 157/127 (!) 141/81  Pulse: 90 97 84 86  Resp:  20  18  Temp:  98.5 F (36.9 C) 97.8 F (36.6 C) 98.3 F (36.8 C)  TempSrc:  Oral Oral Oral  SpO2:  (!) 89% 95% 93%    CBC:  Recent Labs  Lab 11/14/17 0115 11/14/17 0121 11/14/17 0446  WBC 11.8*  --  9.7  NEUTROABS 7.6  --   --   HGB 12.9* 15.3 11.8*  HCT 44.7 45.0 41.2  MCV 73.0*  --  73.2*  PLT 341  --  947    Basic Metabolic Panel:  Recent Labs  Lab 11/14/17 0115 11/14/17 0121 11/14/17 0446  NA 138 140  --   K 2.9* 3.0*  --   CL 98 99  --   CO2 28  --   --   GLUCOSE 115* 110*  --   BUN 34* 38*  --   CREATININE 3.67* 4.10* 3.52*  CALCIUM 9.4  --   --    Lipid Panel:     Component Value Date/Time   CHOL 177 11/14/2017 0446   TRIG 167 (H) 11/14/2017 0446   HDL 31 (L) 11/14/2017 0446   CHOLHDL 5.7 11/14/2017 0446   VLDL 33 11/14/2017 0446   LDLCALC 113 (H) 11/14/2017 0446   HgbA1c:  Lab Results  Component Value Date   HGBA1C 5.8 (H) 11/14/2017   Urine Drug Screen: No results found for: LABOPIA, COCAINSCRNUR, LABBENZ, AMPHETMU, THCU, LABBARB  Alcohol Level No results found for: [facility]  IMAGING Mr Brain Wo Contrast  Result Date: 11/15/2017 CLINICAL DATA:  Difficulty speaking and left facial droop EXAM: MRI HEAD WITHOUT CONTRAST MRA HEAD WITHOUT CONTRAST TECHNIQUE: Multiplanar, multiecho pulse sequences of the brain and surrounding structures were obtained without intravenous contrast. Angiographic images of the head were obtained using MRA technique without contrast. COMPARISON:  Head CT  11/14/2017 FINDINGS: MRI HEAD FINDINGS BRAIN: There is no acute infarct, acute hemorrhage or mass effect. The midline structures are normal. There are no old infarcts. The white matter signal is normal for the patient's age. The CSF spaces are normal for age, with no hydrocephalus. Single focus of chronic microhemorrhage in the right thalamus. SKULL AND UPPER CERVICAL SPINE: The visualized skull base, calvarium, upper cervical spine and extracranial soft tissues are normal. SINUSES/ORBITS: No fluid levels or advanced mucosal thickening. No mastoid or middle ear effusion. The orbits are normal. MRA HEAD FINDINGS Intracranial internal carotid arteries: Normal. Anterior cerebral arteries: Normal. Middle cerebral arteries: Normal. Posterior communicating arteries: Present bilaterally. Posterior cerebral arteries: Normal. Basilar artery: Normal. Vertebral arteries: Left dominant. Normal. Superior cerebellar arteries: Normal. Inferior cerebellar arteries: Normal. IMPRESSION: 1. Single focus of chronic microhemorrhage in the right thalamus, possibly due to hypertension. Otherwise normal MRI of the brain. 2. Normal intracranial MRA. Electronically Signed   By: Ulyses Jarred M.D.   On: 11/15/2017 19:31   Mr Jodene Nam Head Wo Contrast  Result Date: 11/15/2017 CLINICAL DATA:  Difficulty speaking and left facial droop EXAM: MRI HEAD WITHOUT CONTRAST MRA HEAD  WITHOUT CONTRAST TECHNIQUE: Multiplanar, multiecho pulse sequences of the brain and surrounding structures were obtained without intravenous contrast. Angiographic images of the head were obtained using MRA technique without contrast. COMPARISON:  Head CT 11/14/2017 FINDINGS: MRI HEAD FINDINGS BRAIN: There is no acute infarct, acute hemorrhage or mass effect. The midline structures are normal. There are no old infarcts. The white matter signal is normal for the patient's age. The CSF spaces are normal for age, with no hydrocephalus. Single focus of chronic microhemorrhage in  the right thalamus. SKULL AND UPPER CERVICAL SPINE: The visualized skull base, calvarium, upper cervical spine and extracranial soft tissues are normal. SINUSES/ORBITS: No fluid levels or advanced mucosal thickening. No mastoid or middle ear effusion. The orbits are normal. MRA HEAD FINDINGS Intracranial internal carotid arteries: Normal. Anterior cerebral arteries: Normal. Middle cerebral arteries: Normal. Posterior communicating arteries: Present bilaterally. Posterior cerebral arteries: Normal. Basilar artery: Normal. Vertebral arteries: Left dominant. Normal. Superior cerebellar arteries: Normal. Inferior cerebellar arteries: Normal. IMPRESSION: 1. Single focus of chronic microhemorrhage in the right thalamus, possibly due to hypertension. Otherwise normal MRI of the brain. 2. Normal intracranial MRA. Electronically Signed   By: Ulyses Jarred M.D.   On: 11/15/2017 19:31    PHYSICAL EXAM Obese young   male currently not in distress  . Afebrile. Head is nontraumatic. Neck is supple without bruit.    Cardiac exam no murmur or gallop. Lungs are clear to auscultation. Distal pulses are well felt. Neurological Exam ;  Awake  Alert oriented x 3. Normal speech and language.eye movements full without nystagmus.fundi were not visualized. Vision acuity and fields appear normal. Hearing is normal. Palatal movements are normal. Face symmetric. Tongue midline. Normal strength, tone, reflexes and coordination. Normal sensation. Gait deferred.  ASSESSMENT/PLAN Mr. Frank Coffey is a 37 y.o. male with history of CHF, CKD, ocular herpes, migraines, HTN, HLD, obesity and OSA presenting with dysarthria and L facial numbness.   Stroke vs TIA  Code Stroke CT head No acute stroke. ASPECTS 10.     MRI  No acute infarct  MRA  normalCarotid Doppler  pending  2D Echo  Left ventricle: The cavity size was normal. Wall thickness was normal. Systolic function was moderately reduced. The estimated ejection fraction was in  the range of 35% to 40%.  LDL 113  HgbA1c 5.7  Lovenox 40 mg sq daily for VTE prophylaxis  No antithrombotic prior to admission, now on aspirin 325 mg daily. D/c antithrombotic recommendations pending   Therapy recommendations:  pending   Disposition:  pending   Hypertensive Urgency  BP 200/148 on arrival  Off meds x 3 days  BP now Stable . Permissive hypertension (OK if < 220/120) but gradually normalize in 5-7 days . Long-term BP goal normotensive  Hyperlipidemia  Home meds:  lipitor 20, resumed in hospital  Pt taking statin as prescribed  LDL 113, goal < 70  Recommend to increase lipitor dose   Continue statin at discharge  Other Stroke Risk Factors  ETOH use, advised to drink no more than 2 drink(s) a day  Morbid Obesity, There is no height or weight on file to calculate BMI., recommend weight loss, diet and exercise as appropriate   Family hx stroke (maternal grandmother, maternal grandfather)  Migraines  Obstructive sleep apnea, supposed to use CPAP at home but his machine is broken. Needs OP follow up  nonischemic cardiomyopathy   Hx Congestive heart failure  Other Active Problems  CKD stage III  Hospital day # 0   .  He presented with sudden onset of transient left face and arm numbness possibly a right brain subcortical TIA  . Etiology likely small vessel disease with multiple vascular risk factors. Recommend aspirin and Plavix antipaltelet therapy for 3 weeks followed by aspirin. Long discussion with patient about need for aggressive risk factor modification. Stroke  Team will sign off. Follow-up as an outpatient in stroke clinic in 6 weeks. Kindly call for questions.Discussed with Dr. Colbert Ewing, [place] Pager: [phone] 11/16/2017 3:04 PM  To contact Stroke Continuity provider, please refer to [email or web address] After hours, contact General Neurology

## 2017-11-18 DIAGNOSIS — I441 Atrioventricular block, second degree: Secondary | ICD-10-CM

## 2017-11-19 ENCOUNTER — Telehealth: Payer: Self-pay

## 2017-11-19 ENCOUNTER — Other Ambulatory Visit: Payer: Self-pay

## 2017-11-19 NOTE — Telephone Encounter (Signed)
Transition Care Management Follow-up Telephone Call   Date discharged? 11/16/17   How have you been since you were released from the hospital? Doing fine except having a lot of heel/foot pain.  Will discuss with NP at appointment this Wednesday.     Do you understand why you were in the hospital? Yes   Do you understand the discharge instructions? Yes  Where were you discharged to? Home  Items Reviewed:  Medications reviewed: Yes, but states he was added 1 medication that is not on his list, not sure what it is as he is not home but will bring with him on Wednesday and update at appt.   Allergies reviewed: Yes  Dietary changes reviewed: NO changes  Referrals reviewed: Yes, f/u with cardiology and pulmonology wants to discuss with NP on Wednesday.    Functional Questionnaire:   Activities of Daily Living (ADLs):   He states they are independent in the following: all ADL's dressing, bathing, toileting, feeding, ambulation ad lib without assistance.  States they require assistance with the following: does not require any ADL assistance    Any transportation issues/concerns?: NO   Any patient concerns? No, not at this time.    Confirmed importance and date/time of follow-up visits scheduled Yes  Provider Appointment booked with  Tor Netters, NP for 11/21/17 at 8:00am.   Confirmed with patient if condition begins to worsen call PCP or go to the ER.  Patient was given the office number and encouraged to call back with question or concerns.  : Yes

## 2017-11-21 ENCOUNTER — Ambulatory Visit (INDEPENDENT_AMBULATORY_CARE_PROVIDER_SITE_OTHER): Payer: Managed Care, Other (non HMO) | Admitting: Family Medicine

## 2017-11-21 ENCOUNTER — Encounter: Payer: Self-pay | Admitting: Family Medicine

## 2017-11-21 VITALS — BP 132/95 | HR 75 | Temp 98.5°F | Ht 68.5 in | Wt 355.0 lb

## 2017-11-21 DIAGNOSIS — Z23 Encounter for immunization: Secondary | ICD-10-CM | POA: Diagnosis not present

## 2017-11-21 DIAGNOSIS — E876 Hypokalemia: Secondary | ICD-10-CM | POA: Diagnosis not present

## 2017-11-21 DIAGNOSIS — Z09 Encounter for follow-up examination after completed treatment for conditions other than malignant neoplasm: Secondary | ICD-10-CM

## 2017-11-21 DIAGNOSIS — M79671 Pain in right foot: Secondary | ICD-10-CM | POA: Diagnosis not present

## 2017-11-21 DIAGNOSIS — G4733 Obstructive sleep apnea (adult) (pediatric): Secondary | ICD-10-CM

## 2017-11-21 DIAGNOSIS — M79672 Pain in left foot: Secondary | ICD-10-CM

## 2017-11-21 DIAGNOSIS — G459 Transient cerebral ischemic attack, unspecified: Secondary | ICD-10-CM

## 2017-11-21 DIAGNOSIS — N184 Chronic kidney disease, stage 4 (severe): Secondary | ICD-10-CM | POA: Diagnosis not present

## 2017-11-21 NOTE — Patient Instructions (Addendum)
Good to see you today  Bariatric Surgery Information Do one of the following- Attend an in person seminar by calling [phone] to find out schedule Go online for seminar at [email or web address] [facility] will send you a packet)    Stop at lab and see referrals coordinator  Please call for appointment with Dr. Vaughan Browner about counseling  For foot pain, please stretch twice a day, get some insoles for your shoes. You can take acetaminophen (Tylenol), two extra strength every 12 hours.   I will see you next month for your complete physical exam  Plantar Fasciitis Rehab Ask your health care provider which exercises are safe for you. Do exercises exactly as told by your health care provider and adjust them as directed. It is normal to feel mild stretching, pulling, tightness, or discomfort as you do these exercises, but you should stop right away if you feel sudden pain or your pain gets worse. Do not begin these exercises until told by your health care provider. Stretching and range of motion exercises These exercises warm up your muscles and joints and improve the movement and flexibility of your foot. These exercises also help to relieve pain. Exercise A: Plantar fascia stretch  1. Sit with your left / right leg crossed over your opposite knee. 2. Hold your heel with one hand with that thumb near your arch. With your other hand, hold your toes and gently pull them back toward the top of your foot. You should feel a stretch on the bottom of your toes or your foot or both. 3. Hold this stretch for__________ seconds. 4. Slowly release your toes and return to the starting position. Repeat __________ times. Complete this exercise __________ times a day. Exercise B: Gastroc, standing  1. Stand with your hands against a wall. 2. Extend your left / right leg behind you, and bend your front knee slightly. 3. Keeping your heels on the floor and keeping your back knee straight,  shift your weight toward the wall without arching your back. You should feel a gentle stretch in your left / right calf. 4. Hold this position for __________ seconds. Repeat __________ times. Complete this exercise __________ times a day. Exercise C: Soleus, standing 1. Stand with your hands against a wall. 2. Extend your left / right leg behind you, and bend your front knee slightly. 3. Keeping your heels on the floor, bend your back knee and slightly shift your weight over the back leg. You should feel a gentle stretch deep in your calf. 4. Hold this position for __________ seconds. Repeat __________ times. Complete this exercise __________ times a day. Exercise D: Gastrocsoleus, standing 1. Stand with the ball of your left / right foot on a step. The ball of your foot is on the walking surface, right under your toes. 2. Keep your other foot firmly on the same step. 3. Hold onto the wall or a railing for balance. 4. Slowly lift your other foot, allowing your body weight to press your heel down over the edge of the step. You should feel a stretch in your left / right calf. 5. Hold this position for __________ seconds. 6. Return both feet to the step. 7. Repeat this exercise with a slight bend in your left / right knee. Repeat __________ times with your left / right knee straight and __________ times with your left / right knee bent. Complete this exercise __________ times a day. Balance exercise This exercise builds your balance and strength control  of your arch to help take pressure off your plantar fascia. Exercise E: Single leg stand 1. Without shoes, stand near a railing or in a doorway. You may hold onto the railing or door frame as needed. 2. Stand on your left / right foot. Keep your big toe down on the floor and try to keep your arch lifted. Do not let your foot roll inward. 3. Hold this position for __________ seconds. 4. If this exercise is too easy, you can try it with your eyes  closed or while standing on a pillow. Repeat __________ times. Complete this exercise __________ times a day. This information is not intended to replace advice given to you by your health care provider. Make sure you discuss any questions you have with your health care provider. Document Released: 01/02/2005 Document Revised: 09/07/2015 Document Reviewed: 11/16/2014 Elsevier Interactive Patient Education  2018 [employer].

## 2017-11-21 NOTE — Progress Notes (Signed)
Subjective:    Patient ID: Frank Coffey, male    DOB: [birth year], 37 y.o.   MRN: [id number]  HPI This is a 37 yo male who presents today for hospital follow up. He was admitted 11/14/17-11/16/17 for TIA, hypertensive emergency, nonischemic cardiomyopathy, CKD stage III, hyperlipidemia, anemia of CKD, OSA.  Since discharge, he has been doing ok. Was having some upper abdominal pain and left arm pain so he restarted his carvetilol 25 mg and reports those symptoms have improved. Was given mask for his CPAP and has been able to use nightly with improved sleep.   He has follow up appointments with specialists as follows: Cardiology- event monitor placement 11/28/17, follow up 12/19/17 Pulmonary- has not made appointment Neurology- referral made in hospital, ready for [facility]- sees regularly, does not know when his next appointment is.   Pain in heels. Was seen in ER 2 months ago and was given meloxicam which was stopped.   Difficulty with sleeping, increased anxiety/stress. Interested in counseling.   Has struggled with his weight for many years, has had consultation for bariatric surgery in past but did not follow through. He is interested in information today.  Current Meds  Medication Sig  . allopurinol (ZYLOPRIM) 100 MG tablet Take 100 mg by mouth daily.  [name] allopurinol (ZYLOPRIM) 300 MG tablet Take 300 mg by mouth daily.   [name] aspirin 81 MG tablet Take 1 tablet (81 mg total) by mouth daily.  [name] atorvastatin (LIPITOR) 20 MG tablet Take 1 tablet (20 mg total) by mouth daily.  . carvedilol (COREG) 25 MG tablet   . clopidogrel (PLAVIX) 75 MG tablet Take 1 tablet (75 mg total) by mouth daily. Take total 3 weeks, then stop.  [name] FIBER SUPPLEMENT PO Take 2 tablets by mouth daily.   . fluticasone (FLONASE) 50 MCG/ACT nasal spray Place 2 sprays into both nostrils daily. (Patient taking differently: Place 2 sprays into both nostrils daily as needed for allergies. )  . hydrALAZINE  (APRESOLINE) 100 MG tablet Take 1 tablet (100 mg total) by mouth 3 (three) times daily.  . potassium chloride SA (K-DUR,KLOR-CON) 20 MEQ tablet Take 1 tablet (20 mEq total) by mouth daily.  . sildenafil (VIAGRA) 25 MG tablet Take 1 tablet (25 mg total) by mouth daily as needed for erectile dysfunction.  [name] spironolactone (ALDACTONE) 25 MG tablet Take 1 tablet (25 mg total) by mouth daily.  [name] torsemide (DEMADEX) 20 MG tablet Take 1 tablet (20 mg total) by mouth 2 (two) times daily. (Patient taking differently: Take 40 mg by mouth 2 (two) times daily. )     Past Medical History:  Diagnosis Date  . CHF (congestive heart failure) ([place])   . Chronic kidney disease, stage 3, mod decreased GFR (HCC)   . Gout   . 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 Hx of migraine headaches   . Hyperlipidemia   . Hypertension   . Hypertensive heart disease with congestive heart failure and stage 3 kidney disease ([place])   . Obesity   . OSA (obstructive sleep apnea)    Past Surgical History:  Procedure Laterality Date  . CHOLECYSTECTOMY    . RETINAL DETACHMENT REPAIR W/ SCLERAL BUCKLE LE     Left   Family History  Problem Relation Age of Onset  . Hypertension Mother   . Hyperlipidemia Mother   . Heart disease Mother   . Hypertension Maternal Grandmother   . Stroke Maternal Grandmother   . Heart disease Maternal Grandmother   .  Hyperlipidemia Maternal Grandmother   . Stroke Maternal Grandfather   . Hypertension Maternal Grandfather   . Heart disease Maternal Grandfather   . Hyperlipidemia Maternal Grandfather   . Alzheimer's disease Maternal Grandfather   . Cancer - Other Paternal Grandmother   . Healthy Brother        x2  . Healthy Sister        x2  . Healthy Son        x2  . Healthy Daughter        x1  . Diabetes Neg Hx   . Heart attack Neg Hx   . Sudden death Neg Hx    Social History   Tobacco Use  . Smoking status: Never Smoker  . Smokeless tobacco: Never Used  Substance Use Topics  . Alcohol  use: Yes    Comment: socially -- once every few months  . Drug use: No      Review of Systems Per HPI    Objective:   Physical Exam  Constitutional: He is oriented to person, place, and time. He appears well-developed and well-nourished. No distress.  HENT:  Head: Normocephalic and atraumatic.  Eyes: Conjunctivae are normal.  Cardiovascular: Normal rate, regular rhythm and normal heart sounds.  Pulmonary/Chest: Effort normal and breath sounds normal.  Musculoskeletal: He exhibits edema (trace pretibial).  Tightness with foot flexion, mild tenderness of achilles. No palpable defects.   Neurological: He is alert and oriented to person, place, and time. No cranial nerve deficit.  Skin: Skin is warm and dry. He is not diaphoretic.  Psychiatric: He has a normal mood and affect. His behavior is normal. Judgment and thought content normal.  Vitals reviewed.     BP (!) 132/95   Pulse 75   Temp 98.5 F (36.9 C) (Oral)   Ht 5' 8.5" (1.74 m)   Wt (!) 355 lb (161 kg)   SpO2 97%   BMI 53.19 kg/m  Wt Readings from Last 3 Encounters:  11/21/17 (!) 355 lb (161 kg)  08/17/17 (!) 322 lb (146.1 kg)  06/13/17 (!) 342 lb (155.1 kg)   BP Readings from Last 3 Encounters:  11/21/17 (!) 132/95  11/16/17 (!) 149/106  10/29/17 (!) 172/114       Assessment & Plan:  1. Hospital discharge follow-up - Reviewed medications with patient - Appointments made with pulmonary and neurology, he has cardiology set up - follow up in 1 months - I will contact cardiology regarding his carvedilol.   2. Hypokalemia - Basic Metabolic Panel (BMET)  3. Bilateral foot pain - provided written instructions for stretching, can use acetaminophen (reminded him NO NSAIDS), discussed footwear/inserts, weight loss  4. Morbid obesity due to excess calories [facility]) - provided him contact information for [facility] to attend seminar regarding bariatric surgery  5. CKD (chronic kidney disease) stage  4, GFR 15-29 ml/min (HCC) - follow up with nephrology as scheduled  6. OSA (obstructive sleep apnea) - using CPAP, follow up with pulmonary  7. TIA (transient ischemic attack) - no residual, follow up with neurology  8. Need for influenza vaccination - Flu Vaccine QUAD 36+ mos IM   [name], FNP-BC  [facility]  11/22/2017 9:49 AM

## 2017-11-22 ENCOUNTER — Encounter: Payer: Self-pay | Admitting: Family Medicine

## 2017-11-22 LAB — BASIC METABOLIC PANEL
BUN/Creatinine Ratio: 9 (ref 9–20)
BUN: 32 mg/dL — ABNORMAL HIGH (ref 6–20)
CALCIUM: 9.5 mg/dL (ref 8.7–10.2)
CO2: 27 mmol/L (ref 20–29)
Chloride: 98 mmol/L (ref 96–106)
Creatinine, Ser: 3.44 mg/dL — ABNORMAL HIGH (ref 0.76–1.27)
GFR, EST AFRICAN AMERICAN: 25 mL/min/{1.73_m2} — AB (ref >59)
GFR, EST NON AFRICAN AMERICAN: 21 mL/min/{1.73_m2} — AB (ref >59)
Glucose: 74 mg/dL (ref 65–99)
POTASSIUM: 3.5 mmol/L (ref 3.5–5.2)
SODIUM: 142 mmol/L (ref 134–144)

## 2017-11-22 MED ORDER — PERFLUTREN LIPID MICROSPHERE
1.0000 mL | INTRAVENOUS | Status: AC | PRN
  Administered 2017-11-14: 2 mL via INTRAVENOUS

## 2017-11-27 ENCOUNTER — Ambulatory Visit (INDEPENDENT_AMBULATORY_CARE_PROVIDER_SITE_OTHER): Payer: Managed Care, Other (non HMO) | Admitting: Pulmonary Disease

## 2017-11-27 ENCOUNTER — Encounter: Payer: Self-pay | Admitting: Pulmonary Disease

## 2017-11-27 DIAGNOSIS — I160 Hypertensive urgency: Secondary | ICD-10-CM | POA: Diagnosis not present

## 2017-11-27 DIAGNOSIS — G4733 Obstructive sleep apnea (adult) (pediatric): Secondary | ICD-10-CM | POA: Diagnosis not present

## 2017-11-27 NOTE — Assessment & Plan Note (Signed)
Prescription for auto CPAP 5 to 15 cm with nasal pillows will be sent to DME  Call us back if you have not heard from them within a week. CPAP titration study will be scheduled  Weight loss encouraged, compliance with goal of at least 4-6 hrs every night is the expectation. Advised against medications with sedative side effects Cautioned against driving when sleepy - understanding that sleepiness will vary on a day to day basis

## 2017-11-27 NOTE — Progress Notes (Signed)
   Subjective:    Patient ID: Frank Coffey, male    DOB: [birth year], 36 y.o.   MRN: [id number]  HPI 37 year old morbidly obese man for follow-up of OSA He has hypertensive heart disease with CKD stage III and chronic systolic heart failure EF 35%  He was recently hospitalized 10/2017 for TIA.  He was found to have Mobitz type I bradycardia, Coreg was stopped.  His blood pressure was high since he had stopped his medications for 3 days.  Repeat echo showed moderately reduced LVEF -35 to 40%, creatinine was stable no evidence of target organ damage  He has a machine that is more than 39 years old.  For some reason after his sleep study in 2018 he never followed up. When he was in the hospital he was given a full facemask which she was able to use but to panic.  He prefers nasal pillows  Significant tests/ events reviewed  11/2016 HST >> severe OSA, AHI 64/h  Past Medical History:  Diagnosis Date  . CHF (congestive heart failure) ([place])   . Chronic kidney disease, stage 3, mod decreased GFR (HCC)   . Gout   . 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 Hx of migraine headaches   . Hyperlipidemia   . Hypertension   . Hypertensive heart disease with congestive heart failure and stage 3 kidney disease ([place])   . Obesity   . OSA (obstructive sleep apnea)      Review of Systems neg for any significant sore throat, dysphagia, itching, sneezing, nasal congestion or excess/ purulent secretions, fever, chills, sweats, unintended wt loss, pleuritic or exertional cp, hempoptysis, orthopnea pnd or change in chronic leg swelling. Also denies presyncope, palpitations, heartburn, abdominal pain, nausea, vomiting, diarrhea or change in bowel or urinary habits, dysuria,hematuria, rash, arthralgias, visual complaints, headache, numbness weakness or ataxia.     Objective:   Physical Exam  Gen. Pleasant, obese, in no distress ENT - no pallor,icterus, no post nasal drip Neck: No JVD, no thyromegaly, no carotid  bruits Lungs: no use of accessory muscles, no dullness to percussion, decreased without rales or rhonchi  Cardiovascular: Rhythm regular, heart sounds  normal, no murmurs or gallops, no peripheral edema Musculoskeletal: No deformities, no cyanosis or clubbing , no tremors        Assessment & Plan:

## 2017-11-27 NOTE — Assessment & Plan Note (Signed)
Ensured that he has blood pressure medications.  Slight high today but better than what he normally runs. Discussed association of OSA and hypertension

## 2017-11-27 NOTE — Patient Instructions (Signed)
Prescription for auto CPAP 5 to 15 cm with nasal pillows will be sent to DME  Call us back if you have not heard from them within a week. CPAP titration study will be scheduled

## 2017-11-28 ENCOUNTER — Other Ambulatory Visit: Payer: Self-pay | Admitting: Family Medicine

## 2017-11-28 ENCOUNTER — Other Ambulatory Visit: Payer: Self-pay | Admitting: Cardiology

## 2017-11-28 ENCOUNTER — Ambulatory Visit (INDEPENDENT_AMBULATORY_CARE_PROVIDER_SITE_OTHER): Payer: Managed Care, Other (non HMO)

## 2017-11-28 ENCOUNTER — Telehealth: Payer: Self-pay | Admitting: *Deleted

## 2017-11-28 DIAGNOSIS — I441 Atrioventricular block, second degree: Secondary | ICD-10-CM

## 2017-11-28 DIAGNOSIS — M79671 Pain in right foot: Secondary | ICD-10-CM

## 2017-11-28 DIAGNOSIS — M79672 Pain in left foot: Principal | ICD-10-CM

## 2017-11-28 DIAGNOSIS — G459 Transient cerebral ischemic attack, unspecified: Secondary | ICD-10-CM

## 2017-11-28 NOTE — Telephone Encounter (Signed)
Mr. Brendle notified as instructed by telephone.

## 2017-11-28 NOTE — Telephone Encounter (Signed)
Spoke to pt who states he was seen in office 11/6 with c/o foot pain. He completed the exercises given to him at the [facility], but the pain has increased 9/10. He states he was in the ED in July and was given a medication for inflammation. He is unaware of the name, but is wanting Debbie to review his chart and possibly prescribe the same med again, as it was very helpful with the pain. Pls advise

## 2017-11-28 NOTE — Telephone Encounter (Signed)
Frank Coffey notified as instructed by telephone.  He states he is already taking the Tylenol as instructed and it is not helping at all. He has been doing everything that [place] recommeneded.  He states he is in a lot of pain and he can't even put weight on his foot.  He missed work today.  He states he needs something for the pain.  He purchased $123 pair of shoe inserts hoping that would help but nothing is helping .  Please advise.

## 2017-11-28 NOTE — Telephone Encounter (Signed)
Please call patient and tell him that the medication he was given in the ER is bad for his kidneys. He can take 2 extra strength Tylenol twice a day and use over the counter topical medications like Salonpas or Lidocaine. I am happy to put in a referral to the foot specialist.

## 2017-11-28 NOTE — Progress Notes (Signed)
Referral placed to podiatry.

## 2017-11-28 NOTE — Telephone Encounter (Signed)
Please call patient and let him know that I have placed an urgent referral to a foot specialist. Unfortunately, there are not many options for medication with his kidney disease and being on a blood thinner.

## 2017-11-30 ENCOUNTER — Ambulatory Visit (INDEPENDENT_AMBULATORY_CARE_PROVIDER_SITE_OTHER): Payer: Managed Care, Other (non HMO) | Admitting: Podiatry

## 2017-11-30 ENCOUNTER — Ambulatory Visit: Payer: Managed Care, Other (non HMO)

## 2017-11-30 DIAGNOSIS — M779 Enthesopathy, unspecified: Secondary | ICD-10-CM

## 2017-11-30 DIAGNOSIS — M79672 Pain in left foot: Secondary | ICD-10-CM | POA: Diagnosis not present

## 2017-11-30 DIAGNOSIS — G8929 Other chronic pain: Secondary | ICD-10-CM

## 2017-11-30 MED ORDER — DICLOFENAC SODIUM 1 % TD GEL: 2.0000 g | Freq: Four times a day (QID) | TRANSDERMAL | 2 refills | Status: DC

## 2017-11-30 NOTE — Patient Instructions (Signed)

## 2017-12-03 NOTE — Progress Notes (Signed)
Subjective:   Patient ID: Frank Coffey, male   DOB: 37 y.o.   MRN: [id number]   HPI 37 year old male presents the office today for concerns of pain to his left foot which is been ongoing for last week.  States he has pain in the morning he first gets up and gets better with activity.  He has had this issue for him to come to the emergency room was given meloxicam.  He states that on the Achilles tendon.  The pain has become more consistent.  He is starting to do some stretching exercises last week which is helped some.  He states he has had to miss work this past week because of the pain.  No recent injury.  No other concerns.   Review of Systems  All other systems reviewed and are negative.  Past Medical History:  Diagnosis Date  . CHF (congestive heart failure) ([place])   . Chronic kidney disease, stage 3, mod decreased GFR (HCC)   . Gout   . 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 Hx of migraine headaches   . Hyperlipidemia   . Hypertension   . 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 Obesity   . OSA (obstructive sleep apnea)     Past Surgical History:  Procedure Laterality Date  . CHOLECYSTECTOMY    . RETINAL DETACHMENT REPAIR W/ SCLERAL BUCKLE LE     Left     Current Outpatient Medications:  .  allopurinol (ZYLOPRIM) 100 MG tablet, Take 100 mg by mouth daily., Disp: , Rfl:  .  allopurinol (ZYLOPRIM) 300 MG tablet, Take 300 mg by mouth daily. , Disp: , Rfl:  .  aspirin 81 MG tablet, Take 1 tablet (81 mg total) by mouth daily., Disp: , Rfl:  .  atorvastatin (LIPITOR) 20 MG tablet, Take 1 tablet (20 mg total) by mouth daily., Disp: 90 tablet, Rfl: 3 .  carvedilol (COREG) 25 MG tablet, , Disp: , Rfl:  .  clopidogrel (PLAVIX) 75 MG tablet, Take 1 tablet (75 mg total) by mouth daily. Take total 3 weeks, then stop., Disp: 21 tablet, Rfl: 0 .  diclofenac sodium (VOLTAREN) 1 % GEL, Apply 2 g topically 4 (four) times daily. Rub into affected  area of foot 2 to 4 times daily, Disp: 100 g, Rfl: 2 .  EQ FIBER SUPPLEMENT PO, Take 2 tablets by mouth daily. , Disp: , Rfl:  .  fluticasone (FLONASE) 50 MCG/ACT nasal spray, Place 2 sprays into both nostrils daily. (Patient taking differently: Place 2 sprays into both nostrils daily as needed for allergies. ), Disp: 16 g, Rfl: 6 .  hydrALAZINE (APRESOLINE) 100 MG tablet, Take 1 tablet (100 mg total) by mouth 3 (three) times daily., Disp: 90 tablet, Rfl: 0 .  potassium chloride SA (K-DUR,KLOR-CON) 20 MEQ tablet, Take 1 tablet (20 mEq total) by mouth daily., Disp: 30 tablet, Rfl: 0 .  sildenafil (VIAGRA) 25 MG tablet, Take 1 tablet (25 mg total) by mouth daily as needed for erectile dysfunction., Disp: 10 tablet, Rfl: 0 .  spironolactone (ALDACTONE) 25 MG tablet, Take 1 tablet (25 mg total) by mouth daily., Disp: 30 tablet, Rfl: 0 .  torsemide (DEMADEX) 20 MG tablet, Take 1 tablet (20 mg total) by mouth 2 (two) times daily. (Patient taking differently: Take 40 mg by mouth 2 (two) times daily. ), Disp: 60 tablet, Rfl: 0  No Known Allergies       Objective:  Physical Exam  General: AAO x3, NAD, obese  Dermatological: Skin is warm, dry and supple bilateral. Nails x 10 are well manicured; remaining integument appears unremarkable at this time. There are no open sores, no preulcerative lesions, no rash or signs of infection present.  Vascular: Dorsalis Pedis artery and Posterior Tibial artery pedal pulses are 2/4 bilateral with immedate capillary fill time.  There is no pain with calf compression, swelling, warmth, erythema.   Neruologic: Grossly intact via light touch bilateral. Protective threshold with Semmes Wienstein monofilament intact to all pedal sites bilateral.  Negative Tinel sign.  Musculoskeletal: There is tenderness palpation of the distal portion of the Achilles tendon on the insertion into the calcaneus.  Equinus is present there is tenderness and try to dorsiflex the ankle given  the tightness of the Achilles tendon.  There is no defect noted in the Achilles tendon.  No edema, erythema.  No pain on the plantar aspect of the heel and the plantar fascia.  No pain with lateral compression of calcaneus.  No other areas of tenderness.  Muscular strength 5/5 in all groups tested bilateral.  Flatfoot present.  No pain in the right ankle.  Gait: Unassisted, Nonantalgic.    Assessment:   Left Achilles tendinitis     Plan:  -Treatment options discussed including all alternatives, risks, and complications -Etiology of symptoms were discussed -Prescribed Voltaren gel.  Unfortunately to hold off on oral anti-inflammatories given her kidney disease.  I dispensed a heel lift.  Discussed stretching, icing daily.  Dispensed a night splint as well.  If symptoms continue consider short course of steroids.  Also consider physical therapy.  Reviewed the x-ray of the right side from previous.  He has no pain in this area but will monitor.  *x-ray next appointment  Trula Slade DPM

## 2017-12-19 ENCOUNTER — Ambulatory Visit: Payer: Self-pay | Admitting: Cardiovascular Disease

## 2017-12-19 ENCOUNTER — Encounter: Payer: Self-pay | Admitting: Family Medicine

## 2017-12-19 DIAGNOSIS — Z0289 Encounter for other administrative examinations: Secondary | ICD-10-CM

## 2017-12-19 DIAGNOSIS — R0989 Other specified symptoms and signs involving the circulatory and respiratory systems: Secondary | ICD-10-CM

## 2017-12-24 ENCOUNTER — Encounter: Payer: Self-pay | Admitting: *Deleted

## 2018-01-02 ENCOUNTER — Ambulatory Visit: Payer: Managed Care, Other (non HMO) | Admitting: Neurology

## 2018-01-04 ENCOUNTER — Ambulatory Visit: Payer: Self-pay | Admitting: Primary Care

## 2018-01-21 ENCOUNTER — Telehealth: Payer: Self-pay

## 2018-01-21 NOTE — Telephone Encounter (Signed)
Rn call patient that the cardiac event monitor does not show any atrial fibrillation.Pt verbalized understanding. ------

## 2018-01-21 NOTE — Telephone Encounter (Signed)
-----   Message from Garvin Fila, MD sent at 01/20/2018  3:28 PM EST ----- Kindly inform the patient that cardiac monitoring does not show definite evidence of atrial fibrillation.

## 2018-02-12 ENCOUNTER — Telehealth: Payer: Self-pay

## 2018-02-12 ENCOUNTER — Ambulatory Visit: Payer: Managed Care, Other (non HMO) | Admitting: Neurology

## 2018-02-12 NOTE — Telephone Encounter (Signed)
Patient was a no call/no show for their appointment today.   

## 2018-02-13 ENCOUNTER — Encounter: Payer: Self-pay | Admitting: Neurology

## 2018-03-25 ENCOUNTER — Ambulatory Visit: Payer: Managed Care, Other (non HMO) | Admitting: Podiatry

## 2018-05-29 ENCOUNTER — Encounter: Payer: Self-pay | Admitting: Family Medicine

## 2018-05-29 ENCOUNTER — Ambulatory Visit (INDEPENDENT_AMBULATORY_CARE_PROVIDER_SITE_OTHER): Payer: Managed Care, Other (non HMO) | Admitting: Family Medicine

## 2018-05-29 ENCOUNTER — Other Ambulatory Visit (INDEPENDENT_AMBULATORY_CARE_PROVIDER_SITE_OTHER): Payer: Managed Care, Other (non HMO)

## 2018-05-29 ENCOUNTER — Other Ambulatory Visit: Payer: Self-pay | Admitting: Family Medicine

## 2018-05-29 VITALS — BP 164/118 | HR 74 | Temp 98.8°F | Ht 68.5 in | Wt 347.0 lb

## 2018-05-29 DIAGNOSIS — I150 Renovascular hypertension: Secondary | ICD-10-CM | POA: Diagnosis not present

## 2018-05-29 DIAGNOSIS — Z9119 Patient's noncompliance with other medical treatment and regimen: Secondary | ICD-10-CM

## 2018-05-29 DIAGNOSIS — I509 Heart failure, unspecified: Secondary | ICD-10-CM

## 2018-05-29 DIAGNOSIS — M1039 Gout due to renal impairment, multiple sites: Secondary | ICD-10-CM | POA: Diagnosis not present

## 2018-05-29 DIAGNOSIS — G4733 Obstructive sleep apnea (adult) (pediatric): Secondary | ICD-10-CM

## 2018-05-29 DIAGNOSIS — N184 Chronic kidney disease, stage 4 (severe): Secondary | ICD-10-CM | POA: Diagnosis not present

## 2018-05-29 DIAGNOSIS — E785 Hyperlipidemia, unspecified: Secondary | ICD-10-CM

## 2018-05-29 DIAGNOSIS — Z91199 Patient's noncompliance with other medical treatment and regimen due to unspecified reason: Secondary | ICD-10-CM

## 2018-05-29 LAB — CBC WITH DIFFERENTIAL/PLATELET
Basophils Absolute: 0.1 10*3/uL (ref 0.0–0.1)
Basophils Relative: 0.6 % (ref 0.0–3.0)
Eosinophils Absolute: 0.2 10*3/uL (ref 0.0–0.7)
Eosinophils Relative: 2.6 % (ref 0.0–5.0)
HCT: 40.7 % (ref 39.0–52.0)
Hemoglobin: 13 g/dL (ref 13.0–17.0)
Lymphocytes Relative: 12.8 % (ref 12.0–46.0)
Lymphs Abs: 1.1 10*3/uL (ref 0.7–4.0)
MCHC: 32 g/dL (ref 30.0–36.0)
MCV: 70.5 fl — ABNORMAL LOW (ref 78.0–100.0)
Monocytes Absolute: 0.9 10*3/uL (ref 0.1–1.0)
Monocytes Relative: 10.2 % (ref 3.0–12.0)
Neutro Abs: 6.5 10*3/uL (ref 1.4–7.7)
Neutrophils Relative %: 73.8 % (ref 43.0–77.0)
Platelets: 282 10*3/uL (ref 150.0–400.0)
RBC: 5.78 Mil/uL (ref 4.22–5.81)
RDW: 19.3 % — ABNORMAL HIGH (ref 11.5–15.5)
WBC: 8.8 10*3/uL (ref 4.0–10.5)

## 2018-05-29 LAB — COMPREHENSIVE METABOLIC PANEL
ALT: 20 U/L (ref 0–53)
AST: 24 U/L (ref 0–37)
Albumin: 3.6 g/dL (ref 3.5–5.2)
Alkaline Phosphatase: 80 U/L (ref 39–117)
BUN: 27 mg/dL — ABNORMAL HIGH (ref 6–23)
CO2: 31 mEq/L (ref 19–32)
Calcium: 8.7 mg/dL (ref 8.4–10.5)
Chloride: 100 mEq/L (ref 96–112)
Creatinine, Ser: 3.43 mg/dL — ABNORMAL HIGH (ref 0.40–1.50)
GFR: 24.11 mL/min — ABNORMAL LOW (ref >60.00)
Glucose, Bld: 97 mg/dL (ref 70–99)
Potassium: 3.5 mEq/L (ref 3.5–5.1)
Sodium: 140 mEq/L (ref 135–145)
Total Bilirubin: 0.7 mg/dL (ref 0.2–1.2)
Total Protein: 7.3 g/dL (ref 6.0–8.3)

## 2018-05-29 LAB — LIPID PANEL
Cholesterol: 190 mg/dL (ref 0–200)
HDL: 32.8 mg/dL — ABNORMAL LOW (ref >39.00)
LDL Cholesterol: 137 mg/dL — ABNORMAL HIGH (ref 0–99)
NonHDL: 157.68
Total CHOL/HDL Ratio: 6
Triglycerides: 104 mg/dL (ref 0.0–149.0)
VLDL: 20.8 mg/dL (ref 0.0–40.0)

## 2018-05-29 LAB — TSH: TSH: 4 u[IU]/mL (ref 0.35–4.50)

## 2018-05-29 LAB — HEMOGLOBIN A1C: Hgb A1c MFr Bld: 6 % (ref 4.6–6.5)

## 2018-05-29 MED ORDER — ALLOPURINOL 300 MG PO TABS: 300.0000 mg | ORAL_TABLET | Freq: Every day | ORAL | 1 refills | Status: DC

## 2018-05-29 MED ORDER — ATORVASTATIN CALCIUM 20 MG PO TABS: 20.0000 mg | ORAL_TABLET | Freq: Every day | ORAL | 3 refills | Status: DC

## 2018-05-29 NOTE — Progress Notes (Signed)
Lab letter sent 

## 2018-05-29 NOTE — Progress Notes (Signed)
Virtual Visit via Video Note  I connected with Frank Coffey on 05/29/18 at 10:30 AM EDT by a video enabled telemedicine application and verified that I am speaking with the correct person using two identifiers.  Location: Patient: In his home Provider: [place]   I discussed the limitations of evaluation and management by telemedicine and the availability of in person appointments. The patient expressed understanding and agreed to proceed.  History of Present Illness: This is a 38 year old male who requests video visit today to get refills of medications.  He has multiple major medical problems and has not been compliant with follow-up.  He was last seen by me 11/21/2017.  At that time he had elevated blood pressure readings and was instructed to follow-up in 1 month which he did not do.  He reports that he has been working as normal during coronavirus pandemic.  He states his mood has not been very good as he is going through a break-up but expects to feel better soon.  He had his blood pressure taken as well as labs drawn earlier today.  Hypertension- he reports compliance with all of his medications which have been previously prescribed by his nephrologist.  He has not had any follow-up in several months and has no way to check his blood pressure outside of the office due to the girth of his arm.  Last eye exam last year.  Chronic kidney disease stage III- he reports he received a call from his nephrologist that he is due for follow-up.  He does not currently have any follow-up on file.  Gout- he is currently maintained on allopurinol 300 mg and he reports last flare was several months ago.  Obstructive sleep apnea- he had sleep study and titration for CPAP machine but was unable to afford.  He reports now that he would like to contact pulmonary as he is able to afford the equipment.  He reports that he does not sleep well.  Morbid obesity-he was given information for the  bariatric clinic and picked up the packet from them.  He reports that the packet is nearly completed and he still intends to submit it to [facility] surgery.  He reports that he is interested in doing things to improve his health.  ROS- he denies chest pain, shortness of breath, abdominal pain, headache, visual changes.      Past Medical History:  Diagnosis Date  . CHF (congestive heart failure) ([place])   . Chronic kidney disease, stage 3, mod decreased GFR (HCC)   . Gout   . 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 Hx of migraine headaches   . Hyperlipidemia   . Hypertension   . 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 Obesity   . OSA (obstructive sleep apnea)    Past Surgical History:  Procedure Laterality Date  . CHOLECYSTECTOMY    . RETINAL DETACHMENT REPAIR W/ SCLERAL BUCKLE LE     Left   Family History  Problem Relation Age of Onset  . Hypertension Mother   . Hyperlipidemia Mother   . Heart disease Mother   . Hypertension Maternal Grandmother   . Stroke Maternal Grandmother   . Heart disease Maternal Grandmother   . Hyperlipidemia Maternal Grandmother   . Stroke Maternal Grandfather   . Hypertension Maternal Grandfather   . Heart disease Maternal Grandfather   . Hyperlipidemia Maternal Grandfather   . Alzheimer's disease Maternal Grandfather   .  Cancer - Other Paternal Grandmother   . Healthy Brother        x2  . Healthy Sister        x2  . Healthy Son        x2  . Healthy Daughter        x1  . Diabetes Neg Hx   . Heart attack Neg Hx   . Sudden death Neg Hx    Social History   Tobacco Use  . Smoking status: Never Smoker  . Smokeless tobacco: Never Used  Substance Use Topics  . Alcohol use: Yes    Comment: socially -- once every few months  . Drug use: No      Observations/Objective: The patient is alert and answers questions appropriately.  Visible skin is unremarkable.  He is normally conversive  without shortness of breath.  His mood and affect are appropriate.  BP (!) 164/118 Comment: checked it 2 times  Pulse 74   Temp 98.8 F (37.1 C)   Ht 5' 8.5" (1.74 m)   Wt (!) 347 lb (157.4 kg) Comment: per patient  SpO2 96%   BMI 51.99 kg/m  Wt Readings from Last 3 Encounters:  05/29/18 (!) 347 lb (157.4 kg)  11/27/17 (!) 357 lb (161.9 kg)  11/21/17 (!) 355 lb (161 kg)     Assessment and Plan: The following labs were drawn earlier today, CBC with differential, CMP, hemoglobin A1c, lipid panel, TSH  1. CKD (chronic kidney disease) stage 4, GFR 15-29 ml/min (HCC) -Patient requested refill of his antihypertensive medications.  His pharmacy was called to verify doses and last fill date.  It appears that he had his carvedilol, hydralazine, and torsemide filled 05/27/2018 by his nephrologist Dr. Jimmy Footman.  I will notify patient.  Copy of his [facility] that he needs to get further refills of these medications for his nephrologist.  2. Renovascular hypertension -Poor control despite multiple medications.  Have encouraged patient to follow-up with nephrology. -His blood pressure was taken today while he was in his personal vehicle in the parking lot of the clinic.  I am going to have him come back in a couple of weeks for an in office visit. -Return to clinic in 2 weeks for recheck.  3. 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  Dispense: 90 tablet; Refill: 1  4. Morbid obesity due to excess calories [facility]) -He reports that he remains interested in bariatric surgery and has the information and just needs to turn it in.  5. Hyperlipidemia, unspecified hyperlipidemia type -According to the pharmacy is atorvastatin was last filled 08/11/2017.  Lipids not at goal, need to restart atorvastatin. - atorvastatin (LIPITOR) 20 MG tablet; Take 1 tablet (20 mg  total) by mouth daily.  Dispense: 90 tablet; Refill: 3  6. OSA (obstructive sleep apnea) -Patient reports that he was previously unable to afford the equipment, but is able to make payments now.  I have given him the number for the pulmonary clinic so he can call them about contacting the company to get a CPAP machine. -I discussed with him the negative effects his untreated sleep apnea is having on his health including worsening his hypertension and making it more difficult for him to lose weight.  7. History of noncompliance with medical treatment, presenting hazards to health -Discussed with patient the need for closer follow-up and adherence  to follow-up schedule with not only primary care but nephrology, cardiology and pulmonary.  Reminded him of his current poor kidney function and desire to keep him from having to advance to dialysis.  He reports that he is currently motivated to work on his health and agrees to follow-up in 2 weeks.  Over 30 minutes were spent face-to-face with the patient during this encounter and >50% of that time was spent on counseling and coordination of care  [name], FNP-BC  [facility] at [facility], [place]  05/29/2018 5:25 PM    Follow Up Instructions: AVS and copy of labs mailed patient's address on file.   I discussed the assessment and treatment plan with the patient. The patient was provided an opportunity to ask questions and all were answered. The patient agreed with the plan and demonstrated an understanding of the instructions.   The patient was advised to call back or seek an in-person evaluation if the symptoms worsen or if the condition fails to improve as anticipated.   Elby Beck, FNP

## 2018-05-29 NOTE — Patient Instructions (Signed)
Hi Mr. Paulsen,  It was good to see you today for your virtual visit.  I have also included your lab results.  Please read the highlighted information.  I hope you are able to get in touch with the pulmonary clinic regarding your CPAP machine.  I did find where they had ordered your equipment from [employer] [facility] [employer] and their number is [phone] if you need to contact them.  As we discussed, treating your sleep apnea is a very important part of treating your overall health especially your blood pressure and obesity.  Please get your medication refills for your blood pressure medicines through Dr. Deterding's office.  Also, please schedule a follow-up with Dr. Jimmy Footman as soon as possible.  Forward to seeing you in follow-up in 2 weeks.  Please let me know if you have any questions in the meantime.  Warm regards,  Tor Netters, FNP-BC

## 2018-06-06 ENCOUNTER — Ambulatory Visit: Payer: Managed Care, Other (non HMO)

## 2018-06-12 ENCOUNTER — Ambulatory Visit: Payer: Managed Care, Other (non HMO) | Admitting: Family Medicine

## 2018-06-17 ENCOUNTER — Other Ambulatory Visit: Payer: Self-pay | Admitting: Family Medicine

## 2018-06-17 ENCOUNTER — Telehealth: Payer: Self-pay | Admitting: Family Medicine

## 2018-06-17 MED ORDER — TORSEMIDE 20 MG PO TABS: 40.0000 mg | ORAL_TABLET | Freq: Two times a day (BID) | ORAL | 1 refills | Status: DC

## 2018-06-17 NOTE — Telephone Encounter (Signed)
Please call patient's pharmacy and verify dose of torsemide.

## 2018-06-17 NOTE — Telephone Encounter (Signed)
Per pharmacist at [facility] per their record it is Torsemide 20 mg 3 tablets twice daily and was last filled on 05/27/2018 by Dr. Jeneen Rinks Deterding. Spoke with patient also to verify how he is taking it. He is taking 20 mg 2 tablets twice daily. Advised patient what pharmacist told me and patient said the 3 tablets twice daily was temporary adjustment. Sending to provider for review

## 2018-06-17 NOTE — Telephone Encounter (Signed)
Pt called to get refill for torsemide sent to [facility] on [address], [place]. Pt is rescheduled for BP OV 6/3. He is out of med, last dose was 5/29. Feet are swollen just a little.

## 2018-06-17 NOTE — Telephone Encounter (Signed)
Noted. Prescription sent to patient's pharmacy.

## 2018-06-19 ENCOUNTER — Ambulatory Visit (INDEPENDENT_AMBULATORY_CARE_PROVIDER_SITE_OTHER): Payer: Managed Care, Other (non HMO) | Admitting: Family Medicine

## 2018-06-19 ENCOUNTER — Other Ambulatory Visit: Payer: Self-pay

## 2018-06-19 ENCOUNTER — Encounter: Payer: Self-pay | Admitting: Family Medicine

## 2018-06-19 VITALS — BP 142/100 | HR 83 | Temp 98.3°F | Ht 68.5 in | Wt 361.2 lb

## 2018-06-19 DIAGNOSIS — N184 Chronic kidney disease, stage 4 (severe): Secondary | ICD-10-CM

## 2018-06-19 DIAGNOSIS — I150 Renovascular hypertension: Secondary | ICD-10-CM | POA: Diagnosis not present

## 2018-06-19 DIAGNOSIS — G4733 Obstructive sleep apnea (adult) (pediatric): Secondary | ICD-10-CM | POA: Diagnosis not present

## 2018-06-19 NOTE — Progress Notes (Signed)
Subjective:    Patient ID: Frank Coffey, male    DOB: [birth year], 38 y.o.   MRN: [id number]  HPI This is a 38 yo male who presents today for follow up of chronic medical conditions.   Uncontrolled HTN. He had a video visit with labs approximately 3 weeks ago. He has not had recent nephrology follow up for his Stage 3 CKD. He tells me he has an appointment on June 15 with Dr. Jimmy Footman. He was out of his torsemide for several days and just got a dose in yesterday and one just before coming to the office this morning. Has noticed leg swelling which has improved over the last 24 hours.   OSA- discussed with him extensively at last visit the need to treat his OSA. He has seen pulmonary and previously was unable to afford CPAP machine, he is able to do so now, needs contact info for DME company. Number to contact pulmonary was provided as I was unable to determine which company the DME prescription was sent to.   Morbid obesity- he and wife are doing a "fast," where they are eating fruit for 2 meals a day (bananans and oranges) and dinner. He has paperwork for bariatric consult but has not returned it.    He denies chest pain, SOB, headache, visual change.   Past Medical History:  Diagnosis Date  . CHF (congestive heart failure) ([place])   . Chronic kidney disease, stage 3, mod decreased GFR (HCC)   . Gout   . 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 Hx of migraine headaches   . Hyperlipidemia   . Hypertension   . 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 Obesity   . OSA (obstructive sleep apnea)    Past Surgical History:  Procedure Laterality Date  . CHOLECYSTECTOMY    . RETINAL DETACHMENT REPAIR W/ SCLERAL BUCKLE LE     Left   Family History  Problem Relation Age of Onset  . Hypertension Mother   . Hyperlipidemia Mother   . Heart disease Mother   . Hypertension Maternal Grandmother   . Stroke Maternal Grandmother   . Heart disease Maternal  Grandmother   . Hyperlipidemia Maternal Grandmother   . Stroke Maternal Grandfather   . Hypertension Maternal Grandfather   . Heart disease Maternal Grandfather   . Hyperlipidemia Maternal Grandfather   . Alzheimer's disease Maternal Grandfather   . Cancer - Other Paternal Grandmother   . Healthy Brother        x2  . Healthy Sister        x2  . Healthy Son        x2  . Healthy Daughter        x1  . Diabetes Neg Hx   . Heart attack Neg Hx   . Sudden death Neg Hx    Social History   Tobacco Use  . Smoking status: Never Smoker  . Smokeless tobacco: Never Used  Substance Use Topics  . Alcohol use: Yes    Comment: socially -- once every few months  . Drug use: No      Review of Systems Per HPI    Objective:   Physical Exam Vitals signs reviewed.  Constitutional:      General: He is not in acute distress.    Appearance: He is obese. He is not ill-appearing, toxic-appearing or diaphoretic.  HENT:     Head: Normocephalic and atraumatic.  Eyes:  Conjunctiva/sclera: Conjunctivae normal.  Cardiovascular:     Rate and Rhythm: Normal rate and regular rhythm.     Heart sounds: Normal heart sounds.  Pulmonary:     Effort: Pulmonary effort is normal.     Breath sounds: Normal breath sounds.  Musculoskeletal:     Right lower leg: Edema present.     Left lower leg: Edema present.     Comments: Trace pretibial.    Neurological:     Mental Status: He is alert and oriented to person, place, and time.  Psychiatric:        Mood and Affect: Mood normal.        Behavior: Behavior normal.        Thought Content: Thought content normal.        Judgment: Judgment normal.       BP (!) 142/100 (BP Location: Left Arm, Patient Position: Sitting) Comment (Cuff Size): Thigh  Pulse 83   Temp 98.3 F (36.8 C) (Oral)   Ht 5' 8.5" (1.74 m)   Wt (!) 361 lb 4 oz (163.9 kg)   SpO2 93%   BMI 54.13 kg/m  BP Readings from Last 3 Encounters:  06/19/18 (!) 142/100  05/29/18 (!)  164/118  11/27/17 (!) 148/96   Wt Readings from Last 3 Encounters:  06/19/18 (!) 361 lb 4 oz (163.9 kg)  05/29/18 (!) 347 lb (157.4 kg)  11/27/17 (!) 357 lb (161.9 kg)       Assessment & Plan:  1. Renovascular hypertension - improved bp today, still above goal, has been off diuretic for several days - will not make any medication changes today pending follow up with nephrology - discussed importance of BP control to preserve kidney function  2. CKD (chronic kidney disease) stage 4, GFR 15-29 ml/min (HCC) - Follow up with nephrology as scheduled  3. Morbid obesity due to excess calories ([place]) - discussed his current diet with him and encouraged him to eat 4 small meals a day with balanced protein, vegetables/fruits and carbs - encouraged him to follow up with Bariatrics as he is interested in weight loss  4. OSA (obstructive sleep apnea) - provided him with number to pulmonary to call about CPAP. Reminded him of effects of untreated OSA on BP, weight loss attempts  - follow up in 3 months   [name], FNP-BC  [facility] at [facility], [place]  06/19/2018 10:26 AM

## 2018-06-19 NOTE — Patient Instructions (Signed)
Good to see you today  Follow up with the kidney doctor as scheduled  Recommended eating plan-  Eat 4 small meals daily  Eat 10-15 grams of protein with each meal- 2-3 ounces lean meat, 2-3 eggs, 1 cup cottage cheese or [place] yogurt.  Eat 5-6 servings of non starchy vegetables daily (onions, peppers, squash, broccoli, cauliflower, asparagus, lettuce, carrots, etc.) Eat 3-4 servings starch daily- brown rice, quinoa, white or sweet potatoes Eat 3-4 servings lower sugar fruits daily- apple, pear, orange, berries, peach  Avoid packaged food- chips, crackers, sweets  Follow up in 3 weeks

## 2018-07-10 ENCOUNTER — Ambulatory Visit: Payer: Managed Care, Other (non HMO) | Admitting: Family Medicine

## 2018-09-03 ENCOUNTER — Other Ambulatory Visit: Payer: Self-pay

## 2018-09-03 ENCOUNTER — Encounter (HOSPITAL_BASED_OUTPATIENT_CLINIC_OR_DEPARTMENT_OTHER): Payer: Self-pay | Admitting: Emergency Medicine

## 2018-09-03 ENCOUNTER — Emergency Department (HOSPITAL_BASED_OUTPATIENT_CLINIC_OR_DEPARTMENT_OTHER)
Admission: EM | Admit: 2018-09-03 | Discharge: 2018-09-03 | Disposition: A | Discharge status: Home / Self Care | Payer: Managed Care, Other (non HMO) | Attending: Emergency Medicine | Admitting: Emergency Medicine

## 2018-09-03 DIAGNOSIS — E876 Hypokalemia: Secondary | ICD-10-CM

## 2018-09-03 DIAGNOSIS — N184 Chronic kidney disease, stage 4 (severe): Secondary | ICD-10-CM | POA: Diagnosis not present

## 2018-09-03 DIAGNOSIS — R519 Headache, unspecified: Secondary | ICD-10-CM

## 2018-09-03 DIAGNOSIS — Z79899 Other long term (current) drug therapy: Secondary | ICD-10-CM | POA: Diagnosis not present

## 2018-09-03 DIAGNOSIS — R51 Headache: Secondary | ICD-10-CM | POA: Diagnosis present

## 2018-09-03 DIAGNOSIS — Z7982 Long term (current) use of aspirin: Secondary | ICD-10-CM | POA: Diagnosis not present

## 2018-09-03 DIAGNOSIS — I10 Essential (primary) hypertension: Secondary | ICD-10-CM

## 2018-09-03 DIAGNOSIS — I509 Heart failure, unspecified: Secondary | ICD-10-CM | POA: Insufficient documentation

## 2018-09-03 DIAGNOSIS — I130 Hypertensive heart and chronic kidney disease with heart failure and stage 1 through stage 4 chronic kidney disease, or unspecified chronic kidney disease: Secondary | ICD-10-CM | POA: Insufficient documentation

## 2018-09-03 LAB — CBC WITH DIFFERENTIAL/PLATELET
Abs Immature Granulocytes: 0.04 10*3/uL (ref 0.00–0.07)
Basophils Absolute: 0 10*3/uL (ref 0.0–0.1)
Basophils Relative: 0 %
Eosinophils Absolute: 0.2 10*3/uL (ref 0.0–0.5)
Eosinophils Relative: 2 %
HCT: 41 % (ref 39.0–52.0)
Hemoglobin: 12.3 g/dL — ABNORMAL LOW (ref 13.0–17.0)
Immature Granulocytes: 0 %
Lymphocytes Relative: 11 %
Lymphs Abs: 1.1 10*3/uL (ref 0.7–4.0)
MCH: 21.8 pg — ABNORMAL LOW (ref 26.0–34.0)
MCHC: 30 g/dL (ref 30.0–36.0)
MCV: 72.7 fL — ABNORMAL LOW (ref 80.0–100.0)
Monocytes Absolute: 1.2 10*3/uL — ABNORMAL HIGH (ref 0.1–1.0)
Monocytes Relative: 12 %
Neutro Abs: 7.3 10*3/uL (ref 1.7–7.7)
Neutrophils Relative %: 75 %
Platelets: 271 10*3/uL (ref 150–400)
RBC: 5.64 MIL/uL (ref 4.22–5.81)
RDW: 19.3 % — ABNORMAL HIGH (ref 11.5–15.5)
WBC: 9.9 10*3/uL (ref 4.0–10.5)
nRBC: 0 % (ref 0.0–0.2)

## 2018-09-03 LAB — BASIC METABOLIC PANEL WITH GFR
Anion gap: 9 (ref 5–15)
BUN: 33 mg/dL — ABNORMAL HIGH (ref 6–20)
CO2: 28 mmol/L (ref 22–32)
Calcium: 8.3 mg/dL — ABNORMAL LOW (ref 8.9–10.3)
Chloride: 98 mmol/L (ref 98–111)
Creatinine, Ser: 4.13 mg/dL — ABNORMAL HIGH (ref 0.61–1.24)
GFR calc Af Amer: 20 mL/min — ABNORMAL LOW
GFR calc non Af Amer: 17 mL/min — ABNORMAL LOW
Glucose, Bld: 122 mg/dL — ABNORMAL HIGH (ref 70–99)
Potassium: 2.5 mmol/L — CL (ref 3.5–5.1)
Sodium: 135 mmol/L (ref 135–145)

## 2018-09-03 MED ORDER — DIPHENHYDRAMINE HCL 50 MG/ML IJ SOLN
25.0000 mg | Freq: Once | INTRAMUSCULAR | Status: AC
  Administered 2018-09-03: 25 mg via INTRAVENOUS
  Filled 2018-09-03: qty 1

## 2018-09-03 MED ORDER — METOCLOPRAMIDE HCL 5 MG/ML IJ SOLN
5.0000 mg | Freq: Once | INTRAMUSCULAR | Status: AC
  Administered 2018-09-03: 5 mg via INTRAVENOUS
  Filled 2018-09-03: qty 2

## 2018-09-03 MED ORDER — POTASSIUM CHLORIDE CRYS ER 20 MEQ PO TBCR: 20.0000 meq | EXTENDED_RELEASE_TABLET | Freq: Two times a day (BID) | ORAL | 0 refills | Status: DC

## 2018-09-03 MED ORDER — HYDRALAZINE HCL 20 MG/ML IJ SOLN
5.0000 mg | Freq: Once | INTRAMUSCULAR | Status: AC
  Administered 2018-09-03: 5 mg via INTRAVENOUS
  Filled 2018-09-03: qty 1

## 2018-09-03 MED ORDER — POTASSIUM CHLORIDE CRYS ER 20 MEQ PO TBCR
40.0000 meq | EXTENDED_RELEASE_TABLET | Freq: Once | ORAL | Status: AC
  Administered 2018-09-03: 40 meq via ORAL
  Filled 2018-09-03: qty 2

## 2018-09-03 NOTE — Discharge Instructions (Signed)
You were seen today for headache.  This may be related to your blood pressure.  It is very important that you take your blood pressure medications daily as prescribed.  You were noted to have low potassium.  Take potassium supplementation as prescribed.  Follow-up with your primary doctor for recheck of your blood pressure, potassium, and kidney function.

## 2018-09-03 NOTE — ED Provider Notes (Signed)
[facility] EMERGENCY DEPARTMENT Provider Note   CSN: [id number] Arrival date & time: 09/03/18  0103     History   Chief Complaint Chief Complaint  Patient presents with  . Headache    Frank Coffey is a 38 y.o. male.     Frank  This is a 38 year old male with a history of CHF, chronic kidney disease, migraines, hypertension, hyperlipidemia, obesity who presents with headache.  Patient reports 2 to 3-day history of worsening left-sided headache.  He states that it is behind his left eye.  Currently he rates his pain 8 out of 10.  He took over the counter medications without relief.  Patient notably hypertensive.  He reports that he forgot to take his blood pressure medication today.  He denies any vision changes, focal weakness, numbness, tingling.  He denies any recent fevers or neck stiffness.  Past Medical History:  Diagnosis Date  . CHF (congestive heart failure) ([place])   . Chronic kidney disease, stage 3, mod decreased GFR (HCC)   . Gout   . 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 Hx of migraine headaches   . Hyperlipidemia   . Hypertension   . Hypertensive heart disease with congestive heart failure and stage 3 kidney disease ([place])   . Obesity   . OSA (obstructive sleep apnea)     Patient Active Problem List   Diagnosis Date Noted  . Mobitz type 1 second degree atrioventricular block 11/18/2017  . TIA (transient ischemic attack) 11/14/2017  . Hypertensive urgency 11/14/2017  . Migraine 10/02/2016  . Erectile dysfunction 10/02/2016  . Annual physical exam 02/22/2016  . Onychomycosis 02/22/2016  . Pre-operative cardiovascular examination 07/19/2015  . NICM (nonischemic cardiomyopathy) ([place]) 07/19/2015  . Morbid obesity due to excess calories ([place]) 07/19/2015  . 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 CHF (congestive heart failure) ([place]) 12/19/2012  . Benign hypertension with chronic kidney disease, stage IV ([place]) 12/19/2012  . Gout 12/19/2012  . CKD  (chronic kidney disease) stage 4, GFR 15-29 ml/min (HCC) 12/19/2012  . OSA (obstructive sleep apnea) 12/19/2012    Past Surgical History:  Procedure Laterality Date  . CHOLECYSTECTOMY    . RETINAL DETACHMENT REPAIR W/ SCLERAL BUCKLE LE     Left        Home Medications    Prior to Admission medications   Medication Sig Start Date End Date Taking? Authorizing Provider  allopurinol (ZYLOPRIM) 300 MG tablet Take 1 tablet (300 mg total) by mouth daily. 05/29/18   Elby Beck, FNP  aspirin 81 MG tablet Take 1 tablet (81 mg total) by mouth daily. 11/16/17   Samuella Cota, MD  atorvastatin (LIPITOR) 20 MG tablet Take 1 tablet (20 mg total) by mouth daily. 05/29/18   Elby Beck, FNP  carvedilol (COREG) 25 MG tablet Take 25 mg by mouth 2 (two) times daily with a meal.  11/20/17   [provider]  diclofenac sodium (VOLTAREN) 1 % GEL Apply 2 g topically 4 (four) times daily. Rub into affected area of foot 2 to 4 times daily Patient taking differently: Apply 2 g topically 4 (four) times daily. As needed Rub into affected area of foot 2 to 4 times daily 11/30/17   Trula Slade, DPM  EQ FIBER SUPPLEMENT PO Take 2 tablets by mouth daily.     [provider]  fluticasone (FLONASE) 50 MCG/ACT nasal spray Place 2 sprays into both nostrils daily. Patient taking differently: Place 2 sprays into both nostrils  daily as needed for allergies.  10/03/16   Brunetta Jeans, PA-C  hydrALAZINE (APRESOLINE) 100 MG tablet Take 1 tablet (100 mg total) by mouth 3 (three) times daily. 11/16/17   Samuella Cota, MD  potassium chloride SA (K-DUR) 20 MEQ tablet Take 1 tablet (20 mEq total) by mouth 2 (two) times daily. 09/03/18   Malahki Gasaway, Barbette Hair, MD  potassium chloride SA (K-DUR,KLOR-CON) 20 MEQ tablet Take 1 tablet (20 mEq total) by mouth daily. 11/16/17   Samuella Cota, MD  sildenafil (VIAGRA) 25 MG tablet Take 1 tablet (25 mg total) by mouth daily as needed for erectile  dysfunction. 10/02/16   Brunetta Jeans, PA-C  spironolactone (ALDACTONE) 25 MG tablet Take 25 mg by mouth 2 (two) times daily.    [provider]  torsemide (DEMADEX) 20 MG tablet Take 2 tablets (40 mg total) by mouth 2 (two) times daily. 06/17/18   Elby Beck, FNP    Family History Family History  Problem Relation Age of Onset  . Hypertension Mother   . Hyperlipidemia Mother   . Heart disease Mother   . Hypertension Maternal Grandmother   . Stroke Maternal Grandmother   . Heart disease Maternal Grandmother   . Hyperlipidemia Maternal Grandmother   . Stroke Maternal Grandfather   . Hypertension Maternal Grandfather   . Heart disease Maternal Grandfather   . Hyperlipidemia Maternal Grandfather   . Alzheimer's disease Maternal Grandfather   . Cancer - Other Paternal Grandmother   . Healthy Brother        x2  . Healthy Sister        x2  . Healthy Son        x2  . Healthy Daughter        x1  . Diabetes Neg Hx   . Heart attack Neg Hx   . Sudden death Neg Hx     Social History Social History   Tobacco Use  . Smoking status: Never Smoker  . Smokeless tobacco: Never Used  Substance Use Topics  . Alcohol use: Yes    Comment: socially -- once every few months  . Drug use: No     Allergies   Patient has no known allergies.   Review of Systems Review of Systems  Constitutional: Negative for fever.  Eyes: Negative for photophobia and visual disturbance.  Respiratory: Negative for shortness of breath.   Cardiovascular: Negative for chest pain.  Gastrointestinal: Negative for abdominal pain, nausea and vomiting.  Genitourinary: Negative for dysuria.  Neurological: Positive for headaches.  All other systems reviewed and are negative.    Physical Exam Updated Vital Signs BP (!) 180/93   Pulse 88   Temp 99.1 F (37.3 C) (Oral)   Resp 20   Ht 1.727 m (5\' 8" )   Wt (!) 159.7 kg   SpO2 96%   BMI 53.52 kg/m   Physical Exam Vitals signs and  nursing note reviewed.  Constitutional:      Appearance: He is well-developed. He is obese. He is not ill-appearing.  HENT:     Head: Normocephalic and atraumatic.  Eyes:     Pupils: Pupils are equal, round, and reactive to light.     Comments: Pupils 2 mm reactive bilaterally  Neck:     Musculoskeletal: Normal range of motion and neck supple.  Cardiovascular:     Rate and Rhythm: Normal rate and regular rhythm.     Heart sounds: Normal heart sounds. No murmur.  Pulmonary:  Effort: Pulmonary effort is normal. No respiratory distress.     Breath sounds: Normal breath sounds. No wheezing.  Abdominal:     General: Bowel sounds are normal.     Palpations: Abdomen is soft.     Tenderness: There is no abdominal tenderness. There is no rebound.  Lymphadenopathy:     Cervical: No cervical adenopathy.  Skin:    General: Skin is warm and dry.  Neurological:     Mental Status: He is alert and oriented to person, place, and time.     Comments: Cranial nerves II through XII intact, 5 out of 5 strength in all 4 extremities, no dysmetria to finger-nose-finger  Psychiatric:        Mood and Affect: Mood normal.      ED Treatments / Results  Labs (all labs ordered are listed, but only abnormal results are displayed) Labs Reviewed  CBC WITH DIFFERENTIAL/PLATELET - Abnormal; Notable for the following components:      Result Value   Hemoglobin 12.3 (*)    MCV 72.7 (*)    MCH 21.8 (*)    RDW 19.3 (*)    Monocytes Absolute 1.2 (*)    All other components within normal limits  BASIC METABOLIC PANEL - Abnormal; Notable for the following components:   Potassium 2.5 (*)    Glucose, Bld 122 (*)    BUN 33 (*)    Creatinine, Ser 4.13 (*)    Calcium 8.3 (*)    GFR calc non Af Amer 17 (*)    GFR calc Af Amer 20 (*)    All other components within normal limits    EKG None  Radiology No results found.  Procedures Procedures (including critical care time)  Medications Ordered in ED  Medications  metoCLOPramide (REGLAN) injection 5 mg (5 mg Intravenous Given 09/03/18 0220)  diphenhydrAMINE (BENADRYL) injection 25 mg (25 mg Intravenous Given 09/03/18 0216)  hydrALAZINE (APRESOLINE) injection 5 mg (5 mg Intravenous Given 09/03/18 0221)  potassium chloride SA (K-DUR) CR tablet 40 mEq (40 mEq Oral Given 09/03/18 0319)     Initial Impression / Assessment and Plan / ED Course  I have reviewed the triage vital signs and the nursing notes.  Pertinent labs & imaging results that were available during my care of the patient were reviewed by me and considered in my medical decision making (see chart for details).        Patient presents with headache.  He is overall nontoxic-appearing vital signs are notable for systolic blood pressure greater than 200.  He is nonfocal on exam.  He does have a history of migraines; however, blood pressure could also be contributing to headache.  Patient was given Reglan, Benadryl, and hydralazine.  Because of his blood pressure, basic lab work was obtained.  Lab work notable for hypokalemia as well as slightly worsened kidney function.  Baseline creatinine mid threes.  Creatinine today 4.  On recheck his blood pressure has down trended into the 180s over 90s range.  He states he feels much better.  He remains nontoxic and nonfocal.  We discussed compliance with his blood pressure medication.  At this time he does not appear to have any hypertensive urgency or emergency.  However, blood pressure may be contributing to his headache as well as his worsening kidney function.  He was replaced potassium and will be discharged with a course of potassium.  He needs to follow-up closely with his primary doctor for recheck of blood pressure, potassium,  and kidney function.  Patient stated understanding.  After history, exam, and medical workup I feel the patient has been appropriately medically screened and is safe for discharge home. Pertinent diagnoses were  discussed with the patient. Patient was given return precautions.   Final Clinical Impressions(s) / ED Diagnoses   Final diagnoses:  Bad headache  Essential hypertension  Hypokalemia    ED Discharge Orders         Ordered    potassium chloride SA (K-DUR) 20 MEQ tablet  2 times daily     09/03/18 0322           Emmilee Reamer, Barbette Hair, MD 09/03/18 [phone]

## 2018-09-03 NOTE — ED Triage Notes (Signed)
Pt is c/o headache x 3 days  Pt has used OTC medication without relief  Pt says the pain is on the left side of his head

## 2018-09-11 ENCOUNTER — Other Ambulatory Visit: Payer: Self-pay | Admitting: Family Medicine

## 2018-09-13 NOTE — Telephone Encounter (Signed)
Torsemide Last filled:  08/01/18, #120 Last OV:  06/19/18, f/u Next OV:  09/18/18, 3 mo f/u

## 2018-09-18 ENCOUNTER — Ambulatory Visit (INDEPENDENT_AMBULATORY_CARE_PROVIDER_SITE_OTHER): Payer: Managed Care, Other (non HMO) | Admitting: Family Medicine

## 2018-09-18 ENCOUNTER — Other Ambulatory Visit: Payer: Self-pay

## 2018-09-18 ENCOUNTER — Encounter: Payer: Self-pay | Admitting: Family Medicine

## 2018-09-18 VITALS — BP 146/98 | HR 88 | Temp 98.8°F | Ht 69.0 in | Wt 363.8 lb

## 2018-09-18 DIAGNOSIS — I428 Other cardiomyopathies: Secondary | ICD-10-CM

## 2018-09-18 DIAGNOSIS — N184 Chronic kidney disease, stage 4 (severe): Secondary | ICD-10-CM

## 2018-09-18 DIAGNOSIS — G459 Transient cerebral ischemic attack, unspecified: Secondary | ICD-10-CM

## 2018-09-18 DIAGNOSIS — G43009 Migraine without aura, not intractable, without status migrainosus: Secondary | ICD-10-CM | POA: Diagnosis not present

## 2018-09-18 DIAGNOSIS — F329 Major depressive disorder, single episode, unspecified: Secondary | ICD-10-CM

## 2018-09-18 MED ORDER — TRAMADOL HCL 50 MG PO TABS: 50.0000 mg | ORAL_TABLET | Freq: Two times a day (BID) | ORAL | 0 refills | Status: AC | PRN

## 2018-09-18 NOTE — Progress Notes (Signed)
Subjective:    Patient ID: Frank Coffey, male    DOB: 1980-04-03, 38 y.o.   MRN: [id number]  HPI Chief Complaint  Patient presents with  . Follow-up    Pt seen in hospital x 2 weeks ago for migraine - thought to be BP related. Pt has severe pain 8/10 behind left eye, seeing floaters. Pt has been taking Tylenol and resting. Denies N/V, distorted vision or photophobia.   This is a 38 yo male who presents today for above cc and routine 3 month follow up.  Headache- behind left eye, seeing floaters, was seen in ED with improvement but not resolution. Taking Tylenol with some improvement of headache. No photo/phonophobia. No recent eye exam.   HTN/ CKD- He reports compliance with blood pressure medications. Thinks that BP falsely elevated from using too small cuff. Had appointment with Dr. Donette Larry this am, felt that he needed to deal with his headache first. Will reschedule.   Depression- his mother died 108 months ago, he is feeling down, wanting to sleep more, decreased energy. He started back walking this week and noticed improved mood and sleep. Spoke with EAP at work but didn't find this helpful. No interested in talking with anyone else presently.   OSA- doing well with new machine Past Medical History:  Diagnosis Date  . CHF (congestive heart failure) ([place])   . Chronic kidney disease, stage 3, mod decreased GFR (HCC)   . Gout   . 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 Hx of migraine headaches   . Hyperlipidemia   . Hypertension   . Hypertensive heart disease with congestive heart failure and stage 3 kidney disease ([place])   . Obesity   . OSA (obstructive sleep apnea)    Past Surgical History:  Procedure Laterality Date  . CHOLECYSTECTOMY    . RETINAL DETACHMENT REPAIR W/ SCLERAL BUCKLE LE     Left   Family History  Problem Relation Age of Onset  . Hypertension Mother   . Hyperlipidemia Mother   . Heart disease Mother   . Hypertension Maternal Grandmother   . Stroke Maternal  Grandmother   . Heart disease Maternal Grandmother   . Hyperlipidemia Maternal Grandmother   . Stroke Maternal Grandfather   . Hypertension Maternal Grandfather   . Heart disease Maternal Grandfather   . Hyperlipidemia Maternal Grandfather   . Alzheimer's disease Maternal Grandfather   . Cancer - Other Paternal Grandmother   . Healthy Brother        x2  . Healthy Sister        x2  . Healthy Son        x2  . Healthy Daughter        x1  . Diabetes Neg Hx   . Heart attack Neg Hx   . Sudden death Neg Hx    Social History   Tobacco Use  . Smoking status: Never Smoker  . Smokeless tobacco: Never Used  Substance Use Topics  . Alcohol use: Yes    Comment: socially -- once every few months  . Drug use: No      Review of Systems    per HPI  Objective:   Physical Exam Vitals signs reviewed.  Constitutional:      General: He is not in acute distress.    Appearance: He is obese. He is ill-appearing (mildly). He is not toxic-appearing or diaphoretic.  HENT:     Head: Normocephalic and atraumatic.  Eyes:     Conjunctiva/sclera:  Conjunctivae normal.  Cardiovascular:     Rate and Rhythm: Normal rate.     Heart sounds: Normal heart sounds.  Pulmonary:     Effort: Pulmonary effort is normal.     Breath sounds: Normal breath sounds.  Neurological:     Mental Status: He is alert and oriented to person, place, and time.  Psychiatric:        Mood and Affect: Mood is depressed.        Behavior: Behavior normal.        Thought Content: Thought content normal.        Judgment: Judgment normal.       BP (!) 180/114 (BP Location: Left Arm, Patient Position: Sitting, Cuff Size: Large)   Pulse 88   Temp 98.8 F (37.1 C) (Temporal)   Ht 5\' 9"  (1.753 m)   Wt (!) 363 lb 12.8 oz (165 kg)   SpO2 96%   BMI 53.72 kg/m  Wt Readings from Last 3 Encounters:  09/18/18 (!) 363 lb 12.8 oz (165 kg)  09/03/18 (!) 352 lb (159.7 kg)  06/19/18 (!) 361 lb 4 oz (163.9 kg)   BP: (!)  146/98    BP Readings from Last 3 Encounters:  09/18/18 (!) 146/98  09/03/18 (!) 161/95  06/19/18 (!) 142/100       Assessment & Plan:  1. Migraine without aura and without status migrainosus, not intractable - Unable to use most migraine treatments due to PMH, will give small amount tramadol and encouraged him to use Tylenol 2 tablets every 6-8 hours - if unable to resolve, will send to neurology, he is to let me know  - traMADol (ULTRAM) 50 MG tablet; Take 1 tablet (50 mg total) by mouth every 12 (twelve) hours as needed for up to 5 days.  Dispense: 10 tablet; Refill: 0  2. CKD (chronic kidney disease) stage 4, GFR 15-29 ml/min (HCC) - Needs follow up with nephrology, he will call and reschedule - overdue eye exam, he agrees with referral - Ambulatory referral to Ophthalmology  3. NICM (nonischemic cardiomyopathy) ([place]) - Ambulatory referral to Ophthalmology  4. TIA (transient ischemic attack) - Ambulatory referral to Ophthalmology  5. Reactive depression - discussed therapy, medication, non pharmacologic interventions. Provided written and verbal information.   - follow up in 8-12 weeks for CPE   [name], FNP-BC  [facility], [place]  09/18/2018 10:25 AM

## 2018-09-18 NOTE — Progress Notes (Signed)
Subjective:    Patient ID: Frank Coffey, male    DOB: 12/02/1980, 38 y.o.   MRN: [id number]  Chief Complaint  Patient presents with  . Follow-up    Pt seen in hospital x 2 weeks ago for migraine - thought to be BP related. Pt has severe pain 8/10 behind left eye, seeing floaters. Pt has been taking Tylenol and resting. Denies N/V, distorted vision or photophobia.    HPI    Patient 38 y.o. arrives to the office for an acute visit due to ongoing migraines. Migraine has started 3 weeks ago. Pt went to  ED 2 weeks ago for migraine management. Pt reports ER treatment did not help but make him more sleepy for a while. Patient states his pain is 8/10. He takes tylenol twice a day and that helps to reduce pain to 2/10. Patient denies any nausea, vomiting or photophobia but reports some floaters in his left eye with increased pain. Patient had left eye surgery 2-3 years ago due retinal detachment. He has not been following with his ophthalmologist for the past several years.  Patient reports feeling depressed, but refuses medication at this time. Pt states he wakes up multiple time during the night. He reports that his mood improves when he exercises. He continues to work from home.   Patient supposed to see his nephrologist today but instead came to the office for the acute issue. Patient is encouraged to rescheule appointment as soon as possible.  Past Medical History:  Diagnosis Date  . CHF (congestive heart failure) ([place])   . Chronic kidney disease, stage 3, mod decreased GFR (HCC)   . Gout   . 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 Hx of migraine headaches   . Hyperlipidemia   . Hypertension   . Hypertensive heart disease with congestive heart failure and stage 3 kidney disease ([place])   . Obesity   . OSA (obstructive sleep apnea)     Past Surgical History:  Procedure Laterality Date  . CHOLECYSTECTOMY    . RETINAL DETACHMENT REPAIR W/ SCLERAL BUCKLE LE     Left   Family History  Problem  Relation Age of Onset  . Hypertension Mother   . Hyperlipidemia Mother   . Heart disease Mother   . Hypertension Maternal Grandmother   . Stroke Maternal Grandmother   . Heart disease Maternal Grandmother   . Hyperlipidemia Maternal Grandmother   . Stroke Maternal Grandfather   . Hypertension Maternal Grandfather   . Heart disease Maternal Grandfather   . Hyperlipidemia Maternal Grandfather   . Alzheimer's disease Maternal Grandfather   . Cancer - Other Paternal Grandmother   . Healthy Brother        x2  . Healthy Sister        x2  . Healthy Son        x2  . Healthy Daughter        x1  . Diabetes Neg Hx   . Heart attack Neg Hx   . Sudden death Neg Hx    Social History   Socioeconomic History  . Marital status: Divorced    Spouse name: Not on file  . Number of children: Not on file  . Years of education: Not on file  . Highest education level: Not on file  Occupational History  . Not on file  Social Needs  . Financial resource strain: Not on file  . Food insecurity    Worry: Not on file  Inability: Not on file  . Transportation needs    Medical: Not on file    Non-medical: Not on file  Tobacco Use  . Smoking status: Never Smoker  . Smokeless tobacco: Never Used  Substance and Sexual Activity  . Alcohol use: Yes    Comment: socially -- once every few months  . Drug use: No  . Sexual activity: Yes    Partners: Female  Lifestyle  . Physical activity    Days per week: Not on file    Minutes per session: Not on file  . Stress: Not on file  Relationships  . Social Herbalist on phone: Not on file    Gets together: Not on file    Attends religious service: Not on file    Active member of club or organization: Not on file    Attends meetings of clubs or organizations: Not on file    Relationship status: Not on file  . Intimate partner violence    Fear of current or ex partner: Not on file    Emotionally abused: Not on file    Physically abused:  Not on file    Forced sexual activity: Not on file  Other Topics Concern  . Not on file  Social History Narrative   Epworth Sleepiness Scale = 10 (as of 12/01/14)     Review of Systems  Constitutional: Negative for activity change and appetite change.  Eyes: Positive for pain and visual disturbance. Negative for photophobia, discharge, redness and itching.       Left eye  Respiratory: Negative for cough, chest tightness, shortness of breath and wheezing.   Cardiovascular: Negative for chest pain and palpitations.  Gastrointestinal: Negative for abdominal distention, abdominal pain, nausea and vomiting.  Musculoskeletal: Negative.   Neurological: Positive for headaches. Negative for dizziness, syncope, facial asymmetry, weakness and light-headedness.  Psychiatric/Behavioral: Positive for sleep disturbance.       Objective:   Physical Exam Constitutional:      Appearance: Normal appearance. He is obese.  HENT:     Head: Normocephalic and atraumatic.     Nose: Nose normal.     Mouth/Throat:     Mouth: Mucous membranes are moist.  Eyes:     General: No scleral icterus.       Right eye: No discharge.        Left eye: No discharge.     Extraocular Movements: Extraocular movements intact.     Pupils: Pupils are equal, round, and reactive to light.  Neck:     Musculoskeletal: Normal range of motion and neck supple.  Cardiovascular:     Rate and Rhythm: Normal rate and regular rhythm.     Pulses: Normal pulses.     Heart sounds: Normal heart sounds. No murmur. No friction rub. No gallop.   Pulmonary:     Effort: Pulmonary effort is normal. No respiratory distress.     Breath sounds: No wheezing or rales.  Chest:     Chest wall: No tenderness.  Abdominal:     General: Bowel sounds are normal.     Palpations: Abdomen is soft.  Skin:    General: Skin is warm and dry.  Neurological:     General: No focal deficit present.     Mental Status: He is alert and oriented to person,  place, and time.  Psychiatric:        Mood and Affect: Mood normal.        Behavior:  Behavior normal.        Thought Content: Thought content normal.        Judgment: Judgment normal.       Assessment & Plan:  1. Migraine without aura and without status migrainosus, not intractable Patient prescribed tramadol to help to manage migraines. If there is no improvement with medication, pt will need to be refered to neurologist. - traMADol (ULTRAM) 50 MG tablet; Take 1 tablet (50 mg total) by mouth every 12 (twelve) hours as needed for up to 5 days.  Dispense: 10 tablet; Refill: 0  2. CKD (chronic kidney disease) stage 4, GFR 15-29 ml/min (HCC) Patient encouraged to schedule appointment with Dr. Jimmy Footman as soon as possible. - Ambulatory referral to Ophthalmology  3. NICM (nonischemic cardiomyopathy) (HCC) Stable. Continue current treatment - Ambulatory referral to Ophthalmology  4. TIA (transient ischemic attack) Resolved, continue current treatment - Ambulatory referral to Ophthalmology  5. Reactive depression Pt refuses medication treatment at this time. He encouraged to continue exercise, and eat healthier diet.

## 2018-09-18 NOTE — Patient Instructions (Addendum)
Good to see you today  I have sent in medication for your headaches, use sparingly, alternate with 2 Tylenol. I have put in a referral to eye doctor. If headaches not better, let me know and I will send you to headache specialist.   I'm sorry your mood has been depressed lately. Consider if you want to start medication.  Other things besides medication that helps- healthy food choices- avoid fast/processed food, increase your activity level. Keep a regular bedtime routine.   Frank Coffey notified Dr. Donette Larry that your were here, they said to just call them and they will reschedule you.   Follow up in 8-12 weeks for your annual exam

## 2018-09-19 ENCOUNTER — Telehealth: Payer: Self-pay

## 2018-09-19 NOTE — Telephone Encounter (Signed)
Copied from [facility] [id number]. Topic: General - Call Back - No Documentation >> Sep 19, 2018 12:05 PM Erick Blinks wrote: Reason for CRM: Ms. Jeani Hawking from [facility] Dr. Serita Grit office called requesting the fax ppt notes from yesterday's visit. She called [facility] but [facility] was seen at [facility] Fax number [phone] to attention Jeani Hawking

## 2018-09-19 NOTE — Telephone Encounter (Signed)
Faxed to [facility] attn Jeani Hawking / Dr Posey Pronto Will follow up that this has been received.

## 2018-09-20 NOTE — Telephone Encounter (Signed)
Called [facility], LM for Jeani Hawking to confirm receipt of fax.  Left detailed message. Advised that if not received to call back   Nothing further needed.

## 2018-10-09 ENCOUNTER — Other Ambulatory Visit: Payer: Self-pay

## 2018-10-09 ENCOUNTER — Emergency Department (HOSPITAL_BASED_OUTPATIENT_CLINIC_OR_DEPARTMENT_OTHER)
Admission: EM | Admit: 2018-10-09 | Discharge: 2018-10-09 | Disposition: A | Discharge status: Home / Self Care | Payer: Managed Care, Other (non HMO) | Attending: Emergency Medicine | Admitting: Emergency Medicine

## 2018-10-09 ENCOUNTER — Encounter (HOSPITAL_BASED_OUTPATIENT_CLINIC_OR_DEPARTMENT_OTHER): Payer: Self-pay | Admitting: Emergency Medicine

## 2018-10-09 ENCOUNTER — Emergency Department (HOSPITAL_BASED_OUTPATIENT_CLINIC_OR_DEPARTMENT_OTHER): Payer: Managed Care, Other (non HMO)

## 2018-10-09 DIAGNOSIS — I130 Hypertensive heart and chronic kidney disease with heart failure and stage 1 through stage 4 chronic kidney disease, or unspecified chronic kidney disease: Secondary | ICD-10-CM | POA: Diagnosis not present

## 2018-10-09 DIAGNOSIS — R079 Chest pain, unspecified: Secondary | ICD-10-CM | POA: Diagnosis not present

## 2018-10-09 DIAGNOSIS — Z7982 Long term (current) use of aspirin: Secondary | ICD-10-CM | POA: Diagnosis not present

## 2018-10-09 DIAGNOSIS — N184 Chronic kidney disease, stage 4 (severe): Secondary | ICD-10-CM | POA: Diagnosis not present

## 2018-10-09 DIAGNOSIS — Z79899 Other long term (current) drug therapy: Secondary | ICD-10-CM | POA: Diagnosis not present

## 2018-10-09 DIAGNOSIS — R0602 Shortness of breath: Secondary | ICD-10-CM | POA: Diagnosis not present

## 2018-10-09 DIAGNOSIS — I509 Heart failure, unspecified: Secondary | ICD-10-CM | POA: Diagnosis not present

## 2018-10-09 DIAGNOSIS — N189 Chronic kidney disease, unspecified: Secondary | ICD-10-CM

## 2018-10-09 LAB — CBC WITH DIFFERENTIAL/PLATELET
Abs Immature Granulocytes: 0.02 10*3/uL (ref 0.00–0.07)
Basophils Absolute: 0 10*3/uL (ref 0.0–0.1)
Basophils Relative: 0 %
Eosinophils Absolute: 0.2 10*3/uL (ref 0.0–0.5)
Eosinophils Relative: 2 %
HCT: 42.8 % (ref 39.0–52.0)
Hemoglobin: 12.9 g/dL — ABNORMAL LOW (ref 13.0–17.0)
Immature Granulocytes: 0 %
Lymphocytes Relative: 11 %
Lymphs Abs: 0.9 10*3/uL (ref 0.7–4.0)
MCH: 22.2 pg — ABNORMAL LOW (ref 26.0–34.0)
MCHC: 30.1 g/dL (ref 30.0–36.0)
MCV: 73.5 fL — ABNORMAL LOW (ref 80.0–100.0)
Monocytes Absolute: 1 10*3/uL (ref 0.1–1.0)
Monocytes Relative: 12 %
Neutro Abs: 6.5 10*3/uL (ref 1.7–7.7)
Neutrophils Relative %: 75 %
Platelets: 291 10*3/uL (ref 150–400)
RBC: 5.82 MIL/uL — ABNORMAL HIGH (ref 4.22–5.81)
RDW: 19.3 % — ABNORMAL HIGH (ref 11.5–15.5)
WBC: 8.7 10*3/uL (ref 4.0–10.5)
nRBC: 0 % (ref 0.0–0.2)

## 2018-10-09 LAB — COMPREHENSIVE METABOLIC PANEL
ALT: 24 U/L (ref 0–44)
AST: 25 U/L (ref 15–41)
Albumin: 3.4 g/dL — ABNORMAL LOW (ref 3.5–5.0)
Alkaline Phosphatase: 79 U/L (ref 38–126)
Anion gap: 12 (ref 5–15)
BUN: 39 mg/dL — ABNORMAL HIGH (ref 6–20)
CO2: 29 mmol/L (ref 22–32)
Calcium: 8.7 mg/dL — ABNORMAL LOW (ref 8.9–10.3)
Chloride: 97 mmol/L — ABNORMAL LOW (ref 98–111)
Creatinine, Ser: 4.24 mg/dL — ABNORMAL HIGH (ref 0.61–1.24)
GFR calc Af Amer: 19 mL/min — ABNORMAL LOW (ref >60)
GFR calc non Af Amer: 16 mL/min — ABNORMAL LOW (ref >60)
Glucose, Bld: 95 mg/dL (ref 70–99)
Potassium: 3.2 mmol/L — ABNORMAL LOW (ref 3.5–5.1)
Sodium: 138 mmol/L (ref 135–145)
Total Bilirubin: 0.8 mg/dL (ref 0.3–1.2)
Total Protein: 7.6 g/dL (ref 6.5–8.1)

## 2018-10-09 LAB — TROPONIN I (HIGH SENSITIVITY)
Troponin I (High Sensitivity): 82 ng/L — ABNORMAL HIGH (ref <18)
Troponin I (High Sensitivity): 85 ng/L — ABNORMAL HIGH (ref <18)

## 2018-10-09 LAB — BRAIN NATRIURETIC PEPTIDE: B Natriuretic Peptide: 152.2 pg/mL — ABNORMAL HIGH (ref 0.0–100.0)

## 2018-10-09 MED ORDER — ACETAMINOPHEN 325 MG PO TABS
650.0000 mg | ORAL_TABLET | Freq: Once | ORAL | Status: AC
  Administered 2018-10-09: 650 mg via ORAL
  Filled 2018-10-09: qty 2

## 2018-10-09 MED ORDER — SODIUM CHLORIDE 0.9 % IV BOLUS
500.0000 mL | Freq: Once | INTRAVENOUS | Status: AC
  Administered 2018-10-09: 500 mL via INTRAVENOUS

## 2018-10-09 MED ORDER — POTASSIUM CHLORIDE CRYS ER 20 MEQ PO TBCR
20.0000 meq | EXTENDED_RELEASE_TABLET | Freq: Once | ORAL | Status: AC
  Administered 2018-10-09: 20 meq via ORAL
  Filled 2018-10-09: qty 1

## 2018-10-09 NOTE — ED Notes (Signed)
ED Provider at bedside. 

## 2018-10-09 NOTE — ED Provider Notes (Signed)
[facility] DEPART[facility] Provider Note   CSN: [id number] Arrival date & time: 10/09/18  1009     History   Chief Complaint Chief Complaint  Patient presents with  . Shortness of Breath    HPI Frank Coffey is a 38 y.o. male.  He is complaining of increased shortness of breath and dyspnea on exertion along with swelling in his feet that started last night.  He took an extra dose of torsemide and he said he was urinating all night long.  This morning he noticed some lower chest discomfort and some cramping in his upper abdomen.  He has a history of CHF and usually follows with the [facility].  No recent medication changes.  He also said that he has been troubled with headaches over the past few weeks.  He said he had a history of headaches a few years ago and saw a neurologist and had been doing well up until recently.     The history is provided by the patient.  Shortness of Breath Severity:  Moderate Onset quality:  Gradual Duration:  24 hours Progression:  Improving Chronicity:  Recurrent Context: activity   Relieved by:  None tried Worsened by:  Activity Ineffective treatments:  Diuretics Associated symptoms: abdominal pain, chest pain and headaches   Associated symptoms: no cough, no diaphoresis, no fever, no neck pain, no rash, no sore throat and no vomiting     Past Medical History:  Diagnosis Date  . CHF (congestive heart failure) ([place])   . Chronic kidney disease, stage 3, mod decreased GFR ([facility])   . Gout   . 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 Hx of migraine headaches   . Hyperlipidemia   . Hypertension   . 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 Obesity   . OSA (obstructive sleep apnea)     Patient Active Problem List   Diagnosis Date Noted  . Mobitz type 1 second degree atrioventricular block 11/18/2017  . TIA (transient ischemic attack) 11/14/2017  . Hypertensive urgency 11/14/2017  .  Migraine 10/02/2016  . Erectile dysfunction 10/02/2016  . Annual physical exam 02/22/2016  . Onychomycosis 02/22/2016  . Pre-operative cardiovascular examination 07/19/2015  . NICM (nonischemic cardiomyopathy) ([place]) 07/19/2015  . Morbid obesity due to excess calories ([place]) 07/19/2015  . 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 CHF (congestive heart failure) ([place]) 12/19/2012  . Benign hypertension with chronic kidney disease, stage IV ([place]) 12/19/2012  . Gout 12/19/2012  . CKD (chronic kidney disease) stage 4, GFR 15-29 ml/min (H[facility]) 12/19/2012  . OSA (obstructive sleep apnea) 12/19/2012    Past Surgical History:  Procedure Laterality Date  . CHOLECYSTECTOMY    . RETINAL DETACHMENT REPAIR W/ SCLERAL BUCKLE LE     Left        Home Medications    Prior to Admission medications   Medication Sig Start Date End Date Taking? Authorizing Provider  allopurinol (ZYLOPRIM) 300 MG tablet Take 1 tablet (300 mg total) by mouth daily. 05/29/18   Elby Beck, FNP  aspirin 81 MG tablet Take 1 tablet (81 mg total) by mouth daily. 11/16/17   Samuella Cota, MD  atorvastatin (LIPITOR) 20 MG tablet Take 1 tablet (20 mg total) by mouth daily. 05/29/18   Elby Beck, FNP  carvedilol (COREG) 25 MG tablet Take 25 mg by mouth 2 (two) times daily with a meal.  11/20/17   [provider]  diclofenac  sodium (VOLTAREN) 1 % GEL Apply 2 g topically 4 (four) times daily. Rub into affected area of foot 2 to 4 times daily Patient taking differently: Apply 2 g topically 4 (four) times daily. As needed Rub into affected area of foot 2 to 4 times daily 11/30/17   Trula Slade, DPM  EQ FIBER SUPPLEMENT PO Take 2 tablets by mouth daily.     [provider]  fluticasone (FLONASE) 50 MCG/ACT nasal spray Place 2 sprays into both nostrils daily. Patient taking differently: Place 2 sprays into both nostrils daily as needed for allergies.  10/03/16   Brunetta Jeans, PA-C   hydrALAZINE (APRESOLINE) 100 MG tablet Take 1 tablet (100 mg total) by mouth 3 (three) times daily. 11/16/17   Samuella Cota, MD  potassium chloride SA (K-DUR) 20 MEQ tablet Take 1 tablet (20 mEq total) by mouth 2 (two) times daily. 09/03/18   Horton, Barbette Hair, MD  potassium chloride SA (K-DUR,KLOR-CON) 20 MEQ tablet Take 1 tablet (20 mEq total) by mouth daily. 11/16/17   Samuella Cota, MD  sildenafil (VIAGRA) 25 MG tablet Take 1 tablet (25 mg total) by mouth daily as needed for erectile dysfunction. 10/02/16   Brunetta Jeans, PA-C  spironolactone (ALDACTONE) 25 MG tablet Take 25 mg by mouth 2 (two) times daily.    [provider]  torsemide (DEMADEX) 20 MG tablet TAKE 2 TABLETS(40 MG) BY MOUTH TWICE DAILY 09/16/18   Elby Beck, FNP    Family History Family History  Problem Relation Age of Onset  . Hypertension Mother   . Hyperlipidemia Mother   . Heart disease Mother   . Hypertension Maternal Grandmother   . Stroke Maternal Grandmother   . Heart disease Maternal Grandmother   . Hyperlipidemia Maternal Grandmother   . Stroke Maternal Grandfather   . Hypertension Maternal Grandfather   . Heart disease Maternal Grandfather   . Hyperlipidemia Maternal Grandfather   . Alzheimer's disease Maternal Grandfather   . Cancer - Other Paternal Grandmother   . Healthy Brother        x2  . Healthy Sister        x2  . Healthy Son        x2  . Healthy Daughter        x1  . Diabetes Neg Hx   . Heart attack Neg Hx   . Sudden death Neg Hx     Social History Social History   Tobacco Use  . Smoking status: Never Smoker  . Smokeless tobacco: Never Used  Substance Use Topics  . Alcohol use: Yes    Comment: socially -- once every few months  . Drug use: No     Allergies   Patient has no known allergies.   Review of Systems Review of Systems  Constitutional: Negative for diaphoresis and fever.  HENT: Negative for sore throat.   Eyes: Negative for visual  disturbance.  Respiratory: Positive for shortness of breath. Negative for cough.   Cardiovascular: Positive for chest pain.  Gastrointestinal: Positive for abdominal pain. Negative for vomiting.  Genitourinary: Negative for dysuria.  Musculoskeletal: Negative for neck pain.  Skin: Negative for rash.  Neurological: Positive for headaches.     Physical Exam Updated Vital Signs BP (!) 184/107 (BP Location: Right Arm)   Pulse 96   Temp 99.4 F (37.4 C) (Oral)   Resp (!) 22   Ht 5\' 8"  (1.727 m)   Wt (!) 165.2 kg  SpO2 97%   BMI 55.38 kg/m   Physical Exam Vitals signs and nursing note reviewed.  Constitutional:      Appearance: He is well-developed.  HENT:     Head: Normocephalic and atraumatic.  Eyes:     Conjunctiva/sclera: Conjunctivae normal.  Neck:     Musculoskeletal: Neck supple.  Cardiovascular:     Rate and Rhythm: Normal rate and regular rhythm.     Heart sounds: No murmur.  Pulmonary:     Effort: Pulmonary effort is normal. No respiratory distress.     Breath sounds: Normal breath sounds.  Abdominal:     Palpations: Abdomen is soft.     Tenderness: There is no abdominal tenderness.  Musculoskeletal: Normal range of motion.     Right lower leg: He exhibits no tenderness. Edema present.     Left lower leg: He exhibits no tenderness. Edema present.  Skin:    General: Skin is warm and dry.     Capillary Refill: Capillary refill takes less than 2 seconds.  Neurological:     General: No focal deficit present.     Mental Status: He is alert and oriented to person, place, and time.     Motor: No weakness.      ED Treatments / Results  Labs (all labs ordered are listed, but only abnormal results are displayed) Labs Reviewed  COMPREHENSIVE METABOLIC PANEL - Abnormal; Notable for the following components:      Result Value   Potassium 3.2 (*)    Chloride 97 (*)    BUN 39 (*)    Creatinine, Ser 4.24 (*)    Calcium 8.7 (*)    Albumin 3.4 (*)    GFR calc  non Af Amer 16 (*)    GFR calc Af Amer 19 (*)    All other components within normal limits  CBC WITH DIFFERENTIAL/PLATELET - Abnormal; Notable for the following components:   RBC 5.82 (*)    Hemoglobin 12.9 (*)    MCV 73.5 (*)    MCH 22.2 (*)    RDW 19.3 (*)    All other components within normal limits  BRAIN NATRIURETIC PEPTIDE - Abnormal; Notable for the following components:   B Natriuretic Peptide 152.2 (*)    All other components within normal limits  TROPONIN I (HIGH SENSITIVITY) - Abnormal; Notable for the following components:   Troponin I (High Sensitivity) 82 (*)    All other components within normal limits  TROPONIN I (HIGH SENSITIVITY) - Abnormal; Notable for the following components:   Troponin I (High Sensitivity) 85 (*)    All other components within normal limits    EKG EKG Interpretation  Date/Time:  Wednesday October 09 2018 10:19:52 EDT Ventricular Rate:  93 PR Interval:    QRS Duration: 116 QT Interval:  403 QTC Calculation: 502 R Axis:   16 Text Interpretation:  Sinus rhythm Probable left atrial enlargement LVH with IVCD and secondary repol abnrm Prolonged QT interval similar to prior 8/20 Confirmed by Aletta Edouard [phone]) on 10/09/2018 10:24:17 AM   Radiology Dg Chest Port 1 View  Result Date: 10/09/2018 CLINICAL DATA:  Shortness of breath EXAM: PORTABLE CHEST 1 VIEW COMPARISON:  10/29/2017 FINDINGS: Cardiomegaly. Underpenetrated examination. Both lungs are clear. The visualized skeletal structures are unremarkable. IMPRESSION: Cardiomegaly without acute abnormality of the lungs on underpenetrated AP portable projection. Electronically Signed   By: Eddie Candle M.D.   On: 10/09/2018 10:53    Procedures Procedures (including critical care time)  Medications Ordered in ED Medications  sodium chloride 0.9 % bolus 500 mL ( Intravenous Stopped 10/09/18 1205)  potassium chloride SA (K-DUR) CR tablet 20 mEq (20 mEq Oral Given 10/09/18 1151)   acetaminophen (TYLENOL) tablet 650 mg (650 mg Oral Given 10/09/18 1205)     Initial Impression / Assessment and Plan / ED Course  I have reviewed the triage vital signs and the nursing notes.  Pertinent labs & imaging results that were available during my care of the patient were reviewed by me and considered in my medical decision making (see chart for details).  Clinical Course as of Oct 08 1653  Wed Oct 08, 5376  4130 38 year old male with history of CHF here with complaint of shortness of breath and leg swelling foot swelling.  Also had some chest discomfort upper abdominal discomfort and headache.  Differential diagnosis includes CHF, ACS, pneumonia, metabolic derangement.    [MB]  1100 Chest x-ray reviewed by me, no gross infiltrate.   [MB]  1238 Troponin elevated 82.  Will need a delta troponin.  Otherwise his labs are both baseline other than a mildly low potassium.  Creatinine elevated but similar to last month.  This was a fairly large increase from a year ago.  BNP slightly elevated at 152 but no evidence of failure by chest x-ray.   [MB]  0601 Patient's potassium was low and I have given him some oral potassium.   [MB]  5615 Elevated but only went up 3.  Reevaluated patient and he says he feels back to baseline.  He thinks the fluid helped.  He denies shortness of breath or any chest discomfort.  He said his headache is also improved.  He has seen his PCP for his headaches and has an appointment soon with his kidney doctor.  I let him know that his creatinine is elevated compared with last year and he will need close work with his kidney doctor to do this.  He understands to return if any worsening symptoms.   [MB]    Clinical Course User Index [MB] Hayden Rasmussen, MD        Final Clinical Impressions(s) / ED Diagnoses   Final diagnoses:  SOB (shortness of breath)  Chronic kidney disease, unspecified CKD stage  Nonspecific chest pain    ED Discharge Orders    None        Hayden Rasmussen, MD 10/09/18 1655

## 2018-10-09 NOTE — ED Triage Notes (Signed)
SOB hit yesterday while at [facility].  Feet and ankles swollen last night.  Took fluid pill.  Was up all night voiding.  Sts this morning he has pain and tenderness in his low sternum and epigastric area.

## 2018-10-09 NOTE — Discharge Instructions (Addendum)
You were seen in the emergency department for shortness of breath chest pressure and a headache.  Your blood work showed signes of worsening renal function and will need close follow-up with your PCP and kidney specialist. If your symptoms return or any other concerns please return to the Emergency department.

## 2018-11-04 ENCOUNTER — Other Ambulatory Visit: Payer: Self-pay | Admitting: Family Medicine

## 2018-11-04 DIAGNOSIS — I509 Heart failure, unspecified: Secondary | ICD-10-CM

## 2018-11-04 DIAGNOSIS — I150 Renovascular hypertension: Secondary | ICD-10-CM

## 2018-11-04 DIAGNOSIS — E876 Hypokalemia: Secondary | ICD-10-CM

## 2018-11-04 DIAGNOSIS — E785 Hyperlipidemia, unspecified: Secondary | ICD-10-CM

## 2018-11-04 DIAGNOSIS — D631 Anemia in chronic kidney disease: Secondary | ICD-10-CM

## 2018-11-04 DIAGNOSIS — R7303 Prediabetes: Secondary | ICD-10-CM

## 2018-11-04 DIAGNOSIS — N184 Chronic kidney disease, stage 4 (severe): Secondary | ICD-10-CM

## 2018-11-07 ENCOUNTER — Telehealth: Payer: Self-pay

## 2018-11-07 NOTE — Telephone Encounter (Signed)
LVM w COVID screen, front door info and back lab info

## 2018-11-11 ENCOUNTER — Other Ambulatory Visit: Payer: Self-pay

## 2018-11-11 ENCOUNTER — Other Ambulatory Visit: Payer: Managed Care, Other (non HMO)

## 2018-11-18 ENCOUNTER — Encounter: Payer: Managed Care, Other (non HMO) | Admitting: Family Medicine

## 2018-11-22 ENCOUNTER — Encounter: Payer: Self-pay | Admitting: Family Medicine

## 2019-01-07 ENCOUNTER — Telehealth: Payer: Self-pay | Admitting: Family Medicine

## 2019-01-07 NOTE — Telephone Encounter (Signed)
Margreta Journey, NP with [facility] called today in regards to paperwork she has been faxing over to [place]. She stated this is a plan of care that has some areas for the provider to fill out. Margreta Journey stated she faxed this on 12/08. 12/14 and again this morning. She wanted to check status and make sure we have received the paperwork  Phone [phone] Ex [phone]

## 2019-01-07 NOTE — Telephone Encounter (Signed)
We have finally received these forms - will be placed in Debbie's inbasket.

## 2019-01-13 NOTE — Telephone Encounter (Signed)
Forms completed and returned to Varney Daily, CMA to fax and send to scan.

## 2019-01-13 NOTE — Telephone Encounter (Addendum)
Form faxed back to [phone] - [facility])  Called and made aware as well

## 2019-01-14 NOTE — Telephone Encounter (Signed)
Received confirmation that fax was received.   Nothing further needed.

## 2019-02-11 ENCOUNTER — Ambulatory Visit: Payer: Managed Care, Other (non HMO) | Admitting: Family Medicine

## 2019-02-11 DIAGNOSIS — Z0289 Encounter for other administrative examinations: Secondary | ICD-10-CM

## 2019-02-19 ENCOUNTER — Other Ambulatory Visit: Payer: Self-pay

## 2019-02-19 ENCOUNTER — Emergency Department (HOSPITAL_BASED_OUTPATIENT_CLINIC_OR_DEPARTMENT_OTHER)
Admission: EM | Admit: 2019-02-19 | Discharge: 2019-02-19 | Disposition: A | Discharge status: Home / Self Care | Payer: Managed Care, Other (non HMO) | Attending: Emergency Medicine | Admitting: Emergency Medicine

## 2019-02-19 ENCOUNTER — Encounter (HOSPITAL_BASED_OUTPATIENT_CLINIC_OR_DEPARTMENT_OTHER): Payer: Self-pay | Admitting: *Deleted

## 2019-02-19 ENCOUNTER — Emergency Department (HOSPITAL_BASED_OUTPATIENT_CLINIC_OR_DEPARTMENT_OTHER): Payer: Managed Care, Other (non HMO)

## 2019-02-19 DIAGNOSIS — Z79899 Other long term (current) drug therapy: Secondary | ICD-10-CM | POA: Diagnosis not present

## 2019-02-19 DIAGNOSIS — K5900 Constipation, unspecified: Secondary | ICD-10-CM

## 2019-02-19 DIAGNOSIS — I130 Hypertensive heart and chronic kidney disease with heart failure and stage 1 through stage 4 chronic kidney disease, or unspecified chronic kidney disease: Secondary | ICD-10-CM | POA: Diagnosis not present

## 2019-02-19 DIAGNOSIS — Z7982 Long term (current) use of aspirin: Secondary | ICD-10-CM | POA: Insufficient documentation

## 2019-02-19 DIAGNOSIS — Z8673 Personal history of transient ischemic attack (TIA), and cerebral infarction without residual deficits: Secondary | ICD-10-CM | POA: Diagnosis not present

## 2019-02-19 DIAGNOSIS — N184 Chronic kidney disease, stage 4 (severe): Secondary | ICD-10-CM | POA: Insufficient documentation

## 2019-02-19 DIAGNOSIS — I509 Heart failure, unspecified: Secondary | ICD-10-CM | POA: Diagnosis not present

## 2019-02-19 LAB — COMPREHENSIVE METABOLIC PANEL
ALT: 29 U/L (ref 0–44)
AST: 29 U/L (ref 15–41)
Albumin: 3.4 g/dL — ABNORMAL LOW (ref 3.5–5.0)
Alkaline Phosphatase: 80 U/L (ref 38–126)
Anion gap: 7 (ref 5–15)
BUN: 29 mg/dL — ABNORMAL HIGH (ref 6–20)
CO2: 30 mmol/L (ref 22–32)
Calcium: 8.7 mg/dL — ABNORMAL LOW (ref 8.9–10.3)
Chloride: 99 mmol/L (ref 98–111)
Creatinine, Ser: 4.37 mg/dL — ABNORMAL HIGH (ref 0.61–1.24)
GFR calc Af Amer: 18 mL/min — ABNORMAL LOW (ref >60)
GFR calc non Af Amer: 16 mL/min — ABNORMAL LOW (ref >60)
Glucose, Bld: 92 mg/dL (ref 70–99)
Potassium: 3.3 mmol/L — ABNORMAL LOW (ref 3.5–5.1)
Sodium: 136 mmol/L (ref 135–145)
Total Bilirubin: 0.5 mg/dL (ref 0.3–1.2)
Total Protein: 7.6 g/dL (ref 6.5–8.1)

## 2019-02-19 LAB — CBC WITH DIFFERENTIAL/PLATELET
Abs Immature Granulocytes: 0.03 10*3/uL (ref 0.00–0.07)
Basophils Absolute: 0 10*3/uL (ref 0.0–0.1)
Basophils Relative: 0 %
Eosinophils Absolute: 0.3 10*3/uL (ref 0.0–0.5)
Eosinophils Relative: 3 %
HCT: 42.1 % (ref 39.0–52.0)
Hemoglobin: 12.8 g/dL — ABNORMAL LOW (ref 13.0–17.0)
Immature Granulocytes: 0 %
Lymphocytes Relative: 15 %
Lymphs Abs: 1.3 10*3/uL (ref 0.7–4.0)
MCH: 22.9 pg — ABNORMAL LOW (ref 26.0–34.0)
MCHC: 30.4 g/dL (ref 30.0–36.0)
MCV: 75.4 fL — ABNORMAL LOW (ref 80.0–100.0)
Monocytes Absolute: 1.4 10*3/uL — ABNORMAL HIGH (ref 0.1–1.0)
Monocytes Relative: 15 %
Neutro Abs: 5.9 10*3/uL (ref 1.7–7.7)
Neutrophils Relative %: 67 %
Platelets: 302 10*3/uL (ref 150–400)
RBC: 5.58 MIL/uL (ref 4.22–5.81)
RDW: 17.4 % — ABNORMAL HIGH (ref 11.5–15.5)
WBC: 9 10*3/uL (ref 4.0–10.5)
nRBC: 0 % (ref 0.0–0.2)

## 2019-02-19 LAB — LIPASE, BLOOD: Lipase: 28 U/L (ref 11–51)

## 2019-02-19 MED ORDER — FENTANYL CITRATE (PF) 100 MCG/2ML IJ SOLN: 50.0000 ug | Freq: Once | INTRAMUSCULAR | Status: DC

## 2019-02-19 MED ORDER — FLEET ENEMA 7-19 GM/118ML RE ENEM
1.0000 | ENEMA | Freq: Once | RECTAL | Status: AC
  Administered 2019-02-19: 21:00:00 1 via RECTAL
  Filled 2019-02-19: qty 1

## 2019-02-19 MED ORDER — ONDANSETRON HCL 4 MG/2ML IJ SOLN: 4.0000 mg | Freq: Once | INTRAMUSCULAR | Status: DC

## 2019-02-19 NOTE — ED Triage Notes (Signed)
Pt c/o mid abd pain and constipation  x 3 days , no relief with mag cit .

## 2019-02-19 NOTE — ED Notes (Addendum)
Attempted x 1 to obtain IV and labs, unable to obtain. EDP aware.

## 2019-02-19 NOTE — Discharge Instructions (Signed)
As we discussed, you can use things like MiraLAX and mag citrate to help with your constipation.  Additionally, you get over-the-counter fleets enemas.  Your work-up was reassuring today.  Return the emergency department for any worsening abdominal pain, fevers or any other worsening concerning symptoms.

## 2019-02-19 NOTE — ED Provider Notes (Signed)
[facility] EMERGENCY DEPARTMENT Provider Note   CSN: [id number] Arrival date & time: 02/19/19  1813     History Chief Complaint  Patient presents with  . Constipation    Frank Coffey is a 39 y.o. male has a history of CHF, CKD, hyperlipidemia, hypertension who presents for evaluation of constipation x4 days.  He states that he has had associated normal pressure and discomfort.  He states that he has not had a solid bowel movement about 4 days.  He states he has had some small amounts of liquid stool.  He states that he feels bloated secondary to constipation.  He has not had any vomiting or nausea.  He states he tried fiber pills as well as mag citrate with no improvement in symptoms.  He states that he has still been passing flatus without any difficulty.  He denies any fevers, chest pain, difficulty breathing, difficulty urinating, dysuria, hematuria.  The history is provided by the patient.       Past Medical History:  Diagnosis Date  . CHF (congestive heart failure) ([place])   . Chronic kidney disease, stage 3, mod decreased GFR   . Gout   . 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 Hx of migraine headaches   . Hyperlipidemia   . Hypertension   . Hypertensive heart disease with congestive heart failure and stage 3 kidney disease ([place])   . Obesity   . OSA (obstructive sleep apnea)     Patient Active Problem List   Diagnosis Date Noted  . Mobitz type 1 second degree atrioventricular block 11/18/2017  . TIA (transient ischemic attack) 11/14/2017  . Hypertensive urgency 11/14/2017  . Migraine 10/02/2016  . Erectile dysfunction 10/02/2016  . Annual physical exam 02/22/2016  . Onychomycosis 02/22/2016  . Pre-operative cardiovascular examination 07/19/2015  . NICM (nonischemic cardiomyopathy) ([place]) 07/19/2015  . Morbid obesity due to excess calories ([place]) 07/19/2015  . 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 CHF (congestive heart failure) ([place]) 12/19/2012  . Benign  hypertension with chronic kidney disease, stage IV ([place]) 12/19/2012  . Gout 12/19/2012  . CKD (chronic kidney disease) stage 4, GFR 15-29 ml/min ([facility]) 12/19/2012  . OSA (obstructive sleep apnea) 12/19/2012    Past Surgical History:  Procedure Laterality Date  . CHOLECYSTECTOMY    . RETINAL DETACHMENT REPAIR W/ SCLERAL BUCKLE LE     Left       Family History  Problem Relation Age of Onset  . Hypertension Mother   . Hyperlipidemia Mother   . Heart disease Mother   . Hypertension Maternal Grandmother   . Stroke Maternal Grandmother   . Heart disease Maternal Grandmother   . Hyperlipidemia Maternal Grandmother   . Stroke Maternal Grandfather   . Hypertension Maternal Grandfather   . Heart disease Maternal Grandfather   . Hyperlipidemia Maternal Grandfather   . Alzheimer's disease Maternal Grandfather   . Cancer - Other Paternal Grandmother   . Healthy Brother        x2  . Healthy Sister        x2  . Healthy Son        x2  . Healthy Daughter        x1  . Diabetes Neg Hx   . Heart attack Neg Hx   . Sudden death Neg Hx     Social History   Tobacco Use  . Smoking status: Never Smoker  . Smokeless tobacco: Never Used  Substance Use Topics  . Alcohol use: Yes  Comment: socially -- once every few months  . Drug use: No    Home Medications Prior to Admission medications   Medication Sig Start Date End Date Taking? Authorizing Provider  allopurinol (ZYLOPRIM) 300 MG tablet Take 1 tablet (300 mg total) by mouth daily. 05/29/18   Elby Beck, FNP  aspirin 81 MG tablet Take 1 tablet (81 mg total) by mouth daily. 11/16/17   Samuella Cota, MD  atorvastatin (LIPITOR) 20 MG tablet Take 1 tablet (20 mg total) by mouth daily. 05/29/18   Elby Beck, FNP  carvedilol (COREG) 25 MG tablet Take 25 mg by mouth 2 (two) times daily with a meal.  11/20/17   [provider]  diclofenac sodium (VOLTAREN) 1 % GEL Apply 2 g topically 4 (four) times daily. Rub into  affected area of foot 2 to 4 times daily Patient taking differently: Apply 2 g topically 4 (four) times daily. As needed Rub into affected area of foot 2 to 4 times daily 11/30/17   Trula Slade, DPM  EQ FIBER SUPPLEMENT PO Take 2 tablets by mouth daily.     [provider]  fluticasone (FLONASE) 50 MCG/ACT nasal spray Place 2 sprays into both nostrils daily. Patient taking differently: Place 2 sprays into both nostrils daily as needed for allergies.  10/03/16   Brunetta Jeans, PA-C  hydrALAZINE (APRESOLINE) 100 MG tablet Take 1 tablet (100 mg total) by mouth 3 (three) times daily. 11/16/17   Samuella Cota, MD  potassium chloride SA (K-DUR) 20 MEQ tablet Take 1 tablet (20 mEq total) by mouth 2 (two) times daily. 09/03/18   Horton, Barbette Hair, MD  potassium chloride SA (K-DUR,KLOR-CON) 20 MEQ tablet Take 1 tablet (20 mEq total) by mouth daily. 11/16/17   Samuella Cota, MD  sildenafil (VIAGRA) 25 MG tablet Take 1 tablet (25 mg total) by mouth daily as needed for erectile dysfunction. 10/02/16   Brunetta Jeans, PA-C  spironolactone (ALDACTONE) 25 MG tablet Take 25 mg by mouth 2 (two) times daily.    [provider]  torsemide (DEMADEX) 20 MG tablet TAKE 2 TABLETS(40 MG) BY MOUTH TWICE DAILY 09/16/18   Elby Beck, FNP    Allergies    Patient has no known allergies.  Review of Systems   Review of Systems  Constitutional: Negative for fever.  Respiratory: Negative for cough and shortness of breath.   Cardiovascular: Negative for chest pain.  Gastrointestinal: Positive for abdominal pain and constipation. Negative for nausea and vomiting.  Genitourinary: Negative for dysuria and hematuria.  Neurological: Negative for headaches.  All other systems reviewed and are negative.   Physical Exam Updated Vital Signs BP (!) 203/131 (BP Location: Right Wrist)   Pulse 92   Temp 99.1 F (37.3 C) (Oral)   Resp 15   Ht 5\' 8"  (1.727 m)   Wt (!) 164.2 kg   SpO2  98%   BMI 55.04 kg/m   Physical Exam Vitals and nursing note reviewed. Exam conducted with a chaperone present.  Constitutional:      Appearance: Normal appearance. He is well-developed.  HENT:     Head: Normocephalic and atraumatic.  Eyes:     General: Lids are normal.     Conjunctiva/sclera: Conjunctivae normal.     Pupils: Pupils are equal, round, and reactive to light.  Cardiovascular:     Rate and Rhythm: Normal rate and regular rhythm.     Pulses: Normal pulses.  Heart sounds: Normal heart sounds. No murmur. No friction rub. No gallop.   Pulmonary:     Effort: Pulmonary effort is normal.     Breath sounds: Normal breath sounds.  Abdominal:     Palpations: Abdomen is soft. Abdomen is not rigid.     Tenderness: There is generalized abdominal tenderness. There is no guarding.     Comments: Abdomen soft, nondistended.  Diffuse tenderness.  No rigidity, guarding.  No CVA tenderness noted bilaterally.  Genitourinary:    Comments: The exam was performed with a chaperone present. Normal male genitalia. No evidence of rash, ulcers or lesions.  DRE showed no evidence of melena.  No tenderness palpation.  No palpable mass.  No blood noted on digital rectal exam.  I was unable to palpate any stool for disimpaction. Musculoskeletal:        General: Normal range of motion.     Cervical back: Full passive range of motion without pain.  Skin:    General: Skin is warm and dry.     Capillary Refill: Capillary refill takes less than 2 seconds.  Neurological:     Mental Status: He is alert and oriented to person, place, and time.  Psychiatric:        Speech: Speech normal.     ED Results / Procedures / Treatments   Labs (all labs ordered are listed, but only abnormal results are displayed) Labs Reviewed  COMPREHENSIVE METABOLIC PANEL - Abnormal; Notable for the following components:      Result Value   Potassium 3.3 (*)    BUN 29 (*)    Creatinine, Ser 4.37 (*)    Calcium 8.7  (*)    Albumin 3.4 (*)    GFR calc non Af Amer 16 (*)    GFR calc Af Amer 18 (*)    All other components within normal limits  CBC WITH DIFFERENTIAL/PLATELET - Abnormal; Notable for the following components:   Hemoglobin 12.8 (*)    MCV 75.4 (*)    MCH 22.9 (*)    RDW 17.4 (*)    Monocytes Absolute 1.4 (*)    All other components within normal limits  LIPASE, BLOOD    EKG None  Radiology DG Abdomen 1 View  Result Date: 02/19/2019 CLINICAL DATA:  39 year old male with abdominal pain. EXAM: ABDOMEN - 1 VIEW COMPARISON:  None. FINDINGS: There is moderate amount of stool throughout the colon. There is no bowel dilatation or evidence of obstruction. No free air or radiopaque calculi. Right upper quadrant cholecystectomy clips. The soft tissues are grossly unremarkable. No acute osseous pathology. Apparent linear bony density in the right hemipelvis may represent an exostosis or artifact. IMPRESSION: No bowel obstruction. Electronically Signed   By: Anner Crete M.D.   On: 02/19/2019 18:58    Procedures Procedures (including critical care time)  Medications Ordered in ED Medications  fentaNYL (SUBLIMAZE) injection 50 mcg (has no administration in time range)  ondansetron (ZOFRAN) injection 4 mg (has no administration in time range)  sodium phosphate (FLEET) 7-19 GM/118ML enema 1 enema (1 enema Rectal Given 02/19/19 2110)    ED Course  I have reviewed the triage vital signs and the nursing notes.  Pertinent labs & imaging results that were available during my care of the patient were reviewed by me and considered in my medical decision making (see chart for details).    MDM Rules/Calculators/A&P  39 year old male who presents for evaluation of constipation and abdominal pressure x4 days.  He states he has had liquid stools and has not been able to have a good bowel movement for days.  He still passing flatus.  No vomiting, fevers.  He reports feeling bloated.   Initially arrival, he is afebrile, nontoxic-appearing.  He is slightly hypertensive.  He does have a history of high blood pressure.  He has some diffuse generalized tenderness no focal point, rigidity.  Doubt small bowel obstruction given that he is able to eat and has not any vomiting and is still passing flatus.  Question if this is constipation.  We will plan to check labs, KUB.  KUB shows no evidence of bowel obstruction.  He does have a moderate amount of stool throughout the colon.  CBC shows no leukocytosis.  Hemoglobin stable at 12.8. Lipase is within normal limits.  CMP shows potassium of 3.3, BUN 29, creatinine of 4.37.  This is consistent with his baseline chronic kidney disease.  I discussed plan with patient.  Will attempt to see if there is anything to manually disimpact and then try enema.  Patient is agreeable to plan.  Attempted manual disimpaction with a chaperone present.  I was unable to locate any stool.  We will plan for enema.  Patient had large bowel movement after enema.  He reports feeling more comfortable.  Abdominal pressure is gone.  He states he is ready to go as he wants to go eat something.  Patient is hemodynamically stable.  Encouraged at home supportive care measures.  At this time, given reassuring work-up and overall well appearance after good bowel movement, feel that patient is reasonable for discharge. At this time, patient exhibits no emergent life-threatening condition that require further evaluation in ED or admission. Patient had ample opportunity for questions and discussion. All patient's questions were answered with full understanding. Strict return precautions discussed. Patient expresses understanding and agreement to plan.   Portions of this note were generated with Lobbyist. Dictation errors may occur despite best attempts at proofreading.   Final Clinical Impression(s) / ED Diagnoses Final diagnoses:  Constipation, unspecified  constipation type    Rx / DC Orders ED Discharge Orders    None       Desma Mcgregor 02/19/19 2153    Lucrezia Starch, MD 02/21/19 [phone]

## 2019-04-02 ENCOUNTER — Encounter: Payer: Self-pay | Admitting: Family Medicine

## 2019-04-02 ENCOUNTER — Other Ambulatory Visit: Payer: Self-pay

## 2019-04-02 ENCOUNTER — Ambulatory Visit (INDEPENDENT_AMBULATORY_CARE_PROVIDER_SITE_OTHER): Payer: Managed Care, Other (non HMO) | Admitting: Family Medicine

## 2019-04-02 VITALS — BP 128/75 | HR 104 | Temp 98.7°F | Ht 69.0 in | Wt 374.5 lb

## 2019-04-02 DIAGNOSIS — H6981 Other specified disorders of Eustachian tube, right ear: Secondary | ICD-10-CM

## 2019-04-02 DIAGNOSIS — R42 Dizziness and giddiness: Secondary | ICD-10-CM

## 2019-04-02 DIAGNOSIS — H9311 Tinnitus, right ear: Secondary | ICD-10-CM | POA: Diagnosis not present

## 2019-04-02 MED ORDER — FLUTICASONE PROPIONATE 50 MCG/ACT NA SUSP: 2.0000 | Freq: Every day | NASAL | 1 refills | Status: DC

## 2019-04-02 MED ORDER — PREDNISONE 20 MG PO TABS: ORAL_TABLET | ORAL | 0 refills | Status: DC

## 2019-04-02 NOTE — Progress Notes (Signed)
Frank Kavan T. Brady Schiller, MD Primary Care and [place] at [facility] [place], [address] Phone: [phone]  FAX: [phone]  [name] - 39 y.o. male  MRN [id number]  Date of Birth: 01/28/1980  Visit Date: 04/02/2019  PCP: Elby Beck, FNP  Referred by: Elby Beck, FNP  Chief Complaint  Patient presents with  . Tinnitus    Right    This visit occurred during the SARS-CoV-2 public health emergency.  Safety protocols were in place, including screening questions prior to the visit, additional usage of staff PPE, and extensive cleaning of exam room while observing appropriate contact time as indicated for disinfecting solutions.   Subjective:   Frank Coffey is a 39 y.o. very pleasant male patient who presents with the following:  For the last couple of weeks he has had some ringing in his ears.  He is also had some pressure and rare vertigo that lasts for only a number of seconds.  He notices this when he gets up from is better with rapid movements and rotation of the neck.  He has had some occasional postnasal drip and he does feels a pressure behind his right ear.  He has not noted any alteration of his hearing.  Traditionally he does not have any seasonal allergies.  Has tried some dayquil - will take it out for a while.   128/75 BP  Review of Systems is noted in the HPI, as appropriate  Objective:   BP 128/75 Comment (Cuff Size): Thigh cuff  Pulse (!) 104   Temp 98.7 F (37.1 C) (Temporal)   Ht 5\' 9"  (1.753 m)   Wt (!) 374 lb 8 oz (169.9 kg)   SpO2 96%   BMI 55.30 kg/m    GEN: No acute distress; alert,appropriate. PULM: Breathing comfortably in no respiratory distress PSYCH: Normally interactive.   There are no wax buildup on either tympanic canal.  The right ear does have some significant fluid, but there is no discoloration or absence of visualization of the patient's  landmarks.  Throat is clear.  No lymphadenopathy.  No pressure with palpation of the sinuses.  With rotation of the head this does cause some inducible vertigo.  Laboratory and Imaging Data:  Assessment and Plan:     ICD-10-CM   1. Eustachian tube dysfunction, right  H69.81   2. Tinnitus of right ear  H93.11   3. Vertigo  R42    Failure of other outpatient medications.  Hopefully this tinnitus is short-lived, with only 2 weeks of duration.  We did go over this, and often this can be a very long time if not lifelong issue.  I think that the eustachian tube dysfunction as well as some intermittent vertigo is likely coming from the fluid in his ears.  Benign positional vertigo, short-lived.  Add antihistamines, Flonase, Deltasone.   Patient Instructions  Eustachian Tube Dysfunction:   This is the blocked up tube that runs from behind your ears to your sinuses.   There is a tube that connects between the sinuses and behind the ear called the "eustachian tube." Sometimes when you have allergies, a cold, or nasal congestion for any reason this tube can get blocked and pressure cannot equalize in your ears. (Like if you swim in deep water) This can also trap fluid behind the ear and give you a full, pressure-like sensation that is uncomfortable, but it is not an ear infection.  Recommendations: Anti-Histamine: Allegra, Zyrtec, or Claritin. All over the counter now and once a day.  Nasal steroid. Nasacort is over the counter now. About 10 prescription ones exist.  I also sent you in some nasal steroids    Tinnitus Tinnitus refers to hearing a sound when there is no actual source for that sound. This is often described as ringing in the ears. However, people with this condition may hear a variety of noises, in one ear or in both ears. The sounds of tinnitus can be soft, loud, or somewhere in between. Tinnitus can last for a few seconds or can be constant for days. It may go away  without treatment and come back at various times. When tinnitus is constant or happens often, it can lead to other problems, such as trouble sleeping and trouble concentrating. Almost everyone experiences tinnitus at some point. Tinnitus that is long-lasting (chronic) or comes back often (recurs) may require medical attention. What are the causes? The cause of tinnitus is often not known. In some cases, it can result from:  Exposure to loud noises from machinery, music, or other sources.  An object (foreign body) stuck in the ear.  Earwax buildup.  Drinking alcohol or caffeine.  Taking certain medicines.  Age-related hearing loss. It may also be caused by medical conditions such as:  Ear or sinus infections.  High blood pressure.  Heart diseases.  Anemia.  Allergies.  Meniere's disease.  Thyroid problems.  Tumors.  A weak, bulging blood vessel (aneurysm) near the ear. What are the signs or symptoms? The main symptom of tinnitus is hearing a sound when there is no source for that sound. It may sound like:  Buzzing.  Roaring.  Ringing.  Blowing air.  Hissing.  Whistling.  Sizzling.  Humming.  Running water.  A musical note.  Tapping. Symptoms may affect only one ear (unilateral) or both ears (bilateral). How is this diagnosed? Tinnitus is diagnosed based on your symptoms, your medical history, and a physical exam. Your health care provider may do a thorough hearing test (audiologic exam) if your tinnitus:  Is unilateral.  Causes hearing difficulties.  Lasts 6 months or longer. You may work with a health care provider who specializes in hearing disorders (audiologist). You may be asked questions about your symptoms and how they affect your daily life. You may have other tests done, such as:  CT scan.  MRI.  An imaging test of how blood flows through your blood vessels (angiogram). How is this treated? Treating an underlying medical condition  can sometimes make tinnitus go away. If your tinnitus continues, other treatments may include:  Medicines.  Therapy and counseling to help you manage the stress of living with tinnitus.  Sound generators to mask the tinnitus. These include: ? Tabletop sound machines that play relaxing sounds to help you fall asleep. ? Wearable devices that fit in your ear and play sounds or music. ? Acoustic neural stimulation. This involves using headphones to listen to music that contains an auditory signal. Over time, listening to this signal may change some pathways in your brain and make you less sensitive to tinnitus. This treatment is used for very severe cases when no other treatment is working.  Using hearing aids or cochlear implants if your tinnitus is related to hearing loss. Hearing aids are worn in the outer ear. Cochlear implants are surgically placed in the inner ear. Follow these instructions at home: Managing symptoms      When possible, avoid  being in loud places and being exposed to loud sounds.  Wear hearing protection, such as earplugs, when you are exposed to loud noises.  Use a white noise machine, a humidifier, or other devices to mask the sound of tinnitus.  Practice techniques for reducing stress, such as meditation, yoga, or deep breathing. Work with your health care provider if you need help with managing stress.  Sleep with your head slightly raised. This may reduce the impact of tinnitus. General instructions  Do not use stimulants, such as nicotine, alcohol, or caffeine. Talk with your health care provider about other stimulants to avoid. Stimulants are substances that can make you feel alert and attentive by increasing certain activities in the body (such as heart rate and blood pressure). These substances may make tinnitus worse.  Take over-the-counter and prescription medicines only as told by your health care provider.  Try to get plenty of sleep each  night.  Keep all follow-up visits as told by your health care provider. This is important. Contact a health care provider if:  Your tinnitus continues for 3 weeks or longer without stopping.  You develop sudden hearing loss.  Your symptoms get worse or do not get better with home care.  You feel you are not able to manage the stress of living with tinnitus. Get help right away if:  You develop tinnitus after a head injury.  You have tinnitus along with any of the following: ? Dizziness. ? Loss of balance. ? Nausea and vomiting. ? Sudden, severe headache. These symptoms may represent a serious problem that is an emergency. Do not wait to see if the symptoms will go away. Get medical help right away. Call your local emergency services (911 in the [place] Do not drive yourself to the hospital. Summary  Tinnitus refers to hearing a sound when there is no actual source for that sound. This is often described as ringing in the ears.  Symptoms may affect only one ear (unilateral) or both ears (bilateral).  Use a white noise machine, a humidifier, or other devices to mask the sound of tinnitus.  Do not use stimulants, such as nicotine, alcohol, or caffeine. Talk with your health care provider about other stimulants to avoid. These substances may make tinnitus worse. This information is not intended to replace advice given to you by your health care provider. Make sure you discuss any questions you have with your health care provider. Document Revised: 07/17/2018 Document Reviewed: 10/12/2016 Elsevier Patient Education  [place].     Follow-up: No follow-ups on file.  Meds ordered this encounter  Medications  . fluticasone (FLONASE) 50 MCG/ACT nasal spray    Sig: Place 2 sprays into both nostrils daily.    Dispense:  16 g    Refill:  1  . predniSONE (DELTASONE) 20 MG tablet    Sig: 2 tabs po daily for 5 days, then 1 tab po daily for 5 days    Dispense:  15 tablet     Refill:  0   Medications Discontinued During This Encounter  Medication Reason  . potassium chloride SA (K-DUR,KLOR-CON) 20 MEQ tablet Duplicate  . diclofenac sodium (VOLTAREN) 1 % GEL Completed Course  . fluticasone (FLONASE) 50 MCG/ACT nasal spray Completed Course   No orders of the defined types were placed in this encounter.   Signed,  Maud Deed. Aryaan Persichetti, MD   Outpatient Encounter Medications as of 04/02/2019  Medication Sig  . allopurinol (ZYLOPRIM) 300 MG tablet Take 1  tablet (300 mg total) by mouth daily.  [name] aspirin 81 MG tablet Take 1 tablet (81 mg total) by mouth daily.  [name] atorvastatin (LIPITOR) 20 MG tablet Take 1 tablet (20 mg total) by mouth daily.  . brimonidine (ALPHAGAN) 0.2 % ophthalmic solution 1 drop 2 (two) times daily.  . carvedilol (COREG) 25 MG tablet Take 25 mg by mouth 2 (two) times daily with a meal.   . dorzolamide-timolol (COSOPT) 22.3-6.8 MG/ML ophthalmic solution 1 drop 2 (two) times daily.  . DUREZOL 0.05 % EMUL Place 1 drop into the left eye 4 (four) times daily.  [name] FIBER SUPPLEMENT PO Take 2 tablets by mouth daily.   . hydrALAZINE (APRESOLINE) 100 MG tablet Take 1 tablet (100 mg total) by mouth 3 (three) times daily.  . potassium chloride SA (K-DUR) 20 MEQ tablet Take 1 tablet (20 mEq total) by mouth 2 (two) times daily.  . sildenafil (VIAGRA) 25 MG tablet Take 1 tablet (25 mg total) by mouth daily as needed for erectile dysfunction.  [name] spironolactone (ALDACTONE) 25 MG tablet Take 25 mg by mouth 2 (two) times daily.  [name] torsemide (DEMADEX) 20 MG tablet TAKE 2 TABLETS(40 MG) BY MOUTH TWICE DAILY  . fluticasone (FLONASE) 50 MCG/ACT nasal spray Place 2 sprays into both nostrils daily.  . predniSONE (DELTASONE) 20 MG tablet 2 tabs po daily for 5 days, then 1 tab po daily for 5 days  . [DISCONTINUED] diclofenac sodium (VOLTAREN) 1 % GEL Apply 2 g topically 4 (four) times daily. Rub into affected area of foot 2 to 4 times daily (Patient taking differently:  Apply 2 g topically 4 (four) times daily. As needed Rub into affected area of foot 2 to 4 times daily)  . [DISCONTINUED] fluticasone (FLONASE) 50 MCG/ACT nasal spray Place 2 sprays into both nostrils daily. (Patient taking differently: Place 2 sprays into both nostrils daily as needed for allergies. )  . [DISCONTINUED] potassium chloride SA (K-DUR,KLOR-CON) 20 MEQ tablet Take 1 tablet (20 mEq total) by mouth daily.   No facility-administered encounter medications on file as of 04/02/2019.

## 2019-04-02 NOTE — Patient Instructions (Addendum)
Eustachian Tube Dysfunction:   This is the blocked up tube that runs from behind your ears to your sinuses.   There is a tube that connects between the sinuses and behind the ear called the "eustachian tube." Sometimes when you have allergies, a cold, or nasal congestion for any reason this tube can get blocked and pressure cannot equalize in your ears. (Like if you swim in deep water) This can also trap fluid behind the ear and give you a full, pressure-like sensation that is uncomfortable, but it is not an ear infection.  Recommendations: Anti-Histamine: Allegra, Zyrtec, or Claritin. All over the counter now and once a day.  Nasal steroid. Nasacort is over the counter now. About 10 prescription ones exist.  I also sent you in some nasal steroids    Tinnitus Tinnitus refers to hearing a sound when there is no actual source for that sound. This is often described as ringing in the ears. However, people with this condition may hear a variety of noises, in one ear or in both ears. The sounds of tinnitus can be soft, loud, or somewhere in between. Tinnitus can last for a few seconds or can be constant for days. It may go away without treatment and come back at various times. When tinnitus is constant or happens often, it can lead to other problems, such as trouble sleeping and trouble concentrating. Almost everyone experiences tinnitus at some point. Tinnitus that is long-lasting (chronic) or comes back often (recurs) may require medical attention. What are the causes? The cause of tinnitus is often not known. In some cases, it can result from:  Exposure to loud noises from machinery, music, or other sources.  An object (foreign body) stuck in the ear.  Earwax buildup.  Drinking alcohol or caffeine.  Taking certain medicines.  Age-related hearing loss. It may also be caused by medical conditions such as:  Ear or sinus infections.  High blood pressure.  Heart diseases.  Anemia.   Allergies.  Meniere's disease.  Thyroid problems.  Tumors.  A weak, bulging blood vessel (aneurysm) near the ear. What are the signs or symptoms? The main symptom of tinnitus is hearing a sound when there is no source for that sound. It may sound like:  Buzzing.  Roaring.  Ringing.  Blowing air.  Hissing.  Whistling.  Sizzling.  Humming.  Running water.  A musical note.  Tapping. Symptoms may affect only one ear (unilateral) or both ears (bilateral). How is this diagnosed? Tinnitus is diagnosed based on your symptoms, your medical history, and a physical exam. Your health care provider may do a thorough hearing test (audiologic exam) if your tinnitus:  Is unilateral.  Causes hearing difficulties.  Lasts 6 months or longer. You may work with a health care provider who specializes in hearing disorders (audiologist). You may be asked questions about your symptoms and how they affect your daily life. You may have other tests done, such as:  CT scan.  MRI.  An imaging test of how blood flows through your blood vessels (angiogram). How is this treated? Treating an underlying medical condition can sometimes make tinnitus go away. If your tinnitus continues, other treatments may include:  Medicines.  Therapy and counseling to help you manage the stress of living with tinnitus.  Sound generators to mask the tinnitus. These include: ? Tabletop sound machines that play relaxing sounds to help you fall asleep. ? Wearable devices that fit in your ear and play sounds or music. ? Acoustic  neural stimulation. This involves using headphones to listen to music that contains an auditory signal. Over time, listening to this signal may change some pathways in your brain and make you less sensitive to tinnitus. This treatment is used for very severe cases when no other treatment is working.  Using hearing aids or cochlear implants if your tinnitus is related to hearing loss.  Hearing aids are worn in the outer ear. Cochlear implants are surgically placed in the inner ear. Follow these instructions at home: Managing symptoms      When possible, avoid being in loud places and being exposed to loud sounds.  Wear hearing protection, such as earplugs, when you are exposed to loud noises.  Use a white noise machine, a humidifier, or other devices to mask the sound of tinnitus.  Practice techniques for reducing stress, such as meditation, yoga, or deep breathing. Work with your health care provider if you need help with managing stress.  Sleep with your head slightly raised. This may reduce the impact of tinnitus. General instructions  Do not use stimulants, such as nicotine, alcohol, or caffeine. Talk with your health care provider about other stimulants to avoid. Stimulants are substances that can make you feel alert and attentive by increasing certain activities in the body (such as heart rate and blood pressure). These substances may make tinnitus worse.  Take over-the-counter and prescription medicines only as told by your health care provider.  Try to get plenty of sleep each night.  Keep all follow-up visits as told by your health care provider. This is important. Contact a health care provider if:  Your tinnitus continues for 3 weeks or longer without stopping.  You develop sudden hearing loss.  Your symptoms get worse or do not get better with home care.  You feel you are not able to manage the stress of living with tinnitus. Get help right away if:  You develop tinnitus after a head injury.  You have tinnitus along with any of the following: ? Dizziness. ? Loss of balance. ? Nausea and vomiting. ? Sudden, severe headache. These symptoms may represent a serious problem that is an emergency. Do not wait to see if the symptoms will go away. Get medical help right away. Call your local emergency services (911 in the [place] Do not drive yourself  to the hospital. Summary  Tinnitus refers to hearing a sound when there is no actual source for that sound. This is often described as ringing in the ears.  Symptoms may affect only one ear (unilateral) or both ears (bilateral).  Use a white noise machine, a humidifier, or other devices to mask the sound of tinnitus.  Do not use stimulants, such as nicotine, alcohol, or caffeine. Talk with your health care provider about other stimulants to avoid. These substances may make tinnitus worse. This information is not intended to replace advice given to you by your health care provider. Make sure you discuss any questions you have with your health care provider. Document Revised: 07/17/2018 Document Reviewed: 10/12/2016 Elsevier Patient Education  2020 [employer].

## 2019-04-07 ENCOUNTER — Other Ambulatory Visit: Payer: Self-pay

## 2019-04-07 DIAGNOSIS — M1039 Gout due to renal impairment, multiple sites: Secondary | ICD-10-CM

## 2019-04-07 MED ORDER — ALLOPURINOL 300 MG PO TABS: 300.0000 mg | ORAL_TABLET | Freq: Every day | ORAL | 0 refills | Status: DC

## 2019-04-17 DIAGNOSIS — J189 Pneumonia, unspecified organism: Secondary | ICD-10-CM

## 2019-04-17 HISTORY — DX: Pneumonia, unspecified organism: J18.9

## 2019-04-26 ENCOUNTER — Encounter (HOSPITAL_BASED_OUTPATIENT_CLINIC_OR_DEPARTMENT_OTHER): Payer: Self-pay | Admitting: Emergency Medicine

## 2019-04-26 ENCOUNTER — Other Ambulatory Visit: Payer: Self-pay

## 2019-04-26 ENCOUNTER — Emergency Department (HOSPITAL_BASED_OUTPATIENT_CLINIC_OR_DEPARTMENT_OTHER)
Admission: EM | Admit: 2019-04-26 | Discharge: 2019-04-26 | Disposition: A | Discharge status: Home / Self Care | Payer: Managed Care, Other (non HMO) | Attending: Emergency Medicine | Admitting: Emergency Medicine

## 2019-04-26 ENCOUNTER — Emergency Department (HOSPITAL_BASED_OUTPATIENT_CLINIC_OR_DEPARTMENT_OTHER): Payer: Managed Care, Other (non HMO)

## 2019-04-26 DIAGNOSIS — E785 Hyperlipidemia, unspecified: Secondary | ICD-10-CM | POA: Insufficient documentation

## 2019-04-26 DIAGNOSIS — I130 Hypertensive heart and chronic kidney disease with heart failure and stage 1 through stage 4 chronic kidney disease, or unspecified chronic kidney disease: Secondary | ICD-10-CM | POA: Diagnosis not present

## 2019-04-26 DIAGNOSIS — I10 Essential (primary) hypertension: Secondary | ICD-10-CM

## 2019-04-26 DIAGNOSIS — Z8673 Personal history of transient ischemic attack (TIA), and cerebral infarction without residual deficits: Secondary | ICD-10-CM | POA: Diagnosis not present

## 2019-04-26 DIAGNOSIS — N183 Chronic kidney disease, stage 3 unspecified: Secondary | ICD-10-CM | POA: Insufficient documentation

## 2019-04-26 DIAGNOSIS — Z79899 Other long term (current) drug therapy: Secondary | ICD-10-CM | POA: Diagnosis not present

## 2019-04-26 DIAGNOSIS — I509 Heart failure, unspecified: Secondary | ICD-10-CM | POA: Diagnosis not present

## 2019-04-26 DIAGNOSIS — R0602 Shortness of breath: Secondary | ICD-10-CM | POA: Diagnosis present

## 2019-04-26 DIAGNOSIS — U071 COVID-19: Secondary | ICD-10-CM

## 2019-04-26 LAB — CBC WITH DIFFERENTIAL/PLATELET
Abs Immature Granulocytes: 0.02 10*3/uL (ref 0.00–0.07)
Basophils Absolute: 0 10*3/uL (ref 0.0–0.1)
Basophils Relative: 0 %
Eosinophils Absolute: 0.2 10*3/uL (ref 0.0–0.5)
Eosinophils Relative: 3 %
HCT: 40.8 % (ref 39.0–52.0)
Hemoglobin: 12.3 g/dL — ABNORMAL LOW (ref 13.0–17.0)
Immature Granulocytes: 0 %
Lymphocytes Relative: 12 %
Lymphs Abs: 0.7 10*3/uL (ref 0.7–4.0)
MCH: 22.4 pg — ABNORMAL LOW (ref 26.0–34.0)
MCHC: 30.1 g/dL (ref 30.0–36.0)
MCV: 74.5 fL — ABNORMAL LOW (ref 80.0–100.0)
Monocytes Absolute: 1.2 10*3/uL — ABNORMAL HIGH (ref 0.1–1.0)
Monocytes Relative: 20 %
Neutro Abs: 3.7 10*3/uL (ref 1.7–7.7)
Neutrophils Relative %: 65 %
Platelets: 228 10*3/uL (ref 150–400)
RBC: 5.48 MIL/uL (ref 4.22–5.81)
RDW: 19.1 % — ABNORMAL HIGH (ref 11.5–15.5)
WBC: 5.8 10*3/uL (ref 4.0–10.5)
nRBC: 0 % (ref 0.0–0.2)

## 2019-04-26 LAB — SARS CORONAVIRUS 2 AG (30 MIN TAT): SARS Coronavirus 2 Ag: POSITIVE — AB

## 2019-04-26 LAB — BRAIN NATRIURETIC PEPTIDE: B Natriuretic Peptide: 170.2 pg/mL — ABNORMAL HIGH (ref 0.0–100.0)

## 2019-04-26 LAB — BASIC METABOLIC PANEL
Anion gap: 12 (ref 5–15)
BUN: 59 mg/dL — ABNORMAL HIGH (ref 6–20)
CO2: 22 mmol/L (ref 22–32)
Calcium: 8.4 mg/dL — ABNORMAL LOW (ref 8.9–10.3)
Chloride: 105 mmol/L (ref 98–111)
Creatinine, Ser: 5.08 mg/dL — ABNORMAL HIGH (ref 0.61–1.24)
GFR calc Af Amer: 15 mL/min — ABNORMAL LOW (ref >60)
GFR calc non Af Amer: 13 mL/min — ABNORMAL LOW (ref >60)
Glucose, Bld: 97 mg/dL (ref 70–99)
Potassium: 4.3 mmol/L (ref 3.5–5.1)
Sodium: 139 mmol/L (ref 135–145)

## 2019-04-26 LAB — TROPONIN I (HIGH SENSITIVITY)
Troponin I (High Sensitivity): 139 ng/L (ref <18)
Troponin I (High Sensitivity): 148 ng/L (ref <18)

## 2019-04-26 MED ORDER — HYDRALAZINE HCL 20 MG/ML IJ SOLN
10.0000 mg | Freq: Once | INTRAMUSCULAR | Status: AC
  Administered 2019-04-26: 10 mg via INTRAVENOUS
  Filled 2019-04-26: qty 1

## 2019-04-26 MED ORDER — BENZONATATE 100 MG PO CAPS
100.0000 mg | ORAL_CAPSULE | Freq: Once | ORAL | Status: AC
  Administered 2019-04-26: 100 mg via ORAL
  Filled 2019-04-26: qty 1

## 2019-04-26 MED ORDER — SODIUM CHLORIDE 0.9 % IV BOLUS
500.0000 mL | Freq: Once | INTRAVENOUS | Status: AC
  Administered 2019-04-26: 500 mL via INTRAVENOUS

## 2019-04-26 NOTE — ED Notes (Signed)
COVID positive, ED MD and Joss RN informed

## 2019-04-26 NOTE — Discharge Instructions (Signed)
As we discussed, your work-up did reveal that you are Covid positive.  You will need to quarantine.  Since we do not know with your date of exposure, you will need to quarantine for 14 days.  Additionally, your blood pressure was elevated.  This is most likely due to not taking her medications.  Additionally, as we discussed today, your kidney function was slightly worse.  Make sure you are staying hydrated and drinking plenty of fluids.  With COVID-19, he should expect to have generalized body aches, cough, congestion.  You may also have some fever.  Return the emergency department for any difficulty breathing, persistent vomiting/unable to keep any food down, chest pain or any other worsening or concerning symptoms.

## 2019-04-26 NOTE — ED Notes (Signed)
Troponin 139, results given to PA and Joss RN

## 2019-04-26 NOTE — ED Triage Notes (Signed)
SOB since yesterday, worse when trying to sleep. O2 sats 94% while ambulating to rm 11.

## 2019-04-26 NOTE — ED Notes (Signed)
Pt ambulated in room without difficulty. O2 sats 94-97% on room air. States he feels more SOB when sitting on stretcher

## 2019-04-26 NOTE — ED Provider Notes (Signed)
[facility] EMERGENCY DEPARTMENT Provider Note   CSN: [id number] Arrival date & time: 04/26/19  1438     History Chief Complaint  Patient presents with  . Shortness of Breath    Frank Coffey is a 39 y.o. male past medical history of CHF, hypertension, hyperlipidemia, sleep apnea who presents for evaluation of congestion, decreased smell, fatigue, cough, shortness of breath.  He states that symptoms began yesterday while on his way driving home from work.  He states that he felt some congestion in his nose and felt like his nostrils were burning.  He states that throughout the night, he felt congestion in his chest and tried to cough it up.  He states cough is nonproductive.  He states that today, he still feels like he has a lot of congestion in his chest.  He reports last night he had some shortness of breath while trying to sleep.  He does report that he has sleep apnea and does state that he commonly has slight shortness of breath while sleeping.  Patient states that the shortness of breath feels better today but he came because he feels like he is still congested and lost his sense of smell.  He has not noted any fevers. No chest pain with deep inspiration. He does report that he recently traveled to [place].  No known sick contacts.  No known COVID-19 exposure.  He does report that he has a history of CHF about 20 years ago secondary to a virus.  He has not had any issues with CHF since then.  He had had some bilateral leg swelling about 2 weeks ago but took a fluid pill which helped it.  No asymmetric swelling.  He does not take any exogenous hormones, denies any history of blood clots in his legs or his lungs, history of surgery, recent admission.  The history is provided by the patient.       Past Medical History:  Diagnosis Date  . CHF (congestive heart failure) ([facility])   . Chronic kidney disease, stage 3, mod decreased GFR   . Gout   . 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 Hx of  migraine headaches   . Hyperlipidemia   . Hypertension   . Hypertensive heart disease with congestive heart failure and stage 3 kidney disease ([facility])   . Obesity   . OSA (obstructive sleep apnea)     Patient Active Problem List   Diagnosis Date Noted  . Mobitz type 1 second degree atrioventricular block 11/18/2017  . TIA (transient ischemic attack) 11/14/2017  . Hypertensive urgency 11/14/2017  . Migraine 10/02/2016  . Erectile dysfunction 10/02/2016  . Annual physical exam 02/22/2016  . Onychomycosis 02/22/2016  . Pre-operative cardiovascular examination 07/19/2015  . NICM (nonischemic cardiomyopathy) ([place]) 07/19/2015  . Morbid obesity due to excess calories ([employer]) 07/19/2015  . 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 CHF (congestive heart failure) ([facility]) 12/19/2012  . Benign hypertension with chronic kidney disease, stage IV ([facility]) 12/19/2012  . Gout 12/19/2012  . CKD (chronic kidney disease) stage 4, GFR 15-29 ml/min ([facility]) 12/19/2012  . OSA (obstructive sleep apnea) 12/19/2012    Past Surgical History:  Procedure Laterality Date  . CHOLECYSTECTOMY    . RETINAL DETACHMENT REPAIR W/ SCLERAL BUCKLE LE     Left       Family History  Problem Relation Age of Onset  . Hypertension Mother   . Hyperlipidemia Mother   . Heart disease Mother   . Hypertension Maternal Grandmother   .  Stroke Maternal Grandmother   . Heart disease Maternal Grandmother   . Hyperlipidemia Maternal Grandmother   . Stroke Maternal Grandfather   . Hypertension Maternal Grandfather   . Heart disease Maternal Grandfather   . Hyperlipidemia Maternal Grandfather   . Alzheimer's disease Maternal Grandfather   . Cancer - Other Paternal Grandmother   . Healthy Brother        x2  . Healthy Sister        x2  . Healthy Son        x2  . Healthy Daughter        x1  . Diabetes Neg Hx   . Heart attack Neg Hx   . Sudden death Neg Hx     Social History   Tobacco Use  . Smoking status: Never  Smoker  . Smokeless tobacco: Never Used  Substance Use Topics  . Alcohol use: Yes    Comment: socially -- once every few months  . Drug use: No    Home Medications Prior to Admission medications   Medication Sig Start Date End Date Taking? Authorizing Provider  allopurinol (ZYLOPRIM) 300 MG tablet Take 1 tablet (300 mg total) by mouth daily. 04/07/19  Yes Elby Beck, FNP  aspirin 81 MG tablet Take 1 tablet (81 mg total) by mouth daily. 11/16/17  Yes Samuella Cota, MD  atorvastatin (LIPITOR) 20 MG tablet Take 1 tablet (20 mg total) by mouth daily. 05/29/18  Yes Elby Beck, FNP  brimonidine (ALPHAGAN) 0.2 % ophthalmic solution 1 drop 2 (two) times daily. 03/19/19  Yes [provider]  carvedilol (COREG) 25 MG tablet Take 25 mg by mouth 2 (two) times daily with a meal.  11/20/17  Yes [provider]  dorzolamide-timolol (COSOPT) 22.3-6.8 MG/ML ophthalmic solution 1 drop 2 (two) times daily. 03/10/19  Yes [provider]  DUREZOL 0.05 % EMUL Place 1 drop into the left eye 4 (four) times daily. 03/10/19  Yes [provider]  EQ FIBER SUPPLEMENT PO Take 2 tablets by mouth daily.    Yes [provider]  fluticasone (FLONASE) 50 MCG/ACT nasal spray Place 2 sprays into both nostrils daily. 04/02/19  Yes Copland, Frederico Hamman, MD  hydrALAZINE (APRESOLINE) 100 MG tablet Take 1 tablet (100 mg total) by mouth 3 (three) times daily. 11/16/17  Yes Samuella Cota, MD  potassium chloride SA (K-DUR) 20 MEQ tablet Take 1 tablet (20 mEq total) by mouth 2 (two) times daily. 09/03/18  Yes Horton, Barbette Hair, MD  predniSONE (DELTASONE) 20 MG tablet 2 tabs po daily for 5 days, then 1 tab po daily for 5 days 04/02/19  Yes Copland, Frederico Hamman, MD  sildenafil (VIAGRA) 25 MG tablet Take 1 tablet (25 mg total) by mouth daily as needed for erectile dysfunction. 10/02/16  Yes Brunetta Jeans, PA-C  spironolactone (ALDACTONE) 25 MG tablet Take 25 mg by mouth 2 (two) times  daily.   Yes [provider]  torsemide (DEMADEX) 20 MG tablet TAKE 2 TABLETS(40 MG) BY MOUTH TWICE DAILY 09/16/18  Yes Elby Beck, FNP    Allergies    Patient has no known allergies.  Review of Systems   Review of Systems  Constitutional: Positive for fatigue. Negative for fever.  HENT: Positive for congestion.   Respiratory: Positive for cough and shortness of breath.   Cardiovascular: Negative for chest pain.  Gastrointestinal: Negative for abdominal pain, nausea and vomiting.  Genitourinary: Negative for dysuria and hematuria.  Neurological: Negative for weakness,  numbness and headaches.  All other systems reviewed and are negative.   Physical Exam Updated Vital Signs BP (!) 164/119 (BP Location: Right Arm)   Pulse 92   Temp 98.7 F (37.1 C) (Oral)   Resp 18   Ht 5\' 8"  (1.727 m)   Wt (!) 164.2 kg   SpO2 97%   BMI 55.04 kg/m   Physical Exam Vitals and nursing note reviewed.  Constitutional:      Appearance: Normal appearance. He is well-developed.  HENT:     Head: Normocephalic and atraumatic.  Eyes:     General: Lids are normal.     Conjunctiva/sclera: Conjunctivae normal.     Pupils: Pupils are equal, round, and reactive to light.  Cardiovascular:     Rate and Rhythm: Normal rate and regular rhythm.     Pulses: Normal pulses.     Heart sounds: Normal heart sounds. No murmur. No friction rub. No gallop.   Pulmonary:     Effort: Pulmonary effort is normal.     Breath sounds: Normal breath sounds.     Comments: Exam limited to body habitus.  No obvious rales.  No evidence of respiratory distress.  Speaking full sentences without any difficulty. Abdominal:     Palpations: Abdomen is soft. Abdomen is not rigid.     Tenderness: There is no abdominal tenderness. There is no guarding.     Comments: Abdomen is soft, non-distended, non-tender. No rigidity, No guarding. No peritoneal signs.  Musculoskeletal:        General: Normal range of motion.      Cervical back: Full passive range of motion without pain.     Comments: Bilateral lower extremities are symmetric in appearance without any overlying warmth, erythema, edema.  Skin:    General: Skin is warm and dry.     Capillary Refill: Capillary refill takes less than 2 seconds.  Neurological:     Mental Status: He is alert and oriented to person, place, and time.  Psychiatric:        Speech: Speech normal.     ED Results / Procedures / Treatments   Labs (all labs ordered are listed, but only abnormal results are displayed) Labs Reviewed  SARS CORONAVIRUS 2 AG (30 MIN TAT) - Abnormal; Notable for the following components:      Result Value   SARS Coronavirus 2 Ag POSITIVE (*)    All other components within normal limits  BASIC METABOLIC PANEL - Abnormal; Notable for the following components:   BUN 59 (*)    Creatinine, Ser 5.08 (*)    Calcium 8.4 (*)    GFR calc non Af Amer 13 (*)    GFR calc Af Amer 15 (*)    All other components within normal limits  CBC WITH DIFFERENTIAL/PLATELET - Abnormal; Notable for the following components:   Hemoglobin 12.3 (*)    MCV 74.5 (*)    MCH 22.4 (*)    RDW 19.1 (*)    Monocytes Absolute 1.2 (*)    All other components within normal limits  BRAIN NATRIURETIC PEPTIDE - Abnormal; Notable for the following components:   B Natriuretic Peptide 170.2 (*)    All other components within normal limits  TROPONIN I (HIGH SENSITIVITY) - Abnormal; Notable for the following components:   Troponin I (High Sensitivity) 139 (*)    All other components within normal limits  TROPONIN I (HIGH SENSITIVITY) - Abnormal; Notable for the following components:   Troponin I (High Sensitivity)  148 (*)    All other components within normal limits    EKG EKG Interpretation  Date/Time:  Saturday April 26 2019 15:00:10 EDT Ventricular Rate:  87 PR Interval:    QRS Duration: 116 QT Interval:  422 QTC Calculation: 508 R Axis:   18 Text Interpretation: Sinus  rhythm Probable left atrial enlargement Left ventricular hypertrophy Abnormal T, consider ischemia, lateral leads ST elevation, consider anterior injury Prolonged QT interval No significant change since last tracing Confirmed by Theotis Burrow [phone]) on 04/26/2019 3:05:19 PM   Radiology DG Chest Portable 1 View  Result Date: 04/26/2019 CLINICAL DATA:  Shortness of breath, hypoxia EXAM: PORTABLE CHEST 1 VIEW COMPARISON:  10/09/2018 FINDINGS: Lungs are clear.  No pleural effusion or pneumothorax. Cardiomegaly. IMPRESSION: No evidence of acute cardiopulmonary disease. Electronically Signed   By: Julian Hy M.D.   On: 04/26/2019 15:21    Procedures Procedures (including critical care time)  Medications Ordered in ED Medications  sodium chloride 0.9 % bolus 500 mL ( Intravenous Stopped 04/26/19 2017)  hydrALAZINE (APRESOLINE) injection 10 mg (10 mg Intravenous Given 04/26/19 1939)  benzonatate (TESSALON) capsule 100 mg (100 mg Oral Given 04/26/19 2019)    ED Course  I have reviewed the triage vital signs and the nursing notes.  Pertinent labs & imaging results that were available during my care of the patient were reviewed by me and considered in my medical decision making (see chart for details).    MDM Rules/Calculators/A&P                      39 year old male who presents for evaluation of cough, congestion, shortness of breath that began yesterday.  He states that shortness of breath was worse last night when he was trying to sleep.  Does report history of sleep apnea.  States shortness of breath has gotten better today but mainly came because he feels congested.  Recent history of traveling to [place].  No known sick contacts.  On initial ED arrival, he is afebrile, nontoxic-appearing.  He is slightly hypertensive.  He does have hypertension.  O2 sats are 97% on room air.  No evidence of tachycardia.  Consider infectious etiology/COVID-19.  Also consider CHF given his history.   History/physical exam does not sound concerning for ACS etiology.  Additionally, do not suspect PE as he is mostly complaining of infectious symptoms such as congestion, cough.  He did recently travel but has no other PE risk factors.  Additionally, he is not complaining of any pleuritic chest pain.  He currently denies any shortness of breath now.  His symptoms were described as congestion.  BNP slightly elevated 170.2.  Troponin is slightly elevated 134.  I reviewed his records.  He has history of elevations troponin.  In September 2020, he had elevations in the 80s.  BMP shows BUN of 59, creatinine of 5.08.  Review of his records does show that is slightly worse than baseline.  CBC shows no leukocytosis.  Hemoglobin is 12.3.  He is Covid positive.  Chest x-ray shows no evidence of pneumonia.  At this time, patient states he feels congested in his chest.  Denies any difficulty breathing or chest pain here in the ED at this time.  His blood pressure is slightly elevated.  He does report that he has not taken his medication today.  I suspect this may be contributing to his high blood pressures.  Additionally, his kidney function is slightly worse than baseline.  His  usual baseline is around 4.8.  Patient given fluids.  I suspect his labs are secondary to ongoing Covid infection.  History/physical exam not concerning for hypertensive emergency.  Additionally, history/physical exam not concerning for dissection.  Delta troponin is 148.   I discussed results with patient.  He is sitting comfortably in bed.  We ambulated patient in the ED and he maintain sats between 94-97.  Patient states his main concern is cough and congestion.  I did discuss with patient regarding his work-up.  We discussed admission versus discharge.  We engaged in shared decision making and discussed risk versus benefits.  After extensive discussion, patient states that he would rather go home.  Given his reassuring work-up, I feel that  this is reasonable.  No indication for acute emergent admission at this time.  I discussed with Dr. Rex Kras who is agreeable to plan. At this time, patient exhibits no emergent life-threatening condition that require further evaluation in ED. Patient had ample opportunity for questions and discussion. All patient's questions were answered with full understanding. Strict return precautions discussed. Patient expresses understanding and agreement to plan.   Frank Coffey was evaluated in Emergency Department on 04/28/2019 for the symptoms described in the history of present illness. He was evaluated in the context of the global COVID-19 pandemic, which necessitated consideration that the patient might be at risk for infection with the SARS-CoV-2 virus that causes COVID-19. Institutional protocols and algorithms that pertain to the evaluation of patients at risk for COVID-19 are in a state of rapid change based on information released by regulatory bodies including the CDC and federal and state organizations. These policies and algorithms were followed during the patient's care in the ED.  Portions of this note were generated with [employer]. Dictation errors may occur despite best attempts at proofreading.   Final Clinical Impression(s) / ED Diagnoses Final diagnoses:  COVID-19  Hypertension, unspecified type    Rx / DC Orders ED Discharge Orders    None       Desma Mcgregor 04/28/19 0022    Little, Wenda Overland, MD 05/01/19 [phone]

## 2019-04-26 NOTE — ED Notes (Signed)
X-ray at bedside

## 2019-04-27 ENCOUNTER — Telehealth (HOSPITAL_COMMUNITY): Payer: Self-pay | Admitting: Nurse Practitioner

## 2019-04-27 ENCOUNTER — Other Ambulatory Visit (HOSPITAL_COMMUNITY): Payer: Self-pay | Admitting: Nurse Practitioner

## 2019-04-27 DIAGNOSIS — U071 COVID-19: Secondary | ICD-10-CM

## 2019-04-27 NOTE — Progress Notes (Signed)
I connected by phone with Frank Coffey on 04/27/2019 at 9:04 AM to discuss the potential use of an new treatment for mild to moderate COVID-19 viral infection in non-hospitalized patients.  This patient is a 39 y.o. male that meets the FDA criteria for Emergency Use Authorization of bamlanivimab or casirivimab\imdevimab.  Has a (+) direct SARS-CoV-2 viral test result  Has mild or moderate COVID-19   Is ? 41 years of age and weighs ? 40 kg  Is NOT hospitalized due to COVID-19  Is NOT requiring oxygen therapy or requiring an increase in baseline oxygen flow rate due to COVID-19  Is within 10 days of symptom onset  Has at least one of the high risk factor(s) for progression to severe COVID-19 and/or hospitalization as defined in EUA.  Specific high risk criteria : BMI >/= 35   I have spoken and communicated the following to the patient or parent/caregiver:  1. FDA has authorized the emergency use of bamlanivimab and casirivimab\imdevimab for the treatment of mild to moderate COVID-19 in adults and pediatric patients with positive results of direct SARS-CoV-2 viral testing who are 39 years of age and older weighing at least 40 kg, and who are at high risk for progressing to severe COVID-19 and/or hospitalization.  2. The significant known and potential risks and benefits of bamlanivimab and casirivimab\imdevimab, and the extent to which such potential risks and benefits are unknown.  3. Information on available alternative treatments and the risks and benefits of those alternatives, including clinical trials.  4. Patients treated with bamlanivimab and casirivimab\imdevimab should continue to self-isolate and use infection control measures (e.g., wear mask, isolate, social distance, avoid sharing personal items, clean and disinfect "high touch" surfaces, and frequent handwashing) according to CDC guidelines.   5. The patient or parent/caregiver has the option to accept or refuse  bamlanivimab or casirivimab\imdevimab .  After reviewing this information with the patient,the patient agreed to proceed with receiving the bamlanivimab-etesevimab and will be provided a copy of the fact sheet prior to receiving the infusion.   Beckey Rutter, [place], AGNP-C [phone] ([place])

## 2019-04-27 NOTE — Telephone Encounter (Signed)
Called to Discuss with patient about Covid symptoms and the use of bamlanivimab, a monoclonal antibody infusion for those with mild to moderate Covid symptoms and at a high risk of hospitalization.     Pt is qualified for this infusion at the [facility] infusion center due to co-morbid conditions and/or a member of an at-risk group.     Symptoms started 4/8 and have persisted since that time. Qualifies for mab based on bmi. See orders encounter for details. Patient has received first vaccine. Discussed mab and impact for second dose.   Beckey Rutter, [place], AGNP-C [phone] ([place])

## 2019-04-28 ENCOUNTER — Ambulatory Visit (HOSPITAL_COMMUNITY)
Admission: RE | Admit: 2019-04-28 | Discharge: 2019-04-28 | Disposition: A | Discharge status: Home / Self Care | Payer: Managed Care, Other (non HMO) | Source: Ambulatory Visit | Attending: Pulmonary Disease | Admitting: Pulmonary Disease

## 2019-04-28 ENCOUNTER — Telehealth: Payer: Self-pay | Admitting: Family Medicine

## 2019-04-28 DIAGNOSIS — R05 Cough: Secondary | ICD-10-CM | POA: Diagnosis not present

## 2019-04-28 DIAGNOSIS — U071 COVID-19: Secondary | ICD-10-CM

## 2019-04-28 MED ORDER — DIPHENHYDRAMINE HCL 50 MG/ML IJ SOLN: 50.0000 mg | Freq: Once | INTRAMUSCULAR | Status: DC | PRN

## 2019-04-28 MED ORDER — EPINEPHRINE 0.3 MG/0.3ML IJ SOAJ: 0.3000 mg | Freq: Once | INTRAMUSCULAR | Status: DC | PRN

## 2019-04-28 MED ORDER — ALBUTEROL SULFATE HFA 108 (90 BASE) MCG/ACT IN AERS: 2.0000 | INHALATION_SPRAY | Freq: Once | RESPIRATORY_TRACT | Status: DC | PRN

## 2019-04-28 MED ORDER — METHYLPREDNISOLONE SODIUM SUCC 125 MG IJ SOLR: 125.0000 mg | Freq: Once | INTRAMUSCULAR | Status: DC | PRN

## 2019-04-28 MED ORDER — ONDANSETRON HCL 4 MG/2ML IJ SOLN
4.0000 mg | Freq: Once | INTRAMUSCULAR | Status: DC
  Filled 2019-04-28: qty 2

## 2019-04-28 MED ORDER — SODIUM CHLORIDE 0.9 % IV SOLN: INTRAVENOUS | Status: DC | PRN

## 2019-04-28 MED ORDER — SODIUM CHLORIDE 0.9 % IV SOLN: Freq: Once | INTRAVENOUS | Status: AC

## 2019-04-28 MED ORDER — SODIUM CHLORIDE 0.9 % IV SOLN
Freq: Once | INTRAVENOUS | Status: AC
  Filled 2019-04-28: qty 700

## 2019-04-28 MED ORDER — FAMOTIDINE IN NACL 20-0.9 MG/50ML-% IV SOLN: 20.0000 mg | Freq: Once | INTRAVENOUS | Status: DC | PRN

## 2019-04-28 NOTE — Progress Notes (Signed)
  Diagnosis: COVID-19  Physician:wright  Procedure: Covid Infusion Clinic Med: bamlanivimab\etesevimab infusion - Provided patient with bamlanimivab\etesevimab fact sheet for patients, parents and caregivers prior to infusion.  Complications: No immediate complications noted.  Discharge: Discharged home   Heide Scales 04/28/2019

## 2019-04-28 NOTE — Discharge Instructions (Signed)

## 2019-04-28 NOTE — Telephone Encounter (Signed)
Patient positive for Covid 19 over the weekend. Please add him to follow up list.

## 2019-04-28 NOTE — Progress Notes (Addendum)
After 15 minutes of infusion patient c/o nausea and headache.  Infusion stopped immediately.Denies pain or SOB. See vitals on flowsheet.Zofran given, nausea relieved. Patient's Bp and heart rate down from baseline. Alert and oriented. Dr Joya Gaskins notified,ordered to give patient ivf bolus and re check vitals.   @1347  patient states he is feeling better. vss at baseline.Dr Joya Gaskins made aware, ordered to restart infusion at a lower rate and have zofran at the bedside.   @1550  vss. Dc intstructions reviewed with patient. No complaints. Patient completed infusion at 1430

## 2019-04-30 ENCOUNTER — Other Ambulatory Visit: Payer: Self-pay

## 2019-04-30 ENCOUNTER — Encounter (HOSPITAL_BASED_OUTPATIENT_CLINIC_OR_DEPARTMENT_OTHER): Payer: Self-pay

## 2019-04-30 ENCOUNTER — Inpatient Hospital Stay (HOSPITAL_BASED_OUTPATIENT_CLINIC_OR_DEPARTMENT_OTHER)
Admission: EM | Admit: 2019-04-30 | Discharge: 2019-05-02 | DRG: 177 | Disposition: A | Discharge status: Home / Self Care | Payer: Managed Care, Other (non HMO) | Attending: Internal Medicine | Admitting: Internal Medicine

## 2019-04-30 ENCOUNTER — Emergency Department (HOSPITAL_BASED_OUTPATIENT_CLINIC_OR_DEPARTMENT_OTHER): Payer: Managed Care, Other (non HMO)

## 2019-04-30 DIAGNOSIS — E785 Hyperlipidemia, unspecified: Secondary | ICD-10-CM | POA: Diagnosis present

## 2019-04-30 DIAGNOSIS — M109 Gout, unspecified: Secondary | ICD-10-CM | POA: Diagnosis present

## 2019-04-30 DIAGNOSIS — Z23 Encounter for immunization: Secondary | ICD-10-CM | POA: Diagnosis not present

## 2019-04-30 DIAGNOSIS — Z7982 Long term (current) use of aspirin: Secondary | ICD-10-CM | POA: Diagnosis not present

## 2019-04-30 DIAGNOSIS — I248 Other forms of acute ischemic heart disease: Secondary | ICD-10-CM | POA: Diagnosis present

## 2019-04-30 DIAGNOSIS — R7989 Other specified abnormal findings of blood chemistry: Secondary | ICD-10-CM

## 2019-04-30 DIAGNOSIS — Z83438 Family history of other disorder of lipoprotein metabolism and other lipidemia: Secondary | ICD-10-CM

## 2019-04-30 DIAGNOSIS — Z8673 Personal history of transient ischemic attack (TIA), and cerebral infarction without residual deficits: Secondary | ICD-10-CM

## 2019-04-30 DIAGNOSIS — Z9049 Acquired absence of other specified parts of digestive tract: Secondary | ICD-10-CM | POA: Diagnosis not present

## 2019-04-30 DIAGNOSIS — G43909 Migraine, unspecified, not intractable, without status migrainosus: Secondary | ICD-10-CM | POA: Diagnosis present

## 2019-04-30 DIAGNOSIS — Z823 Family history of stroke: Secondary | ICD-10-CM | POA: Diagnosis not present

## 2019-04-30 DIAGNOSIS — J9601 Acute respiratory failure with hypoxia: Secondary | ICD-10-CM | POA: Diagnosis present

## 2019-04-30 DIAGNOSIS — I5022 Chronic systolic (congestive) heart failure: Secondary | ICD-10-CM | POA: Diagnosis present

## 2019-04-30 DIAGNOSIS — Z6841 Body Mass Index (BMI) 40.0 and over, adult: Secondary | ICD-10-CM

## 2019-04-30 DIAGNOSIS — I129 Hypertensive chronic kidney disease with stage 1 through stage 4 chronic kidney disease, or unspecified chronic kidney disease: Secondary | ICD-10-CM | POA: Diagnosis not present

## 2019-04-30 DIAGNOSIS — Z8249 Family history of ischemic heart disease and other diseases of the circulatory system: Secondary | ICD-10-CM | POA: Diagnosis not present

## 2019-04-30 DIAGNOSIS — R05 Cough: Secondary | ICD-10-CM | POA: Diagnosis present

## 2019-04-30 DIAGNOSIS — U071 COVID-19: Secondary | ICD-10-CM | POA: Diagnosis present

## 2019-04-30 DIAGNOSIS — N184 Chronic kidney disease, stage 4 (severe): Secondary | ICD-10-CM | POA: Diagnosis present

## 2019-04-30 DIAGNOSIS — Z79899 Other long term (current) drug therapy: Secondary | ICD-10-CM

## 2019-04-30 DIAGNOSIS — I130 Hypertensive heart and chronic kidney disease with heart failure and stage 1 through stage 4 chronic kidney disease, or unspecified chronic kidney disease: Secondary | ICD-10-CM | POA: Diagnosis present

## 2019-04-30 DIAGNOSIS — Z820 Family history of epilepsy and other diseases of the nervous system: Secondary | ICD-10-CM | POA: Diagnosis not present

## 2019-04-30 DIAGNOSIS — E1169 Type 2 diabetes mellitus with other specified complication: Secondary | ICD-10-CM

## 2019-04-30 DIAGNOSIS — G4733 Obstructive sleep apnea (adult) (pediatric): Secondary | ICD-10-CM | POA: Diagnosis present

## 2019-04-30 DIAGNOSIS — R778 Other specified abnormalities of plasma proteins: Secondary | ICD-10-CM | POA: Diagnosis not present

## 2019-04-30 DIAGNOSIS — N186 End stage renal disease: Secondary | ICD-10-CM | POA: Diagnosis present

## 2019-04-30 DIAGNOSIS — I132 Hypertensive heart and chronic kidney disease with heart failure and with stage 5 chronic kidney disease, or end stage renal disease: Secondary | ICD-10-CM | POA: Diagnosis present

## 2019-04-30 DIAGNOSIS — J1282 Pneumonia due to coronavirus disease 2019: Secondary | ICD-10-CM | POA: Diagnosis present

## 2019-04-30 DIAGNOSIS — R042 Hemoptysis: Secondary | ICD-10-CM

## 2019-04-30 DIAGNOSIS — Z992 Dependence on renal dialysis: Secondary | ICD-10-CM | POA: Diagnosis present

## 2019-04-30 LAB — COMPREHENSIVE METABOLIC PANEL
ALT: 35 U/L (ref 0–44)
AST: 38 U/L (ref 15–41)
Albumin: 3.1 g/dL — ABNORMAL LOW (ref 3.5–5.0)
Alkaline Phosphatase: 70 U/L (ref 38–126)
Anion gap: 10 (ref 5–15)
BUN: 48 mg/dL — ABNORMAL HIGH (ref 6–20)
CO2: 26 mmol/L (ref 22–32)
Calcium: 8.5 mg/dL — ABNORMAL LOW (ref 8.9–10.3)
Chloride: 101 mmol/L (ref 98–111)
Creatinine, Ser: 4.92 mg/dL — ABNORMAL HIGH (ref 0.61–1.24)
GFR calc Af Amer: 16 mL/min — ABNORMAL LOW (ref >60)
GFR calc non Af Amer: 14 mL/min — ABNORMAL LOW (ref >60)
Glucose, Bld: 98 mg/dL (ref 70–99)
Potassium: 4.3 mmol/L (ref 3.5–5.1)
Sodium: 137 mmol/L (ref 135–145)
Total Bilirubin: 0.8 mg/dL (ref 0.3–1.2)
Total Protein: 7.1 g/dL (ref 6.5–8.1)

## 2019-04-30 LAB — TROPONIN I (HIGH SENSITIVITY)
Troponin I (High Sensitivity): 185 ng/L (ref <18)
Troponin I (High Sensitivity): 180 ng/L (ref <18)

## 2019-04-30 LAB — CBC WITH DIFFERENTIAL/PLATELET
Abs Immature Granulocytes: 0.01 10*3/uL (ref 0.00–0.07)
Basophils Absolute: 0 10*3/uL (ref 0.0–0.1)
Basophils Relative: 0 %
Eosinophils Absolute: 0.2 10*3/uL (ref 0.0–0.5)
Eosinophils Relative: 4 %
HCT: 41.7 % (ref 39.0–52.0)
Hemoglobin: 12.2 g/dL — ABNORMAL LOW (ref 13.0–17.0)
Immature Granulocytes: 0 %
Lymphocytes Relative: 20 %
Lymphs Abs: 0.9 10*3/uL (ref 0.7–4.0)
MCH: 22.1 pg — ABNORMAL LOW (ref 26.0–34.0)
MCHC: 29.3 g/dL — ABNORMAL LOW (ref 30.0–36.0)
MCV: 75.5 fL — ABNORMAL LOW (ref 80.0–100.0)
Monocytes Absolute: 0.6 10*3/uL (ref 0.1–1.0)
Monocytes Relative: 13 %
Neutro Abs: 3 10*3/uL (ref 1.7–7.7)
Neutrophils Relative %: 63 %
Platelets: 250 10*3/uL (ref 150–400)
RBC: 5.52 MIL/uL (ref 4.22–5.81)
RDW: 19.5 % — ABNORMAL HIGH (ref 11.5–15.5)
WBC: 4.7 10*3/uL (ref 4.0–10.5)
nRBC: 0 % (ref 0.0–0.2)

## 2019-04-30 LAB — FIBRINOGEN: Fibrinogen: 412 mg/dL (ref 210–475)

## 2019-04-30 LAB — C-REACTIVE PROTEIN: CRP: 7.8 mg/dL — ABNORMAL HIGH (ref <1.0)

## 2019-04-30 LAB — FERRITIN: Ferritin: 128 ng/mL (ref 24–336)

## 2019-04-30 LAB — TRIGLYCERIDES: Triglycerides: 121 mg/dL (ref <150)

## 2019-04-30 LAB — BRAIN NATRIURETIC PEPTIDE: B Natriuretic Peptide: 94.3 pg/mL (ref 0.0–100.0)

## 2019-04-30 LAB — PROCALCITONIN: Procalcitonin: 0.25 ng/mL

## 2019-04-30 LAB — LACTATE DEHYDROGENASE: LDH: 285 U/L — ABNORMAL HIGH (ref 98–192)

## 2019-04-30 LAB — ABO/RH: ABO/RH(D): A POS

## 2019-04-30 MED ORDER — HEPARIN SODIUM (PORCINE) 5000 UNIT/ML IJ SOLN
5000.0000 [IU] | Freq: Three times a day (TID) | INTRAMUSCULAR | Status: DC
  Administered 2019-04-30 – 2019-05-02 (×5): 5000 [IU] via SUBCUTANEOUS
  Filled 2019-04-30 (×5): qty 1

## 2019-04-30 MED ORDER — SPIRONOLACTONE 25 MG PO TABS
25.0000 mg | ORAL_TABLET | Freq: Two times a day (BID) | ORAL | Status: DC
  Administered 2019-04-30 – 2019-05-02 (×4): 25 mg via ORAL
  Filled 2019-04-30 (×4): qty 1

## 2019-04-30 MED ORDER — ZINC SULFATE 220 (50 ZN) MG PO CAPS
220.0000 mg | ORAL_CAPSULE | Freq: Every day | ORAL | Status: DC
  Administered 2019-04-30 – 2019-05-02 (×3): 220 mg via ORAL
  Filled 2019-04-30 (×3): qty 1

## 2019-04-30 MED ORDER — DEXAMETHASONE SODIUM PHOSPHATE 10 MG/ML IJ SOLN
6.0000 mg | INTRAMUSCULAR | Status: DC
  Administered 2019-04-30 – 2019-05-01 (×2): 6 mg via INTRAVENOUS
  Filled 2019-04-30 (×2): qty 1

## 2019-04-30 MED ORDER — SODIUM CHLORIDE 0.9% FLUSH
3.0000 mL | Freq: Two times a day (BID) | INTRAVENOUS | Status: DC
  Administered 2019-04-30 – 2019-05-02 (×4): 3 mL via INTRAVENOUS

## 2019-04-30 MED ORDER — IPRATROPIUM-ALBUTEROL 20-100 MCG/ACT IN AERS
1.0000 | INHALATION_SPRAY | Freq: Four times a day (QID) | RESPIRATORY_TRACT | Status: DC
  Administered 2019-04-30 – 2019-05-02 (×6): 1 via RESPIRATORY_TRACT
  Filled 2019-04-30: qty 4

## 2019-04-30 MED ORDER — HYDROCOD POLST-CPM POLST ER 10-8 MG/5ML PO SUER
5.0000 mL | Freq: Two times a day (BID) | ORAL | Status: DC | PRN
  Administered 2019-05-01: 5 mL via ORAL
  Filled 2019-04-30: qty 5

## 2019-04-30 MED ORDER — SODIUM CHLORIDE 0.9 % IV SOLN
100.0000 mg | Freq: Every day | INTRAVENOUS | Status: DC
  Administered 2019-05-01 – 2019-05-02 (×2): 100 mg via INTRAVENOUS
  Filled 2019-04-30 (×2): qty 20

## 2019-04-30 MED ORDER — GUAIFENESIN-DM 100-10 MG/5ML PO SYRP
10.0000 mL | ORAL_SOLUTION | ORAL | Status: DC | PRN
  Administered 2019-05-02: 10 mL via ORAL
  Filled 2019-04-30: qty 10

## 2019-04-30 MED ORDER — ONDANSETRON HCL 4 MG PO TABS: 4.0000 mg | ORAL_TABLET | Freq: Four times a day (QID) | ORAL | Status: DC | PRN

## 2019-04-30 MED ORDER — CARVEDILOL 25 MG PO TABS
25.0000 mg | ORAL_TABLET | Freq: Two times a day (BID) | ORAL | Status: DC
  Filled 2019-04-30: qty 1

## 2019-04-30 MED ORDER — ONDANSETRON HCL 4 MG/2ML IJ SOLN: 4.0000 mg | Freq: Four times a day (QID) | INTRAMUSCULAR | Status: DC | PRN

## 2019-04-30 MED ORDER — POTASSIUM CHLORIDE CRYS ER 20 MEQ PO TBCR
20.0000 meq | EXTENDED_RELEASE_TABLET | Freq: Two times a day (BID) | ORAL | Status: DC
  Administered 2019-04-30 – 2019-05-02 (×4): 20 meq via ORAL
  Filled 2019-04-30 (×4): qty 1

## 2019-04-30 MED ORDER — TORSEMIDE 20 MG PO TABS
40.0000 mg | ORAL_TABLET | Freq: Two times a day (BID) | ORAL | Status: DC
  Administered 2019-04-30 – 2019-05-02 (×4): 40 mg via ORAL
  Filled 2019-04-30 (×4): qty 2

## 2019-04-30 MED ORDER — ATORVASTATIN CALCIUM 10 MG PO TABS
20.0000 mg | ORAL_TABLET | Freq: Every day | ORAL | Status: DC
  Administered 2019-04-30 – 2019-05-01 (×2): 20 mg via ORAL
  Filled 2019-04-30 (×2): qty 2

## 2019-04-30 MED ORDER — ASPIRIN 81 MG PO CHEW
81.0000 mg | CHEWABLE_TABLET | Freq: Every day | ORAL | Status: DC
  Administered 2019-04-30 – 2019-05-02 (×3): 81 mg via ORAL
  Filled 2019-04-30 (×3): qty 1

## 2019-04-30 MED ORDER — SODIUM CHLORIDE 0.9 % IV SOLN
200.0000 mg | Freq: Once | INTRAVENOUS | Status: AC
  Administered 2019-04-30: 200 mg via INTRAVENOUS
  Filled 2019-04-30: qty 40

## 2019-04-30 MED ORDER — HYDRALAZINE HCL 50 MG PO TABS
100.0000 mg | ORAL_TABLET | Freq: Three times a day (TID) | ORAL | Status: DC
  Administered 2019-04-30: 100 mg via ORAL
  Filled 2019-04-30: qty 2

## 2019-04-30 MED ORDER — ACETAMINOPHEN 325 MG PO TABS: 650.0000 mg | ORAL_TABLET | Freq: Four times a day (QID) | ORAL | Status: DC | PRN

## 2019-04-30 MED ORDER — ASCORBIC ACID 500 MG PO TABS
500.0000 mg | ORAL_TABLET | Freq: Every day | ORAL | Status: DC
  Administered 2019-04-30 – 2019-05-02 (×3): 500 mg via ORAL
  Filled 2019-04-30 (×3): qty 1

## 2019-04-30 NOTE — Progress Notes (Signed)
Paged patient placement RN about new patient. Placement RN reported that Dr. Posey Pronto was assigned this patient and I will receive orders shortly. RN was aware of patients blood pressure.

## 2019-04-30 NOTE — H&P (Signed)
History and Physical    Frank Coffey:[id number] DOB: [birth year] DOA: 04/30/2019  PCP: Elby Beck, FNP  Patient coming from: [place]  I have personally briefly reviewed patient's old medical records in [place]  Chief Complaint: Shortness of breath  HPI: Frank Coffey is a 39 y.o. male with medical history significant for chronic systolic CHF (EF 09-32%), CKD stage IV, hypertension, hyperlipidemia, obesity, and OSA who presents to the ED for evaluation of shortness of breath.  Patient was initially seen in the ED on 04/26/2019 for symptoms of congestion, fatigue, shortness of breath, cough, and change in smell which began on 04/24/2019.  He had a Covid 19 test which resulted positive.  He was discharged to home and on 04/28/2019 underwent infusion with Bamlanivimab.  He had some chills after the monoclonal antibody infusion but none since.  He has been having persistent cough productive of sputum and this morning had a glob of bloody sputum production.  He has had intermittent shortness of breath, body aches/myalgias, loss of taste and smell, and occasional loose stools.  He has continued chest congestion.  He denies any subjective fevers, chest pain, nausea, vomiting, abdominal pain, dysuria.  Of note, patient states he did receive the first dose of the Moderna COVID-19 vaccination about 2 weeks ago.  [facility] ED Course:  Initial vitals showed BP 129/84, pulse 82, RR 18, temp 98.3 Fahrenheit, SPO2 93% on room air.  Per ED documentation SPO2 dropped to 89% intermittently on room air.  He was placed on 2 L supplemental O2 via [place].  Labs are notable for WBC 4.7, hemoglobin 12.2, platelets 250,000, sodium 137, potassium 4.3, bicarb 26, BUN 48, creatinine 4.92, EGFR 16, serum glucose 98, AST 38, ALT 35, alk phos 70, total bilirubin 0.8, BNP 94.3, high-sensitivity troponin I 185, procalcitonin 0.25, LDH 285, ferritin 128, fibrinogen 412, CRP 7.8.  Portable  chest x-ray was obtained and showed interval patchy bibasilar airspace opacities, right greater than left.  The hospitalist service was consulted to admit for further evaluation management.  Review of Systems: All systems reviewed and are negative except as documented in history of present illness above.   Past Medical History:  Diagnosis Date  . CHF (congestive heart failure) ([place])   . Chronic kidney disease, stage 3, mod decreased GFR   . Gout   . 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 Hx of migraine headaches   . Hyperlipidemia   . Hypertension   . 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 Obesity   . OSA (obstructive sleep apnea)     Past Surgical History:  Procedure Laterality Date  . CHOLECYSTECTOMY    . RETINAL DETACHMENT REPAIR W/ SCLERAL BUCKLE LE     Left    Social History:  reports that he has never smoked. He has never used smokeless tobacco. He reports current alcohol use. He reports that he does not use drugs.  No Known Allergies  Family History  Problem Relation Age of Onset  . Hypertension Mother   . Hyperlipidemia Mother   . Heart disease Mother   . Hypertension Maternal Grandmother   . Stroke Maternal Grandmother   . Heart disease Maternal Grandmother   . Hyperlipidemia Maternal Grandmother   . Stroke Maternal Grandfather   . Hypertension Maternal Grandfather   . Heart disease Maternal Grandfather   . Hyperlipidemia Maternal Grandfather   . Alzheimer's disease Maternal Grandfather   .  Cancer - Other Paternal Grandmother   . Healthy Brother        x2  . Healthy Sister        x2  . Healthy Son        x2  . Healthy Daughter        x1  . Diabetes Neg Hx   . Heart attack Neg Hx   . Sudden death Neg Hx      Prior to Admission medications   Medication Sig Start Date End Date Taking? Authorizing Provider  allopurinol (ZYLOPRIM) 300 MG tablet Take 1 tablet (300 mg total) by mouth daily. 04/07/19  Yes  Elby Beck, FNP  aspirin 81 MG tablet Take 1 tablet (81 mg total) by mouth daily. 11/16/17  Yes Samuella Cota, MD  atorvastatin (LIPITOR) 20 MG tablet Take 1 tablet (20 mg total) by mouth daily. 05/29/18  Yes Elby Beck, FNP  brimonidine (ALPHAGAN) 0.2 % ophthalmic solution Place 1 drop into the left eye 2 (two) times daily.  03/19/19  Yes [provider]  carvedilol (COREG) 25 MG tablet Take 25 mg by mouth 2 (two) times daily with a meal.  11/20/17  Yes [provider]  dorzolamide-timolol (COSOPT) 22.3-6.8 MG/ML ophthalmic solution Place 1 drop into the left eye 2 (two) times daily.  03/10/19  Yes [provider]  DUREZOL 0.05 % EMUL Place 1 drop into the left eye 4 (four) times daily. 03/10/19  Yes [provider]  EQ FIBER SUPPLEMENT PO Take 2 tablets by mouth daily.    Yes [provider]  fluticasone (FLONASE) 50 MCG/ACT nasal spray Place 2 sprays into both nostrils daily. 04/02/19  Yes Copland, Frederico Hamman, MD  hydrALAZINE (APRESOLINE) 100 MG tablet Take 1 tablet (100 mg total) by mouth 3 (three) times daily. 11/16/17  Yes Samuella Cota, MD  latanoprost (XALATAN) 0.005 % ophthalmic solution Place 1 drop into the left eye at bedtime. 04/14/19  Yes [provider]  potassium chloride SA (K-DUR) 20 MEQ tablet Take 1 tablet (20 mEq total) by mouth 2 (two) times daily. 09/03/18  Yes Horton, Barbette Hair, MD  sildenafil (VIAGRA) 25 MG tablet Take 1 tablet (25 mg total) by mouth daily as needed for erectile dysfunction. 10/02/16  Yes Brunetta Jeans, PA-C  spironolactone (ALDACTONE) 25 MG tablet Take 25 mg by mouth 2 (two) times daily.   Yes [provider]  torsemide (DEMADEX) 20 MG tablet TAKE 2 TABLETS(40 MG) BY MOUTH TWICE DAILY Patient taking differently: Take 40 mg by mouth 2 (two) times daily.  09/16/18  Yes Elby Beck, FNP    Physical Exam: Vitals:   04/30/19 1630 04/30/19 1645 04/30/19 1715 04/30/19 1828    BP: (!) 171/108 (!) 178/101 (!) 184/138 (!) 156/104  Pulse: 85 67 76 84  Resp: (!) 24 (!) 23 (!) 26 20  Temp:    98 F (36.7 C)  TempSrc:    Oral  SpO2: 97% (!) 86% 92% 96%  Weight:      Height:       Constitutional: Obese man resting supine in bed, NAD, calm, comfortable Eyes: PERRL, lids and conjunctivae normal ENMT: Mucous membranes are moist. Posterior pharynx clear of any exudate or lesions.Normal dentition.  Neck: normal, supple, no masses. Respiratory: clear to auscultation bilaterally, no wheezing, no crackles. Normal respiratory effort. No accessory muscle use.  Cardiovascular: Regular rate and rhythm, no murmurs / rubs / gallops. No extremity edema. 2+ pedal pulses. Abdomen:  no tenderness, no masses palpated. No hepatosplenomegaly. Bowel sounds positive.  Musculoskeletal: no clubbing / cyanosis. No joint deformity upper and lower extremities. Good ROM, no contractures. Normal muscle tone.  Skin: no rashes, lesions, ulcers. No induration Neurologic: CN 2-12 grossly intact. Sensation intact, Strength 5/5 in all 4.  Psychiatric: Normal judgment and insight. Alert and oriented x 3. Normal mood.    Labs on Admission: I have personally reviewed following labs and imaging studies  CBC: Recent Labs  Lab 04/26/19 1516 04/30/19 1339  WBC 5.8 4.7  NEUTROABS 3.7 3.0  HGB 12.3* 12.2*  HCT 40.8 41.7  MCV 74.5* 75.5*  PLT 228 427   Basic Metabolic Panel: Recent Labs  Lab 04/26/19 1516 04/30/19 1339  NA 139 137  K 4.3 4.3  CL 105 101  CO2 22 26  GLUCOSE 97 98  BUN 59* 48*  CREATININE 5.08* 4.92*  CALCIUM 8.4* 8.5*   GFR: Estimated Creatinine Clearance: 30.5 mL/min (A) (by C-G formula based on SCr of 4.92 mg/dL (H)). Liver Function Tests: Recent Labs  Lab 04/30/19 1339  AST 38  ALT 35  ALKPHOS 70  BILITOT 0.8  PROT 7.1  ALBUMIN 3.1*   No results for input(s): LIPASE, AMYLASE in the last 168 hours. No results for input(s): AMMONIA in the last 168  hours. Coagulation Profile: No results for input(s): INR, PROTIME in the last 168 hours. Cardiac Enzymes: No results for input(s): CKTOTAL, CKMB, CKMBINDEX, TROPONINI in the last 168 hours. BNP (last 3 results) No results for input(s): PROBNP in the last 8760 hours. HbA1C: No results for input(s): HGBA1C in the last 72 hours. CBG: No results for input(s): GLUCAP in the last 168 hours. Lipid Profile: Recent Labs    04/30/19 1339  TRIG 121   Thyroid Function Tests: No results for input(s): TSH, T4TOTAL, FREET4, T3FREE, THYROIDAB in the last 72 hours. Anemia Panel: Recent Labs    04/30/19 1339  FERRITIN 128   Urine analysis:    Component Value Date/Time   COLORURINE YELLOW 02/03/2017 0221   APPEARANCEUR CLEAR 02/03/2017 0221   LABSPEC 1.013 02/03/2017 0221   PHURINE 6.0 02/03/2017 0221   GLUCOSEU NEGATIVE 02/03/2017 0221   HGBUR NEGATIVE 02/03/2017 0221   BILIRUBINUR NEGATIVE 02/03/2017 0221   KETONESUR NEGATIVE 02/03/2017 0221   PROTEINUR 100 (A) 02/03/2017 0221   UROBILINOGEN 0.2 03/10/2013 1535   NITRITE NEGATIVE 02/03/2017 0221   LEUKOCYTESUR NEGATIVE 02/03/2017 0221    Radiological Exams on Admission: DG Chest Portable 1 View  Result Date: 04/30/2019 CLINICAL DATA:  Cough, COVID-19 positive EXAM: PORTABLE CHEST 1 VIEW COMPARISON:  04/26/2019 FINDINGS: Stable cardiomegaly. Low lung volumes. Interval development of patchy bibasilar airspace consolidations, right worse than left. Prominent interstitial markings throughout both lungs. No large pleural fluid collection. No pneumothorax. IMPRESSION: 1. Interval development of patchy bibasilar airspace consolidations, right worse than left. Findings may represent multifocal pneumonia versus alveolar edema. 2. Prominent bilateral interstitial markings suggestive of CHF with edema and/or atypical/viral infection. Electronically Signed   By: Davina Poke D.O.   On: 04/30/2019 14:16    EKG: Independently reviewed. Sinus  rhythm, T wave inversions lateral leads.  QTc prolongation resolved when compared to prior.  Assessment/Plan Principal Problem:   Acute hypoxemic respiratory failure due to COVID-19 [facility]) Active Problems:   Chronic systolic CHF (congestive heart failure) (HCC)   Benign hypertension with chronic kidney disease, stage IV (HCC)   CKD (chronic kidney disease) stage 4, GFR 15-29 ml/min (HCC)   OSA (obstructive sleep  apnea)   Pneumonia due to COVID-19 virus   Hyperlipidemia   Elevated troponin  TANER RZEPKA is a 39 y.o. male with medical history significant for chronic systolic CHF (EF 50-41%), CKD stage IV, hypertension, hyperlipidemia, obesity, and OSA who is admitted with acute hypoxemic respiratory failure due to COVID-19 pneumonia.  Acute hypoxemic respiratory failure due to COVID-19 pneumonia: SARS-CoV-2 positive on 04/26/2019.  Chest x-ray with interval patchy opacities.  Currently requiring 1-2 L supplemental O2 via [place]. -Start IV remdesivir per pharmacy protocol -Start IV Decadron 6 mg daily- -continue supplemental oxygen as needed -Continue Combivent, flutter valve, incentive spirometer -Antitussives, vitamin C, zinc  Elevated troponin: Mildly elevated troponin.  Appears at baseline elevated troponin.  He denies any chest pain, EKG without acute changes.  Likely demand ischemia in setting of viral infection and renal disease.  Chronic systolic CHF: EF 36-43% by echocardiogram 11/14/2017.  Appears euvolemic and compensated at time of admission. -Continue Coreg, spironolactone, torsemide -Monitor strict I/O's and Daily weights  CKD stage IV: Chronic and appears stable relative to recent labs.  Continue to monitor.  Hypertension: BP slightly elevated on admission.  Resume home Coreg, hydralazine, spironolactone, and torsemide.  Hyperlipidemia: Continue atorvastatin.  OSA: Uses CPAP at night which will be held here in setting of COVID-19 infection.  Continue supplemental O2  at night.  DVT prophylaxis: Subcutaneous heparin Code Status: Full code, confirmed with patient Family Communication: Discussed with patient, he has discussed with family Disposition Plan: From home, likely discharge to home in 1-2 days pending symptomatic improvement, ability to wean off oxygen, and ability to transition to outpatient IV remdesivir treatment. Consults called: None Admission status:  Status is: Inpatient  Remains inpatient appropriate because:IV treatments appropriate due to intensity of illness or inability to take PO  Dispo: The patient is from: Home              Anticipated d/c is to: Home              Anticipated d/c date is: 2 days              Patient currently is not medically stable to d/c.  Zada Finders MD [facility]  If 7PM-7AM, please contact night-coverage [email or web address]  04/30/2019, 6:56 PM

## 2019-04-30 NOTE — Telephone Encounter (Signed)
I attempted to contact patient to see how he was feeling, but I was unable to reach him and his VM box was full. I will try again later.

## 2019-04-30 NOTE — ED Triage Notes (Signed)
Pt is +covid-states he coughed up blood this am-NAD-steady gait

## 2019-04-30 NOTE — ED Provider Notes (Addendum)
[facility] Provider Note   CSN: [id number] Arrival date & time: 04/30/19  1215     History Chief Complaint  Patient presents with  . Cough    +COVID    Frank Coffey is a 39 y.o. male.  39 year old male with past medical history including CHF, CKD, hypertension, morbid obesity, hyperlipidemia, OSA who presents with hemoptysis.  Patient was diagnosed with COVID-19 approximately 6 days ago.  He has had cough associated with body aches, fevers, diarrhea, and nasal congestion.  He was evaluated in the ED several days ago and discharged home.  He had a monoclonal antibody infusion at the clinic 2 days ago.  Over the past few days he has had shortness of breath at night when trying to sleep.  This morning he started coughing and coughed up a moderate amount of bright red blood.  He has had multiple episodes of hemoptysis since then.  He denies any chest pain, just has back pain when he coughs.  He denies any history of blood clots and no current anticoagulant use.  The history is provided by the patient.  Cough      Past Medical History:  Diagnosis Date  . CHF (congestive heart failure) ([place])   . Chronic kidney disease, stage 3, mod decreased GFR   . Gout   . 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 Hx of migraine headaches   . Hyperlipidemia   . Hypertension   . 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 Obesity   . OSA (obstructive sleep apnea)     Patient Active Problem List   Diagnosis Date Noted  . Respiratory failure ([place]) 04/30/2019  . Pneumonia due to COVID-19 virus 04/30/2019  . Mobitz type 1 second degree atrioventricular block 11/18/2017  . TIA (transient ischemic attack) 11/14/2017  . Hypertensive urgency 11/14/2017  . Migraine 10/02/2016  . Erectile dysfunction 10/02/2016  . Annual physical exam 02/22/2016  . Onychomycosis 02/22/2016  . Pre-operative cardiovascular examination 07/19/2015  .  NICM (nonischemic cardiomyopathy) ([place]) 07/19/2015  . Morbid obesity due to excess calories ([place]) 07/19/2015  . 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 CHF (congestive heart failure) ([place]) 12/19/2012  . Benign hypertension with chronic kidney disease, stage IV ([place]) 12/19/2012  . Gout 12/19/2012  . CKD (chronic kidney disease) stage 4, GFR 15-29 ml/min ([facility]) 12/19/2012  . OSA (obstructive sleep apnea) 12/19/2012    Past Surgical History:  Procedure Laterality Date  . CHOLECYSTECTOMY    . RETINAL DETACHMENT REPAIR W/ SCLERAL BUCKLE LE     Left       Family History  Problem Relation Age of Onset  . Hypertension Mother   . Hyperlipidemia Mother   . Heart disease Mother   . Hypertension Maternal Grandmother   . Stroke Maternal Grandmother   . Heart disease Maternal Grandmother   . Hyperlipidemia Maternal Grandmother   . Stroke Maternal Grandfather   . Hypertension Maternal Grandfather   . Heart disease Maternal Grandfather   . Hyperlipidemia Maternal Grandfather   . Alzheimer's disease Maternal Grandfather   . Cancer - Other Paternal Grandmother   . Healthy Brother        x2  . Healthy Sister        x2  . Healthy Son        x2  . Healthy Daughter        x1  . Diabetes Neg Hx   . Heart attack  Neg Hx   . Sudden death Neg Hx     Social History   Tobacco Use  . Smoking status: Never Smoker  . Smokeless tobacco: Never Used  Substance Use Topics  . Alcohol use: Yes    Comment: occ  . Drug use: No    Home Medications Prior to Admission medications   Medication Sig Start Date End Date Taking? Authorizing Provider  allopurinol (ZYLOPRIM) 300 MG tablet Take 1 tablet (300 mg total) by mouth daily. 04/07/19   Elby Beck, FNP  aspirin 81 MG tablet Take 1 tablet (81 mg total) by mouth daily. 11/16/17   Samuella Cota, MD  atorvastatin (LIPITOR) 20 MG tablet Take 1 tablet (20 mg total) by mouth daily. 05/29/18   Elby Beck, FNP  brimonidine  (ALPHAGAN) 0.2 % ophthalmic solution 1 drop 2 (two) times daily. 03/19/19   [provider]  carvedilol (COREG) 25 MG tablet Take 25 mg by mouth 2 (two) times daily with a meal.  11/20/17   [provider]  dorzolamide-timolol (COSOPT) 22.3-6.8 MG/ML ophthalmic solution 1 drop 2 (two) times daily. 03/10/19   [provider]  DUREZOL 0.05 % EMUL Place 1 drop into the left eye 4 (four) times daily. 03/10/19   [provider]  EQ FIBER SUPPLEMENT PO Take 2 tablets by mouth daily.     [provider]  fluticasone (FLONASE) 50 MCG/ACT nasal spray Place 2 sprays into both nostrils daily. 04/02/19   Copland, Frederico Hamman, MD  hydrALAZINE (APRESOLINE) 100 MG tablet Take 1 tablet (100 mg total) by mouth 3 (three) times daily. 11/16/17   Samuella Cota, MD  latanoprost (XALATAN) 0.005 % ophthalmic solution Place 1 drop into the left eye at bedtime. 04/14/19   [provider]  potassium chloride SA (K-DUR) 20 MEQ tablet Take 1 tablet (20 mEq total) by mouth 2 (two) times daily. 09/03/18   Horton, Barbette Hair, MD  predniSONE (DELTASONE) 20 MG tablet 2 tabs po daily for 5 days, then 1 tab po daily for 5 days 04/02/19   Copland, Frederico Hamman, MD  sildenafil (VIAGRA) 25 MG tablet Take 1 tablet (25 mg total) by mouth daily as needed for erectile dysfunction. 10/02/16   Brunetta Jeans, PA-C  spironolactone (ALDACTONE) 25 MG tablet Take 25 mg by mouth 2 (two) times daily.    [provider]  torsemide (DEMADEX) 20 MG tablet TAKE 2 TABLETS(40 MG) BY MOUTH TWICE DAILY 09/16/18   Elby Beck, FNP    Allergies    Patient has no known allergies.  Review of Systems   Review of Systems  Respiratory: Positive for cough.    All other systems reviewed and are negative except that which was mentioned in HPI  Physical Exam Updated Vital Signs BP (!) 168/108   Pulse 75   Temp 98.2 F (36.8 C) (Oral)   Resp 14   Ht 5\' 8"  (1.727 m)   Wt (!) 170.1 kg   SpO2 92%    BMI 57.02 kg/m   Physical Exam Vitals and nursing note reviewed.  Constitutional:      General: He is not in acute distress.    Appearance: He is well-developed. He is obese.  HENT:     Head: Normocephalic and atraumatic.     Mouth/Throat:     Mouth: Mucous membranes are moist.     Pharynx: Oropharynx is clear.  Eyes:     Conjunctiva/sclera: Conjunctivae normal.  Cardiovascular:  Rate and Rhythm: Normal rate and regular rhythm.     Heart sounds: Normal heart sounds. No murmur.  Pulmonary:     Comments: Frequent cough, diminished b/l, exam limited by body habitus Abdominal:     General: Bowel sounds are normal. There is no distension.     Palpations: Abdomen is soft.     Tenderness: There is no abdominal tenderness.  Musculoskeletal:     Cervical back: Neck supple.  Skin:    General: Skin is warm and dry.  Neurological:     Mental Status: He is alert and oriented to person, place, and time.     Comments: Fluent speech  Psychiatric:        Judgment: Judgment normal.     ED Results / Procedures / Treatments   Labs (all labs ordered are listed, but only abnormal results are displayed) Labs Reviewed  CBC WITH DIFFERENTIAL/PLATELET - Abnormal; Notable for the following components:      Result Value   Hemoglobin 12.2 (*)    MCV 75.5 (*)    MCH 22.1 (*)    MCHC 29.3 (*)    RDW 19.5 (*)    All other components within normal limits  COMPREHENSIVE METABOLIC PANEL - Abnormal; Notable for the following components:   BUN 48 (*)    Creatinine, Ser 4.92 (*)    Calcium 8.5 (*)    Albumin 3.1 (*)    GFR calc non Af Amer 14 (*)    GFR calc Af Amer 16 (*)    All other components within normal limits  BRAIN NATRIURETIC PEPTIDE  PROCALCITONIN  LACTATE DEHYDROGENASE  FERRITIN  TRIGLYCERIDES  FIBRINOGEN  C-REACTIVE PROTEIN  TROPONIN I (HIGH SENSITIVITY)    EKG EKG Interpretation  Date/Time:  Wednesday April 30 2019 13:47:32 EDT Ventricular Rate:  62 PR Interval:      QRS Duration: 114 QT Interval:  450 QTC Calculation: 457 R Axis:   20 Text Interpretation: Sinus rhythm Probable left ventricular hypertrophy Abnormal T, consider ischemia, diffuse leads overall similar to previous Confirmed by Theotis Burrow [phone]) on 04/30/2019 2:09:34 PM   Radiology DG Chest Portable 1 View  Result Date: 04/30/2019 CLINICAL DATA:  Cough, COVID-19 positive EXAM: PORTABLE CHEST 1 VIEW COMPARISON:  04/26/2019 FINDINGS: Stable cardiomegaly. Low lung volumes. Interval development of patchy bibasilar airspace consolidations, right worse than left. Prominent interstitial markings throughout both lungs. No large pleural fluid collection. No pneumothorax. IMPRESSION: 1. Interval development of patchy bibasilar airspace consolidations, right worse than left. Findings may represent multifocal pneumonia versus alveolar edema. 2. Prominent bilateral interstitial markings suggestive of CHF with edema and/or atypical/viral infection. Electronically Signed   By: Davina Poke D.O.   On: 04/30/2019 14:16    Procedures Procedures (including critical care time)  Medications Ordered in ED Medications - No data to display  ED Course  I have reviewed the triage vital signs and the nursing notes.  Pertinent labs & imaging results that were available during my care of the patient were reviewed by me and considered in my medical decision making (see chart for details).    MDM Rules/Calculators/A&P                      Non-toxic on exam, VS notable for HTN 168/108, O2 sats variable 92-94% on RA however pt intermittently desaturated to 89% while awake when I was in the room; placed on O2 via [place]. Labs show reassuring CBC, Cr 4.92 similar to previous, BNP normal.  CXR shows multifocal pneumonia consistent with known COVID-19 infection.  He may have some CHF changes in addition.  Because of his intermittent hypoxia and difficulty breathing at night, I am concerned about his worsening  respiratory status in the setting of multiple serious comorbidities.  I am also concerned about the possibility of PE; because of his CKD, will have to consider VQ scan rather than CTA of chest.  Discussed with hospitalist, Dr. Cathlean Sauer, who will admit at [facility] for further care.  ADORIAN GWYNNE was evaluated in Emergency Department on 04/30/2019 for the symptoms described in the history of present illness. He was evaluated in the context of the global COVID-19 pandemic, which necessitated consideration that the patient might be at risk for infection with the SARS-CoV-2 virus that causes COVID-19. Institutional protocols and algorithms that pertain to the evaluation of patients at risk for COVID-19 are in a state of rapid change based on information released by regulatory bodies including the CDC and federal and state organizations. These policies and algorithms were followed during the patient's care in the ED.  Final Clinical Impression(s) / ED Diagnoses Final diagnoses:  Pneumonia due to COVID-19 virus  Cough with hemoptysis    Rx / DC Orders ED Discharge Orders    None       Annalycia Done, Wenda Overland, MD 04/30/19 [place], Wenda Overland, MD 04/30/19 1513

## 2019-04-30 NOTE — ED Notes (Signed)
Attempted to call report, RN Jenny Reichmann to call back. Name/# given.

## 2019-05-01 DIAGNOSIS — N184 Chronic kidney disease, stage 4 (severe): Secondary | ICD-10-CM

## 2019-05-01 DIAGNOSIS — I5022 Chronic systolic (congestive) heart failure: Secondary | ICD-10-CM

## 2019-05-01 DIAGNOSIS — R778 Other specified abnormalities of plasma proteins: Secondary | ICD-10-CM

## 2019-05-01 DIAGNOSIS — I129 Hypertensive chronic kidney disease with stage 1 through stage 4 chronic kidney disease, or unspecified chronic kidney disease: Secondary | ICD-10-CM

## 2019-05-01 LAB — COMPREHENSIVE METABOLIC PANEL
ALT: 34 U/L (ref 0–44)
AST: 34 U/L (ref 15–41)
Albumin: 3 g/dL — ABNORMAL LOW (ref 3.5–5.0)
Alkaline Phosphatase: 78 U/L (ref 38–126)
Anion gap: 12 (ref 5–15)
BUN: 46 mg/dL — ABNORMAL HIGH (ref 6–20)
CO2: 24 mmol/L (ref 22–32)
Calcium: 8.9 mg/dL (ref 8.9–10.3)
Chloride: 102 mmol/L (ref 98–111)
Creatinine, Ser: 4.67 mg/dL — ABNORMAL HIGH (ref 0.61–1.24)
GFR calc Af Amer: 17 mL/min — ABNORMAL LOW (ref >60)
GFR calc non Af Amer: 14 mL/min — ABNORMAL LOW (ref >60)
Glucose, Bld: 139 mg/dL — ABNORMAL HIGH (ref 70–99)
Potassium: 5 mmol/L (ref 3.5–5.1)
Sodium: 138 mmol/L (ref 135–145)
Total Bilirubin: 0.8 mg/dL (ref 0.3–1.2)
Total Protein: 7.3 g/dL (ref 6.5–8.1)

## 2019-05-01 LAB — MAGNESIUM: Magnesium: 2.3 mg/dL (ref 1.7–2.4)

## 2019-05-01 LAB — CBC WITH DIFFERENTIAL/PLATELET
Abs Immature Granulocytes: 0 10*3/uL (ref 0.00–0.07)
Basophils Absolute: 0 10*3/uL (ref 0.0–0.1)
Basophils Relative: 0 %
Eosinophils Absolute: 0 10*3/uL (ref 0.0–0.5)
Eosinophils Relative: 0 %
HCT: 41.8 % (ref 39.0–52.0)
Hemoglobin: 12.5 g/dL — ABNORMAL LOW (ref 13.0–17.0)
Immature Granulocytes: 0 %
Lymphocytes Relative: 15 %
Lymphs Abs: 0.5 10*3/uL — ABNORMAL LOW (ref 0.7–4.0)
MCH: 22.2 pg — ABNORMAL LOW (ref 26.0–34.0)
MCHC: 29.9 g/dL — ABNORMAL LOW (ref 30.0–36.0)
MCV: 74.1 fL — ABNORMAL LOW (ref 80.0–100.0)
Monocytes Absolute: 0.1 10*3/uL (ref 0.1–1.0)
Monocytes Relative: 3 %
Neutro Abs: 2.9 10*3/uL (ref 1.7–7.7)
Neutrophils Relative %: 82 %
Platelets: 272 10*3/uL (ref 150–400)
RBC: 5.64 MIL/uL (ref 4.22–5.81)
RDW: 19.6 % — ABNORMAL HIGH (ref 11.5–15.5)
WBC: 3.6 10*3/uL — ABNORMAL LOW (ref 4.0–10.5)
nRBC: 0 % (ref 0.0–0.2)

## 2019-05-01 LAB — C-REACTIVE PROTEIN: CRP: 5.8 mg/dL — ABNORMAL HIGH (ref <1.0)

## 2019-05-01 LAB — PHOSPHORUS: Phosphorus: 2.4 mg/dL — ABNORMAL LOW (ref 2.5–4.6)

## 2019-05-01 LAB — D-DIMER, QUANTITATIVE: D-Dimer, Quant: 0.74 ug/mL-FEU — ABNORMAL HIGH (ref 0.00–0.50)

## 2019-05-01 LAB — FERRITIN: Ferritin: 119 ng/mL (ref 24–336)

## 2019-05-01 LAB — HIV ANTIBODY (ROUTINE TESTING W REFLEX): HIV Screen 4th Generation wRfx: NONREACTIVE

## 2019-05-01 MED ORDER — CARVEDILOL 25 MG PO TABS
25.0000 mg | ORAL_TABLET | Freq: Two times a day (BID) | ORAL | Status: DC
  Administered 2019-05-01 – 2019-05-02 (×3): 25 mg via ORAL
  Filled 2019-05-01 (×3): qty 1

## 2019-05-01 MED ORDER — HYDRALAZINE HCL 50 MG PO TABS
100.0000 mg | ORAL_TABLET | Freq: Three times a day (TID) | ORAL | Status: DC
  Administered 2019-05-01 – 2019-05-02 (×5): 100 mg via ORAL
  Filled 2019-05-01 (×5): qty 2

## 2019-05-01 NOTE — Progress Notes (Signed)
PROGRESS NOTE                                                                                                                                                                                                             Patient Demographics:    Frank Coffey, is a 39 y.o. male, DOB - 08/19/1980, HER:[id number]  Outpatient Primary MD for the patient is Elby Beck, FNP   Admit date - 04/30/2019   LOS - 1  Chief Complaint  Patient presents with  . Cough    +COVID       Brief Narrative: Patient is a 39 y.o. male with PMHx of chronic systolic heart failure, stage IV CKD, OSA on CPAP-who was diagnosed with COVID-19 on 4/8-subsequently got monoclonal antibody infusion on 4/12.  He developed shortness of breath-found to have Covid 19 pneumonia on initial presentation to the emergency room.  Subsequently admitted to the hospitalist service for further evaluation and treatment.  Presented with shortness of breath  Significant Events: 4/8>> diagnosed with COVID-19 4/12>>Bamlanivimab infusion 4/14>> admit to [facility]  COVID-19 medications: Steroids: 4/14>> Remdesivir: 4/14>>  Antibiotics: None  Microbiology data: None  DVT prophylaxis: SQ heparin  Procedures: None  Consults: None    Subjective:    Ann Held today feels better-he was on 2 L of oxygen overnight-he was weaned off oxygen-O2 saturations remained in the low 90's on room air this morning.   Assessment  & Plan :   Acute Hypoxic Resp Failure due to Covid 19 Viral pneumonia: Improved-and titrated to room air this morning-continue steroids and remdesivir.  Follow inflammatory markers-if patient continues to improve clinically-he would be a good candidate for outpatient remdesivir infusion.  Fever: afebrile  O2 requirements:  SpO2: 90 % O2 Flow Rate (L/min): 2 L/min   COVID-19 Labs: Recent Labs    04/30/19 1339 05/01/19 0244  DDIMER  --  0.74*    FERRITIN 128 119  LDH 285*  --   CRP 7.8* 5.8*       Component Value Date/Time   BNP 94.3 04/30/2019 1339   BNP 102.2 (H) 11/16/2014 1656    Recent Labs  Lab 04/30/19 1339  PROCALCITON 0.25    No results found for: SARSCOV2NAA   Prone/Incentive Spirometry: encouraged  incentive spirometry use 3-4/hour.  Minimally elevated troponin: Trend is flat-likely secondary to underlying  kidney disease-not consistent with ACS.  Chronic systolic heart failure (EF 35-40% by TTE on 11/14/2017): euvolemic-continue Coreg, Aldactone and Demadex.  Not a candidate for Entresto given CKD stage IV  HTN: Controlled-continue Coreg, hydralazine  HLD: Continue statin  CKD stage IV: Followed by Dr. Eliberto Ivory kidney-creatinine close to baseline.  He apparently has been told by nephrology that he would likely need dialysis in the near future.  OSA: CPAP nightly (uses/compliant at home)  Obesity: Estimated body mass index is 57.45 kg/m as calculated from the following:   Height as of this encounter: '5\' 8"'$  (1.727 m).   Weight as of this encounter: 171.4 kg.     ABG:    Component Value Date/Time   TCO2 30 11/14/2017 0121    Vent Settings: N/  Condition -Stabl  Family Communication  : Patient will update family himself-have asked him to let me know if family has additional questions.  Code Status :  Full Code  Diet :  Diet Order            Diet renal with fluid restriction Fluid restriction: 1200 mL Fluid; Room service appropriate? Yes; Fluid consistency: Thin  Diet effective now               Disposition Plan  :  Remain hospitalized-Home depending on clinical progress over the next few days  Barriers to discharge: Hypoxia requiring O2 supplementation/complete 5 days of IV Remdesivir  Antimicorbials  :    Anti-infectives (From admission, onward)   Start     Dose/Rate Route Frequency Ordered Stop   05/01/19 1000  remdesivir 100 mg in sodium chloride 0.9 % 100 mL IVPB      100 mg 200 mL/hr over 30 Minutes Intravenous Daily 04/30/19 1854 05/05/19 0959   04/30/19 2100  remdesivir 200 mg in sodium chloride 0.9% 250 mL IVPB     200 mg 580 mL/hr over 30 Minutes Intravenous Once 04/30/19 1854 04/30/19 2109      Inpatient Medications  Scheduled Meds: . vitamin C  500 mg Oral Daily  . aspirin  81 mg Oral Daily  . atorvastatin  20 mg Oral QHS  . carvedilol  25 mg Oral BID WC  . dexamethasone (DECADRON) injection  6 mg Intravenous Q24H  . heparin  5,000 Units Subcutaneous Q8H  . hydrALAZINE  100 mg Oral TID  . Ipratropium-Albuterol  1 puff Inhalation Q6H  . potassium chloride SA  20 mEq Oral BID  . sodium chloride flush  3 mL Intravenous Q12H  . spironolactone  25 mg Oral BID  . torsemide  40 mg Oral BID  . zinc sulfate  220 mg Oral Daily   Continuous Infusions: . remdesivir 100 mg in NS 100 mL 100 mg (05/01/19 0904)   PRN Meds:.acetaminophen, chlorpheniramine-HYDROcodone, guaiFENesin-dextromethorphan, ondansetron **OR** ondansetron (ZOFRAN) IV   Time Spent in minutes  25  See all Orders from today for further details   Oren Binet M.D on 05/01/2019 at 11:15 AM  To page go to [email or web address] - use universal password  [facility] -  Office  [phone]    Objective:   Vitals:   04/30/19 1828 05/01/19 0157 05/01/19 0547 05/01/19 0800  BP: (!) 156/104  (!) 169/120   Pulse: 84  73 64  Resp: '20  19 20  '$ Temp: 98 F (36.7 C)     TempSrc: Oral     SpO2: 96%  95% 90%  Weight:  (!) 171.4 kg    Height:  Wt Readings from Last 3 Encounters:  05/01/19 (!) 171.4 kg  04/26/19 (!) 164.2 kg  04/02/19 (!) 169.9 kg     Intake/Output Summary (Last 24 hours) at 05/01/2019 1115 Last data filed at 05/01/2019 0100 Gross per 24 hour  Intake 600 ml  Output 1100 ml  Net -500 ml     Physical Exam Gen Exam:Alert awake-not in any distress HEENT:atraumatic, normocephalic Chest: B/L clear to auscultation anteriorly CVS:S1S2  regular Abdomen:soft non tender, non distended Extremities:no edema Neurology: Non focal Skin: no rash   Data Review:    CBC Recent Labs  Lab 04/26/19 1516 04/30/19 1339 05/01/19 0244  WBC 5.8 4.7 3.6*  HGB 12.3* 12.2* 12.5*  HCT 40.8 41.7 41.8  PLT 228 250 272  MCV 74.5* 75.5* 74.1*  MCH 22.4* 22.1* 22.2*  MCHC 30.1 29.3* 29.9*  RDW 19.1* 19.5* 19.6*  LYMPHSABS 0.7 0.9 0.5*  MONOABS 1.2* 0.6 0.1  EOSABS 0.2 0.2 0.0  BASOSABS 0.0 0.0 0.0    Chemistries  Recent Labs  Lab 04/26/19 1516 04/30/19 1339 05/01/19 0244  NA 139 137 138  K 4.3 4.3 5.0  CL 105 101 102  CO2 '22 26 24  '$ GLUCOSE 97 98 139*  BUN 59* 48* 46*  CREATININE 5.08* 4.92* 4.67*  CALCIUM 8.4* 8.5* 8.9  MG  --   --  2.3  AST  --  38 34  ALT  --  35 34  ALKPHOS  --  70 78  BILITOT  --  0.8 0.8   ------------------------------------------------------------------------------------------------------------------ Recent Labs    04/30/19 1339  TRIG 121    Lab Results  Component Value Date   HGBA1C 6.0 05/29/2018   ------------------------------------------------------------------------------------------------------------------ No results for input(s): TSH, T4TOTAL, T3FREE, THYROIDAB in the last 72 hours.  Invalid input(s): FREET3 ------------------------------------------------------------------------------------------------------------------ Recent Labs    04/30/19 1339 05/01/19 0244  FERRITIN 128 119    Coagulation profile No results for input(s): INR, PROTIME in the last 168 hours.  Recent Labs    05/01/19 0244  DDIMER 0.74*    Cardiac Enzymes No results for input(s): CKMB, TROPONINI, MYOGLOBIN in the last 168 hours.  Invalid input(s): CK ------------------------------------------------------------------------------------------------------------------    Component Value Date/Time   BNP 94.3 04/30/2019 1339   BNP 102.2 (H) 11/16/2014 1656    Micro Results Recent Results  (from the past 240 hour(s))  SARS Coronavirus 2 Ag (30 min TAT) - Nasal Swab (BD Veritor Kit)     Status: Abnormal   Collection Time: 04/26/19  3:15 PM   Specimen: Nasal Swab (BD Veritor Kit)  Result Value Ref Range Status   SARS Coronavirus 2 Ag POSITIVE (A) NEGATIVE Final    Comment: RESULT CALLED TO, READ BACK BY AND VERIFIED WITH: Josph Macho RN [phone] PHILLIPS C (NOTE) SARS-CoV-2 antigen PRESENT. Positive results indicate the presence of viral antigens, but clinical correlation with patient history and other diagnostic information is necessary to determine patient infection status.  Positive results do not rule out bacterial infection or co-infection  with other viruses. False positive results are rare but can occur, and confirmatory RT-PCR testing may be appropriate in some circumstances. The expected result is Negative. Fact Sheet for Patients: PodPark.tn Fact Sheet for Providers: GiftContent.is  This test is not yet approved or cleared by the [place] FDA and  has been authorized for detection and/or diagnosis of SARS-CoV-2 by FDA under an Emergency Use Authorization (EUA).  This EUA will remain in effect (meaning this test can be used) for the  duration of  the COVID-19 declarati on under Section 564(b)(1) of the Act, 21 U.S.C. section 360bbb-3(b)(1), unless the authorization is terminated or revoked sooner. Performed at [facility], [address] [place], [place] [address]     Radiology Reports DG Chest Portable 1 View  Result Date: 04/30/2019 CLINICAL DATA:  Cough, COVID-19 positive EXAM: PORTABLE CHEST 1 VIEW COMPARISON:  04/26/2019 FINDINGS: Stable cardiomegaly. Low lung volumes. Interval development of patchy bibasilar airspace consolidations, right worse than left. Prominent interstitial markings throughout both lungs. No large pleural fluid collection. No pneumothorax. IMPRESSION: 1.  Interval development of patchy bibasilar airspace consolidations, right worse than left. Findings may represent multifocal pneumonia versus alveolar edema. 2. Prominent bilateral interstitial markings suggestive of CHF with edema and/or atypical/viral infection. Electronically Signed   By: Davina Poke D.O.   On: 04/30/2019 14:16   DG Chest Portable 1 View  Result Date: 04/26/2019 CLINICAL DATA:  Shortness of breath, hypoxia EXAM: PORTABLE CHEST 1 VIEW COMPARISON:  10/09/2018 FINDINGS: Lungs are clear.  No pleural effusion or pneumothorax. Cardiomegaly. IMPRESSION: No evidence of acute cardiopulmonary disease. Electronically Signed   By: Julian Hy M.D.   On: 04/26/2019 15:21

## 2019-05-01 NOTE — Progress Notes (Signed)
Patient's BP continues to be elevated.  MD is aware.  Patient is obese and the BP cuffs we have are not a good fit which may be giving an incorrect reading.  Patient's BP meds have been increased.  Patient denies CP. Will continue to monitor.

## 2019-05-02 LAB — CBC WITH DIFFERENTIAL/PLATELET
Abs Immature Granulocytes: 0.04 10*3/uL (ref 0.00–0.07)
Basophils Absolute: 0 10*3/uL (ref 0.0–0.1)
Basophils Relative: 0 %
Eosinophils Absolute: 0 10*3/uL (ref 0.0–0.5)
Eosinophils Relative: 0 %
HCT: 40.6 % (ref 39.0–52.0)
Hemoglobin: 12.3 g/dL — ABNORMAL LOW (ref 13.0–17.0)
Immature Granulocytes: 0 %
Lymphocytes Relative: 8 %
Lymphs Abs: 0.8 10*3/uL (ref 0.7–4.0)
MCH: 22.1 pg — ABNORMAL LOW (ref 26.0–34.0)
MCHC: 30.3 g/dL (ref 30.0–36.0)
MCV: 72.9 fL — ABNORMAL LOW (ref 80.0–100.0)
Monocytes Absolute: 0.3 10*3/uL (ref 0.1–1.0)
Monocytes Relative: 3 %
Neutro Abs: 8.5 10*3/uL — ABNORMAL HIGH (ref 1.7–7.7)
Neutrophils Relative %: 89 %
Platelets: 308 10*3/uL (ref 150–400)
RBC: 5.57 MIL/uL (ref 4.22–5.81)
RDW: 19.4 % — ABNORMAL HIGH (ref 11.5–15.5)
WBC: 9.6 10*3/uL (ref 4.0–10.5)
nRBC: 0 % (ref 0.0–0.2)

## 2019-05-02 LAB — COMPREHENSIVE METABOLIC PANEL
ALT: 26 U/L (ref 0–44)
AST: 23 U/L (ref 15–41)
Albumin: 3 g/dL — ABNORMAL LOW (ref 3.5–5.0)
Alkaline Phosphatase: 76 U/L (ref 38–126)
Anion gap: 11 (ref 5–15)
BUN: 52 mg/dL — ABNORMAL HIGH (ref 6–20)
CO2: 23 mmol/L (ref 22–32)
Calcium: 9 mg/dL (ref 8.9–10.3)
Chloride: 103 mmol/L (ref 98–111)
Creatinine, Ser: 4.63 mg/dL — ABNORMAL HIGH (ref 0.61–1.24)
GFR calc Af Amer: 17 mL/min — ABNORMAL LOW (ref >60)
GFR calc non Af Amer: 15 mL/min — ABNORMAL LOW (ref >60)
Glucose, Bld: 144 mg/dL — ABNORMAL HIGH (ref 70–99)
Potassium: 4.9 mmol/L (ref 3.5–5.1)
Sodium: 137 mmol/L (ref 135–145)
Total Bilirubin: 0.5 mg/dL (ref 0.3–1.2)
Total Protein: 7.1 g/dL (ref 6.5–8.1)

## 2019-05-02 LAB — FERRITIN: Ferritin: 94 ng/mL (ref 24–336)

## 2019-05-02 LAB — C-REACTIVE PROTEIN: CRP: 2.7 mg/dL — ABNORMAL HIGH (ref <1.0)

## 2019-05-02 LAB — D-DIMER, QUANTITATIVE: D-Dimer, Quant: 0.66 ug/mL-FEU — ABNORMAL HIGH (ref 0.00–0.50)

## 2019-05-02 MED ORDER — DEXAMETHASONE 4 MG PO TABS: 4.0000 mg | ORAL_TABLET | Freq: Every day | ORAL | 0 refills | Status: AC

## 2019-05-02 MED ORDER — NIFEDIPINE ER 60 MG PO TB24: 60.0000 mg | ORAL_TABLET | Freq: Every day | ORAL | 0 refills | Status: DC

## 2019-05-02 MED ORDER — BENZONATATE 100 MG PO CAPS: 100.0000 mg | ORAL_CAPSULE | Freq: Four times a day (QID) | ORAL | 0 refills | Status: DC | PRN

## 2019-05-02 MED ORDER — AMLODIPINE BESYLATE 10 MG PO TABS
10.0000 mg | ORAL_TABLET | Freq: Every day | ORAL | Status: DC
  Administered 2019-05-02: 10 mg via ORAL
  Filled 2019-05-02: qty 1

## 2019-05-02 MED FILL — BENZONATATE 100 MG CAP: 100 | 8 days supply | Qty: 30 | Fill #0

## 2019-05-02 MED FILL — NIFEdipine ER 60 MG TB24: 60 | 30 days supply | Qty: 30 | Fill #0

## 2019-05-02 MED FILL — DEXAMETHASONE 4 MG TABLET: 4 | 5 days supply | Qty: 5 | Fill #0

## 2019-05-02 NOTE — Care Management (Signed)
Pt deemed stable for discharge home today.  Pt confirms he is independent from home.  Pt confirms he has a PCP and denied barriers with paying for discharge meds.  Pt in agreement with COVID at home referral - CM submitted via email.  No outstanding CM needs determined - CM signing off

## 2019-05-02 NOTE — Telephone Encounter (Signed)
Patient states he was just discharged from the hospital today, and is scheduled to get infusions for the next 2 days.  Patient states he is feeling ok - he has a semi-productive cough and loss of taste, and fatigue. Patient denies any fever, shortness of breath or chest pain.

## 2019-05-02 NOTE — Discharge Instructions (Addendum)
You are scheduled for an outpatient infusion of Remdesivir at 10:00 AM on Saturday 4/17 and Sunday 4/18. Please report to [facility] at [address]  Drive to the security guard and tell them you are here for an infusion. They will direct you to the front entrance where we will come and get you.  For questions call [phone].  Thanks       Person Under Monitoring Name: Frank Coffey  Location: [id number] [place] [address]   Infection Prevention Recommendations for Individuals Confirmed to have, or Being Evaluated for, 2019 Novel Coronavirus (COVID-19) Infection Who Receive Care at Home  Individuals who are confirmed to have, or are being evaluated for, COVID-19 should follow the prevention steps below until a healthcare provider or local or state health department says they can return to normal activities.  Stay home except to get medical care You should restrict activities outside your home, except for getting medical care. Do not go to work, school, or public areas, and do not use public transportation or taxis.  Call ahead before visiting your doctor Before your medical appointment, call the healthcare provider and tell them that you have, or are being evaluated for, COVID-19 infection. This will help the healthcare provider's office take steps to keep other people from getting infected. Ask your healthcare provider to call the local or state health department.  Monitor your symptoms Seek prompt medical attention if your illness is worsening (e.g., difficulty breathing). Before going to your medical appointment, call the healthcare provider and tell them that you have, or are being evaluated for, COVID-19 infection. Ask your healthcare provider to call the local or state health department.  Wear a facemask You should wear a facemask that covers your nose and mouth when you are in the same room with other people and when you visit a healthcare  provider. People who live with or visit you should also wear a facemask while they are in the same room with you.  Separate yourself from other people in your home As much as possible, you should stay in a different room from other people in your home. Also, you should use a separate bathroom, if available.  Avoid sharing household items You should not share dishes, drinking glasses, cups, eating utensils, towels, bedding, or other items with other people in your home. After using these items, you should wash them thoroughly with soap and water.  Cover your coughs and sneezes Cover your mouth and nose with a tissue when you cough or sneeze, or you can cough or sneeze into your sleeve. Throw used tissues in a lined trash can, and immediately wash your hands with soap and water for at least 20 seconds or use an alcohol-based hand rub.  Wash your [facility] your hands often and thoroughly with soap and water for at least 20 seconds. You can use an alcohol-based hand sanitizer if soap and water are not available and if your hands are not visibly dirty. Avoid touching your eyes, nose, and mouth with unwashed hands.   Prevention Steps for Caregivers and Household Members of Individuals Confirmed to have, or Being Evaluated for, COVID-19 Infection Being Cared for in the Home  If you live with, or provide care at home for, a person confirmed to have, or being evaluated for, COVID-19 infection please follow these guidelines to prevent infection:  Follow healthcare provider's instructions Make sure that you understand and can help the patient follow any healthcare provider  instructions for all care.  Provide for the patient's basic needs You should help the patient with basic needs in the home and provide support for getting groceries, prescriptions, and other personal needs.  Monitor the patient's symptoms If they are getting sicker, call his or her medical provider and tell them that the  patient has, or is being evaluated for, COVID-19 infection. This will help the healthcare provider's office take steps to keep other people from getting infected. Ask the healthcare provider to call the local or state health department.  Limit the number of people who have contact with the patient  If possible, have only one caregiver for the patient.  Other household members should stay in another home or place of residence. If this is not possible, they should stay  in another room, or be separated from the patient as much as possible. Use a separate bathroom, if available.  Restrict visitors who do not have an essential need to be in the home.  Keep older adults, very young children, and other sick people away from the patient Keep older adults, very young children, and those who have compromised immune systems or chronic health conditions away from the patient. This includes people with chronic heart, lung, or kidney conditions, diabetes, and cancer.  Ensure good ventilation Make sure that shared spaces in the home have good air flow, such as from an air conditioner or an opened window, weather permitting.  Wash your hands often  Wash your hands often and thoroughly with soap and water for at least 20 seconds. You can use an alcohol based hand sanitizer if soap and water are not available and if your hands are not visibly dirty.  Avoid touching your eyes, nose, and mouth with unwashed hands.  Use disposable paper towels to dry your hands. If not available, use dedicated cloth towels and replace them when they become wet.  Wear a facemask and gloves  Wear a disposable facemask at all times in the room and gloves when you touch or have contact with the patient's blood, body fluids, and/or secretions or excretions, such as sweat, saliva, sputum, nasal mucus, vomit, urine, or feces.  Ensure the mask fits over your nose and mouth tightly, and do not touch it during use.  Throw out  disposable facemasks and gloves after using them. Do not reuse.  Wash your hands immediately after removing your facemask and gloves.  If your personal clothing becomes contaminated, carefully remove clothing and launder. Wash your hands after handling contaminated clothing.  Place all used disposable facemasks, gloves, and other waste in a lined container before disposing them with other household waste.  Remove gloves and wash your hands immediately after handling these items.  Do not share dishes, glasses, or other household items with the patient  Avoid sharing household items. You should not share dishes, drinking glasses, cups, eating utensils, towels, bedding, or other items with a patient who is confirmed to have, or being evaluated for, COVID-19 infection.  After the person uses these items, you should wash them thoroughly with soap and water.  Wash laundry thoroughly  Immediately remove and wash clothes or bedding that have blood, body fluids, and/or secretions or excretions, such as sweat, saliva, sputum, nasal mucus, vomit, urine, or feces, on them.  Wear gloves when handling laundry from the patient.  Read and follow directions on labels of laundry or clothing items and detergent. In general, wash and dry with the warmest temperatures recommended on the label.  Clean all areas the individual has used often  Clean all touchable surfaces, such as counters, tabletops, doorknobs, bathroom fixtures, toilets, phones, keyboards, tablets, and bedside tables, every day. Also, clean any surfaces that may have blood, body fluids, and/or secretions or excretions on them.  Wear gloves when cleaning surfaces the patient has come in contact with.  Use a diluted bleach solution (e.g., dilute bleach with 1 part bleach and 10 parts water) or a household disinfectant with a label that says EPA-registered for coronaviruses. To make a bleach solution at home, add 1 tablespoon of bleach to 1  quart (4 cups) of water. For a larger supply, add  cup of bleach to 1 gallon (16 cups) of water.  Read labels of cleaning products and follow recommendations provided on product labels. Labels contain instructions for safe and effective use of the cleaning product including precautions you should take when applying the product, such as wearing gloves or eye protection and making sure you have good ventilation during use of the product.  Remove gloves and wash hands immediately after cleaning.  Monitor yourself for signs and symptoms of illness Caregivers and household members are considered close contacts, should monitor their health, and will be asked to limit movement outside of the home to the extent possible. Follow the monitoring steps for close contacts listed on the symptom monitoring form.   ? If you have additional questions, contact your local health department or call the epidemiologist on call at [phone] (available 24/7). ? This guidance is subject to change. For the most up-to-date guidance from [facility], please refer to their website: [email or web address]

## 2019-05-02 NOTE — Progress Notes (Signed)
Patient scheduled for outpatient Remdesivir infusion at 10:00 AM on Saturday 4/17 and Sunday 4/18.  Please advise them to report to [facility] at [address].  Drive to the security guard and tell them you are here for an infusion. They will direct you to the front entrance where we will come and get you.  For questions call [phone].  Thanks

## 2019-05-02 NOTE — Discharge Summary (Signed)
PATIENT DETAILS Name: Frank Coffey Age: 39 y.o. Sex: male Date of Birth: 1980-03-12 MRN: [id number]. Admitting Physician: Tawni Millers, MD [id number], Dalbert Batman, FNP  Admit Date: 04/30/2019 Discharge date: 05/02/2019  Recommendations for Outpatient Follow-up:  1. Follow up with PCP in 1-2 weeks 2. Please obtain CMP/CBC in one week 3. Repeat Chest Xray in 4-6 week 4. Please follow up with nephrology  Admitted From:  Home  Disposition: [place]: No  Equipment/Devices: None  Discharge Condition: Stable  CODE STATUS: FULL CODE  Diet recommendation:  Diet Order            Diet - low sodium heart healthy        Diet renal with fluid restriction Fluid restriction: 1200 mL Fluid; Room service appropriate? Yes; Fluid consistency: Thin  Diet effective now               Brief Narrative: Patient is a 39 y.o. male with PMHx of chronic systolic heart failure, stage IV CKD, OSA on CPAP-who was diagnosed with COVID-19 on 4/8-subsequently got monoclonal antibody infusion on 4/12.  He developed shortness of breath-found to have Covid 19 pneumonia on initial presentation to the emergency room.  Subsequently admitted to the hospitalist service for further evaluation and treatment.  Presented with shortness of breath  Significant Events: 4/8>> diagnosed with COVID-19 4/12>>Bamlanivimab infusion 4/14>> admit to [facility]  COVID-19 medications: Steroids: 4/14>> Remdesivir: 4/14>>  Antibiotics: None  Microbiology data: None  Procedures: None  Consults: None  Brief Hospital Course: Acute Hypoxic Resp Failure due to Covid 19 Viral pneumonia:  Significantly better-required O2 at night (did not wear CPAP) but is otherwise not hypoxic this morning.  CRP downtrending.  Will continue steroids for a few more days-he will complete remdesivir as an outpatient-2 more doses at the infusion center at [facility]  Patient is agreeable with the  plan-he feels significantly better than how he first came into the hospital.  He is aware that if he gets short of breath-he is to present back to the emergency room right away.    COVID-19 Labs:  Recent Labs    04/30/19 1339 05/01/19 0244 05/02/19 0224  DDIMER  --  0.74* 0.66*  FERRITIN 128 119 94  LDH 285*  --   --   CRP 7.8* 5.8* 2.7*    No results found for: SARSCOV2NAA   Minimally elevated troponin: Trend is flat-likely secondary to underlying kidney disease-not consistent with ACS.  Chronic systolic heart failure (EF 35-40% by TTE on 11/14/2017): euvolemic-continue Coreg, Aldactone and Demadex.  Not a candidate for Entresto given CKD stage IV  HTN: Uncontrolled-not sure if the readings are accurate as we do not have a right BP cuff size for this patient (obese).  He is being continued on Coreg, hydralazine, Demadex and Aldactone-discussed with Dr. Patel-patient's primary nephrologist-recommends we add nifedipine.  He will follow with patient and continue to optimize in the outpatient setting.  HLD: Continue statin  CKD stage IV: Followed by Dr. Eliberto Ivory kidney-creatinine close to baseline.  He apparently has been told by nephrology that he would likely need dialysis in the near future.  OSA: CPAP nightly (uses/compliant at home)  Obesity: Estimated body mass index is 57.05 kg/m as calculated from the following:   Height as of this encounter: _0  (1.727 m).   Weight as of this encounter: 170.2 kg.    Discharge Diagnoses:  Principal Problem:   Acute hypoxemic respiratory failure due to  COVID-19 [facility]) Active Problems:   Chronic systolic CHF (congestive heart failure) (HCC)   Benign hypertension with chronic kidney disease, stage IV (HCC)   CKD (chronic kidney disease) stage 4, GFR 15-29 ml/min (HCC)   OSA (obstructive sleep apnea)   Pneumonia due to COVID-19 virus   Hyperlipidemia   Elevated troponin   Discharge Instructions:    Person Under  Monitoring Name: [name]  Location: [place] [address]   Infection Prevention Recommendations for Individuals Confirmed to have, or Being Evaluated for, 2019 Novel Coronavirus (COVID-19) Infection Who Receive Care at Home  Individuals who are confirmed to have, or are being evaluated for, COVID-19 should follow the prevention steps below until a healthcare provider or local or state health department says they can return to normal activities.  Stay home except to get medical care You should restrict activities outside your home, except for getting medical care. Do not go to work, school, or public areas, and do not use public transportation or taxis.  Call ahead before visiting your doctor Before your medical appointment, call the healthcare provider and tell them that you have, or are being evaluated for, COVID-19 infection. This will help the healthcare provider's office take steps to keep other people from getting infected. Ask your healthcare provider to call the local or state health department.  Monitor your symptoms Seek prompt medical attention if your illness is worsening (e.g., difficulty breathing). Before going to your medical appointment, call the healthcare provider and tell them that you have, or are being evaluated for, COVID-19 infection. Ask your healthcare provider to call the local or state health department.  Wear a facemask You should wear a facemask that covers your nose and mouth when you are in the same room with other people and when you visit a healthcare provider. People who live with or visit you should also wear a facemask while they are in the same room with you.  Separate yourself from other people in your home As much as possible, you should stay in a different room from other people in your home. Also, you should use a separate bathroom, if available.  Avoid sharing household items You should not share dishes, drinking  glasses, cups, eating utensils, towels, bedding, or other items with other people in your home. After using these items, you should wash them thoroughly with soap and water.  Cover your coughs and sneezes Cover your mouth and nose with a tissue when you cough or sneeze, or you can cough or sneeze into your sleeve. Throw used tissues in a lined trash can, and immediately wash your hands with soap and water for at least 20 seconds or use an alcohol-based hand rub.  Wash your [facility] your hands often and thoroughly with soap and water for at least 20 seconds. You can use an alcohol-based hand sanitizer if soap and water are not available and if your hands are not visibly dirty. Avoid touching your eyes, nose, and mouth with unwashed hands.   Prevention Steps for Caregivers and Household Members of Individuals Confirmed to have, or Being Evaluated for, COVID-19 Infection Being Cared for in the Home  If you live with, or provide care at home for, a person confirmed to have, or being evaluated for, COVID-19 infection please follow these guidelines to prevent infection:  Follow healthcare provider's instructions Make sure that you understand and can help the patient follow any healthcare provider instructions for all care.  Provide for the  patient's basic needs You should help the patient with basic needs in the home and provide support for getting groceries, prescriptions, and other personal needs.  Monitor the patient's symptoms If they are getting sicker, call his or her medical provider and tell them that the patient has, or is being evaluated for, COVID-19 infection. This will help the healthcare provider's office take steps to keep other people from getting infected. Ask the healthcare provider to call the local or state health department.  Limit the number of people who have contact with the patient  If possible, have only one caregiver for the patient.  Other household members  should stay in another home or place of residence. If this is not possible, they should stay  in another room, or be separated from the patient as much as possible. Use a separate bathroom, if available.  Restrict visitors who do not have an essential need to be in the home.  Keep older adults, very young children, and other sick people away from the patient Keep older adults, very young children, and those who have compromised immune systems or chronic health conditions away from the patient. This includes people with chronic heart, lung, or kidney conditions, diabetes, and cancer.  Ensure good ventilation Make sure that shared spaces in the home have good air flow, such as from an air conditioner or an opened window, weather permitting.  Wash your hands often  Wash your hands often and thoroughly with soap and water for at least 20 seconds. You can use an alcohol based hand sanitizer if soap and water are not available and if your hands are not visibly dirty.  Avoid touching your eyes, nose, and mouth with unwashed hands.  Use disposable paper towels to dry your hands. If not available, use dedicated cloth towels and replace them when they become wet.  Wear a facemask and gloves  Wear a disposable facemask at all times in the room and gloves when you touch or have contact with the patient's blood, body fluids, and/or secretions or excretions, such as sweat, saliva, sputum, nasal mucus, vomit, urine, or feces.  Ensure the mask fits over your nose and mouth tightly, and do not touch it during use.  Throw out disposable facemasks and gloves after using them. Do not reuse.  Wash your hands immediately after removing your facemask and gloves.  If your personal clothing becomes contaminated, carefully remove clothing and launder. Wash your hands after handling contaminated clothing.  Place all used disposable facemasks, gloves, and other waste in a lined container before disposing them  with other household waste.  Remove gloves and wash your hands immediately after handling these items.  Do not share dishes, glasses, or other household items with the patient  Avoid sharing household items. You should not share dishes, drinking glasses, cups, eating utensils, towels, bedding, or other items with a patient who is confirmed to have, or being evaluated for, COVID-19 infection.  After the person uses these items, you should wash them thoroughly with soap and water.  Wash laundry thoroughly  Immediately remove and wash clothes or bedding that have blood, body fluids, and/or secretions or excretions, such as sweat, saliva, sputum, nasal mucus, vomit, urine, or feces, on them.  Wear gloves when handling laundry from the patient.  Read and follow directions on labels of laundry or clothing items and detergent. In general, wash and dry with the warmest temperatures recommended on the label.  Clean all areas the individual has used often  Clean all touchable surfaces, such as counters, tabletops, doorknobs, bathroom fixtures, toilets, phones, keyboards, tablets, and bedside tables, every day. Also, clean any surfaces that may have blood, body fluids, and/or secretions or excretions on them.  Wear gloves when cleaning surfaces the patient has come in contact with.  Use a diluted bleach solution (e.g., dilute bleach with 1 part bleach and 10 parts water) or a household disinfectant with a label that says EPA-registered for coronaviruses. To make a bleach solution at home, add 1 tablespoon of bleach to 1 quart (4 cups) of water. For a larger supply, add  cup of bleach to 1 gallon (16 cups) of water.  Read labels of cleaning products and follow recommendations provided on product labels. Labels contain instructions for safe and effective use of the cleaning product including precautions you should take when applying the product, such as wearing gloves or eye protection and making sure  you have good ventilation during use of the product.  Remove gloves and wash hands immediately after cleaning.  Monitor yourself for signs and symptoms of illness Caregivers and household members are considered close contacts, should monitor their health, and will be asked to limit movement outside of the home to the extent possible. Follow the monitoring steps for close contacts listed on the symptom monitoring form.   ? If you have additional questions, contact your local health department or call the epidemiologist on call at [phone] (available 24/7). ? This guidance is subject to change. For the most up-to-date guidance from CDC, please refer to their website: YouBlogs.pl    Activity:  As tolerated with Full fall precautions use walker/cane & assistance as needed   Discharge Instructions    (HEART FAILURE PATIENTS) Call MD:  Anytime you have any of the following symptoms: 1) 3 pound weight gain in 24 hours or 5 pounds in 1 week 2) shortness of breath, with or without a dry hacking cough 3) swelling in the hands, feet or stomach 4) if you have to sleep on extra pillows at night in order to breathe.   Complete by: As directed    Call MD for:  difficulty breathing, headache or visual disturbances   Complete by: As directed    Call MD for:  extreme fatigue   Complete by: As directed    Call MD for:  persistant nausea and vomiting   Complete by: As directed    Diet - low sodium heart healthy   Complete by: As directed    Fluid restriction of 1.5 L a day   Discharge instructions   Complete by: As directed    1.)you are scheduled for outpatient Remdesivir infusion at 10:00 AM on Saturday 4/17 and Sunday 4/18.  Please advise them to report to [facility] at [address]  Drive to the security guard and tell them you are here for an infusion. They will direct you to the front entrance where we will come  and get you.  For questions call [phone].  Thanks    2.)  3 weeks of isolation from 04/24/2019   Increase activity slowly   Complete by: As directed      Allergies as of 05/02/2019   No Known Allergies     Medication List    TAKE these medications   Aldactone 25 MG tablet Generic drug: spironolactone Take 25 mg by mouth 2 (two) times daily.   allopurinol 300 MG tablet Commonly known as: ZYLOPRIM Take 1 tablet (300 mg total)  by mouth daily.   aspirin 81 MG tablet Take 1 tablet (81 mg total) by mouth daily.   atorvastatin 20 MG tablet Commonly known as: LIPITOR Take 1 tablet (20 mg total) by mouth daily.   benzonatate 100 MG capsule Commonly known as: Tessalon Perles Take 1 capsule (100 mg total) by mouth every 6 (six) hours as needed for cough.   brimonidine 0.2 % ophthalmic solution Commonly known as: ALPHAGAN Place 1 drop into the left eye 2 (two) times daily.   carvedilol 25 MG tablet Commonly known as: COREG Take 25 mg by mouth 2 (two) times daily with a meal.   dexamethasone 4 MG tablet Commonly known as: Decadron Take 1 tablet (4 mg total) by mouth daily for 5 days.   dorzolamide-timolol 22.3-6.8 MG/ML ophthalmic solution Commonly known as: COSOPT Place 1 drop into the left eye 2 (two) times daily.   Durezol 0.05 % Emul Generic drug: Difluprednate Place 1 drop into the left eye 4 (four) times daily.   EQ FIBER SUPPLEMENT PO Take 2 tablets by mouth daily.   fluticasone 50 MCG/ACT nasal spray Commonly known as: FLONASE Place 2 sprays into both nostrils daily.   hydrALAZINE 100 MG tablet Commonly known as: APRESOLINE Take 1 tablet (100 mg total) by mouth 3 (three) times daily.   latanoprost 0.005 % ophthalmic solution Commonly known as: XALATAN Place 1 drop into the left eye at bedtime.   NIFEdipine 60 MG 24 hr tablet Commonly known as: ADALAT CC Take 1 tablet (60 mg total) by mouth at bedtime.   potassium chloride SA 20 MEQ tablet Commonly  known as: KLOR-CON Take 1 tablet (20 mEq total) by mouth 2 (two) times daily.   sildenafil 25 MG tablet Commonly known as: VIAGRA Take 1 tablet (25 mg total) by mouth daily as needed for erectile dysfunction.   torsemide 20 MG tablet Commonly known as: DEMADEX TAKE 2 TABLETS(40 MG) BY MOUTH TWICE DAILY What changed: See the new instructions.      Follow-up Information    Elby Beck, FNP. Schedule an appointment as soon as possible for a visit in 1 week(s).   Specialties: Nurse Practitioner, Family Medicine Contact information: [place] [address] [phone]        Skeet Latch, MD. Schedule an appointment as soon as possible for a visit in 1 month(s).   Specialty: Cardiology Contact information: [address] [place] [address] [phone]        Elmarie Shiley, MD. Schedule an appointment as soon as possible for a visit in 2 day(s).   Specialty: Nephrology Contact information: [place] [address] [phone]          No Known Allergies    Other Procedures/Studies: DG Chest Portable 1 View  Result Date: 04/30/2019 CLINICAL DATA:  Cough, COVID-19 positive EXAM: PORTABLE CHEST 1 VIEW COMPARISON:  04/26/2019 FINDINGS: Stable cardiomegaly. Low lung volumes. Interval development of patchy bibasilar airspace consolidations, right worse than left. Prominent interstitial markings throughout both lungs. No large pleural fluid collection. No pneumothorax. IMPRESSION: 1. Interval development of patchy bibasilar airspace consolidations, right worse than left. Findings may represent multifocal pneumonia versus alveolar edema. 2. Prominent bilateral interstitial markings suggestive of CHF with edema and/or atypical/viral infection. Electronically Signed   By: Davina Poke D.O.   On: 04/30/2019 14:16   DG Chest Portable 1 View  Result Date: 04/26/2019 CLINICAL DATA:  Shortness of breath, hypoxia EXAM: PORTABLE  CHEST 1  VIEW COMPARISON:  10/09/2018 FINDINGS: Lungs are clear.  No pleural effusion or pneumothorax. Cardiomegaly. IMPRESSION: No evidence of acute cardiopulmonary disease. Electronically Signed   By: Julian Hy M.D.   On: 04/26/2019 15:21     TODAY-DAY OF DISCHARGE:  Subjective:   Ann Held today has no headache,no chest abdominal pain,no new weakness tingling or numbness, feels much better wants to go home today.   Objective:   Blood pressure (!) 171/115, pulse 81, temperature 97.9 F (36.6 C), temperature source Oral, resp. rate 20, height _0  (1.727 m), weight (!) 170.2 kg, SpO2 94 %.  Intake/Output Summary (Last 24 hours) at 05/02/2019 1046 Last data filed at 05/02/2019 0851 Gross per 24 hour  Intake 1735.91 ml  Output 1850 ml  Net -114.09 ml   Filed Weights   04/30/19 1244 05/01/19 0157 05/02/19 0500  Weight: (!) 170.1 kg (!) 171.4 kg (!) 170.2 kg    Exam: Awake Alert, Oriented *3, No new F.N deficits, Normal affect [place].AT,PERRAL Supple Neck,No JVD, No cervical lymphadenopathy appriciated.  Symmetrical Chest wall movement, Good air movement bilaterally, CTAB RRR,No Gallops,Rubs or new Murmurs, No Parasternal Heave +ve B.Sounds, Abd Soft, Non tender, No organomegaly appriciated, No rebound -guarding or rigidity. No Cyanosis, Clubbing or edema, No new Rash or bruise   PERTINENT RADIOLOGIC STUDIES: DG Chest Portable 1 View  Result Date: 04/30/2019 CLINICAL DATA:  Cough, COVID-19 positive EXAM: PORTABLE CHEST 1 VIEW COMPARISON:  04/26/2019 FINDINGS: Stable cardiomegaly. Low lung volumes. Interval development of patchy bibasilar airspace consolidations, right worse than left. Prominent interstitial markings throughout both lungs. No large pleural fluid collection. No pneumothorax. IMPRESSION: 1. Interval development of patchy bibasilar airspace consolidations, right worse than left. Findings may represent multifocal pneumonia versus alveolar edema. 2. Prominent  bilateral interstitial markings suggestive of CHF with edema and/or atypical/viral infection. Electronically Signed   By: Davina Poke D.O.   On: 04/30/2019 14:16   DG Chest Portable 1 View  Result Date: 04/26/2019 CLINICAL DATA:  Shortness of breath, hypoxia EXAM: PORTABLE CHEST 1 VIEW COMPARISON:  10/09/2018 FINDINGS: Lungs are clear.  No pleural effusion or pneumothorax. Cardiomegaly. IMPRESSION: No evidence of acute cardiopulmonary disease. Electronically Signed   By: Julian Hy M.D.   On: 04/26/2019 15:21     PERTINENT LAB RESULTS: CBC: Recent Labs    05/01/19 0244 05/02/19 0224  WBC 3.6* 9.6  HGB 12.5* 12.3*  HCT 41.8 40.6  PLT 272 308   CMET CMP     Component Value Date/Time   NA 137 05/02/2019 0224   NA 142 11/21/2017 0841   K 4.9 05/02/2019 0224   CL 103 05/02/2019 0224   CO2 23 05/02/2019 0224   GLUCOSE 144 (H) 05/02/2019 0224   BUN 52 (H) 05/02/2019 0224   BUN 32 (H) 11/21/2017 0841   CREATININE 4.63 (H) 05/02/2019 0224   CREATININE 3.00 (H) 12/01/2014 1003   CALCIUM 9.0 05/02/2019 0224   PROT 7.1 05/02/2019 0224   ALBUMIN 3.0 (L) 05/02/2019 0224   AST 23 05/02/2019 0224   ALT 26 05/02/2019 0224   ALKPHOS 76 05/02/2019 0224   BILITOT 0.5 05/02/2019 0224   GFRNONAA 15 (L) 05/02/2019 0224   GFRNONAA 24 (L) 11/16/2014 1656   GFRAA 17 (L) 05/02/2019 0224   GFRAA 28 (L) 11/16/2014 1656    GFR Estimated Creatinine Clearance: 32.4 mL/min (A) (by C-G formula based on SCr of 4.63 mg/dL (H)). No results for input(s): LIPASE, AMYLASE in the last 72 hours. No results for  input(s): CKTOTAL, CKMB, CKMBINDEX, TROPONINI in the last 72 hours. Invalid input(s): POCBNP Recent Labs    05/01/19 0244 05/02/19 0224  DDIMER 0.74* 0.66*   No results for input(s): HGBA1C in the last 72 hours. Recent Labs    04/30/19 1339  TRIG 121   No results for input(s): TSH, T4TOTAL, T3FREE, THYROIDAB in the last 72 hours.  Invalid input(s): FREET3 Recent Labs     05/01/19 0244 05/02/19 0224  FERRITIN 119 94   Coags: No results for input(s): INR in the last 72 hours.  Invalid input(s): PT Microbiology: Recent Results (from the past 240 hour(s))  SARS Coronavirus 2 Ag (30 min TAT) - Nasal Swab (BD Veritor Kit)     Status: Abnormal   Collection Time: 04/26/19  3:15 PM   Specimen: Nasal Swab (BD Veritor Kit)  Result Value Ref Range Status   SARS Coronavirus 2 Ag POSITIVE (A) NEGATIVE Final    Comment: RESULT CALLED TO, READ BACK BY AND VERIFIED WITH: Josph Macho RN [phone] PHILLIPS C (NOTE) SARS-CoV-2 antigen PRESENT. Positive results indicate the presence of viral antigens, but clinical correlation with patient history and other diagnostic information is necessary to determine patient infection status.  Positive results do not rule out bacterial infection or co-infection  with other viruses. False positive results are rare but can occur, and confirmatory RT-PCR testing may be appropriate in some circumstances. The expected result is Negative. Fact Sheet for Patients: PodPark.tn Fact Sheet for Providers: GiftContent.is  This test is not yet approved or cleared by the [place] FDA and  has been authorized for detection and/or diagnosis of SARS-CoV-2 by FDA under an Emergency Use Authorization (EUA).  This EUA will remain in effect (meaning this test can be used) for the duration of  the COVID-19 declarati on under Section 564(b)(1) of the Act, 21 U.S.C. section 360bbb-3(b)(1), unless the authorization is terminated or revoked sooner. Performed at [facility], [address] [place], [place] [address]     FURTHER DISCHARGE INSTRUCTIONS:  Get Medicines reviewed and adjusted: Please take all your medications with you for your next visit with your Primary MD  Laboratory/radiological data: Please request your Primary MD to go over all hospital tests and  procedure/radiological results at the follow up, please ask your Primary MD to get all Hospital records sent to his/her office.  In some cases, they will be blood work, cultures and biopsy results pending at the time of your discharge. Please request that your primary care M.D. goes through all the records of your hospital data and follows up on these results.  Also Note the following: If you experience worsening of your admission symptoms, develop shortness of breath, life threatening emergency, suicidal or homicidal thoughts you must seek medical attention immediately by calling 911 or calling your MD immediately  if symptoms less severe.  You must read complete instructions/literature along with all the possible adverse reactions/side effects for all the Medicines you take and that have been prescribed to you. Take any new Medicines after you have completely understood and accpet all the possible adverse reactions/side effects.   Do not drive when taking Pain medications or sleeping medications (Benzodaizepines)  Do not take more than prescribed Pain, Sleep and Anxiety Medications. It is not advisable to combine anxiety,sleep and pain medications without talking with your primary care practitioner  Special Instructions: If you have smoked or chewed Tobacco  in the last 2 yrs please stop smoking, stop any regular Alcohol  and or any Recreational drug use.  Wear Seat belts while driving.  Please note: You were cared for by a hospitalist during your hospital stay. Once you are discharged, your primary care physician will handle any further medical issues. Please note that NO REFILLS for any discharge medications will be authorized once you are discharged, as it is imperative that you return to your primary care physician (or establish a relationship with a primary care physician if you do not have one) for your post hospital discharge needs so that they can reassess your need for medications and  monitor your lab values.  Total Time spent coordinating discharge including counseling, education and face to face time equals 35 minutes.  SignedOren Binet 05/02/2019 10:46 AM

## 2019-05-02 NOTE — Telephone Encounter (Signed)
Noted. Please keep him on Covid follow up list until symptoms have significantly improved.

## 2019-05-03 ENCOUNTER — Ambulatory Visit (HOSPITAL_COMMUNITY)
Admission: RE | Admit: 2019-05-03 | Discharge: 2019-05-03 | Disposition: A | Discharge status: Home / Self Care | Payer: Managed Care, Other (non HMO) | Source: Ambulatory Visit | Attending: Pulmonary Disease | Admitting: Pulmonary Disease

## 2019-05-03 DIAGNOSIS — U071 COVID-19: Secondary | ICD-10-CM | POA: Diagnosis not present

## 2019-05-03 DIAGNOSIS — J1282 Pneumonia due to coronavirus disease 2019: Secondary | ICD-10-CM | POA: Insufficient documentation

## 2019-05-03 MED ORDER — ALBUTEROL SULFATE HFA 108 (90 BASE) MCG/ACT IN AERS: 2.0000 | INHALATION_SPRAY | Freq: Once | RESPIRATORY_TRACT | Status: DC | PRN

## 2019-05-03 MED ORDER — SODIUM CHLORIDE 0.9 % IV SOLN
100.0000 mg | Freq: Once | INTRAVENOUS | Status: AC
  Administered 2019-05-03: 100 mg via INTRAVENOUS
  Filled 2019-05-03: qty 20

## 2019-05-03 MED ORDER — FAMOTIDINE IN NACL 20-0.9 MG/50ML-% IV SOLN: 20.0000 mg | Freq: Once | INTRAVENOUS | Status: DC | PRN

## 2019-05-03 MED ORDER — SODIUM CHLORIDE 0.9 % IV SOLN: INTRAVENOUS | Status: DC | PRN

## 2019-05-03 MED ORDER — METHYLPREDNISOLONE SODIUM SUCC 125 MG IJ SOLR: 125.0000 mg | Freq: Once | INTRAMUSCULAR | Status: DC | PRN

## 2019-05-03 MED ORDER — EPINEPHRINE 0.3 MG/0.3ML IJ SOAJ: 0.3000 mg | Freq: Once | INTRAMUSCULAR | Status: DC | PRN

## 2019-05-03 MED ORDER — DIPHENHYDRAMINE HCL 50 MG/ML IJ SOLN: 50.0000 mg | Freq: Once | INTRAMUSCULAR | Status: DC | PRN

## 2019-05-03 NOTE — Discharge Instructions (Signed)

## 2019-05-03 NOTE — Progress Notes (Signed)
  Diagnosis: COVID-19  Physician: Dr. Joya Gaskins   Procedure: [facility]fusion [facility] Med: remdesivir infusion - Provided patient with remdesivir fact sheet for patients, parents and caregivers prior to infusion.  Complications: No immediate complications noted.  Discharge: Discharged home   Frank Coffey 05/03/2019

## 2019-05-04 ENCOUNTER — Ambulatory Visit (HOSPITAL_COMMUNITY)
Admit: 2019-05-04 | Discharge: 2019-05-04 | Disposition: A | Discharge status: Home / Self Care | Payer: Managed Care, Other (non HMO) | Attending: Pulmonary Disease | Admitting: Pulmonary Disease

## 2019-05-04 DIAGNOSIS — U071 COVID-19: Secondary | ICD-10-CM | POA: Diagnosis not present

## 2019-05-04 MED ORDER — ALBUTEROL SULFATE HFA 108 (90 BASE) MCG/ACT IN AERS: 2.0000 | INHALATION_SPRAY | Freq: Once | RESPIRATORY_TRACT | Status: DC | PRN

## 2019-05-04 MED ORDER — FAMOTIDINE IN NACL 20-0.9 MG/50ML-% IV SOLN: 20.0000 mg | Freq: Once | INTRAVENOUS | Status: DC | PRN

## 2019-05-04 MED ORDER — METHYLPREDNISOLONE SODIUM SUCC 125 MG IJ SOLR: 125.0000 mg | Freq: Once | INTRAMUSCULAR | Status: DC | PRN

## 2019-05-04 MED ORDER — EPINEPHRINE 0.3 MG/0.3ML IJ SOAJ: 0.3000 mg | Freq: Once | INTRAMUSCULAR | Status: DC | PRN

## 2019-05-04 MED ORDER — SODIUM CHLORIDE 0.9 % IV SOLN
100.0000 mg | Freq: Once | INTRAVENOUS | Status: AC
  Administered 2019-05-04: 100 mg via INTRAVENOUS
  Filled 2019-05-04: qty 20

## 2019-05-04 MED ORDER — SODIUM CHLORIDE 0.9 % IV SOLN: INTRAVENOUS | Status: DC | PRN

## 2019-05-04 MED ORDER — DIPHENHYDRAMINE HCL 50 MG/ML IJ SOLN: 50.0000 mg | Freq: Once | INTRAMUSCULAR | Status: DC | PRN

## 2019-05-04 NOTE — Progress Notes (Signed)
  Diagnosis: COVID-19  Physician: Dr. Joya Gaskins  Procedure: [facility] Infusion Clinic Med: remdesivir infusion - Provided patient with remdesivir fact sheet for patients, parents and caregivers prior to infusion.  Complications: No immediate complications noted.  Discharge: Discharged home   [place] 05/04/2019

## 2019-05-05 ENCOUNTER — Telehealth: Payer: Self-pay

## 2019-05-05 NOTE — Telephone Encounter (Signed)
Noted  

## 2019-05-05 NOTE — Telephone Encounter (Signed)
Transition Care Management Follow-up Telephone Call  Date of discharge and from where: [facility]  How have you been since you were released from the hospital? Patient states that he is feeling much better.   Any questions or concerns? No   Items Reviewed:  Did the pt receive and understand the discharge instructions provided? Yes   Medications obtained and verified? Yes   Any new allergies since your discharge? No   Dietary orders reviewed? Yes  Do you have support at home? Yes   Functional Questionnaire: (I = Independent and D = Dependent) ADLs: I  Bathing/Dressing- I  Meal Prep- I  Eating- I  Maintaining continence- I  Transferring/Ambulation- I  Managing Meds- I  Follow up appointments reviewed:   PCP Hospital f/u appt confirmed? Yes  Scheduled to see [name], NP on 05/09/2019 @ 12 pm.  [facility] f/u appt confirmed? Yes  Scheduled to see cardiology  Are transportation arrangements needed? No   If their condition worsens, is the pt aware to call PCP or go to the Emergency Dept.? Yes  Was the patient provided with contact information for the PCP's office or ED? Yes  Was to pt encouraged to call back with questions or concerns? Yes

## 2019-05-06 NOTE — Telephone Encounter (Addendum)
ATC, mailbox full, will call back

## 2019-05-07 ENCOUNTER — Telehealth: Payer: Self-pay

## 2019-05-07 NOTE — Telephone Encounter (Signed)
Pt was diagnosed with covid on 4/10 and was scheduled with [facility] for 4/23 for a hospital f/u. Per most recent guidelines concerning when pt can be scheduled in the clinic. Pt should not be seen in the clinic for a minimum of 21 days from the day of diagnosis.  Pt should not come in the clinic until after 5/1. Jackelyn Poling has openings on the schedule for 5/3. LVM

## 2019-05-07 NOTE — Telephone Encounter (Signed)
Attempted to contact patient, VM box was full - I will try again later.

## 2019-05-09 ENCOUNTER — Inpatient Hospital Stay: Payer: Managed Care, Other (non HMO) | Admitting: Family Medicine

## 2019-05-09 NOTE — Telephone Encounter (Signed)
I attempted to contact patient regarding his symptoms.  Also to follow up on other phone note from Randall An, RN - regarding patient's appt for today - but his VM box was full.

## 2019-05-15 ENCOUNTER — Other Ambulatory Visit: Payer: Self-pay | Admitting: Family Medicine

## 2019-05-15 DIAGNOSIS — M1039 Gout due to renal impairment, multiple sites: Secondary | ICD-10-CM

## 2019-05-22 NOTE — Telephone Encounter (Signed)
Multiple attempts have been made to contact patient without success.  FYI to D. Carlean Purl.  Ok to close encounter?

## 2019-05-23 NOTE — Telephone Encounter (Signed)
Noted  

## 2019-05-28 ENCOUNTER — Other Ambulatory Visit (HOSPITAL_COMMUNITY): Payer: Self-pay | Admitting: Nephrology

## 2019-05-28 ENCOUNTER — Other Ambulatory Visit: Payer: Self-pay | Admitting: Nephrology

## 2019-05-28 DIAGNOSIS — R188 Other ascites: Secondary | ICD-10-CM

## 2019-05-28 DIAGNOSIS — I5042 Chronic combined systolic (congestive) and diastolic (congestive) heart failure: Secondary | ICD-10-CM

## 2019-05-29 ENCOUNTER — Other Ambulatory Visit: Payer: Self-pay | Admitting: Family Medicine

## 2019-05-29 DIAGNOSIS — M1039 Gout due to renal impairment, multiple sites: Secondary | ICD-10-CM

## 2019-07-15 ENCOUNTER — Other Ambulatory Visit: Payer: Self-pay

## 2019-07-15 ENCOUNTER — Encounter (HOSPITAL_BASED_OUTPATIENT_CLINIC_OR_DEPARTMENT_OTHER): Payer: Self-pay | Admitting: Emergency Medicine

## 2019-07-15 ENCOUNTER — Emergency Department (HOSPITAL_BASED_OUTPATIENT_CLINIC_OR_DEPARTMENT_OTHER)
Admission: EM | Admit: 2019-07-15 | Discharge: 2019-07-15 | Disposition: A | Discharge status: Home / Self Care | Payer: Managed Care, Other (non HMO) | Attending: Emergency Medicine | Admitting: Emergency Medicine

## 2019-07-15 DIAGNOSIS — N184 Chronic kidney disease, stage 4 (severe): Secondary | ICD-10-CM | POA: Insufficient documentation

## 2019-07-15 DIAGNOSIS — Z7982 Long term (current) use of aspirin: Secondary | ICD-10-CM | POA: Diagnosis not present

## 2019-07-15 DIAGNOSIS — I5020 Unspecified systolic (congestive) heart failure: Secondary | ICD-10-CM | POA: Diagnosis not present

## 2019-07-15 DIAGNOSIS — Z79899 Other long term (current) drug therapy: Secondary | ICD-10-CM | POA: Diagnosis not present

## 2019-07-15 DIAGNOSIS — R1013 Epigastric pain: Secondary | ICD-10-CM | POA: Insufficient documentation

## 2019-07-15 DIAGNOSIS — I130 Hypertensive heart and chronic kidney disease with heart failure and stage 1 through stage 4 chronic kidney disease, or unspecified chronic kidney disease: Secondary | ICD-10-CM | POA: Diagnosis not present

## 2019-07-15 LAB — URINALYSIS, ROUTINE W REFLEX MICROSCOPIC
Bilirubin Urine: NEGATIVE
Glucose, UA: NEGATIVE mg/dL
Hgb urine dipstick: NEGATIVE
Ketones, ur: NEGATIVE mg/dL
Leukocytes,Ua: NEGATIVE
Nitrite: NEGATIVE
Protein, ur: 30 mg/dL — AB
Specific Gravity, Urine: 1.02 (ref 1.005–1.030)
pH: 6 (ref 5.0–8.0)

## 2019-07-15 LAB — CBC
HCT: 43.7 % (ref 39.0–52.0)
Hemoglobin: 13 g/dL (ref 13.0–17.0)
MCH: 22.8 pg — ABNORMAL LOW (ref 26.0–34.0)
MCHC: 29.7 g/dL — ABNORMAL LOW (ref 30.0–36.0)
MCV: 76.8 fL — ABNORMAL LOW (ref 80.0–100.0)
Platelets: 299 10*3/uL (ref 150–400)
RBC: 5.69 MIL/uL (ref 4.22–5.81)
RDW: 18.6 % — ABNORMAL HIGH (ref 11.5–15.5)
WBC: 9.7 10*3/uL (ref 4.0–10.5)
nRBC: 0 % (ref 0.0–0.2)

## 2019-07-15 LAB — COMPREHENSIVE METABOLIC PANEL
ALT: 21 U/L (ref 0–44)
AST: 24 U/L (ref 15–41)
Albumin: 3.4 g/dL — ABNORMAL LOW (ref 3.5–5.0)
Alkaline Phosphatase: 87 U/L (ref 38–126)
Anion gap: 12 (ref 5–15)
BUN: 41 mg/dL — ABNORMAL HIGH (ref 6–20)
CO2: 30 mmol/L (ref 22–32)
Calcium: 8.6 mg/dL — ABNORMAL LOW (ref 8.9–10.3)
Chloride: 97 mmol/L — ABNORMAL LOW (ref 98–111)
Creatinine, Ser: 4.57 mg/dL — ABNORMAL HIGH (ref 0.61–1.24)
GFR calc Af Amer: 17 mL/min — ABNORMAL LOW (ref >60)
GFR calc non Af Amer: 15 mL/min — ABNORMAL LOW (ref >60)
Glucose, Bld: 96 mg/dL (ref 70–99)
Potassium: 3.1 mmol/L — ABNORMAL LOW (ref 3.5–5.1)
Sodium: 139 mmol/L (ref 135–145)
Total Bilirubin: 0.8 mg/dL (ref 0.3–1.2)
Total Protein: 7.7 g/dL (ref 6.5–8.1)

## 2019-07-15 LAB — URINALYSIS, MICROSCOPIC (REFLEX): WBC, UA: NONE SEEN WBC/hpf (ref 0–5)

## 2019-07-15 LAB — LIPASE, BLOOD: Lipase: 28 U/L (ref 11–51)

## 2019-07-15 MED ORDER — SODIUM CHLORIDE 0.9% FLUSH
3.0000 mL | Freq: Once | INTRAVENOUS | Status: DC
  Filled 2019-07-15: qty 3

## 2019-07-15 NOTE — ED Triage Notes (Signed)
Upper abdominal pain and blood in stool for 2-3 days. Sts he can't have a bm without using laxatives and when he does he has diarrhea.

## 2019-07-15 NOTE — ED Provider Notes (Signed)
[facility] EMERGENCY DEPARTMENT Provider Note   CSN: [id number] Arrival date & time: 07/15/19  1617     History Chief Complaint  Patient presents with  . Abdominal Pain    Frank Coffey is a 39 y.o. male.  39 yo M with a chief complaints of epigastric abdominal pain and constipation.  This been a recurrent problem for him.  He was here a few months ago with the same.  States that he has had improvement off and on with Linzess but he is concerned about the amount of times that he has to take it.  He unfortunately has renal disease and is nonspecific and much fluid.  Describing some epigastric uncomfortable this the feels like bloating.  Had some bright red blood per rectum over the past 48 hours.  Has had a hemorrhoid with that in the past.  Denies itching or burning at the rectum.  The history is provided by the patient.  Abdominal Pain Pain location:  Epigastric Pain quality: aching and bloating   Pain radiates to:  Does not radiate Pain severity:  Moderate Onset quality:  Gradual Duration:  8 weeks Timing:  Intermittent Progression:  Waxing and waning Chronicity:  Recurrent Relieved by: laxatives. Worsened by:  Nothing Ineffective treatments:  None tried Associated symptoms: constipation and diarrhea   Associated symptoms: no chest pain, no chills, no fever, no shortness of breath and no vomiting        Past Medical History:  Diagnosis Date  . CHF (congestive heart failure) ([place])   . Chronic kidney disease, stage 3, mod decreased GFR   . Gout   . 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 Hx of migraine headaches   . Hyperlipidemia   . Hypertension   . Hypertensive heart disease with congestive heart failure and stage 3 kidney disease ([place])   . Obesity   . OSA (obstructive sleep apnea)     Patient Active Problem List   Diagnosis Date Noted  . Acute hypoxemic respiratory failure due to COVID-19 ([place]) 04/30/2019  . Pneumonia due to COVID-19 virus 04/30/2019  .  Hyperlipidemia 04/30/2019  . Elevated troponin 04/30/2019  . Mobitz type 1 second degree atrioventricular block 11/18/2017  . TIA (transient ischemic attack) 11/14/2017  . Hypertensive urgency 11/14/2017  . Migraine 10/02/2016  . Erectile dysfunction 10/02/2016  . Annual physical exam 02/22/2016  . Onychomycosis 02/22/2016  . Pre-operative cardiovascular examination 07/19/2015  . NICM (nonischemic cardiomyopathy) ([place]) 07/19/2015  . Morbid obesity due to excess calories ([place]) 07/19/2015  . 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 Chronic systolic CHF (congestive heart failure) ([place]) 12/19/2012  . Benign hypertension with chronic kidney disease, stage IV ([place]) 12/19/2012  . Gout 12/19/2012  . CKD (chronic kidney disease) stage 4, GFR 15-29 ml/min ([facility]) 12/19/2012  . OSA (obstructive sleep apnea) 12/19/2012    Past Surgical History:  Procedure Laterality Date  . CHOLECYSTECTOMY    . RETINAL DETACHMENT REPAIR W/ SCLERAL BUCKLE LE     Left       Family History  Problem Relation Age of Onset  . Hypertension Mother   . Hyperlipidemia Mother   . Heart disease Mother   . Hypertension Maternal Grandmother   . Stroke Maternal Grandmother   . Heart disease Maternal Grandmother   . Hyperlipidemia Maternal Grandmother   . Stroke Maternal Grandfather   . Hypertension Maternal Grandfather   . Heart disease Maternal Grandfather   . Hyperlipidemia Maternal Grandfather   . Alzheimer's disease Maternal Grandfather   .  Cancer - Other Paternal Grandmother   . Healthy Brother        x2  . Healthy Sister        x2  . Healthy Son        x2  . Healthy Daughter        x1  . Diabetes Neg Hx   . Heart attack Neg Hx   . Sudden death Neg Hx     Social History   Tobacco Use  . Smoking status: Never Smoker  . Smokeless tobacco: Never Used  Vaping Use  . Vaping Use: Never used  Substance Use Topics  . Alcohol use: Yes    Comment: occ  . Drug use: No    Home  Medications Prior to Admission medications   Medication Sig Start Date End Date Taking? Authorizing Provider  allopurinol (ZYLOPRIM) 300 MG tablet TAKE 1 TABLET(300 MG) BY MOUTH DAILY 05/31/19   Elby Beck, FNP  aspirin 81 MG tablet Take 1 tablet (81 mg total) by mouth daily. 11/16/17   Samuella Cota, MD  atorvastatin (LIPITOR) 20 MG tablet Take 1 tablet (20 mg total) by mouth daily. 05/29/18   Elby Beck, FNP  benzonatate (TESSALON PERLES) 100 MG capsule Take 1 capsule (100 mg total) by mouth every 6 (six) hours as needed for cough. 05/02/19 05/01/20  Ghimire, Henreitta Leber, MD  brimonidine (ALPHAGAN) 0.2 % ophthalmic solution Place 1 drop into the left eye 2 (two) times daily.  03/19/19   [provider]  carvedilol (COREG) 25 MG tablet Take 25 mg by mouth 2 (two) times daily with a meal.  11/20/17   [provider]  dorzolamide-timolol (COSOPT) 22.3-6.8 MG/ML ophthalmic solution Place 1 drop into the left eye 2 (two) times daily.  03/10/19   [provider]  DUREZOL 0.05 % EMUL Place 1 drop into the left eye 4 (four) times daily. 03/10/19   [provider]  EQ FIBER SUPPLEMENT PO Take 2 tablets by mouth daily.     [provider]  fluticasone (FLONASE) 50 MCG/ACT nasal spray Place 2 sprays into both nostrils daily. 04/02/19   Copland, Frederico Hamman, MD  hydrALAZINE (APRESOLINE) 100 MG tablet Take 1 tablet (100 mg total) by mouth 3 (three) times daily. 11/16/17   Samuella Cota, MD  latanoprost (XALATAN) 0.005 % ophthalmic solution Place 1 drop into the left eye at bedtime. 04/14/19   [provider]  NIFEdipine (ADALAT CC) 60 MG 24 hr tablet Take 1 tablet (60 mg total) by mouth at bedtime. 05/02/19 05/01/20  Ghimire, Henreitta Leber, MD  potassium chloride SA (K-DUR) 20 MEQ tablet Take 1 tablet (20 mEq total) by mouth 2 (two) times daily. 09/03/18   Horton, Barbette Hair, MD  sildenafil (VIAGRA) 25 MG tablet Take 1 tablet (25 mg total) by mouth daily as  needed for erectile dysfunction. 10/02/16   Brunetta Jeans, PA-C  spironolactone (ALDACTONE) 25 MG tablet Take 25 mg by mouth 2 (two) times daily.    [provider]  torsemide (DEMADEX) 20 MG tablet TAKE 2 TABLETS(40 MG) BY MOUTH TWICE DAILY Patient taking differently: Take 40 mg by mouth 2 (two) times daily.  09/16/18   Elby Beck, FNP    Allergies    Patient has no known allergies.  Review of Systems   Review of Systems  Constitutional: Negative for chills and fever.  HENT: Negative for congestion and facial swelling.   Eyes: Negative for discharge and visual disturbance.  Respiratory: Negative for shortness of breath.   Cardiovascular: Negative for chest pain and palpitations.  Gastrointestinal: Positive for abdominal pain, blood in stool, constipation and diarrhea. Negative for vomiting.  Musculoskeletal: Negative for arthralgias and myalgias.  Skin: Negative for color change and rash.  Neurological: Negative for tremors, syncope and headaches.  Psychiatric/Behavioral: Negative for confusion and dysphoric mood.    Physical Exam Updated Vital Signs BP (!) 174/120 (BP Location: Right Arm)   Pulse 76   Temp 98.3 F (36.8 C) (Oral)   Resp 18   Ht 5\' 8"  (1.727 m)   Wt (!) 174.4 kg   SpO2 97%   BMI 58.45 kg/m   Physical Exam Vitals and nursing note reviewed.  Constitutional:      Appearance: He is well-developed. He is morbidly obese.  HENT:     Head: Normocephalic and atraumatic.  Eyes:     Pupils: Pupils are equal, round, and reactive to light.  Neck:     Vascular: No JVD.  Cardiovascular:     Rate and Rhythm: Normal rate and regular rhythm.     Heart sounds: No murmur heard.  No friction rub. No gallop.   Pulmonary:     Effort: No respiratory distress.     Breath sounds: No wheezing.  Abdominal:     General: There is no distension.     Tenderness: There is no abdominal tenderness. There is no guarding or rebound.  Genitourinary:     Comments: No hemorrhoids no gross blood. Musculoskeletal:        General: Normal range of motion.     Cervical back: Normal range of motion and neck supple.  Skin:    Coloration: Skin is not pale.     Findings: No rash.  Neurological:     Mental Status: He is alert and oriented to person, place, and time.  Psychiatric:        Behavior: Behavior normal.     ED Results / Procedures / Treatments   Labs (all labs ordered are listed, but only abnormal results are displayed) Labs Reviewed  COMPREHENSIVE METABOLIC PANEL - Abnormal; Notable for the following components:      Result Value   Potassium 3.1 (*)    Chloride 97 (*)    BUN 41 (*)    Creatinine, Ser 4.57 (*)    Calcium 8.6 (*)    Albumin 3.4 (*)    GFR calc non Af Amer 15 (*)    GFR calc Af Amer 17 (*)    All other components within normal limits  CBC - Abnormal; Notable for the following components:   MCV 76.8 (*)    MCH 22.8 (*)    MCHC 29.7 (*)    RDW 18.6 (*)    All other components within normal limits  URINALYSIS, ROUTINE W REFLEX MICROSCOPIC - Abnormal; Notable for the following components:   Protein, ur 30 (*)    All other components within normal limits  URINALYSIS, MICROSCOPIC (REFLEX) - Abnormal; Notable for the following components:   Bacteria, UA RARE (*)    All other components within normal limits  LIPASE, BLOOD    EKG None  Radiology No results found.  Procedures Procedures (including critical care time)  Medications Ordered in ED Medications  sodium chloride flush (NS) 0.9 % injection 3 mL (has no administration in time range)    ED Course  I have reviewed the triage vital signs and the nursing notes.  Pertinent labs & imaging results  that were available during my care of the patient were reviewed by me and considered in my medical decision making (see chart for details).    MDM Rules/Calculators/A&P                          39 yo M with chief complaints of chronic abdominal pain.   Seems to alternate between constipation and diarrhea.  I suggested switching from Linzess to [place].  His lab work here is without acute hepatitis or pancreatitis.  No loss of blood.  10:14 PM:  I have discussed the diagnosis/risks/treatment options with the patient and believe the pt to be eligible for discharge home to follow-up with PCP, GI. We also discussed returning to the ED immediately if new or worsening sx occur. We discussed the sx which are most concerning (e.g., sudden worsening pain, fever, inability to tolerate by mouth) that necessitate immediate return. Medications administered to the patient during their visit and any new prescriptions provided to the patient are listed below.  Medications given during this visit Medications  sodium chloride flush (NS) 0.9 % injection 3 mL (has no administration in time range)     The patient appears reasonably screen and/or stabilized for discharge and I doubt any other medical condition or other [facility] requiring further screening, evaluation, or treatment in the ED at this time prior to discharge.   Final Clinical Impression(s) / ED Diagnoses Final diagnoses:  Epigastric pain    Rx / DC Orders ED Discharge Orders    None       Deno Etienne, DO 07/15/19 2214

## 2019-07-15 NOTE — Discharge Instructions (Signed)
Tried to switch from [place] to [place].  Take as prescribed on the bottle.  Cap in 8 ounces of water.  Typically takes about 3 to 4 days to have big bowel movements.  This may be something that you need to take every day but it could take less or more depending on how much you need.  Please call the gastroenterologist and set up an appointment.  As you have had this problem off and on for some time they may need to evaluate you for possible further testing for colonoscopy or endoscopy.  Please return for worsening bleeding or if you feel you are in a pass out or do pass out.

## 2019-07-15 NOTE — ED Notes (Signed)
Placed in exam room 1, presents with abd pain, intermittently for the past several months, states he has a lot of constipation, has been prescribed by his Gastroenterologist "Linsess" for his IBS, states it no longer works, when he called his GI MD in regards to having continued issues, was instructed to come to ED for Ultrasound Evaluation.

## 2019-08-26 ENCOUNTER — Telehealth: Payer: Self-pay | Admitting: Family Medicine

## 2019-08-26 NOTE — Telephone Encounter (Signed)
ok 

## 2019-08-26 NOTE — Telephone Encounter (Signed)
Patient is calling to see if he can do a TOC to our office because it's closer to his home. Please advise if the transfer is okay.

## 2019-08-27 NOTE — Telephone Encounter (Signed)
That is fine with me.

## 2019-08-27 NOTE — Telephone Encounter (Signed)
Called patient to schedule an appointment for TOC, no answer, mailbox full. If patient calls back please schedule with [name] (TOC approved).

## 2019-10-02 ENCOUNTER — Emergency Department (HOSPITAL_BASED_OUTPATIENT_CLINIC_OR_DEPARTMENT_OTHER)
Admission: EM | Admit: 2019-10-02 | Discharge: 2019-10-02 | Disposition: A | Discharge status: Home / Self Care | Payer: Managed Care, Other (non HMO) | Attending: Emergency Medicine | Admitting: Emergency Medicine

## 2019-10-02 ENCOUNTER — Other Ambulatory Visit: Payer: Self-pay

## 2019-10-02 ENCOUNTER — Encounter (HOSPITAL_BASED_OUTPATIENT_CLINIC_OR_DEPARTMENT_OTHER): Payer: Self-pay

## 2019-10-02 DIAGNOSIS — R519 Headache, unspecified: Secondary | ICD-10-CM | POA: Insufficient documentation

## 2019-10-02 DIAGNOSIS — R197 Diarrhea, unspecified: Secondary | ICD-10-CM | POA: Diagnosis not present

## 2019-10-02 DIAGNOSIS — Z5321 Procedure and treatment not carried out due to patient leaving prior to being seen by health care provider: Secondary | ICD-10-CM | POA: Diagnosis not present

## 2019-10-02 DIAGNOSIS — Z8616 Personal history of COVID-19: Secondary | ICD-10-CM | POA: Diagnosis not present

## 2019-10-02 DIAGNOSIS — R5383 Other fatigue: Secondary | ICD-10-CM | POA: Diagnosis not present

## 2019-10-02 DIAGNOSIS — Z20822 Contact with and (suspected) exposure to covid-19: Secondary | ICD-10-CM | POA: Diagnosis not present

## 2019-10-02 LAB — SARS CORONAVIRUS 2 BY RT PCR (HOSPITAL ORDER, PERFORMED IN ~~LOC~~ HOSPITAL LAB): SARS Coronavirus 2: NEGATIVE

## 2019-10-02 NOTE — ED Notes (Signed)
Approached pt to recheck VS. Pt states he received his results in my chart and doesn't wish to be seen. Alert. NAD. Ambulated from department independently

## 2019-10-02 NOTE — ED Triage Notes (Signed)
Pt c/o HA, fatigue, diarrhea x 2 days. PT has positive COVID contact. Pt has hx of COVID 4 months prior and has received 1 dose of vaccine.

## 2019-10-13 ENCOUNTER — Ambulatory Visit (INDEPENDENT_AMBULATORY_CARE_PROVIDER_SITE_OTHER): Payer: Managed Care, Other (non HMO) | Admitting: Nurse Practitioner

## 2019-10-13 ENCOUNTER — Other Ambulatory Visit: Payer: Self-pay

## 2019-10-13 ENCOUNTER — Encounter: Payer: Self-pay | Admitting: Nurse Practitioner

## 2019-10-13 VITALS — BP 138/74 | HR 95 | Temp 97.0°F | Ht 69.0 in | Wt 390.4 lb

## 2019-10-13 DIAGNOSIS — N184 Chronic kidney disease, stage 4 (severe): Secondary | ICD-10-CM | POA: Diagnosis not present

## 2019-10-13 DIAGNOSIS — K5904 Chronic idiopathic constipation: Secondary | ICD-10-CM | POA: Diagnosis not present

## 2019-10-13 DIAGNOSIS — K5909 Other constipation: Secondary | ICD-10-CM | POA: Diagnosis not present

## 2019-10-13 DIAGNOSIS — M1039 Gout due to renal impairment, multiple sites: Secondary | ICD-10-CM | POA: Diagnosis not present

## 2019-10-13 LAB — BASIC METABOLIC PANEL WITH GFR
BUN: 30 mg/dL — ABNORMAL HIGH (ref 6–23)
CO2: 27 meq/L (ref 19–32)
Calcium: 8.7 mg/dL (ref 8.4–10.5)
Chloride: 102 meq/L (ref 96–112)
Creatinine, Ser: 3.56 mg/dL — ABNORMAL HIGH (ref 0.40–1.50)
GFR: 22.94 mL/min — ABNORMAL LOW
Glucose, Bld: 96 mg/dL (ref 70–99)
Potassium: 4.1 meq/L (ref 3.5–5.1)
Sodium: 139 meq/L (ref 135–145)

## 2019-10-13 LAB — PHOSPHORUS: Phosphorus: 4.4 mg/dL (ref 2.3–4.6)

## 2019-10-13 LAB — URIC ACID: Uric Acid, Serum: 5.9 mg/dL (ref 4.0–7.8)

## 2019-10-13 MED ORDER — LINACLOTIDE 145 MCG PO CAPS: 145.0000 ug | ORAL_CAPSULE | Freq: Every day | ORAL | 5 refills | Status: DC

## 2019-10-13 MED ORDER — PREDNISONE 20 MG PO TABS: 20.0000 mg | ORAL_TABLET | Freq: Every day | ORAL | 0 refills | Status: DC

## 2019-10-13 NOTE — Progress Notes (Signed)
Subjective:  Patient ID: Frank Coffey, male    DOB: [birth year]  Age: 39 y.o. MRN: [id number]  CC: Establish Care (TOC-[place]/Pt here to discuss his IBS, states it has not flared up in the past few days but over the past few months it has been bad. Pt states he has been drinking prune juice, taking miralax, and a fiber supplement along with walking and that has helped some. Had to discontinue walking recently due to a gout flare up. )  Constipation This is a chronic problem. The current episode started more than 1 year ago. The problem has been waxing and waning since onset. His stool frequency is 2 to 3 times per week. The stool is described as firm and pellet like. The patient is not on a high fiber diet. He does not exercise regularly. There has been adequate water intake. Associated symptoms include abdominal pain, anorexia, bloating, hematochezia, hemorrhoids and nausea. Pertinent negatives include no back pain, diarrhea, fecal incontinence, fever, melena, rectal pain, vomiting or weight loss. Risk factors include immobility and obesity. He has tried fiber, laxatives, stool softeners, enemas and diet changes for the symptoms. The treatment provided moderate relief. His past medical history is significant for irritable bowel syndrome.   Also complains of right 1st toe pain x 1week due to acute gout flare. reports he is complaint with allopurinol. Admits to drinking alcohol which caused acute flare. He denies any foot injury  Reviewed past Medical, Social and Family history today.  Outpatient Medications Prior to Visit  Medication Sig Dispense Refill  . allopurinol (ZYLOPRIM) 300 MG tablet TAKE 1 TABLET(300 MG) BY MOUTH DAILY 90 tablet 0  . aspirin 81 MG tablet Take 1 tablet (81 mg total) by mouth daily.    [name] atorvastatin (LIPITOR) 20 MG tablet Take 1 tablet (20 mg total) by mouth daily. 90 tablet 3  . benzonatate (TESSALON PERLES) 100 MG capsule Take 1 capsule (100 mg total) by mouth  every 6 (six) hours as needed for cough. 30 capsule 0  . brimonidine (ALPHAGAN) 0.2 % ophthalmic solution Place 1 drop into the left eye 2 (two) times daily.     . carvedilol (COREG) 25 MG tablet Take 25 mg by mouth 2 (two) times daily with a meal.     . dorzolamide-timolol (COSOPT) 22.3-6.8 MG/ML ophthalmic solution Place 1 drop into the left eye 2 (two) times daily.     . DUREZOL 0.05 % EMUL Place 1 drop into the left eye 4 (four) times daily.    [name] FIBER SUPPLEMENT PO Take 2 tablets by mouth daily.     . fluticasone (FLONASE) 50 MCG/ACT nasal spray Place 2 sprays into both nostrils daily. 16 g 1  . hydrALAZINE (APRESOLINE) 100 MG tablet Take 1 tablet (100 mg total) by mouth 3 (three) times daily. 90 tablet 0  . latanoprost (XALATAN) 0.005 % ophthalmic solution Place 1 drop into the left eye at bedtime.    [name] NIFEdipine (ADALAT CC) 60 MG 24 hr tablet Take 1 tablet (60 mg total) by mouth at bedtime. 30 tablet 0  . potassium chloride SA (K-DUR) 20 MEQ tablet Take 1 tablet (20 mEq total) by mouth 2 (two) times daily. 10 tablet 0  . spironolactone (ALDACTONE) 25 MG tablet Take 25 mg by mouth 2 (two) times daily.    [name] torsemide (DEMADEX) 20 MG tablet TAKE 2 TABLETS(40 MG) BY MOUTH TWICE DAILY (Patient taking differently: Take 40 mg by mouth 2 (two) times daily. )  120 tablet 1  . sildenafil (VIAGRA) 25 MG tablet Take 1 tablet (25 mg total) by mouth daily as needed for erectile dysfunction. (Patient not taking: Reported on 10/13/2019) 10 tablet 0   No facility-administered medications prior to visit.   ROS See HPI  Objective:  BP 138/74 (BP Location: Left Arm, Patient Position: Sitting, Cuff Size: Normal)   Pulse 95   Temp (!) 97 F (36.1 C) (Temporal)   Ht 5\' 9"  (1.753 m)   Wt (!) 390 lb 6.4 oz (177.1 kg)   SpO2 (!) 70%   BMI 57.65 kg/m   Physical Exam Constitutional:      Appearance: He is obese.  Pulmonary:     Effort: Pulmonary effort is normal.  Abdominal:     General: Bowel  sounds are normal. There is no distension.     Palpations: Abdomen is soft.     Tenderness: There is abdominal tenderness. There is no guarding.  Musculoskeletal:        General: Swelling and tenderness present.     Right lower leg: Edema present.     Left lower leg: Edema present.       Feet:  Feet:     Right foot:     Toenail Condition: Right toenails are abnormally thick and long.     Left foot:     Toenail Condition: Left toenails are abnormally thick and long.  Neurological:     Mental Status: He is alert.    Assessment & Plan:  This visit occurred during the SARS-CoV-2 public health emergency.  Safety protocols were in place, including screening questions prior to the visit, additional usage of staff PPE, and extensive cleaning of exam room while observing appropriate contact time as indicated for disinfecting solutions.   Frank Coffey was seen today for establish care.  Diagnoses and all orders for this visit:  Chronic constipation -     linaclotide (LINZESS) 145 MCG CAPS capsule; Take 1 capsule (145 mcg total) by mouth daily before breakfast. -     Ambulatory referral to Gastroenterology  Chronic idiopathic constipation  CKD (chronic kidney disease) stage 4, GFR 15-29 ml/min (HCC) -     Basic metabolic panel -     Phosphorus  Acute gout due to renal impairment involving multiple sites -     Uric acid -     predniSONE (DELTASONE) 20 MG tablet; Take 1 tablet (20 mg total) by mouth daily with breakfast.  Stable renal function, uric acid and glucose. Maintain appts with nephrology Oral prednisone sent for acute gout. Avoid ETOH and high purine foods Problem List Items Addressed This Visit      Digestive   Chronic idiopathic constipation     Genitourinary   CKD (chronic kidney disease) stage 4, GFR 15-29 ml/min (HCC) (Chronic)   Relevant Orders   Basic metabolic panel (Completed)   Phosphorus (Completed)     Other   Gout   Relevant Medications   predniSONE  (DELTASONE) 20 MG tablet   Other Relevant Orders   Uric acid (Completed)    Other Visit Diagnoses    Chronic constipation    -  Primary   Relevant Medications   linaclotide (LINZESS) 145 MCG CAPS capsule   Other Relevant Orders   Ambulatory referral to Gastroenterology     Follow-up: Return if symptoms worsen or fail to improve.  [name], NP

## 2019-10-13 NOTE — Patient Instructions (Addendum)
Start linzess Maintain high fiber diet You will be contacted to schedule appt with GI.  Constipation, Adult Constipation is when a person has fewer bowel movements in a week than normal, has difficulty having a bowel movement, or has stools that are dry, hard, or larger than normal. Constipation may be caused by an underlying condition. It may become worse with age if a person takes certain medicines and does not take in enough fluids. Follow these instructions at home: Eating and drinking   Eat foods that have a lot of fiber, such as fresh fruits and vegetables, whole grains, and beans.  Limit foods that are high in fat, low in fiber, or overly processed, such as french fries, hamburgers, cookies, candies, and soda.  Drink enough fluid to keep your urine clear or pale yellow. General instructions  Exercise regularly or as told by your health care provider.  Go to the restroom when you have the urge to go. Do not hold it in.  Take over-the-counter and prescription medicines only as told by your health care provider. These include any fiber supplements.  Practice pelvic floor retraining exercises, such as deep breathing while relaxing the lower abdomen and pelvic floor relaxation during bowel movements.  Watch your condition for any changes.  Keep all follow-up visits as told by your health care provider. This is important. Contact a health care provider if:  You have pain that gets worse.  You have a fever.  You do not have a bowel movement after 4 days.  You vomit.  You are not hungry.  You lose weight.  You are bleeding from the anus.  You have thin, pencil-like stools. Get help right away if:  You have a fever and your symptoms suddenly get worse.  You leak stool or have blood in your stool.  Your abdomen is bloated.  You have severe pain in your abdomen.  You feel dizzy or you faint. This information is not intended to replace advice given to you by your  health care provider. Make sure you discuss any questions you have with your health care provider. Document Revised: 12/15/2016 Document Reviewed: 06/23/2015 Elsevier Patient Education  2020 [employer].

## 2019-10-26 IMAGING — CT CT HEAD W/O CM
3 series · 15 of 47 positions shown, 18 images · non-contrast
Comparison: Head CT 08/21/2015

CLINICAL DATA: Headache and syncope

EXAM:
CT HEAD WITHOUT CONTRAST
TECHNIQUE: Contiguous axial images were obtained from the base of the skull
through the vertex without intravenous contrast.

[Series 2: head wo · axial · 0.47mm/px · z∈[-159,-29]mm · 9 of 32 slices shown, 12 images]
[im 3/32  brain]
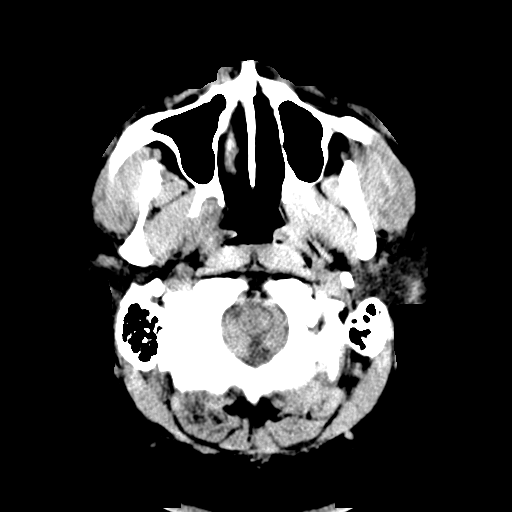
[im 3/32  bone]
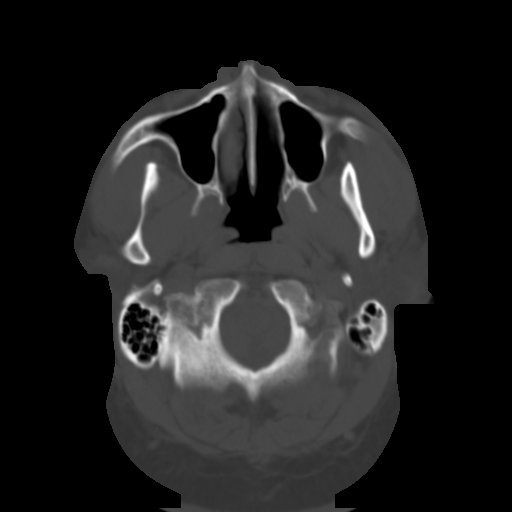
[im 6/32  brain]
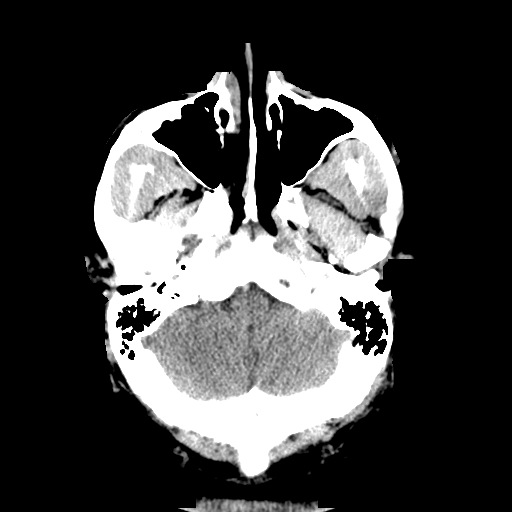
[im 9/32  brain]
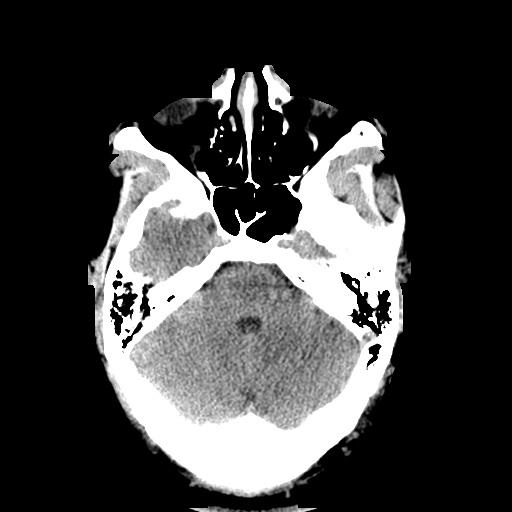
[im 12/32  brain]
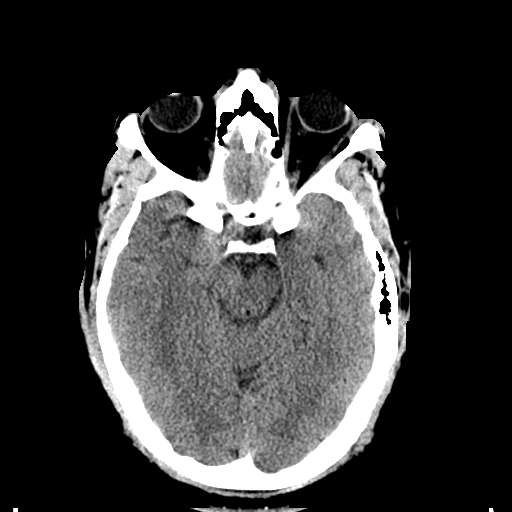
[im 17/32  brain]
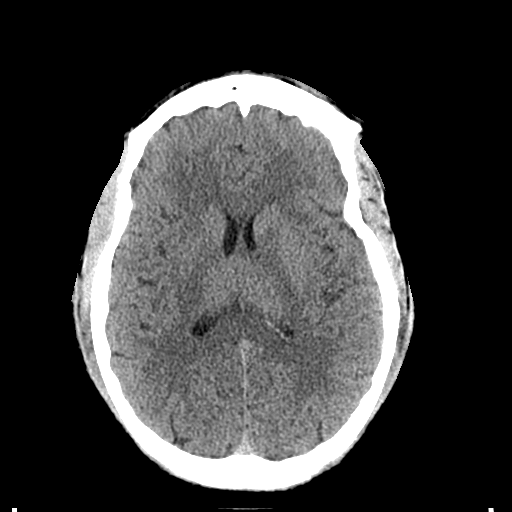
[im 17/32  bone]
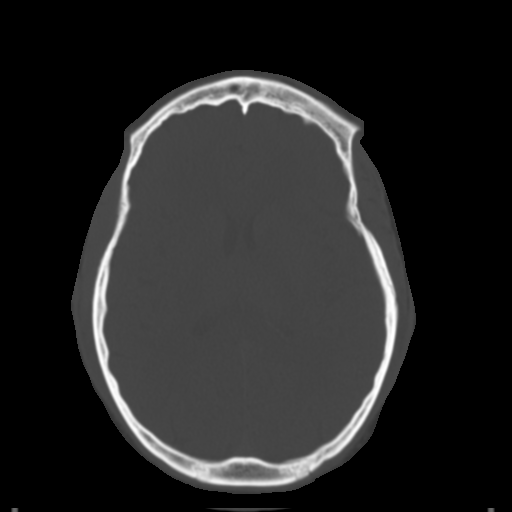
[im 20/32  brain]
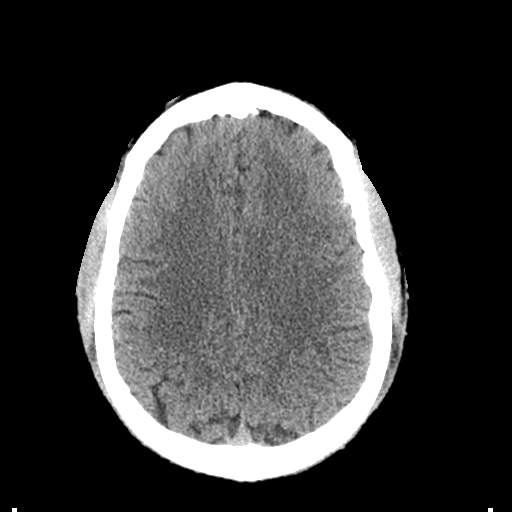
[im 23/32  brain]
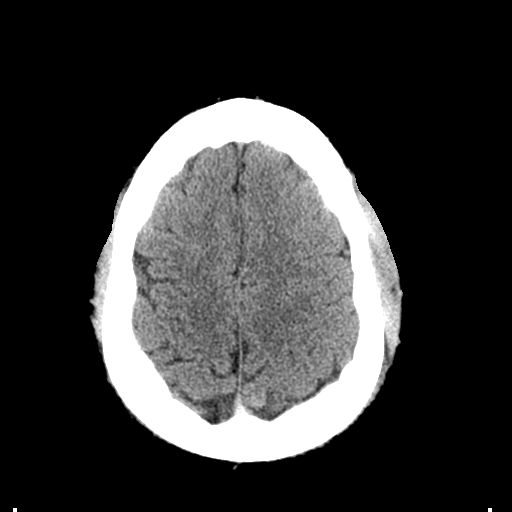
[im 26/32  brain]
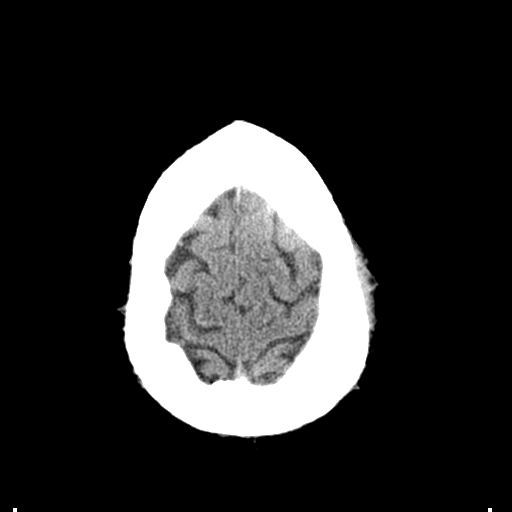
[im 29/32  brain]
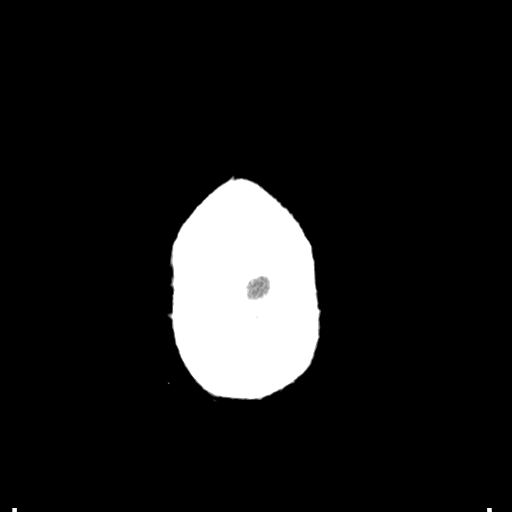
[im 29/32  bone]
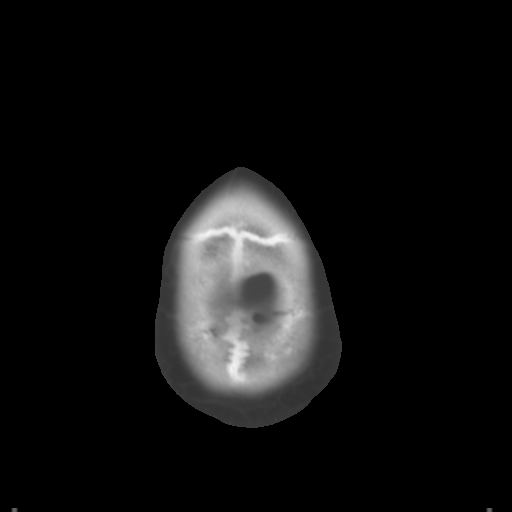

[Series 5: coronal soft tissue · coronal · 0.32mm/px · 3 of 65 slices shown]
[im 22/65  brain]
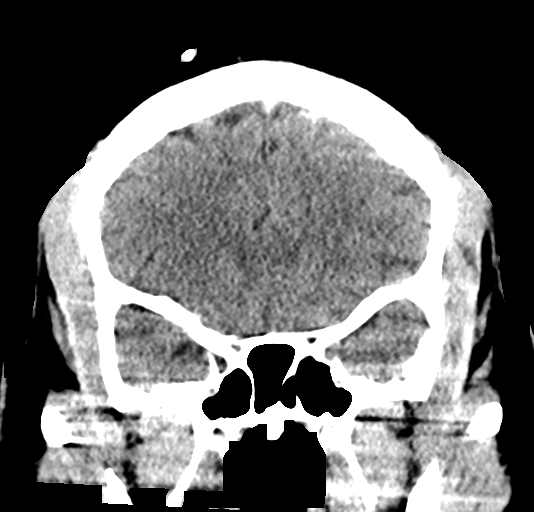
[im 29/65  brain]
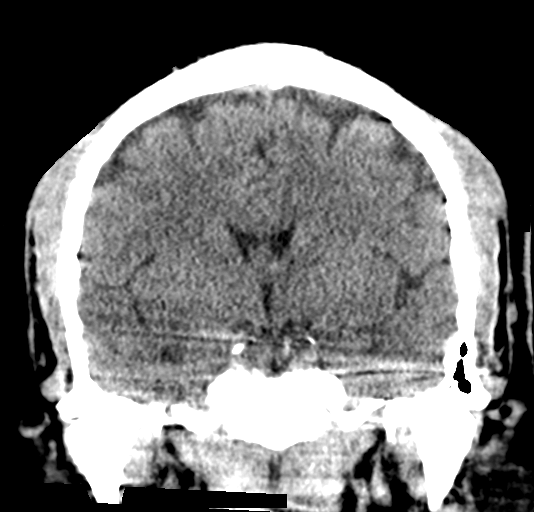
[im 36/65  brain]
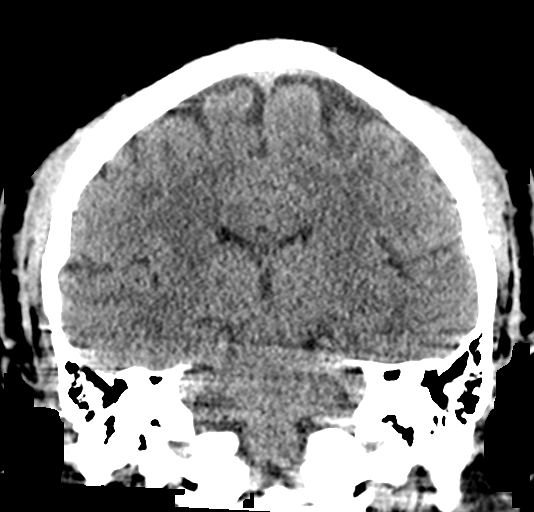

[Series 6: sagittal soft tissue · sagittal · 0.34mm/px · 3 of 46 slices shown]
[im 16/46  brain]
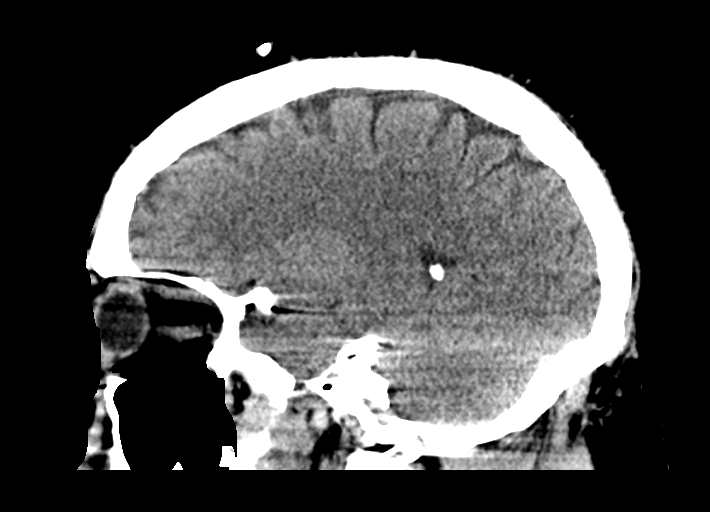
[im 23/46  brain]
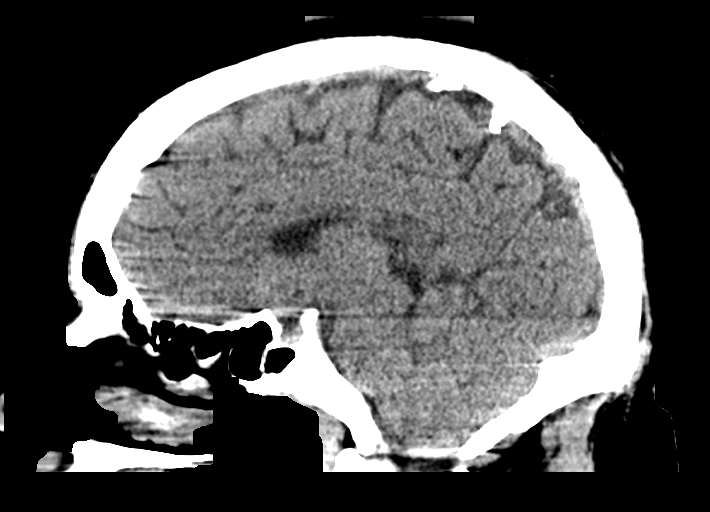
[im 31/46  brain]
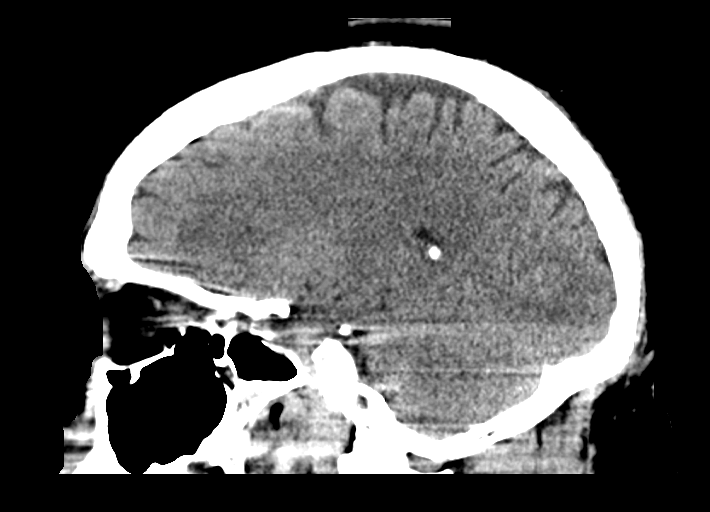

[15 of 47 positions shown; findings below may reference images not displayed]

FINDINGS: Brain: No mass lesion, intraparenchymal hemorrhage or extra-axial
collection. No evidence of acute cortical infarct. Brain parenchyma
and CSF-containing spaces are normal for age.

Vascular: No hyperdense vessel or unexpected calcification.

Skull: Normal visualized skull base, calvarium and extracranial soft
tissues.

Sinuses/Orbits: No sinus fluid levels or advanced mucosal
thickening. No mastoid effusion. Normal orbits.
IMPRESSION: Normal head CT.

## 2019-10-30 ENCOUNTER — Encounter: Payer: Managed Care, Other (non HMO) | Admitting: Nurse Practitioner

## 2019-12-04 ENCOUNTER — Encounter: Payer: Self-pay | Admitting: Gastroenterology

## 2019-12-04 ENCOUNTER — Other Ambulatory Visit (INDEPENDENT_AMBULATORY_CARE_PROVIDER_SITE_OTHER): Payer: Managed Care, Other (non HMO)

## 2019-12-04 ENCOUNTER — Telehealth: Payer: Self-pay | Admitting: Gastroenterology

## 2019-12-04 ENCOUNTER — Ambulatory Visit (INDEPENDENT_AMBULATORY_CARE_PROVIDER_SITE_OTHER): Payer: Managed Care, Other (non HMO) | Admitting: Gastroenterology

## 2019-12-04 VITALS — BP 136/90 | HR 78 | Ht 69.0 in | Wt 395.0 lb

## 2019-12-04 DIAGNOSIS — R140 Abdominal distension (gaseous): Secondary | ICD-10-CM | POA: Diagnosis not present

## 2019-12-04 DIAGNOSIS — K581 Irritable bowel syndrome with constipation: Secondary | ICD-10-CM

## 2019-12-04 DIAGNOSIS — R194 Change in bowel habit: Secondary | ICD-10-CM | POA: Diagnosis not present

## 2019-12-04 DIAGNOSIS — K5909 Other constipation: Secondary | ICD-10-CM

## 2019-12-04 DIAGNOSIS — R195 Other fecal abnormalities: Secondary | ICD-10-CM | POA: Diagnosis not present

## 2019-12-04 DIAGNOSIS — R630 Anorexia: Secondary | ICD-10-CM

## 2019-12-04 DIAGNOSIS — R109 Unspecified abdominal pain: Secondary | ICD-10-CM

## 2019-12-04 DIAGNOSIS — K219 Gastro-esophageal reflux disease without esophagitis: Secondary | ICD-10-CM

## 2019-12-04 DIAGNOSIS — K625 Hemorrhage of anus and rectum: Secondary | ICD-10-CM

## 2019-12-04 DIAGNOSIS — R1084 Generalized abdominal pain: Secondary | ICD-10-CM

## 2019-12-04 DIAGNOSIS — Z800 Family history of malignant neoplasm of digestive organs: Secondary | ICD-10-CM

## 2019-12-04 LAB — BASIC METABOLIC PANEL
BUN: 35 mg/dL — ABNORMAL HIGH (ref 6–23)
CO2: 31 mEq/L (ref 19–32)
Calcium: 8.9 mg/dL (ref 8.4–10.5)
Chloride: 99 mEq/L (ref 96–112)
Creatinine, Ser: 4.96 mg/dL (ref 0.40–1.50)
GFR: 13.65 mL/min — CL (ref >60.00)
Glucose, Bld: 94 mg/dL (ref 70–99)
Potassium: 3.4 mEq/L — ABNORMAL LOW (ref 3.5–5.1)
Sodium: 138 mEq/L (ref 135–145)

## 2019-12-04 LAB — TSH: TSH: 3.34 u[IU]/mL (ref 0.35–4.50)

## 2019-12-04 LAB — CBC
HCT: 38.2 % — ABNORMAL LOW (ref 39.0–52.0)
Hemoglobin: 11.8 g/dL — ABNORMAL LOW (ref 13.0–17.0)
MCHC: 30.9 g/dL (ref 30.0–36.0)
MCV: 71.8 fl — ABNORMAL LOW (ref 78.0–100.0)
Platelets: 278 10*3/uL (ref 150.0–400.0)
RBC: 5.32 Mil/uL (ref 4.22–5.81)
RDW: 20.4 % — ABNORMAL HIGH (ref 11.5–15.5)
WBC: 9 10*3/uL (ref 4.0–10.5)

## 2019-12-04 LAB — HIGH SENSITIVITY CRP: CRP, High Sensitivity: 15.12 mg/L — ABNORMAL HIGH (ref 0.000–5.000)

## 2019-12-04 LAB — SEDIMENTATION RATE: Sed Rate: 70 mm/hr — ABNORMAL HIGH (ref 0–15)

## 2019-12-04 NOTE — Patient Instructions (Addendum)
Your provider has requested that you go to the basement level for lab work before leaving today. Press "B" on the elevator. The lab is located at the first door on the left as you exit the elevator.  Due to recent changes in healthcare laws, you may see the results of your imaging and laboratory studies on MyChart before your provider has had a chance to review them.  We understand that in some cases there may be results that are confusing or concerning to you. Not all laboratory results come back in the same time frame and the provider may be waiting for multiple results in order to interpret others.  Please give [place] 48 hours in order for your provider to thoroughly review all the results before contacting the office for clarification of your results.    Toileting tips to help with your constipation - Drink at least 64-80 ounces of water/liquid per day. - Establish a time to try to move your bowels every day.  For many people, this is after a cup of coffee or after a meal such as breakfast. - Sit all of the way back on the toilet keeping your back fairly straight and while sitting up, try to rest the tops of your forearms on your upper thighs.   - Raising your feet with a step stool/squatty potty can be helpful to improve the angle that allows your stool to pass through the rectum. - Relax the rectum feeling it bulge toward the toilet water.  If you feel your rectum raising toward your body, you are contracting rather than relaxing. - Breathe in and slowly exhale. "Belly breath" by expanding your belly towards your belly button. Keep belly expanded as you gently direct pressure down and back to the anus.  A low pitched GRRR sound can assist with increasing intra-abdominal pressure.  - Repeat 3-4 times. If unsuccessful, contract the pelvic floor to restore normal tone and get off the toilet.  Avoid excessive straining. - To reduce excessive wiping by teaching your anus to normally contract, place hands  on outer aspect of knees and resist knee movement outward.  Hold 5-10 second then place hands just inside of knees and resist inward movement of knees.  Hold 5 seconds.  Repeat a few times each way.  Start Taking: Linzess 170mcg -once daily- coupon card given.   We have sent the following medications to your pharmacy for you to pick up at your convenience: Nina have been scheduled for an endoscopy and colonoscopy. Please follow the written instructions given to you at your visit today. Please pick up your prep supplies at the pharmacy within the next 1-3 days. If you use inhalers (even only as needed), please bring them with you on the day of your procedure.   Thank you for choosing me and [facility].  Dr. Rush Landmark

## 2019-12-04 NOTE — Telephone Encounter (Signed)
KLB, Thanks for taking this call.  Agree he is labs are near baseline and he is followed by nephrology. Helmut Muster, tomorrow (Friday) we chatted the patient and make sure that he is aware of his labs and if you could forward these to his nephrologist at [facility] that would be great. Thanks. GM

## 2019-12-04 NOTE — Progress Notes (Signed)
[place] VISIT   Primary Care Provider Nche, Charlene Brooke, NP [address] [place] [address] [phone]  Referring Provider Elby Beck, [place],  [place] [address] [phone]  Patient Profile: Frank Coffey is a 39 y.o. male with a pmh significant for CHF, morbid obesity renal insufficiency, migraines, hypertension, hyperlipidemia, OSA, ?IBS, family history gastric cancer (father).  The patient presents to the [facility] for an evaluation and management of problem(s) noted below:  Problem List 1. Change in bowel habits   2. Other constipation   3. Irritable bowel syndrome with constipation   4. Bloating   5. Dark stools   6. Rectal bleeding   7. Generalized abdominal pain   8. Gastroesophageal reflux disease, unspecified whether esophagitis present   9. Decreased appetite   10. Family history of gastric cancer     History of Present Illness This is the patient's first visit to the [facility] GI clinic.  He has never had a gastroenterologist.  The patient has had extensive changes in his bowel habits since the Covid infection in April of this year.  The patient previously had bowel movements on a daily basis.  Over the course of this year he has developed issues with constipation and inability to express his bowels.  He has used a over-the-counter colon cleanse in effort of trying to help his symptoms.  He was initially then transition to Linzess 145 mcg but the cost is significant and he is not sure he can continue doing this.  The use of this will help him have a bowel movement every other day but he has significant discomfort when he has not had a bowel movement.  He has significant bloating as well as belching.  He has abdominal pain generalized throughout his abdomen.  He is noted black and tarry stools at times over the course of the last few months.  He has had episodes of rectal  bleeding (hematochezia) even up to last week which he has felt to be new to hemorrhoidal disease as he has had hemorrhoids in the past.  He has discomfort in his anal canal and rectum that occurs at times around his bowel movement.  Once he has a bowel movement he does feel improved in his discomfort.  He does take nonsteroidals which can help with discomfort and pain but he has renal dysfunction so is not clear that he should be taking those nonsteroidals.  He lost weight during his hospitalization for Covid months ago.  However his weight has been slowly climbing even though he has no appetite.  There is a family history of gastric cancer in his father.  The patient has never had an upper or lower endoscopy.  GI Review of Systems Positive as above including pyrosis (mostly dietary controlled) Negative for dysphagia, odynophagia, nausea, vomiting  Review of Systems General: Denies fevers/chills HEENT: Denies oral lesions/sore throat Cardiovascular: Denies chest pain/palpitations Pulmonary: Denies shortness of breath Gastroenterological: See HPI Genitourinary: Denies darkened urine Hematological: Denies easy bruising/bleeding Endocrine: Denies temperature intolerance Dermatological: Denies jaundice Psychological: Mood is stable   Medications Current Outpatient Medications  Medication Sig Dispense Refill  . allopurinol (ZYLOPRIM) 300 MG tablet TAKE 1 TABLET(300 MG) BY MOUTH DAILY 90 tablet 0  . aspirin 81 MG tablet Take 1 tablet (81 mg total) by mouth daily.    [name] atorvastatin (LIPITOR) 20 MG tablet Take 1 tablet (20 mg total) by mouth daily. 90 tablet 3  .  brimonidine (ALPHAGAN) 0.2 % ophthalmic solution Place 1 drop into the left eye 2 (two) times daily.     . carvedilol (COREG) 25 MG tablet Take 25 mg by mouth 2 (two) times daily with a meal.     . dorzolamide-timolol (COSOPT) 22.3-6.8 MG/ML ophthalmic solution Place 1 drop into the left eye 2 (two) times daily.     . DUREZOL 0.05 % EMUL  Place 1 drop into the left eye 4 (four) times daily.    [name] FIBER SUPPLEMENT PO Take 2 tablets by mouth daily.     . hydrALAZINE (APRESOLINE) 100 MG tablet Take 1 tablet (100 mg total) by mouth 3 (three) times daily. 90 tablet 0  . latanoprost (XALATAN) 0.005 % ophthalmic solution Place 1 drop into the left eye at bedtime.    [name] linaclotide (LINZESS) 145 MCG CAPS capsule Take 1 capsule (145 mcg total) by mouth daily before breakfast. 30 capsule 5  . NIFEdipine (ADALAT CC) 60 MG 24 hr tablet Take 1 tablet (60 mg total) by mouth at bedtime. 30 tablet 0  . potassium chloride SA (K-DUR) 20 MEQ tablet Take 1 tablet (20 mEq total) by mouth 2 (two) times daily. 10 tablet 0  . spironolactone (ALDACTONE) 25 MG tablet Take 25 mg by mouth 2 (two) times daily.    [name] torsemide (DEMADEX) 20 MG tablet TAKE 2 TABLETS(40 MG) BY MOUTH TWICE DAILY 120 tablet 1   No current facility-administered medications for this visit.    Allergies No Known Allergies  Histories Past Medical History:  Diagnosis Date  . CHF (congestive heart failure) ([facility] [place])   . Chronic kidney disease, stage 3, mod decreased GFR ([facility])   . Gout   . 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 Hx of migraine headaches   . Hyperlipidemia   . Hypertension   . Hypertensive heart disease with congestive heart failure and stage 3 kidney disease ([place])   . Obesity   . OSA (obstructive sleep apnea)    Past Surgical History:  Procedure Laterality Date  . CHOLECYSTECTOMY    . RETINAL DETACHMENT REPAIR W/ SCLERAL BUCKLE LE     Left   Social History   Socioeconomic History  . Marital status: Divorced    Spouse name: Not on file  . Number of children: Not on file  . Years of education: Not on file  . Highest education level: Not on file  Occupational History  . Not on file  Tobacco Use  . Smoking status: Never Smoker  . Smokeless tobacco: Never Used  Vaping Use  . Vaping Use: Never used  Substance and Sexual Activity  . Alcohol use: Yes    Comment:  occ  . Drug use: No  . Sexual activity: Not on file  Other Topics Concern  . Not on file  Social History Narrative   Epworth Sleepiness Scale = 10 (as of 12/01/14)   Social Determinants of Health   Financial Resource Strain:   . Difficulty of Paying Living Expenses: Not on file  Food Insecurity:   . Worried About Charity fundraiser in the Last Year: Not on file  . Ran Out of Food in the Last Year: Not on file  Transportation Needs:   . Lack of Transportation (Medical): Not on file  . Lack of Transportation (Non-Medical): Not on file  Physical Activity:   . Days of Exercise per Week: Not on file  . Minutes of Exercise per Session: Not on file  Stress:   .  Feeling of Stress : Not on file  Social Connections:   . Frequency of Communication with Friends and Family: Not on file  . Frequency of Social Gatherings with Friends and Family: Not on file  . Attends Religious Services: Not on file  . Active Member of Clubs or Organizations: Not on file  . Attends Archivist Meetings: Not on file  . Marital Status: Not on file  Intimate Partner Violence:   . Fear of Current or Ex-Partner: Not on file  . Emotionally Abused: Not on file  . Physically Abused: Not on file  . Sexually Abused: Not on file   Family History  Problem Relation Age of Onset  . Hypertension Mother   . Hyperlipidemia Mother   . Heart disease Mother   . Kidney disease Mother   . Hypertension Maternal Grandmother   . Stroke Maternal Grandmother   . Heart disease Maternal Grandmother   . Hyperlipidemia Maternal Grandmother   . Stroke Maternal Grandfather   . Hypertension Maternal Grandfather   . Heart disease Maternal Grandfather   . Hyperlipidemia Maternal Grandfather   . Alzheimer's disease Maternal Grandfather   . Cancer - Other Paternal Grandmother   . Healthy Brother        x2  . Healthy Sister        x2  . Stomach cancer Father   . Healthy Son        x2  . Healthy Daughter        x1  .  Diabetes Neg Hx   . Heart attack Neg Hx   . Sudden death Neg Hx   . Colon cancer Neg Hx   . Esophageal cancer Neg Hx   . Pancreatic cancer Neg Hx    I have reviewed his medical, social, and family history in detail and updated the electronic medical record as necessary.    PHYSICAL EXAMINATION  BP 136/90 (BP Location: Left Arm, Patient Position: Sitting, Cuff Size: Large)   Pulse 78   Ht 5\' 9"  (1.753 m)   Wt (!) 395 lb (179.2 kg)   BMI 58.33 kg/m  Wt Readings from Last 3 Encounters:  12/04/19 (!) 395 lb (179.2 kg)  10/13/19 (!) 390 lb 6.4 oz (177.1 kg)  10/02/19 (!) 358 lb (162.4 kg)  GEN: NAD, appears stated age, doesn't appear chronically ill PSYCH: Cooperative, without pressured speech EYE: Conjunctivae pink, sclerae anicteric ENT: MMM, without oral ulcers, no erythema or exudates noted NECK: Supple, enlarged neck girth CV: RR without R/Gs  RESP: Decreased breath sounds at the bases bilaterally otherwise no audible wheezing GI: NABS, obese, rounded, protuberant, nontender, without rebound or guarding, unable to appreciate hepatosplenomegaly as a result of body habitus MSK/EXT: No lower extremity edema SKIN: No jaundice NEURO:  Alert & Oriented x 3, no focal deficits   REVIEW OF DATA  I reviewed the following data at the time of this encounter:  GI Procedures and Studies  No relevant studies to review  Laboratory Studies  Reviewed those in epic  Imaging Studies  No relevant studies to review   ASSESSMENT  Mr. Markson is a 39 y.o. male with a pmh significant for CHF, morbid obesity renal insufficiency, migraines, hypertension, hyperlipidemia, OSA, ?IBS, family history gastric cancer (father).  The patient is seen today for evaluation and management of:  1. Change in bowel habits   2. Other constipation   3. Irritable bowel syndrome with constipation   4. Bloating   5. Dark stools  6. Rectal bleeding   7. Generalized abdominal pain   8. Gastroesophageal reflux  disease, unspecified whether esophagitis present   9. Decreased appetite   10. Family history of gastric cancer    The patient is hemodynamically stable.  Clinically however he has had significant changes in his bowel habits as well as concern for rectal bleeding and potentially upper GI bleeding in the setting of black and tarry stools that have occurred.  I suspect that this is a postinfectious IBS as a result of his significant Covid infection earlier this year in regards to the changes in his bowel habits but we cannot deny that there are potential red flag symptoms.  We need to further evaluate this.  We will obtain some laboratories to better define and ensure that there are no metabolic etiologies for his symptoms as well as obtain inflammatory markers to understand whether there could be underlying IBD or proctitis that could be causing some symptoms.  Endoscopic evaluation from above and below is recommended.  We will have the patient continue on his current dose of Linzess and try to offer him a coupon card to see if that may help with some of his costs and can certainly adjust his Linzess if we need to up/down or initiate him on other medications.  I recommended that the patient not take significant nonsteroidals in the setting of his underlying renal dysfunction.  We have gone over toileting techniques as well in effort of trying to help his issues of constipation.  Further work-up and evaluation for potential EPI and SIBO will be considered as well in the future.  The risks and benefits of endoscopic evaluation were discussed with the patient; these include but are not limited to the risk of perforation, infection, bleeding, missed lesions, lack of diagnosis, severe illness requiring hospitalization, as well as anesthesia and sedation related illnesses.  The patient is agreeable to proceed.  All patient questions were answered to the best of my ability, and the patient agrees to the aforementioned  plan of action with follow-up as indicated.   PLAN  Laboratories as outlined below Diagnostic endoscopy to be scheduled (esophageal/gastric/duodenal biopsies to be obtained) Diagnostic colonoscopy to be scheduled (IBD rule out and consider biopsies) Fecal elastase to be obtained rule out EPI Toileting techniques as per patient instructions   Orders Placed This Encounter  Procedures  . Procedural/ Surgical Case Request: COLONOSCOPY WITH PROPOFOL, ESOPHAGOGASTRODUODENOSCOPY (EGD) WITH PROPOFOL  . CBC  . Basic Metabolic Panel (BMET)  . Calcium, ionized  . TSH  . Sedimentation rate  . CRP High sensitivity  . Tissue transglutaminase, IgA  . Pancreatic elastase, fecal  . IgA    New Prescriptions   No medications on file   Modified Medications   No medications on file    Planned Follow Up No follow-ups on file.   Total Time in Face-to-Face and in Coordination of Care for patient including independent/personal interpretation/review of prior testing, medical history, examination, medication adjustment, communicating results with the patient directly, and documentation with the EHR is 45 minutes.   Justice Britain, MD [facility] Advanced Endoscopy Office # [phone]

## 2019-12-04 NOTE — Telephone Encounter (Signed)
After hours call by the lab with critical lab values:  BUN 35 Creatinine 4.96 K 3.4  Labs appears to be near baseline.  Will notify Dr. Rush Landmark, the ordering physician.

## 2019-12-05 LAB — IGA: Immunoglobulin A: 208 mg/dL (ref 47–310)

## 2019-12-05 LAB — CALCIUM, IONIZED: Calcium, Ion: 4.7 mg/dL — ABNORMAL LOW (ref 4.8–5.6)

## 2019-12-05 LAB — TISSUE TRANSGLUTAMINASE, IGA: (tTG) Ab, IgA: <1 U/mL

## 2019-12-05 NOTE — Telephone Encounter (Signed)
Faxed labs to [facility].

## 2019-12-09 ENCOUNTER — Encounter: Payer: Self-pay | Admitting: Gastroenterology

## 2019-12-09 DIAGNOSIS — Z800 Family history of malignant neoplasm of digestive organs: Secondary | ICD-10-CM | POA: Insufficient documentation

## 2019-12-09 DIAGNOSIS — R630 Anorexia: Secondary | ICD-10-CM | POA: Insufficient documentation

## 2019-12-09 DIAGNOSIS — R140 Abdominal distension (gaseous): Secondary | ICD-10-CM | POA: Insufficient documentation

## 2019-12-09 DIAGNOSIS — K625 Hemorrhage of anus and rectum: Secondary | ICD-10-CM | POA: Insufficient documentation

## 2019-12-09 DIAGNOSIS — R1084 Generalized abdominal pain: Secondary | ICD-10-CM | POA: Insufficient documentation

## 2019-12-09 DIAGNOSIS — R195 Other fecal abnormalities: Secondary | ICD-10-CM | POA: Insufficient documentation

## 2019-12-09 DIAGNOSIS — R194 Change in bowel habit: Secondary | ICD-10-CM | POA: Insufficient documentation

## 2019-12-09 DIAGNOSIS — K219 Gastro-esophageal reflux disease without esophagitis: Secondary | ICD-10-CM | POA: Insufficient documentation

## 2019-12-09 DIAGNOSIS — K581 Irritable bowel syndrome with constipation: Secondary | ICD-10-CM | POA: Insufficient documentation

## 2020-01-15 ENCOUNTER — Other Ambulatory Visit (HOSPITAL_COMMUNITY): Payer: Managed Care, Other (non HMO)

## 2020-01-15 ENCOUNTER — Encounter (HOSPITAL_COMMUNITY): Payer: Self-pay | Admitting: Gastroenterology

## 2020-01-15 ENCOUNTER — Other Ambulatory Visit: Payer: Self-pay

## 2020-01-15 NOTE — Progress Notes (Signed)
Mr. Kollmann denies chest pain or shortness of breath. Mr. Dauria denies s/s of Covid in his household and has not been exposed to anyone with Covid to his knowledge, patient will be tested 01/16/20.  Mr. Coburn states that he doesn't see a cardiologist any longer- 'that was when I was sick, I had all that extra fluid on me."  Mr. Joss states that he only gets short of breath when he runs.  Patient states that he is working out 5 days a week for 30 minutes, started recently, was very weak after having Covid this year, I am feeling much stronger now."

## 2020-01-16 ENCOUNTER — Other Ambulatory Visit (HOSPITAL_COMMUNITY)
Admission: RE | Admit: 2020-01-16 | Discharge: 2020-01-16 | Disposition: A | Discharge status: Home / Self Care | Payer: Managed Care, Other (non HMO) | Source: Ambulatory Visit | Attending: Gastroenterology | Admitting: Gastroenterology

## 2020-01-16 DIAGNOSIS — Z20822 Contact with and (suspected) exposure to covid-19: Secondary | ICD-10-CM | POA: Insufficient documentation

## 2020-01-16 DIAGNOSIS — Z01812 Encounter for preprocedural laboratory examination: Secondary | ICD-10-CM | POA: Insufficient documentation

## 2020-01-16 LAB — SARS CORONAVIRUS 2 (TAT 6-24 HRS): SARS Coronavirus 2: NEGATIVE

## 2020-01-18 NOTE — Progress Notes (Signed)
Talked with patient regarding his procedure scheduled for 1/3. Patient states he has been able to quarantine over the weekend. He denies any symptoms or being around anyone who is sick. Patient understands instructions for tomorrow.

## 2020-01-19 ENCOUNTER — Telehealth: Payer: Self-pay | Admitting: Gastroenterology

## 2020-01-19 ENCOUNTER — Ambulatory Visit (HOSPITAL_COMMUNITY)
Admission: RE | Admit: 2020-01-19 | Payer: Managed Care, Other (non HMO) | Source: Home / Self Care | Admitting: Gastroenterology

## 2020-01-19 HISTORY — DX: Depression, unspecified: F32.A

## 2020-01-19 HISTORY — DX: Other constipation: K59.09

## 2020-01-19 HISTORY — DX: Personal history of urinary calculi: Z87.442

## 2020-01-19 SURGERY — COLONOSCOPY WITH PROPOFOL
Anesthesia: Monitor Anesthesia Care

## 2020-01-19 MED ORDER — SUPREP BOWEL PREP KIT 17.5-3.13-1.6 GM/177ML PO SOLN: 1.0000 | ORAL | 0 refills | Status: DC

## 2020-01-19 NOTE — Telephone Encounter (Addendum)
Dr. Rush Landmark - I have called and spoke with the patient. He seems to be given conflicting information. He informed me that prescription was sent into [facility],but could not be filled. I have called [facility] to verify that prescription was there and it was filled per the pharmacist. I also asked pt about calling the office to inform us of this and he tells me that he called office on Friday only. Our office was closed on Friday, so he never actually spoke with anyone in our office.Just with the answering service.  I also asked patient why he waited so long to get prescription as he was scheduled in Nov in the office and instructed at office visit. In our patient instructions it tells the patient to pick up preparation within 1 week of procedure to avoid any delays or problems. Patient is also mychart active. Pt has been r/s to 02/09/20. New instructions will be sent to pt via mychart and I will  mail copy as well. Pharmacy has filled prep and pt has been informed.

## 2020-01-19 NOTE — Addendum Note (Signed)
Addended by: Debbe Mounts on: 01/19/2020 10:29 AM   Modules accepted: Orders

## 2020-01-19 NOTE — Telephone Encounter (Signed)
FYI  I called in prescription of GoLYTELY to [facility] pharmacy in [facility] yesterday at 4:30 PM I personally talked to the pharmacist and gave instructions I informed the patient over the phone that he can go pick it up  RG

## 2020-01-19 NOTE — Telephone Encounter (Signed)
The patient called this morning stating he never received this preparation. I spoke with him for about 10 minutes this morning. He describes that last week on Wednesday and Thursday he reached out to the pharmacy and was told that no prescription for his preparation had been sent.  Subsequently he called and said that he spoke with the office and that he would get sent and I do not see documentation of that as of yet. Over the weekend he said he called a few more times to the number that was provided and he was able to reach the answering service as well as on-call team and he states that he spoke with the on-call team on at least one if not 2 occasions. He was told that preparation would be sent to a pharmacy and then it would be sent to a 24-hour pharmacy however the patient called multiple times and had not received any preparation at any pharmacy. Patient frustrated this morning because of need for procedures to be completed as well as having completed his Covid testing. I apologized for [place] not having the preparation at the pharmacy and we will work to rectify and see what has happened in regards to why that this fell through the cracks. Please work on rescheduling this on 24th or 26th of January (1 hour total case procedure). Please ensure that the preparation instructions and preparation are at pharmacy.  Thanks. GM

## 2020-01-19 NOTE — Telephone Encounter (Signed)
Thanks to everyone. I suspected that everything had been done as it always has. The holiday weekend may have made things not as ideal from a communication perspective. Frank Coffey, I was able to reach him at the following number - [phone]. Thanks. GM

## 2020-01-21 NOTE — Telephone Encounter (Signed)
I didn't receive any calls from patient or a pharmacy about this

## 2020-01-22 ENCOUNTER — Other Ambulatory Visit: Payer: Self-pay

## 2020-01-23 ENCOUNTER — Other Ambulatory Visit: Payer: Self-pay

## 2020-01-23 ENCOUNTER — Ambulatory Visit (INDEPENDENT_AMBULATORY_CARE_PROVIDER_SITE_OTHER): Payer: 59 | Admitting: Nurse Practitioner

## 2020-01-23 ENCOUNTER — Encounter: Payer: Self-pay | Admitting: Nurse Practitioner

## 2020-01-23 VITALS — BP 124/80 | HR 88 | Temp 97.0°F | Ht 69.0 in | Wt 388.0 lb

## 2020-01-23 DIAGNOSIS — N184 Chronic kidney disease, stage 4 (severe): Secondary | ICD-10-CM | POA: Diagnosis not present

## 2020-01-23 DIAGNOSIS — G4733 Obstructive sleep apnea (adult) (pediatric): Secondary | ICD-10-CM

## 2020-01-23 DIAGNOSIS — I5022 Chronic systolic (congestive) heart failure: Secondary | ICD-10-CM | POA: Diagnosis not present

## 2020-01-23 DIAGNOSIS — I10 Essential (primary) hypertension: Secondary | ICD-10-CM

## 2020-01-23 DIAGNOSIS — I129 Hypertensive chronic kidney disease with stage 1 through stage 4 chronic kidney disease, or unspecified chronic kidney disease: Secondary | ICD-10-CM

## 2020-01-23 DIAGNOSIS — K148 Other diseases of tongue: Secondary | ICD-10-CM

## 2020-01-23 MED ORDER — CARVEDILOL 25 MG PO TABS: 25.0000 mg | ORAL_TABLET | Freq: Two times a day (BID) | ORAL | 3 refills | Status: DC

## 2020-01-23 MED ORDER — NIFEDIPINE ER 60 MG PO TB24: 60.0000 mg | ORAL_TABLET | Freq: Every day | ORAL | 3 refills | Status: DC

## 2020-01-23 MED ORDER — HYDRALAZINE HCL 100 MG PO TABS: 100.0000 mg | ORAL_TABLET | Freq: Three times a day (TID) | ORAL | 3 refills | Status: DC

## 2020-01-23 NOTE — Progress Notes (Signed)
Subjective:  Patient ID: Frank Coffey, male    DOB: 26-Aug-1980  Age: 40 y.o. MRN: [id number]  CC: Acute Visit (Pt c/o swelling in legs and feet x1.5 weeks. Pt also in need of medication refills (torsemide, potassium, and spironolactone))  HPI Left side tongue lesion: present for several weeks, associated with pain. Unknown cause. no tobacco use.  Benign hypertension with chronic kidney disease, stage IV (HCC) Has not maintained appt with nephrology and cardiology. BP at goal today Advised about the importance of medication and f/up appt compliance. BP Readings from Last 3 Encounters:  01/23/20 124/80  12/04/19 136/90  10/13/19 138/74   Repeat BMP Continue coreg, hydralazine and nifedipine Hold spironolactone and torsemide while waiting for lab results. Entered cardiology referral  CKD (chronic kidney disease) stage 4, GFR 15-29 ml/min ([name]) Repeat BMP He was informed about his app[facility] on 01/27/2020 at 8:45 am. Hold torsemide and spironolactone with pending BMP results  Wt Readings from Last 3 Encounters:  01/23/20 (!) 388 lb (176 kg)  12/04/19 (!) 395 lb (179.2 kg)  10/13/19 (!) 390 lb 6.4 oz (177.1 kg)   Reviewed past Medical, Social and Family history today.  Outpatient Medications Prior to Visit  Medication Sig Dispense Refill  . allopurinol (ZYLOPRIM) 300 MG tablet TAKE 1 TABLET(300 MG) BY MOUTH DAILY (Patient taking differently: Take 300 mg by mouth every evening.) 90 tablet 0  . atorvastatin (LIPITOR) 20 MG tablet Take 1 tablet (20 mg total) by mouth daily. 90 tablet 3  . brimonidine (ALPHAGAN) 0.2 % ophthalmic solution Place 1 drop into both eyes 2 (two) times daily.    . dorzolamide-timolol (COSOPT) 22.3-6.8 MG/ML ophthalmic solution Place 1 drop into both eyes 2 (two) times daily.    Frank Coffey FIBER SUPPLEMENT PO Take 2 tablets by mouth daily.     [name] latanoprost (XALATAN) 0.005 % ophthalmic solution Place 1 drop into the left eye at bedtime.     [name] linaclotide (LINZESS) 145 MCG CAPS capsule Take 1 capsule (145 mcg total) by mouth daily before breakfast. 30 capsule 5  . Na Sulfate-K Sulfate-Mg Sulf (SUPREP BOWEL PREP KIT) 17.5-3.13-1.6 GM/177ML SOLN Take 1 kit by mouth as directed. For colonoscopy prep 354 mL 0  . potassium chloride SA (K-DUR) 20 MEQ tablet Take 1 tablet (20 mEq total) by mouth 2 (two) times daily. 10 tablet 0  . spironolactone (ALDACTONE) 25 MG tablet Take 25 mg by mouth 2 (two) times daily.    [name] torsemide (DEMADEX) 20 MG tablet TAKE 2 TABLETS(40 MG) BY MOUTH TWICE DAILY (Patient taking differently: Take 40 mg by mouth 2 (two) times daily.) 120 tablet 1  . carvedilol (COREG) 25 MG tablet Take 25 mg by mouth 2 (two) times daily with a meal.     . hydrALAZINE (APRESOLINE) 100 MG tablet Take 1 tablet (100 mg total) by mouth 3 (three) times daily. 90 tablet 0  . NIFEdipine (ADALAT CC) 60 MG 24 hr tablet Take 1 tablet (60 mg total) by mouth at bedtime. 30 tablet 0   No facility-administered medications prior to visit.    ROS See HPI  Objective:  BP 124/80 (BP Location: Left Arm, Patient Position: Sitting, Cuff Size: Large)   Pulse 88   Temp (!) 97 F (36.1 C) (Temporal)   Ht $R'5\' 9"'tI$  (1.753 m)   Wt (!) 388 lb (176 kg)   SpO2 97%   BMI 57.30 kg/m   Physical Exam HENT:  Mouth/Throat:     Mouth: Mucous membranes are dry.     Tongue: Lesions present. Tongue does not deviate from midline.     Palate: No mass and lesions.     Pharynx: Uvula midline.   Cardiovascular:     Rate and Rhythm: Normal rate.     Pulses: Normal pulses.  Pulmonary:     Effort: Pulmonary effort is normal.  Musculoskeletal:     Right lower leg: Edema present.     Left lower leg: Edema present.  Neurological:     Mental Status: He is oriented to person, place, and time.    Assessment & Plan:  This visit occurred during the SARS-CoV-2 public health emergency.  Safety protocols were in place, including screening questions prior to  the visit, additional usage of staff PPE, and extensive cleaning of exam room while observing appropriate contact time as indicated for disinfecting solutions.   Frank Coffey was seen today for acute visit.  Diagnoses and all orders for this visit:  Chronic systolic CHF (congestive heart failure) ([name]) -     Ambulatory referral to Cardiology -     Cancel: Basic metabolic panel -     Basic metabolic panel; Future -     carvedilol (COREG) 25 MG tablet; Take 1 tablet (25 mg total) by mouth 2 (two) times daily with a meal.  Benign hypertension with chronic kidney disease, stage IV ([name]) -     Ambulatory referral to Cardiology -     Cancel: Basic metabolic panel -     Basic metabolic panel; Future -     carvedilol (COREG) 25 MG tablet; Take 1 tablet (25 mg total) by mouth 2 (two) times daily with a meal. -     hydrALAZINE (APRESOLINE) 100 MG tablet; Take 1 tablet (100 mg total) by mouth 3 (three) times daily. -     NIFEdipine (ADALAT CC) 60 MG 24 hr tablet; Take 1 tablet (60 mg total) by mouth at bedtime.  OSA (obstructive sleep apnea) -     Ambulatory referral to Cardiology  CKD (chronic kidney disease) stage 4, GFR 15-29 ml/min (HCC) -     Cancel: Basic metabolic panel -     Cancel: Phosphorus -     Phosphorus; Future -     Basic metabolic panel; Future  Tongue lesion -     Ambulatory referral to ENT  Essential hypertension, benign    Problem List Items Addressed This Visit      Cardiovascular and Mediastinum   Benign hypertension with chronic kidney disease, stage IV ([name])    Has not maintained appt with nephrology and cardiology. BP at goal today Advised about the importance of medication and f/up appt compliance. BP Readings from Last 3 Encounters:  01/23/20 124/80  12/04/19 136/90  10/13/19 138/74   Repeat BMP Continue coreg, hydralazine and nifedipine Hold spironolactone and torsemide while waiting for lab results. Entered cardiology referral      Relevant  Medications   carvedilol (COREG) 25 MG tablet   hydrALAZINE (APRESOLINE) 100 MG tablet   NIFEdipine (ADALAT CC) 60 MG 24 hr tablet   Other Relevant Orders   Ambulatory referral to Cardiology   Basic metabolic panel   Chronic systolic CHF (congestive heart failure) (HCC) - Primary   Relevant Medications   carvedilol (COREG) 25 MG tablet   hydrALAZINE (APRESOLINE) 100 MG tablet   NIFEdipine (ADALAT CC) 60 MG 24 hr tablet   Other Relevant Orders   Ambulatory referral to Cardiology  Basic metabolic panel     Respiratory   OSA (obstructive sleep apnea) (Chronic)   Relevant Orders   Ambulatory referral to Cardiology     Genitourinary   CKD (chronic kidney disease) stage 4, GFR 15-29 ml/min (HCC) (Chronic)    Repeat BMP He was informed about his appt with [facility] on 01/27/2020 at 8:45 am. Hold torsemide and spironolactone with pending BMP results      Relevant Orders   Phosphorus   Basic metabolic panel    Other Visit Diagnoses    Tongue lesion       Relevant Orders   Ambulatory referral to ENT   Essential hypertension, benign       Relevant Medications   carvedilol (COREG) 25 MG tablet   hydrALAZINE (APRESOLINE) 100 MG tablet   NIFEdipine (ADALAT CC) 60 MG 24 hr tablet      Follow-up: Return in about 4 weeks (around 02/20/2020) for CPE (fasting).  [name], NP

## 2020-01-23 NOTE — Assessment & Plan Note (Signed)
Repeat BMP He was informed about his appt with [facility] on 01/27/2020 at 8:45 am. Hold torsemide and spironolactone with pending BMP results

## 2020-01-23 NOTE — Assessment & Plan Note (Signed)
Has not maintained appt with nephrology and cardiology. BP at goal today Advised about the importance of medication and f/up appt compliance. BP Readings from Last 3 Encounters:  01/23/20 124/80  12/04/19 136/90  10/13/19 138/74   Repeat BMP Continue coreg, hydralazine and nifedipine Hold spironolactone and torsemide while waiting for lab results. Entered cardiology referral

## 2020-01-23 NOTE — Assessment & Plan Note (Signed)
>>  ASSESSMENT AND PLAN FOR BENIGN HYPERTENSION WITH CHRONIC KIDNEY DISEASE, STAGE IV (HCC) WRITTEN ON 01/23/2020 12:55 PM BY Yamna Mackel LUM, NP  Has not maintained appt with nephrology and cardiology. BP at goal today Advised about the importance of medication and f/up appt compliance. BP Readings from Last 3 Encounters:  01/23/20 124/80  12/04/19 136/90  10/13/19 138/74   Repeat BMP Continue coreg, hydralazine and nifedipine Hold spironolactone and torsemide while waiting for lab results. Entered cardiology referral

## 2020-01-23 NOTE — Patient Instructions (Signed)
You have an appointment with [facility] on 01/27/2020 at 8:45am. Due to multiple no show appointments, you will be dismissed from their practice if you do not make this appointment.  You will be contacted to schedule an appointment with cardiology.  Schedule f/up appt with [facility] for CPAP management: [phone].  Go to lab for blood draw. Will send medication refill if renal function is stable.

## 2020-01-26 ENCOUNTER — Other Ambulatory Visit (INDEPENDENT_AMBULATORY_CARE_PROVIDER_SITE_OTHER): Payer: 59

## 2020-01-26 ENCOUNTER — Other Ambulatory Visit: Payer: 59

## 2020-01-26 DIAGNOSIS — N184 Chronic kidney disease, stage 4 (severe): Secondary | ICD-10-CM | POA: Diagnosis not present

## 2020-01-26 DIAGNOSIS — I5022 Chronic systolic (congestive) heart failure: Secondary | ICD-10-CM

## 2020-01-26 DIAGNOSIS — I129 Hypertensive chronic kidney disease with stage 1 through stage 4 chronic kidney disease, or unspecified chronic kidney disease: Secondary | ICD-10-CM | POA: Diagnosis not present

## 2020-01-26 LAB — BASIC METABOLIC PANEL
BUN: 35 mg/dL — ABNORMAL HIGH (ref 6–23)
CO2: 29 mEq/L (ref 19–32)
Calcium: 9 mg/dL (ref 8.4–10.5)
Chloride: 101 mEq/L (ref 96–112)
Creatinine, Ser: 4.58 mg/dL (ref 0.40–1.50)
GFR: 15 mL/min — CL (ref >60.00)
Glucose, Bld: 89 mg/dL (ref 70–99)
Potassium: 3.6 mEq/L (ref 3.5–5.1)
Sodium: 136 mEq/L (ref 135–145)

## 2020-01-26 LAB — PHOSPHORUS: Phosphorus: 3.6 mg/dL (ref 2.3–4.6)

## 2020-01-27 MED ORDER — TORSEMIDE 20 MG PO TABS: 20.0000 mg | ORAL_TABLET | Freq: Two times a day (BID) | ORAL | 0 refills | Status: DC

## 2020-01-27 NOTE — Addendum Note (Signed)
Addended by: Leana Gamer on: 01/27/2020 08:32 AM   Modules accepted: Orders

## 2020-02-05 ENCOUNTER — Other Ambulatory Visit: Payer: Self-pay

## 2020-02-05 ENCOUNTER — Other Ambulatory Visit (HOSPITAL_COMMUNITY)
Admission: RE | Admit: 2020-02-05 | Discharge: 2020-02-05 | Disposition: A | Discharge status: Home / Self Care | Payer: 59 | Source: Ambulatory Visit | Attending: Gastroenterology | Admitting: Gastroenterology

## 2020-02-05 ENCOUNTER — Other Ambulatory Visit (HOSPITAL_COMMUNITY): Payer: Self-pay | Admitting: *Deleted

## 2020-02-05 ENCOUNTER — Encounter (HOSPITAL_COMMUNITY): Payer: Self-pay | Admitting: Gastroenterology

## 2020-02-05 DIAGNOSIS — Z20822 Contact with and (suspected) exposure to covid-19: Secondary | ICD-10-CM | POA: Diagnosis not present

## 2020-02-05 DIAGNOSIS — Z01812 Encounter for preprocedural laboratory examination: Secondary | ICD-10-CM | POA: Diagnosis present

## 2020-02-05 NOTE — Progress Notes (Signed)
Mr. Frank Coffey deneis chest pain or shortness of breath.  Patient denies any s/s of Covid for him or anyone in his household.  [name] was tested for Covid today and is aware that he is quarantine until after procedure.

## 2020-02-06 LAB — SARS CORONAVIRUS 2 (TAT 6-24 HRS): SARS Coronavirus 2: NEGATIVE

## 2020-02-09 ENCOUNTER — Other Ambulatory Visit: Payer: Self-pay

## 2020-02-09 ENCOUNTER — Encounter (HOSPITAL_COMMUNITY)
Admission: RE | Disposition: A | Discharge status: Home / Self Care | Payer: Self-pay | Source: Home / Self Care | Attending: Gastroenterology

## 2020-02-09 ENCOUNTER — Ambulatory Visit (HOSPITAL_COMMUNITY): Payer: 59 | Admitting: Certified Registered"

## 2020-02-09 ENCOUNTER — Ambulatory Visit (HOSPITAL_COMMUNITY)
Admission: RE | Admit: 2020-02-09 | Discharge: 2020-02-09 | Disposition: A | Discharge status: Home / Self Care | Payer: 59 | Attending: Gastroenterology | Admitting: Gastroenterology

## 2020-02-09 ENCOUNTER — Encounter (HOSPITAL_COMMUNITY): Payer: Self-pay | Admitting: Gastroenterology

## 2020-02-09 DIAGNOSIS — K2950 Unspecified chronic gastritis without bleeding: Secondary | ICD-10-CM | POA: Diagnosis not present

## 2020-02-09 DIAGNOSIS — D128 Benign neoplasm of rectum: Secondary | ICD-10-CM | POA: Insufficient documentation

## 2020-02-09 DIAGNOSIS — Z800 Family history of malignant neoplasm of digestive organs: Secondary | ICD-10-CM | POA: Insufficient documentation

## 2020-02-09 DIAGNOSIS — R195 Other fecal abnormalities: Secondary | ICD-10-CM | POA: Insufficient documentation

## 2020-02-09 DIAGNOSIS — K621 Rectal polyp: Secondary | ICD-10-CM

## 2020-02-09 DIAGNOSIS — R194 Change in bowel habit: Secondary | ICD-10-CM

## 2020-02-09 DIAGNOSIS — K6389 Other specified diseases of intestine: Secondary | ICD-10-CM | POA: Insufficient documentation

## 2020-02-09 DIAGNOSIS — Z79899 Other long term (current) drug therapy: Secondary | ICD-10-CM | POA: Insufficient documentation

## 2020-02-09 DIAGNOSIS — R12 Heartburn: Secondary | ICD-10-CM | POA: Diagnosis not present

## 2020-02-09 DIAGNOSIS — R140 Abdominal distension (gaseous): Secondary | ICD-10-CM | POA: Insufficient documentation

## 2020-02-09 DIAGNOSIS — K2100 Gastro-esophageal reflux disease with esophagitis, without bleeding: Secondary | ICD-10-CM | POA: Insufficient documentation

## 2020-02-09 DIAGNOSIS — K644 Residual hemorrhoidal skin tags: Secondary | ICD-10-CM | POA: Insufficient documentation

## 2020-02-09 DIAGNOSIS — K641 Second degree hemorrhoids: Secondary | ICD-10-CM | POA: Insufficient documentation

## 2020-02-09 DIAGNOSIS — K219 Gastro-esophageal reflux disease without esophagitis: Secondary | ICD-10-CM | POA: Diagnosis not present

## 2020-02-09 DIAGNOSIS — K2970 Gastritis, unspecified, without bleeding: Secondary | ICD-10-CM

## 2020-02-09 DIAGNOSIS — Z8673 Personal history of transient ischemic attack (TIA), and cerebral infarction without residual deficits: Secondary | ICD-10-CM | POA: Diagnosis not present

## 2020-02-09 DIAGNOSIS — R1084 Generalized abdominal pain: Secondary | ICD-10-CM | POA: Insufficient documentation

## 2020-02-09 DIAGNOSIS — K319 Disease of stomach and duodenum, unspecified: Secondary | ICD-10-CM | POA: Diagnosis not present

## 2020-02-09 HISTORY — PX: COLONOSCOPY WITH PROPOFOL: SHX5780

## 2020-02-09 HISTORY — PX: ESOPHAGOGASTRODUODENOSCOPY (EGD) WITH PROPOFOL: SHX5813

## 2020-02-09 HISTORY — PX: POLYPECTOMY: SHX5525

## 2020-02-09 HISTORY — PX: BIOPSY: SHX5522

## 2020-02-09 SURGERY — COLONOSCOPY WITH PROPOFOL
Anesthesia: General

## 2020-02-09 MED ORDER — ONDANSETRON HCL 4 MG/2ML IJ SOLN
INTRAMUSCULAR | Status: DC | PRN
  Administered 2020-02-09: 4 mg via INTRAVENOUS

## 2020-02-09 MED ORDER — SUGAMMADEX SODIUM 200 MG/2ML IV SOLN
INTRAVENOUS | Status: DC | PRN
  Administered 2020-02-09: 400 mg via INTRAVENOUS

## 2020-02-09 MED ORDER — PROPOFOL 10 MG/ML IV BOLUS
INTRAVENOUS | Status: DC | PRN
  Administered 2020-02-09: 70 mg via INTRAVENOUS

## 2020-02-09 MED ORDER — ROCURONIUM BROMIDE 100 MG/10ML IV SOLN
INTRAVENOUS | Status: DC | PRN
  Administered 2020-02-09: 50 mg via INTRAVENOUS

## 2020-02-09 MED ORDER — LABETALOL HCL 5 MG/ML IV SOLN
INTRAVENOUS | Status: AC
  Filled 2020-02-09: qty 4

## 2020-02-09 MED ORDER — DEXAMETHASONE SODIUM PHOSPHATE 10 MG/ML IJ SOLN
INTRAMUSCULAR | Status: DC | PRN
  Administered 2020-02-09: 5 mg via INTRAVENOUS

## 2020-02-09 MED ORDER — PHENYLEPHRINE HCL (PRESSORS) 10 MG/ML IV SOLN
INTRAVENOUS | Status: DC | PRN
  Administered 2020-02-09 (×5): 80 ug via INTRAVENOUS

## 2020-02-09 MED ORDER — LABETALOL HCL 5 MG/ML IV SOLN
10.0000 mg | Freq: Once | INTRAVENOUS | Status: AC
  Administered 2020-02-09: 10 mg via INTRAVENOUS

## 2020-02-09 MED ORDER — HYDRALAZINE HCL 20 MG/ML IJ SOLN
INTRAMUSCULAR | Status: AC
  Filled 2020-02-09: qty 1

## 2020-02-09 MED ORDER — SUCCINYLCHOLINE CHLORIDE 20 MG/ML IJ SOLN
INTRAMUSCULAR | Status: DC | PRN
  Administered 2020-02-09: 180 mg via INTRAVENOUS

## 2020-02-09 MED ORDER — PROPOFOL 500 MG/50ML IV EMUL
INTRAVENOUS | Status: DC | PRN
  Administered 2020-02-09: 150 ug/kg/min via INTRAVENOUS

## 2020-02-09 MED ORDER — OMEPRAZOLE 40 MG PO CPDR: 40.0000 mg | DELAYED_RELEASE_CAPSULE | Freq: Every day | ORAL | 6 refills | Status: DC

## 2020-02-09 MED ORDER — LACTATED RINGERS IV SOLN: INTRAVENOUS | Status: DC | PRN

## 2020-02-09 MED ORDER — HYDRALAZINE HCL 20 MG/ML IJ SOLN
10.0000 mg | Freq: Once | INTRAMUSCULAR | Status: AC
  Administered 2020-02-09: 10 mg via INTRAVENOUS

## 2020-02-09 SURGICAL SUPPLY — 25 items

## 2020-02-09 NOTE — Discharge Instructions (Signed)
YOU HAD AN ENDOSCOPIC PROCEDURE TODAY: Refer to the procedure report and other information in the discharge instructions given to you for any specific questions about what was found during the examination. If this information does not answer your questions, please call [place] office at [phone] to clarify.  ° °YOU SHOULD EXPECT: Some feelings of bloating in the abdomen. Passage of more gas than usual. Walking can help get rid of the air that was put into your GI tract during the procedure and reduce the bloating. If you had a lower endoscopy (such as a colonoscopy or flexible sigmoidoscopy) you may notice spotting of blood in your stool or on the toilet paper. Some abdominal soreness may be present for a day or two, also. ° °DIET: Your first meal following the procedure should be a light meal and then it is ok to progress to your normal diet. A half-sandwich or bowl of soup is an example of a good first meal. Heavy or fried foods are harder to digest and may make you feel nauseous or bloated. Drink plenty of fluids but you should avoid alcoholic beverages for 24 hours. If you had a esophageal dilation, please see attached instructions for diet.   ° °ACTIVITY: Your care partner should take you home directly after the procedure. You should plan to take it easy, moving slowly for the rest of the day. You can resume normal activity the day after the procedure however YOU SHOULD NOT DRIVE, use power tools, machinery or perform tasks that involve climbing or major physical exertion for 24 hours (because of the sedation medicines used during the test).  ° °SYMPTOMS TO REPORT IMMEDIATELY: °A gastroenterologist can be reached at any hour. Please call [phone]  for any of the following symptoms:  °Following lower endoscopy (colonoscopy, flexible sigmoidoscopy) °Excessive amounts of blood in the stool  °Significant tenderness, worsening of abdominal pains  °Swelling of the abdomen that is new, acute  °Fever of 100° or  higher  °Following upper endoscopy (EGD, EUS, ERCP, esophageal dilation) °Vomiting of blood or coffee ground material  °New, significant abdominal pain  °New, significant chest pain or pain under the shoulder blades  °Painful or persistently difficult swallowing  °New shortness of breath  °Black, tarry-looking or red, bloody stools ° °FOLLOW UP:  °If any biopsies were taken you will be contacted by phone or by letter within the next 1-3 weeks. Call [phone]  if you have not heard about the biopsies in 3 weeks.  °Please also call with any specific questions about appointments or follow up tests. ° °

## 2020-02-09 NOTE — Op Note (Signed)
[facility] Patient Name: Frank Coffey Procedure Date : 02/09/2020 MRN: [id number] Attending MD: Justice Britain , MD Date of Birth: 01-28-1980 CSN: [id number] Age: 40 Admit Type: Outpatient Procedure:                Colonoscopy Indications:              Change in bowel habits, Constipation, Bloating Providers:                Justice Britain, MD, Kary Kos RN, RN,                            Laverda Sorenson, Technician, Phill Myron. Proofreader,                            CRNA Referring MD:             Charlene Brooke Nche Medicines:                General Anesthesia Complications:            No immediate complications. Estimated Blood Loss:     Estimated blood loss was minimal. Procedure:                Pre-Anesthesia Assessment:                           - Prior to the procedure, a History and Physical                            was performed, and patient medications and                            allergies were reviewed. The patient's tolerance of                            previous anesthesia was also reviewed. The risks                            and benefits of the procedure and the sedation                            options and risks were discussed with the patient.                            All questions were answered, and informed consent                            was obtained. Prior Anticoagulants: The patient has                            taken no previous anticoagulant or antiplatelet                            agents. ASA Grade Assessment: III - A patient with  severe systemic disease. After reviewing the risks                            and benefits, the patient was deemed in                            satisfactory condition to undergo the procedure.                           After obtaining informed consent, the colonoscope                            was passed under direct vision. Throughout the                             procedure, the patient's blood pressure, pulse, and                            oxygen saturations were monitored continuously. The                            CF-HQ190L ([id number]) Olympus colonoscope was                            introduced through the anus and advanced to the 5                            cm into the ileum. The colonoscopy was performed                            without difficulty. The patient tolerated the                            procedure. The quality of the bowel preparation was                            adequate. The terminal ileum, ileocecal valve,                            appendiceal orifice, and rectum were photographed. Scope In: 1:47:09 PM Scope Out: 2:04:58 PM Scope Withdrawal Time: 0 hours 15 minutes 18 seconds  Total Procedure Duration: 0 hours 17 minutes 49 seconds  Findings:      The digital rectal exam findings include hemorrhoids. Pertinent       negatives include no palpable rectal lesions.      The terminal ileum and ileocecal valve appeared normal. Biopsies were       taken with a cold forceps for histology to rule out chronic ileitis.      A diffuse melanosis was found in the entire colon. Biopsies were taken       with a cold forceps for histology to rule out microscopic/collagenous       colitis.      A 4 mm polyp was found in the mid rectum. The polyp was sessile. The       polyp was  removed with a cold snare. Resection and retrieval were       complete.      Non-bleeding non-thrombosed external and internal hemorrhoids were found       during retroflexion, during perianal exam and during digital exam. The       hemorrhoids were Grade II (internal hemorrhoids that prolapse but reduce       spontaneously). Impression:               - Hemorrhoids found on digital rectal exam.                           - The examined portion of the ileum was normal.                           - Melanosis throughout the colon. Biopsied.                            - One 4 mm polyp in the mid rectum, removed with a                            cold snare. Resected and retrieved.                           - Non-bleeding non-thrombosed external and internal                            hemorrhoids. Recommendation:           - The patient will be observed post-procedure,                            until all discharge criteria are met.                           - Discharge patient to home.                           - Patient has a contact number available for                            emergencies. The signs and symptoms of potential                            delayed complications were discussed with the                            patient. Return to normal activities tomorrow.                            Written discharge instructions were provided to the                            patient.                           - High fiber diet.                           -  Use FiberCon 1-2 tablets PO daily.                           - Continue present medications.                           - Await pathology results.                           - Repeat colonoscopy in 05/22/08 years for                            surveillance based on pathology results and                            findings of adenomatous tissue.                           - The findings and recommendations were discussed                            with the patient.                           - The findings and recommendations were discussed                            with the designated responsible adult. Procedure Code(s):        --- Professional ---                           [phone], Colonoscopy, flexible; with removal of                            tumor(s), polyp(s), or other lesion(s) by snare                            technique                           45380, 78, Colonoscopy, flexible; with biopsy,                            single or multiple Diagnosis Code(s):        --- Professional ---                            K64.1, Second degree hemorrhoids                           K63.89, Other specified diseases of intestine                           K62.1, Rectal polyp                           R19.4, Change in bowel habit  K59.00, Constipation, unspecified CPT copyright 2019 American Medical Association. All rights reserved. The codes documented in this report are preliminary and upon coder review may  be revised to meet current compliance requirements. Justice Britain, MD 02/09/2020 2:31:51 PM Number of Addenda: 0

## 2020-02-09 NOTE — Progress Notes (Signed)
Patient BP 191/120 after labetalol.Dr Gifford Shave made aware . Hydralazine 10 mg IV orderes and given

## 2020-02-09 NOTE — Anesthesia Preprocedure Evaluation (Addendum)
Anesthesia Evaluation  Patient identified by MRN, date of birth, ID band Patient awake    Reviewed: Allergy & Precautions, NPO status , Patient's Chart, lab work & pertinent test results, reviewed documented beta blocker date and time   Airway Mallampati: IV  TM Distance: >3 FB Neck ROM: Full    Dental  (+) Teeth Intact, Dental Advisory Given   Pulmonary sleep apnea ,    Pulmonary exam normal breath sounds clear to auscultation       Cardiovascular hypertension, Pt. on medications and Pt. on home beta blockers +CHF  Normal cardiovascular exam+ dysrhythmias  Rhythm:Regular Rate:Normal     Neuro/Psych  Headaches, PSYCHIATRIC DISORDERS Depression TIA   GI/Hepatic Neg liver ROS, GERD  ,  Endo/Other  Morbid obesity  Renal/GU Renal InsufficiencyRenal disease     Musculoskeletal negative musculoskeletal ROS (+)   Abdominal   Peds  Hematology negative hematology ROS (+)   Anesthesia Other Findings Day of surgery medications reviewed with the patient.  Reproductive/Obstetrics                            Anesthesia Physical Anesthesia Plan  ASA: IV  Anesthesia Plan: MAC   Post-op Pain Management:    Induction: Intravenous  PONV Risk Score and Plan: 1 and Propofol infusion and Treatment may vary due to age or medical condition  Airway Management Planned: Nasal Cannula  Additional Equipment:   Intra-op Plan:   Post-operative Plan:   Informed Consent: I have reviewed the patients History and Physical, chart, labs and discussed the procedure including the risks, benefits and alternatives for the proposed anesthesia with the patient or authorized representative who has indicated his/her understanding and acceptance.     Dental advisory given  Plan Discussed with: CRNA and Anesthesiologist  Anesthesia Plan Comments:         Anesthesia Quick Evaluation

## 2020-02-09 NOTE — Op Note (Signed)
[facility] Patient Name: Frank Coffey Procedure Date : 02/09/2020 MRN: [id number] Attending MD: Justice Britain , MD Date of Birth: August 02, 1980 CSN: [id number] Age: 40 Admit Type: Outpatient Procedure:                Upper GI endoscopy Indications:              Generalized abdominal pain, Heartburn,                            Gastro-esophageal reflux disease, Abdominal                            bloating, Dark stools Providers:                Justice Britain, MD, Kary Kos RN, RN,                            Laverda Sorenson, Technician, Phill Myron. Proofreader,                            CRNA Referring MD:             Charlene Brooke Nche Medicines:                General Anesthesia Complications:            No immediate complications. Estimated Blood Loss:     Estimated blood loss was minimal. Procedure:                Pre-Anesthesia Assessment:                           - Prior to the procedure, a History and Physical                            was performed, and patient medications and                            allergies were reviewed. The patient's tolerance of                            previous anesthesia was also reviewed. The risks                            and benefits of the procedure and the sedation                            options and risks were discussed with the patient.                            All questions were answered, and informed consent                            was obtained. Prior Anticoagulants: The patient has                            taken no previous anticoagulant or  antiplatelet                            agents. ASA Grade Assessment: III - A patient with                            severe systemic disease. After reviewing the risks                            and benefits, the patient was deemed in                            satisfactory condition to undergo the procedure.                           After obtaining informed consent,  the endoscope was                            passed under direct vision. Throughout the                            procedure, the patient's blood pressure, pulse, and                            oxygen saturations were monitored continuously. The                            GIF-H190 ([id number]) Olympus gastroscope was                            introduced through the mouth, and advanced to the                            second part of duodenum. The upper GI endoscopy was                            accomplished without difficulty. The patient                            tolerated the procedure. Scope In: Scope Out: Findings:      No gross lesions were noted in the entire esophagus. Biopsies were taken       with a cold forceps for histology.      The Z-line was irregular and was found 45 cm from the incisors.      Scattered mild inflammation characterized by erosions, erythema and       friability was found in the gastric body and in the gastric antrum.      No other gross lesions were noted in the entire examined stomach.       Biopsies were taken with a cold forceps for histology and Helicobacter       pylori testing.      No gross lesions were noted in the duodenal bulb, in the first portion       of the duodenum and in the second portion of the duodenum. Biopsies were  taken with a cold forceps for histology. Impression:               - No gross lesions in esophagus. Biopsied. Z-line                            irregular, 45 cm from the incisors.                           - Gastritis. No other gross lesions in the stomach.                            Biopsied for HP.                           - No gross lesions in the duodenal bulb, in the                            first portion of the duodenum and in the second                            portion of the duodenum. Biopsied. Recommendation:           - Proceed to scheduled colonoscopy.                           - Observe patient's  clinical course.                           - Await pathology results.                           - Recommend initiation of PPI. Omeprazole 40 mg                            daily to see how things go in regards to healing                            gastritis and erosions, while also awaiting                            pathology.                           - The findings and recommendations were discussed                            with the patient.                           - The findings and recommendations were discussed                            with the designated responsible adult. Procedure Code(s):        --- Professional ---  79024, Esophagogastroduodenoscopy, flexible,                            transoral; with biopsy, single or multiple Diagnosis Code(s):        --- Professional ---                           K22.8, Other specified diseases of esophagus                           K29.70, Gastritis, unspecified, without bleeding                           R10.84, Generalized abdominal pain                           R12, Heartburn                           K21.9, Gastro-esophageal reflux disease without                            esophagitis                           R14.0, Abdominal distension (gaseous) CPT copyright 2019 American Medical Association. All rights reserved. The codes documented in this report are preliminary and upon coder review may  be revised to meet current compliance requirements. Justice Britain, MD 02/09/2020 2:27:39 PM Number of Addenda: 0

## 2020-02-09 NOTE — Anesthesia Procedure Notes (Signed)
Procedure Name: Intubation Date/Time: 02/09/2020 1:30 PM Performed by: Gaylene Brooks, CRNA Pre-anesthesia Checklist: Patient identified, Emergency Drugs available, Suction available and Patient being monitored Patient Re-evaluated:Patient Re-evaluated prior to induction Oxygen Delivery Method: Circle System Utilized Preoxygenation: Pre-oxygenation with 100% oxygen Induction Type: IV induction Ventilation: Oral airway inserted - appropriate to patient size and Two handed mask ventilation required Laryngoscope Size: Glidescope and 4 Grade View: Grade II Tube type: Oral Tube size: 8.0 mm Number of attempts: 1 Airway Equipment and Method: Stylet and Oral airway Placement Confirmation: ETT inserted through vocal cords under direct vision,  positive ETCO2 and breath sounds checked- equal and bilateral Secured at: 24 cm Tube secured with: Tape Dental Injury: Teeth and Oropharynx as per pre-operative assessment

## 2020-02-09 NOTE — H&P (Signed)
GASTROENTEROLOGY PROCEDURE H&P NOTE   Primary Care Physician: Flossie Buffy, NP  HPI: Frank Coffey is a 40 y.o. male who presents for EGD/Colonoscopy for evaluation of abdominal pain, change in bowel habits, bloating, GERD, family history of gastric cancer.  Past Medical History:  Diagnosis Date  . CHF (congestive heart failure) ([place])   . Chronic constipation   . Chronic kidney disease, stage 3, mod decreased GFR ([facility])    Followed by [facility]  . Depression    "when I had Covid"  . Gout   . 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 History of kidney stones    Passed  . Hx of migraine headaches   . Hyperlipidemia   . Hypertension   . 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 Obesity   . OSA (obstructive sleep apnea)   . Pneumonia 04/2019   with Covid   Past Surgical History:  Procedure Laterality Date  . CHOLECYSTECTOMY    . RETINAL DETACHMENT REPAIR W/ SCLERAL BUCKLE LE     Left   No current facility-administered medications for this encounter.   No Known Allergies Family History  Problem Relation Age of Onset  . Hypertension Mother   . Hyperlipidemia Mother   . Heart disease Mother   . Kidney disease Mother   . Hypertension Maternal Grandmother   . Stroke Maternal Grandmother   . Heart disease Maternal Grandmother   . Hyperlipidemia Maternal Grandmother   . Stroke Maternal Grandfather   . Hypertension Maternal Grandfather   . Heart disease Maternal Grandfather   . Hyperlipidemia Maternal Grandfather   . Alzheimer's disease Maternal Grandfather   . Cancer - Other Paternal Grandmother   . Healthy Brother        x2  . Healthy Sister        x2  . Stomach cancer Father   . Healthy Son        x2  . Healthy Daughter        x1  . Diabetes Neg Hx   . Heart attack Neg Hx   . Sudden death Neg Hx   . Colon cancer Neg Hx   . Esophageal cancer Neg Hx   . Pancreatic cancer Neg Hx   . Inflammatory bowel  disease Neg Hx   . Liver disease Neg Hx   . Rectal cancer Neg Hx    Social History   Socioeconomic History  . Marital status: Divorced    Spouse name: Not on file  . Number of children: Not on file  . Years of education: Not on file  . Highest education level: Not on file  Occupational History  . Not on file  Tobacco Use  . Smoking status: Never Smoker  . Smokeless tobacco: Never Used  Vaping Use  . Vaping Use: Never used  Substance and Sexual Activity  . Alcohol use: Not Currently    Comment: occ  . Drug use: No  . Sexual activity: Not on file  Other Topics Concern  . Not on file  Social History Narrative   Epworth Sleepiness Scale = 10 (as of 12/01/14)   Social Determinants of Health   Financial Resource Strain: Not on file  Food Insecurity: Not on file  Transportation Needs: Not on file  Physical Activity: Not on file  Stress: Not on file  Social Connections: Not on file  Intimate Partner Violence: Not on file    Physical Exam: Vital  signs in last 24 hours: Temp:  [97.7 F (36.5 C)] 97.7 F (36.5 C) (01/24 1252) Pulse Rate:  [78] 78 (01/24 1252) Resp:  [19] 19 (01/24 1252) BP: (185)/(106) 185/106 (01/24 1252) SpO2:  [95 %] 95 % (01/24 1252) Weight:  [174.2 kg] 174.2 kg (01/24 1252)   GEN: NAD EYE: Sclerae anicteric ENT: MMM CV: Non-tachycardic GI: Soft, NT/ND NEURO:  Alert & Oriented x 3  Lab Results: No results for input(s): WBC, HGB, HCT, PLT in the last 72 hours. BMET No results for input(s): NA, K, CL, CO2, GLUCOSE, BUN, CREATININE, CALCIUM in the last 72 hours. LFT No results for input(s): PROT, ALBUMIN, AST, ALT, ALKPHOS, BILITOT, BILIDIR, IBILI in the last 72 hours. PT/INR No results for input(s): LABPROT, INR in the last 72 hours.   Impression / Plan: This is a 40 y.o.male who presents for EGD/Colonoscopy for evaluation of abdominal pain, change in bowel habits, bloating, GERD, family history of gastric cancer.  The risks and benefits  of endoscopic evaluation were discussed with the patient; these include but are not limited to the risk of perforation, infection, bleeding, missed lesions, lack of diagnosis, severe illness requiring hospitalization, as well as anesthesia and sedation related illnesses.  The patient is agreeable to proceed.    Justice Britain, MD [facility] Advanced [facility]oscopy Office # [phone]

## 2020-02-09 NOTE — Progress Notes (Signed)
Patient blood pressure staying 169-184/120-127. Notified Dr Gifford Shave with anesthesia. Orders to give labetolol 10 mg  Once for diastolic greater than 897.

## 2020-02-09 NOTE — Transfer of Care (Signed)
Immediate Anesthesia Transfer of Care Note  Patient: SOREN LAZARZ  Procedure(s) Performed: COLONOSCOPY WITH PROPOFOL (N/A ) ESOPHAGOGASTRODUODENOSCOPY (EGD) WITH PROPOFOL (N/A ) BIOPSY POLYPECTOMY  Patient Location: Endoscopy Unit  Anesthesia Type:General  Level of Consciousness: awake, alert  and patient cooperative  Airway & Oxygen Therapy: Patient Spontanous Breathing and Patient connected to face mask oxygen  Post-op Assessment: Report given to RN, Post -op Vital signs reviewed and stable and Patient moving all extremities X 4  Post vital signs: Reviewed and stable  Last Vitals:  Vitals Value Taken Time  BP 180/159 02/09/20 1424  Temp    Pulse 85 02/09/20 1424  Resp 17 02/09/20 1424  SpO2 93 % 02/09/20 1424  Vitals shown include unvalidated device data.  Last Pain:  Vitals:   02/09/20 1252  TempSrc: Temporal  PainSc: 0-No pain         Complications: No complications documented.

## 2020-02-10 ENCOUNTER — Other Ambulatory Visit: Payer: Self-pay | Admitting: Physician Assistant

## 2020-02-10 LAB — SURGICAL PATHOLOGY

## 2020-02-10 NOTE — Anesthesia Postprocedure Evaluation (Signed)
Anesthesia Post Note  Patient: MARCELLOUS SNARSKI  Procedure(s) Performed: COLONOSCOPY WITH PROPOFOL (N/A ) ESOPHAGOGASTRODUODENOSCOPY (EGD) WITH PROPOFOL (N/A ) BIOPSY POLYPECTOMY     Patient location during evaluation: Endoscopy Anesthesia Type: General Level of consciousness: awake and alert, awake and oriented Pain management: pain level controlled Vital Signs Assessment: post-procedure vital signs reviewed and stable Respiratory status: spontaneous breathing, nonlabored ventilation, respiratory function stable and patient connected to nasal cannula oxygen Cardiovascular status: stable and blood pressure returned to baseline Postop Assessment: no apparent nausea or vomiting Anesthetic complications: no   No complications documented.  Last Vitals:  Vitals:   02/09/20 1513 02/09/20 1523  BP: (!) 191/120 (!) 162/106  Pulse: 74 70  Resp: (!) 27 (!) 34  Temp:    SpO2: 93% 95%    Last Pain:  Vitals:   02/09/20 1523  TempSrc:   PainSc: 0-No pain                 Catalina Gravel

## 2020-02-10 NOTE — Addendum Note (Signed)
Addendum  created 02/10/20 1006 by Catalina Gravel, MD   SmartForm saved

## 2020-02-12 ENCOUNTER — Ambulatory Visit (INDEPENDENT_AMBULATORY_CARE_PROVIDER_SITE_OTHER): Payer: 59 | Admitting: Internal Medicine

## 2020-02-12 ENCOUNTER — Other Ambulatory Visit: Payer: Self-pay

## 2020-02-12 ENCOUNTER — Encounter (HOSPITAL_COMMUNITY): Payer: Self-pay | Admitting: Gastroenterology

## 2020-02-12 VITALS — BP 130/70 | HR 75 | Ht 68.0 in | Wt 398.0 lb

## 2020-02-12 DIAGNOSIS — I5022 Chronic systolic (congestive) heart failure: Secondary | ICD-10-CM | POA: Diagnosis not present

## 2020-02-12 DIAGNOSIS — N184 Chronic kidney disease, stage 4 (severe): Secondary | ICD-10-CM | POA: Diagnosis not present

## 2020-02-12 DIAGNOSIS — I441 Atrioventricular block, second degree: Secondary | ICD-10-CM

## 2020-02-12 DIAGNOSIS — E785 Hyperlipidemia, unspecified: Secondary | ICD-10-CM

## 2020-02-12 MED ORDER — TORSEMIDE 20 MG PO TABS: 40.0000 mg | ORAL_TABLET | Freq: Two times a day (BID) | ORAL | 3 refills | Status: DC

## 2020-02-12 NOTE — Patient Instructions (Addendum)
Medication Instructions:  1) INCREASE TORSEMIDE to 60 mg for two days, then resume Torsemide 40 mg twice daily  2) STOP NIFEDIPINE *If you need a refill on your cardiac medications before your next appointment, please call your pharmacy*  Lab Work: TODAY! BNP If you have labs (blood work) drawn today and your tests are completely normal, you will receive your results only by: [name] MyChart Message (if you have MyChart) OR . A paper copy in the mail If you have any lab test that is abnormal or we need to change your treatment, we will call you to review the results.  Testing/Procedures: Your provider has requested that you have an echocardiogram. Echocardiography is a painless test that uses sound waves to create images of your heart. It provides your doctor with information about the size and shape of your heart and how well your heart's chambers and valves are working. This procedure takes approximately one hour. There are no restrictions for this procedure.  Follow-Up: Your provider recommends that you schedule a follow-up appointment in 2-3 months.  Bariatric Surgery You have so much to gain by losing weight.  You may have already tried every diet and exercise plan imaginable.  And, you may have sought advice from your family physician, too.   Sometimes, in spite of such diligent efforts, you may not be able to achieve long-term results by yourself.  In cases of severe obesity, bariatric or weight loss surgery is a proven method of achieving long-term weight control.  Our Services Our bariatric surgery programs offer our patients new hope and long-term weight-loss solution.  Since introducing our services in 2003, we have conducted more than 2,400 successful procedures.  Our program is designated as a Programmer, multimedia by the Metabolic and Bariatric Surgery Accreditation and Quality Improvement Program (MBSAQIP), a IT trainer that sets rigorous patient safety and outcome  standards.  Our program is also designated as a Ecologist by [employer].   Our exceptional weight-loss surgery team specializes in diagnosis, treatment, follow-up care, and ongoing support for our patients with severe weight loss challenges.  We currently offer laparoscopic sleeve gastrectomy, gastric bypass, and adjustable gastric band (LAP-BAND).    Attend our [place] Choosing to undergo a bariatric procedure is a big decision, and one that should not be taken lightly.  You now have two options in how you learn about weight-loss surgery - in person or online.  Our objective is to ensure you have all of the information that you need to evaluate the advantages and obligations of this life changing procedure.  Please note that you are not alone in this process, and our experienced team is ready to assist and answer all of your questions.  There are several ways to register for a seminar (either on-line or in person): 1)  Call [phone] 2) Go on-line to [facility] and register for either type of seminar.  [email or web address]

## 2020-02-12 NOTE — Progress Notes (Signed)
Cardiology Office Note:    Date:  02/12/2020   ID:  Frank Coffey, DOB 1980-11-11, MRN [id number]  PCP:  Flossie Buffy, NP  [facility] Cardiologist:  Skeet Latch, MD  [facility] Electrophysiologist:  None   CC: Tired post COVID Consulted for the evaluation of heart failure at the behest of [place], Charlene Brooke, NP  History of Present Illness:    Frank Coffey is a 40 y.o. 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  First diagnosed in 2005, First seen here in 2016.  Last seen in 2019 EF 35-40%.  Got COVID-19 in April 2021.  Note has had multiple visits for a variety of issues:  Lost taste and smell.  Had had abdominal pain and bloating and has seen GI.  Notes constipation.  Patient notes that he is feeling tired and fatigue since April.  Has had no chest pain, chest pressure, chest tightness, chest stinging.  Patient has started doing cardio and has improvement in his breathing.  Takes his torsemide (40 mg BID) and has a good response.  With this he has no shortness of breath, but still has some residual DOE .  Nephrology has stopped his spironolactone for worsening of his CKD.  Patient has has been taking his Coreg and notes that it improves his QOL.  Notes PND or orthopnea when he accidentally drinks too much, but has been much improved since his return to fluid restriction.  Notes bendopnea, weight gain, no leg swelling , or abdominal swelling.  No syncope or near syncope . Notes  no palpitations or funny heart beats.     Ambulatory BP 130/70-> need a thigh cuff.  Patient notes that he can tell when his blood pressure is high.  Takes it regularly.    Past Medical History:  Diagnosis Date  . CHF (congestive heart failure) ([place])   . Chronic constipation   . Chronic kidney disease, stage 3, mod decreased GFR (HCC)     Followed by [facility]  . Depression    "when I had Covid"  . Gout   . 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 History of kidney stones    Passed  . Hx of migraine headaches   . Hyperlipidemia   . Hypertension   . Hypertensive heart disease with congestive heart failure and stage 3 kidney disease ([place])   . Obesity   . OSA (obstructive sleep apnea)   . Pneumonia 04/2019   with Covid    Past Surgical History:  Procedure Laterality Date  . BIOPSY  02/09/2020   Procedure: BIOPSY;  Surgeon: Rush Landmark Telford Nab., MD;  Location: [place];  Service: Gastroenterology;;  . CHOLECYSTECTOMY    . COLONOSCOPY WITH PROPOFOL N/A 02/09/2020   Procedure: COLONOSCOPY WITH PROPOFOL;  Surgeon: Rush Landmark Telford Nab., MD;  Location: [place];  Service: Gastroenterology;  Laterality: N/A;  . ESOPHAGOGASTRODUODENOSCOPY (EGD) WITH PROPOFOL N/A 02/09/2020   Procedure: ESOPHAGOGASTRODUODENOSCOPY (EGD) WITH PROPOFOL;  Surgeon: Rush Landmark Telford Nab., MD;  Location: [place];  Service: Gastroenterology;  Laterality: N/A;  . POLYPECTOMY  02/09/2020   Procedure: POLYPECTOMY;  Surgeon: Rush Landmark Telford Nab., MD;  Location: [place];  Service: Gastroenterology;;  . RETINAL DETACHMENT REPAIR W/ SCLERAL BUCKLE LE     Left    Current Medications: Current Meds  Medication Sig  . allopurinol (ZYLOPRIM) 300  MG tablet TAKE 1 TABLET(300 MG) BY MOUTH DAILY  . amLODipine (NORVASC) 5 MG tablet Take 5 mg by mouth daily.  [name] atorvastatin (LIPITOR) 20 MG tablet Take 1 tablet (20 mg total) by mouth daily.  . brimonidine (ALPHAGAN) 0.2 % ophthalmic solution Place 1 drop into both eyes 2 (two) times daily.  . carvedilol (COREG) 25 MG tablet Take 1 tablet (25 mg total) by mouth 2 (two) times daily with a meal.  . dorzolamide-timolol (COSOPT) 22.3-6.8 MG/ML ophthalmic solution Place 1 drop into both eyes 2 (two) times daily.  [name] FIBER SUPPLEMENT PO Take 2 tablets by mouth daily. colon cleanse  . hydrALAZINE  (APRESOLINE) 100 MG tablet Take 1 tablet (100 mg total) by mouth 3 (three) times daily.  [name] latanoprost (XALATAN) 0.005 % ophthalmic solution Place 1 drop into the left eye at bedtime.  [name] linaclotide (LINZESS) 145 MCG CAPS capsule Take 1 capsule (145 mcg total) by mouth daily before breakfast.  . omeprazole (PRILOSEC) 40 MG capsule Take 1 capsule (40 mg total) by mouth daily.  . potassium chloride SA (K-DUR) 20 MEQ tablet Take 1 tablet (20 mEq total) by mouth 2 (two) times daily.  . [DISCONTINUED] NIFEdipine (ADALAT CC) 60 MG 24 hr tablet Take 1 tablet (60 mg total) by mouth at bedtime.  . [DISCONTINUED] torsemide (DEMADEX) 20 MG tablet Take 1 tablet (20 mg total) by mouth 2 (two) times daily. Additional refills from cardiology (Patient taking differently: Take 40 mg by mouth 2 (two) times daily. Additional refills from cardiology)     Allergies:   Patient has no known allergies.   Social History   Socioeconomic History  . Marital status: Divorced    Spouse name: Not on file  . Number of children: Not on file  . Years of education: Not on file  . Highest education level: Not on file  Occupational History  . Not on file  Tobacco Use  . Smoking status: Never Smoker  . Smokeless tobacco: Never Used  Vaping Use  . Vaping Use: Never used  Substance and Sexual Activity  . Alcohol use: Not Currently    Comment: occ  . Drug use: No  . Sexual activity: Not on file  Other Topics Concern  . Not on file  Social History Narrative   Epworth Sleepiness Scale = 10 (as of 12/01/14)   Social Determinants of Health   Financial Resource Strain: Not on file  Food Insecurity: Not on file  Transportation Needs: Not on file  Physical Activity: Not on file  Stress: Not on file  Social Connections: Not on file     Family History: The patient's family history includes Alzheimer's disease in his maternal grandfather; Cancer - Other in his paternal grandmother; Healthy in his brother, daughter,  sister, and son; Heart disease in his maternal grandfather, maternal grandmother, and mother; Hyperlipidemia in his maternal grandfather, maternal grandmother, and mother; Hypertension in his maternal grandfather, maternal grandmother, and mother; Kidney disease in his mother; Stomach cancer in his father; Stroke in his maternal grandfather and maternal grandmother. There is no history of Diabetes, Heart attack, Sudden death, Colon cancer, Esophageal cancer, Pancreatic cancer, Inflammatory bowel disease, Liver disease, or Rectal cancer.  ROS:   Please see the history of present illness.     All other systems reviewed and are negative.  EKGs/Labs/Other Studies Reviewed:    The following studies were reviewed today:  EKG:   02/12/2020: SR 1st HB LVH with 2nd repolarization 05/01/19: SR rate  49 with LVH  Cardiac Event Monitoring: Date: 11/28/2017 Results: Quality: Fair.  Baseline artifact. Predominant rhythm: sinus rhythm Average heart rate: 81 bpm Max heart rate: 114 bpm Min heart rate: 49 bpm  One episode of second degree heart block, Mobitz type I No other arrhythmias noted.     Transthoracic Echocardiogram: Date: 11/14/2017 Results: Study Conclusions  - Left ventricle: The cavity size was normal. Wall thickness was  normal. Systolic function was moderately reduced. The estimated  ejection fraction was in the range of 35% to 40%.   Impressions:   - Technically difficult images with poor image quality .  The LV function is moderately reduced.     Recent Labs: 04/30/2019: B Natriuretic Peptide 94.3 05/01/2019: Magnesium 2.3 07/15/2019: ALT 21 12/04/2019: Hemoglobin 11.8; Platelets 278.0; TSH 3.34 01/26/2020: BUN 35; Creatinine, Ser 4.58; Potassium 3.6; Sodium 136  Recent Lipid Panel    Component Value Date/Time   CHOL 190 05/29/2018 0923   TRIG 121 04/30/2019 1339   HDL 32.80 (L) 05/29/2018 0923   CHOLHDL 6 05/29/2018 0923   VLDL 20.8 05/29/2018 0923   LDLCALC  137 (H) 05/29/2018 0923    Risk Assessment/Calculations:    N/A  Physical Exam:    VS:  BP 130/70   Pulse 75   Ht 5\' 8"  (1.727 m)   Wt (!) 398 lb (180.5 kg)   SpO2 95%   BMI 60.52 kg/m     Wt Readings from Last 3 Encounters:  02/12/20 (!) 398 lb (180.5 kg)  02/09/20 (!) 384 lb (174.2 kg)  01/23/20 (!) 388 lb (176 kg)     GEN: Obese, well developed in no acute distress HEENT: Normal NECK: No JVD; No carotid bruits LYMPHATICS: No lymphadenopathy CARDIAC: RRR, no murmurs, rubs, gallops RESPIRATORY:  Clear to auscultation without rales, wheezing or rhonchi  ABDOMEN: Soft, non-tender, non-distended MUSCULOSKELETAL:  +1 edema; No deformity  SKIN: Warm and dry NEUROLOGIC:  Alert and oriented x 3 PSYCHIATRIC:  Normal affect   ASSESSMENT:    1. CKD (chronic kidney disease) stage 4, GFR 15-29 ml/min (HCC)   2. Chronic systolic CHF (congestive heart failure) (HCC)    PLAN:    In order of problems listed above:  Heart Failure reduced Ejection Fraction  - NYHA class II, Stage C, mildly hypervolemic, etiology unclear - Diuretic regimen: Will increase to torsemide 60 mg PO BID for two days then return to 40 mg PO BID  - Strict I/Os, daily weights, and fluid restriction of < 2 L  - Sending NT-proBNP  - continue Coreg - ARNI/ARB/ACEi/aldactone/SGLT2i stopped in the setting of CKD - Continue hydralazine - TTE ordered  - low threshold to recheck Iron post constipation work up  Essential Hypertension and morbid obesity - ambulatory blood pressure 130/70, will continue ambulatory BP monitoring; gave education on how to perform ambulatory blood pressure monitoring including the frequency and technique; goal ambulatory blood pressure < 135/85 on average - continue home medications with the exception of stopping nifedipine in the setting of HF (contraindication) - patient needs thigh cuff for BPs - discussed diet (DASH/low sodium), and exercise/weight loss interventions  -  Bariatric referral sent  Hyperlipidemia (uspecified) -LDL goal less than 100 -continue current statin - Recheck lipid profile LFTs - gave education on dietary changes  Will Fax Notes to [place]  Three months follow up unless new symptoms or abnormal test results warranting change in plan  Would be reasonable for  APP Follow up   Time Spent  Directly with Patient:   I have spent a total of 60 minutes with the patient reviewing hospital notes, old records, EKGs, labs and examining the patient as well as establishing an assessment and plan that was discussed personally with the patient.  > 50% of time was spent in direct patient care.  Medication Adjustments/Labs and Tests Ordered: Current medicines are reviewed at length with the patient today.  Concerns regarding medicines are outlined above.  Orders Placed This Encounter  Procedures  . Pro b natriuretic peptide (BNP)  . EKG 12-Lead  . ECHOCARDIOGRAM COMPLETE   Meds ordered this encounter  Medications  . torsemide (DEMADEX) 20 MG tablet    Sig: Take 2 tablets (40 mg total) by mouth 2 (two) times daily.    Dispense:  360 tablet    Refill:  3    Patient Instructions  Medication Instructions:  1) INCREASE TORSEMIDE to 60 mg for two days, then resume Torsemide 40 mg twice daily  2) STOP NIFEDIPINE *If you need a refill on your cardiac medications before your next appointment, please call your pharmacy*  Lab Work: TODAY! BNP If you have labs (blood work) drawn today and your tests are completely normal, you will receive your results only by: [name] MyChart Message (if you have MyChart) OR . A paper copy in the mail If you have any lab test that is abnormal or we need to change your treatment, we will call you to review the results.  Testing/Procedures: Your provider has requested that you have an echocardiogram. Echocardiography is a painless test that uses sound waves to create images of your heart. It  provides your doctor with information about the size and shape of your heart and how well your heart's chambers and valves are working. This procedure takes approximately one hour. There are no restrictions for this procedure.  Follow-Up: Your provider recommends that you schedule a follow-up appointment in 2-3 months.  Bariatric Surgery You have so much to gain by losing weight.  You may have already tried every diet and exercise plan imaginable.  And, you may have sought advice from your family physician, too.   Sometimes, in spite of such diligent efforts, you may not be able to achieve long-term results by yourself.  In cases of severe obesity, bariatric or weight loss surgery is a proven method of achieving long-term weight control.  Our Services Our bariatric surgery programs offer our patients new hope and long-term weight-loss solution.  Since introducing our services in 2003, we have conducted more than 2,400 successful procedures.  Our program is designated as a Programmer, multimedia by the Metabolic and Bariatric Surgery Accreditation and Quality Improvement Program (MBSAQIP), a IT trainer that sets rigorous patient safety and outcome standards.  Our program is also designated as a Ecologist by [employer].   Our exceptional weight-loss surgery team specializes in diagnosis, treatment, follow-up care, and ongoing support for our patients with severe weight loss challenges.  We currently offer laparoscopic sleeve gastrectomy, gastric bypass, and adjustable gastric band (LAP-BAND).    Attend our [place] Choosing to undergo a bariatric procedure is a big decision, and one that should not be taken lightly.  You now have two options in how you learn about weight-loss surgery - in person or online.  Our objective is to ensure you have all of the information that you need to evaluate the advantages and obligations of this life changing procedure.   Please note that you are  not alone in this process, and our experienced team is ready to assist and answer all of your questions.  There are several ways to register for a seminar (either on-line or in person): 1)  Call [phone] 2) Go on-line to [facility] and register for either type of seminar.  [email or web address]    Signed, Werner Lean, MD  02/12/2020 9:45 AM    [place]

## 2020-02-13 LAB — PRO B NATRIURETIC PEPTIDE: NT-Pro BNP: 821 pg/mL — ABNORMAL HIGH (ref 0–86)

## 2020-02-16 ENCOUNTER — Encounter: Payer: Self-pay | Admitting: Gastroenterology

## 2020-02-18 ENCOUNTER — Ambulatory Visit (HOSPITAL_COMMUNITY): Payer: 59 | Attending: Cardiology

## 2020-02-18 ENCOUNTER — Other Ambulatory Visit: Payer: Self-pay

## 2020-02-18 DIAGNOSIS — I5022 Chronic systolic (congestive) heart failure: Secondary | ICD-10-CM | POA: Diagnosis present

## 2020-02-18 LAB — ECHOCARDIOGRAM COMPLETE
Area-P 1/2: 2.46 cm2
S' Lateral: 5.5 cm

## 2020-02-20 ENCOUNTER — Telehealth: Payer: Self-pay

## 2020-02-20 NOTE — Telephone Encounter (Signed)
The patient has been notified of the result and verbalized understanding.  All questions (if any) were answered. Wilma Flavin, RN 02/20/2020 4:24 PM

## 2020-02-20 NOTE — Telephone Encounter (Signed)
-----   Message from Werner Lean, MD sent at 02/19/2020  5:06 PM EST ----- Results: Stable EF, borderline aortic root dilation Plan: Continue current plan   Werner Lean, MD

## 2020-02-23 ENCOUNTER — Other Ambulatory Visit: Payer: 59

## 2020-02-23 DIAGNOSIS — Z20822 Contact with and (suspected) exposure to covid-19: Secondary | ICD-10-CM

## 2020-02-24 ENCOUNTER — Telehealth: Payer: Self-pay | Admitting: Nurse Practitioner

## 2020-02-24 LAB — NOVEL CORONAVIRUS, NAA: SARS-CoV-2, NAA: NOT DETECTED

## 2020-02-24 LAB — SARS-COV-2, NAA 2 DAY TAT

## 2020-02-24 NOTE — Telephone Encounter (Signed)
Wait for form to be faxed. Just FYI for him: He will need cardiology clearance.

## 2020-02-24 NOTE — Telephone Encounter (Signed)
Pt said that his bariatric doctor needs a letter from [place] to approve bariatric surgery, please advise

## 2020-02-24 NOTE — Telephone Encounter (Signed)
Pt states a letter should be sent to our office to complete.

## 2020-02-25 ENCOUNTER — Encounter: Payer: Self-pay | Admitting: Nurse Practitioner

## 2020-02-25 ENCOUNTER — Telehealth (INDEPENDENT_AMBULATORY_CARE_PROVIDER_SITE_OTHER): Payer: 59 | Admitting: Nurse Practitioner

## 2020-02-25 VITALS — Ht 68.0 in | Wt 384.0 lb

## 2020-02-25 DIAGNOSIS — J069 Acute upper respiratory infection, unspecified: Secondary | ICD-10-CM | POA: Diagnosis not present

## 2020-02-25 DIAGNOSIS — Z20822 Contact with and (suspected) exposure to covid-19: Secondary | ICD-10-CM

## 2020-02-25 MED ORDER — BENZONATATE 100 MG PO CAPS: 100.0000 mg | ORAL_CAPSULE | Freq: Three times a day (TID) | ORAL | 0 refills | Status: DC | PRN

## 2020-02-25 NOTE — Patient Instructions (Signed)
Encourage adequate oral hydration. Avoid decongestants if you have high blood pressure. Use" Delsym" or" Robitussin" cough syrup varietis for cough.  You can use plain "Tylenol" or "Advi"l for fever, chills and achyness.

## 2020-02-25 NOTE — Progress Notes (Signed)
Virtual Visit via Video Note  I connected with@ on 02/28/20 at  9:00 AM EST by a video enabled telemedicine application and verified that I am speaking with the correct person using two identifiers.  Location: Patient:Home Provider: Office Participants: patient and provider  I discussed the limitations of evaluation and management by telemedicine and the availability of in person appointments. I also discussed with the patient that there may be a patient responsible charge related to this service. The patient expressed understanding and agreed to proceed.  CC:Pt states he was exposed to [place] on Friday of last week and he took a home COVID on Monday and it was negative, but patient has been experiencing a dry cough.   History of Present Illness: Child and male partner tested positive for COVID and are symptomatic. Onset of symptoms on Sunday. Negative COVID test on Monday. Denies any chest pain or worsening SOB or PND or fever or palpitations Has nonproductive cough, no OTC used  Wt Readings from Last 3 Encounters:  02/25/20 (!) 384 lb (174.2 kg)  02/12/20 (!) 398 lb (180.5 kg)  02/09/20 (!) 384 lb (174.2 kg)   Observations/Objective: Physical Exam Constitutional:      General: He is not in acute distress.    Appearance: He is obese.  Pulmonary:     Effort: Pulmonary effort is normal. No respiratory distress.  Neurological:     Mental Status: He is alert and oriented to person, place, and time.    Assessment and Plan: Amil was seen today for acute visit.  Diagnoses and all orders for this visit:  Viral upper respiratory tract infection -     benzonatate (TESSALON) 100 MG capsule; Take 1 capsule (100 mg total) by mouth 3 (three) times daily as needed. -     Ambulatory referral for Covid Treatment  Close exposure to COVID-19 virus -     Ambulatory referral for Covid Treatment   Follow Up Instructions: Encourage adequate oral hydration. Avoid decongestants. Use"  Delsym" or" Robitussin" cough syrup varietis for cough.  You can use plain "Tylenol" or "Advi"l for fever, chills and achyness.  I discussed the assessment and treatment plan with the patient. The patient was provided an opportunity to ask questions and all were answered. The patient agreed with the plan and demonstrated an understanding of the instructions.   The patient was advised to call back or seek an in-person evaluation if the symptoms worsen or if the condition fails to improve as anticipated.  [name], NP

## 2020-02-28 ENCOUNTER — Encounter: Payer: Self-pay | Admitting: Nurse Practitioner

## 2020-03-10 ENCOUNTER — Other Ambulatory Visit: Payer: Self-pay

## 2020-03-11 ENCOUNTER — Ambulatory Visit: Payer: 59 | Admitting: Nurse Practitioner

## 2020-03-11 DIAGNOSIS — Z0289 Encounter for other administrative examinations: Secondary | ICD-10-CM

## 2020-04-05 ENCOUNTER — Telehealth: Payer: Self-pay | Admitting: Nurse Practitioner

## 2020-04-05 ENCOUNTER — Encounter: Payer: Self-pay | Admitting: Nurse Practitioner

## 2020-04-05 LAB — BASIC METABOLIC PANEL
BUN: 51 — AB (ref 4–21)
CO2: 27 — AB (ref 13–22)
Chloride: 102 (ref 99–108)
Creatinine: 5 — AB (ref <=1.3)
Glucose: 128
Potassium: 3.3 — AB (ref 3.4–5.3)
Sodium: 140 (ref 137–147)

## 2020-04-05 LAB — CBC AND DIFFERENTIAL
HCT: 37 — AB (ref 41–53)
Hemoglobin: 11.8 — AB (ref 13.5–17.5)
Neutrophils Absolute: 6.2
Platelets: 285 (ref 150–399)
WBC: 8.6

## 2020-04-05 LAB — COMPREHENSIVE METABOLIC PANEL
Albumin: 4 (ref 3.5–5.0)
Calcium: 8.6 — AB (ref 8.7–10.7)

## 2020-04-05 LAB — CBC: RBC: 5.38 — AB (ref 3.87–5.11)

## 2020-04-05 NOTE — Telephone Encounter (Signed)
Pt was no show for appt 03/11/20 acute. 1st occurrence. Fee waived. Letter mailed.

## 2020-04-20 ENCOUNTER — Ambulatory Visit (INDEPENDENT_AMBULATORY_CARE_PROVIDER_SITE_OTHER): Payer: 59 | Admitting: Internal Medicine

## 2020-04-20 ENCOUNTER — Other Ambulatory Visit: Payer: Self-pay

## 2020-04-20 ENCOUNTER — Encounter: Payer: Self-pay | Admitting: Internal Medicine

## 2020-04-20 DIAGNOSIS — I129 Hypertensive chronic kidney disease with stage 1 through stage 4 chronic kidney disease, or unspecified chronic kidney disease: Secondary | ICD-10-CM | POA: Diagnosis not present

## 2020-04-20 DIAGNOSIS — E785 Hyperlipidemia, unspecified: Secondary | ICD-10-CM

## 2020-04-20 DIAGNOSIS — N184 Chronic kidney disease, stage 4 (severe): Secondary | ICD-10-CM

## 2020-04-20 DIAGNOSIS — I5022 Chronic systolic (congestive) heart failure: Secondary | ICD-10-CM

## 2020-04-20 MED ORDER — CARVEDILOL 25 MG PO TABS: 50.0000 mg | ORAL_TABLET | Freq: Two times a day (BID) | ORAL | 3 refills | Status: DC

## 2020-04-20 NOTE — Progress Notes (Signed)
Cardiology Office Note:    Date:  04/20/2020   ID:  Frank Coffey, DOB 08-25-1980, MRN [id number]  PCP:  Flossie Buffy, NP  [facility] Cardiologist:  Werner Lean, MD  [facility] Electrophysiologist:  None   CC: HF F/U  History of Present Illness:    Frank Coffey is a 41 y.o. 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  In interim of this visit, patient had borderline dilation of his thoracic aorta with stable EF.    Patient notes that he is doing much better.  Since last visit notes the he is starting to be more active.  Started to do some activities.  There are no interval hospital/ED visit.   No chest pain or pressure.  No SOB and DOE has improved PND/Orthopnea.  No weight gain and improve weight loss.  No palpitations or syncope.  No constipation.  Was found to have IDA and has started iron supplementation, with improvement.  Ambulatory blood pressure not done- discussed where to get a BP cuff.   Past Medical History:  Diagnosis Date  . CHF (congestive heart failure) ([place])   . Chronic constipation   . Chronic kidney disease, stage 3, mod decreased GFR (HCC)    Followed by [facility]  . Depression    "when I had Covid"  . Gout   . 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 History of kidney stones    Passed  . Hx of migraine headaches   . Hyperlipidemia   . Hypertension   . Hypertensive heart disease with congestive heart failure and stage 3 kidney disease ([place])   . Obesity   . OSA (obstructive sleep apnea)   . Pneumonia 04/2019   with Covid    Past Surgical History:  Procedure Laterality Date  . BIOPSY  02/09/2020   Procedure: BIOPSY;  Surgeon: Rush Landmark Telford Nab., MD;  Location: [place];  Service: Gastroenterology;;  . CHOLECYSTECTOMY    . COLONOSCOPY WITH PROPOFOL N/A 02/09/2020    Procedure: COLONOSCOPY WITH PROPOFOL;  Surgeon: Rush Landmark Telford Nab., MD;  Location: [place];  Service: Gastroenterology;  Laterality: N/A;  . ESOPHAGOGASTRODUODENOSCOPY (EGD) WITH PROPOFOL N/A 02/09/2020   Procedure: ESOPHAGOGASTRODUODENOSCOPY (EGD) WITH PROPOFOL;  Surgeon: Rush Landmark Telford Nab., MD;  Location: [place];  Service: Gastroenterology;  Laterality: N/A;  . POLYPECTOMY  02/09/2020   Procedure: POLYPECTOMY;  Surgeon: Rush Landmark Telford Nab., MD;  Location: [facility] ENDOSCOPY;  Service: Gastroenterology;;  . RETINAL DETACHMENT REPAIR W/ SCLERAL BUCKLE LE     Left    Current Medications: Current Meds  Medication Sig  . allopurinol (ZYLOPRIM) 300 MG tablet TAKE 1 TABLET(300 MG) BY MOUTH DAILY  . amLODipine (NORVASC) 5 MG tablet Take 5 mg by mouth daily.  [name] atorvastatin (LIPITOR) 20 MG tablet Take 1 tablet (20 mg total) by mouth daily.  . brimonidine (ALPHAGAN) 0.2 % ophthalmic solution Place 1 drop into both eyes 2 (two) times daily.  . dorzolamide-timolol (COSOPT) 22.3-6.8 MG/ML ophthalmic solution Place 1 drop into both eyes 2 (two) times daily.  [name] FIBER SUPPLEMENT PO Take 2 tablets by mouth daily. colon cleanse  . hydrALAZINE (APRESOLINE) 100 MG tablet Take 1 tablet (100 mg total) by mouth 3 (three) times daily.  [name] latanoprost (XALATAN) 0.005 % ophthalmic solution Place 1 drop into the left eye at bedtime.  [name]  linaclotide (LINZESS) 145 MCG CAPS capsule Take 1 capsule (145 mcg total) by mouth daily before breakfast.  . omeprazole (PRILOSEC) 40 MG capsule Take 1 capsule (40 mg total) by mouth daily.  . potassium chloride SA (K-DUR) 20 MEQ tablet Take 1 tablet (20 mEq total) by mouth 2 (two) times daily.  [name] torsemide (DEMADEX) 20 MG tablet Take 2 tablets (40 mg total) by mouth 2 (two) times daily. (Patient taking differently: Take 120 mg by mouth 2 (two) times daily. 4 tabs am, 2 tabs pm)  . [DISCONTINUED] benzonatate (TESSALON) 100 MG capsule Take 1 capsule (100 mg total) by  mouth 3 (three) times daily as needed.  . [DISCONTINUED] carvedilol (COREG) 25 MG tablet Take 1 tablet (25 mg total) by mouth 2 (two) times daily with a meal.  . [DISCONTINUED] carvedilol (COREG) 25 MG tablet Take 2 tablets (50 mg total) by mouth 2 (two) times daily.     Allergies:   Patient has no known allergies.   Social History   Socioeconomic History  . Marital status: Divorced    Spouse name: Not on file  . Number of children: Not on file  . Years of education: Not on file  . Highest education level: Not on file  Occupational History  . Not on file  Tobacco Use  . Smoking status: Never Smoker  . Smokeless tobacco: Never Used  Vaping Use  . Vaping Use: Never used  Substance and Sexual Activity  . Alcohol use: Not Currently    Comment: occ  . Drug use: No  . Sexual activity: Not on file  Other Topics Concern  . Not on file  Social History Narrative   Epworth Sleepiness Scale = 10 (as of 12/01/14)   Social Determinants of Health   Financial Resource Strain: Not on file  Food Insecurity: Not on file  Transportation Needs: Not on file  Physical Activity: Not on file  Stress: Not on file  Social Connections: Not on file     Family History: The patient's family history includes Alzheimer's disease in his maternal grandfather; Cancer - Other in his paternal grandmother; Healthy in his brother, daughter, sister, and son; Heart disease in his maternal grandfather, maternal grandmother, and mother; Hyperlipidemia in his maternal grandfather, maternal grandmother, and mother; Hypertension in his maternal grandfather, maternal grandmother, and mother; Kidney disease in his mother; Stomach cancer in his father; Stroke in his maternal grandfather and maternal grandmother. There is no history of Diabetes, Heart attack, Sudden death, Colon cancer, Esophageal cancer, Pancreatic cancer, Inflammatory bowel disease, Liver disease, or Rectal cancer. Notes that his mother died of NCIM NOS  and does not want to end up like her.  Notes that he was her POA and has records confirmed his previous records request.  ROS:   Please see the history of present illness.     All other systems reviewed and are negative.  EKGs/Labs/Other Studies Reviewed:    The following studies were reviewed today:  EKG:   02/12/2020: SR 1st HB LVH with 2nd repolarization 05/01/19: SR rate 62 with LVH  Cardiac Event Monitoring: Date: 11/28/2017 Results: Quality: Fair.  Baseline artifact. Predominant rhythm: sinus rhythm Average heart rate: 81 bpm Max heart rate: 114 bpm Min heart rate: 49 bpm  One episode of second degree heart block, Mobitz type I No other arrhythmias noted.     Transthoracic Echocardiogram: Date: 11/14/2017 Results: 1. Left ventricular ejection fraction, by estimation, is 35%. The left  ventricle has moderately decreased function.  The left ventricle  demonstrates global hypokinesis. The left ventricular internal cavity size  was moderately dilated. There is mild left  ventricular hypertrophy. Left ventricular diastolic parameters are  consistent with Grade II diastolic dysfunction (pseudonormalization).  2. Right ventricular systolic function is normal. The right ventricular  size is normal. Tricuspid regurgitation signal is inadequate for assessing  PA pressure.  3. Left atrial size was moderately dilated.  4. Right atrial size was mildly dilated.  5. The mitral valve is normal in structure. No evidence of mitral valve  regurgitation. No evidence of mitral stenosis.  6. The aortic valve is tricuspid. Aortic valve regurgitation is not  visualized. No aortic stenosis is present.  7. Aortic dilatation noted. There is mild dilatation of the aortic root,  measuring 37 mm.  8. The inferior vena cava is normal in size with greater than 50%  respiratory variability, suggesting right atrial pressure of 3 mmHg   Recent Labs: 04/30/2019: B Natriuretic Peptide  94.3 05/01/2019: Magnesium 2.3 07/15/2019: ALT 21 12/04/2019: TSH 3.34 02/12/2020: NT-Pro BNP 821 04/05/2020: BUN 51; Creatinine 5.0; Hemoglobin 11.8; Platelets 285; Potassium 3.3; Sodium 140  Recent Lipid Panel    Component Value Date/Time   CHOL 190 05/29/2018 0923   TRIG 121 04/30/2019 1339   HDL 32.80 (L) 05/29/2018 0923   CHOLHDL 6 05/29/2018 0923   VLDL 20.8 05/29/2018 0923   LDLCALC 137 (H) 05/29/2018 0923    Risk Assessment/Calculations:    N/A  Physical Exam:    VS:  BP (!) 142/70   Pulse 95   Ht 5\' 8"  (1.727 m)   Wt (!) 391 lb (177.4 kg)   SpO2 97%   BMI 59.45 kg/m     Wt Readings from Last 3 Encounters:  04/20/20 (!) 391 lb (177.4 kg)  02/25/20 (!) 384 lb (174.2 kg)  02/12/20 (!) 398 lb (180.5 kg)    GEN: Obese, well developed in no acute distress HEENT: Normal NECK: No JVD (challenging exam); No carotid bruits LYMPHATICS: No lymphadenopathy CARDIAC: RRR, no murmurs, rubs, gallops RESPIRATORY:  Clear to auscultation without rales, wheezing or rhonchi  ABDOMEN: Soft, non-tender, non-distended MUSCULOSKELETAL:  Non-pitting edema ; No deformity  SKIN: Warm and dry NEUROLOGIC:  Alert and oriented x 3 PSYCHIATRIC:  Normal affect   ASSESSMENT:    1. Morbid obesity ([place])   2. Chronic systolic CHF (congestive heart failure) ([place])   3. Benign hypertension with chronic kidney disease, stage IV ([place])   4. Hyperlipidemia, unspecified hyperlipidemia type    PLAN:    In order of problems listed above:  Heart Failure reduced Ejection Fraction - NYHA class II, Stage C, euvolemic, etiology unclear - Diuretic regimen: Continue 80 mg PO BID for two days then return to 40 mg PO BID  - Strict I/Os, daily weights, and fluid restriction of < 2 L  - Will increase coreg to 50 mg PO BID (he is > 85 kg) - ARNI/ARB/ACEi/aldactone/SGLT2i stopped in the setting of CKD - Continue hydralazine - anemia panel deferred- diagnosed with IDA and started on iron supplementation by  nephrology per patient  Essential Hypertension and morbid obesity - ambulatory blood pressure 130/70, will continue ambulatory BP monitoring; gave education on how to perform ambulatory blood pressure monitoring including the frequency and technique; goal ambulatory blood pressure < 135/85 on average - continue home medications with the exception of stopping nifedipine in the setting of HF (contraindication) - patient needs thigh cuff for Bps- showed him where appropriate cuffs could be  found - discussed diet (DASH/low sodium), and exercise/weight loss interventions   Hyperlipidemia (uspecified) -LDL goal less than 100 -continue current statin; low threshold to increase at next visit - gave education on dietary changes  Will Fax Notes to [place] follow up unless new symptoms or abnormal test results warranting change in plan  Would be reasonable for  Video Visit Follow up Would be reasonable for  APP Follow up     Medication Adjustments/Labs and Tests Ordered: Current medicines are reviewed at length with the patient today.  Concerns regarding medicines are outlined above.  No orders of the defined types were placed in this encounter.  Meds ordered this encounter  Medications  . DISCONTD: carvedilol (COREG) 25 MG tablet    Sig: Take 2 tablets (50 mg total) by mouth 2 (two) times daily.    Dispense:  180 tablet    Refill:  3  . carvedilol (COREG) 25 MG tablet    Sig: Take 2 tablets (50 mg total) by mouth 2 (two) times daily.    Dispense:  360 tablet    Refill:  3    Patient Instructions  Medication Instructions:  Your physician has recommended you make the following change in your medication:  INCREASE: carvedilol (Coreg) to 50 mg by mouth twice daily  *If you need a refill on your cardiac medications before your next appointment, please call your pharmacy*   Lab Work: NONE If you have labs (blood work) drawn today and your tests are completely  normal, you will receive your results only by: [name] MyChart Message (if you have MyChart) OR . A paper copy in the mail If you have any lab test that is abnormal or we need to change your treatment, we will call you to review the results.   Testing/Procedures: NONE   Follow-Up: At [facility], you and your health needs are our priority.  As part of our continuing mission to provide you with exceptional heart care, we have created designated Provider Care Teams.  These Care Teams include your primary Cardiologist (physician) and Advanced Practice Providers (APPs -  Physician Assistants and Nurse Practitioners) who all work together to provide you with the care you need, when you need it.  We recommend signing up for the patient portal called "MyChart".  Sign up information is provided on this After Visit Summary.  MyChart is used to connect with patients for Virtual Visits (Telemedicine).  Patients are able to view lab/test results, encounter notes, upcoming appointments, etc.  Non-urgent messages can be sent to your provider as well.   To learn more about what you can do with MyChart, go to [email or web address].    Your next appointment:   6-7 month(s)  The format for your next appointment:   In Person  Provider:   You may see Werner Lean, MD or one of the following Advanced Practice Providers on your designated Care Team:    Melina Copa, PA-C  Ermalinda Barrios, PA-C          Signed, Werner Lean, MD  04/20/2020 2:22 PM    [place]

## 2020-04-20 NOTE — Patient Instructions (Signed)
Medication Instructions:  Your physician has recommended you make the following change in your medication:  INCREASE: carvedilol (Coreg) to 50 mg by mouth twice daily  *If you need a refill on your cardiac medications before your next appointment, please call your pharmacy*   Lab Work: NONE If you have labs (blood work) drawn today and your tests are completely normal, you will receive your results only by: [name] MyChart Message (if you have MyChart) OR . A paper copy in the mail If you have any lab test that is abnormal or we need to change your treatment, we will call you to review the results.   Testing/Procedures: NONE   Follow-Up: At [facility], you and your health needs are our priority.  As part of our continuing mission to provide you with exceptional heart care, we have created designated Provider Care Teams.  These Care Teams include your primary Cardiologist (physician) and Advanced Practice Providers (APPs -  Physician Assistants and Nurse Practitioners) who all work together to provide you with the care you need, when you need it.  We recommend signing up for the patient portal called "MyChart".  Sign up information is provided on this After Visit Summary.  MyChart is used to connect with patients for Virtual Visits (Telemedicine).  Patients are able to view lab/test results, encounter notes, upcoming appointments, etc.  Non-urgent messages can be sent to your provider as well.   To learn more about what you can do with MyChart, go to [email or web address].    Your next appointment:   6-7 month(s)  The format for your next appointment:   In Person  Provider:   You may see Werner Lean, MD or one of the following Advanced Practice Providers on your designated Care Team:    Melina Copa, PA-C  Ermalinda Barrios, PA-C

## 2020-07-04 ENCOUNTER — Encounter (HOSPITAL_BASED_OUTPATIENT_CLINIC_OR_DEPARTMENT_OTHER): Payer: Self-pay | Admitting: Emergency Medicine

## 2020-07-04 ENCOUNTER — Emergency Department (HOSPITAL_BASED_OUTPATIENT_CLINIC_OR_DEPARTMENT_OTHER)
Admission: EM | Admit: 2020-07-04 | Discharge: 2020-07-04 | Disposition: A | Discharge status: Home / Self Care | Payer: 59 | Attending: Emergency Medicine | Admitting: Emergency Medicine

## 2020-07-04 ENCOUNTER — Other Ambulatory Visit: Payer: Self-pay

## 2020-07-04 DIAGNOSIS — N184 Chronic kidney disease, stage 4 (severe): Secondary | ICD-10-CM | POA: Diagnosis not present

## 2020-07-04 DIAGNOSIS — Z79899 Other long term (current) drug therapy: Secondary | ICD-10-CM | POA: Diagnosis not present

## 2020-07-04 DIAGNOSIS — Z20822 Contact with and (suspected) exposure to covid-19: Secondary | ICD-10-CM | POA: Diagnosis not present

## 2020-07-04 DIAGNOSIS — I5022 Chronic systolic (congestive) heart failure: Secondary | ICD-10-CM | POA: Diagnosis not present

## 2020-07-04 DIAGNOSIS — I129 Hypertensive chronic kidney disease with stage 1 through stage 4 chronic kidney disease, or unspecified chronic kidney disease: Secondary | ICD-10-CM | POA: Insufficient documentation

## 2020-07-04 DIAGNOSIS — J029 Acute pharyngitis, unspecified: Secondary | ICD-10-CM | POA: Diagnosis not present

## 2020-07-04 DIAGNOSIS — I10 Essential (primary) hypertension: Secondary | ICD-10-CM

## 2020-07-04 DIAGNOSIS — R059 Cough, unspecified: Secondary | ICD-10-CM | POA: Diagnosis present

## 2020-07-04 LAB — GROUP A STREP BY PCR: Group A Strep by PCR: NOT DETECTED

## 2020-07-04 LAB — RESP PANEL BY RT-PCR (FLU A&B, COVID) ARPGX2
Influenza A by PCR: NEGATIVE
Influenza B by PCR: NEGATIVE
SARS Coronavirus 2 by RT PCR: NEGATIVE

## 2020-07-04 MED ORDER — ACETAMINOPHEN 500 MG PO TABS
1000.0000 mg | ORAL_TABLET | Freq: Once | ORAL | Status: AC
  Administered 2020-07-04: 1000 mg via ORAL
  Filled 2020-07-04: qty 2

## 2020-07-04 NOTE — Discharge Instructions (Signed)
Viral Illness TREATMENT  Treatment is directed at relieving symptoms. There is no cure. Antibiotics are not effective, because the infection is caused by a virus, not by bacteria. Treatment may include:  Increased fluid intake. Sports drinks offer valuable electrolytes, sugars, and fluids.  Breathing heated mist or steam (vaporizer or shower).  Eating chicken soup or other clear broths, and maintaining good nutrition.  Getting plenty of rest.  Using gargles or lozenges for comfort.  Increasing usage of your inhaler if you have asthma.  Return to work when your temperature has returned to normal.  Gargle warm salt water and spit it out for sore throat. Take benadryl to decrease sinus secretions. Continue to alternate between Tylenol and ibuprofen for pain and fever control.  Follow Up: Follow up with your primary care doctor in 5-7 days for recheck of ongoing symptoms.  Return to emergency department for emergent changing or worsening of symptoms.  

## 2020-07-04 NOTE — ED Provider Notes (Signed)
[place] EMERGENCY DEPARTMENT Provider Note   CSN: [id number] Arrival date & time: 07/04/20  1225     History Chief Complaint  Patient presents with   Sore Throat    LEEVI CULLARS is a 40 y.o. male.  HPI Patient had congestion and sore throat since yesterday.  He denies any fevers or chills.  He states he has occasionally had a dry cough.  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  He states that he did not take his blood pressure medications this morning but he states that he normally does his last blood pressure at his last office visit of 140/90.  Denies any chest pain, shortness of breath, headache, dizziness.      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 ([place])    Obesity    OSA (obstructive sleep apnea)    Pneumonia 04/2019   with Covid    Patient Active Problem List   Diagnosis Date Noted   Morbid obesity ([place]) 02/12/2020   Change in bowel habits 12/09/2019   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   Bloating 12/09/2019   Dark stools 12/09/2019   Rectal bleeding 12/09/2019   Generalized abdominal pain 12/09/2019   Gastroesophageal reflux disease 12/09/2019   Decreased appetite 12/09/2019   Family history of gastric cancer 12/09/2019   Hyperlipidemia 04/30/2019   Elevated troponin 04/30/2019   Mobitz type 1 second degree atrioventricular block 11/18/2017   TIA (transient ischemic attack) 11/14/2017   Hypertensive urgency 11/14/2017   Migraine 10/02/2016   Erectile dysfunction 10/02/2016    Annual physical exam 02/22/2016   Onychomycosis 02/22/2016   Pre-operative cardiovascular examination 07/19/2015   NICM (nonischemic cardiomyopathy) ([place]) 07/19/2015   Morbid obesity due to excess calories ([place]) 07/19/2015   Chronic idiopathic constipation 16/07/3708   Chronic systolic CHF (congestive heart failure) ([place]) 12/19/2012   Benign hypertension with chronic kidney disease, stage IV ([place]) 12/19/2012   Gout 12/19/2012   CKD (chronic kidney disease) stage 4, GFR 15-29 ml/min ([place]) 12/19/2012   OSA (obstructive sleep apnea) 12/19/2012    Past Surgical History:  Procedure Laterality Date   BIOPSY  02/09/2020   Procedure: BIOPSY;  Surgeon: Irving Copas., MD;  Location: [facility] ENDOSCOPY;  Service: Gastroenterology;;   CHOLECYSTECTOMY     COLONOSCOPY WITH PROPOFOL N/A 02/09/2020   Procedure: COLONOSCOPY WITH PROPOFOL;  Surgeon: Irving Copas., MD;  Location: [facility] ENDOSCOPY;  Service: Gastroenterology;  Laterality: N/A;   ESOPHAGOGASTRODUODENOSCOPY (EGD) WITH PROPOFOL N/A 02/09/2020   Procedure: ESOPHAGOGASTRODUODENOSCOPY (EGD) WITH PROPOFOL;  Surgeon: Rush Landmark Telford Nab., MD;  Location: [place];  Service: Gastroenterology;  Laterality: N/A;   POLYPECTOMY  02/09/2020   Procedure: POLYPECTOMY;  Surgeon: Rush Landmark Telford Nab., MD;  Location: [facility] ENDOSCOPY;  Service: Gastroenterology;;   RETINAL DETACHMENT REPAIR W/ SCLERAL BUCKLE LE     Left       Family History  Problem Relation Age of Onset   Hypertension Mother    Hyperlipidemia Mother  Heart disease Mother    Kidney disease Mother    Hypertension Maternal Grandmother    Stroke Maternal Grandmother    Heart disease Maternal Grandmother    Hyperlipidemia Maternal Grandmother    Stroke Maternal Grandfather    Hypertension Maternal Grandfather    Heart disease Maternal Grandfather    Hyperlipidemia Maternal Grandfather    Alzheimer's disease Maternal Grandfather    Cancer - Other Paternal  Grandmother    Healthy Brother        x2   Healthy Sister        x2   Stomach cancer Father    Healthy Son        x2   Healthy Daughter        x1   Diabetes Neg Hx    Heart attack Neg Hx    Sudden death Neg Hx    Colon cancer Neg Hx    Esophageal cancer Neg Hx    Pancreatic cancer Neg Hx    Inflammatory bowel disease Neg Hx    Liver disease Neg Hx    Rectal cancer Neg Hx     Social History   Tobacco Use   Smoking status: Never   Smokeless tobacco: Never  Vaping Use   Vaping Use: Never used  Substance Use Topics   Alcohol use: Not Currently    Comment: occ   Drug use: No    Home Medications Prior to Admission medications   Medication Sig Start Date End Date Taking? Authorizing Provider  allopurinol (ZYLOPRIM) 300 MG tablet TAKE 1 TABLET(300 MG) BY MOUTH DAILY 05/31/19   Elby Beck, FNP  amLODipine (NORVASC) 5 MG tablet Take 5 mg by mouth daily. 01/27/20   [provider]  atorvastatin (LIPITOR) 20 MG tablet Take 1 tablet (20 mg total) by mouth daily. 05/29/18   Elby Beck, FNP  brimonidine (ALPHAGAN) 0.2 % ophthalmic solution Place 1 drop into both eyes 2 (two) times daily. 03/19/19   [provider]  carvedilol (COREG) 25 MG tablet Take 2 tablets (50 mg total) by mouth 2 (two) times daily. 04/20/20   Chandrasekhar, Lyda Kalata A, MD  dorzolamide-timolol (COSOPT) 22.3-6.8 MG/ML ophthalmic solution Place 1 drop into both eyes 2 (two) times daily. 03/10/19   [provider]  EQ FIBER SUPPLEMENT PO Take 2 tablets by mouth daily. colon cleanse    [provider]  hydrALAZINE (APRESOLINE) 100 MG tablet Take 1 tablet (100 mg total) by mouth 3 (three) times daily. 01/23/20   Nche, Charlene Brooke, NP  latanoprost (XALATAN) 0.005 % ophthalmic solution Place 1 drop into the left eye at bedtime. 04/14/19   [provider]  linaclotide Rolan Lipa) 145 MCG CAPS capsule Take 1 capsule (145 mcg total) by mouth daily before breakfast. 10/13/19    Nche, Charlene Brooke, NP  omeprazole (PRILOSEC) 40 MG capsule Take 1 capsule (40 mg total) by mouth daily. 02/09/20 02/08/21  Mansouraty, Telford Nab., MD  potassium chloride SA (K-DUR) 20 MEQ tablet Take 1 tablet (20 mEq total) by mouth 2 (two) times daily. 09/03/18   Horton, Barbette Hair, MD  torsemide (DEMADEX) 20 MG tablet Take 2 tablets (40 mg total) by mouth 2 (two) times daily. Patient taking differently: Take 120 mg by mouth 2 (two) times daily. 4 tabs am, 2 tabs pm 02/12/20 02/06/21  Werner Lean, MD    Allergies    Patient has no known allergies.  Review of Systems   Review of Systems  Constitutional:  Negative for fever.  HENT:  Positive for sore throat. Negative for congestion.   Respiratory:  Negative for shortness of breath.   Cardiovascular:  Negative for chest pain.  Gastrointestinal:  Negative for abdominal distention.  Neurological:  Negative for dizziness and headaches.   Physical Exam Updated Vital Signs BP (!) 193/138 (BP Location: Left Arm) Comment (BP Location): forearm  Pulse 78   Temp 98.6 F (37 C) (Oral)   Resp 18   Ht 5' 8.5" (1.74 m)   Wt (!) 169.6 kg   SpO2 98%   BMI 56.04 kg/m   Physical Exam Vitals and nursing note reviewed.  Constitutional:      General: He is not in acute distress.    Appearance: Normal appearance. He is not ill-appearing.  HENT:     Head: Normocephalic and atraumatic.     Mouth/Throat:     Comments: Posterior pharynx with some erythema some postnasal drainage.  No tonsillar hypertrophy or exudates. Eyes:     General: No scleral icterus.       Right eye: No discharge.        Left eye: No discharge.     Conjunctiva/sclera: Conjunctivae normal.  Pulmonary:     Effort: Pulmonary effort is normal.     Breath sounds: No stridor.  Neurological:     Mental Status: He is alert and oriented to person, place, and time. Mental status is at baseline.    ED Results / Procedures / Treatments   Labs (all labs ordered are  listed, but only abnormal results are displayed) Labs Reviewed  GROUP A STREP BY PCR  RESP PANEL BY RT-PCR (FLU A&B, COVID) [id number]    EKG None  Radiology No results found.  Procedures Procedures   Medications Ordered in ED Medications  acetaminophen (TYLENOL) tablet 1,000 mg (1,000 mg Oral Given 07/04/20 1424)    ED Course  I have reviewed the triage vital signs and the nursing notes.  Pertinent labs & imaging results that were available during my care of the patient were reviewed by me and considered in my medical decision making (see chart for details).  Clinical Course as of 07/04/20 2050  Sun Jul 04, 2020  1415 Group A Strep by PCR Rapid strep PCR negative.  COVID test pending at time of discharge.  Patient will follow-up on MyChart.  Patient significantly hypertensive here in ER.  On my evaluation patient is blood pressure is 170/100.  Earlier blood pressure readings were down from a forearm cuff.  Patient states he has not taken blood pressure medications this morning will take when he arrives home.  [WF]    Clinical Course User Index [WF] Tedd Sias, [place]   MDM Rules/Calculators/A&P                          Patient with sore throat.  Overall well-appearing 40 year old male is significantly hypertensive however has not taken his blood pressure medications today.  Throat is unremarkable does have some posterior nasal drainage.  Recommended Flonase and over over-the-counter medications.  Conservative therapy and follow-up with PCP.  COVID and PCR strep test negative. Final Clinical Impression(s) / ED Diagnoses Final diagnoses:  Pharyngitis, unspecified etiology  Hypertension, unspecified type    Rx / DC Orders ED Discharge Orders     None        Tedd Sias, [place] 07/04/20 2051    Blanchie Dessert, MD 07/05/20 1455

## 2020-07-04 NOTE — ED Triage Notes (Signed)
Pt reports sore throat since yesterday.  

## 2020-07-04 NOTE — ED Notes (Signed)
Works 3rd shift, has not taken BP medicine yet today.

## 2020-07-15 ENCOUNTER — Telehealth: Payer: 59 | Admitting: Physician Assistant

## 2020-07-15 DIAGNOSIS — R059 Cough, unspecified: Secondary | ICD-10-CM

## 2020-07-16 ENCOUNTER — Other Ambulatory Visit: Payer: Self-pay | Admitting: Nurse Practitioner

## 2020-07-16 DIAGNOSIS — R059 Cough, unspecified: Secondary | ICD-10-CM

## 2020-07-16 MED ORDER — PREDNISONE 10 MG (21) PO TBPK: ORAL_TABLET | ORAL | 0 refills | Status: DC

## 2020-07-16 MED ORDER — BENZONATATE 100 MG PO CAPS: 100.0000 mg | ORAL_CAPSULE | Freq: Three times a day (TID) | ORAL | 0 refills | Status: DC | PRN

## 2020-07-16 NOTE — Progress Notes (Signed)
We are sorry that you are not feeling well.  Here is how we plan to help!  Based on your presentation I believe you most likely have A cough due to a virus.  This is called viral bronchitis and is best treated by rest, plenty of fluids and control of the cough.  You may use Ibuprofen or Tylenol as directed to help your symptoms.     In addition you may use A prescription cough medication called Tessalon Perles 136m. You may take 1-2 capsules every 8 hours as needed for your cough.  Prednisone 10 mg daily for 6 days (see taper instructions below)  Directions for 6 day taper: Day 1: 2 tablets before breakfast, 1 after both lunch & dinner and 2 at bedtime Day 2: 1 tab before breakfast, 1 after both lunch & dinner and 2 at bedtime Day 3: 1 tab at each meal & 1 at bedtime Day 4: 1 tab at breakfast, 1 at lunch, 1 at bedtime Day 5: 1 tab at breakfast & 1 tab at bedtime Day 6: 1 tab at breakfast  From your responses in the eVisit questionnaire you describe inflammation in the upper respiratory tract which is causing a significant cough.  This is commonly called Bronchitis and has four common causes:   Allergies Viral Infections Acid Reflux Bacterial Infection Allergies, viruses and acid reflux are treated by controlling symptoms or eliminating the cause. An example might be a cough caused by taking certain blood pressure medications. You stop the cough by changing the medication. Another example might be a cough caused by acid reflux. Controlling the reflux helps control the cough.  USE OF BRONCHODILATOR ("RESCUE") INHALERS: There is a risk from using your bronchodilator too frequently.  The risk is that over-reliance on a medication which only relaxes the muscles surrounding the breathing tubes can reduce the effectiveness of medications prescribed to reduce swelling and congestion of the tubes themselves.  Although you feel brief relief from the bronchodilator inhaler, your asthma may actually be  worsening with the tubes becoming more swollen and filled with mucus.  This can delay other crucial treatments, such as oral steroid medications. If you need to use a bronchodilator inhaler daily, several times per day, you should discuss this with your provider.  There are probably better treatments that could be used to keep your asthma under control.     HOME CARE Only take medications as instructed by your medical team. Complete the entire course of an antibiotic. Drink plenty of fluids and get plenty of rest. Avoid close contacts especially the very young and the elderly Cover your mouth if you cough or cough into your sleeve. Always remember to wash your hands A steam or ultrasonic humidifier can help congestion.   GET HELP RIGHT AWAY IF: You develop worsening fever. You become short of breath You cough up blood. Your symptoms persist after you have completed your treatment plan MAKE SURE YOU  Understand these instructions. Will watch your condition. Will get help right away if you are not doing well or get worse.    Thank you for choosing an e-visit.  Your e-visit answers were reviewed by a board certified advanced clinical practitioner to complete your personal care plan. Depending upon the condition, your plan could have included both over the counter or prescription medications.  Please review your pharmacy choice. Make sure the pharmacy is open so you can pick up prescription now. If there is a problem, you may contact your provider  through [employer] and have the prescription routed to another pharmacy.  Your safety is important to [place]. If you have drug allergies check your prescription carefully.   For the next 24 hours you can use MyChart to ask questions about today's visit, request a non-urgent call back, or ask for a work or school excuse. You will get an email in the next two days asking about your experience. I hope that your e-visit has been valuable and will  speed your recovery.  I provided 5 minutes of non face-to-face time during this encounter for chart review and documentation.

## 2020-07-16 NOTE — Addendum Note (Signed)
Addended by: Mar Daring on: 07/16/2020 10:10 AM   Modules accepted: Orders

## 2020-08-10 ENCOUNTER — Encounter (HOSPITAL_BASED_OUTPATIENT_CLINIC_OR_DEPARTMENT_OTHER): Payer: Self-pay | Admitting: Emergency Medicine

## 2020-08-10 ENCOUNTER — Emergency Department (HOSPITAL_BASED_OUTPATIENT_CLINIC_OR_DEPARTMENT_OTHER): Payer: 59

## 2020-08-10 ENCOUNTER — Other Ambulatory Visit: Payer: Self-pay

## 2020-08-10 ENCOUNTER — Emergency Department (HOSPITAL_BASED_OUTPATIENT_CLINIC_OR_DEPARTMENT_OTHER)
Admission: EM | Admit: 2020-08-10 | Discharge: 2020-08-10 | Disposition: A | Discharge status: Home / Self Care | Payer: 59 | Attending: Emergency Medicine | Admitting: Emergency Medicine

## 2020-08-10 DIAGNOSIS — Z79899 Other long term (current) drug therapy: Secondary | ICD-10-CM | POA: Insufficient documentation

## 2020-08-10 DIAGNOSIS — I5021 Acute systolic (congestive) heart failure: Secondary | ICD-10-CM

## 2020-08-10 DIAGNOSIS — I5022 Chronic systolic (congestive) heart failure: Secondary | ICD-10-CM | POA: Diagnosis not present

## 2020-08-10 DIAGNOSIS — R600 Localized edema: Secondary | ICD-10-CM | POA: Diagnosis not present

## 2020-08-10 DIAGNOSIS — M7989 Other specified soft tissue disorders: Secondary | ICD-10-CM | POA: Diagnosis present

## 2020-08-10 DIAGNOSIS — N184 Chronic kidney disease, stage 4 (severe): Secondary | ICD-10-CM | POA: Diagnosis not present

## 2020-08-10 DIAGNOSIS — I130 Hypertensive heart and chronic kidney disease with heart failure and stage 1 through stage 4 chronic kidney disease, or unspecified chronic kidney disease: Secondary | ICD-10-CM | POA: Diagnosis not present

## 2020-08-10 DIAGNOSIS — N179 Acute kidney failure, unspecified: Secondary | ICD-10-CM

## 2020-08-10 LAB — BASIC METABOLIC PANEL
Anion gap: 9 (ref 5–15)
BUN: 53 mg/dL — ABNORMAL HIGH (ref 6–20)
CO2: 28 mmol/L (ref 22–32)
Calcium: 8.4 mg/dL — ABNORMAL LOW (ref 8.9–10.3)
Chloride: 102 mmol/L (ref 98–111)
Creatinine, Ser: 5.8 mg/dL — ABNORMAL HIGH (ref 0.61–1.24)
GFR, Estimated: 12 mL/min — ABNORMAL LOW (ref >60)
Glucose, Bld: 103 mg/dL — ABNORMAL HIGH (ref 70–99)
Potassium: 3.5 mmol/L (ref 3.5–5.1)
Sodium: 139 mmol/L (ref 135–145)

## 2020-08-10 LAB — CBC WITH DIFFERENTIAL/PLATELET
Abs Immature Granulocytes: 0.04 10*3/uL (ref 0.00–0.07)
Basophils Absolute: 0 10*3/uL (ref 0.0–0.1)
Basophils Relative: 0 %
Eosinophils Absolute: 0.3 10*3/uL (ref 0.0–0.5)
Eosinophils Relative: 3 %
HCT: 39.7 % (ref 39.0–52.0)
Hemoglobin: 12 g/dL — ABNORMAL LOW (ref 13.0–17.0)
Immature Granulocytes: 0 %
Lymphocytes Relative: 10 %
Lymphs Abs: 1 10*3/uL (ref 0.7–4.0)
MCH: 21.6 pg — ABNORMAL LOW (ref 26.0–34.0)
MCHC: 30.2 g/dL (ref 30.0–36.0)
MCV: 71.5 fL — ABNORMAL LOW (ref 80.0–100.0)
Monocytes Absolute: 0.8 10*3/uL (ref 0.1–1.0)
Monocytes Relative: 8 %
Neutro Abs: 7.3 10*3/uL (ref 1.7–7.7)
Neutrophils Relative %: 79 %
Platelets: 288 10*3/uL (ref 150–400)
RBC: 5.55 MIL/uL (ref 4.22–5.81)
RDW: 21.2 % — ABNORMAL HIGH (ref 11.5–15.5)
WBC: 9.5 10*3/uL (ref 4.0–10.5)
nRBC: 0 % (ref 0.0–0.2)

## 2020-08-10 LAB — BRAIN NATRIURETIC PEPTIDE: B Natriuretic Peptide: 315.8 pg/mL — ABNORMAL HIGH (ref 0.0–100.0)

## 2020-08-10 MED ORDER — CARVEDILOL 12.5 MG PO TABS
25.0000 mg | ORAL_TABLET | Freq: Two times a day (BID) | ORAL | Status: DC
  Administered 2020-08-10: 25 mg via ORAL
  Filled 2020-08-10: qty 2

## 2020-08-10 MED ORDER — AMLODIPINE BESYLATE 5 MG PO TABS
5.0000 mg | ORAL_TABLET | Freq: Once | ORAL | Status: AC
  Administered 2020-08-10: 5 mg via ORAL
  Filled 2020-08-10: qty 1

## 2020-08-10 MED ORDER — FUROSEMIDE 10 MG/ML IJ SOLN
80.0000 mg | Freq: Once | INTRAMUSCULAR | Status: AC
  Administered 2020-08-10: 80 mg via INTRAVENOUS
  Filled 2020-08-10: qty 8

## 2020-08-10 MED ORDER — HYDRALAZINE HCL 20 MG/ML IJ SOLN
40.0000 mg | Freq: Once | INTRAMUSCULAR | Status: AC
  Administered 2020-08-10: 40 mg via INTRAVENOUS
  Filled 2020-08-10: qty 2

## 2020-08-10 NOTE — Progress Notes (Signed)
Patient fell asleep on [place] and SAT dropped into the 70's. Spoke with patient and he agreed to go back on BiPAP. Wore for approx 5 min and took off. Stated he would rather stay awake then wear it at this time. Placed on 4LNC just in case he dozes back off. RN aware of situation

## 2020-08-10 NOTE — ED Provider Notes (Signed)
[place] EMERGENCY DEPARTMENT Provider Note   CSN: [id number] Arrival date & time: 08/10/20  9449     History Chief Complaint  Patient presents with   Foot Swelling    Frank Coffey is a 40 y.o. male.  HPI     This is a 40 year old male with history of heart failure, chronic kidney disease, hypertension, hyperlipidemia who presents with leg swelling.  Patient noted increased leg swelling yesterday evening.  He took his nighttime diuretic.  He states that despite keeping his legs elevated, he still feels they are swollen although they are slightly improved.  Over the last 1-2 nights he has had some increased insomnia which she relates to lying flat.  He also states "I have drooled more and feel more wet."  Reports good urine output.  No chest pain.  Does report some shortness of breath.  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 ([place])    Obesity    OSA (obstructive sleep apnea)    Pneumonia 04/2019   with Covid    Patient Active Problem List   Diagnosis Date Noted   Morbid obesity ([place]) 02/12/2020   Change in bowel habits 12/09/2019   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   Bloating 12/09/2019   Dark stools 12/09/2019   Rectal bleeding 12/09/2019   Generalized abdominal pain 12/09/2019   Gastroesophageal reflux disease 12/09/2019   Decreased appetite 12/09/2019   Family history of gastric cancer 12/09/2019   Hyperlipidemia 04/30/2019   Elevated troponin 04/30/2019   Mobitz type 1 second degree atrioventricular block 11/18/2017   TIA  (transient ischemic attack) 11/14/2017   Hypertensive urgency 11/14/2017   Migraine 10/02/2016   Erectile dysfunction 10/02/2016   Annual physical exam 02/22/2016   Onychomycosis 02/22/2016   Pre-operative cardiovascular examination 07/19/2015   NICM (nonischemic cardiomyopathy) ([place]) 07/19/2015   Morbid obesity due to excess calories ([place]) 07/19/2015   Chronic idiopathic constipation 67/59/1638   Chronic systolic CHF (congestive heart failure) ([place]) 12/19/2012   Benign hypertension with chronic kidney disease, stage IV ([place]) 12/19/2012   Gout 12/19/2012   CKD (chronic kidney disease) stage 4, GFR 15-29 ml/min ([place]) 12/19/2012   OSA (obstructive sleep apnea) 12/19/2012    Past Surgical History:  Procedure Laterality Date   BIOPSY  02/09/2020   Procedure: BIOPSY;  Surgeon: Irving Copas., MD;  Location: [facility] END[facility]COPY;  Service: Gastroenterology;;   CHOLECYSTECTOMY     COLONOSCOPY WITH PROPOFOL N/A 02/09/2020   Procedure: COLONOSCOPY WITH PROPOFOL;  Surgeon: Irving Copas., MD;  Location: [facility] ENDOSCOPY;  Service: Gastroenterology;  Laterality: N/A;   ESOPHAGOGASTRODUODENOSCOPY (EGD) WITH PROPOFOL N/A 02/09/2020   Procedure: ESOPHAGOGASTRODUODENOSCOPY (EGD) WITH PROPOFOL;  Surgeon: Rush Landmark Telford Nab., MD;  Location: [place];  Service: Gastroenterology;  Laterality: N/A;   POLYPECTOMY  02/09/2020   Procedure: POLYPECTOMY;  Surgeon: Rush Landmark Telford Nab., MD;  Location: [place];  Service: Gastroenterology;;   RETINAL DETACHMENT REPAIR W/ SCLERAL BUCKLE LE     Left  Family History  Problem Relation Age of Onset   Hypertension Mother    Hyperlipidemia Mother    Heart disease Mother    Kidney disease Mother    Hypertension Maternal Grandmother    Stroke Maternal Grandmother    Heart disease Maternal Grandmother    Hyperlipidemia Maternal Grandmother    Stroke Maternal Grandfather    Hypertension Maternal Grandfather    Heart disease Maternal  Grandfather    Hyperlipidemia Maternal Grandfather    Alzheimer's disease Maternal Grandfather    Cancer - Other Paternal Grandmother    Healthy Brother        x2   Healthy Sister        x2   Stomach cancer Father    Healthy Son        x2   Healthy Daughter        x1   Diabetes Neg Hx    Heart attack Neg Hx    Sudden death Neg Hx    Colon cancer Neg Hx    Esophageal cancer Neg Hx    Pancreatic cancer Neg Hx    Inflammatory bowel disease Neg Hx    Liver disease Neg Hx    Rectal cancer Neg Hx     Social History   Tobacco Use   Smoking status: Never   Smokeless tobacco: Never  Vaping Use   Vaping Use: Never used  Substance Use Topics   Alcohol use: Not Currently    Comment: occ   Drug use: No    Home Medications Prior to Admission medications   Medication Sig Start Date End Date Taking? Authorizing Provider  allopurinol (ZYLOPRIM) 300 MG tablet TAKE 1 TABLET(300 MG) BY MOUTH DAILY 05/31/19   Elby Beck, FNP  amLODipine (NORVASC) 5 MG tablet Take 5 mg by mouth daily. 01/27/20   [provider]  atorvastatin (LIPITOR) 20 MG tablet Take 1 tablet (20 mg total) by mouth daily. 05/29/18   Elby Beck, FNP  benzonatate (TESSALON) 100 MG capsule Take 1 capsule (100 mg total) by mouth 3 (three) times daily as needed. 07/16/20   Mar Daring, PA-C  brimonidine (ALPHAGAN) 0.2 % ophthalmic solution Place 1 drop into both eyes 2 (two) times daily. 03/19/19   [provider]  carvedilol (COREG) 25 MG tablet Take 2 tablets (50 mg total) by mouth 2 (two) times daily. 04/20/20   Chandrasekhar, Lyda Kalata A, MD  dorzolamide-timolol (COSOPT) 22.3-6.8 MG/ML ophthalmic solution Place 1 drop into both eyes 2 (two) times daily. 03/10/19   [provider]  EQ FIBER SUPPLEMENT PO Take 2 tablets by mouth daily. colon cleanse    [provider]  hydrALAZINE (APRESOLINE) 100 MG tablet Take 1 tablet (100 mg total) by mouth 3 (three) times daily. 01/23/20    Nche, Charlene Brooke, NP  latanoprost (XALATAN) 0.005 % ophthalmic solution Place 1 drop into the left eye at bedtime. 04/14/19   [provider]  linaclotide Rolan Lipa) 145 MCG CAPS capsule Take 1 capsule (145 mcg total) by mouth daily before breakfast. 10/13/19   Nche, Charlene Brooke, NP  omeprazole (PRILOSEC) 40 MG capsule Take 1 capsule (40 mg total) by mouth daily. 02/09/20 02/08/21  Mansouraty, Telford Nab., MD  potassium chloride SA (K-DUR) 20 MEQ tablet Take 1 tablet (20 mEq total) by mouth 2 (two) times daily. 09/03/18   Latorie Montesano, Barbette Hair, MD  predniSONE (STERAPRED UNI-PAK 21 TAB) 10 MG (21) TBPK tablet 6 day taper; take as directed on package instructions  07/16/20   Mar Daring, PA-C  torsemide (DEMADEX) 20 MG tablet Take 2 tablets (40 mg total) by mouth 2 (two) times daily. Patient taking differently: Take 120 mg by mouth 2 (two) times daily. 4 tabs am, 2 tabs pm 02/12/20 02/06/21  Werner Lean, MD    Allergies    Patient has no known allergies.  Review of Systems   Review of Systems  Constitutional:  Negative for fever.  Respiratory:  Positive for shortness of breath. Negative for cough.   Cardiovascular:  Positive for leg swelling. Negative for chest pain.  All other systems reviewed and are negative.  Physical Exam Updated Vital Signs BP (!) 168/115   Pulse 81   Temp 98.2 F (36.8 C) (Oral)   Resp 20   Ht 1.727 m (5\' 8" )   Wt (!) 167.8 kg   SpO2 93%   BMI 56.26 kg/m   Physical Exam Vitals and nursing note reviewed.  Constitutional:      Appearance: He is well-developed. He is obese. He is not ill-appearing.  HENT:     Head: Normocephalic and atraumatic.     Mouth/Throat:     Mouth: Mucous membranes are moist.  Eyes:     Pupils: Pupils are equal, round, and reactive to light.  Cardiovascular:     Rate and Rhythm: Normal rate and regular rhythm.     Heart sounds: Normal heart sounds. No murmur heard. Pulmonary:     Effort: Pulmonary effort  is normal. No respiratory distress.     Breath sounds: Normal breath sounds. No wheezing.  Abdominal:     General: Bowel sounds are normal.     Palpations: Abdomen is soft.     Tenderness: There is no abdominal tenderness. There is no rebound.  Musculoskeletal:     Cervical back: Neck supple.     Right lower leg: Edema present.     Left lower leg: Edema present.     Comments: 1+ bilateral lower extremity edema  Lymphadenopathy:     Cervical: No cervical adenopathy.  Skin:    General: Skin is warm and dry.  Neurological:     Mental Status: He is alert and oriented to person, place, and time.  Psychiatric:        Mood and Affect: Mood normal.    ED Results / Procedures / Treatments   Labs (all labs ordered are listed, but only abnormal results are displayed) Labs Reviewed  CBC WITH DIFFERENTIAL/PLATELET - Abnormal; Notable for the following components:      Result Value   Hemoglobin 12.0 (*)    MCV 71.5 (*)    MCH 21.6 (*)    RDW 21.2 (*)    All other components within normal limits  BASIC METABOLIC PANEL - Abnormal; Notable for the following components:   Glucose, Bld 103 (*)    BUN 53 (*)    Creatinine, Ser 5.80 (*)    Calcium 8.4 (*)    GFR, Estimated 12 (*)    All other components within normal limits  BRAIN NATRIURETIC PEPTIDE - Abnormal; Notable for the following components:   B Natriuretic Peptide 315.8 (*)    All other components within normal limits    EKG EKG Interpretation  Date/Time:  Tuesday August 10 2020 05:48:46 EDT Ventricular Rate:  88 PR Interval:  197 QRS Duration: 116 QT Interval:  382 QTC Calculation: 463 R Axis:   45 Text Interpretation: Sinus rhythm Multiform ventricular premature complexes LVH with IVCD and  secondary repol abnrm ST depr, consider ischemia, inferior leads Anterior ST elevation, probably due to LVH Premature ventricular complexes now presence Confirmed by Thayer Jew [phone]) on 08/10/2020 5:51:17 AM  Radiology DG Chest 2  View  Result Date: 08/10/2020 CLINICAL DATA:  Lower extremities swelling. EXAM: CHEST - 2 VIEW COMPARISON:  Chest x-ray 04/30/2019 FINDINGS: Heart is borderline enlarged for age but appears relatively stable. There is moderate central vascular congestion, peribronchial thickening and increased interstitial markings suggesting CHF. No pleural effusions or pulmonary infiltrates. IMPRESSION: Findings suggest mild CHF. Electronically Signed   By: Marijo Sanes M.D.   On: 08/10/2020 06:50    Procedures .Critical Care  Date/Time: 08/10/2020 7:02 AM Performed by: Merryl Hacker, MD Authorized by: Merryl Hacker, MD   Critical care provider statement:    Critical care time (minutes):  45   Critical care was necessary to treat or prevent imminent or life-threatening deterioration of the following conditions:  Cardiac failure   Critical care was time spent personally by me on the following activities:  Discussions with consultants, evaluation of patient's response to treatment, examination of patient, ordering and performing treatments and interventions, ordering and review of laboratory studies, ordering and review of radiographic studies, pulse oximetry, re-evaluation of patient's condition, obtaining history from patient or surrogate and review of old charts   Medications Ordered in ED Medications  furosemide (LASIX) injection 80 mg (has no administration in time range)    ED Course  I have reviewed the triage vital signs and the nursing notes.  Pertinent labs & imaging results that were available during my care of the patient were reviewed by me and considered in my medical decision making (see chart for details).  Clinical Course as of 08/10/20 3244  Tue Aug 10, 2020  0659 Patient noted to be hypoxic with sleeping.  At baseline wears CPAP.  States he got home after midnight and did not sleep much.  Labs reviewed.  Creatinine 5.8.  Most recent baseline is closer to 5.  BNP is elevated at  315 when compared to prior this is elevated.  Chest x-ray is concerning for mild CHF.  When patient initially presented, O2 sats were 93 to 95%.  He only desats with sleeping.  This is likely related to apnea.  He reports that he has a appointment with his nephrologist tomorrow.  His preference would be to try to go home if at all possible.  He is on 120 mg of torsemide daily.  Given his kidney function, he will likely require high-dose of diuretic to diurese appropriately; however, this increases his risk for worsening kidney function.  Patient given 80 mg IV Lasix.  Will monitor closely.  If he remains stable and does not desat, feel it is reasonable for him to go home with very close follow-up with his nephrologist for adjustment of his diuretic and monitoring of his kidney function. [CH]    Clinical Course User Index [CH] Rober Skeels, Barbette Hair, MD   MDM Rules/Calculators/A&P                          Patient presents with lower extremity swelling.  History of renal dysfunction and HF.  He is nontoxic.  Initial O2 sats 93 to 94% on room air.  He is in no respiratory distress.  Does describe symptoms likely suggestive of orthopnea.  He appears mildly volume overloaded on exam.  Labs obtained.  EKG without acute ischemic or arrhythmic  changes.  Chest x-ray does show some changes consistent with mild heart failure.  This was independently reviewed by myself.  Additionally, elevated BNP.  Creatinine increased from March from around baseline of 5-5.8.  He is also on a significant amount of torsemide at home.  Patient's preference is to try to be treated as an outpatient.  He was noted to desat when sleeping but has a history of obstructive sleep apnea.  Patient was given 80 of IV Lasix.  Monitor closely.  Patient signed out to oncoming provider.  Final Clinical Impression(s) / ED Diagnoses Final diagnoses:  Acute systolic congestive heart failure (HCC)  Lower extremity edema  Acute renal failure superimposed  on stage 4 chronic kidney disease, unspecified acute renal failure type [facility])    Rx / DC Orders ED Discharge Orders     None        Merryl Hacker, MD 08/10/20 [phone]

## 2020-08-10 NOTE — ED Notes (Signed)
Patient has taken himself off of cardiac monitor, oxygen, pulse ox, and BP, sitting in chair at bedside.  Patient stated he wanted to sit up in chair for a few minutes, advised patient would need to recheck vital signs ~ 0900.

## 2020-08-10 NOTE — Discharge Instructions (Addendum)
1.  See your nephrologist tomorrow as planned.  It is very important you keep all follow-up appointments with your primary providers.  You have multiple medical problems that need close supervision. 2.  Take all of your medications as prescribed. 3.  Return to the emergency department if you develop shortness of breath, chest pain, passing out or lightheadedness or other concerning symptoms.

## 2020-08-10 NOTE — Progress Notes (Signed)
Patient ambulated with pulse ox. SAT 94% entire time. MD aware

## 2020-08-10 NOTE — ED Notes (Signed)
Patient took himself ff BiPAP. Placed on 2L. RT to monitor,

## 2020-08-10 NOTE — ED Provider Notes (Signed)
Patient presented for lower extremity swelling.  Frank Coffey has chronic renal insufficiency.  Frank Coffey was to reassess for anticipated discharge if improved and maintaining oxygen saturations.  Patient is alert in no distress.  Clear mental status.  No increased work of breathing.  Patient was ambulated and ambulated well without dyspnea or desaturation.  Patient's blood pressure however remains very elevated.  Recheck shows 183/130.  Patient is on multiple antihypertensives.  Will administer patient's Norvasc and Coreg orally.  We will give an IV dose of hydralazine.  Frank Coffey does not have signs of hypertensive emergency but will need his a.m. medications and assessment for some response to antihypertensives. Physical Exam  BP (!) 183/130   Pulse 88   Temp 98.2 F (36.8 C) (Oral)   Resp 16   Ht 5\' 8"  (1.727 m)   Wt (!) 167.8 kg   SpO2 95%   BMI 56.26 kg/m   Physical Exam Constitutional:      Appearance: Normal appearance.  Pulmonary:     Effort: Pulmonary effort is normal.  Neurological:     General: No focal deficit present.     Mental Status: Frank Coffey is alert and oriented to person, place, and time.     Coordination: Coordination normal.    ED Course/Procedures   Clinical Course as of 08/10/20 0913  Tue Aug 10, 2020  0659 Patient noted to be hypoxic with sleeping.  At baseline wears CPAP.  States Frank Coffey got home after midnight and did not sleep much.  Labs reviewed.  Creatinine 5.8.  Most recent baseline is closer to 5.  BNP is elevated at 315 when compared to prior this is elevated.  Chest x-ray is concerning for mild CHF.  When patient initially presented, O2 sats were 93 to 95%.  Frank Coffey only desats with sleeping.  This is likely related to apnea.  Frank Coffey reports that Frank Coffey has a appointment with his nephrologist tomorrow.  His preference would be to try to go home if at all possible.  Frank Coffey is on 120 mg of torsemide daily.  Given his kidney function, Frank Coffey will likely require high-dose of diuretic to diurese appropriately;  however, this increases his risk for worsening kidney function.  Patient given 80 mg IV Lasix.  Will monitor closely.  If Frank Coffey remains stable and does not desat, feel it is reasonable for him to go home with very close follow-up with his nephrologist for adjustment of his diuretic and monitoring of his kidney function. [CH]    Clinical Course User Index [CH] Horton, Barbette Hair, MD    Procedures  MDM   Patient is much improved.  Frank Coffey has been given his a.m. medications and 1 IV dose of hydralazine.  His normal hydralazine dose is 100 mg p.o.  Frank Coffey has ambulated without difficulty.  No dyspnea on exertion and clear mental status.  Patient has follow-up with nephrology tomorrow.  Stable for discharge at this time with careful return precautions.      Charlesetta Shanks, MD 08/10/20 [phone]

## 2020-08-10 NOTE — ED Triage Notes (Signed)
Pt states feet started welling last night. Pt states he elevated them and took fluid pill without improvement.

## 2020-08-11 ENCOUNTER — Telehealth: Payer: 59 | Admitting: Physician Assistant

## 2020-08-11 DIAGNOSIS — I509 Heart failure, unspecified: Secondary | ICD-10-CM

## 2020-08-11 DIAGNOSIS — Z20822 Contact with and (suspected) exposure to covid-19: Secondary | ICD-10-CM

## 2020-08-11 NOTE — Progress Notes (Signed)
Based on what you shared with me, I feel your condition warrants further evaluation and I recommend that you be seen in a face to face visit.  You were just in the ER yesterday with signs of exacerbation of heart failure on x-ray which can be contributing to the respiratory symptoms you are having. You need to be evaluated by your primary care (video or in-person) or consider being evaluated at a local urgent care for further management. Unfortunately this is above the scope of what is safe to manage via e-visit or video urgent visit through your MyChart.    NOTE: There will be NO CHARGE for this eVisit   If you are having a true medical emergency please call 911.      For an urgent face to face visit, [place] has six urgent care centers for your convenience:     [facility] [place] [facility] [place] Get Driving Directions [phone] [place], [place] [address]    [facility]) Get Driving Directions [phone] [place], [place] [address]  [facility] ([place]) Get Driving Directions [phone] [address] [place],  [place]  [address]  [facility] Get Driving Directions [phone] [address] [place], [place], [place] [address]   [facility] Get Driving Directions  [phone] [address], [place] [address]   [facility] [place] Get Driving Directions [phone] [address], [place], [place] [address]  Your MyChart E-visit questionnaire answers were reviewed by a board certified advanced clinical practitioner to complete your personal care plan based on your specific symptoms.  Thank you for using e-Visits.

## 2020-08-13 ENCOUNTER — Inpatient Hospital Stay: Payer: 59 | Admitting: Nurse Practitioner

## 2020-08-30 ENCOUNTER — Telehealth: Payer: Self-pay | Admitting: Internal Medicine

## 2020-08-30 NOTE — Telephone Encounter (Signed)
Reviewed Nephrology Notes - Patient is attending RRT classes and may need HD in the near future.  No change to plan.

## 2020-09-18 ENCOUNTER — Other Ambulatory Visit: Payer: Self-pay | Admitting: Nurse Practitioner

## 2020-09-18 DIAGNOSIS — K5909 Other constipation: Secondary | ICD-10-CM

## 2020-09-21 NOTE — Telephone Encounter (Signed)
Patient states he has been out of this medication for a week and has been calling his pharmacy. He complains of painful constipation and will go to ED if he cannot get this refilled today. Please call patient regarding refill and advice.

## 2020-09-22 ENCOUNTER — Other Ambulatory Visit: Payer: Self-pay | Admitting: Nurse Practitioner

## 2020-09-22 DIAGNOSIS — K5909 Other constipation: Secondary | ICD-10-CM

## 2020-09-27 ENCOUNTER — Telehealth: Payer: Self-pay

## 2020-10-01 NOTE — Telephone Encounter (Signed)
Pt c/o of medication Linzess 145mcg capsules  not resolving issue.

## 2020-10-11 NOTE — Telephone Encounter (Signed)
Pt states he just noticed there is no refills on the medication and he would like to know why. Pt informed he would have to schedule a follow up appointment per the note on the medication.  Appt scheduled.

## 2020-10-14 ENCOUNTER — Other Ambulatory Visit: Payer: Self-pay

## 2020-10-14 ENCOUNTER — Ambulatory Visit (INDEPENDENT_AMBULATORY_CARE_PROVIDER_SITE_OTHER): Payer: 59 | Admitting: Nurse Practitioner

## 2020-10-14 ENCOUNTER — Encounter: Payer: Self-pay | Admitting: Nurse Practitioner

## 2020-10-14 VITALS — BP 132/82 | HR 80 | Temp 97.2°F | Ht 69.0 in | Wt 378.6 lb

## 2020-10-14 DIAGNOSIS — F5102 Adjustment insomnia: Secondary | ICD-10-CM

## 2020-10-14 DIAGNOSIS — K5904 Chronic idiopathic constipation: Secondary | ICD-10-CM | POA: Diagnosis not present

## 2020-10-14 DIAGNOSIS — Z23 Encounter for immunization: Secondary | ICD-10-CM

## 2020-10-14 MED ORDER — LINACLOTIDE 145 MCG PO CAPS: 145.0000 ug | ORAL_CAPSULE | Freq: Every day | ORAL | 3 refills | Status: DC

## 2020-10-14 NOTE — Progress Notes (Signed)
Subjective:  Patient ID: Frank Coffey, male    DOB: 1980-08-28  Age: 40 y.o. MRN: [id number]  CC: Follow-up (Pt in needs of refill for Linzess and states he has been having a lot of sleeping issues lately. Pt states he did not sleep for 3 days and states he has been very stressed and tired. )   Insomnia Primary symptoms: fragmented sleep, sleep disturbance, difficulty falling asleep, frequent awakening, malaise/fatigue.   The current episode started 1 to 4 weeks ago. The onset quality is gradual. The problem occurs intermittently. The problem is unchanged. The symptoms are aggravated by work stress. How many drinks containing alcohol do you have on a typical day when you are drinking: none.  The symptoms are relieved by relaxation. Past treatments include nothing. Typical bedtime:  8-10 P.M..  How long after going to bed to you fall asleep: over an hour.   PMH includes: hypertension, no depression, no family stress or anxiety, no restless leg syndrome, work related stressors, no chronic pain, apnea.  Prior diagnostic workup includes:  Polysomnogram and blood work.  Reports he is compliant with CPAP machine, but also needs to schedule f/up appt with Dr. Elsworth Soho.  Chronic idiopathic constipation Improved with linzess. Colonoscopy completed 01/2020: polyp removed (Tubular adenoma. Negative for high grade dysplasia.) Refill sent Wt Readings from Last 3 Encounters:  10/14/20 (!) 378 lb 9.6 oz (171.7 kg)  08/10/20 (!) 370 lb (167.8 kg)  07/04/20 (!) 374 lb (169.6 kg)     Reviewed past Medical, Social and Family history today.  Outpatient Medications Prior to Visit  Medication Sig Dispense Refill   allopurinol (ZYLOPRIM) 300 MG tablet TAKE 1 TABLET(300 MG) BY MOUTH DAILY 90 tablet 0   amLODipine (NORVASC) 5 MG tablet Take 5 mg by mouth daily.     atorvastatin (LIPITOR) 20 MG tablet Take 1 tablet (20 mg total) by mouth daily. 90 tablet 3   brimonidine (ALPHAGAN) 0.2 % ophthalmic solution Place 1  drop into both eyes 2 (two) times daily.     carvedilol (COREG) 25 MG tablet Take 2 tablets (50 mg total) by mouth 2 (two) times daily. 360 tablet 3   dorzolamide-timolol (COSOPT) 22.3-6.8 MG/ML ophthalmic solution Place 1 drop into both eyes 2 (two) times daily.     EQ FIBER SUPPLEMENT PO Take 2 tablets by mouth daily. colon cleanse     hydrALAZINE (APRESOLINE) 100 MG tablet Take 1 tablet (100 mg total) by mouth 3 (three) times daily. 270 tablet 3   latanoprost (XALATAN) 0.005 % ophthalmic solution Place 1 drop into the left eye at bedtime.     omeprazole (PRILOSEC) 40 MG capsule Take 1 capsule (40 mg total) by mouth daily. 30 capsule 6   potassium chloride SA (K-DUR) 20 MEQ tablet Take 1 tablet (20 mEq total) by mouth 2 (two) times daily. 10 tablet 0   torsemide (DEMADEX) 20 MG tablet Take 2 tablets (40 mg total) by mouth 2 (two) times daily. (Patient taking differently: Take 120 mg by mouth 2 (two) times daily. 4 tabs am, 2 tabs pm) 360 tablet 3   linaclotide (LINZESS) 145 MCG CAPS capsule Take 1 capsule (145 mcg total) by mouth daily before breakfast. **Needs appointment before next refill given** 30 capsule 0   benzonatate (TESSALON) 100 MG capsule Take 1 capsule (100 mg total) by mouth 3 (three) times daily as needed. (Patient not taking: Reported on 10/14/2020) 30 capsule 0   predniSONE (STERAPRED UNI-PAK 21 TAB) 10 MG (21)  TBPK tablet 6 day taper; take as directed on package instructions (Patient not taking: Reported on 10/14/2020) 21 tablet 0   No facility-administered medications prior to visit.    ROS See HPI  Objective:  BP 132/82 (BP Location: Left Arm, Patient Position: Sitting, Cuff Size: Large)   Pulse 80   Temp (!) 97.2 F (36.2 C) (Temporal)   Ht 5\' 9"  (1.753 m)   Wt (!) 378 lb 9.6 oz (171.7 kg)   SpO2 95%   BMI 55.91 kg/m   Physical Exam Constitutional:      Appearance: He is obese.  Cardiovascular:     Rate and Rhythm: Normal rate.     Pulses: Normal pulses.   Pulmonary:     Effort: Pulmonary effort is normal.  Musculoskeletal:     Right lower leg: Edema present.     Left lower leg: Edema present.  Neurological:     Mental Status: He is alert and oriented to person, place, and time.  Psychiatric:        Mood and Affect: Mood normal.        Behavior: Behavior normal.        Thought Content: Thought content normal.    Assessment & Plan:  This visit occurred during the SARS-CoV-2 public health emergency.  Safety protocols were in place, including screening questions prior to the visit, additional usage of staff PPE, and extensive cleaning of exam room while observing appropriate contact time as indicated for disinfecting solutions.   Dionte was seen today for follow-up.  Diagnoses and all orders for this visit:  Chronic idiopathic constipation -     linaclotide (LINZESS) 145 MCG CAPS capsule; Take 1 capsule (145 mcg total) by mouth daily before breakfast. **Needs appointment before next refill given**  Flu vaccine need -     Flu Vaccine QUAD 6+ mos PF IM (Fluarix Quad PF)  Need for diphtheria-tetanus-pertussis (Tdap) vaccine -     Tdap vaccine greater than or equal to 7yo IM  CKD (chronic kidney disease) stage 4, GFR 15-29 ml/min (HCC)  Adjustment insomnia Schedule f/up appt with cardiology and nephrology. Ok to use melatonin 1-5mg  or sleepy time tea at bedtime for sleep. Only use if you have at least 8hrs to sleep. Maintain compliance with your CPAP machine.  Problem List Items Addressed This Visit       Digestive   Chronic idiopathic constipation - Primary    Improved with linzess. Colonoscopy completed 01/2020: polyp removed (Tubular adenoma. 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  [name], NP

## 2020-10-14 NOTE — Patient Instructions (Addendum)
Schedule f/up appt with cardiology and nephrology.  Ok to use melatonin 1-5mg  or sleepy time tea at bedtime for sleep. Only use if you have at least 8hrs to sleep.  Maintain compliance with your CPAP machine.

## 2020-10-14 NOTE — Assessment & Plan Note (Addendum)
Improved with linzess. Colonoscopy completed 01/2020: polyp removed (Tubular adenoma. Negative for high grade dysplasia.) Refill sent

## 2020-10-17 ENCOUNTER — Encounter: Payer: Self-pay | Admitting: Nurse Practitioner

## 2020-10-17 DIAGNOSIS — K5904 Chronic idiopathic constipation: Secondary | ICD-10-CM

## 2020-10-17 DIAGNOSIS — N184 Chronic kidney disease, stage 4 (severe): Secondary | ICD-10-CM

## 2020-10-17 DIAGNOSIS — I129 Hypertensive chronic kidney disease with stage 1 through stage 4 chronic kidney disease, or unspecified chronic kidney disease: Secondary | ICD-10-CM

## 2020-10-18 ENCOUNTER — Other Ambulatory Visit: Payer: Self-pay | Admitting: Nurse Practitioner

## 2020-10-18 DIAGNOSIS — K5904 Chronic idiopathic constipation: Secondary | ICD-10-CM

## 2020-10-18 DIAGNOSIS — I129 Hypertensive chronic kidney disease with stage 1 through stage 4 chronic kidney disease, or unspecified chronic kidney disease: Secondary | ICD-10-CM

## 2020-10-19 ENCOUNTER — Telehealth: Payer: Self-pay | Admitting: Nurse Practitioner

## 2020-10-19 ENCOUNTER — Other Ambulatory Visit: Payer: Self-pay | Admitting: Nurse Practitioner

## 2020-10-19 DIAGNOSIS — K5904 Chronic idiopathic constipation: Secondary | ICD-10-CM

## 2020-10-19 NOTE — Telephone Encounter (Signed)
Patient is calling to check on his refill. He sent a message yesterday. Lizness

## 2020-10-20 MED ORDER — LINACLOTIDE 145 MCG PO CAPS
145.0000 ug | ORAL_CAPSULE | Freq: Every day | ORAL | 3 refills | Status: DC
Start: 2020-10-20 — End: 2020-10-22

## 2020-10-20 MED ORDER — HYDRALAZINE HCL 100 MG PO TABS: 100.0000 mg | ORAL_TABLET | Freq: Three times a day (TID) | ORAL | 3 refills | Status: DC

## 2020-10-20 NOTE — Telephone Encounter (Signed)
Called and spoke to patient. Per patient request for Linzess and torsemide already refilled. Patient requested refill for Hydralazine, refill request approved. Sw, cma

## 2020-10-20 NOTE — Addendum Note (Signed)
Addended by: Marchia Bond on: 10/20/2020 10:46 AM   Modules accepted: Orders

## 2020-10-22 ENCOUNTER — Other Ambulatory Visit: Payer: Self-pay

## 2020-10-22 ENCOUNTER — Encounter: Payer: Self-pay | Admitting: Primary Care

## 2020-10-22 ENCOUNTER — Ambulatory Visit (INDEPENDENT_AMBULATORY_CARE_PROVIDER_SITE_OTHER): Payer: 59 | Admitting: Primary Care

## 2020-10-22 ENCOUNTER — Telehealth: Payer: Self-pay | Admitting: Nurse Practitioner

## 2020-10-22 VITALS — HR 78 | Temp 98.2°F | Ht 69.0 in | Wt 381.0 lb

## 2020-10-22 DIAGNOSIS — Z6841 Body Mass Index (BMI) 40.0 and over, adult: Secondary | ICD-10-CM | POA: Diagnosis not present

## 2020-10-22 DIAGNOSIS — Z8669 Personal history of other diseases of the nervous system and sense organs: Secondary | ICD-10-CM | POA: Diagnosis not present

## 2020-10-22 DIAGNOSIS — G4733 Obstructive sleep apnea (adult) (pediatric): Secondary | ICD-10-CM

## 2020-10-22 DIAGNOSIS — K5904 Chronic idiopathic constipation: Secondary | ICD-10-CM

## 2020-10-22 MED ORDER — LINACLOTIDE 145 MCG PO CAPS: 145.0000 ug | ORAL_CAPSULE | Freq: Every day | ORAL | 3 refills | Status: DC

## 2020-10-22 NOTE — Progress Notes (Signed)
@Patient  ID: Frank Coffey, male    DOB: 1980-09-08, 40 y.o.   MRN: [id number]  Chief Complaint  Patient presents with   Follow-up    Patient reports that he has been using an online machine.     Referring provider: Flossie Buffy, NP  HPI: 40 male, never smoked. PMH significant for OSA. Patient of Dr. Elsworth Soho, last seen on 11/27/2017. HST 12/12/2016 >> AHI 64.1/hour with SpO2 low 58% (average 87%).   10/22/2020- Interim hx  Patient presents today for overdue OSA follow-up. He has symptoms of loud snoring, apnea and daytime fatigue. Not currently using CPAP, machine is no longer working. He gets 3-4 hours of sleep at night. 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 He sleeps better in a recliner. He will wake himself up at night when he stops breathing.  [place] 14.    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   History of kidney stones    Passed   Hx of migraine headaches    Hyperlipidemia    Hypertension    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)    Obesity    OSA (obstructive sleep apnea)    Pneumonia 04/2019   with Covid    Tobacco History: Social History   Tobacco Use  Smoking Status Never  Smokeless Tobacco Never   Counseling given: Not Answered   Outpatient Medications Prior to Visit  Medication Sig Dispense Refill    allopurinol (ZYLOPRIM) 300 MG tablet TAKE 1 TABLET(300 MG) BY MOUTH DAILY 90 tablet 0   amLODipine (NORVASC) 5 MG tablet Take 5 mg by mouth daily.     atorvastatin (LIPITOR) 20 MG tablet Take 1 tablet (20 mg total) by mouth daily. 90 tablet 3   brimonidine (ALPHAGAN) 0.2 % ophthalmic solution Place 1 drop into both eyes 2 (two) times daily.     carvedilol (COREG) 25 MG tablet Take 2 tablets (50 mg total) by mouth 2 (two) times daily. 360 tablet 3   dorzolamide-timolol (COSOPT) 22.3-6.8 MG/ML ophthalmic solution Place 1 drop into both eyes 2 (two) times daily.     EQ FIBER SUPPLEMENT PO Take 2 tablets by mouth daily. colon cleanse     hydrALAZINE (APRESOLINE) 100 MG tablet Take 1 tablet (100 mg total) by mouth 3 (three) times daily. 270 tablet 3   latanoprost (XALATAN) 0.005 % ophthalmic solution Place 1 drop into the left eye at bedtime.     linaclotide (LINZESS) 145 MCG CAPS capsule Take 1 capsule (145 mcg total) by mouth daily before breakfast. **Needs appointment before next refill given** 90 capsule 3   omeprazole (PRILOSEC) 40 MG capsule Take 1 capsule (40 mg total) by mouth daily. 30 capsule 6   potassium chloride SA (K-DUR) 20 MEQ tablet Take 1 tablet (20 mEq total) by mouth 2 (two) times daily. 10 tablet 0   torsemide (DEMADEX)  20 MG tablet Take 2 tablets (40 mg total) by mouth 2 (two) times daily. (Patient taking differently: Take 120 mg by mouth 2 (two) times daily. 4 tabs am, 2 tabs pm) 360 tablet 3   No facility-administered medications prior to visit.    Review of Systems  Review of Systems  Constitutional:  Positive for fatigue.  HENT: Negative.    Respiratory: Negative.    Cardiovascular: Negative.   Psychiatric/Behavioral:  Positive for sleep disturbance.     Physical Exam  Pulse 78   Temp 98.2 F (36.8 C) (Oral)   Ht 5\' 9"  (1.753 m)   Wt (!) 381 lb (172.8 kg)   SpO2 95%   BMI 56.26 kg/m  Physical Exam Constitutional:      Appearance: Normal appearance.  HENT:      Head: Normocephalic and atraumatic.     Mouth/Throat:     Comments: Mallampati class III Cardiovascular:     Rate and Rhythm: Normal rate and regular rhythm.  Pulmonary:     Effort: Pulmonary effort is normal.     Breath sounds: Normal breath sounds.  Neurological:     General: No focal deficit present.     Mental Status: He is alert and oriented to person, place, and time. Mental status is at baseline.  Psychiatric:        Mood and Affect: Mood normal.        Behavior: Behavior normal.        Thought Content: Thought content normal.        Judgment: Judgment normal.     Lab Results:  CBC    Component Value Date/Time   WBC 9.5 08/10/2020 0605   RBC 5.55 08/10/2020 0605   HGB 12.0 (L) 08/10/2020 0605   HCT 39.7 08/10/2020 0605   PLT 288 08/10/2020 0605   MCV 71.5 (L) 08/10/2020 0605   MCH 21.6 (L) 08/10/2020 0605   MCHC 30.2 08/10/2020 0605   RDW 21.2 (H) 08/10/2020 0605   LYMPHSABS 1.0 08/10/2020 0605   MONOABS 0.8 08/10/2020 0605   EOSABS 0.3 08/10/2020 0605   BASOSABS 0.0 08/10/2020 0605    BMET    Component Value Date/Time   NA 139 08/10/2020 0605   NA 140 04/05/2020 0000   K 3.5 08/10/2020 0605   CL 102 08/10/2020 0605   CO2 28 08/10/2020 0605   GLUCOSE 103 (H) 08/10/2020 0605   BUN 53 (H) 08/10/2020 0605   BUN 51 (A) 04/05/2020 0000   CREATININE 5.80 (H) 08/10/2020 0605   CREATININE 3.00 (H) 12/01/2014 1003   CALCIUM 8.4 (L) 08/10/2020 0605   GFRNONAA 12 (L) 08/10/2020 0605   GFRNONAA 24 (L) 11/16/2014 1656   GFRAA 17 (L) 07/15/2019 1738   GFRAA 28 (L) 11/16/2014 1656    BNP    Component Value Date/Time   BNP 315.8 (H) 08/10/2020 0604   BNP 102.2 (H) 11/16/2014 1656    ProBNP    Component Value Date/Time   PROBNP 821 (H) 02/12/2020 0955   PROBNP 1,619.0 (H) 11/07/2013 4270    Imaging: No results found.   Assessment & Plan:   OSA (obstructive sleep apnea) - Hx severe OSA, AHI 64/hr with severe O2 desaturations. No longer using  CPAP. Patient has symptoms of loud snoring, apnea and daytime sleepiness. [place] 14. Needs split night sleep study. Encourage weight loss. Recommend he sleep in recliner as much as possible. Advised against driving if experiencing excessive daytime sleepiness. We will follow-up after sleep study and  order CPAP if needed. FU in 4-6 months in office.   Morbid obesity ([place]) - Refer to medial weight management    Martyn Ehrich, NP 10/22/2020

## 2020-10-22 NOTE — Patient Instructions (Addendum)
Recommendations: Sleep in recliner as much as possible Aim to get 6 hours of sleep each night Work on weight loss Do not drive if experiencing excessive daytime sleepiness/fatigue   Orders: Split night sleep study (ordered)  Refer: [facility] weight loss center   Follow-up: 4-6 months with Dr. Elsworth Soho

## 2020-10-22 NOTE — Assessment & Plan Note (Signed)
-   Refer to medial weight management

## 2020-10-22 NOTE — Telephone Encounter (Signed)
Spoke to pharmacy and they were out of Linzess, RX sent to the [place]/[address] location.  Patient notified [place] phone. Dm/cma

## 2020-10-22 NOTE — Assessment & Plan Note (Signed)
-   Hx severe OSA, AHI 64/hr with severe O2 desaturations. No longer using CPAP. Patient has symptoms of loud snoring, apnea and daytime sleepiness. [place] 14. Needs split night sleep study. Encourage weight loss. Recommend he sleep in recliner as much as possible. Advised against driving if experiencing excessive daytime sleepiness. We will follow-up after sleep study and order CPAP if needed. FU in 4-6 months in office.

## 2020-11-10 DIAGNOSIS — H4052X3 Glaucoma secondary to other eye disorders, left eye, severe stage: Secondary | ICD-10-CM | POA: Insufficient documentation

## 2020-11-18 NOTE — Progress Notes (Signed)
Cardiology Office Note:    Date:  11/19/2020   ID:  Frank Coffey, DOB [birth year], MRN [id number]  PCP:  Flossie Buffy, NP  [facility] Cardiologist:  Werner Lean, MD  [facility] Electrophysiologist:  None   CC: HF F/U  History of Present Illness:    Frank Coffey is a 40 y.o. 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  In interim of this visit, patient had borderline dilation of his thoracic aorta with stable EF.  Has started classes for HD.  Seen 11/19/20.  Patient notes that he is going to the gym and has increased his bench press.  After he maxed out on Tuesday he had residual arm soreness and chest soreness.  Doesn't have pains around weigh lifting.    No SOB/DOE and no PND/Orthopnea.  No weight gain or leg swelling.  No palpitations or syncope .  Ambulatory blood pressure not done.  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 ENDOSCOPY;  Service: Gastroenterology;;   CHOLECYSTECTOMY     COLONOSCOPY WITH PROPOFOL N/A 02/09/2020   Procedure: COLONOSCOPY WITH PROPOFOL;  Surgeon: Irving Copas., MD;  Location: [facility] ENDOSCOPY;  Service: Gastroenterology;   Laterality: N/A;   ESOPHAGOGASTRODUODENOSCOPY (EGD) WITH PROPOFOL N/A 02/09/2020   Procedure: ESOPHAGOGASTRODUODENOSCOPY (EGD) WITH PROPOFOL;  Surgeon: Rush Landmark Telford Nab., MD;  Location: [place];  Service: Gastroenterology;  Laterality: N/A;   POLYPECTOMY  02/09/2020   Procedure: POLYPECTOMY;  Surgeon: Irving Copas., MD;  Location: [facility] ENDOSCOPY;  Service: Gastroenterology;;   RETINAL DETACHMENT REPAIR W/ SCLERAL BUCKLE LE     Left    Current Medications: Current Meds  Medication Sig   allopurinol (ZYLOPRIM) 300 MG tablet TAKE 1 TABLET(300 MG) BY MOUTH DAILY   amLODipine (NORVASC) 5 MG tablet Take 5 mg by mouth daily.   atorvastatin (LIPITOR) 20 MG tablet Take 1 tablet (20 mg total) by mouth daily.   brimonidine (ALPHAGAN) 0.2 % ophthalmic solution Place 1 drop into both eyes 2 (two) times daily.   carvedilol (COREG) 25 MG tablet Take 2 tablets (50 mg total) by mouth 2 (two) times daily.   dorzolamide-timolol (COSOPT) 22.3-6.8 MG/ML ophthalmic solution Place 1 drop into both eyes 2 (two) times daily.   EQ FIBER SUPPLEMENT PO Take 2 tablets by mouth daily. colon cleanse   hydrALAZINE (APRESOLINE) 100 MG tablet Take 1 tablet (100 mg total) by mouth 3 (three) times daily.   isosorbide mononitrate (IMDUR) 30 MG 24 hr tablet Take 1 tablet (30 mg total) by mouth daily.  latanoprost (XALATAN) 0.005 % ophthalmic solution Place 1 drop into the left eye at bedtime.   linaclotide (LINZESS) 145 MCG CAPS capsule Take 1 capsule (145 mcg total) by mouth daily before breakfast. **Needs appointment before next refill given**   omeprazole (PRILOSEC) 40 MG capsule Take 1 capsule (40 mg total) by mouth daily.   Potassium Chloride ER 20 MEQ TBCR Take 1 tablet by mouth daily.   torsemide (DEMADEX) 20 MG tablet Take 2 tablets (40 mg total) by mouth 2 (two) times daily. (Patient taking differently: Take 120 mg by mouth 2 (two) times daily. 4 tabs (80mg ) am, 2 tabs (40mg ) pm)     Allergies:    Patient has no known allergies.   Social History   Socioeconomic History   Marital status: Divorced    Spouse name: Not on file   Number of children: Not on file   Years of education: Not on file   Highest education level: Not on file  Occupational History   Not on file  Tobacco Use   Smoking status: Never   Smokeless tobacco: Never  Vaping Use   Vaping Use: Never used  Substance and Sexual Activity   Alcohol use: Not Currently    Comment: occ   Drug use: No   Sexual activity: Not on file  Other Topics Concern   Not on file  Social History Narrative   Epworth Sleepiness Scale = 10 (as of 12/01/14)   Social Determinants of Health   Financial Resource Strain: Not on file  Food Insecurity: Not on file  Transportation Needs: Not on file  Physical Activity: Not on file  Stress: Not on file  Social Connections: Not on file    Social: former semi-pro football player  Family History: The patient's family history includes Alzheimer's disease in his maternal grandfather; Cancer - Other in his paternal grandmother; Healthy in his brother, daughter, sister, and son; Heart disease in his maternal grandfather, maternal grandmother, and mother; Hyperlipidemia in his maternal grandfather, maternal grandmother, and mother; Hypertension in his maternal grandfather, maternal grandmother, and mother; Kidney disease in his mother; Stomach cancer in his father; Stroke in his maternal grandfather and maternal grandmother. There is no history of Diabetes, Heart attack, Sudden death, Colon cancer, Esophageal cancer, Pancreatic cancer, Inflammatory bowel disease, Liver disease, or Rectal cancer. Notes that his mother died of NCIM NOS and does not want to end up like her.  Notes that he was her POA and has records confirmed his previous records request.  ROS:   Please see the history of present illness.     All other systems reviewed and are negative.  EKGs/Labs/Other Studies Reviewed:    The  following studies were reviewed today:  EKG:   02/12/2020: SR 1st HB LVH with 2nd repolarization 05/01/19: SR rate 62 with LVH  Cardiac Event Monitoring: Date: 11/28/2017 Results: Quality: Fair.  Baseline artifact. Predominant rhythm: sinus rhythm Average heart rate: 81 bpm Max heart rate: 114 bpm Min heart rate: 49 bpm  One episode of second degree heart block, Mobitz type I No other arrhythmias noted.     Transthoracic Echocardiogram: Date: 11/14/2017 Results:  1. Left ventricular ejection fraction, by estimation, is 35%. The left  ventricle has moderately decreased function. The left ventricle  demonstrates global hypokinesis. The left ventricular internal cavity size  was moderately dilated. There is mild left  ventricular hypertrophy. Left ventricular diastolic parameters are  consistent with Grade II diastolic dysfunction (pseudonormalization).   2. Right ventricular systolic  function is normal. The right ventricular  size is normal. Tricuspid regurgitation signal is inadequate for assessing  PA pressure.   3. Left atrial size was moderately dilated.   4. Right atrial size was mildly dilated.   5. The mitral valve is normal in structure. No evidence of mitral valve  regurgitation. No evidence of mitral stenosis.   6. The aortic valve is tricuspid. Aortic valve regurgitation is not  visualized. No aortic stenosis is present.   7. Aortic dilatation noted. There is mild dilatation of the aortic root,  measuring 37 mm.   8. The inferior vena cava is normal in size with greater than 50%  respiratory variability, suggesting right atrial pressure of 3 mmHg   Recent Labs: 12/04/2019: TSH 3.34 02/12/2020: NT-Pro BNP 821 08/10/2020: B Natriuretic Peptide 315.8; BUN 53; Creatinine, Ser 5.80; Hemoglobin 12.0; Platelets 288; Potassium 3.5; Sodium 139  Recent Lipid Panel    Component Value Date/Time   CHOL 190 05/29/2018 0923   TRIG 121 04/30/2019 1339   HDL 32.80 (L)  05/29/2018 0923   CHOLHDL 6 05/29/2018 0923   VLDL 20.8 05/29/2018 0923   LDLCALC 137 (H) 05/29/2018 0923    Risk Assessment/Calculations:    N/A  Physical Exam:    VS:  BP (!) 142/100   Pulse 88   Ht 5' 8.5" (1.74 m)   Wt (!) 376 lb (170.6 kg)   SpO2 97%   BMI 56.34 kg/m     Wt Readings from Last 3 Encounters:  11/19/20 (!) 376 lb (170.6 kg)  10/22/20 (!) 381 lb (172.8 kg)  10/14/20 (!) 378 lb 9.6 oz (171.7 kg)    GEN: Obese, well developed in no acute distress HEENT: Normal NECK: No JVD (challenging exam); No carotid bruits LYMPHATICS: No lymphadenopathy CARDIAC: RRR, no murmurs, rubs, gallops RESPIRATORY:  Clear to auscultation without rales, wheezing or rhonchi  ABDOMEN: Soft, non-tender, non-distended MUSCULOSKELETAL:  Non-pitting edema ; No deformity  SKIN: Warm and dry NEUROLOGIC:  Alert and oriented x 3 PSYCHIATRIC:  Normal affect   ASSESSMENT:    1. NICM (nonischemic cardiomyopathy) ([place])   2. Morbid obesity ([place])   3. Chronic systolic CHF (congestive heart failure) ([place])   4. OSA (obstructive sleep apnea)   5. CKD (chronic kidney disease) stage 4, GFR 15-29 ml/min (HCC)     PLAN:    In order of problems listed above:  Heart Failure reduced Ejection Fraction IDA CKD Stage IV - NYHA class II, Stage C, euvolemic, etiology unclear - Diuretic regimen: Continue torsemide 40 mg PO daily and 20 mg PO PM - Coreg 50 mg PO BID (he is > 85 kg) - ARNI/ARB/ACEi/aldactone/SGLT2i stopped in the setting of CKD IV - Continue hydralazine 100 mg TID  - Imdur 30 mg PO daily  - we have discussed dietary changes, exercise and pool related cardio, weight loss, and that long term renal transplant could be in his future  Essential Hypertension and morbid obesity - amb BP has been suggested at most visit  Hyperlipidemia (uspecified) -LDL goal less than 100 -continue current statin - gave education on dietary changes  Will Fax Notes to [place] for three-four months follow up unless new symptoms or abnormal test results warranting change in plan  Would be reasonable for  APP Follow up      Medication Adjustments/Labs and Tests Ordered: Current medicines are reviewed at length with the patient today.  Concerns regarding medicines are outlined above.  No  orders of the defined types were placed in this encounter.  Meds ordered this encounter  Medications   isosorbide mononitrate (IMDUR) 30 MG 24 hr tablet    Sig: Take 1 tablet (30 mg total) by mouth daily.    Dispense:  90 tablet    Refill:  3     Patient Instructions  Medication Instructions:  Your physician has recommended you make the following change in your medication:   START: Isosorbide 30mg  daily  *If you need a refill on your cardiac medications before your next appointment, please call your pharmacy*   Lab Work: None If you have labs (blood work) drawn today and your tests are completely normal, you will receive your results only by: [place] (if you have MyChart) OR A paper copy in the mail If you have any lab test that is abnormal or we need to change your treatment, we will call you to review the results.   Follow-Up: At [facility], you and your health needs are our priority.  As part of our continuing mission to provide you with exceptional heart care, we have created designated Provider Care Teams.  These Care Teams include your primary Cardiologist (physician) and Advanced Practice Providers (APPs -  Physician Assistants and Nurse Practitioners) who all work together to provide you with the care you need, when you need it.   Your next appointment:   3 month(s)  The format for your next appointment:   In Person  Provider:   Werner Lean, MD  Or APP if no availability on his schedule  :1}     Signed, Werner Lean, MD  11/19/2020 9:37 AM    [place]

## 2020-11-19 ENCOUNTER — Ambulatory Visit (INDEPENDENT_AMBULATORY_CARE_PROVIDER_SITE_OTHER): Payer: 59 | Admitting: Internal Medicine

## 2020-11-19 ENCOUNTER — Other Ambulatory Visit: Payer: Self-pay

## 2020-11-19 ENCOUNTER — Encounter: Payer: Self-pay | Admitting: Internal Medicine

## 2020-11-19 VITALS — BP 142/100 | HR 88 | Ht 68.5 in | Wt 376.0 lb

## 2020-11-19 DIAGNOSIS — I5022 Chronic systolic (congestive) heart failure: Secondary | ICD-10-CM

## 2020-11-19 DIAGNOSIS — G4733 Obstructive sleep apnea (adult) (pediatric): Secondary | ICD-10-CM

## 2020-11-19 DIAGNOSIS — I428 Other cardiomyopathies: Secondary | ICD-10-CM

## 2020-11-19 DIAGNOSIS — N184 Chronic kidney disease, stage 4 (severe): Secondary | ICD-10-CM

## 2020-11-19 MED ORDER — ISOSORBIDE MONONITRATE ER 30 MG PO TB24: 30.0000 mg | ORAL_TABLET | Freq: Every day | ORAL | 3 refills | Status: DC

## 2020-11-19 NOTE — Patient Instructions (Signed)
Medication Instructions:  Your physician has recommended you make the following change in your medication:   START: Isosorbide 30mg  daily  *If you need a refill on your cardiac medications before your next appointment, please call your pharmacy*   Lab Work: None If you have labs (blood work) drawn today and your tests are completely normal, you will receive your results only by: [place] (if you have MyChart) OR A paper copy in the mail If you have any lab test that is abnormal or we need to change your treatment, we will call you to review the results.   Follow-Up: At [facility], you and your health needs are our priority.  As part of our continuing mission to provide you with exceptional heart care, we have created designated Provider Care Teams.  These Care Teams include your primary Cardiologist (physician) and Advanced Practice Providers (APPs -  Physician Assistants and Nurse Practitioners) who all work together to provide you with the care you need, when you need it.   Your next appointment:   3 month(s)  The format for your next appointment:   In Person  Provider:   Werner Lean, MD  Or APP if no availability on his schedule  :1}

## 2020-12-10 ENCOUNTER — Ambulatory Visit (HOSPITAL_BASED_OUTPATIENT_CLINIC_OR_DEPARTMENT_OTHER): Payer: 59 | Attending: Primary Care | Admitting: Pulmonary Disease

## 2020-12-10 ENCOUNTER — Other Ambulatory Visit: Payer: Self-pay

## 2020-12-10 DIAGNOSIS — G4733 Obstructive sleep apnea (adult) (pediatric): Secondary | ICD-10-CM | POA: Diagnosis not present

## 2020-12-10 DIAGNOSIS — R0683 Snoring: Secondary | ICD-10-CM | POA: Diagnosis not present

## 2020-12-10 DIAGNOSIS — R0902 Hypoxemia: Secondary | ICD-10-CM | POA: Insufficient documentation

## 2020-12-10 DIAGNOSIS — Z8669 Personal history of other diseases of the nervous system and sense organs: Secondary | ICD-10-CM | POA: Diagnosis present

## 2020-12-10 DIAGNOSIS — G4736 Sleep related hypoventilation in conditions classified elsewhere: Secondary | ICD-10-CM | POA: Insufficient documentation

## 2020-12-15 DIAGNOSIS — Z8669 Personal history of other diseases of the nervous system and sense organs: Secondary | ICD-10-CM | POA: Diagnosis not present

## 2020-12-15 NOTE — Procedures (Signed)
      Patient Name: Frank Coffey, Frank Coffey Date: 12/10/2020 Gender: Male D.O.B: 1980/07/25 Age (years): 41 Referring Provider: Geraldo Pitter NP Height (inches): 31 Interpreting Physician: Chesley Mires MD, ABSM Weight (lbs): 370 RPSGT: Baxter Flattery BMI: 49 MRN: [id number] Neck Size: 20.00  CLINICAL INFORMATION Sleep Study Type: NPSG  Indication for sleep study: Morbid Obesity, Obesity, Snoring, Witnesses Apnea / Gasping During Sleep  Epworth Sleepiness Score: 12  SLEEP STUDY TECHNIQUE As per the AASM Manual for the Scoring of Sleep and Associated Events v2.3 (April 2016) with a hypopnea requiring 4% desaturations.  The channels recorded and monitored were frontal, central and occipital EEG, electrooculogram (EOG), submentalis EMG (chin), nasal and oral airflow, thoracic and abdominal wall motion, anterior tibialis EMG, snore microphone, electrocardiogram, and pulse oximetry.  MEDICATIONS Medications self-administered by patient taken the night of the study : N/A  SLEEP ARCHITECTURE The study was initiated at 11:25:11 PM and ended at 5:22:34 AM.  Sleep onset time was 1.3 minutes and the sleep efficiency was 85.5%%. The total sleep time was 305.6 minutes.  Stage REM latency was N/A minutes.  The patient spent 1.1%% of the night in stage N1 sleep, 97.5%% in stage N2 sleep, 1.3%% in stage N3 and 0% in REM.  Alpha intrusion was absent.  Supine sleep was 87.73%.  RESPIRATORY PARAMETERS The overall apnea/hypopnea index (AHI) was 82.1 per hour. There were 414 total apneas, including 384 obstructive, 0 central and 30 mixed apneas. There were 4 hypopneas and 0 RERAs.  The AHI during Stage REM sleep was N/A per hour.  AHI while supine was 81.7 per hour.  The mean oxygen saturation was 81.6%. The minimum SpO2 during sleep was 51.0%.  Loud snoring was noted during this study. He met split night study criteria.  However, after demonstrating CPAP therapy the patient opted  against using this during the remainder of the study.  CARDIAC DATA The 2 lead EKG demonstrated sinus rhythm. The mean heart rate was 77.2 beats per minute. Other EKG findings include: PVCs.  LEG MOVEMENT DATA The total PLMS were 0 with a resulting PLMS index of 0.0. Associated arousal with leg movement index was 0.0 .  IMPRESSIONS - Severe obstructive sleep apnea occurred during this study (AHI = 82.1/h). - Severe oxygen desaturation was noted during this study (Min O2 = 51.0%). - The patient snored with loud snoring volume.  DIAGNOSIS - Obstructive Sleep Apnea (G47.33) - Nocturnal Hypoxemia (G47.36)  RECOMMENDATIONS - Therapeutic CPAP titration to determine optimal pressure required to alleviate sleep disordered breathing. - Avoid alcohol, sedatives and other CNS depressants that may worsen sleep apnea and disrupt normal sleep architecture. - Sleep hygiene should be reviewed to assess factors that may improve sleep quality. - Weight management and regular exercise should be initiated or continued if appropriate.  [Electronically signed] 12/15/2020 09:29 AM  Chesley Mires MD, ABSM Diplomate, American Board of Sleep Medicine   NPI: [id number] [place] PH: [phone]   FX: [phone] [place]

## 2021-01-03 ENCOUNTER — Encounter: Payer: 59 | Admitting: Nurse Practitioner

## 2021-01-13 ENCOUNTER — Encounter: Payer: 59 | Admitting: Nurse Practitioner

## 2021-01-13 ENCOUNTER — Other Ambulatory Visit: Payer: Self-pay | Admitting: *Deleted

## 2021-01-13 DIAGNOSIS — N184 Chronic kidney disease, stage 4 (severe): Secondary | ICD-10-CM

## 2021-01-17 ENCOUNTER — Telehealth: Payer: 59 | Admitting: Physician Assistant

## 2021-01-17 DIAGNOSIS — J029 Acute pharyngitis, unspecified: Secondary | ICD-10-CM

## 2021-01-17 NOTE — Progress Notes (Signed)
°  E-Visit for Sore Throat  We are sorry that you are not feeling well.  Here is how we plan to help!  Your symptoms indicate a likely viral infection (Pharyngitis).   Pharyngitis is inflammation in the back of the throat which can cause a sore throat, scratchiness and sometimes difficulty swallowing.   Pharyngitis is typically caused by a respiratory virus and will just run its course.  Please keep in mind that your symptoms could last up to 10 days.  For throat pain, we recommend over the counter oral pain relief medications such as acetaminophen or aspirin, or anti-inflammatory medications such as ibuprofen or naproxen sodium.  Topical treatments such as oral throat lozenges or sprays may be used as needed.  Avoid close contact with loved ones, especially the very young and elderly.  Remember to wash your hands thoroughly throughout the day as this is the number one way to prevent the spread of infection and wipe down door knobs and counters with disinfectant.  After careful review of your answers, I would not recommend an antibiotic for your condition.  Antibiotics should not be used to treat conditions that we suspect are caused by viruses like the virus that causes the common cold or flu. However, some people can have Strep with atypical symptoms. You may need formal testing in clinic or office to confirm if your symptoms continue or worsen.  Providers prescribe antibiotics to treat infections caused by bacteria. Antibiotics are very powerful in treating bacterial infections when they are used properly.  To maintain their effectiveness, they should be used only when necessary.  Overuse of antibiotics has resulted in the development of super bugs that are resistant to treatment!    Home Care: Only take medications as instructed by your medical team. Do not drink alcohol while taking these medications. A steam or ultrasonic humidifier can help congestion.  You can place a towel over your head and  breathe in the steam from hot water coming from a faucet. Avoid close contacts especially the very young and the elderly. Cover your mouth when you cough or sneeze. Always remember to wash your hands.  Get Help Right Away If: You develop worsening fever or throat pain. You develop a severe head ache or visual changes. Your symptoms persist after you have completed your treatment plan.  Make sure you Understand these instructions. Will watch your condition. Will get help right away if you are not doing well or get worse.   Thank you for choosing an e-visit.  Your e-visit answers were reviewed by a board certified advanced clinical practitioner to complete your personal care plan. Depending upon the condition, your plan could have included both over the counter or prescription medications.  Please review your pharmacy choice. Make sure the pharmacy is open so you can pick up prescription now. If there is a problem, you may contact your provider through [employer] and have the prescription routed to another pharmacy.  Your safety is important to [place]. If you have drug allergies check your prescription carefully.   For the next 24 hours you can use MyChart to ask questions about today's visit, request a non-urgent call back, or ask for a work or school excuse. You will get an email in the next two days asking about your experience. I hope that your e-visit has been valuable and will speed your recovery.

## 2021-01-17 NOTE — Progress Notes (Signed)
I have spent 5 minutes in review of e-visit questionnaire, review and updating patient chart, medical decision making and response to patient.   Alyan Hartline Cody Jrue Jarriel, PA-C    

## 2021-01-26 ENCOUNTER — Telehealth: Payer: Self-pay | Admitting: Primary Care

## 2021-01-26 ENCOUNTER — Other Ambulatory Visit (HOSPITAL_COMMUNITY): Payer: 59

## 2021-01-26 ENCOUNTER — Encounter: Payer: 59 | Admitting: Vascular Surgery

## 2021-01-26 ENCOUNTER — Encounter (HOSPITAL_COMMUNITY): Payer: 59

## 2021-01-26 DIAGNOSIS — Z8669 Personal history of other diseases of the nervous system and sense organs: Secondary | ICD-10-CM

## 2021-01-26 DIAGNOSIS — G4733 Obstructive sleep apnea (adult) (pediatric): Secondary | ICD-10-CM

## 2021-01-26 NOTE — Telephone Encounter (Signed)
Please let patient know sleep study on 12/14/20 showed severe OSA, he needs CPAP titration to determine pressure settings (I will place order).

## 2021-01-26 NOTE — Telephone Encounter (Signed)
Please also make sure patient has visit with me or Dr. Elsworth Soho in 1-2 months

## 2021-01-27 NOTE — Telephone Encounter (Signed)
Spoke with the pt and notified of response per [facility]. He verbalized understanding and was agreeable to CPAP titration study. I scheduled him for appt on 03/08/21 with RA.

## 2021-02-08 ENCOUNTER — Telehealth: Payer: 59 | Admitting: Physician Assistant

## 2021-02-08 DIAGNOSIS — J208 Acute bronchitis due to other specified organisms: Secondary | ICD-10-CM

## 2021-02-08 DIAGNOSIS — B9689 Other specified bacterial agents as the cause of diseases classified elsewhere: Secondary | ICD-10-CM | POA: Diagnosis not present

## 2021-02-08 MED ORDER — PREDNISONE 10 MG (21) PO TBPK
ORAL_TABLET | ORAL | 0 refills | Status: DC
Start: 2021-02-08 — End: 2021-04-07

## 2021-02-08 MED ORDER — BENZONATATE 100 MG PO CAPS
100.0000 mg | ORAL_CAPSULE | Freq: Three times a day (TID) | ORAL | 0 refills | Status: DC | PRN
Start: 2021-02-08 — End: 2021-10-19

## 2021-02-08 MED ORDER — AZITHROMYCIN 250 MG PO TABS
ORAL_TABLET | ORAL | 0 refills | Status: AC
Start: 2021-02-08 — End: 2021-02-13

## 2021-02-08 NOTE — Progress Notes (Signed)

## 2021-02-17 ENCOUNTER — Encounter (HOSPITAL_COMMUNITY): Payer: 59

## 2021-02-17 ENCOUNTER — Encounter: Payer: 59 | Admitting: Vascular Surgery

## 2021-02-17 ENCOUNTER — Other Ambulatory Visit (HOSPITAL_COMMUNITY): Payer: 59

## 2021-02-21 NOTE — Progress Notes (Deleted)
[facility] [place]  ASSESSMENT / PLAN: Frank Coffey is a 41 y.o. *** handed male in need of permanent hemodialysis access. I reviewed options for dialysis in detail with the patient. I counseled the patient that dialysis access requires surveillance and periodic maintenance. Plan to proceed with ***.    CHIEF COMPLAINT: ***  HISTORY OF PRESENT ILLNESS: Frank Coffey is a 41 y.o. male ***  VASCULAR SURGICAL HISTORY: ***  VASCULAR RISK FACTORS: {FINDINGS; POSITIVE NEGATIVE:(365) 015-9273} history of stroke / transient ischemic attack. {FINDINGS; POSITIVE NEGATIVE:(365) 015-9273} history of coronary artery disease. *** history of PCI. *** history of CABG.  {FINDINGS; POSITIVE NEGATIVE:(365) 015-9273} history of diabetes mellitus. Last A1c ***. {FINDINGS; POSITIVE NEGATIVE:(365) 015-9273} history of smoking. *** actively smoking. {FINDINGS; POSITIVE NEGATIVE:(365) 015-9273} history of hypertension. *** drug regimen with *** control. {FINDINGS; POSITIVE NEGATIVE:(365) 015-9273} history of chronic kidney disease.  Last GFR ***. CKD {stage:30421363}. {FINDINGS; POSITIVE NEGATIVE:(365) 015-9273} history of chronic obstructive pulmonary disease, treated with ***.  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)    Obesity    OSA (obstructive sleep apnea)    Pneumonia 04/2019   with Covid    Past Surgical History:  Procedure Laterality Date   BIOPSY   02/09/2020   Procedure: BIOPSY;  Surgeon: Irving Copas., MD;  Location: [facility] ENDOSCOPY;  Service: Gastroenterology;;   CHOLECYSTECTOMY     COLONOSCOPY WITH PROPOFOL N/A 02/09/2020   Procedure: COLONOSCOPY WITH PROPOFOL;  Surgeon: Irving Copas., MD;  Location: [facility] ENDOSCOPY;  Service: Gastroenterology;  Laterality: N/A;   ESOPHAGOGASTRODUODENOSCOPY (EGD) WITH PROPOFOL N/A 02/09/2020   Procedure: ESOPHAGOGASTRODUODENOSCOPY (EGD) WITH PROPOFOL;  Surgeon: Rush Landmark Telford Nab., MD;  Location: [place];  Service: Gastroenterology;  Laterality: N/A;   POLYPECTOMY  02/09/2020   Procedure: POLYPECTOMY;  Surgeon: Mansouraty, Telford Nab., MD;  Location[facility] ENDOSCOPY;  Service: Gastroenterology;;   RETINAL DETACHMENT REPAIR W/ SCLERAL BUCKLE LE     Left    Family History  Problem Relation Age of Onset   Hypertension Mother    Hyperlipidemia Mother    Heart disease Mother    Kidney disease Mother    Hypertension Maternal Grandmother    Stroke Maternal Grandmother    Heart disease Maternal Grandmother    Hyperlipidemia Maternal Grandmother    Stroke Maternal Grandfather    Hypertension Maternal Grandfather    Heart disease Maternal Grandfather    Hyperlipidemia Maternal Grandfather    Alzheimer's disease Maternal Grandfather    Cancer - Other Paternal Grandmother    Healthy Brother        x2   Healthy Sister        x2   Stomach cancer Father    Healthy Son        x2   Healthy Daughter        x1   Diabetes Neg Hx    Heart attack Neg Hx    Sudden death Neg  Hx    Colon cancer Neg Hx    Esophageal cancer Neg Hx    Pancreatic cancer Neg Hx    Inflammatory bowel disease Neg Hx    Liver disease Neg Hx    Rectal cancer Neg Hx     Social History   Socioeconomic History   Marital status: Divorced    Spouse name: Not on file   Number of children: Not on file   Years of education: Not on file   Highest education level: Not on file  Occupational History   Not on  file  Tobacco Use   Smoking status: Never   Smokeless tobacco: Never  Vaping Use   Vaping Use: Never used  Substance and Sexual Activity   Alcohol use: Not Currently    Comment: occ   Drug use: No   Sexual activity: Not on file  Other Topics Concern   Not on file  Social History Narrative   Epworth Sleepiness Scale = 10 (as of 12/01/14)   Social Determinants of Health   Financial Resource Strain: Not on file  Food Insecurity: Not on file  Transportation Needs: Not on file  Physical Activity: Not on file  Stress: Not on file  Social Connections: Not on file  Intimate Partner Violence: Not on file    No Known Allergies  Current Outpatient Medications  Medication Sig Dispense Refill   allopurinol (ZYLOPRIM) 300 MG tablet TAKE 1 TABLET(300 MG) BY MOUTH DAILY 90 tablet 0   amLODipine (NORVASC) 5 MG tablet Take 5 mg by mouth daily.     atorvastatin (LIPITOR) 20 MG tablet Take 1 tablet (20 mg total) by mouth daily. 90 tablet 3   benzonatate (TESSALON) 100 MG capsule Take 1 capsule (100 mg total) by mouth 3 (three) times daily as needed. 30 capsule 0   brimonidine (ALPHAGAN) 0.2 % ophthalmic solution Place 1 drop into both eyes 2 (two) times daily.     carvedilol (COREG) 25 MG tablet Take 2 tablets (50 mg total) by mouth 2 (two) times daily. 360 tablet 3   dorzolamide-timolol (COSOPT) 22.3-6.8 MG/ML ophthalmic solution Place 1 drop into both eyes 2 (two) times daily.     EQ FIBER SUPPLEMENT PO Take 2 tablets by mouth daily. colon cleanse     hydrALAZINE (APRESOLINE) 100 MG tablet Take 1 tablet (100 mg total) by mouth 3 (three) times daily. 270 tablet 3   isosorbide mononitrate (IMDUR) 30 MG 24 hr tablet Take 1 tablet (30 mg total) by mouth daily. 90 tablet 3   latanoprost (XALATAN) 0.005 % ophthalmic solution Place 1 drop into the left eye at bedtime.     linaclotide (LINZESS) 145 MCG CAPS capsule Take 1 capsule (145 mcg total) by mouth daily before breakfast. **Needs appointment  before next refill given** 90 capsule 3   omeprazole (PRILOSEC) 40 MG capsule Take 1 capsule (40 mg total) by mouth daily. 30 capsule 6   Potassium Chloride ER 20 MEQ TBCR Take 1 tablet by mouth daily.     predniSONE (STERAPRED UNI-PAK 21 TAB) 10 MG (21) TBPK tablet 6 day taper, take as directed on package instructions 21 tablet 0   torsemide (DEMADEX) 20 MG tablet Take 2 tablets (40 mg total) by mouth 2 (two) times daily. (Patient taking differently: Take 120 mg by mouth 2 (two) times daily. 4 tabs (80mg ) am, 2 tabs (40mg ) pm) 360 tablet 3   No current facility-administered medications for this visit.    REVIEW OF SYSTEMS:  [  X] denotes positive finding, [ ]  denotes negative finding Cardiac  Comments:  Chest pain or chest pressure: ***   Shortness of breath upon exertion:    Short of breath when lying flat:    Irregular heart rhythm:        Vascular    Pain in calf, thigh, or hip brought on by ambulation:    Pain in feet at night that wakes you up from your sleep:     Blood clot in your veins:    Leg swelling:         Pulmonary    Oxygen at home:    Productive cough:     Wheezing:         Neurologic    Sudden weakness in arms or legs:     Sudden numbness in arms or legs:     Sudden onset of difficulty speaking or slurred speech:    Temporary loss of vision in one eye:     Problems with dizziness:         Gastrointestinal    Blood in stool:     Vomited blood:         Genitourinary    Burning when urinating:     Blood in urine:        Psychiatric    Major depression:         Hematologic    Bleeding problems:    Problems with blood clotting too easily:        Skin    Rashes or ulcers:        Constitutional    Fever or chills:      PHYSICAL EXAM There were no vitals filed for this visit.  Constitutional: *** appearing. *** distress. Appears *** nourished.  Neurologic: CN ***. *** focal findings. *** sensory loss. Psychiatric: *** Mood and affect symmetric and  appropriate. Eyes: *** No icterus. No conjunctival pallor. Ears, nose, throat: *** mucous membranes moist. Midline trachea.  Cardiac: *** rate and rhythm.  Respiratory: *** unlabored. Abdominal: *** soft, non-tender, non-distended.  Peripheral vascular: *** Extremity: *** edema. *** cyanosis. *** pallor.  Skin: *** gangrene. *** ulceration.  Lymphatic: *** Stemmer's sign. *** palpable lymphadenopathy.  PERTINENT LABORATORY AND RADIOLOGIC DATA  Most recent CBC CBC Latest Ref Rng & Units 08/10/2020 04/05/2020 12/04/2019  WBC 4.0 - 10.5 K/uL 9.5 8.6 9.0  Hemoglobin 13.0 - 17.0 g/dL 12.0(L) 11.8(A) 11.8(L)  Hematocrit 39.0 - 52.0 % 39.7 37(A) 38.2(L)  Platelets 150 - 400 K/uL 288 285 278.0     Most recent CMP CMP Latest Ref Rng & Units 08/10/2020 04/05/2020 01/26/2020  Glucose 70 - 99 mg/dL 103(H) - 89  BUN 6 - 20 mg/dL 53(H) 51(A) 35(H)  Creatinine 0.61 - 1.24 mg/dL 5.80(H) 5.0(A) 4.58(HH)  Sodium 135 - 145 mmol/L 139 140 136  Potassium 3.5 - 5.1 mmol/L 3.5 3.3(A) 3.6  Chloride 98 - 111 mmol/L 102 102 101  CO2 22 - 32 mmol/L 28 27(A) 29  Calcium 8.9 - 10.3 mg/dL 8.4(L) 8.6(A) 9.0  Total Protein 6.5 - 8.1 g/dL - - -  Total Bilirubin 0.3 - 1.2 mg/dL - - -  Alkaline Phos 38 - 126 U/L - - -  AST 15 - 41 U/L - - -  ALT 0 - 44 U/L - - -    Renal function CrCl cannot be calculated (Patient's most recent lab result is older than the maximum 21 days allowed.).  Hgb A1c MFr Bld (%)  Date  Value  05/29/2018 6.0    LDL Cholesterol  Date Value Ref Range Status  05/29/2018 137 (H) 0 - 99 mg/dL Final     Vascular Imaging: ***  Dominigue Gellner N. Stanford Breed, MD [facility] Phone Number: [phone] 02/21/2021 7:39 PM  Total time spent on preparing this encounter including chart review, data review, collecting history, examining the patient, coordinating care for this {tnhtimebilling:26202}  Portions of this report may have been transcribed using voice  recognition software.  Every effort has been made to ensure accuracy; however, inadvertent computerized transcription errors may still be present.

## 2021-02-22 ENCOUNTER — Other Ambulatory Visit (HOSPITAL_COMMUNITY): Payer: 59

## 2021-02-22 ENCOUNTER — Encounter: Payer: 59 | Admitting: Vascular Surgery

## 2021-02-22 ENCOUNTER — Encounter (HOSPITAL_COMMUNITY): Payer: 59

## 2021-02-23 NOTE — Progress Notes (Deleted)
Cardiology Office Note:    Date:  02/23/2021   ID:  Frank Coffey, DOB Jul 07, 1980, MRN [id number]  PCP:  Flossie Buffy, NP  [facility] Cardiologist:  Werner Lean, MD  [facility] Electrophysiologist:  None   CC: HF F/U  History of Present Illness:    Frank Coffey is a 41 y.o. 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  In interim of this visit, patient had borderline dilation of his thoracic aorta with stable EF.  Has started classes for HD.    Patient notes that he is doing ***.   Since day prior/last visit notes *** . There are no*** interval hospital/ED visit.    No chest pain or pressure ***.  No SOB/DOE*** and no PND/Orthopnea***.  No weight gain or leg swelling***.  No palpitations or syncope ***.  Ambulatory blood pressure ***.   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)    Obesity    OSA (obstructive sleep apnea)    Pneumonia 04/2019   with Covid    Past Surgical History:  Procedure Laterality Date   BIOPSY  02/09/2020   Procedure: BIOPSY;  Surgeon: Irving Copas., MD;  Location: [facility] ENDOSCOPY;  Service: Gastroenterology;;   CHOLECYSTECTOMY     COLONOSCOPY WITH PROPOFOL N/A 02/09/2020   Procedure: COLONOSCOPY WITH PROPOFOL;  Surgeon: Irving Copas., MD;  Location: [facility] ENDOSCOPY;  Service: Gastroenterology;  Laterality: N/A;   ESOPHAGOGASTRODUODENOSCOPY (EGD)  WITH PROPOFOL N/A 02/09/2020   Procedure: ESOPHAGOGASTRODUODENOSCOPY (EGD) WITH PROPOFOL;  Surgeon: Rush Landmark Telford Nab., MD;  Location: [place];  Service: Gastroenterology;  Laterality: N/A;   POLYPECTOMY  02/09/2020   Procedure: POLYPECTOMY;  Surgeon: Rush Landmark Telford Nab., MD;  Location: [place];  Service: Gastroenterology;;   RETINAL DETACHMENT REPAIR W/ SCLERAL BUCKLE LE     Left    Current Medications: No outpatient medications have been marked as taking for the 02/24/21 encounter (Appointment) with Werner Lean, MD.     Allergies:   Patient has no known allergies.   Social History   Socioeconomic History   Marital status: Divorced    Spouse name: Not on file   Number of children: Not on file   Years of education: Not on file   Highest education level: Not on file  Occupational History   Not on file  Tobacco Use   Smoking status: Never   Smokeless tobacco: Never  Vaping Use   Vaping Use: Never used  Substance and Sexual Activity   Alcohol use: Not Currently    Comment: occ   Drug use: No   Sexual activity: Not on file  Other Topics Concern   Not on file  Social History Narrative   Epworth Sleepiness Scale = 10 (as of 12/01/14)   Social Determinants of Health   Financial  Resource Strain: Not on file  Food Insecurity: Not on file  Transportation Needs: Not on file  Physical Activity: Not on file  Stress: Not on file  Social Connections: Not on file    Social: former semi-pro football player  Family History: The patient's family history includes Alzheimer's disease in his maternal grandfather; Cancer - Other in his paternal grandmother; Healthy in his brother, daughter, sister, and son; Heart disease in his maternal grandfather, maternal grandmother, and mother; Hyperlipidemia in his maternal grandfather, maternal grandmother, and mother; Hypertension in his maternal grandfather, maternal grandmother, and mother; Kidney disease in his  mother; Stomach cancer in his father; Stroke in his maternal grandfather and maternal grandmother. There is no history of Diabetes, Heart attack, Sudden death, Colon cancer, Esophageal cancer, Pancreatic cancer, Inflammatory bowel disease, Liver disease, or Rectal cancer. Notes that his mother died of NCIM NOS and does not want to end up like her.  Notes that he was her POA and has records confirmed his previous records request.  ROS:   Please see the history of present illness.     All other systems reviewed and are negative.  EKGs/Labs/Other Studies Reviewed:    The following studies were reviewed today:  EKG:   02/12/2020: SR 1st HB LVH with 2nd repolarization 05/01/19: SR rate 62 with LVH  Cardiac Event Monitoring: Date: 11/28/2017 Results: Quality: Fair.  Baseline artifact. Predominant rhythm: sinus rhythm Average heart rate: 81 bpm Max heart rate: 114 bpm Min heart rate: 49 bpm  One episode of second degree heart block, Mobitz type I No other arrhythmias noted.     Transthoracic Echocardiogram: Date: 02/18/20 Results: 1. Left ventricular ejection fraction, by estimation, is 35%. The left  ventricle has moderately decreased function. The left ventricle  demonstrates global hypokinesis. The left ventricular internal cavity size  was moderately dilated. There is mild left  ventricular hypertrophy. Left ventricular diastolic parameters are  consistent with Grade II diastolic dysfunction (pseudonormalization).   2. Right ventricular systolic function is normal. The right ventricular  size is normal. Tricuspid regurgitation signal is inadequate for assessing  PA pressure.   3. Left atrial size was moderately dilated.   4. Right atrial size was mildly dilated.   5. The mitral valve is normal in structure. No evidence of mitral valve  regurgitation. No evidence of mitral stenosis.   6. The aortic valve is tricuspid. Aortic valve regurgitation is not  visualized. No aortic  stenosis is present.   7. Aortic dilatation noted. There is mild dilatation of the aortic root,  measuring 37 mm.   8. The inferior vena cava is normal in size with greater than 50%  respiratory variability, suggesting right atrial pressure of 3 mmHg.    Recent Labs: 08/10/2020: B Natriuretic Peptide 315.8; BUN 53; Creatinine, Ser 5.80; Hemoglobin 12.0; Platelets 288; Potassium 3.5; Sodium 139  Recent Lipid Panel    Component Value Date/Time   CHOL 190 05/29/2018 0923   TRIG 121 04/30/2019 1339   HDL 32.80 (L) 05/29/2018 0923   CHOLHDL 6 05/29/2018 0923   VLDL 20.8 05/29/2018 0923   LDLCALC 137 (H) 05/29/2018 0923    Risk Assessment/Calculations:    N/A  Physical Exam:    VS:  There were no vitals taken for this visit.    Wt Readings from Last 3 Encounters:  12/10/20 (!) 167.8 kg  11/19/20 (!) 170.6 kg  10/22/20 (!) 172.8 kg    GEN: Obese, well developed in no acute distress HEENT: Normal NECK:  No JVD  No carotid bruits LYMPHATICS: No lymphadenopathy CARDIAC: RRR, no murmurs, rubs, gallops RESPIRATORY:  Clear to auscultation without rales, wheezing or rhonchi  ABDOMEN: Soft, non-tender, non-distended MUSCULOSKELETAL:  Non-pitting edema ; No deformity  SKIN: Warm and dry NEUROLOGIC:  Alert and oriented x 3 PSYCHIATRIC:  Normal affect   ASSESSMENT:    No diagnosis found.   PLAN:    In order of problems listed above:  Heart Failure reduced Ejection Fraction IDA CKD Stage IV - NYHA class II, Stage C, euvolemic, etiology unclear - Diuretic regimen: Continue torsemide 40 mg PO daily and 20 mg PO PM - Coreg 50 mg PO BID (he is > 85 kg) - ARNI/ARB/ACEi/aldactone/SGLT2i stopped in the setting of CKD IV - Continue hydralazine 100 mg TID  - Imdur 30 mg PO daily    Essential Hypertension and morbid obesity - amb BP has been suggested at most visit ***  Hyperlipidemia (uspecified) -LDL goal less than 100 -continue current statin - gave education on dietary  changes  Will Fax Notes to [facility]        Medication Adjustments/Labs and Tests Ordered: Current medicines are reviewed at length with the patient today.  Concerns regarding medicines are outlined above.  No orders of the defined types were placed in this encounter.   No orders of the defined types were placed in this encounter.    There are no Patient Instructions on file for this visit.   Signed, Werner Lean, MD  02/23/2021 7:05 PM    [place]

## 2021-02-24 ENCOUNTER — Ambulatory Visit: Payer: 59 | Admitting: Internal Medicine

## 2021-03-01 ENCOUNTER — Ambulatory Visit: Payer: 59 | Admitting: Nurse Practitioner

## 2021-03-01 ENCOUNTER — Other Ambulatory Visit: Payer: Self-pay

## 2021-03-01 NOTE — Progress Notes (Deleted)
Patient was previously seen by Volanda Napoleon NP on 10/22/2020 for history of sleep apnea.  Split-night study was ordered which showed severe obstructive sleep apnea with AHI 82.1, SPO2 low of 51% and 237.1 minutes spent <88%.  CPAP titration study ordered (CPT 95811).  Notice from insurance plan denied request for CPAP titration; however details on the denial claim stated that titration would be appropriate next step.  Peer to peer performed. Case number [id number].

## 2021-03-02 NOTE — Progress Notes (Unsigned)
Cardiology Office Note:    Date:  03/02/2021   ID:  Frank Coffey, DOB [birth year], MRN [id number]  PCP:  Flossie Buffy, NP  [facility] Cardiologist:  Werner Lean, MD  [facility] Electrophysiologist:  None   CC: HF F/U  History of Present Illness:    Frank Coffey is a 41 y.o. 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  In interim of this visit, patient had borderline dilation of his thoracic aorta with stable EF.  Has started classes for HD.    Patient notes that he is doing ***.   Since day prior/last visit notes *** . There are no*** interval hospital/ED visit.    No chest pain or pressure ***.  No SOB/DOE*** and no PND/Orthopnea***.  No weight gain or leg swelling***.  No palpitations or syncope ***.  Ambulatory blood pressure ***.   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)    Obesity    OSA (obstructive sleep apnea)    Pneumonia 04/2019   with Covid    Past Surgical History:  Procedure Laterality Date   BIOPSY  02/09/2020   Procedure: BIOPSY;  Surgeon: Irving Copas., MD;  Location: [facility] ENDOSCOPY;  Service: Gastroenterology;;   CHOLECYSTECTOMY     COLONOSCOPY WITH PROPOFOL N/A 02/09/2020   Procedure: COLONOSCOPY WITH PROPOFOL;  Surgeon: Irving Copas., MD;  Location: [facility] ENDOSCOPY;  Service: Gastroenterology;  Laterality: N/A;   ESOPHAGOGASTRODUODENOSCOPY (EGD)  WITH PROPOFOL N/A 02/09/2020   Procedure: ESOPHAGOGASTRODUODENOSCOPY (EGD) WITH PROPOFOL;  Surgeon: Rush Landmark Telford Nab., MD;  Location: [place];  Service: Gastroenterology;  Laterality: N/A;   POLYPECTOMY  02/09/2020   Procedure: POLYPECTOMY;  Surgeon: Rush Landmark Telford Nab., MD;  Location: [place];  Service: Gastroenterology;;   RETINAL DETACHMENT REPAIR W/ SCLERAL BUCKLE LE     Left    Current Medications: No outpatient medications have been marked as taking for the 03/03/21 encounter (Appointment) with Werner Lean, MD.     Allergies:   Patient has no known allergies.   Social History   Socioeconomic History   Marital status: Divorced    Spouse name: Not on file   Number of children: Not on file   Years of education: Not on file   Highest education level: Not on file  Occupational History   Not on file  Tobacco Use   Smoking status: Never   Smokeless tobacco: Never  Vaping Use   Vaping Use: Never used  Substance and Sexual Activity   Alcohol use: Not Currently    Comment: occ   Drug use: No   Sexual activity: Not on file  Other Topics Concern   Not on file  Social History Narrative   Epworth Sleepiness Scale = 10 (as of 12/01/14)   Social Determinants of Health   Financial  Resource Strain: Not on file  Food Insecurity: Not on file  Transportation Needs: Not on file  Physical Activity: Not on file  Stress: Not on file  Social Connections: Not on file    Social: former semi-pro football player  Family History: The patient's family history includes Alzheimer's disease in his maternal grandfather; Cancer - Other in his paternal grandmother; Healthy in his brother, daughter, sister, and son; Heart disease in his maternal grandfather, maternal grandmother, and mother; Hyperlipidemia in his maternal grandfather, maternal grandmother, and mother; Hypertension in his maternal grandfather, maternal grandmother, and mother; Kidney disease in his  mother; Stomach cancer in his father; Stroke in his maternal grandfather and maternal grandmother. There is no history of Diabetes, Heart attack, Sudden death, Colon cancer, Esophageal cancer, Pancreatic cancer, Inflammatory bowel disease, Liver disease, or Rectal cancer. Notes that his mother died of NCIM NOS and does not want to end up like her.  Notes that he was her POA and has records confirmed his previous records request.  ROS:   Please see the history of present illness.     All other systems reviewed and are negative.  EKGs/Labs/Other Studies Reviewed:    The following studies were reviewed today:  EKG:   02/12/2020: SR 1st HB LVH with 2nd repolarization 05/01/19: SR rate 62 with LVH  Cardiac Event Monitoring: Date: 11/28/2017 Results: Quality: Fair.  Baseline artifact. Predominant rhythm: sinus rhythm Average heart rate: 81 bpm Max heart rate: 114 bpm Min heart rate: 49 bpm  One episode of second degree heart block, Mobitz type I No other arrhythmias noted.     Transthoracic Echocardiogram: Date: 02/18/20 Results: 1. Left ventricular ejection fraction, by estimation, is 35%. The left  ventricle has moderately decreased function. The left ventricle  demonstrates global hypokinesis. The left ventricular internal cavity size  was moderately dilated. There is mild left  ventricular hypertrophy. Left ventricular diastolic parameters are  consistent with Grade II diastolic dysfunction (pseudonormalization).   2. Right ventricular systolic function is normal. The right ventricular  size is normal. Tricuspid regurgitation signal is inadequate for assessing  PA pressure.   3. Left atrial size was moderately dilated.   4. Right atrial size was mildly dilated.   5. The mitral valve is normal in structure. No evidence of mitral valve  regurgitation. No evidence of mitral stenosis.   6. The aortic valve is tricuspid. Aortic valve regurgitation is not  visualized. No aortic  stenosis is present.   7. Aortic dilatation noted. There is mild dilatation of the aortic root,  measuring 37 mm.   8. The inferior vena cava is normal in size with greater than 50%  respiratory variability, suggesting right atrial pressure of 3 mmHg.    Recent Labs: 08/10/2020: B Natriuretic Peptide 315.8; BUN 53; Creatinine, Ser 5.80; Hemoglobin 12.0; Platelets 288; Potassium 3.5; Sodium 139  Recent Lipid Panel    Component Value Date/Time   CHOL 190 05/29/2018 0923   TRIG 121 04/30/2019 1339   HDL 32.80 (L) 05/29/2018 0923   CHOLHDL 6 05/29/2018 0923   VLDL 20.8 05/29/2018 0923   LDLCALC 137 (H) 05/29/2018 0923    Risk Assessment/Calculations:    N/A  Physical Exam:    VS:  There were no vitals taken for this visit.    Wt Readings from Last 3 Encounters:  12/10/20 (!) 167.8 kg  11/19/20 (!) 170.6 kg  10/22/20 (!) 172.8 kg    GEN: Obese, well developed in no acute distress HEENT: Normal NECK:  No JVD  No carotid bruits LYMPHATICS: No lymphadenopathy CARDIAC: RRR, no murmurs, rubs, gallops RESPIRATORY:  Clear to auscultation without rales, wheezing or rhonchi  ABDOMEN: Soft, non-tender, non-distended MUSCULOSKELETAL:  Non-pitting edema ; No deformity  SKIN: Warm and dry NEUROLOGIC:  Alert and oriented x 3 PSYCHIATRIC:  Normal affect   ASSESSMENT:    No diagnosis found.   PLAN:    In order of problems listed above:  Heart Failure reduced Ejection Fraction IDA CKD Stage IV - NYHA class II, Stage C, euvolemic, etiology unclear - Diuretic regimen: Continue torsemide 40 mg PO daily and 20 mg PO PM - Coreg 50 mg PO BID (he is > 85 kg) - ARNI/ARB/ACEi/aldactone/SGLT2i stopped in the setting of CKD IV - Continue hydralazine 100 mg TID  - Imdur 30 mg PO daily    Essential Hypertension and morbid obesity - amb BP has been suggested at most visit ***  Hyperlipidemia (uspecified) -LDL goal less than 100 -continue current statin - gave education on dietary  changes  Will Fax Notes to [facility]        Medication Adjustments/Labs and Tests Ordered: Current medicines are reviewed at length with the patient today.  Concerns regarding medicines are outlined above.  No orders of the defined types were placed in this encounter.   No orders of the defined types were placed in this encounter.    There are no Patient Instructions on file for this visit.   Signed, Werner Lean, MD  03/02/2021 10:54 AM    [place]

## 2021-03-03 ENCOUNTER — Encounter (HOSPITAL_BASED_OUTPATIENT_CLINIC_OR_DEPARTMENT_OTHER): Payer: 59 | Admitting: Pulmonary Disease

## 2021-03-03 ENCOUNTER — Ambulatory Visit: Payer: 59 | Admitting: Internal Medicine

## 2021-03-04 ENCOUNTER — Ambulatory Visit (INDEPENDENT_AMBULATORY_CARE_PROVIDER_SITE_OTHER): Payer: 59 | Admitting: Nurse Practitioner

## 2021-03-04 DIAGNOSIS — G4733 Obstructive sleep apnea (adult) (pediatric): Secondary | ICD-10-CM

## 2021-03-04 NOTE — Progress Notes (Signed)
Patient was previously seen by Volanda Napoleon NP on 10/22/2020 for history of sleep apnea.  Split-night study was ordered which showed severe obstructive sleep apnea with AHI 82.1, SPO2 low of 51% and 237.1 minutes spent <88%.  CPAP titration study ordered (CPT 95811).  Notice from insurance plan denied request for CPAP titration; however details on the denial claim stated that titration would be appropriate next step.  Peer to peer performed. Case number [id number]. Confirmed CPAP titration was intended order with CPT 95811. Approved. Authorization: [id number]. Valid until August 31 2021.

## 2021-03-08 ENCOUNTER — Ambulatory Visit: Payer: 59 | Admitting: Pulmonary Disease

## 2021-03-09 ENCOUNTER — Encounter: Payer: 59 | Admitting: Vascular Surgery

## 2021-03-09 ENCOUNTER — Other Ambulatory Visit (HOSPITAL_COMMUNITY): Payer: 59

## 2021-03-09 ENCOUNTER — Encounter (HOSPITAL_COMMUNITY): Payer: 59

## 2021-04-03 ENCOUNTER — Other Ambulatory Visit: Payer: Self-pay

## 2021-04-03 ENCOUNTER — Ambulatory Visit (HOSPITAL_BASED_OUTPATIENT_CLINIC_OR_DEPARTMENT_OTHER): Payer: 59 | Attending: Primary Care | Admitting: Pulmonary Disease

## 2021-04-03 DIAGNOSIS — G4733 Obstructive sleep apnea (adult) (pediatric): Secondary | ICD-10-CM

## 2021-04-06 NOTE — Procedures (Signed)
? ? ? ?  Patient Name: Frank Coffey, Frank Coffey ?Study Date: 04/03/2021 ?Gender: Male ?D.O.B: May 29, 1980 ?Age (years): 40 ?Referring Provider: Geraldo Pitter NP ?Height (inches): 68 ?Interpreting Physician: Chesley Mires MD, ABSM ?Weight (lbs): 370 ?RPSGT: Baxter Flattery ?BMI: 56 ?MRN: [id number] ?Neck Size: 20.00 ? ?CLINICAL INFORMATION ?The patient is referred for a BiPAP titration to treat sleep apnea. ? ?Date of HST 12/12/16: 64.1, SpO2 low 57%. ? ?SLEEP STUDY TECHNIQUE ?As per the AASM Manual for the Scoring of Sleep and Associated Events v2.3 (April 2016) with a hypopnea requiring 4% desaturations. ? ?The channels recorded and monitored were frontal, central and occipital EEG, electrooculogram (EOG), submentalis EMG (chin), nasal and oral airflow, thoracic and abdominal wall motion, anterior tibialis EMG, snore microphone, electrocardiogram, and pulse oximetry. Bilevel positive airway pressure (BPAP) was initiated at the beginning of the study and titrated to treat sleep-disordered breathing. ? ?MEDICATIONS ?Medications self-administered by patient taken the night of the study : N/A ? ?RESPIRATORY PARAMETERS ?Optimal IPAP Pressure (cm): 26 AHI at Optimal Pressure (/hr) 18.4 ?Optimal EPAP Pressure (cm): 22  ? ?Overall Minimal O2 (%): 52.0 Minimal O2 at Optimal Pressure (%): 77.0 ? ?SLEEP ARCHITECTURE ?Start Time: 11:13:32 PM Stop Time: 5:13:09 AM Total Time (min): 359.6 Total Sleep Time (min): 238.7 ?Sleep Latency (min): 13.5 Sleep Efficiency (%): 66.4% REM Latency (min): 212.0 WASO (min): 107.5 ?Stage N1 (%): 17.8% Stage N2 (%): 65.4% Stage N3 (%): 3.6% Stage R (%): 13.2 ?Supine (%): 100.00 Arousal Index (/hr): 56.8  ? ?CARDIAC DATA ?The 2 lead EKG demonstrated sinus rhythm. The mean heart rate was 72.1 beats per minute. Other EKG findings include: Atrial Fibrillation, PVCs. ? ?LEG MOVEMENT DATA ?The total Periodic Limb Movements of Sleep (PLMS) were 0. The PLMS index was 0.0. A PLMS index of <15 is considered normal in  adults. ? ?IMPRESSIONS ?- Difficult titration study. ?- He did best with Bipap ST 26/22 cm H2O with respiratory rate 12, Ti max = 2 min, Ti min = 0.3 min. ?- Supplemental oxygen was not used during this study. ? ?DIAGNOSIS ?- Obstructive Sleep Apnea ?- Atrial Fibrillation ? ?RECOMMENDATIONS ?- Trial of BiPAP ST therapy on 26/22 cm H2O with a Medium size Fisher&Paykel Full Face Simplus mask and heated humidification. ?- Avoid alcohol, sedatives and other CNS depressants that may worsen sleep apnea and disrupt normal sleep architecture. ?- Sleep hygiene should be reviewed to assess factors that may improve sleep quality. ?- Weight management and regular exercise should be initiated or continued. ? ?[Electronically signed] 04/06/2021 01:13 PM ? ?Chesley Mires MD, ABSM ?Diplomate, Tax adviser of Sleep Medicine ?NPI: [id number] ? ?[place] ?PH: [phone]   FX: [phone] ?ACCREDITED BY THE AMERICAN ACADEMY OF SLEEP MEDICINE ? ?

## 2021-04-07 ENCOUNTER — Ambulatory Visit (INDEPENDENT_AMBULATORY_CARE_PROVIDER_SITE_OTHER): Payer: 59 | Admitting: Pulmonary Disease

## 2021-04-07 ENCOUNTER — Other Ambulatory Visit: Payer: Self-pay

## 2021-04-07 ENCOUNTER — Encounter: Payer: Self-pay | Admitting: Pulmonary Disease

## 2021-04-07 VITALS — BP 136/68 | HR 77 | Temp 98.6°F | Ht 68.0 in | Wt 383.4 lb

## 2021-04-07 DIAGNOSIS — G4733 Obstructive sleep apnea (adult) (pediatric): Secondary | ICD-10-CM

## 2021-04-07 DIAGNOSIS — I5022 Chronic systolic (congestive) heart failure: Secondary | ICD-10-CM | POA: Diagnosis not present

## 2021-04-07 NOTE — Progress Notes (Signed)
? ?  Subjective:  ? ? Patient ID: Frank Coffey, male    DOB: 02-Mar-1980, 41 y.o.   MRN: [id number] ? ?HPI ?41 year old morbidly obese man for follow-up of OSA ? ?PMH - hypertensive heart disease with CKD stage III and chronic systolic heart failure EF 35% ? ?He was diagnosed several years ago, last seen by me in 2019.  Sleep study in 2018 had confirmed severe OSA and we sent in a prescription for auto CPAP 5 to 15 cm.  He was then lost to follow-up again until he was reseen in 2022, his CPAP machine had stopped working and there was a break in therapy.  Repeat NPSG showed severe OSA and desaturations, he underwent CPAP titration study in March 2023 which showed BiPAP requirement of 26/22 ?He states that that was the best night sleep that he had. ?He is eager to start back on his machine. ? ?He reports increased somnolence, snoring and fatigue. ?He had eye surgery a month ago. ?He is compliant with torsemide ? ?Reviewed last consultation with cardiology and ophthalmology ?He has gained 26 pounds since his last visit in 2019 ? ?Significant tests/ events reviewed ? ? ?03/2021 Titration >>  BiPAP ST 26/22 12, Ti max = 2 min, Ti min = 0.3 min., med FF mask ? ?NPSG 11/2020  AHI 82.1, SPO2 low of 51% and 237.1 minutes spent <88% ? ?11/2016 HST >> severe OSA, AHI 64/h ? ?Past Medical History:  ?Diagnosis Date  ? CHF (congestive heart failure) ([place])   ? Chronic constipation   ? Chronic kidney disease, stage 3, mod decreased GFR (HCC)   ? Followed by [facility]  ? Depression   ? "when I had Covid"  ? Gout   ? 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 History of kidney stones   ? Passed  ? Hx of migraine headaches   ? Hyperlipidemia   ? Hypertension   ? Hypertensive heart disease with congestive heart failure and stage 3 kidney disease ([place])   ? Obesity   ? OSA (obstructive sleep apnea)   ? Pneumonia 04/2019  ? with Covid  ? ? ? ? ?Review of Systems ?neg for any significant sore throat, dysphagia, itching, sneezing, nasal congestion or  excess/ purulent secretions, fever, chills, sweats, unintended wt loss, pleuritic or exertional cp, hempoptysis, orthopnea pnd or change in chronic leg swelling. Also denies presyncope, palpitations, heartburn, abdominal pain, nausea, vomiting, diarrhea or change in bowel or urinary habits, dysuria,hematuria, rash, arthralgias, visual complaints, headache, numbness weakness or ataxia. ? ?   ?Objective:  ? Physical Exam ? ?Gen. Pleasant, obese, in no distress, normal affect ?ENT - no pallor,icterus, no post nasal drip, class 2-3 airway ?Neck: No JVD, no thyromegaly, no carotid bruits ?Lungs: no use of accessory muscles, no dullness to percussion, decreased without rales or rhonchi  ?Cardiovascular: Rhythm regular, heart sounds  normal, no murmurs or gallops, 2+ peripheral edema ?Abdomen: soft and non-tender, no hepatosplenomegaly, BS normal. ?Musculoskeletal: No deformities, no cyanosis or clubbing ?Neuro:  alert, non focal, no tremors ? ? ? ?   ?Assessment & Plan:  ? ? ?

## 2021-04-07 NOTE — Assessment & Plan Note (Signed)
Cardiovascular implications of untreated OSA was emphasized. ?He is compliant with torsemide ?

## 2021-04-07 NOTE — Patient Instructions (Signed)
?  X Rx for auto BiPAP will be sent to DME ? ?OK to take melatonin 10 mg 2 h prior to bedtime ?

## 2021-04-07 NOTE — Assessment & Plan Note (Signed)
Severe, life-threatening OSA with high cardiovascular risk factors. ?We will send in prescription for BiPAP ST, he seems to require high pressures 26/22 cm, we will set him up with auto BiPAP settings with EPAP minimum of 10, IPAP maximum of 26, pressure support +4, TI max 2 minutes and TI minimum 0.3 minutes with backup respiratory rate of 12 ? ?Weight loss encouraged, compliance with goal of at least 4-6 hrs every night is the expectation. ?Advised against medications with sedative side effects ?Cautioned against driving when sleepy - understanding that sleepiness will vary on a day to day basis ? ?

## 2021-04-13 LAB — BASIC METABOLIC PANEL
BUN: 43 — AB (ref 4–21)
CO2: 28 — AB (ref 13–22)
Chloride: 101 (ref 99–108)
Creatinine: 5.6 — AB (ref <=1.3)
Glucose: 91
Potassium: 3.7 mEq/L (ref 3.5–5.1)
Sodium: 141 (ref 137–147)

## 2021-04-13 LAB — HEMOGLOBIN A1C: Hemoglobin A1C: 12.5

## 2021-04-13 LAB — VITAMIN D 25 HYDROXY (VIT D DEFICIENCY, FRACTURES): Vit D, 25-Hydroxy: 12.1

## 2021-04-13 LAB — COMPREHENSIVE METABOLIC PANEL
Albumin: 3.8 (ref 3.5–5.0)
Calcium: 8.8 (ref 8.7–10.7)
eGFR: 12

## 2021-04-20 ENCOUNTER — Telehealth: Payer: Self-pay | Admitting: Pulmonary Disease

## 2021-04-20 DIAGNOSIS — G4733 Obstructive sleep apnea (adult) (pediatric): Secondary | ICD-10-CM

## 2021-04-21 NOTE — Telephone Encounter (Signed)
Called and spoke with Melissa. She stated that they have received the order for the patient's bipap. However, based on the diagnosis used (OSA) and the test results, he does not qualify for a bipap ST. In order to qualify, the diagnosis would need to be changed to either central or complex sleep apnea.  ? ?She wanted RA to review the qualifications from the [employer] booklet that each pods. I have made copies of the information she is referring to.  ? ?RA, can you please advise? Thanks!  ?

## 2021-04-21 NOTE — Telephone Encounter (Signed)
Called Melissa but she did not answer. Left message for her to call back. Will need to see if we can change the order to a regular bipap.  ?

## 2021-04-28 NOTE — Telephone Encounter (Signed)
Dr. Elsworth Soho, for the max ipap on the original order you wanted 26cm. Per Melissa, the bipap machine will go to 25cm. Are you ok with [place] adjusting the order to 25cm?  ?

## 2021-04-28 NOTE — Telephone Encounter (Signed)
New order has been placed and sent to [facility].  ?

## 2021-04-28 NOTE — Telephone Encounter (Signed)
Melissa states needs new order for BIPAP machine that only goes up to 25. Melissa phone number is [phone]. ?

## 2021-05-09 ENCOUNTER — Telehealth: Payer: 59 | Admitting: Physician Assistant

## 2021-05-09 DIAGNOSIS — R197 Diarrhea, unspecified: Secondary | ICD-10-CM

## 2021-05-09 MED ORDER — ONDANSETRON HCL 4 MG PO TABS: 4.0000 mg | ORAL_TABLET | Freq: Three times a day (TID) | ORAL | 0 refills | Status: DC | PRN

## 2021-05-09 NOTE — Progress Notes (Signed)
We are sorry that you are not feeling well.  Here is how we plan to help! ? ?Based on what you have shared with me it looks like you have Acute Infectious Diarrhea. ? ?Most cases of acute diarrhea are due to infections with virus and bacteria and are self-limited conditions lasting less than 14 days. ? ?For your symptoms you may take Imodium 2 mg tablets that are over the counter at your local pharmacy. Take two tablet now and then one after each loose stool up to 6 a day.  ?Antibiotics are not needed for most people with diarrhea. ? ?Optional: Zofran 4 mg 1 tablet every 8 hours as needed for nausea and vomiting ? ? ?HOME CARE ?We recommend changing your diet to help with your symptoms for the next few days. ?Drink plenty of fluids that contain water salt and sugar. Sports drinks such as Gatorade may help.  ?You may try broths, soups, bananas, applesauce, soft breads, mashed potatoes or crackers.  ?You are considered infectious for as long as the diarrhea continues. Hand washing or use of alcohol based hand sanitizers is recommend. ?It is best to stay out of work or school until your symptoms stop.  ? ?GET HELP RIGHT AWAY ?If you have dark yellow colored urine or do not pass urine frequently you should drink more fluids.   ?If your symptoms worsen  ?If you feel like you are going to pass out (faint) ?You have a new problem ? ?MAKE SURE YOU  ?Understand these instructions. ?Will watch your condition. ?Will get help right away if you are not doing well or get worse. ? ?Thank you for choosing an e-visit. ? ?Your e-visit answers were reviewed by a board certified advanced clinical practitioner to complete your personal care plan. Depending upon the condition, your plan could have included both over the counter or prescription medications. ? ?Please review your pharmacy choice. Make sure the pharmacy is open so you can pick up prescription now. If there is a problem, you may contact your provider through [employer] and have the prescription routed to another pharmacy.  Your safety is important to [place]. If you have drug allergies check your prescription carefully.  ? ?For the next 24 hours you can use MyChart to ask questions about today's visit, request a non-urgent call back, or ask for a work or school excuse. ?You will get an email in the next two days asking about your experience. I hope that your e-visit has been valuable and will speed your recovery. ? ? ?I provided 5 minutes of non face-to-face time during this encounter for chart review and documentation.  ? ?

## 2021-05-25 ENCOUNTER — Telehealth: Payer: Self-pay | Admitting: Pulmonary Disease

## 2021-05-26 NOTE — Telephone Encounter (Signed)
Called patient but he did not answer. I have sent a community message to [employer] to see if anything else is needed for his bipap machine.  ?

## 2021-05-27 NOTE — Telephone Encounter (Signed)
Frank Coffey; [name], [name]; [name], [name] ?Hello,  ? ?I pulled the order and docs. Created new order and sent to intake review for processing. If anything else is needed we will let you know.  ? ?Thank you,  ? ?Brad New   ?  ?   ? ?

## 2021-05-30 DIAGNOSIS — Z0289 Encounter for other administrative examinations: Secondary | ICD-10-CM

## 2021-06-01 ENCOUNTER — Other Ambulatory Visit: Payer: Self-pay | Admitting: Internal Medicine

## 2021-06-04 ENCOUNTER — Telehealth: Payer: 59 | Admitting: Nurse Practitioner

## 2021-06-04 DIAGNOSIS — J029 Acute pharyngitis, unspecified: Secondary | ICD-10-CM | POA: Diagnosis not present

## 2021-06-04 MED ORDER — HEXYLRESORCINOL 2 MG MT LOZG: LOZENGE | OROMUCOSAL | 0 refills | Status: DC

## 2021-06-04 NOTE — Progress Notes (Signed)
E-Visit for Sore Throat  We are sorry that you are not feeling well.  Here is how we plan to help!  Your symptoms indicate a likely viral infection (Pharyngitis).   Pharyngitis is inflammation in the back of the throat which can cause a sore throat, scratchiness and sometimes difficulty swallowing.   Pharyngitis is typically caused by a respiratory virus and will just run its course.  Please keep in mind that your symptoms could last up to 10 days.  For throat pain, we recommend over the counter oral pain relief medications such as acetaminophen or aspirin, or anti-inflammatory medications such as ibuprofen or naproxen sodium.  Topical treatments such as oral throat lozenges or sprays may be used as needed.  Avoid close contact with loved ones, especially the very young and elderly.  Remember to wash your hands thoroughly throughout the day as this is the number one way to prevent the spread of infection and wipe down door knobs and counters with disinfectant.  I have sent lozenges for your sore throat to the pharmacy. You can likely purchase similar lozenges such as  these over the counter for cost effectiveness.   After careful review of your answers, I would not recommend an antibiotic for your condition.  Antibiotics should not be used to treat conditions that we suspect are caused by viruses like the virus that causes the common cold or flu. However, some people can have Strep with atypical symptoms. You may need formal testing in clinic or office to confirm if your symptoms continue or worsen.  Providers prescribe antibiotics to treat infections caused by bacteria. Antibiotics are very powerful in treating bacterial infections when they are used properly.  To maintain their effectiveness, they should be used only when necessary.  Overuse of antibiotics has resulted in the development of super bugs that are resistant to treatment!    Home Care: Only take medications as instructed by your medical  team. Do not drink alcohol while taking these medications. A steam or ultrasonic humidifier can help congestion.  You can place a towel over your head and breathe in the steam from hot water coming from a faucet. Avoid close contacts especially the very young and the elderly. Cover your mouth when you cough or sneeze. Always remember to wash your hands.  Get Help Right Away If: You develop worsening fever or throat pain. You develop a severe head ache or visual changes. Your symptoms persist after you have completed your treatment plan.  Make sure you Understand these instructions. Will watch your condition. Will get help right away if you are not doing well or get worse.   Thank you for choosing an e-visit.  Your e-visit answers were reviewed by a board certified advanced clinical practitioner to complete your personal care plan. Depending upon the condition, your plan could have included both over the counter or prescription medications.  Please review your pharmacy choice. Make sure the pharmacy is open so you can pick up prescription now. If there is a problem, you may contact your provider through [employer] and have the prescription routed to another pharmacy.  Your safety is important to [place]. If you have drug allergies check your prescription carefully.   For the next 24 hours you can use MyChart to ask questions about today's visit, request a non-urgent call back, or ask for a work or school excuse. You will get an email in the next two days asking about your experience. I hope that your e-visit  has been valuable and will speed your recovery.

## 2021-06-04 NOTE — Progress Notes (Signed)
I have spent 5 minutes in review of e-visit questionnaire, review and updating patient chart, medical decision making and response to patient.  ° °Alajiah Dutkiewicz W Xan Ingraham, NP ° °  °

## 2021-06-07 ENCOUNTER — Telehealth: Payer: 59 | Admitting: Nurse Practitioner

## 2021-06-07 DIAGNOSIS — R051 Acute cough: Secondary | ICD-10-CM

## 2021-06-07 DIAGNOSIS — J0140 Acute pansinusitis, unspecified: Secondary | ICD-10-CM

## 2021-06-07 MED ORDER — AMOXICILLIN-POT CLAVULANATE 875-125 MG PO TABS: 1.0000 | ORAL_TABLET | Freq: Two times a day (BID) | ORAL | 0 refills | Status: AC

## 2021-06-07 MED ORDER — BENZONATATE 100 MG PO CAPS: 100.0000 mg | ORAL_CAPSULE | Freq: Three times a day (TID) | ORAL | 0 refills | Status: DC | PRN

## 2021-06-07 NOTE — Progress Notes (Signed)
E-Visit for Sinus Problems  We are sorry that you are not feeling well.  Here is how we plan to help!  Based on what you have shared with me it looks like you have sinusitis.  Sinusitis is inflammation and infection in the sinus cavities of the head.  Based on your presentation I believe you most likely have Acute Bacterial Sinusitis.  This is an infection caused by bacteria and is treated with antibiotics. I have prescribed Augmentin '875mg'$ /'125mg'$  one tablet twice daily with food, for 10 days. You may use an oral decongestant such as plain Mucinex. Saline nasal spray help and can safely be used as often as needed for congestion.   You can also use sore throat spray, try warm salt water gargles or Cepacol lozenges for your sore throat.   We will call something in for cough as well that will help the sore throat that is likely from post nasal drainage and coughing.   Meds ordered this encounter  Medications   amoxicillin-clavulanate (AUGMENTIN) 875-125 MG tablet    Sig: Take 1 tablet by mouth 2 (two) times daily for 10 days. Take with food    Dispense:  20 tablet    Refill:  0   benzonatate (TESSALON) 100 MG capsule    Sig: Take 1 capsule (100 mg total) by mouth 3 (three) times daily as needed for cough.    Dispense:  30 capsule    Refill:  0      If you develop worsening sinus pain, fever or notice severe headache and vision changes, or if symptoms are not better after completion of antibiotic, please schedule an appointment with a health care provider.    Sinus infections are not as easily transmitted as other respiratory infection, however we still recommend that you avoid close contact with loved ones, especially the very young and elderly.  Remember to wash your hands thoroughly throughout the day as this is the number one way to prevent the spread of infection!  Home Care: Only take medications as instructed by your medical team. Complete the entire course of an antibiotic. Do not  take these medications with alcohol. A steam or ultrasonic humidifier can help congestion.  You can place a towel over your head and breathe in the steam from hot water coming from a faucet. Avoid close contacts especially the very young and the elderly. Cover your mouth when you cough or sneeze. Always remember to wash your hands.  Get Help Right Away If: You develop worsening fever or sinus pain. You develop a severe head ache or visual changes. Your symptoms persist after you have completed your treatment plan.  Make sure you Understand these instructions. Will watch your condition. Will get help right away if you are not doing well or get worse.  Thank you for choosing an e-visit.  Your e-visit answers were reviewed by a board certified advanced clinical practitioner to complete your personal care plan. Depending upon the condition, your plan could have included both over the counter or prescription medications.  Please review your pharmacy choice. Make sure the pharmacy is open so you can pick up prescription now. If there is a problem, you may contact your provider through [employer] and have the prescription routed to another pharmacy.  Your safety is important to [place]. If you have drug allergies check your prescription carefully.   For the next 24 hours you can use MyChart to ask questions about today's visit, request a non-urgent call back,  or ask for a work or school excuse. You will get an email in the next two days asking about your experience. I hope that your e-visit has been valuable and will speed your recovery.   I spent approximately 7 minutes reviewing the patient's history, current symptoms and coordinating their plan of care today.

## 2021-06-09 ENCOUNTER — Encounter: Payer: Self-pay | Admitting: Family Medicine

## 2021-06-09 ENCOUNTER — Ambulatory Visit (INDEPENDENT_AMBULATORY_CARE_PROVIDER_SITE_OTHER): Payer: 59 | Admitting: Family Medicine

## 2021-06-09 VITALS — BP 154/92 | HR 77 | Temp 97.6°F | Ht 68.0 in | Wt 376.2 lb

## 2021-06-09 DIAGNOSIS — R058 Other specified cough: Secondary | ICD-10-CM

## 2021-06-09 MED ORDER — ALBUTEROL SULFATE HFA 108 (90 BASE) MCG/ACT IN AERS: 1.0000 | INHALATION_SPRAY | Freq: Four times a day (QID) | RESPIRATORY_TRACT | 2 refills | Status: DC | PRN

## 2021-06-09 MED ORDER — PREDNISONE 10 MG PO TABS: 10.0000 mg | ORAL_TABLET | Freq: Two times a day (BID) | ORAL | 0 refills | Status: AC

## 2021-06-09 NOTE — Progress Notes (Signed)
Established Patient Office Visit  Subjective   Patient ID: Frank Coffey, male    DOB: 08-21-1980  Age: 41 y.o. MRN: [id number]  Chief Complaint  Patient presents with   Cough    Cough, very restless coughing more at night.     Cough Associated symptoms include wheezing. Pertinent negatives include no chest pain, eye redness, myalgias, rash or shortness of breath.  for follow-up of viral URI that is likely in the with a dry nonproductive cough associated with wheezing.  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  At this point he denies malaise, fatigue, fever, chills, sputum production and feels well otherwise.  History of nonischemic cardiomyopathy.  Blood pressure controlled.  He says that he was late taking his blood pressure medicines today.  He has had no chest pain or shortness of breath.  Review of Systems  Constitutional: Negative.   HENT: Negative.    Eyes:  Negative for blurred vision, discharge and redness.  Respiratory:  Positive for cough and wheezing. Negative for shortness of breath.   Cardiovascular: Negative.  Negative for chest pain, palpitations and leg swelling.  Gastrointestinal:  Negative for abdominal pain.  Genitourinary: Negative.   Musculoskeletal: Negative.  Negative for myalgias.  Skin:  Negative for rash.  Neurological:  Negative for tingling, loss of consciousness and weakness.  Endo/Heme/Allergies:  Negative for polydipsia.     Objective:     BP (!) 154/92 (BP Location: Right Arm, Patient Position: Sitting, Cuff Size: Large)   Pulse 77   Temp 97.6 F (36.4 C) (Temporal)   Ht '5\' 8"'$  (1.727 m)   Wt (!) 376 lb 3.2 oz (170.6 kg)   SpO2 97%   BMI 57.20 kg/m    Physical Exam Constitutional:      General: He is not in acute distress.    Appearance: Normal appearance. He is not ill-appearing, toxic-appearing or diaphoretic.  HENT:     Head: Normocephalic and atraumatic.     Right Ear: External ear  normal.     Left Ear: External ear normal.     Mouth/Throat:     Mouth: Mucous membranes are moist.     Pharynx: Oropharynx is clear. No oropharyngeal exudate or posterior oropharyngeal erythema.  Eyes:     General: No scleral icterus.       Right eye: No discharge.        Left eye: No discharge.     Extraocular Movements: Extraocular movements intact.     Conjunctiva/sclera: Conjunctivae normal.     Pupils: Pupils are equal, round, and reactive to light.  Cardiovascular:     Rate and Rhythm: Normal rate and regular rhythm.  Pulmonary:     Effort: Pulmonary effort is normal. No respiratory distress.     Breath sounds: Normal breath sounds. No wheezing, rhonchi or rales.  Abdominal:     General: Bowel sounds are normal.     Tenderness: There is no abdominal tenderness. There is no guarding.  Musculoskeletal:     Cervical back: No rigidity or tenderness.     Right lower leg: No edema.     Left lower leg: No edema.  Skin:    General: Skin is warm and dry.  Neurological:     Mental Status: He is alert and oriented to person, place, and time.  Psychiatric:        Mood and Affect: Mood normal.  Behavior: Behavior normal.     No results found for any visits on 06/09/21.    The ASCVD Risk score (Arnett DK, et al., 2019) failed to calculate for the following reasons:   Cannot find a previous HDL lab   Cannot find a previous total cholesterol lab    Assessment & Plan:   Problem List Items Addressed This Visit   None Visit Diagnoses     Post-viral cough syndrome    -  Primary   Relevant Medications   predniSONE (DELTASONE) 10 MG tablet   albuterol (VENTOLIN HFA) 108 (90 Base) MCG/ACT inhaler       Return in about 1 week (around 06/16/2021), or if symptoms worsen or fail to improve.    Libby Maw, MD

## 2021-06-10 ENCOUNTER — Encounter: Payer: 59 | Admitting: Family Medicine

## 2021-06-15 ENCOUNTER — Telehealth: Payer: 59 | Admitting: Physician Assistant

## 2021-06-15 DIAGNOSIS — R053 Chronic cough: Secondary | ICD-10-CM

## 2021-06-15 LAB — HEMOGLOBIN A1C: Hemoglobin A1C: 12

## 2021-06-16 NOTE — Progress Notes (Signed)
Because of ongoing symptoms despite multiple visits and recent steroids, you will need to follow-up with your PCP office for further management, or be seen in-person at a local urgent care.   NOTE: There will be NO CHARGE for this eVisit   If you are having a true medical emergency please call 911.      For an urgent face to face visit, [facility] has six urgent care centers for your convenience:     [facility] [place] Get Driving Directions [phone] [place], [place] [address]    [facility]) Get Driving Directions [phone] [place], [place] [address]  [facility] [place] [facility]) Get Driving Directions [phone] [address] [place],  [place]  [address]  [facility] Get Driving Directions [phone] [address] [place], [place], [place] [address]   [facility] Get Driving Directions  [phone] [address] [place], [place] [address]   [facility] [place] Get Driving Directions [phone] [address] [place], [place] [address]  Your MyChart E-visit questionnaire answers were reviewed by a board certified advanced clinical practitioner to complete your personal care plan based on your specific symptoms.  Thank you for using e-Visits.

## 2021-06-30 ENCOUNTER — Telehealth: Payer: 59 | Admitting: Physician Assistant

## 2021-06-30 DIAGNOSIS — J9801 Acute bronchospasm: Secondary | ICD-10-CM

## 2021-06-30 DIAGNOSIS — J029 Acute pharyngitis, unspecified: Secondary | ICD-10-CM

## 2021-06-30 MED ORDER — PREDNISONE 10 MG PO TABS: ORAL_TABLET | ORAL | 0 refills | Status: AC

## 2021-06-30 NOTE — Progress Notes (Signed)
I have spent 5 minutes in review of e-visit questionnaire, review and updating patient chart, medical decision making and response to patient.   Gabriel Conry Cody Phuong Hillary, PA-C    

## 2021-06-30 NOTE — Progress Notes (Signed)
We are sorry that you are not feeling well.  Here is how we plan to help!  Based on your presentation I believe you most likely have a post-viral cough made worse by spasms in the upper airways (bronchospasm). The continued cough is likely the culprit for the sore throat itself based on previous e-visit messages, etc. I will give you a course of prednisone -- taper -- to take over the next several days to fully calm this down. Again if this is not resolving things or you note any new/worsening symptoms, you will have to be re-evaluated in person.    From your responses in the eVisit questionnaire you describe inflammation in the upper respiratory tract which is causing a significant cough.  This is commonly called Bronchitis and has four common causes:   Allergies Viral Infections Acid Reflux Bacterial Infection Allergies, viruses and acid reflux are treated by controlling symptoms or eliminating the cause. An example might be a cough caused by taking certain blood pressure medications. You stop the cough by changing the medication. Another example might be a cough caused by acid reflux. Controlling the reflux helps control the cough.  USE OF BRONCHODILATOR ("RESCUE") INHALERS: There is a risk from using your bronchodilator too frequently.  The risk is that over-reliance on a medication which only relaxes the muscles surrounding the breathing tubes can reduce the effectiveness of medications prescribed to reduce swelling and congestion of the tubes themselves.  Although you feel brief relief from the bronchodilator inhaler, your asthma may actually be worsening with the tubes becoming more swollen and filled with mucus.  This can delay other crucial treatments, such as oral steroid medications. If you need to use a bronchodilator inhaler daily, several times per day, you should discuss this with your provider.  There are probably better treatments that could be used to keep your asthma under control.      HOME CARE Only take medications as instructed by your medical team. Complete the entire course of an antibiotic. Drink plenty of fluids and get plenty of rest. Avoid close contacts especially the very young and the elderly Cover your mouth if you cough or cough into your sleeve. Always remember to wash your hands A steam or ultrasonic humidifier can help congestion.   GET HELP RIGHT AWAY IF: You develop worsening fever. You become short of breath You cough up blood. Your symptoms persist after you have completed your treatment plan MAKE SURE YOU  Understand these instructions. Will watch your condition. Will get help right away if you are not doing well or get worse.    Thank you for choosing an e-visit.  Your e-visit answers were reviewed by a board certified advanced clinical practitioner to complete your personal care plan. Depending upon the condition, your plan could have included both over the counter or prescription medications.  Please review your pharmacy choice. Make sure the pharmacy is open so you can pick up prescription now. If there is a problem, you may contact your provider through [employer] and have the prescription routed to another pharmacy.  Your safety is important to [place]. If you have drug allergies check your prescription carefully.   For the next 24 hours you can use MyChart to ask questions about today's visit, request a non-urgent call back, or ask for a work or school excuse. You will get an email in the next two days asking about your experience. I hope that your e-visit has been valuable and will speed your  recovery.

## 2021-07-09 ENCOUNTER — Other Ambulatory Visit: Payer: Self-pay | Admitting: Internal Medicine

## 2021-07-12 ENCOUNTER — Encounter (INDEPENDENT_AMBULATORY_CARE_PROVIDER_SITE_OTHER): Payer: Self-pay | Admitting: Family Medicine

## 2021-07-12 ENCOUNTER — Ambulatory Visit (INDEPENDENT_AMBULATORY_CARE_PROVIDER_SITE_OTHER): Payer: 59 | Admitting: Family Medicine

## 2021-07-12 VITALS — BP 127/86 | HR 86 | Temp 98.1°F | Ht 69.0 in | Wt 379.0 lb

## 2021-07-12 DIAGNOSIS — R5383 Other fatigue: Secondary | ICD-10-CM | POA: Diagnosis not present

## 2021-07-12 DIAGNOSIS — R0602 Shortness of breath: Secondary | ICD-10-CM

## 2021-07-12 DIAGNOSIS — N183 Chronic kidney disease, stage 3 unspecified: Secondary | ICD-10-CM

## 2021-07-12 DIAGNOSIS — Z6841 Body Mass Index (BMI) 40.0 and over, adult: Secondary | ICD-10-CM

## 2021-07-12 DIAGNOSIS — G4733 Obstructive sleep apnea (adult) (pediatric): Secondary | ICD-10-CM | POA: Diagnosis not present

## 2021-07-12 DIAGNOSIS — Z1331 Encounter for screening for depression: Secondary | ICD-10-CM

## 2021-07-12 DIAGNOSIS — Z9189 Other specified personal risk factors, not elsewhere classified: Secondary | ICD-10-CM

## 2021-07-12 DIAGNOSIS — I5022 Chronic systolic (congestive) heart failure: Secondary | ICD-10-CM

## 2021-07-12 DIAGNOSIS — N184 Chronic kidney disease, stage 4 (severe): Secondary | ICD-10-CM

## 2021-07-13 LAB — LIPID PANEL WITH LDL/HDL RATIO
Cholesterol, Total: 181 mg/dL (ref 100–199)
HDL: 41 mg/dL (ref >39)
LDL Chol Calc (NIH): 123 mg/dL — ABNORMAL HIGH (ref 0–99)
LDL/HDL Ratio: 3 ratio (ref 0.0–3.6)
Triglycerides: 92 mg/dL (ref 0–149)
VLDL Cholesterol Cal: 17 mg/dL (ref 5–40)

## 2021-07-13 LAB — COMPREHENSIVE METABOLIC PANEL
ALT: 25 IU/L (ref 0–44)
AST: 24 IU/L (ref 0–40)
Albumin/Globulin Ratio: 1.3 (ref 1.2–2.2)
Albumin: 4 g/dL (ref 4.0–5.0)
Alkaline Phosphatase: 108 IU/L (ref 44–121)
BUN/Creatinine Ratio: 11 (ref 9–20)
BUN: 65 mg/dL — ABNORMAL HIGH (ref 6–24)
Bilirubin Total: 0.4 mg/dL (ref 0.0–1.2)
CO2: 26 mmol/L (ref 20–29)
Calcium: 8.3 mg/dL — ABNORMAL LOW (ref 8.7–10.2)
Chloride: 99 mmol/L (ref 96–106)
Creatinine, Ser: 5.69 mg/dL — ABNORMAL HIGH (ref 0.76–1.27)
Globulin, Total: 3.2 g/dL (ref 1.5–4.5)
Glucose: 91 mg/dL (ref 70–99)
Potassium: 4.2 mmol/L (ref 3.5–5.2)
Sodium: 142 mmol/L (ref 134–144)
Total Protein: 7.2 g/dL (ref 6.0–8.5)
eGFR: 12 mL/min/{1.73_m2} — ABNORMAL LOW (ref >59)

## 2021-07-13 LAB — CBC WITH DIFFERENTIAL/PLATELET
Basophils Absolute: 0 10*3/uL (ref 0.0–0.2)
Basos: 0 %
EOS (ABSOLUTE): 0.3 10*3/uL (ref 0.0–0.4)
Eos: 3 %
Hematocrit: 41.6 % (ref 37.5–51.0)
Hemoglobin: 12.4 g/dL — ABNORMAL LOW (ref 13.0–17.7)
Immature Grans (Abs): 0 10*3/uL (ref 0.0–0.1)
Immature Granulocytes: 0 %
Lymphocytes Absolute: 1.1 10*3/uL (ref 0.7–3.1)
Lymphs: 11 %
MCH: 22.8 pg — ABNORMAL LOW (ref 26.6–33.0)
MCHC: 29.8 g/dL — ABNORMAL LOW (ref 31.5–35.7)
MCV: 76 fL — ABNORMAL LOW (ref 79–97)
Monocytes Absolute: 0.8 10*3/uL (ref 0.1–0.9)
Monocytes: 8 %
Neutrophils Absolute: 8 10*3/uL — ABNORMAL HIGH (ref 1.4–7.0)
Neutrophils: 78 %
Platelets: 278 10*3/uL (ref 150–450)
RBC: 5.45 x10E6/uL (ref 4.14–5.80)
RDW: 20.7 % — ABNORMAL HIGH (ref 11.6–15.4)
WBC: 10.3 10*3/uL (ref 3.4–10.8)

## 2021-07-13 LAB — HEMOGLOBIN A1C
Est. average glucose Bld gHb Est-mCnc: 123 mg/dL
Hgb A1c MFr Bld: 5.9 % — ABNORMAL HIGH (ref 4.8–5.6)

## 2021-07-13 LAB — FOLATE: Folate: 2.3 ng/mL — ABNORMAL LOW (ref >3.0)

## 2021-07-13 LAB — TSH: TSH: 3.51 u[IU]/mL (ref 0.450–4.500)

## 2021-07-13 LAB — VITAMIN D 25 HYDROXY (VIT D DEFICIENCY, FRACTURES): Vit D, 25-Hydroxy: 20.5 ng/mL — ABNORMAL LOW (ref 30.0–100.0)

## 2021-07-13 LAB — T4, FREE: Free T4: 1.17 ng/dL (ref 0.82–1.77)

## 2021-07-13 LAB — INSULIN, RANDOM: INSULIN: 16.2 u[IU]/mL (ref 2.6–24.9)

## 2021-07-13 LAB — VITAMIN B12: Vitamin B-12: 430 pg/mL (ref 232–1245)

## 2021-07-15 NOTE — Progress Notes (Signed)
Chief Complaint:   OBESITY Frank Coffey (MR# [id number]) is a 41 y.o. male who presents for evaluation and treatment of obesity and related comorbidities. Current BMI is Body mass index is 55.97 kg/m. Frank Coffey has been struggling with his weight for many years and has been unsuccessful in either losing weight, maintaining weight loss, or reaching his healthy weight goal.  Frank Coffey is currently in the action stage of change and ready to dedicate time achieving and maintaining a healthier weight. Frank Coffey is interested in becoming our patient and working on intensive lifestyle modifications including (but not limited to) diet and exercise for weight loss.  Frank Coffey was referred by PCP, Frank Lacy, NP. He is a Chartered certified accountant for [employer]. Frank Coffey lives with wife, Lupe Carney and 2 sons ages 15 and 52. No one really cooks in the family and Frank Coffey eats out a lot. He eats a lot of pizza and prepared meals that he pops in the oven. [employer] and [employer] is mostly where he eats out. In the past, Frank Coffey lost weight through walking 2 miles a day and eating at home more.  Whitt's habits were reviewed today and are as follows: His family eats meals together, he thinks his family will eat healthier with him, his desired weight loss is 79 lbs, he has been heavy most of his life, his heaviest weight ever was 450 pounds, he has significant food cravings issues, he skips meals frequently, he is frequently drinking liquids with calories, he frequently makes poor food choices, he has problems with excessive hunger, he frequently eats larger portions than normal, and he struggles with emotional eating.  Depression Screen Segundo's Food and Mood (modified PHQ-9) score was 13.     07/12/2021    7:18 AM  Depression screen PHQ 2/9  Decreased Interest 1  Down, Depressed, Hopeless 2  PHQ - 2 Score 3  Altered sleeping 3  Tired, decreased energy 3  Change in appetite 2  Feeling bad or failure about yourself  1   Trouble concentrating 0  Moving slowly or fidgety/restless 1  Suicidal thoughts 0  PHQ-9 Score 13  Difficult doing work/chores Somewhat difficult   Subjective:   1. Other fatigue Frank Coffey admits to daytime somnolence and admits to waking up still tired. Patient has a history of symptoms of daytime fatigue and morning fatigue. Frank Coffey generally gets 3 hours of sleep per night, and states that he has poor sleep quality. Snoring is present. Apneic episodes are present. Epworth Sleepiness Score is elevated at 13. Frank Coffey has known OSA. He denies depression or emotional eating, despite slightly elevated PHQ of 13.  2. SOB (shortness of breath) on exertion Frank Coffey notes increasing shortness of breath with exercising and seems to be worsening over time with weight gain. He notes getting out of breath sooner with activity than he used to. This has gotten worse recently. Frank Coffey denies shortness of breath at rest or orthopnea.  3. Chronic systolic CHF (congestive heart failure) ([facility]) Managed by cardiology, whom Frank Coffey sees annually. OV notes reviewed with Frank Coffey. Frank Coffey is limited to 2 liters of water daily. He is asymptomatic now and without concerns. Medication: Coreg, Norvasc, hydralazine, Imdur, Demadex  4. Stage 3 chronic kidney disease, unspecified whether stage 3a or 3b CKD (HCC) Frank Coffey's GFR 3 months ago was 12. He is managed by [facility]. His serum creatinine was 5.6. Frank Coffey doesn't restrict protein and states he was never told he needs to.  5. OSA (obstructive sleep  apnea) [name] wears a Bi-Pap. He just got it last week. He sees Dr. Elsworth Soho of pulmonology/sleep doc. Frank Coffey finds it uncomfortable and doesn't feel rested.  6. At risk of diabetes mellitus Frank Coffey is at higher than average risk for developing diabetes due to current lifestyle habits and past medication history.  Assessment/Plan:   Orders Placed This Encounter  Procedures   Vitamin B12   CBC with Differential/Platelet   Comprehensive metabolic panel    Folate   Hemoglobin A1c   TSH   Insulin, random   VITAMIN D 25 Hydroxy (Vit-D Deficiency, Fractures)   Lipid Panel With LDL/HDL Ratio   T4, free   EKG 12-Lead    There are no discontinued medications.   No orders of the defined types were placed in this encounter.    1. Other fatigue Frank Coffey does feel that his weight is causing his energy to be lower than it should be. Fatigue may be related to obesity, depression or many other causes. Labs will be ordered, and in the meanwhile, Frank Coffey will focus on self care including making healthy food choices, increasing physical activity and focusing on stress reduction. Obtain fasting labs today.  - EKG 12-Lead - Vitamin B12 - CBC with Differential/Platelet - Hemoglobin A1c - TSH - VITAMIN D 25 Hydroxy (Vit-D Deficiency, Fractures) - T4, free  2. SOB (shortness of breath) on exertion Frank Coffey does feel that he gets out of breath more easily that he used to when he exercises. Frank Coffey's shortness of breath appears to be obesity related and exercise induced. He has agreed to work on weight loss and gradually increase exercise to treat his exercise induced shortness of breath. Will continue to monitor closely.  3. Chronic systolic CHF (congestive heart failure) (HCC) Symptoms stable currently. No PNP or orthopnea. Continue management per cardiology. Obtain fasting labs today.  - Comprehensive metabolic panel - Hemoglobin A1c - Insulin, random - Lipid Panel With LDL/HDL Ratio  4. Stage 3 chronic kidney disease, unspecified whether stage 3a or 3b CKD (Frank Coffey) Frank Coffey encouraged to ask renal team what his restriction is for protein per day. Chronic management per PCP and Nephrology. Obtain fasting labs today.  - CBC with Differential/Platelet - Comprehensive metabolic panel - Folate - Hemoglobin A1c - Insulin, random - Lipid Panel With LDL/HDL Ratio  5. OSA (obstructive sleep apnea) Intensive lifestyle modifications are the first line treatment  for this issue. We discussed several lifestyle modifications today and he will continue to work on diet, exercise and weight loss efforts. We will continue to monitor. Orders and follow up as documented in patient record.  Follow up with Dr. Elsworth Soho of pulmonology for adjustments to Bi-Pap.  6. Depression screen Anddy had a positive depression screening. Depression is commonly associated with obesity and often results in emotional eating behaviors. We will monitor this closely and work on CBT to help improve the non-hunger eating patterns. Referral to Psychology may be required if no improvement is seen as he continues in our clinic.  7. At risk of diabetes mellitus - Dalvin was given diabetes prevention education and counseling today of more than 24 minutes.  - Counseled patient on pathophysiology of disease and meaning/ implication of lab results.  - Reviewed how certain foods can either stimulate or inhibit insulin release, and subsequently affect hunger pathways  - Importance of following a healthy meal plan with limiting amounts of simple carbohydrates discussed with patient - Effects of regular aerobic exercise on blood sugar regulation reviewed and encouraged an eventual  goal of 30 min 5d/week or more as a minimum.  - Briefly discussed treatment options, which always include dietary and lifestyle modification as first line.   - Handouts provided at patient's desire and/or told to go online to the American Diabetes Association website for further information.  8. 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 I recommend Ferrel begin the structured treatment plan as follows:  He has agreed to keeping a food journal and adhering to recommended goals of 1500-1600 calories and 60 grams protein max.   Handout:  Eating Out Guide; [facility]. I recommend 6-8 servings of fruits and vegetables per day and 4-5 servings of protein per day.  Exercise goals:  As is    Behavioral modification strategies: keeping a strict food journal.  He was informed of the importance of frequent follow-up visits to maximize his success with intensive lifestyle modifications for his multiple health conditions. He was informed we would discuss his lab results at his next visit unless there is a critical issue that needs to be addressed sooner. Alontae agreed to keep his next visit at the agreed upon time to discuss these results.  Objective:   Blood pressure 127/86, pulse 86, temperature 98.1 F (36.7 C), height '5\' 9"'$  (1.753 m), weight (!) 379 lb (171.9 kg), SpO2 93 %. Body mass index is 55.97 kg/m.  EKG: Normal sinus rhythm, rate 82 (Abnormal).  Indirect Calorimeter completed today shows a VO2 of 303 and a REE of 2088.  His calculated basal metabolic rate is 7253 thus his basal metabolic rate is worse than expected.  General: Cooperative, alert, well developed, in no acute distress. HEENT: Conjunctivae and lids unremarkable. Cardiovascular: Regular rhythm.  Lungs: Normal work of breathing. Neurologic: No focal deficits.   Lab Results  Component Value Date   CREATININE 5.69 (H) 07/12/2021   BUN 65 (H) 07/12/2021   NA 142 07/12/2021   K 4.2 07/12/2021   CL 99 07/12/2021   CO2 26 07/12/2021   Lab Results  Component Value Date   ALT 25 07/12/2021   AST 24 07/12/2021   ALKPHOS 108 07/12/2021   BILITOT 0.4 07/12/2021   Lab Results  Component Value Date   HGBA1C 5.9 (H) 07/12/2021   HGBA1C 12.0 06/15/2021   HGBA1C 12.5 04/13/2021   HGBA1C 6.0 05/29/2018   HGBA1C 5.8 (H) 11/14/2017   Lab Results  Component Value Date   INSULIN 16.2 07/12/2021   Lab Results  Component Value Date   TSH 3.510 07/12/2021   Lab Results  Component Value Date   CHOL 181 07/12/2021   HDL 41 07/12/2021   LDLCALC 123 (H)  07/12/2021   TRIG 92 07/12/2021   CHOLHDL 6 05/29/2018   Lab Results  Component Value Date   WBC 10.3 07/12/2021   HGB 12.4 (L) 07/12/2021   HCT 41.6 07/12/2021   MCV 76 (L) 07/12/2021   PLT 278 07/12/2021   Lab Results  Component Value Date   FERRITIN 94 05/02/2019    Attestation Statements:   Reviewed by clinician on day of visit: allergies, medications, problem list, medical history, surgical history, family history, social history, and previous encounter notes.  I, Kathlene November, BS, CMA, am acting as transcriptionist for [employer], DO.   I have reviewed the above documentation for accuracy and completeness,  and I agree with the above. Marjory Sneddon, D.O.  The [place] was signed into law in 2016 which includes the topic of electronic health records.  This provides immediate access to information in MyChart.  This includes consultation notes, operative notes, office notes, lab results and pathology reports.  If you have any questions about what you read please let us know at your next visit so we can discuss your concerns and take corrective action if need be.  We are right here with you.

## 2021-07-25 ENCOUNTER — Ambulatory Visit: Payer: 59 | Admitting: Pulmonary Disease

## 2021-07-26 ENCOUNTER — Ambulatory Visit (INDEPENDENT_AMBULATORY_CARE_PROVIDER_SITE_OTHER): Payer: 59 | Admitting: Family Medicine

## 2021-08-02 ENCOUNTER — Telehealth: Payer: 59 | Admitting: Physician Assistant

## 2021-08-02 DIAGNOSIS — M109 Gout, unspecified: Secondary | ICD-10-CM | POA: Diagnosis not present

## 2021-08-02 MED ORDER — PREDNISONE 20 MG PO TABS: 40.0000 mg | ORAL_TABLET | Freq: Every day | ORAL | 0 refills | Status: AC

## 2021-08-02 NOTE — Progress Notes (Signed)
I have spent 5 minutes in review of e-visit questionnaire, review and updating patient chart, medical decision making and response to patient.   Catarino Vold Cody Rily Nickey, PA-C    

## 2021-08-02 NOTE — Progress Notes (Signed)
E-Visit for Gout Symptoms  We are sorry that you are not feeling well. We are here to help!  Based on what you shared with me it looks like you have a flare of your gout.  Gout is a form of arthritis. It can cause pain and swelling in the joints. At first, it tends to affect only 1 joint - most frequently the big toe. It happens in people who have too much uric acid in the blood. Uric acid is a chemical that is produced when the body breaks down certain foods. Uric acid can form sharp needle-like crystals that build up in the joints and cause pain. Uric acid crystals can also form inside the tubes that carry urine from the kidneys to the bladder. These crystals can turn into "kidney stones" that can cause pain and problems with the flow of urine. People with gout get sudden "flares" or attacks of severe pain, most often the big toe, ankle, or knee. Often the joint also turns red and swells. Usually, only 1 joint is affected, but some people have pain in more than 1 joint. Gout flares tend to happen more often during the night.  The pain from gout can be extreme. The pain and swelling are worst at the beginning of a gout flare. The symptoms then get better within a few days to weeks. It is not clear how the body "turns off" a gout flare.  Do not start any NEW preventative medicine until the gout has cleared completely. However, If you are already on Probenecid or Allopurinol for CHRONIC gout, you may continue taking this during an active flare up  I have prescribed Prednisone 40 mg daily for 7 days.    HOME CARE Losing weight can help relieve gout. It's not clear that following a specific diet plan will help with gout symptoms but eating a balanced diet can help improve your overall health. It can also help you lose weight, if you are overweight. In general, a healthy diet includes plenty of fruits, vegetables, whole grains, and low-fat dairy products (labelled "low fat", skim, 2%). Avoid sugar  sweetened drinks (including sodas, tea, juice and juice blends, coffee drinks and sports drinks) Limit alcohol to 1-2 drinks of beer, spirits or wine daily these can make gout flares worse. Some people with gout also have other health problems, such as heart disease, high blood pressure, kidney disease, or obesity. If you have any of these issues, it's important to work with your doctor to manage them. This can help improve your overall health and might also help with your gout.  GET HELP RIGHT AWAY IF: Your symptoms persist after you have completed your treatment plan You develop severe diarrhea You develop abnormal sensations  You develop vomiting,   You develop weakness  You develop abdominal pain  FOLLOW UP WITH YOUR PRIMARY PROVIDER IF: If your symptoms do not improve within 10 days  MAKE SURE YOU  Understand these instructions. Will watch your condition. Will get help right away if you are not doing well or get worse.  Thank you for choosing an e-visit.  Your e-visit answers were reviewed by a board certified advanced clinical practitioner to complete your personal care plan. Depending upon the condition, your plan could have included both over the counter or prescription medications.  Please review your pharmacy choice. Make sure the pharmacy is open so you can pick up prescription now. If there is a problem, you may contact your provider through [employer]  and have the prescription routed to another pharmacy.  Your safety is important to [place]. If you have drug allergies check your prescription carefully.   For the next 24 hours you can use MyChart to ask questions about today's visit, request a non-urgent call back, or ask for a work or school excuse. You will get an email in the next two days asking about your experience. I hope that your e-visit has been valuable and will speed your recovery.

## 2021-08-24 ENCOUNTER — Encounter (INDEPENDENT_AMBULATORY_CARE_PROVIDER_SITE_OTHER): Payer: Self-pay

## 2021-09-09 NOTE — Telephone Encounter (Signed)
Na

## 2021-09-28 ENCOUNTER — Telehealth: Payer: Self-pay | Admitting: Nurse Practitioner

## 2021-09-28 DIAGNOSIS — K5904 Chronic idiopathic constipation: Secondary | ICD-10-CM

## 2021-09-28 NOTE — Telephone Encounter (Signed)
Caller Name: Humzah Harty Call back phone #: [phone]  Reason for Call: Pt is currently changing insurances. His new one does not start until September 29th, however he ran out of his medication Linaclotide 2 days ago and a refill would be too expensive. Is there a temporary alternative to get him through these 2 weeks?

## 2021-09-29 MED ORDER — LINACLOTIDE 145 MCG PO CAPS: 145.0000 ug | ORAL_CAPSULE | Freq: Every day | ORAL | 0 refills | Status: DC

## 2021-09-30 ENCOUNTER — Ambulatory Visit
Admission: EM | Admit: 2021-09-30 | Discharge: 2021-09-30 | Disposition: A | Discharge status: Home / Self Care | Payer: 59 | Attending: Physician Assistant | Admitting: Physician Assistant

## 2021-09-30 ENCOUNTER — Other Ambulatory Visit: Payer: Self-pay

## 2021-09-30 ENCOUNTER — Emergency Department (HOSPITAL_COMMUNITY): Payer: Self-pay

## 2021-09-30 ENCOUNTER — Encounter (HOSPITAL_COMMUNITY): Payer: Self-pay

## 2021-09-30 ENCOUNTER — Emergency Department (HOSPITAL_COMMUNITY)
Admission: EM | Admit: 2021-09-30 | Discharge: 2021-10-01 | Disposition: A | Discharge status: Home / Self Care | Payer: Self-pay | Attending: Emergency Medicine | Admitting: Emergency Medicine

## 2021-09-30 DIAGNOSIS — I10 Essential (primary) hypertension: Secondary | ICD-10-CM

## 2021-09-30 DIAGNOSIS — Z79899 Other long term (current) drug therapy: Secondary | ICD-10-CM | POA: Insufficient documentation

## 2021-09-30 DIAGNOSIS — I129 Hypertensive chronic kidney disease with stage 1 through stage 4 chronic kidney disease, or unspecified chronic kidney disease: Secondary | ICD-10-CM | POA: Insufficient documentation

## 2021-09-30 DIAGNOSIS — I161 Hypertensive emergency: Secondary | ICD-10-CM

## 2021-09-30 DIAGNOSIS — R42 Dizziness and giddiness: Secondary | ICD-10-CM

## 2021-09-30 DIAGNOSIS — R202 Paresthesia of skin: Secondary | ICD-10-CM

## 2021-09-30 DIAGNOSIS — N189 Chronic kidney disease, unspecified: Secondary | ICD-10-CM | POA: Insufficient documentation

## 2021-09-30 DIAGNOSIS — R200 Anesthesia of skin: Secondary | ICD-10-CM

## 2021-09-30 LAB — CBC WITH DIFFERENTIAL/PLATELET
Abs Immature Granulocytes: 0.04 10*3/uL (ref 0.00–0.07)
Basophils Absolute: 0 10*3/uL (ref 0.0–0.1)
Basophils Relative: 0 %
Eosinophils Absolute: 0.2 10*3/uL (ref 0.0–0.5)
Eosinophils Relative: 2 %
HCT: 42.6 % (ref 39.0–52.0)
Hemoglobin: 12.5 g/dL — ABNORMAL LOW (ref 13.0–17.0)
Immature Granulocytes: 0 %
Lymphocytes Relative: 15 %
Lymphs Abs: 1.4 10*3/uL (ref 0.7–4.0)
MCH: 23.7 pg — ABNORMAL LOW (ref 26.0–34.0)
MCHC: 29.3 g/dL — ABNORMAL LOW (ref 30.0–36.0)
MCV: 80.8 fL (ref 80.0–100.0)
Monocytes Absolute: 1 10*3/uL (ref 0.1–1.0)
Monocytes Relative: 11 %
Neutro Abs: 6.5 10*3/uL (ref 1.7–7.7)
Neutrophils Relative %: 72 %
Platelets: 272 10*3/uL (ref 150–400)
RBC: 5.27 MIL/uL (ref 4.22–5.81)
RDW: 16.4 % — ABNORMAL HIGH (ref 11.5–15.5)
WBC: 9.2 10*3/uL (ref 4.0–10.5)
nRBC: 0 % (ref 0.0–0.2)

## 2021-09-30 LAB — BASIC METABOLIC PANEL
Anion gap: 9 (ref 5–15)
BUN: 55 mg/dL — ABNORMAL HIGH (ref 6–20)
CO2: 28 mmol/L (ref 22–32)
Calcium: 9.2 mg/dL (ref 8.9–10.3)
Chloride: 106 mmol/L (ref 98–111)
Creatinine, Ser: 6.73 mg/dL — ABNORMAL HIGH (ref 0.61–1.24)
GFR, Estimated: 10 mL/min — ABNORMAL LOW (ref >60)
Glucose, Bld: 91 mg/dL (ref 70–99)
Potassium: 3.1 mmol/L — ABNORMAL LOW (ref 3.5–5.1)
Sodium: 143 mmol/L (ref 135–145)

## 2021-09-30 MED ORDER — POTASSIUM CHLORIDE CRYS ER 20 MEQ PO TBCR
40.0000 meq | EXTENDED_RELEASE_TABLET | Freq: Once | ORAL | Status: AC
  Administered 2021-10-01: 40 meq via ORAL
  Filled 2021-09-30: qty 2

## 2021-09-30 MED ORDER — HYDRALAZINE HCL 25 MG PO TABS
100.0000 mg | ORAL_TABLET | Freq: Once | ORAL | Status: AC
  Administered 2021-09-30: 100 mg via ORAL
  Filled 2021-09-30: qty 4

## 2021-09-30 MED ORDER — ISOSORBIDE MONONITRATE ER 30 MG PO TB24
30.0000 mg | ORAL_TABLET | Freq: Once | ORAL | Status: AC
  Administered 2021-09-30: 30 mg via ORAL
  Filled 2021-09-30: qty 1

## 2021-09-30 MED ORDER — CARVEDILOL 12.5 MG PO TABS
25.0000 mg | ORAL_TABLET | Freq: Once | ORAL | Status: AC
  Administered 2021-09-30: 25 mg via ORAL
  Filled 2021-09-30: qty 2

## 2021-09-30 NOTE — ED Provider Triage Note (Signed)
Emergency Medicine Provider Triage Evaluation Note  Frank Coffey , a 41 y.o. male  was evaluated in triage.  Pt complains of elevated blood pressure.  States he has been out of his home medications since Monday.  He endorses a mild headache, intermittent left arm tingling over the past several days.  Otherwise denies difficulty with speech, paresthesias in his lower extremities, weakness.  He has history of prior TIA listed but he states he found out after he left the hospital that he ingested Marlon Pel that was laced with marijuana.  Denies shortness of breath, chest pain.  Review of Systems  Positive: As above Negative: As above  Physical Exam  BP (!) 201/124 (BP Location: Left Arm)   Pulse 71   Temp 98.5 F (36.9 C) (Oral)   Resp (!) 22   Ht '5\' 8"'$  (1.727 m)   Wt (!) 167.8 kg   SpO2 96%   BMI 56.26 kg/m  Gen:   Awake, no distress   Resp:  Normal effort  MSK:   Moves extremities without difficulty  Other:  Cranial nerves III through XII intact.  Full range of motion bilateral upper and lower extremities with 5/5 strength in extensor and flexor muscle groups.  No pronator drift.  Sensation intact and symmetrical in bilateral upper and lower extremities.  Medical Decision Making  Medically screening exam initiated at 3:08 PM.  Appropriate orders placed.  [name] was informed that the remainder of the evaluation will be completed by another provider, this initial triage assessment does not replace that evaluation, and the importance of remaining in the ED until their evaluation is complete.     Evlyn Courier, PA-C 09/30/21 1511

## 2021-09-30 NOTE — ED Notes (Signed)
Patient is being discharged from the Urgent Care and sent to the Emergency Department via EMS . Per myers, PA, patient is in need of higher level of care due to symptoms and vitals . Patient is aware and verbalizes understanding of plan of care.  Vitals:   09/30/21 1313 09/30/21 1320  BP: (!) 177/131 (!) 165/110  Pulse:    Resp:    SpO2:

## 2021-09-30 NOTE — ED Notes (Signed)
Pt wanted staff to know that he wears a bipap at night, and that if he falls asleep his oxygen will fall.

## 2021-09-30 NOTE — ED Provider Notes (Signed)
[facility] DEPT Provider Note   CSN: [id number] Arrival date & time: 09/30/21  1359     History {Add pertinent medical, surgical, social history, OB history to HPI:1} Chief Complaint  Patient presents with   Hypertension    Frank Coffey is a 41 y.o. male, hx of CKD, HTN, who presents to the ED secondary to need for his blood pressure modification.  He states that for the last few days he has had intermittent headache, left hand numbness, that has since resolved.  He states that occurred around 1130 today, and he went to the med center, and they told him to go to the ER to get further checked out.  He states that he has been out of his blood pressure medications for the last 4 days, secondary to his insurance being canceled.  He takes hydralazine 100 mg 3 times daily, carvedilol 25 mg twice daily, and isosorbide mononitrate 30 mg.  He denies any chest pain, shortness of breath.  Hypertension Associated symptoms include headaches.      Home Medications Prior to Admission medications   Medication Sig Start Date End Date Taking? Authorizing Provider  albuterol (VENTOLIN HFA) 108 (90 Base) MCG/ACT inhaler Inhale 1-2 puffs into the lungs every 6 (six) hours as needed for wheezing (cough). 06/09/21  Yes Libby Maw, MD  allopurinol (ZYLOPRIM) 300 MG tablet TAKE 1 TABLET(300 MG) BY MOUTH DAILY Patient taking differently: Take 300 mg by mouth daily. 05/31/19  Yes Elby Beck, FNP  brimonidine (ALPHAGAN) 0.2 % ophthalmic solution Place 1 drop into both eyes in the morning and at bedtime. 03/19/19  Yes [provider]  carvedilol (COREG) 25 MG tablet TAKE 2 TABLETS(50 MG) BY MOUTH TWICE DAILY Patient taking differently: Take 25 mg by mouth 2 (two) times daily. 07/11/21  Yes Chandrasekhar, Mahesh A, MD  dorzolamide-timolol (COSOPT) 22.3-6.8 MG/ML ophthalmic solution Place 1 drop into both eyes in the morning and at bedtime. 03/10/19  Yes  [provider]  EQ FIBER SUPPLEMENT PO Take 2 tablets by mouth daily.   Yes [provider]  hydrALAZINE (APRESOLINE) 100 MG tablet Take 1 tablet (100 mg total) by mouth 3 (three) times daily. 10/20/20  Yes Nche, Charlene , NP  isosorbide mononitrate (IMDUR) 30 MG 24 hr tablet Take 1 tablet (30 mg total) by mouth daily. 11/19/20 09/30/21 Yes Chandrasekhar, Mahesh A, MD  latanoprost (XALATAN) 0.005 % ophthalmic solution Place 1 drop into the left eye at bedtime. 04/14/19  Yes [provider]  linaclotide (LINZESS) 145 MCG CAPS capsule Take 1 capsule (145 mcg total) by mouth daily before breakfast. **Needs appointment before next refill given** Patient taking differently: Take 145 mcg by mouth daily. 09/29/21  Yes Nche, Charlene , NP  Potassium Chloride ER 20 MEQ TBCR Take 20 mEq by mouth daily. 11/15/20  Yes [provider]  torsemide (DEMADEX) 20 MG tablet Take 2 tablets (40 mg total) by mouth 2 (two) times daily. Patient taking differently: Take 80 mg by mouth See admin instructions. Take 80 mg by mouth twice a day- morning and evening 02/12/20 09/30/21 Yes Chandrasekhar, Mahesh A, MD  Vitamin D, Ergocalciferol, (DRISDOL) 1.25 MG (50000 UNIT) CAPS capsule Take 50,000 Units by mouth every Wednesday. 08/12/21  Yes [provider]  atorvastatin (LIPITOR) 20 MG tablet Take 1 tablet (20 mg total) by mouth daily. Patient not taking: Reported on 09/30/2021 05/29/18   Elby Beck, FNP  benzonatate (TESSALON) 100 MG capsule Take 1 capsule (  100 mg total) by mouth 3 (three) times daily as needed. Patient not taking: Reported on 09/30/2021 02/08/21   Mar Daring, PA-C  benzonatate (TESSALON) 100 MG capsule Take 1 capsule (100 mg total) by mouth 3 (three) times daily as needed for cough. Patient not taking: Reported on 07/12/2021 06/07/21   Apolonio Schneiders, FNP  Hexylresorcinol 2 MG LOZG 1 lozenge every 2 hours as needed for pain Patient not taking:  Reported on 09/30/2021 06/04/21   Gildardo Pounds, NP  omeprazole (PRILOSEC) 40 MG capsule Take 1 capsule (40 mg total) by mouth daily. Patient not taking: Reported on 09/30/2021 02/09/20 09/30/21  Mansouraty, Telford Nab., MD  ondansetron (ZOFRAN) 4 MG tablet Take 1 tablet (4 mg total) by mouth every 8 (eight) hours as needed for nausea or vomiting. Patient not taking: Reported on 09/30/2021 05/09/21   Mar Daring, PA-C      Allergies    Patient has no known allergies.    Review of Systems   Review of Systems  Neurological:  Positive for dizziness and headaches.    Physical Exam Updated Vital Signs BP (!) 197/120 (BP Location: Left Arm)   Pulse 60   Temp 98.6 F (37 C) (Oral)   Resp 16   Ht '5\' 8"'$  (1.727 m)   Wt (!) 167.8 kg   SpO2 96%   BMI 56.26 kg/m  Physical Exam Vitals and nursing note reviewed.  Constitutional:      Appearance: Normal appearance.  HENT:     Head: Normocephalic and atraumatic.     Right Ear: Tympanic membrane normal.     Left Ear: Tympanic membrane normal.     Nose: Nose normal.     Mouth/Throat:     Mouth: Mucous membranes are moist.  Eyes:     Extraocular Movements: Extraocular movements intact.     Conjunctiva/sclera: Conjunctivae normal.     Pupils: Pupils are equal, round, and reactive to light.  Cardiovascular:     Rate and Rhythm: Normal rate and regular rhythm.  Pulmonary:     Effort: Pulmonary effort is normal.     Breath sounds: Normal breath sounds.  Abdominal:     General: Abdomen is flat. Bowel sounds are normal.     Palpations: Abdomen is soft.  Musculoskeletal:        General: Normal range of motion.     Cervical back: Normal range of motion and neck supple.  Skin:    General: Skin is warm and dry.     Capillary Refill: Capillary refill takes less than 2 seconds.  Neurological:     General: No focal deficit present.     Mental Status: He is alert and oriented to person, place, and time.     Cranial Nerves: No cranial  nerve deficit.     Sensory: No sensory deficit.     Motor: No weakness.     Coordination: Coordination normal.     Gait: Gait normal.     Deep Tendon Reflexes: Reflexes normal.  Psychiatric:        Mood and Affect: Mood normal.        Thought Content: Thought content normal.     ED Results / Procedures / Treatments   Labs (all labs ordered are listed, but only abnormal results are displayed) Labs Reviewed  CBC WITH DIFFERENTIAL/PLATELET - Abnormal; Notable for the following components:      Result Value   Hemoglobin 12.5 (*)    MCH 23.7 (*)  MCHC 29.3 (*)    RDW 16.4 (*)    All other components within normal limits  BASIC METABOLIC PANEL - Abnormal; Notable for the following components:   Potassium 3.1 (*)    BUN 55 (*)    Creatinine, Ser 6.73 (*)    GFR, Estimated 10 (*)    All other components within normal limits    EKG None  Radiology CT Head Wo Contrast  Result Date: 09/30/2021 CLINICAL DATA:  Headache, new or worsening elevated blood pressure. EXAM: CT HEAD WITHOUT CONTRAST TECHNIQUE: Contiguous axial images were obtained from the base of the skull through the vertex without intravenous contrast. RADIATION DOSE REDUCTION: This exam was performed according to the departmental dose-optimization program which includes automated exposure control, adjustment of the mA and/or kV according to patient size and/or use of iterative reconstruction technique. COMPARISON:  CT examination dated November 14, 2017. FINDINGS: Brain: No evidence of acute infarction, hemorrhage, hydrocephalus, extra-axial collection or mass lesion/mass effect. Vascular: No hyperdense vessel or unexpected calcification. Skull: Normal. Negative for fracture or focal lesion. Sinuses/Orbits: Left scleral band and cataract surgery. Other: None. IMPRESSION: No acute intracranial abnormality. Electronically Signed   By: Keane Police D.O.   On: 09/30/2021 16:06    Procedures Procedures  {Document cardiac  monitor, telemetry assessment procedure when appropriate:1}  Medications Ordered in ED Medications  hydrALAZINE (APRESOLINE) tablet 100 mg (has no administration in time range)  carvedilol (COREG) tablet 25 mg (has no administration in time range)  isosorbide mononitrate (IMDUR) 24 hr tablet 30 mg (has no administration in time range)    ED Course/ Medical Decision Making/ A&P                           Medical Decision Making Risk Prescription drug management.   ***  {Document critical care time when appropriate:1} {Document review of labs and clinical decision tools ie heart score, Chads2Vasc2 etc:1}  {Document your independent review of radiology images, and any outside records:1} {Document your discussion with family members, caretakers, and with consultants:1} {Document social determinants of health affecting pt's care:1} {Document your decision making why or why not admission, treatments were needed:1} Final Clinical Impression(s) / ED Diagnoses Final diagnoses:  None    Rx / DC Orders ED Discharge Orders     None

## 2021-09-30 NOTE — ED Triage Notes (Signed)
Pt presents with left arm numbness and intermittent dizziness X 4 days.

## 2021-09-30 NOTE — ED Triage Notes (Signed)
Per EMS- Patient was at a [facility] earlier and went because he thought his blood sugar was elevated.  UC called EMS for hypertension. BP 19/110. Patient c/o feeling dizzy. Patient states that he has not had his regular meds in a while due to no insurance.

## 2021-09-30 NOTE — ED Provider Notes (Signed)
[facility]    CSN: [id number] Arrival date & time: 09/30/21  1255      History   Chief Complaint Chief Complaint  Patient presents with   Dizziness   Numbness    HPI Frank Coffey is a 41 y.o. male.   Patient here today for evaluation of left sided headache and left arm numbness/ tingling he states started 4 days ago. He also reports intermittent dizziness. He has not had chest pain or shortness of breath. He does have history of TIA. He reports he has been taking BP meds as prescribed, and did take his medication as prescribed today. PMH also significant for stage 4 CKD.  The history is provided by the patient.  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)    IBS (irritable bowel syndrome)    Obesity    OSA (obstructive sleep apnea)    Pneumonia 04/2019   with Covid   Sleep apnea     Patient Active Problem List   Diagnosis Date Noted   Morbid obesity ([place]) 02/12/2020   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   Gastroesophageal reflux disease 12/09/2019   Family history of gastric cancer 12/09/2019   Hyperlipidemia 04/30/2019   Elevated troponin 04/30/2019   Mobitz type 1 second degree atrioventricular block 11/18/2017   TIA (transient ischemic attack) 11/14/2017   Hypertensive urgency 11/14/2017   Migraine 10/02/2016   Erectile dysfunction 10/02/2016   Onychomycosis  02/22/2016   Pre-operative cardiovascular examination 07/19/2015   NICM (nonischemic cardiomyopathy) ([place]) 07/19/2015   Chronic idiopathic constipation 27/25/3664   Chronic systolic CHF (congestive heart failure) ([place]) 12/19/2012   Benign hypertension with chronic kidney disease, stage IV ([place]) 12/19/2012   Gout 12/19/2012   CKD (chronic kidney disease) stage 4, GFR 15-29 ml/min ([place]) 12/19/2012   OSA (obstructive sleep apnea) 12/19/2012    Past Surgical History:  Procedure Laterality Date   BIOPSY  02/09/2020   Procedure: BIOPSY;  Surgeon: Irving Copas., MD;  Location: [facility] ENDOSCOPY;  Service: Gastroenterology;;   CHOLECYSTECTOMY     COLONOSCOPY WITH PROPOFOL N/A 02/09/2020   Procedure: COLONOSCOPY WITH PROPOFOL;  Surgeon: Irving Copas., MD;  Location: [facility] ENDOSCOPY;  Service: Gastroenterology;  Laterality: N/A;   ESOPHAGOGASTRODUODENOSCOPY (EGD) WITH PROPOFOL N/A 02/09/2020   Procedure: ESOPHAGOGASTRODUODENOSCOPY (EGD) WITH PROPOFOL;  Surgeon: Rush Landmark Telford Nab., MD;  Location: [place];  Service: Gastroenterology;  Laterality: N/A;   POLYPECTOMY  02/09/2020   Procedure: POLYPECTOMY;  Surgeon: Rush Landmark Telford Nab., MD;  Location: [place];  Service: Gastroenterology;;   RETINAL DETACHMENT REPAIR W/ SCLERAL BUCKLE LE     Left       Home Medications    Prior to Admission medications   Medication Sig Start Date End Date Taking? Authorizing Provider  albuterol (VENTOLIN HFA) 108 (90 Base) MCG/ACT inhaler  Inhale 1-2 puffs into the lungs every 6 (six) hours as needed for wheezing (cough). Patient not taking: Reported on 07/12/2021 06/09/21   Libby Maw, MD  allopurinol (ZYLOPRIM) 300 MG tablet TAKE 1 TABLET(300 MG) BY MOUTH DAILY 05/31/19   Elby Beck, FNP  amLODipine (NORVASC) 5 MG tablet Take 5 mg by mouth daily. Patient not taking: Reported on 07/12/2021 01/27/20   [provider]  atorvastatin (LIPITOR) 20 MG tablet Take 1  tablet (20 mg total) by mouth daily. 05/29/18   Elby Beck, FNP  benzonatate (TESSALON) 100 MG capsule Take 1 capsule (100 mg total) by mouth 3 (three) times daily as needed. Patient not taking: Reported on 07/12/2021 02/08/21   Mar Daring, PA-C  benzonatate (TESSALON) 100 MG capsule Take 1 capsule (100 mg total) by mouth 3 (three) times daily as needed for cough. Patient not taking: Reported on 07/12/2021 06/07/21   Apolonio Schneiders, FNP  brimonidine [facility]) 0.2 % ophthalmic solution Place 1 drop into both eyes 2 (two) times daily. 03/19/19   [provider]  carvedilol (COREG) 25 MG tablet TAKE 2 TABLETS(50 MG) BY MOUTH TWICE DAILY 07/11/21   Chandrasekhar, Mahesh A, MD  dorzolamide-timolol (COSOPT) 22.3-6.8 MG/ML ophthalmic solution Place 1 drop into both eyes 2 (two) times daily. 03/10/19   [provider]  EQ FIBER SUPPLEMENT PO Take 2 tablets by mouth daily. colon cleanse Patient not taking: Reported on 07/12/2021    [provider]  Hexylresorcinol 2 MG LOZG 1 lozenge every 2 hours as needed for pain Patient not taking: Reported on 06/09/2021 06/04/21   Gildardo Pounds, NP  hydrALAZINE (APRESOLINE) 100 MG tablet Take 1 tablet (100 mg total) by mouth 3 (three) times daily. 10/20/20   Nche, Charlene Brooke, NP  isosorbide mononitrate (IMDUR) 30 MG 24 hr tablet Take 1 tablet (30 mg total) by mouth daily. 11/19/20 06/09/21  Chandrasekhar, Lyda Kalata A, MD  latanoprost (XALATAN) 0.005 % ophthalmic solution Place 1 drop into the left eye at bedtime. 04/14/19   [provider]  linaclotide Rolan Lipa) 145 MCG CAPS capsule Take 1 capsule (145 mcg total) by mouth daily before breakfast. **Needs appointment before next refill given** 09/29/21   Nche, Charlene Brooke, NP  omeprazole (PRILOSEC) 40 MG capsule Take 1 capsule (40 mg total) by mouth daily. Patient not taking: Reported on 06/09/2021 02/09/20 02/08/21  Mansouraty, Telford Nab., MD  ondansetron (ZOFRAN) 4 MG tablet Take  1 tablet (4 mg total) by mouth every 8 (eight) hours as needed for nausea or vomiting. Patient not taking: Reported on 06/09/2021 05/09/21   Mar Daring, PA-C  Potassium Chloride ER 20 MEQ TBCR Take 1 tablet by mouth daily. 11/15/20   [provider]  torsemide (DEMADEX) 20 MG tablet Take 2 tablets (40 mg total) by mouth 2 (two) times daily. Patient taking differently: Take 120 mg by mouth 2 (two) times daily. 4 tabs ('80mg'$ ) am, 2 tabs ('40mg'$ ) pm 02/12/20 06/09/21  Werner Lean, MD    Family History Family History  Problem Relation Age of Onset   Hypertension Mother    Hyperlipidemia Mother    Heart disease Mother    Kidney disease Mother    Depression Mother    Stomach cancer Father    Healthy Sister        x2   Healthy Brother        x2   Hypertension Maternal Grandmother    Stroke Maternal Grandmother    Heart disease  Maternal Grandmother    Hyperlipidemia Maternal Grandmother    Stroke Maternal Grandfather    Hypertension Maternal Grandfather    Heart disease Maternal Grandfather    Hyperlipidemia Maternal Grandfather    Alzheimer's disease Maternal Grandfather    Cancer - Other Paternal Grandmother    Healthy Daughter        x1   Healthy Son        x2   Diabetes Neg Hx    Heart attack Neg Hx    Sudden death Neg Hx    Colon cancer Neg Hx    Esophageal cancer Neg Hx    Pancreatic cancer Neg Hx    Inflammatory bowel disease Neg Hx    Liver disease Neg Hx    Rectal cancer Neg Hx     Social History Social History   Tobacco Use   Smoking status: Never   Smokeless tobacco: Never  Vaping Use   Vaping Use: Never used  Substance Use Topics   Alcohol use: Not Currently    Comment: occ   Drug use: No     Allergies   Patient has no known allergies.   Review of Systems Review of Systems  Constitutional:  Negative for chills and fever.  Eyes:  Negative for discharge and redness.  Respiratory:  Negative for shortness of breath.    Cardiovascular:  Negative for chest pain.  Gastrointestinal:  Negative for nausea and vomiting.  Neurological:  Positive for dizziness, numbness and headaches. Negative for speech difficulty.     Physical Exam Triage Vital Signs ED Triage Vitals [09/30/21 1309]  Enc Vitals Group     BP (!) 175/120     Pulse Rate 79     Resp 20     Temp      Temp Source Oral     SpO2 93 %     Weight      Height      Head Circumference      Peak Flow      Pain Score 0     Pain Loc      Pain Edu?      Excl. in [place]?    No data found.  Updated Vital Signs BP (!) 165/110 (BP Location: Left Arm)   Pulse 79   Resp 20   SpO2 93%   Physical Exam Vitals and nursing note reviewed.  Constitutional:      General: He is not in acute distress.    Appearance: He is obese. He is not ill-appearing.  HENT:     Head: Normocephalic and atraumatic.  Eyes:     Comments: Apparent proptosis to left   Cardiovascular:     Rate and Rhythm: Normal rate and regular rhythm.  Pulmonary:     Effort: Pulmonary effort is normal. No respiratory distress.     Breath sounds: Normal breath sounds. No wheezing, rhonchi or rales.  Neurological:     Mental Status: He is alert and oriented to person, place, and time.     Comments: Patient reports left arm tingling, no facial droop, no pronator drift      UC Treatments / Results  Labs (all labs ordered are listed, but only abnormal results are displayed) Labs Reviewed - No data to display  EKG   Radiology No results found.  Procedures Procedures (including critical care time)  Medications Ordered in UC Medications - No data to display  Initial Impression / Assessment and Plan / UC Course  I  have reviewed the triage vital signs and the nursing notes.  Pertinent labs & imaging results that were available during my care of the patient were reviewed by me and considered in my medical decision making (see chart for details).    EMS  notified for  transport- will need hospital work up to rule out stroke vs other cause of symptoms. EMS had patient ambulate to ambulance instead of using stretcher- unsure what reasoning was for this as they did bring in stretcher for transport. IV was started on patient as EMS arrived. EKG without significant noted change.   Final Clinical Impressions(s) / UC Diagnoses   Final diagnoses:  Hypertensive emergency  Numbness and tingling in left arm   Discharge Instructions   None    ED Prescriptions   None    PDMP not reviewed this encounter.   Francene Finders, PA-C 09/30/21 1336

## 2021-10-01 MED ORDER — ISOSORBIDE MONONITRATE ER 30 MG PO TB24
30.0000 mg | ORAL_TABLET | Freq: Every day | ORAL | 0 refills | Status: DC
  Filled 2021-10-01: qty 30, 30d supply, fill #0

## 2021-10-01 MED ORDER — ISOSORBIDE MONONITRATE ER 30 MG PO TB24
30.0000 mg | ORAL_TABLET | Freq: Every day | ORAL | 0 refills | Status: DC
  Filled 2021-10-01: qty 2, 2d supply, fill #0

## 2021-10-01 MED ORDER — HYDRALAZINE HCL 100 MG PO TABS
100.0000 mg | ORAL_TABLET | Freq: Three times a day (TID) | ORAL | 0 refills | Status: DC
  Filled 2021-10-01 – 2021-10-03 (×2): qty 90, 30d supply, fill #0
  Filled 2021-10-03: qty 79, 27d supply, fill #0
  Filled 2021-10-03: qty 90, 30d supply, fill #0
  Filled 2021-10-03: qty 11, 3d supply, fill #0

## 2021-10-01 MED ORDER — CARVEDILOL 25 MG PO TABS: 25.0000 mg | ORAL_TABLET | Freq: Two times a day (BID) | ORAL | 0 refills | Status: DC

## 2021-10-01 MED ORDER — HYDRALAZINE HCL 100 MG PO TABS: 100.0000 mg | ORAL_TABLET | Freq: Three times a day (TID) | ORAL | 0 refills | Status: DC

## 2021-10-01 MED ORDER — ISOSORBIDE MONONITRATE ER 30 MG PO TB24: 30.0000 mg | ORAL_TABLET | Freq: Every day | ORAL | 0 refills | Status: DC

## 2021-10-01 MED ORDER — HYDRALAZINE HCL 100 MG PO TABS
100.0000 mg | ORAL_TABLET | Freq: Three times a day (TID) | ORAL | 0 refills | Status: DC
  Filled 2021-10-01: qty 6, 2d supply, fill #0

## 2021-10-01 MED ORDER — CARVEDILOL 25 MG PO TABS
25.0000 mg | ORAL_TABLET | Freq: Two times a day (BID) | ORAL | 0 refills | Status: DC
  Filled 2021-10-01: qty 4, 2d supply, fill #0

## 2021-10-01 MED ORDER — CARVEDILOL 25 MG PO TABS
25.0000 mg | ORAL_TABLET | Freq: Two times a day (BID) | ORAL | 0 refills | Status: DC
  Filled 2021-10-01: qty 60, 30d supply, fill #0

## 2021-10-01 NOTE — Discharge Instructions (Addendum)
Please follow-up with your PCP. Talk to the pharmacy at [facility] for medication assistance/help. I have sent 2 days worth of medications via prescriptions with you to take to a pharmacy for a short fill before you can get to [place]. If you start slurring your words, develop chest pain, one sided weakness, or severe shortness of breath please return to the ER.

## 2021-10-03 ENCOUNTER — Other Ambulatory Visit: Payer: Self-pay

## 2021-10-03 ENCOUNTER — Telehealth: Payer: Self-pay

## 2021-10-03 NOTE — Telephone Encounter (Signed)
Transition Care Management Unsuccessful Follow-up Telephone Call  Date of discharge and from where:  10/01/21 [facility]  Attempts:  1st Attempt  Reason for unsuccessful TCM follow-up call:  Left voice message   Left a brief vmm to call the office.

## 2021-10-04 NOTE — Telephone Encounter (Signed)
Transition Care Management Follow-up Telephone Call Date of discharge and from where: 10/01/21 How have you been since you were released from the hospital? Something isn't right. Any questions or concerns? No  Items Reviewed: Did the pt receive and understand the discharge instructions provided? Yes  Medications obtained and verified? Yes  Other? No  Any new allergies since your discharge? No  Dietary orders reviewed? Yes Do you have support at home? Yes   Home Care and Equipment/Supplies: Were home health services ordered? not applicable If so, what is the name of the agency? N/a  Has the agency set up a time to come to the patient's home? not applicable Were any new equipment or medical supplies ordered?  No What is the name of the medical supply agency? N/a Were you able to get the supplies/equipment? not applicable Do you have any questions related to the use of the equipment or supplies? No  Functional Questionnaire: (I = Independent and D = Dependent) ADLs: I  Bathing/Dressing- I  Meal Prep- I  Eating- I  Maintaining continence- I  Transferring/Ambulation- I  Managing Meds- I  Follow up appointments reviewed:  PCP Hospital f/u appt confirmed? Yes  Scheduled to see [facility] patient) on 10/17/21 @ 10:20am. [facility] f/u appt confirmed?  N/a  Scheduled to see n/a on n/a @ n/a. Are transportation arrangements needed? No  If their condition worsens, is the pt aware to call PCP or go to the Emergency Dept.? Yes Was the patient provided with contact information for the PCP's office or ED? Yes Was to pt encouraged to call back with questions or concerns? Yes   Angeline Slim, RN, BSN

## 2021-10-10 ENCOUNTER — Other Ambulatory Visit: Payer: Self-pay

## 2021-10-17 ENCOUNTER — Telehealth: Payer: Self-pay

## 2021-10-17 ENCOUNTER — Encounter: Payer: Self-pay | Admitting: Family Medicine

## 2021-10-17 ENCOUNTER — Ambulatory Visit (INDEPENDENT_AMBULATORY_CARE_PROVIDER_SITE_OTHER): Payer: Self-pay | Admitting: Family Medicine

## 2021-10-17 VITALS — BP 156/98 | HR 64 | Temp 97.3°F | Ht 68.0 in | Wt 374.6 lb

## 2021-10-17 DIAGNOSIS — I5022 Chronic systolic (congestive) heart failure: Secondary | ICD-10-CM

## 2021-10-17 DIAGNOSIS — N185 Chronic kidney disease, stage 5: Secondary | ICD-10-CM

## 2021-10-17 DIAGNOSIS — I120 Hypertensive chronic kidney disease with stage 5 chronic kidney disease or end stage renal disease: Secondary | ICD-10-CM

## 2021-10-17 DIAGNOSIS — R7303 Prediabetes: Secondary | ICD-10-CM

## 2021-10-17 DIAGNOSIS — Z23 Encounter for immunization: Secondary | ICD-10-CM

## 2021-10-17 DIAGNOSIS — I10 Essential (primary) hypertension: Secondary | ICD-10-CM

## 2021-10-17 LAB — BASIC METABOLIC PANEL
BUN: 74 mg/dL — ABNORMAL HIGH (ref 6–23)
CO2: 27 mEq/L (ref 19–32)
Calcium: 8.6 mg/dL (ref 8.4–10.5)
Chloride: 101 mEq/L (ref 96–112)
Creatinine, Ser: 6.43 mg/dL (ref 0.40–1.50)
GFR: 9.86 mL/min — CL (ref >60.00)
Glucose, Bld: 77 mg/dL (ref 70–99)
Potassium: 3.4 mEq/L — ABNORMAL LOW (ref 3.5–5.1)
Sodium: 139 mEq/L (ref 135–145)

## 2021-10-17 MED ORDER — HYDRALAZINE HCL 100 MG PO TABS: 100.0000 mg | ORAL_TABLET | Freq: Three times a day (TID) | ORAL | 2 refills | Status: DC

## 2021-10-17 MED ORDER — TORSEMIDE 20 MG PO TABS: 80.0000 mg | ORAL_TABLET | ORAL | 2 refills | Status: DC

## 2021-10-17 MED ORDER — POTASSIUM CHLORIDE ER 20 MEQ PO TBCR: 20.0000 meq | EXTENDED_RELEASE_TABLET | Freq: Every day | ORAL | 2 refills | Status: DC

## 2021-10-17 MED ORDER — CARVEDILOL 25 MG PO TABS: 25.0000 mg | ORAL_TABLET | Freq: Two times a day (BID) | ORAL | 2 refills | Status: DC

## 2021-10-17 NOTE — Progress Notes (Addendum)
Established Patient Office Visit  Subjective   Patient ID: Frank Coffey, male    DOB: 11-18-1980  Age: 41 y.o. MRN: [id number]  Chief Complaint  Patient presents with   Hospitalization [facility] follow HTN states he may be diabetic. Pt is fasting.    HPI for refills of blood pressure medicines.  He has changed jobs and temporarily lost his health insurance and ran out of medicines.  Significant past medical history of hypertensive kidney disease stage V, history of CHF, and a recent hemoglobin A1c measured at 5.9.  He has seen cardiology and nephrology in the past for management.  Request refills for furosemide carvedilol hydralazine and potassium.  BMP from 2 weeks ago showed potassium of 3.1.  Tells me that he has been taking his potassium recently.  GFR was measured at 10.  Continues to produce urine.    Review of Systems  Constitutional: Negative.   HENT: Negative.    Eyes:  Negative for blurred vision, discharge and redness.  Respiratory: Negative.    Cardiovascular: Negative.   Gastrointestinal:  Negative for abdominal pain.  Genitourinary: Negative.   Musculoskeletal: Negative.  Negative for myalgias.  Skin:  Negative for rash.  Neurological:  Negative for tingling, loss of consciousness and weakness.  Endo/Heme/Allergies:  Negative for polydipsia.      Objective:     BP (!) 156/98 (BP Location: Left Arm)   Pulse 64   Temp (!) 97.3 F (36.3 C) (Temporal)   Ht '5\' 8"'$  (1.727 m)   Wt (!) 374 lb 9.6 oz (169.9 kg)   SpO2 92%   BMI 56.96 kg/m    Physical Exam Constitutional:      General: He is not in acute distress.    Appearance: Normal appearance. He is not ill-appearing, toxic-appearing or diaphoretic.  HENT:     Head: Normocephalic and atraumatic.     Right Ear: External ear normal.     Left Ear: External ear normal.     Mouth/Throat:     Mouth: Mucous membranes are moist.     Pharynx: Oropharynx is clear. No oropharyngeal exudate or posterior  oropharyngeal erythema.  Eyes:     General: No scleral icterus.       Right eye: No discharge.        Left eye: No discharge.     Extraocular Movements: Extraocular movements intact.     Conjunctiva/sclera: Conjunctivae normal.     Pupils: Pupils are equal, round, and reactive to light.  Cardiovascular:     Rate and Rhythm: Normal rate and regular rhythm.  Pulmonary:     Effort: Pulmonary effort is normal. No respiratory distress.     Breath sounds: Normal breath sounds. No wheezing or rhonchi.  Abdominal:     General: Bowel sounds are normal.     Tenderness: There is no abdominal tenderness. There is no guarding.  Musculoskeletal:     Cervical back: No rigidity or tenderness.  Skin:    General: Skin is warm and dry.  Neurological:     Mental Status: He is alert and oriented to person, place, and time.  Psychiatric:        Mood and Affect: Mood normal.        Behavior: Behavior normal.      Results for orders placed or performed in visit on 37/10/62  Basic metabolic panel  Result Value Ref Range   Sodium 139 135 - 145 mEq/L   Potassium 3.4 (L)  3.5 - 5.1 mEq/L   Chloride 101 96 - 112 mEq/L   CO2 27 19 - 32 mEq/L   Glucose, Bld 77 70 - 99 mg/dL   BUN 74 (H) 6 - 23 mg/dL   Creatinine, Ser 6.43 (HH) 0.40 - 1.50 mg/dL   GFR 9.86 (LL) >60.00 mL/min   Calcium 8.6 8.4 - 10.5 mg/dL      The 10-year ASCVD risk score (Arnett DK, et al., 2019) is: 9.1%    Assessment & Plan:   Problem List Items Addressed This Visit       Cardiovascular and Mediastinum   Chronic systolic CHF (congestive heart failure) (HCC)   Relevant Medications   torsemide (DEMADEX) 20 MG tablet   hydrALAZINE (APRESOLINE) 100 MG tablet   carvedilol (COREG) 25 MG tablet   Other Relevant Orders   Ambulatory referral to Cardiology   Other Visit Diagnoses     Essential hypertension    -  Primary   Relevant Medications   torsemide (DEMADEX) 20 MG tablet   Potassium Chloride ER 20 MEQ TBCR    hydrALAZINE (APRESOLINE) 100 MG tablet   carvedilol (COREG) 25 MG tablet   Other Relevant Orders   Ambulatory referral to Cardiology   Ambulatory referral to Nephrology   Basic metabolic panel (Completed)   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  Must see cardiology/nephrology for further refills for blood pressure meds..  Patient is prediabetic.  Could consider Sitagliptin.  Patient has a history of CHF.  Nephrology should manage.  Flu vaccine today.  Libby Maw, MD

## 2021-10-17 NOTE — Telephone Encounter (Signed)
[facility]  Caller: Hope Scales, lab  Receiver: Carine Nordgren L. CMA/CPT   Time: 4:05  Critical Value:  Creatine: 6.43  GFR: 9.86

## 2021-10-19 ENCOUNTER — Encounter: Payer: Self-pay | Admitting: Internal Medicine

## 2021-10-19 ENCOUNTER — Ambulatory Visit: Payer: Self-pay | Attending: Internal Medicine | Admitting: Internal Medicine

## 2021-10-19 VITALS — BP 140/68 | HR 67 | Ht 68.5 in | Wt 377.0 lb

## 2021-10-19 DIAGNOSIS — E785 Hyperlipidemia, unspecified: Secondary | ICD-10-CM

## 2021-10-19 DIAGNOSIS — I428 Other cardiomyopathies: Secondary | ICD-10-CM

## 2021-10-19 DIAGNOSIS — G4733 Obstructive sleep apnea (adult) (pediatric): Secondary | ICD-10-CM

## 2021-10-19 DIAGNOSIS — I5022 Chronic systolic (congestive) heart failure: Secondary | ICD-10-CM

## 2021-10-19 MED ORDER — ISOSORBIDE MONONITRATE ER 60 MG PO TB24: 60.0000 mg | ORAL_TABLET | Freq: Every day | ORAL | 3 refills | Status: DC

## 2021-10-19 NOTE — Patient Instructions (Signed)
Medication Instructions:  Your physician has recommended you make the following change in your medication:  INCREASE: isosorbide mononitrate (IMDUR) to 60 mg by mouth once daily  *If you need a refill on your cardiac medications before your next appointment, please call your pharmacy*   Lab Work: NONE If you have labs (blood work) drawn today and your tests are completely normal, you will receive your results only by: [place] (if you have MyChart) OR A paper copy in the mail If you have any lab test that is abnormal or we need to change your treatment, we will call you to review the results.   Testing/Procedures: Your physician has requested that you have an echocardiogram. Echocardiography is a painless test that uses sound waves to create images of your heart. It provides your doctor with information about the size and shape of your heart and how well your heart's chambers and valves are working. This procedure takes approximately one hour. There are no restrictions for this procedure.    Follow-Up: At [facility], you and your health needs are our priority.  As part of our continuing mission to provide you with exceptional heart care, we have created designated Provider Care Teams.  These Care Teams include your primary Cardiologist (physician) and Advanced Practice Providers (APPs -  Physician Assistants and Nurse Practitioners) who all work together to provide you with the care you need, when you need it.   Your next appointment:   3-4 month(s)  The format for your next appointment:   In Person  Provider:   Werner Lean, MD     Important Information About [place]

## 2021-10-19 NOTE — Progress Notes (Signed)
Cardiology Office Note:    Date:  10/19/2021   ID:  Frank Coffey, DOB 1980/03/15, MRN [id number]  PCP:  Flossie Buffy, NP  [facility] Cardiologist:  Werner Lean, MD  [facility] Electrophysiologist:  None   CC: HF F/U  History of Present Illness:    Frank Coffey is a 41 y.o. 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  In interim of this visit, patient had borderline dilation of his thoracic aorta with stable EF.  Has started classes for HD.  Seen 11/19/20. Lost to follow up in 2022. Switched jobs and just got [employer]  Patient notes that he is doing better.   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 Has issues with the high dose coreg but its feeling better.   Is having some dizziness.  He uses BIPAP for his OSA.  No chest pain or pressure.  No SOB/DOE and no PND/Orthopnea.  No weight gain or leg swelling.  No palpitations or syncope.   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)    IBS (irritable bowel syndrome)    Obesity    OSA (obstructive sleep apnea)    Pneumonia 04/2019   with Covid   Sleep apnea     Past Surgical History:  Procedure Laterality Date   BIOPSY  02/09/2020   Procedure: BIOPSY;  Surgeon: Irving Copas., MD;  Location: [facility];  Service: Gastroenterology;;   CHOLECYSTECTOMY      COLONOSCOPY WITH PROPOFOL N/A 02/09/2020   Procedure: COLONOSCOPY WITH PROPOFOL;  Surgeon: Irving Copas., MD;  Location: [place];  Service: Gastroenterology;  Laterality: N/A;   ESOPHAGOGASTRODUODENOSCOPY (EGD) WITH PROPOFOL N/A 02/09/2020   Procedure: ESOPHAGOGASTRODUODENOSCOPY (EGD) WITH PROPOFOL;  Surgeon: Rush Landmark Telford Nab., MD;  Location: [place];  Service: Gastroenterology;  Laterality: N/A;   POLYPECTOMY  02/09/2020   Procedure: POLYPECTOMY;  Surgeon: Irving Copas., MD;  Location: [place];  Service: Gastroenterology;;   RETINAL DETACHMENT REPAIR W/ SCLERAL BUCKLE LE     Left    Current Medications: Current Meds  Medication Sig   albuterol (VENTOLIN HFA) 108 (90 Base) MCG/ACT inhaler Inhale 1-2 puffs into the lungs every 6 (six) hours as needed for wheezing (cough).   allopurinol (ZYLOPRIM) 300 MG tablet TAKE 1 TABLET(300 MG) BY MOUTH DAILY   atorvastatin (LIPITOR) 20 MG tablet Take 1 tablet (20 mg total) by mouth daily.   benzonatate (TESSALON) 100 MG capsule Take 1 capsule (100 mg total) by mouth 3 (three) times daily as needed for cough.   brimonidine (ALPHAGAN) 0.2 % ophthalmic solution Place 1 drop into both eyes in the morning and at bedtime.   carvedilol (COREG) 25 MG tablet TAKE 2 TABLETS(50 MG) BY  MOUTH TWICE DAILY   dorzolamide-timolol (COSOPT) 22.3-6.8 MG/ML ophthalmic solution Place 1 drop into both eyes in the morning and at bedtime.   EQ FIBER SUPPLEMENT PO Take 2 tablets by mouth daily.   hydrALAZINE (APRESOLINE) 100 MG tablet Take 1 tablet (100 mg total) by mouth 3 (three) times daily.   isosorbide mononitrate (IMDUR) 60 MG 24 hr tablet Take 1 tablet (60 mg total) by mouth daily.   latanoprost (XALATAN) 0.005 % ophthalmic solution Place 1 drop into the left eye at bedtime.   linaclotide (LINZESS) 145 MCG CAPS capsule Take 1 capsule (145 mcg total) by mouth daily before breakfast. **Needs appointment before next refill given**    olmesartan (BENICAR) 40 MG tablet Take 1 tablet by mouth daily.   omeprazole (PRILOSEC) 40 MG capsule Take 1 capsule (40 mg total) by mouth daily.   ondansetron (ZOFRAN) 4 MG tablet Take 1 tablet (4 mg total) by mouth every 8 (eight) hours as needed for nausea or vomiting.   Potassium Chloride ER 20 MEQ TBCR Take 20 mEq by mouth daily.   prednisoLONE acetate (PRED FORTE) 1 % ophthalmic suspension 3 (three) times daily.   spironolactone (ALDACTONE) 25 MG tablet Take 1 tablet by mouth daily.   torsemide (DEMADEX) 20 MG tablet Take 4 tablets (80 mg total) by mouth See admin instructions. Take 80 mg by mouth twice a day- morning and evening   Vitamin D, Ergocalciferol, (DRISDOL) 1.25 MG (50000 UNIT) CAPS capsule Take 50,000 Units by mouth every Wednesday.   [DISCONTINUED] carvedilol (COREG) 25 MG tablet Take 1 tablet (25 mg total) by mouth 2 (two) times daily with a meal.   [DISCONTINUED] isosorbide mononitrate (IMDUR) 30 MG 24 hr tablet Take 1 tablet (30 mg total) by mouth daily.     Allergies:   Patient has no known allergies.   Social History   Socioeconomic History   Marital status: Divorced    Spouse name: Not on file   Number of children: Not on file   Years of education: Not on file   Highest education level: Not on file  Occupational History   Not on file  Tobacco Use   Smoking status: Never   Smokeless tobacco: Never  Vaping Use   Vaping Use: Never used  Substance and Sexual Activity   Alcohol use: Not Currently    Comment: occ   Drug use: No   Sexual activity: Not on file  Other Topics Concern   Not on file  Social History Narrative   Epworth Sleepiness Scale = 10 (as of 12/01/14)   Social Determinants of Health   Financial Resource Strain: Not on file  Food Insecurity: Not on file  Transportation Needs: Not on file  Physical Activity: Not on file  Stress: Not on file  Social Connections: Not on file    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, Freight forwarder at  [employer]  Family History: The patient's family history includes Alzheimer's disease in his maternal grandfather; Cancer - Other in his paternal grandmother; Depression in his mother; Healthy in his brother, daughter, sister, and son; Heart disease in his maternal grandfather, maternal grandmother, and mother; Hyperlipidemia in his maternal grandfather, maternal grandmother, and mother; Hypertension in his maternal grandfather, maternal grandmother, and mother; Kidney disease in his mother; Stomach cancer in his father; Stroke in his maternal grandfather and maternal grandmother. There is no history of Diabetes, Heart attack, Sudden death, Colon cancer, Esophageal cancer, Pancreatic cancer, Inflammatory bowel disease, Liver disease, or Rectal cancer. Notes that  his mother died of NCIM NOS and does not want to end up like her.  Notes that he was her POA and has records confirmed his previous records request.  ROS:   Please see the history of present illness.     All other systems reviewed and are negative.  EKGs/Labs/Other Studies Reviewed:    The following studies were reviewed today:  EKG:   02/12/2020: SR 1st HB LVH with 2nd repolarization 05/01/19: SR rate 62 with LVH  Cardiac Event Monitoring: Date: 11/28/2017 Results: Quality: Fair.  Baseline artifact. Predominant rhythm: sinus rhythm Average heart rate: 81 bpm Max heart rate: 114 bpm Min heart rate: 49 bpm  One episode of second degree heart block, Mobitz type I No other arrhythmias noted.     Transthoracic Echocardiogram: Date: 11/14/2017 Results:  1. Left ventricular ejection fraction, by estimation, is 35%. The left  ventricle has moderately decreased function. The left ventricle  demonstrates global hypokinesis. The left ventricular internal cavity size  was moderately dilated. There is mild left  ventricular hypertrophy. Left ventricular diastolic parameters are  consistent with Grade II diastolic dysfunction  (pseudonormalization).   2. Right ventricular systolic function is normal. The right ventricular  size is normal. Tricuspid regurgitation signal is inadequate for assessing  PA pressure.   3. Left atrial size was moderately dilated.   4. Right atrial size was mildly dilated.   5. The mitral valve is normal in structure. No evidence of mitral valve  regurgitation. No evidence of mitral stenosis.   6. The aortic valve is tricuspid. Aortic valve regurgitation is not  visualized. No aortic stenosis is present.   7. Aortic dilatation noted. There is mild dilatation of the aortic root,  measuring 37 mm.   8. The inferior vena cava is normal in size with greater than 50%  respiratory variability, suggesting right atrial pressure of 3 mmHg   Recent Labs: 07/12/2021: ALT 25; TSH 3.510 09/30/2021: Hemoglobin 12.5; Platelets 272 10/17/2021: BUN 74; Creatinine, Ser 6.43; Potassium 3.4; Sodium 139  Recent Lipid Panel    Component Value Date/Time   CHOL 181 07/12/2021 1207   TRIG 92 07/12/2021 1207   HDL 41 07/12/2021 1207   CHOLHDL 6 05/29/2018 0923   VLDL 20.8 05/29/2018 0923   LDLCALC 123 (H) 07/12/2021 1207    Physical Exam:    VS:  BP (!) 140/68   Pulse 67   Ht 5' 8.5" (1.74 m)   Wt (!) 377 lb (171 kg)   SpO2 95%   BMI 56.49 kg/m     Wt Readings from Last 3 Encounters:  10/19/21 (!) 377 lb (171 kg)  10/17/21 (!) 374 lb 9.6 oz (169.9 kg)  09/30/21 (!) 370 lb (167.8 kg)   Gen: No distress morbid obesity   Neck: No JVD Cardiac: No Rubs or Gallops, No murmur, RRR +2 radial pulses Respiratory: Clear to auscultation bilaterally, normal effort, normal  respiratory rate GI: Soft, nontender, non-distended  MS: bilateral +1 edema;  moves all extremities Integument: Skin feels warm Neuro:  At time of evaluation, alert and oriented to person/place/time/situation  Psych: Normal affect, patient feels   ASSESSMENT:    1. Chronic systolic CHF (congestive heart failure) ([place])   2.  Morbid obesity ([place])   3. NICM (nonischemic cardiomyopathy) ([place])   4. Hyperlipidemia, unspecified hyperlipidemia type   5. OSA (obstructive sleep apnea)     PLAN:    Heart Failure Reduced Ejection Fraction Morbid Obesity HTN CKD Stage IV OSA on BIPAP -  NYHA class I, Stage C, euvolemic, etiology HTN mediated - Diuretic regimen: Torsemide 80 mg PO BID - Coreg 50 mg PO BID (he is > 85 kg) - ARNI/ARB/ACEi/aldactone/SGLT2i stopped in the setting of CKD IV - Continue hydralazine 100 mg TID  - increase IMDUR to 60 mg - has 11/04/21 nephrology visit; he is safe to get AV fistula if this is planned for HD - repeat echo  Hyperlipidemia -LDL goal less than 100 -continue current statin, when he is back on his therapy, we will escalate dose given his prior TIA history  3-4 months me or APP   Medication Adjustments/Labs and Tests Ordered: Current medicines are reviewed at length with the patient today.  Concerns regarding medicines are outlined above.  Orders Placed This Encounter  Procedures   ECHOCARDIOGRAM COMPLETE    Meds ordered this encounter  Medications   isosorbide mononitrate (IMDUR) 60 MG 24 hr tablet    Sig: Take 1 tablet (60 mg total) by mouth daily.    Dispense:  90 tablet    Refill:  3     Patient Instructions  Medication Instructions:  Your physician has recommended you make the following change in your medication:  INCREASE: isosorbide mononitrate (IMDUR) to 60 mg by mouth once daily  *If you need a refill on your cardiac medications before your next appointment, please call your pharmacy*   Lab Work: NONE If you have labs (blood work) drawn today and your tests are completely normal, you will receive your results only by: [place] (if you have MyChart) OR A paper copy in the mail If you have any lab test that is abnormal or we need to change your treatment, we will call you to review the results.   Testing/Procedures: Your physician has  requested that you have an echocardiogram. Echocardiography is a painless test that uses sound waves to create images of your heart. It provides your doctor with information about the size and shape of your heart and how well your heart's chambers and valves are working. This procedure takes approximately one hour. There are no restrictions for this procedure.    Follow-Up: At [facility], you and your health needs are our priority.  As part of our continuing mission to provide you with exceptional heart care, we have created designated Provider Care Teams.  These Care Teams include your primary Cardiologist (physician) and Advanced Practice Providers (APPs -  Physician Assistants and Nurse Practitioners) who all work together to provide you with the care you need, when you need it.   Your next appointment:   3-4 month(s)  The format for your next appointment:   In Person  Provider:   Werner Lean, MD     Important Information About Sugar         Signed, Werner Lean, MD  10/19/2021 3:37 PM    [place]

## 2021-10-27 ENCOUNTER — Other Ambulatory Visit: Payer: Self-pay | Admitting: Nurse Practitioner

## 2021-10-27 DIAGNOSIS — K5904 Chronic idiopathic constipation: Secondary | ICD-10-CM

## 2021-10-29 ENCOUNTER — Other Ambulatory Visit: Payer: Self-pay | Admitting: Nurse Practitioner

## 2021-10-29 DIAGNOSIS — K5904 Chronic idiopathic constipation: Secondary | ICD-10-CM

## 2021-10-31 ENCOUNTER — Other Ambulatory Visit: Payer: Self-pay

## 2021-10-31 MED ORDER — SPIRONOLACTONE 25 MG PO TABS
25.0000 mg | ORAL_TABLET | Freq: Every day | ORAL | 2 refills | Status: DC
  Filled 2021-10-31: qty 30, 30d supply, fill #0

## 2021-10-31 MED ORDER — OLMESARTAN MEDOXOMIL 40 MG PO TABS
40.0000 mg | ORAL_TABLET | Freq: Every day | ORAL | 2 refills | Status: DC
  Filled 2021-10-31: qty 30, 30d supply, fill #0

## 2021-10-31 NOTE — Telephone Encounter (Signed)
Chart supports Rx Last OV: 10/2021 Next OV: not scheduled   

## 2021-10-31 NOTE — Telephone Encounter (Signed)
Pt had appt w/ Dr. Ethelene Hal on 10/18/2021.

## 2021-11-01 ENCOUNTER — Other Ambulatory Visit: Payer: Self-pay

## 2021-11-05 LAB — CBC AND DIFFERENTIAL: Hemoglobin: 11.6 — AB (ref 13.5–17.5)

## 2021-11-05 LAB — BASIC METABOLIC PANEL
Chloride: 99 (ref 99–108)
Creatinine: 6.3 — AB (ref <=1.3)
Glucose: 85
Potassium: 3.7 mEq/L (ref 3.5–5.1)
Sodium: 140 (ref 137–147)

## 2021-11-05 LAB — COMPREHENSIVE METABOLIC PANEL
Calcium: 8.8 (ref 8.7–10.7)
eGFR: 11

## 2021-11-05 LAB — VITAMIN D 25 HYDROXY (VIT D DEFICIENCY, FRACTURES): Vit D, 25-Hydroxy: 26.9

## 2021-11-07 ENCOUNTER — Other Ambulatory Visit: Payer: Self-pay

## 2021-11-08 ENCOUNTER — Encounter: Payer: Self-pay | Admitting: Internal Medicine

## 2021-11-08 ENCOUNTER — Ambulatory Visit (HOSPITAL_COMMUNITY): Payer: 59 | Attending: Internal Medicine

## 2021-11-08 DIAGNOSIS — I5022 Chronic systolic (congestive) heart failure: Secondary | ICD-10-CM | POA: Insufficient documentation

## 2021-11-08 LAB — ECHOCARDIOGRAM COMPLETE
Area-P 1/2: 3.21 cm2
S' Lateral: 5 cm

## 2021-11-08 MED ORDER — PERFLUTREN LIPID MICROSPHERE
1.0000 mL | INTRAVENOUS | Status: AC | PRN
  Administered 2021-11-08: 1 mL via INTRAVENOUS

## 2021-11-09 ENCOUNTER — Telehealth: Payer: Self-pay

## 2021-11-09 DIAGNOSIS — I428 Other cardiomyopathies: Secondary | ICD-10-CM

## 2021-11-09 DIAGNOSIS — R072 Precordial pain: Secondary | ICD-10-CM

## 2021-11-09 MED ORDER — ISOSORBIDE MONONITRATE ER 60 MG PO TB24: 90.0000 mg | ORAL_TABLET | Freq: Every day | ORAL | 3 refills | Status: DC

## 2021-11-09 NOTE — Telephone Encounter (Signed)
-----   Message from Werner Lean, MD sent at 11/09/2021  9:22 AM EDT ----- EF similar Aorta mildly dilated BP still up - Imdur to 90 mg - echo in one year

## 2021-11-09 NOTE — Telephone Encounter (Signed)
Sent increase imdur 90 mg to patient's pharmacy.

## 2021-11-10 NOTE — Telephone Encounter (Signed)
Frank Lean, MD  Michaelyn Barter, RN Let's set him up for PET MPI; and do the increase in his IMDUR.  He is young and was a NICM.  Once he starts HD we may be able to get him in for ischemic evaluation.

## 2021-11-10 NOTE — Addendum Note (Signed)
Addended by: Precious Gilding on: 11/10/2021 05:48 PM   Modules accepted: Orders

## 2021-11-10 NOTE — Telephone Encounter (Signed)
Called pt advised of MD recommendation for Cardiac PET.  Is currently not having CP.  Pt is agreeable to have testing completed.  RN verbally reviewed instructions for testing and placed on my chart for pt to review.  Pt had no questions or concerns.  Advised to call office or send my chart message with questions or concerns.

## 2021-11-14 ENCOUNTER — Telehealth: Payer: 59 | Admitting: Physician Assistant

## 2021-11-14 DIAGNOSIS — M10071 Idiopathic gout, right ankle and foot: Secondary | ICD-10-CM | POA: Diagnosis not present

## 2021-11-14 MED ORDER — PREDNISONE 20 MG PO TABS: 40.0000 mg | ORAL_TABLET | Freq: Every day | ORAL | 0 refills | Status: DC

## 2021-11-14 NOTE — Progress Notes (Signed)
E-Visit for Gout Symptoms  We are sorry that you are not feeling well. We are here to help!  Based on what you shared with me it looks like you have a flare of your gout.  Gout is a form of arthritis. It can cause pain and swelling in the joints. At first, it tends to affect only 1 joint - most frequently the big toe. It happens in people who have too much uric acid in the blood. Uric acid is a chemical that is produced when the body breaks down certain foods. Uric acid can form sharp needle-like crystals that build up in the joints and cause pain. Uric acid crystals can also form inside the tubes that carry urine from the kidneys to the bladder. These crystals can turn into "kidney stones" that can cause pain and problems with the flow of urine. People with gout get sudden "flares" or attacks of severe pain, most often the big toe, ankle, or knee. Often the joint also turns red and swells. Usually, only 1 joint is affected, but some people have pain in more than 1 joint. Gout flares tend to happen more often during the night.  The pain from gout can be extreme. The pain and swelling are worst at the beginning of a gout flare. The symptoms then get better within a few days to weeks. It is not clear how the body "turns off" a gout flare.  Do not start any NEW preventative medicine until the gout has cleared completely. However, If you are already on Probenecid or Allopurinol for CHRONIC gout, you may continue taking this during an active flare up  I have prescribed Prednisone 40 mg daily for 7   HOME CARE Losing weight can help relieve gout. It's not clear that following a specific diet plan will help with gout symptoms but eating a balanced diet can help improve your overall health. It can also help you lose weight, if you are overweight. In general, a healthy diet includes plenty of fruits, vegetables, whole grains, and low-fat dairy products (labelled "low fat", skim, 2%). Avoid sugar sweetened  drinks (including sodas, tea, juice and juice blends, coffee drinks and sports drinks) Limit alcohol to 1-2 drinks of beer, spirits or wine daily these can make gout flares worse. Some people with gout also have other health problems, such as heart disease, high blood pressure, kidney disease, or obesity. If you have any of these issues, it's important to work with your doctor to manage them. This can help improve your overall health and might also help with your gout.  GET HELP RIGHT AWAY IF: Your symptoms persist after you have completed your treatment plan You develop severe diarrhea You develop abnormal sensations  You develop vomiting,   You develop weakness  You develop abdominal pain  FOLLOW UP WITH YOUR PRIMARY PROVIDER IF: If your symptoms do not improve within 10 days  MAKE SURE YOU  Understand these instructions. Will watch your condition. Will get help right away if you are not doing well or get worse.  Thank you for choosing an e-visit.  Your e-visit answers were reviewed by a board certified advanced clinical practitioner to complete your personal care plan. Depending upon the condition, your plan could have included both over the counter or prescription medications.  Please review your pharmacy choice. Make sure the pharmacy is open so you can pick up prescription now. If there is a problem, you may contact your provider through [employer] and have  the prescription routed to another pharmacy.  Your safety is important to [place]. If you have drug allergies check your prescription carefully.   For the next 24 hours you can use MyChart to ask questions about today's visit, request a non-urgent call back, or ask for a work or school excuse. You will get an email in the next two days asking about your experience. I hope that your e-visit has been valuable and will speed your recovery.  I have spent 5 minutes in review of e-visit questionnaire, review and updating patient  chart, medical decision making and response to patient.   Mar Daring, PA-C

## 2021-11-14 NOTE — Progress Notes (Deleted)
[facility] [place]  ASSESSMENT / PLAN: Frank Coffey is a 41 y.o. *** handed male in need of permanent dialysis access. I reviewed options for dialysis in detail with the patient, including hemodialysis and peritoneal dialysis. I counseled the patient to ask their nephrologist about their candidacy for renal transplant. I counseled the patient that dialysis access requires surveillance and periodic maintenance. Plan to proceed with ***.    CHIEF COMPLAINT: ***  HISTORY OF PRESENT ILLNESS: Frank Coffey is a 41 y.o. male ***  VASCULAR SURGICAL HISTORY: ***  VASCULAR RISK FACTORS: {FINDINGS; POSITIVE NEGATIVE:2013777035} history of stroke / transient ischemic attack. {FINDINGS; POSITIVE NEGATIVE:2013777035} history of coronary artery disease. *** history of PCI. *** history of CABG.  {FINDINGS; POSITIVE NEGATIVE:2013777035} history of diabetes mellitus. Last A1c ***. {FINDINGS; POSITIVE NEGATIVE:2013777035} history of smoking. *** actively smoking. {FINDINGS; POSITIVE NEGATIVE:2013777035} history of hypertension. *** drug regimen with *** control. {FINDINGS; POSITIVE NEGATIVE:2013777035} history of chronic kidney disease.  Last GFR ***. CKD {stage:30421363}. {FINDINGS; POSITIVE NEGATIVE:2013777035} history of chronic obstructive pulmonary disease, treated with ***.  FUNCTIONAL STATUS: ECOG performance status: {findings; ecog performance status:31780} Ambulatory status: {TNHAmbulation:25868}  CAREY 1 AND 3 YEAR INDEX Male (2pts) 75-79 or 80-84 (2pts) >84 (3pts) Dependence in toileting (1pt) Partial or full dependence in dressing (1pt) History of malignant neoplasm (2pts) CHF (3pts) COPD (1pts) CKD (3pts)  0-3 pts 6% 1 year mortality ; 21% 3 year mortality 4-5 pts 12% 1 year mortality ; 36% 3 year mortality >5 pts 21% 1 year mortality; 54% 3 year mortality   Past Medical History:  Diagnosis Date   CHF (congestive heart failure) (HCC)    Chronic  constipation    Chronic kidney disease, stage 3, mod decreased GFR (HCC)    Followed by [facility]   Constipation    Depression    "when I had Covid"   Gout    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   History of kidney stones    Passed   Hx of migraine headaches    Hyperlipidemia    Hypertension    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)    IBS (irritable bowel syndrome)    Obesity    OSA (obstructive sleep apnea)    Pneumonia 04/2019   with Covid   Sleep apnea     Past Surgical History:  Procedure Laterality Date   BIOPSY  02/09/2020   Procedure: BIOPSY;  Surgeon: Irving Copas., MD;  Location: [facility] ENDOSCOPY;  Service: Gastroenterology;;   CHOLECYSTECTOMY     COLONOSCOPY WITH PROPOFOL N/A 02/09/2020   Procedure: COLONOSCOPY WITH PROPOFOL;  Surgeon: Irving Copas., MD;  Location: [facility] ENDOSCOPY;  Service: Gastroenterology;  Laterality: N/A;   ESOPHAGOGASTRODUODENOSCOPY (EGD) WITH PROPOFOL N/A 02/09/2020   Procedure: ESOPHAGOGASTRODUODENOSCOPY (EGD) WITH PROPOFOL;  Surgeon: Rush Landmark Telford Nab., MD;  Location: [place];  Service: Gastroenterology;  Laterality: N/A;   POLYPECTOMY  02/09/2020   Procedure: POLYPECTOMY;  Surgeon: Mansouraty, Telford Nab., MD;  Location: [facility] ENDOSCOPY;  Service: Gastroenterology;;   RETINAL DETACHMENT REPAIR W/ SCLERAL BUCKLE LE     Left    Family History  Problem Relation Age of Onset   Hypertension Mother    Hyperlipidemia Mother    Heart disease Mother    Kidney disease Mother    Depression Mother    Stomach cancer Father    Healthy Sister        x2   Healthy Brother  x2   Hypertension Maternal Grandmother    Stroke Maternal Grandmother    Heart disease Maternal Grandmother    Hyperlipidemia Maternal Grandmother    Stroke Maternal Grandfather    Hypertension Maternal Grandfather    Heart disease Maternal Grandfather    Hyperlipidemia Maternal Grandfather     Alzheimer's disease Maternal Grandfather    Cancer - Other Paternal Grandmother    Healthy Daughter        x1   Healthy Son        x2   Diabetes Neg Hx    Heart attack Neg Hx    Sudden death Neg Hx    Colon cancer Neg Hx    Esophageal cancer Neg Hx    Pancreatic cancer Neg Hx    Inflammatory bowel disease Neg Hx    Liver disease Neg Hx    Rectal cancer Neg Hx     Social History   Socioeconomic History   Marital status: Divorced    Spouse name: Not on file   Number of children: Not on file   Years of education: Not on file   Highest education level: Not on file  Occupational History   Not on file  Tobacco Use   Smoking status: Never   Smokeless tobacco: Never  Vaping Use   Vaping Use: Never used  Substance and Sexual Activity   Alcohol use: Not Currently    Comment: occ   Drug use: No   Sexual activity: Not on file  Other Topics Concern   Not on file  Social History Narrative   Epworth Sleepiness Scale = 10 (as of 12/01/14)   Social Determinants of Health   Financial Resource Strain: Not on file  Food Insecurity: Not on file  Transportation Needs: Not on file  Physical Activity: Not on file  Stress: Not on file  Social Connections: Not on file  Intimate Partner Violence: Not on file    No Known Allergies  Current Outpatient Medications  Medication Sig Dispense Refill   albuterol (VENTOLIN HFA) 108 (90 Base) MCG/ACT inhaler Inhale 1-2 puffs into the lungs every 6 (six) hours as needed for wheezing (cough). 8 g 2   allopurinol (ZYLOPRIM) 300 MG tablet TAKE 1 TABLET(300 MG) BY MOUTH DAILY 90 tablet 0   atorvastatin (LIPITOR) 20 MG tablet Take 1 tablet (20 mg total) by mouth daily. 90 tablet 3   benzonatate (TESSALON) 100 MG capsule Take 1 capsule (100 mg total) by mouth 3 (three) times daily as needed for cough. 30 capsule 0   brimonidine (ALPHAGAN) 0.2 % ophthalmic solution Place 1 drop into both eyes in the morning and at bedtime.     carvedilol (COREG) 25  MG tablet TAKE 2 TABLETS(50 MG) BY MOUTH TWICE DAILY 360 tablet 1   dorzolamide-timolol (COSOPT) 22.3-6.8 MG/ML ophthalmic solution Place 1 drop into both eyes in the morning and at bedtime.     EQ FIBER SUPPLEMENT PO Take 2 tablets by mouth daily.     hydrALAZINE (APRESOLINE) 100 MG tablet Take 1 tablet (100 mg total) by mouth 3 (three) times daily. 270 tablet 3   isosorbide mononitrate (IMDUR) 60 MG 24 hr tablet Take 1.5 tablets (90 mg total) by mouth daily. 135 tablet 3   latanoprost (XALATAN) 0.005 % ophthalmic solution Place 1 drop into the left eye at bedtime.     LINZESS 145 MCG CAPS capsule TAKE 1 CAPSULE(145 MCG) BY MOUTH DAILY BEFORE BREAKFAST. GIVEN** 90 capsule 3   olmesartan (BENICAR)  40 MG tablet Take 1 tablet (40 mg total) by mouth daily. 30 tablet 2   omeprazole (PRILOSEC) 40 MG capsule Take 1 capsule (40 mg total) by mouth daily. 30 capsule 6   ondansetron (ZOFRAN) 4 MG tablet Take 1 tablet (4 mg total) by mouth every 8 (eight) hours as needed for nausea or vomiting. 20 tablet 0   Potassium Chloride ER 20 MEQ TBCR Take 20 mEq by mouth daily. 60 tablet 2   prednisoLONE acetate (PRED FORTE) 1 % ophthalmic suspension 3 (three) times daily.     predniSONE (DELTASONE) 20 MG tablet Take 2 tablets (40 mg total) by mouth daily with breakfast. 14 tablet 0   spironolactone (ALDACTONE) 25 MG tablet Take 1 tablet (25 mg total) by mouth daily. 30 tablet 2   torsemide (DEMADEX) 20 MG tablet Take 4 tablets (80 mg total) by mouth See admin instructions. Take 80 mg by mouth twice a day- morning and evening 60 tablet 2   Vitamin D, Ergocalciferol, (DRISDOL) 1.25 MG (50000 UNIT) CAPS capsule Take 50,000 Units by mouth every Wednesday.     No current facility-administered medications for this visit.    PHYSICAL EXAM There were no vitals filed for this visit.  Constitutional: *** appearing. *** distress. Appears *** nourished.  Neurologic: CN ***. *** focal findings. *** sensory  loss. Psychiatric: *** Mood and affect symmetric and appropriate. Eyes: *** No icterus. No conjunctival pallor. Ears, nose, throat: *** mucous membranes moist. Midline trachea.  Cardiac: *** rate and rhythm.  Respiratory: *** unlabored. Abdominal: *** soft, non-tender, non-distended.  Peripheral vascular: *** Extremity: *** edema. *** cyanosis. *** pallor.  Skin: *** gangrene. *** ulceration.  Lymphatic: *** Stemmer's sign. *** palpable lymphadenopathy.    PERTINENT LABORATORY AND RADIOLOGIC DATA  Most recent CBC    Latest Ref Rng & Units 11/05/2021   12:00 AM 09/30/2021    3:24 PM 07/12/2021   12:07 PM  CBC  WBC 4.0 - 10.5 K/uL  9.2  10.3   Hemoglobin 13.5 - 17.5 11.6     12.5  12.4   Hematocrit 39.0 - 52.0 %  42.6  41.6   Platelets 150 - 400 K/uL  272  278      This result is from an external source.     Most recent CMP    Latest Ref Rng & Units 11/05/2021   12:00 AM 10/17/2021   11:29 AM 09/30/2021    3:24 PM  CMP  Glucose 70 - 99 mg/dL  77  91   BUN 6 - 23 mg/dL  74  55   Creatinine 0.6 - 1.3 6.3     6.43  6.73   Sodium 137 - 147 140     139  143   Potassium 3.5 - 5.1 mEq/L 3.7     3.4  3.1   Chloride 99 - 108 99     101  106   CO2 19 - 32 mEq/L  27  28   Calcium 8.7 - 10.7 8.8     8.6  9.2      This result is from an external source.    Renal function CrCl cannot be calculated (Unknown ideal weight.).  Hemoglobin A1C (no units)  Date Value  06/15/2021 12.0   Hgb A1c MFr Bld (%)  Date Value  07/12/2021 5.9 (H)    LDL Chol Calc (NIH)  Date Value Ref Range Status  07/12/2021 123 (H) 0 - 99 mg/dL Final  Vascular Imaging: ***  Frank Schlotter N. Stanford Breed, MD FACS [facility] Phone Number: [phone] 11/14/2021 5:36 PM   Total time spent on preparing this encounter including chart review, data review, collecting history, examining the patient, coordinating care for this {tnhtimebilling:26202}  Portions of this  report may have been transcribed using voice recognition software.  Every effort has been made to ensure accuracy; however, inadvertent computerized transcription errors may still be present.

## 2021-11-15 ENCOUNTER — Encounter: Payer: 59 | Admitting: Vascular Surgery

## 2021-11-22 ENCOUNTER — Encounter: Payer: Self-pay | Admitting: Vascular Surgery

## 2021-11-22 ENCOUNTER — Ambulatory Visit (INDEPENDENT_AMBULATORY_CARE_PROVIDER_SITE_OTHER): Payer: 59 | Admitting: Vascular Surgery

## 2021-11-22 VITALS — BP 110/78 | HR 75 | Temp 98.5°F | Resp 20 | Ht 68.5 in | Wt 376.5 lb

## 2021-11-22 DIAGNOSIS — N185 Chronic kidney disease, stage 5: Secondary | ICD-10-CM | POA: Diagnosis not present

## 2021-11-22 NOTE — Progress Notes (Signed)
[facility] [place]  ASSESSMENT / PLAN: Frank Coffey is a 41 y.o. right handed male in need of permanent dialysis access. I reviewed options for dialysis in detail with the patient, including hemodialysis and peritoneal dialysis. I counseled the patient to ask their nephrologist about their candidacy for renal transplant. I counseled the patient that dialysis access requires surveillance and periodic maintenance. Plan to proceed with laparoscopic peritoneal dialysis catheter as soon as nephrology team feels it wise to proceed.   CHIEF COMPLAINT: candidacy for PD   HISTORY OF PRESENT ILLNESS: Frank Coffey is a 41 y.o. male referred to clinic for consideration of placement of peritoneal dialysis catheter.  The patient has had a laparoscopic cholecystectomy, but no open abdominal surgery.  He is morbidly obese.  He has never had dialysis access before.  He is right-handed.  He reports he has not yet been told when he will need to initiate dialysis.  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)    IBS (irritable bowel syndrome)    Obesity    OSA (obstructive sleep apnea)    Pneumonia 04/2019   with Covid   Sleep apnea     Past Surgical History:  Procedure Laterality Date   BIOPSY  02/09/2020   Procedure: BIOPSY;  Surgeon: Irving Copas., MD;  Location: [facility] ENDOSCOPY;  Service: Gastroenterology;;   CHOLECYSTECTOMY     COLONOSCOPY WITH PROPOFOL N/A 02/09/2020   Procedure:  COLONOSCOPY WITH PROPOFOL;  Surgeon: Irving Copas., MD;  Location: [place];  Service: Gastroenterology;  Laterality: N/A;   ESOPHAGOGASTRODUODENOSCOPY (EGD) WITH PROPOFOL N/A 02/09/2020   Procedure: ESOPHAGOGASTRODUODENOSCOPY (EGD) WITH PROPOFOL;  Surgeon: Rush Landmark Telford Nab., MD;  Location: [place];  Service: Gastroenterology;  Laterality: N/A;   POLYPECTOMY  02/09/2020   Procedure: POLYPECTOMY;  Surgeon: Mansouraty, Telford Nab., MD;  Location: [facility] ENDOSCOPY;  Service: Gastroenterology;;   RETINAL DETACHMENT REPAIR W/ SCLERAL BUCKLE LE     Left    Family History  Problem Relation Age of Onset   Hypertension Mother    Hyperlipidemia Mother    Heart disease Mother    Kidney disease Mother    Depression Mother    Stomach cancer Father    Healthy Sister        x2   Healthy Brother        x2   Hypertension Maternal Grandmother    Stroke Maternal Grandmother    Heart disease Maternal Grandmother    Hyperlipidemia Maternal Grandmother    Stroke Maternal Grandfather    Hypertension Maternal Grandfather    Heart disease Maternal Grandfather    Hyperlipidemia Maternal Grandfather    Alzheimer's disease Maternal Grandfather    Cancer - Other Paternal Grandmother    Healthy Daughter        x1   Healthy Son        x2  Diabetes Neg Hx    Heart attack Neg Hx    Sudden death Neg Hx    Colon cancer Neg Hx    Esophageal cancer Neg Hx    Pancreatic cancer Neg Hx    Inflammatory bowel disease Neg Hx    Liver disease Neg Hx    Rectal cancer Neg Hx     Social History   Socioeconomic History   Marital status: Divorced    Spouse name: Not on file   Number of children: Not on file   Years of education: Not on file   Highest education level: Not on file  Occupational History   Not on file  Tobacco Use   Smoking status: Never   Smokeless tobacco: Never  Vaping Use   Vaping Use: Never used  Substance and Sexual Activity   Alcohol use: Not Currently     Comment: occ   Drug use: No   Sexual activity: Not on file  Other Topics Concern   Not on file  Social History Narrative   Epworth Sleepiness Scale = 10 (as of 12/01/14)   Social Determinants of Health   Financial Resource Strain: Not on file  Food Insecurity: Not on file  Transportation Needs: Not on file  Physical Activity: Not on file  Stress: Not on file  Social Connections: Not on file  Intimate Partner Violence: Not on file    No Known Allergies  Current Outpatient Medications  Medication Sig Dispense Refill   albuterol (VENTOLIN HFA) 108 (90 Base) MCG/ACT inhaler Inhale 1-2 puffs into the lungs every 6 (six) hours as needed for wheezing (cough). 8 g 2   allopurinol (ZYLOPRIM) 300 MG tablet TAKE 1 TABLET(300 MG) BY MOUTH DAILY 90 tablet 0   atorvastatin (LIPITOR) 20 MG tablet Take 1 tablet (20 mg total) by mouth daily. 90 tablet 3   benzonatate (TESSALON) 100 MG capsule Take 1 capsule (100 mg total) by mouth 3 (three) times daily as needed for cough. 30 capsule 0   brimonidine (ALPHAGAN) 0.2 % ophthalmic solution Place 1 drop into both eyes in the morning and at bedtime.     carvedilol (COREG) 25 MG tablet TAKE 2 TABLETS(50 MG) BY MOUTH TWICE DAILY 360 tablet 1   dorzolamide-timolol (COSOPT) 22.3-6.8 MG/ML ophthalmic solution Place 1 drop into both eyes in the morning and at bedtime.     EQ FIBER SUPPLEMENT PO Take 2 tablets by mouth daily.     hydrALAZINE (APRESOLINE) 100 MG tablet Take 1 tablet (100 mg total) by mouth 3 (three) times daily. 270 tablet 3   isosorbide mononitrate (IMDUR) 60 MG 24 hr tablet Take 1.5 tablets (90 mg total) by mouth daily. 135 tablet 3   latanoprost (XALATAN) 0.005 % ophthalmic solution Place 1 drop into the left eye at bedtime.     LINZESS 145 MCG CAPS capsule TAKE 1 CAPSULE(145 MCG) BY MOUTH DAILY BEFORE BREAKFAST. GIVEN** 90 capsule 3   olmesartan (BENICAR) 40 MG tablet Take 1 tablet (40 mg total) by mouth daily. 30 tablet 2   ondansetron  (ZOFRAN) 4 MG tablet Take 1 tablet (4 mg total) by mouth every 8 (eight) hours as needed for nausea or vomiting. 20 tablet 0   Potassium Chloride ER 20 MEQ TBCR Take 20 mEq by mouth daily. 60 tablet 2   prednisoLONE acetate (PRED FORTE) 1 % ophthalmic suspension 3 (three) times daily.     predniSONE (DELTASONE) 20 MG tablet Take 2 tablets (40 mg total) by mouth daily  with breakfast. 14 tablet 0   spironolactone (ALDACTONE) 25 MG tablet Take 1 tablet (25 mg total) by mouth daily. 30 tablet 2   torsemide (DEMADEX) 20 MG tablet Take 4 tablets (80 mg total) by mouth See admin instructions. Take 80 mg by mouth twice a day- morning and evening 60 tablet 2   Vitamin D, Ergocalciferol, (DRISDOL) 1.25 MG (50000 UNIT) CAPS capsule Take 50,000 Units by mouth every Wednesday.     omeprazole (PRILOSEC) 40 MG capsule Take 1 capsule (40 mg total) by mouth daily. 30 capsule 6   No current facility-administered medications for this visit.    PHYSICAL EXAM Vitals:   11/22/21 1454  BP: 110/78  Pulse: 75  Resp: 20  Temp: 98.5 F (36.9 C)  TempSrc: Temporal  SpO2: 96%  Weight: (!) 376 lb 8 oz (170.8 kg)  Height: 5' 8.5" (1.74 m)   Morbidly obese gentleman in no acute distress Regular rate and rhythm Unlabored breathing Soft nontender abdomen    PERTINENT LABORATORY AND RADIOLOGIC DATA  Most recent CBC    Latest Ref Rng & Units 11/05/2021   12:00 AM 09/30/2021    3:24 PM 07/12/2021   12:07 PM  CBC  WBC 4.0 - 10.5 K/uL  9.2  10.3   Hemoglobin 13.5 - 17.5 11.6     12.5  12.4   Hematocrit 39.0 - 52.0 %  42.6  41.6   Platelets 150 - 400 K/uL  272  278      This result is from an external source.     Most recent CMP    Latest Ref Rng & Units 11/05/2021   12:00 AM 10/17/2021   11:29 AM 09/30/2021    3:24 PM  CMP  Glucose 70 - 99 mg/dL  77  91   BUN 6 - 23 mg/dL  74  55   Creatinine 0.6 - 1.3 6.3     6.43  6.73   Sodium 137 - 147 140     139  143   Potassium 3.5 - 5.1 mEq/L 3.7     3.4   3.1   Chloride 99 - 108 99     101  106   CO2 19 - 32 mEq/L  27  28   Calcium 8.7 - 10.7 8.8     8.6  9.2      This result is from an external source.    Renal function Estimated Creatinine Clearance: 23.5 mL/min (A) (by C-G formula based on SCr of 6.3 mg/dL (A)).  Hemoglobin A1C (no units)  Date Value  06/15/2021 12.0   Hgb A1c MFr Bld (%)  Date Value  07/12/2021 5.9 (H)    LDL Chol Calc (NIH)  Date Value Ref Range Status  07/12/2021 123 (H) 0 - 99 mg/dL Final    Yevonne Aline. Stanford Breed, MD [facility] Phone Number: [phone] 11/22/2021 5:29 PM   Total time spent on preparing this encounter including chart review, data review, collecting history, examining the patient, coordinating care for this new patient, 45 minutes.  Portions of this report may have been transcribed using voice recognition software.  Every effort has been made to ensure accuracy; however, inadvertent computerized transcription errors may still be present.

## 2021-11-28 ENCOUNTER — Encounter: Payer: Self-pay | Admitting: Family Medicine

## 2021-12-06 ENCOUNTER — Emergency Department (HOSPITAL_BASED_OUTPATIENT_CLINIC_OR_DEPARTMENT_OTHER): Payer: 59

## 2021-12-06 ENCOUNTER — Inpatient Hospital Stay (HOSPITAL_BASED_OUTPATIENT_CLINIC_OR_DEPARTMENT_OTHER)
Admission: EM | Admit: 2021-12-06 | Discharge: 2021-12-12 | DRG: 674 | Disposition: A | Discharge status: Home / Self Care | Payer: 59 | Attending: Family Medicine | Admitting: Family Medicine

## 2021-12-06 ENCOUNTER — Encounter (HOSPITAL_COMMUNITY): Payer: Self-pay

## 2021-12-06 ENCOUNTER — Other Ambulatory Visit: Payer: Self-pay

## 2021-12-06 DIAGNOSIS — I959 Hypotension, unspecified: Secondary | ICD-10-CM | POA: Diagnosis not present

## 2021-12-06 DIAGNOSIS — K219 Gastro-esophageal reflux disease without esophagitis: Secondary | ICD-10-CM | POA: Diagnosis present

## 2021-12-06 DIAGNOSIS — I10 Essential (primary) hypertension: Secondary | ICD-10-CM | POA: Diagnosis present

## 2021-12-06 DIAGNOSIS — M1039 Gout due to renal impairment, multiple sites: Secondary | ICD-10-CM

## 2021-12-06 DIAGNOSIS — F4310 Post-traumatic stress disorder, unspecified: Secondary | ICD-10-CM | POA: Diagnosis present

## 2021-12-06 DIAGNOSIS — N189 Chronic kidney disease, unspecified: Secondary | ICD-10-CM | POA: Diagnosis present

## 2021-12-06 DIAGNOSIS — I129 Hypertensive chronic kidney disease with stage 1 through stage 4 chronic kidney disease, or unspecified chronic kidney disease: Secondary | ICD-10-CM | POA: Diagnosis present

## 2021-12-06 DIAGNOSIS — N186 End stage renal disease: Secondary | ICD-10-CM | POA: Diagnosis present

## 2021-12-06 DIAGNOSIS — I5189 Other ill-defined heart diseases: Secondary | ICD-10-CM

## 2021-12-06 DIAGNOSIS — E871 Hypo-osmolality and hyponatremia: Secondary | ICD-10-CM | POA: Diagnosis present

## 2021-12-06 DIAGNOSIS — N179 Acute kidney failure, unspecified: Secondary | ICD-10-CM | POA: Diagnosis not present

## 2021-12-06 DIAGNOSIS — E1169 Type 2 diabetes mellitus with other specified complication: Secondary | ICD-10-CM | POA: Diagnosis present

## 2021-12-06 DIAGNOSIS — Z8249 Family history of ischemic heart disease and other diseases of the circulatory system: Secondary | ICD-10-CM

## 2021-12-06 DIAGNOSIS — Z8673 Personal history of transient ischemic attack (TIA), and cerebral infarction without residual deficits: Secondary | ICD-10-CM

## 2021-12-06 DIAGNOSIS — R079 Chest pain, unspecified: Secondary | ICD-10-CM | POA: Diagnosis present

## 2021-12-06 DIAGNOSIS — Z7952 Long term (current) use of systemic steroids: Secondary | ICD-10-CM

## 2021-12-06 DIAGNOSIS — Z8616 Personal history of COVID-19: Secondary | ICD-10-CM

## 2021-12-06 DIAGNOSIS — Z992 Dependence on renal dialysis: Secondary | ICD-10-CM

## 2021-12-06 DIAGNOSIS — R0902 Hypoxemia: Secondary | ICD-10-CM | POA: Diagnosis present

## 2021-12-06 DIAGNOSIS — E875 Hyperkalemia: Secondary | ICD-10-CM | POA: Diagnosis not present

## 2021-12-06 DIAGNOSIS — M109 Gout, unspecified: Secondary | ICD-10-CM | POA: Diagnosis present

## 2021-12-06 DIAGNOSIS — G4733 Obstructive sleep apnea (adult) (pediatric): Secondary | ICD-10-CM | POA: Diagnosis present

## 2021-12-06 DIAGNOSIS — I132 Hypertensive heart and chronic kidney disease with heart failure and with stage 5 chronic kidney disease, or end stage renal disease: Secondary | ICD-10-CM | POA: Diagnosis present

## 2021-12-06 DIAGNOSIS — K581 Irritable bowel syndrome with constipation: Secondary | ICD-10-CM | POA: Diagnosis present

## 2021-12-06 DIAGNOSIS — Z6841 Body Mass Index (BMI) 40.0 and over, adult: Secondary | ICD-10-CM

## 2021-12-06 DIAGNOSIS — Z9049 Acquired absence of other specified parts of digestive tract: Secondary | ICD-10-CM

## 2021-12-06 DIAGNOSIS — R0789 Other chest pain: Secondary | ICD-10-CM | POA: Diagnosis present

## 2021-12-06 DIAGNOSIS — K5909 Other constipation: Secondary | ICD-10-CM | POA: Diagnosis present

## 2021-12-06 DIAGNOSIS — R7989 Other specified abnormal findings of blood chemistry: Secondary | ICD-10-CM | POA: Diagnosis present

## 2021-12-06 DIAGNOSIS — Z713 Dietary counseling and surveillance: Secondary | ICD-10-CM

## 2021-12-06 DIAGNOSIS — Z841 Family history of disorders of kidney and ureter: Secondary | ICD-10-CM

## 2021-12-06 DIAGNOSIS — I5042 Chronic combined systolic (congestive) and diastolic (congestive) heart failure: Secondary | ICD-10-CM | POA: Diagnosis present

## 2021-12-06 DIAGNOSIS — E872 Acidosis, unspecified: Secondary | ICD-10-CM | POA: Diagnosis present

## 2021-12-06 DIAGNOSIS — E785 Hyperlipidemia, unspecified: Secondary | ICD-10-CM | POA: Diagnosis present

## 2021-12-06 DIAGNOSIS — D631 Anemia in chronic kidney disease: Secondary | ICD-10-CM | POA: Diagnosis present

## 2021-12-06 DIAGNOSIS — Z79899 Other long term (current) drug therapy: Secondary | ICD-10-CM

## 2021-12-06 HISTORY — DX: Hyperkalemia: E87.5

## 2021-12-06 LAB — BASIC METABOLIC PANEL
Anion gap: 7 (ref 5–15)
Anion gap: 8 (ref 5–15)
BUN: 116 mg/dL — ABNORMAL HIGH (ref 6–20)
BUN: 114 mg/dL — ABNORMAL HIGH (ref 6–20)
CO2: 19 mmol/L — ABNORMAL LOW (ref 22–32)
CO2: 18 mmol/L — ABNORMAL LOW (ref 22–32)
Calcium: 8.6 mg/dL — ABNORMAL LOW (ref 8.9–10.3)
Calcium: 8.5 mg/dL — ABNORMAL LOW (ref 8.9–10.3)
Chloride: 105 mmol/L (ref 98–111)
Chloride: 107 mmol/L (ref 98–111)
Creatinine, Ser: 11.82 mg/dL — ABNORMAL HIGH (ref 0.61–1.24)
Creatinine, Ser: 11.33 mg/dL — ABNORMAL HIGH (ref 0.61–1.24)
GFR, Estimated: 5 mL/min — ABNORMAL LOW (ref >60)
GFR, Estimated: 5 mL/min — ABNORMAL LOW (ref >60)
Glucose, Bld: 102 mg/dL — ABNORMAL HIGH (ref 70–99)
Glucose, Bld: 84 mg/dL (ref 70–99)
Potassium: 6.6 mmol/L (ref 3.5–5.1)
Potassium: 5.6 mmol/L — ABNORMAL HIGH (ref 3.5–5.1)
Sodium: 131 mmol/L — ABNORMAL LOW (ref 135–145)
Sodium: 133 mmol/L — ABNORMAL LOW (ref 135–145)

## 2021-12-06 LAB — POTASSIUM: Potassium: 4.8 mmol/L (ref 3.5–5.1)

## 2021-12-06 LAB — MAGNESIUM: Magnesium: 2.3 mg/dL (ref 1.7–2.4)

## 2021-12-06 LAB — HEPATIC FUNCTION PANEL
ALT: 31 U/L (ref 0–44)
AST: 25 U/L (ref 15–41)
Albumin: 3.5 g/dL (ref 3.5–5.0)
Alkaline Phosphatase: 72 U/L (ref 38–126)
Bilirubin, Direct: 0.2 mg/dL (ref 0.0–0.2)
Indirect Bilirubin: 0.5 mg/dL (ref 0.3–0.9)
Total Bilirubin: 0.7 mg/dL (ref 0.3–1.2)
Total Protein: 8.7 g/dL — ABNORMAL HIGH (ref 6.5–8.1)

## 2021-12-06 LAB — TROPONIN I (HIGH SENSITIVITY)
Troponin I (High Sensitivity): 24 ng/L — ABNORMAL HIGH (ref <18)
Troponin I (High Sensitivity): 35 ng/L — ABNORMAL HIGH (ref <18)

## 2021-12-06 LAB — URINALYSIS, MICROSCOPIC (REFLEX): WBC, UA: NONE SEEN WBC/hpf (ref 0–5)

## 2021-12-06 LAB — CBC
HCT: 38 % — ABNORMAL LOW (ref 39.0–52.0)
Hemoglobin: 11.4 g/dL — ABNORMAL LOW (ref 13.0–17.0)
MCH: 23.6 pg — ABNORMAL LOW (ref 26.0–34.0)
MCHC: 30 g/dL (ref 30.0–36.0)
MCV: 78.7 fL — ABNORMAL LOW (ref 80.0–100.0)
Platelets: 220 10*3/uL (ref 150–400)
RBC: 4.83 MIL/uL (ref 4.22–5.81)
RDW: 18.6 % — ABNORMAL HIGH (ref 11.5–15.5)
WBC: 8.9 10*3/uL (ref 4.0–10.5)
nRBC: 0 % (ref 0.0–0.2)

## 2021-12-06 LAB — URINALYSIS, ROUTINE W REFLEX MICROSCOPIC
Bilirubin Urine: NEGATIVE
Glucose, UA: NEGATIVE mg/dL
Hgb urine dipstick: NEGATIVE
Ketones, ur: NEGATIVE mg/dL
Leukocytes,Ua: NEGATIVE
Nitrite: NEGATIVE
Protein, ur: 100 mg/dL — AB
Specific Gravity, Urine: 1.015 (ref 1.005–1.030)
pH: 5 (ref 5.0–8.0)

## 2021-12-06 LAB — PHOSPHORUS: Phosphorus: 6.7 mg/dL — ABNORMAL HIGH (ref 2.5–4.6)

## 2021-12-06 LAB — CBG MONITORING, ED
Glucose-Capillary: 87 mg/dL (ref 70–99)
Glucose-Capillary: 138 mg/dL — ABNORMAL HIGH (ref 70–99)

## 2021-12-06 MED ORDER — BRIMONIDINE TARTRATE 0.2 % OP SOLN
1.0000 [drp] | Freq: Three times a day (TID) | OPHTHALMIC | Status: DC
  Administered 2021-12-07 – 2021-12-12 (×13): 1 [drp] via OPHTHALMIC
  Filled 2021-12-06: qty 5

## 2021-12-06 MED ORDER — ALBUTEROL SULFATE (2.5 MG/3ML) 0.083% IN NEBU: 3.0000 mL | INHALATION_SOLUTION | Freq: Four times a day (QID) | RESPIRATORY_TRACT | Status: DC | PRN

## 2021-12-06 MED ORDER — SODIUM CHLORIDE 0.9 % IV BOLUS
1000.0000 mL | Freq: Once | INTRAVENOUS | Status: AC
  Administered 2021-12-06: 1000 mL via INTRAVENOUS

## 2021-12-06 MED ORDER — LATANOPROST 0.005 % OP SOLN
1.0000 [drp] | Freq: Every day | OPHTHALMIC | Status: DC
  Administered 2021-12-08 – 2021-12-11 (×5): 1 [drp] via OPHTHALMIC
  Filled 2021-12-06: qty 2.5

## 2021-12-06 MED ORDER — HYDRALAZINE HCL 20 MG/ML IJ SOLN
5.0000 mg | Freq: Four times a day (QID) | INTRAMUSCULAR | Status: DC | PRN
  Administered 2021-12-11 – 2021-12-12 (×2): 5 mg via INTRAVENOUS
  Filled 2021-12-06 (×2): qty 1

## 2021-12-06 MED ORDER — SODIUM BICARBONATE 8.4 % IV SOLN
INTRAVENOUS | Status: AC
  Filled 2021-12-06: qty 50

## 2021-12-06 MED ORDER — LINACLOTIDE 145 MCG PO CAPS
145.0000 ug | ORAL_CAPSULE | Freq: Every day | ORAL | Status: DC
  Administered 2021-12-08 – 2021-12-12 (×5): 145 ug via ORAL
  Filled 2021-12-06 (×5): qty 1

## 2021-12-06 MED ORDER — ATORVASTATIN CALCIUM 10 MG PO TABS
20.0000 mg | ORAL_TABLET | Freq: Every day | ORAL | Status: DC
  Administered 2021-12-07 – 2021-12-12 (×6): 20 mg via ORAL
  Filled 2021-12-06 (×6): qty 2

## 2021-12-06 MED ORDER — SODIUM CHLORIDE 0.45 % IV SOLN
INTRAVENOUS | Status: DC
  Filled 2021-12-06: qty 75

## 2021-12-06 MED ORDER — INSULIN ASPART 100 UNIT/ML IV SOLN
5.0000 [IU] | Freq: Once | INTRAVENOUS | Status: AC
  Administered 2021-12-06: 5 [IU] via INTRAVENOUS

## 2021-12-06 MED ORDER — SODIUM ZIRCONIUM CYCLOSILICATE 10 G PO PACK
10.0000 g | PACK | ORAL | Status: AC
  Administered 2021-12-06: 10 g via ORAL
  Filled 2021-12-06: qty 1

## 2021-12-06 MED ORDER — ALLOPURINOL 300 MG PO TABS
300.0000 mg | ORAL_TABLET | Freq: Every day | ORAL | Status: DC
  Administered 2021-12-07 – 2021-12-08 (×2): 300 mg via ORAL
  Filled 2021-12-06 (×2): qty 1

## 2021-12-06 MED ORDER — DORZOLAMIDE HCL-TIMOLOL MAL 2-0.5 % OP SOLN
1.0000 [drp] | Freq: Two times a day (BID) | OPHTHALMIC | Status: DC
  Administered 2021-12-07 – 2021-12-12 (×11): 1 [drp] via OPHTHALMIC
  Filled 2021-12-06: qty 10

## 2021-12-06 MED ORDER — CALCIUM GLUCONATE-NACL 1-0.675 GM/50ML-% IV SOLN
1.0000 g | Freq: Once | INTRAVENOUS | Status: AC
  Administered 2021-12-06: 1000 mg via INTRAVENOUS
  Filled 2021-12-06: qty 50

## 2021-12-06 MED ORDER — PANTOPRAZOLE SODIUM 40 MG PO TBEC
40.0000 mg | DELAYED_RELEASE_TABLET | Freq: Every day | ORAL | Status: DC
  Administered 2021-12-07 – 2021-12-12 (×6): 40 mg via ORAL
  Filled 2021-12-06 (×6): qty 1

## 2021-12-06 MED ORDER — ONDANSETRON HCL 4 MG PO TABS
4.0000 mg | ORAL_TABLET | Freq: Three times a day (TID) | ORAL | Status: DC | PRN
  Administered 2021-12-08: 4 mg via ORAL
  Filled 2021-12-06 (×2): qty 1

## 2021-12-06 MED ORDER — PREDNISOLONE ACETATE 1 % OP SUSP
1.0000 [drp] | Freq: Three times a day (TID) | OPHTHALMIC | Status: DC
  Administered 2021-12-07 – 2021-12-12 (×14): 1 [drp] via OPHTHALMIC
  Filled 2021-12-06: qty 5

## 2021-12-06 MED ORDER — BENZONATATE 100 MG PO CAPS: 100.0000 mg | ORAL_CAPSULE | Freq: Three times a day (TID) | ORAL | Status: DC | PRN

## 2021-12-06 MED ORDER — DEXTROSE 50 % IV SOLN
1.0000 | Freq: Once | INTRAVENOUS | Status: AC
  Administered 2021-12-06: 50 mL via INTRAVENOUS
  Filled 2021-12-06: qty 50

## 2021-12-06 NOTE — ED Notes (Signed)
US at bedside

## 2021-12-06 NOTE — Progress Notes (Signed)
Repeat K=6.6, patient still at [facility] ED, D/W ED PA, patient euvolemic and still making urine, will start slow Bicarb drip and send K repeat at 2000

## 2021-12-06 NOTE — ED Notes (Signed)
Dr aware of K 7.1 , nurse will re draw when she draws 2 nd trop

## 2021-12-06 NOTE — ED Notes (Signed)
Lab called to report pt potassium was 7.1. Notified EDP. Will redraw lab to verify

## 2021-12-06 NOTE — ED Notes (Signed)
Notified PA that repeat K was noted to be 6.6

## 2021-12-06 NOTE — ED Triage Notes (Signed)
Pt states decreased appetite for a few days, right sided chest pain started this morning and left hand numbness. Headache started this morning. Denies N/V/D denies SOB. No meds PTA.

## 2021-12-06 NOTE — ED Provider Notes (Signed)
[facility] EMERGENCY DEPARTMENT Provider Note   CSN: [id number] Arrival date & time: 12/06/21  1102     History  Chief Complaint  Patient presents with   Chest Pain    Frank Coffey is a 41 y.o. male.   Chest Pain  Patient is a 41 yo male with a hx of CKD, OSA, CHF (echo 1 month ago w35-40%), HTN, TIA, GERD presents with multiple symptoms he states that he has felt fatigued and generally weak.  He has had decreased appetite for the past 1 week and has had some headaches.  He denies any nausea vomiting or diarrhea.  He states he is not feel short of breath but does feel generally low energy.  He states that this morning he experienced some dull right sided chest pain. The pain is worse with movement and when laying down but is neither exertional or pleuritic. He denies any trauma to the area, and has never had this type of pain before.He denies any fever, night sweats, abdominal pain, SOB, cough, hemoptysis, or nausea/vomiting, but endorses chills.   At the time of taking this history patient states he does not have any chest pain.  Patient denies any heavy ibuprofen use.  He states he is taking his medications as prescribed    Home Medications Prior to Admission medications   Medication Sig Start Date End Date Taking? Authorizing Provider  albuterol (VENTOLIN HFA) 108 (90 Base) MCG/ACT inhaler Inhale 1-2 puffs into the lungs every 6 (six) hours as needed for wheezing (cough). 06/09/21   Libby Maw, MD  allopurinol (ZYLOPRIM) 300 MG tablet TAKE 1 TABLET(300 MG) BY MOUTH DAILY 05/31/19   Elby Beck, FNP  atorvastatin (LIPITOR) 20 MG tablet Take 1 tablet (20 mg total) by mouth daily. 05/29/18   Elby Beck, FNP  benzonatate (TESSALON) 100 MG capsule Take 1 capsule (100 mg total) by mouth 3 (three) times daily as needed for cough. 06/07/21   Apolonio Schneiders, FNP  brimonidine (ALPHAGAN) 0.2 % ophthalmic solution Place 1 drop into both eyes in the morning  and at bedtime. 03/19/19   [provider]  carvedilol (COREG) 25 MG tablet TAKE 2 TABLETS(50 MG) BY MOUTH TWICE DAILY 07/11/21   Chandrasekhar, Mahesh A, MD  dorzolamide-timolol (COSOPT) 22.3-6.8 MG/ML ophthalmic solution Place 1 drop into both eyes in the morning and at bedtime. 03/10/19   [provider]  EQ FIBER SUPPLEMENT PO Take 2 tablets by mouth daily.    [provider]  hydrALAZINE (APRESOLINE) 100 MG tablet Take 1 tablet (100 mg total) by mouth 3 (three) times daily. 10/20/20   Nche, Charlene Brooke, NP  isosorbide mononitrate (IMDUR) 60 MG 24 hr tablet Take 1.5 tablets (90 mg total) by mouth daily. 11/09/21   Chandrasekhar, Mahesh A, MD  latanoprost (XALATAN) 0.005 % ophthalmic solution Place 1 drop into the left eye at bedtime. 04/14/19   [provider]  LINZESS 145 MCG CAPS capsule TAKE 1 CAPSULE(145 MCG) BY MOUTH DAILY BEFORE BREAKFAST. GIVEN** 10/31/21   Nche, Charlene Brooke, NP  olmesartan (BENICAR) 40 MG tablet Take 1 tablet (40 mg total) by mouth daily. 10/31/21   Nche, Charlene Brooke, NP  omeprazole (PRILOSEC) 40 MG capsule Take 1 capsule (40 mg total) by mouth daily. 02/09/20 10/19/21  Mansouraty, Telford Nab., MD  ondansetron (ZOFRAN) 4 MG tablet Take 1 tablet (4 mg total) by mouth every 8 (eight) hours as needed for nausea or vomiting. 05/09/21   Mar Daring,  PA-C  Potassium Chloride ER 20 MEQ TBCR Take 20 mEq by mouth daily. 10/17/21   Libby Maw, MD  prednisoLONE acetate (PRED FORTE) 1 % ophthalmic suspension 3 (three) times daily. 03/10/21   [provider]  predniSONE (DELTASONE) 20 MG tablet Take 2 tablets (40 mg total) by mouth daily with breakfast. 11/14/21   Mar Daring, PA-C  spironolactone (ALDACTONE) 25 MG tablet Take 1 tablet (25 mg total) by mouth daily. 10/31/21   Nche, Charlene Brooke, NP  torsemide (DEMADEX) 20 MG tablet Take 4 tablets (80 mg total) by mouth See admin instructions. Take 80 mg by mouth  twice a day- morning and evening 10/17/21 10/12/22  Libby Maw, MD  Vitamin D, Ergocalciferol, (DRISDOL) 1.25 MG (50000 UNIT) CAPS capsule Take 50,000 Units by mouth every Wednesday. 08/12/21   [provider]      Allergies    Patient has no known allergies.    Review of Systems   Review of Systems  Cardiovascular:  Positive for chest pain.    Physical Exam Updated Vital Signs BP (!) 103/53   Pulse 68   Temp 98 F (36.7 C)   Resp (!) 22   Ht '5\' 8"'$  (1.727 m)   Wt (!) 167.8 kg   SpO2 95%   BMI 56.26 kg/m  Physical Exam Vitals and nursing note reviewed.  Constitutional:      General: He is not in acute distress.    Appearance: He is obese.  HENT:     Head: Normocephalic and atraumatic.     Nose: Nose normal.     Mouth/Throat:     Mouth: Mucous membranes are moist.  Eyes:     General: No scleral icterus. Neck:     Comments: No JVP Cardiovascular:     Rate and Rhythm: Normal rate and regular rhythm.     Pulses: Normal pulses.     Heart sounds: Normal heart sounds.  Pulmonary:     Effort: Pulmonary effort is normal. No respiratory distress.     Breath sounds: No wheezing.  Abdominal:     Palpations: Abdomen is soft.     Tenderness: There is no abdominal tenderness. There is no guarding or rebound.  Musculoskeletal:     Cervical back: Normal range of motion.     Right lower leg: No edema.     Left lower leg: No edema.  Skin:    General: Skin is warm and dry.     Capillary Refill: Capillary refill takes less than 2 seconds.  Neurological:     Mental Status: He is alert. Mental status is at baseline.  Psychiatric:        Mood and Affect: Mood normal.        Behavior: Behavior normal.     ED Results / Procedures / Treatments   Labs (all labs ordered are listed, but only abnormal results are displayed) Labs Reviewed  BASIC METABOLIC PANEL - Abnormal; Notable for the following components:      Result Value   Sodium 131 (*)    Potassium 6.6  (*)    CO2 19 (*)    Glucose, Bld 102 (*)    BUN 116 (*)    Creatinine, Ser 11.82 (*)    Calcium 8.6 (*)    GFR, Estimated 5 (*)    All other components within normal limits  CBC - Abnormal; Notable for the following components:   Hemoglobin 11.4 (*)    HCT 38.0 (*)  MCV 78.7 (*)    MCH 23.6 (*)    RDW 18.6 (*)    All other components within normal limits  HEPATIC FUNCTION PANEL - Abnormal; Notable for the following components:   Total Protein 8.7 (*)    All other components within normal limits  URINALYSIS, ROUTINE W REFLEX MICROSCOPIC - Abnormal; Notable for the following components:   Protein, ur 100 (*)    All other components within normal limits  URINALYSIS, MICROSCOPIC (REFLEX) - Abnormal; Notable for the following components:   Bacteria, UA RARE (*)    All other components within normal limits  PHOSPHORUS - Abnormal; Notable for the following components:   Phosphorus 6.7 (*)    All other components within normal limits  BASIC METABOLIC PANEL - Abnormal; Notable for the following components:   Sodium 133 (*)    Potassium 5.6 (*)    CO2 18 (*)    BUN 114 (*)    Creatinine, Ser 11.33 (*)    Calcium 8.5 (*)    GFR, Estimated 5 (*)    All other components within normal limits  CBG MONITORING, ED - Abnormal; Notable for the following components:   Glucose-Capillary 138 (*)    All other components within normal limits  TROPONIN I (HIGH SENSITIVITY) - Abnormal; Notable for the following components:   Troponin I (High Sensitivity) 24 (*)    All other components within normal limits  TROPONIN I (HIGH SENSITIVITY) - Abnormal; Notable for the following components:   Troponin I (High Sensitivity) 35 (*)    All other components within normal limits  MAGNESIUM  BASIC METABOLIC PANEL  POTASSIUM  CBG MONITORING, ED    EKG EKG Interpretation  Date/Time:  Tuesday December 06 2021 11:11:31 EST Ventricular Rate:  67 PR Interval:  200 QRS Duration: 114 QT  Interval:  426 QTC Calculation: 450 R Axis:   -5 Text Interpretation: Normal sinus rhythm Minimal voltage criteria for LVH, may be normal variant ( Cornell product ) Borderline ECG  T-wave inversions in V4-V6 now resolved compared to prior EKG Confirmed by Leanord Asal ([id number]) on 12/06/2021 11:25:57 AM  Radiology DG Chest 2 View  Result Date: 12/06/2021 CLINICAL DATA:  Chest pain. EXAM: CHEST - 2 VIEW COMPARISON:  August 10, 2020. FINDINGS: The heart size and mediastinal contours are within normal limits. Both lungs are clear. The visualized skeletal structures are unremarkable. IMPRESSION: No active cardiopulmonary disease. Electronically Signed   By: Marijo Conception M.D.   On: 12/06/2021 12:23    Procedures .Critical Care  Performed by: Tedd Sias, PA Authorized by: Tedd Sias, PA   Critical care provider statement:    Critical care time (minutes):  35   Critical care time was exclusive of:  Separately billable procedures and treating other patients and teaching time   Critical care was necessary to treat or prevent imminent or life-threatening deterioration of the following conditions:  Renal failure   Critical care was time spent personally by me on the following activities:  Development of treatment plan with patient or surrogate, review of old charts, re-evaluation of patient's condition, pulse oximetry, ordering and review of radiographic studies, ordering and review of laboratory studies, ordering and performing treatments and interventions, obtaining history from patient or surrogate, examination of patient and evaluation of patient's response to treatment   Care discussed with: admitting provider       Medications Ordered in ED Medications  allopurinol (ZYLOPRIM) tablet 300 mg (has no administration in time  range)  atorvastatin (LIPITOR) tablet 20 mg (has no administration in time range)  hydrALAZINE (APRESOLINE) injection 5 mg (has no administration in time  range)  linaclotide (LINZESS) capsule 145 mcg (has no administration in time range)  pantoprazole (PROTONIX) EC tablet 40 mg (has no administration in time range)  ondansetron (ZOFRAN) tablet 4 mg (has no administration in time range)  albuterol (PROVENTIL) (2.5 MG/3ML) 0.083% nebulizer solution 3 mL (has no administration in time range)  benzonatate (TESSALON) capsule 100 mg (has no administration in time range)  brimonidine (ALPHAGAN) 0.2 % ophthalmic solution 1 drop (1 drop Both Eyes Not Given 12/06/21 1609)  dorzolamide-timolol (COSOPT) 2-0.5 % ophthalmic solution 1 drop (1 drop Both Eyes Not Given 12/06/21 1556)  latanoprost (XALATAN) 0.005 % ophthalmic solution 1 drop (has no administration in time range)  prednisoLONE acetate (PRED FORTE) 1 % ophthalmic suspension 1 drop (1 drop Both Eyes Not Given 12/06/21 1608)  calcium gluconate 1 g/ 50 mL sodium chloride IVPB (0 mg Intravenous Stopped 12/06/21 1444)  sodium chloride 0.9 % bolus 1,000 mL (0 mLs Intravenous Stopped 12/06/21 1701)  sodium zirconium cyclosilicate (LOKELMA) packet 10 g (10 g Oral Given 12/06/21 1455)  insulin aspart (novoLOG) injection 5 Units (5 Units Intravenous Given 12/06/21 1447)    And  dextrose 50 % solution 50 mL (50 mLs Intravenous Given 12/06/21 1447)    ED Course/ Medical Decision Making/ A&P Clinical Course as of 12/06/21 1804  Tue Dec 06, 2021  1501 Dr. Joylene Grapes nephrology aware [WF]    Clinical Course User Index [WF] Tedd Sias, PA                           Medical Decision Making Amount and/or Complexity of Data Reviewed Labs: ordered. Radiology: ordered.  Risk OTC drugs. Prescription drug management. Decision regarding hospitalization.   This patient presents to the ED for concern of fatigue chest pain weakness, this involves a number of treatment options, and is a complaint that carries with it a moderate to high risk of complications and morbidity. A differential diagnosis was  considered for the patient's symptoms which is discussed below:   The emergent causes of chest pain include: Acute coronary syndrome, tamponade, pericarditis/myocarditis, aortic dissection, pulmonary embolism, tension pneumothorax, pneumonia, and esophageal rupture.  The differential diagnosis of weakness includes but is not limited to neurologic causes (GBS, myasthenia gravis, CVA, MS, ALS, transverse myelitis, spinal cord injury, CVA, botulism, ) and other causes: ACS, Arrhythmia, syncope, orthostatic hypotension, sepsis, hypoglycemia, electrolyte disturbance, hypothyroidism, respiratory failure, symptomatic anemia, dehydration, heat injury, polypharmacy, malignancy.    Co morbidities: Discussed in HPI   Brief History:  Patient is a 41 yo male with a hx of CKD, OSA, CHF (echo 1 month ago w35-40%), HTN, TIA, GERD presents with multiple symptoms he states that he has felt fatigued and generally weak.  He has had decreased appetite for the past 1 week and has had some headaches.  He denies any nausea vomiting or diarrhea.  He states he is not feel short of breath but does feel generally low energy.  He states that this morning he experienced some dull right sided chest pain. The pain is worse with movement and when laying down but is neither exertional or pleuritic. He denies any trauma to the area, and has never had this type of pain before.He denies any fever, night sweats, abdominal pain, SOB, cough, hemoptysis, or nausea/vomiting, but endorses chills.   At  the time of taking this history patient states he does not have any chest pain.  Patient denies any heavy ibuprofen use.  He states he is taking his medications as prescribed    EMR reviewed including pt PMHx, past surgical history and past visits to ER.   See HPI for more details   Lab Tests:   I ordered and independently interpreted labs. Labs notable for Potassium of 6.6 initially this was treated and repeat 5.6 Does have  peaked T waves  Magnesium within normal limits phosphorus mildly elevated troponins without significant elevation.  No current chest pain on reevaluation.  Creatinine has doubled since prior visit to ER.  Patient is still producing urine.  CBC with anemia no leukocytosis.  LFTs approximately normal  Imaging Studies:  Renal ultrasound ordered by me--ultrasound currently not available.  Will be obtained at [facility] upon transfer.  Chest x-ray without acute abnormal finding.    Cardiac Monitoring:  The patient was maintained on a cardiac monitor.  I personally viewed and interpreted the cardiac monitored which showed an underlying rhythm of: NSR EKG non-ischemic peaked T waves   Medicines ordered:  I ordered medication including 1 L normal saline, Lokelma, dextrose and insulin, calcium gluconate for hyperkalemia Reevaluation of the patient after these medicines showed that the patient improved I have reviewed the patients home medicines and have made adjustments as needed   Critical Interventions:     Consults/Attending Physician   I requested consultation with Dr. Osborne Casco of nephrology,  and discussed lab and imaging findings as well as pertinent plan - they recommend: Admission to [facility].  They were are aware of patient.  They are in agreement with current treatment for hyperkalemia   Reevaluation:  After the interventions noted above I re-evaluated patient and found that they have :improved   Social Determinants of Health:      Problem List / ED Course:  Acute renal failure superimposed on CKD.  Discussed with Dr. Osborne Casco of nephrology who is aware of patient.  Discussed with Dr. Roosevelt Locks of hospitalist service who will admit.  Potassium much improved after initial treatment.   Dispostion:  After consideration of the diagnostic results and the patients response to treatment, I feel that the patent would benefit from admission.   Oncoming team aware of pt. Will  monitor.    Final Clinical Impression(s) / ED Diagnoses Final diagnoses:  Acute renal failure superimposed on chronic kidney disease, unspecified acute renal failure type, unspecified CKD stage (HCC)  Hyperkalemia    Rx / DC Orders ED Discharge Orders     None         Tedd Sias, [place] 12/06/21 1808    Kemper Durie, DO 12/08/21 1559

## 2021-12-07 ENCOUNTER — Encounter (HOSPITAL_COMMUNITY): Payer: Self-pay | Admitting: Internal Medicine

## 2021-12-07 ENCOUNTER — Inpatient Hospital Stay (HOSPITAL_COMMUNITY): Payer: 59

## 2021-12-07 DIAGNOSIS — E785 Hyperlipidemia, unspecified: Secondary | ICD-10-CM | POA: Diagnosis present

## 2021-12-07 DIAGNOSIS — N185 Chronic kidney disease, stage 5: Secondary | ICD-10-CM

## 2021-12-07 DIAGNOSIS — R0789 Other chest pain: Secondary | ICD-10-CM | POA: Diagnosis present

## 2021-12-07 DIAGNOSIS — G4733 Obstructive sleep apnea (adult) (pediatric): Secondary | ICD-10-CM | POA: Diagnosis present

## 2021-12-07 DIAGNOSIS — R079 Chest pain, unspecified: Secondary | ICD-10-CM

## 2021-12-07 DIAGNOSIS — I5042 Chronic combined systolic (congestive) and diastolic (congestive) heart failure: Secondary | ICD-10-CM | POA: Diagnosis present

## 2021-12-07 DIAGNOSIS — Z992 Dependence on renal dialysis: Secondary | ICD-10-CM | POA: Diagnosis not present

## 2021-12-07 DIAGNOSIS — I5189 Other ill-defined heart diseases: Secondary | ICD-10-CM | POA: Diagnosis not present

## 2021-12-07 DIAGNOSIS — E875 Hyperkalemia: Secondary | ICD-10-CM

## 2021-12-07 DIAGNOSIS — N186 End stage renal disease: Secondary | ICD-10-CM | POA: Diagnosis present

## 2021-12-07 DIAGNOSIS — Z9049 Acquired absence of other specified parts of digestive tract: Secondary | ICD-10-CM | POA: Diagnosis not present

## 2021-12-07 DIAGNOSIS — Z8616 Personal history of COVID-19: Secondary | ICD-10-CM | POA: Diagnosis not present

## 2021-12-07 DIAGNOSIS — R7989 Other specified abnormal findings of blood chemistry: Secondary | ICD-10-CM | POA: Diagnosis present

## 2021-12-07 DIAGNOSIS — D631 Anemia in chronic kidney disease: Secondary | ICD-10-CM | POA: Diagnosis present

## 2021-12-07 DIAGNOSIS — E871 Hypo-osmolality and hyponatremia: Secondary | ICD-10-CM | POA: Diagnosis present

## 2021-12-07 DIAGNOSIS — I10 Essential (primary) hypertension: Secondary | ICD-10-CM

## 2021-12-07 DIAGNOSIS — E872 Acidosis, unspecified: Secondary | ICD-10-CM | POA: Diagnosis present

## 2021-12-07 DIAGNOSIS — I959 Hypotension, unspecified: Secondary | ICD-10-CM | POA: Diagnosis not present

## 2021-12-07 DIAGNOSIS — K581 Irritable bowel syndrome with constipation: Secondary | ICD-10-CM

## 2021-12-07 DIAGNOSIS — N179 Acute kidney failure, unspecified: Secondary | ICD-10-CM

## 2021-12-07 DIAGNOSIS — R0902 Hypoxemia: Secondary | ICD-10-CM | POA: Diagnosis present

## 2021-12-07 DIAGNOSIS — Z79899 Other long term (current) drug therapy: Secondary | ICD-10-CM | POA: Diagnosis not present

## 2021-12-07 DIAGNOSIS — N189 Chronic kidney disease, unspecified: Secondary | ICD-10-CM

## 2021-12-07 DIAGNOSIS — K219 Gastro-esophageal reflux disease without esophagitis: Secondary | ICD-10-CM | POA: Diagnosis present

## 2021-12-07 DIAGNOSIS — Z6841 Body Mass Index (BMI) 40.0 and over, adult: Secondary | ICD-10-CM | POA: Diagnosis not present

## 2021-12-07 DIAGNOSIS — F4310 Post-traumatic stress disorder, unspecified: Secondary | ICD-10-CM | POA: Diagnosis present

## 2021-12-07 DIAGNOSIS — M109 Gout, unspecified: Secondary | ICD-10-CM | POA: Diagnosis present

## 2021-12-07 DIAGNOSIS — K5909 Other constipation: Secondary | ICD-10-CM | POA: Diagnosis present

## 2021-12-07 DIAGNOSIS — I132 Hypertensive heart and chronic kidney disease with heart failure and with stage 5 chronic kidney disease, or end stage renal disease: Secondary | ICD-10-CM | POA: Diagnosis present

## 2021-12-07 HISTORY — DX: Chest pain, unspecified: R07.9

## 2021-12-07 HISTORY — PX: IR US GUIDE VASC ACCESS RIGHT: IMG2390

## 2021-12-07 HISTORY — PX: IR FLUORO GUIDE CV LINE RIGHT: IMG2283

## 2021-12-07 LAB — BASIC METABOLIC PANEL
Anion gap: 7 (ref 5–15)
BUN: 118 mg/dL — ABNORMAL HIGH (ref 6–20)
CO2: 20 mmol/L — ABNORMAL LOW (ref 22–32)
Calcium: 8.6 mg/dL — ABNORMAL LOW (ref 8.9–10.3)
Chloride: 107 mmol/L (ref 98–111)
Creatinine, Ser: 11.18 mg/dL — ABNORMAL HIGH (ref 0.61–1.24)
GFR, Estimated: 5 mL/min — ABNORMAL LOW (ref >60)
Glucose, Bld: 115 mg/dL — ABNORMAL HIGH (ref 70–99)
Potassium: 5.3 mmol/L — ABNORMAL HIGH (ref 3.5–5.1)
Sodium: 134 mmol/L — ABNORMAL LOW (ref 135–145)

## 2021-12-07 LAB — HEPATITIS B SURFACE ANTIGEN: Hepatitis B Surface Ag: NONREACTIVE

## 2021-12-07 LAB — GLUCOSE, CAPILLARY: Glucose-Capillary: 178 mg/dL — ABNORMAL HIGH (ref 70–99)

## 2021-12-07 MED ORDER — FENTANYL CITRATE (PF) 100 MCG/2ML IJ SOLN
INTRAMUSCULAR | Status: AC
  Filled 2021-12-07: qty 2

## 2021-12-07 MED ORDER — ACETAMINOPHEN 325 MG PO TABS
650.0000 mg | ORAL_TABLET | Freq: Four times a day (QID) | ORAL | Status: DC | PRN
  Administered 2021-12-07 – 2021-12-12 (×9): 650 mg via ORAL
  Filled 2021-12-07 (×11): qty 2

## 2021-12-07 MED ORDER — HEPARIN SODIUM (PORCINE) 1000 UNIT/ML IJ SOLN
INTRAMUSCULAR | Status: AC
  Filled 2021-12-07: qty 10

## 2021-12-07 MED ORDER — SODIUM CHLORIDE 0.9% FLUSH
3.0000 mL | Freq: Two times a day (BID) | INTRAVENOUS | Status: DC
  Administered 2021-12-07 – 2021-12-12 (×9): 3 mL via INTRAVENOUS

## 2021-12-07 MED ORDER — ACETAMINOPHEN 650 MG RE SUPP
650.0000 mg | Freq: Four times a day (QID) | RECTAL | Status: DC | PRN
  Filled 2021-12-07: qty 1

## 2021-12-07 MED ORDER — CEFAZOLIN SODIUM-DEXTROSE 2-4 GM/100ML-% IV SOLN
INTRAVENOUS | Status: AC
  Filled 2021-12-07: qty 100

## 2021-12-07 MED ORDER — CEFAZOLIN SODIUM-DEXTROSE 2-4 GM/100ML-% IV SOLN
INTRAVENOUS | Status: AC | PRN
  Administered 2021-12-07: 2 g via INTRAVENOUS

## 2021-12-07 MED ORDER — HEPARIN SODIUM (PORCINE) 5000 UNIT/ML IJ SOLN
5000.0000 [IU] | Freq: Three times a day (TID) | INTRAMUSCULAR | Status: DC
  Administered 2021-12-08 – 2021-12-12 (×14): 5000 [IU] via SUBCUTANEOUS
  Filled 2021-12-07 (×13): qty 1

## 2021-12-07 MED ORDER — SODIUM BICARBONATE 650 MG PO TABS
650.0000 mg | ORAL_TABLET | Freq: Two times a day (BID) | ORAL | Status: DC
  Administered 2021-12-07 – 2021-12-08 (×3): 650 mg via ORAL
  Filled 2021-12-07 (×3): qty 1

## 2021-12-07 MED ORDER — CHLORHEXIDINE GLUCONATE CLOTH 2 % EX PADS
6.0000 | MEDICATED_PAD | Freq: Every day | CUTANEOUS | Status: DC
  Administered 2021-12-08 – 2021-12-11 (×4): 6 via TOPICAL

## 2021-12-07 MED ORDER — HEPARIN SODIUM (PORCINE) 1000 UNIT/ML DIALYSIS
1000.0000 [IU] | INTRAMUSCULAR | Status: DC | PRN
  Administered 2021-12-07: 1000 [IU] via INTRAVENOUS_CENTRAL
  Filled 2021-12-07: qty 1

## 2021-12-07 MED ORDER — FENTANYL CITRATE (PF) 100 MCG/2ML IJ SOLN
INTRAMUSCULAR | Status: AC | PRN
  Administered 2021-12-07: 25 ug via INTRAVENOUS
  Administered 2021-12-07: 50 ug via INTRAVENOUS
  Administered 2021-12-07: 25 ug via INTRAVENOUS

## 2021-12-07 MED ORDER — LIDOCAINE-EPINEPHRINE 1 %-1:100000 IJ SOLN
INTRAMUSCULAR | Status: AC
  Administered 2021-12-07: 10 mL
  Filled 2021-12-07: qty 1

## 2021-12-07 NOTE — Progress Notes (Signed)
Patient does not want to wear BiPAP on his home settings at this time.  Patient agreed to wear Salter Hi-Flo nasal cannula.  RT placed patient on Salter 10 liter Hi-Flo nasal cannula.  RT will continue to monitor.

## 2021-12-07 NOTE — Progress Notes (Signed)
Requested to see pt for out-pt HD needs at d/c. Met with pt at bedside. Introduced self and explained role. Discussed with pt possible out-pt HD options. Pt is interested in home therapies in the future. Discussed TCU at d/c for HD treatments and home therapy education. Pt voices interest in TCU and agreeable to 4x's a week treatments/education for approx 30 days. Referral made to [facility] admissions today for TCU chair at [facility]. Available clinicals provided to [facility] but additional info will be required once available (H and P,Hep B antibody, procedure note for [facility]). Pt states he works and drives. [facility] admissions closed tomorrow for holiday but will be available Friday [facility] [phone]). Of note, navigator will be out of the office on Thursday and Friday but admissions can be reached directly on Friday at above number. Will assist as needed.   Melven Sartorius Renal Navigator [phone]

## 2021-12-07 NOTE — Progress Notes (Signed)
Mobility Specialist - Progress Note   12/07/21 1225  Mobility  Activity Ambulated with assistance to bathroom  Level of Assistance Standby assist, set-up cues, supervision of patient - no hands on  Assistive Device None  Distance Ambulated (ft) 20 ft  Activity Response Tolerated well  Mobility Referral No  $Mobility charge 1 Mobility   Pt received in bed and requesting assistance to BR. No complaints throughout. Pt left in chair wit all needs met.   Franki Monte  Mobility Specialist Please contact via Solicitor or Rehab office at [phone]

## 2021-12-07 NOTE — Progress Notes (Addendum)
Patient was in dialysis but patient's family member (wife) stated that he did not wish to wear a BIPAP at night. There was also no unit in room. Told her to have him call if he changed his mind.

## 2021-12-07 NOTE — H&P (Signed)
History and Physical    Patient: Frank Coffey ZOX:[id number] DOB: January 14, 1981 DOA: 12/06/2021 DOS: the patient was seen and examined on 12/07/2021 PCP: Flossie Buffy, NP  Patient coming from: transfer from [place]   Chief Complaint:  Chief Complaint  Patient presents with   Chest Pain   HPI: Frank Coffey is a 41 y.o. 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  Patient reports that he has been sleeping more than normal.  Associated symptoms included nausea, substernal/right-sided chest discomfort, bilateral leg pains, and constipation.  Denies having any recent fall/trauma, vomiting, cough, shortness of breath, or significant leg swelling.  He reports that he has been able to make urine.  He is followed by Dr. Jimmy Footman of [facility], but states that he has been switched over to Dr. Posey Pronto after he left.  Ultimately he would like to be on peritoneal dialysis.   In the emergency department patient was noted to be afebrile with tachypnea, and hypoxic down to 88% placed on high flow nasal cannula oxygen.  Initial labs yesterday revealed potassium 6.6, CO2 19, BUN 116, creatinine 11.82, and high-sensitivity troponin 24->35.  Patient has been given calcium gluconate, 5 units of insulin, dextrose,  and Lokelma 10 g p.o.  Repeat potassium had been noted to be 5.6->4.8. Nephrology consulted and placed orders for IR for tunneled hemodialysis catheter.  Review of Systems: As mentioned in the history of present illness. All other systems reviewed and are negative. 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)    IBS (irritable bowel syndrome)    Obesity    OSA (obstructive sleep apnea)    Pneumonia 04/2019   with Covid   Sleep apnea    Past Surgical History:  Procedure Laterality Date   BIOPSY  02/09/2020   Procedure: BIOPSY;  Surgeon: Irving Copas., MD;  Location: [facility] ENDOSCOPY;  Service: Gastroenterology;;   CHOLECYSTECTOMY     COLONOSCOPY WITH PROPOFOL N/A 02/09/2020   Procedure: COLONOSCOPY WITH PROPOFOL;  Surgeon: Irving Copas., MD;  Location: [place];  Service: Gastroenterology;  Laterality: N/A;   ESOPHAGOGASTRODUODENOSCOPY (EGD) WITH PROPOFOL N/A 02/09/2020   Procedure: ESOPHAGOGASTRODUODENOSCOPY (EGD) WITH PROPOFOL;  Surgeon: Rush Landmark Telford Nab., MD;  Location: [place];  Service: Gastroenterology;  Laterality: N/A;   POLYPECTOMY  02/09/2020   Procedure: POLYPECTOMY;  Surgeon: Mansouraty, Telford Nab., MD;  Location: [place];  Service: Gastroenterology;;   RETINAL DETACHMENT REPAIR W/ SCLERAL BUCKLE LE     Left   Social History:  reports that he has never smoked. He has never used smokeless tobacco. He reports that he does not currently use alcohol. He reports that he does not use drugs.  No Known Allergies  Family History  Problem Relation Age of Onset   Hypertension Mother  Hyperlipidemia Mother    Heart disease Mother    Kidney disease Mother    Depression Mother    Stomach cancer Father    Healthy Sister        x2   Healthy Brother        x2   Hypertension Maternal Grandmother    Stroke Maternal Grandmother    Heart disease Maternal Grandmother    Hyperlipidemia Maternal Grandmother    Stroke Maternal Grandfather    Hypertension Maternal Grandfather    Heart disease Maternal  Grandfather    Hyperlipidemia Maternal Grandfather    Alzheimer's disease Maternal Grandfather    Cancer - Other Paternal Grandmother    Healthy Daughter        x1   Healthy Son        x2   Diabetes Neg Hx    Heart attack Neg Hx    Sudden death Neg Hx    Colon cancer Neg Hx    Esophageal cancer Neg Hx    Pancreatic cancer Neg Hx    Inflammatory bowel disease Neg Hx    Liver disease Neg Hx    Rectal cancer Neg Hx     Prior to Admission medications   Medication Sig Start Date End Date Taking? Authorizing Provider  albuterol (VENTOLIN HFA) 108 (90 Base) MCG/ACT inhaler Inhale 1-2 puffs into the lungs every 6 (six) hours as needed for wheezing (cough). 06/09/21   Libby Maw, MD  allopurinol (ZYLOPRIM) 300 MG tablet TAKE 1 TABLET(300 MG) BY MOUTH DAILY 05/31/19   Elby Beck, FNP  atorvastatin (LIPITOR) 20 MG tablet Take 1 tablet (20 mg total) by mouth daily. 05/29/18   Elby Beck, FNP  benzonatate (TESSALON) 100 MG capsule Take 1 capsule (100 mg total) by mouth 3 (three) times daily as needed for cough. 06/07/21   Apolonio Schneiders, FNP  brimonidine (ALPHAGAN) 0.2 % ophthalmic solution Place 1 drop into both eyes in the morning and at bedtime. 03/19/19   [provider]  carvedilol (COREG) 25 MG tablet TAKE 2 TABLETS(50 MG) BY MOUTH TWICE DAILY 07/11/21   Chandrasekhar, Mahesh A, MD  dorzolamide-timolol (COSOPT) 22.3-6.8 MG/ML ophthalmic solution Place 1 drop into both eyes in the morning and at bedtime. 03/10/19   [provider]  EQ FIBER SUPPLEMENT PO Take 2 tablets by mouth daily.    [provider]  hydrALAZINE (APRESOLINE) 100 MG tablet Take 1 tablet (100 mg total) by mouth 3 (three) times daily. 10/20/20   Nche, Charlene Brooke, NP  isosorbide mononitrate (IMDUR) 60 MG 24 hr tablet Take 1.5 tablets (90 mg total) by mouth daily. 11/09/21   Chandrasekhar, Mahesh A, MD  latanoprost (XALATAN) 0.005 % ophthalmic solution Place 1 drop into the left  eye at bedtime. 04/14/19   [provider]  LINZESS 145 MCG CAPS capsule TAKE 1 CAPSULE(145 MCG) BY MOUTH DAILY BEFORE BREAKFAST. GIVEN** 10/31/21   Nche, Charlene Brooke, NP  olmesartan (BENICAR) 40 MG tablet Take 1 tablet (40 mg total) by mouth daily. 10/31/21   Nche, Charlene Brooke, NP  omeprazole (PRILOSEC) 40 MG capsule Take 1 capsule (40 mg total) by mouth daily. 02/09/20 10/19/21  Mansouraty, Telford Nab., MD  ondansetron (ZOFRAN) 4 MG tablet Take 1 tablet (4 mg total) by mouth every 8 (eight) hours as needed for nausea or vomiting. 05/09/21   Mar Daring, PA-C  Potassium Chloride ER 20 MEQ TBCR Take 20 mEq by mouth daily. 10/17/21   Libby Maw,  MD  prednisoLONE acetate (PRED FORTE) 1 % ophthalmic suspension 3 (three) times daily. 03/10/21   [provider]  predniSONE (DELTASONE) 20 MG tablet Take 2 tablets (40 mg total) by mouth daily with breakfast. 11/14/21   Mar Daring, PA-C  spironolactone (ALDACTONE) 25 MG tablet Take 1 tablet (25 mg total) by mouth daily. 10/31/21   Nche, Charlene Brooke, NP  torsemide (DEMADEX) 20 MG tablet Take 4 tablets (80 mg total) by mouth See admin instructions. Take 80 mg by mouth twice a day- morning and evening 10/17/21 10/12/22  Libby Maw, MD  Vitamin D, Ergocalciferol, (DRISDOL) 1.25 MG (50000 UNIT) CAPS capsule Take 50,000 Units by mouth every Wednesday. 08/12/21   [provider]    Physical Exam: Vitals:   12/07/21 0845 12/07/21 0852 12/07/21 0939 12/07/21 1248  BP:  93/65 (!) 140/82   Pulse: 84  72 70  Resp: (!) 21  20 (!) 21  Temp:   97.9 F (36.6 C) 97.8 F (36.6 C)  TempSrc:   Oral Oral  SpO2: 99%  99% 93%  Weight:      Height:       Exam  Constitutional: Morbid obese middle-age male currently in no acute distress. Eyes: PERRL, lids and conjunctivae normal ENMT: Mucous membranes are moist.   Neck: normal, supple  Respiratory: Decreased overall aeration with no significant  wheezes or rhonchi appreciated. Cardiovascular: Regular rate and rhythm with heart sounds somewhat distant. No extremity edema.  Tenderness palpation of the sternum and right side of the chest wall. Abdomen: no tenderness, no masses palpated. No hepatosplenomegaly. Bowel sounds positive.  Musculoskeletal: no clubbing / cyanosis. No joint deformity upper and lower extremities.   Skin: no rashes, lesions, ulcers.   Neurologic: CN 2-12 grossly intact.   Strength 5/5 in all 4.  Psychiatric: Normal judgment and insight. Alert and oriented x 3. Normal mood.   Data Reviewed:  Reviewed labs, imaging, and pertinent records as noted above in HPI.  Assessment and Plan:  Acute kidney injury superimposed on chronic kidney disease stage V Patient presents with complaints of weakness, fatigue, and nausea.  Labs revealed creatinine elevated up to 11.82 with BUN 116.  His creatinine had been 6.4 on 10/2.  He is still able to make urine.  Patient symptoms thought to likely be associated with uremia with worsening kidney function.  Nephrology consulted IR for tunneled hemodialysis catheter placement. -Admit to a medical telemetry bed -N.p.o. until able to have procedure and advance diet as tolerated following -Continue sodium bicarb -Appreciate IR and nephrology consulted for services,will follow-up for any further recommendations  Chest pain elevated troponin Patient reports having chest discomfort substernally and to the right side.  On physical exam patient with tenderness palpation of the chest wall.  High-sensitivity troponins 24->35.  Symptoms appear to be atypical in nature. -Continue to monitor  Hyperkalemia Resolved.  Initial potassium elevated at 6.6.  Patient has been treated with temporizing measures and Lokelma 10 g p.o.  Repeat checks 5.6 ->4.8. -Check renal function panels daily  Essential hypertension -Nephrology recommends holding blood pressure medications while initiating  dialysis  Combined systolic and diastolic CHF Euvolemic.  Patient does not appear grossly fluid overloaded on physical exam.  Last echocardiogram revealed EF of 35 to 40% with grade 1 diastolic dysfunction when last checked 11/08/2021. -Fluid management with dialysis  Anemia of chronic kidney disease Hemoglobin stable at 11.4 g/dL which appears near patient's baseline.  Patient denied any reports of bleeding. -Continue  to monitor  Hyperlipidemia -Continue atorvastatin  Metabolic acidosis  CO2 was noted to be 18, but anion gap within normal limits.    Irritable bowel syndrome with constipation -Continue Linzess  Morbid obesity BMI 56.26 kg/m -Continue to counsel on need for weight loss.  Patient would benefit from referral to bariatrics  DVT prophylaxis: Heparin Advance Care Planning:   Code Status: Full Code   Consults: Nephrology, IR  Family Communication: Patient's wife updated over the phone  Severity of Illness: The appropriate patient status for this patient is INPATIENT. Inpatient status is judged to be reasonable and necessary in order to provide the required intensity of service to ensure the patient's safety. The patient's presenting symptoms, physical exam findings, and initial radiographic and laboratory data in the context of their chronic comorbidities is felt to place them at high risk for further clinical deterioration. Furthermore, it is not anticipated that the patient will be medically stable for discharge from the hospital within 2 midnights of admission.   * I certify that at the point of admission it is my clinical judgment that the patient will require inpatient hospital care spanning beyond 2 midnights from the point of admission due to high intensity of service, high risk for further deterioration and high frequency of surveillance required.*  Author: Norval Morton, MD 12/07/2021 1:35 PM  For on call review [email or web address].

## 2021-12-07 NOTE — ED Notes (Signed)
Bag of belongings left behind during transfer. Message left on pt's phone.

## 2021-12-07 NOTE — Consult Note (Signed)
ESRD Consult Note  Requesting provider: Wynetta Fines Service requesting consult: Hospitalist Reason for consult: ESRD, provision of dialysis Indication for acute dialysis?: End Stage Renal Disease  Assessment/Recommendations: Frank Coffey is a/an 41 y.o. male with a past medical history notable for K5 admitted with progressive renal failure concerning for ESRD  # ESRD: CKD V w/ AKI and no clear inciting factor but likely progressed to ESRD at this point. Consult IR for [facility] placement and plan for HD tonight after catheter placement if able. Will hold on fistula placement given desire to do PD long term. CLIP initiated.  # Volume/ hypertension: BP has been up and down. Appears euvolemic. Holding BP meds at this time  #Hyperkalemia: 2/2 spiro and K supplements. Improved. CTM. Hold spiro and K supplements  # Anemia of Chronic Kidney Disease: Hemoglobin 11.4. No intervention needed. CTM for now  #Chest pain: unclear cause. Possible mild uremic pericarditis but no rub. Improved now. CTM  #Metabolic Acidosis: likely 2/2 AKI/CKD. Stop IV bicarb. Start oral bicarb '650mg'$  BID  # Additional recommendations: - Dose all meds for creatinine clearance < 10 ml/min  - Unless absolutely necessary, no MRIs with gadolinium.  - Implement save arm precautions.  Prefer needle sticks in the dorsum of the hands or wrists.  No blood pressure measurements in arm. - If blood transfusion is requested during hemodialysis sessions, please alert [place] prior to the session.  - Use synthetic opioids (Fentanyl/Dilaudid) if needed  Recommendations were discussed with the primary team.   History of Present Illness: Frank Coffey is a/an 41 y.o. male with a past medical history of CKD V who presented with chest pain.  This patient follows in our clinic for CKD management.  Baseline creatinine has been around 6.  He has been undergoing workup in the outpatient setting to get her ready for dialysis.  There was possible plan  for peritoneal dialysis and he was getting ready to see vascular surgery for evaluation.  Patient is a little bit hazy on the details but ultimately presented to the emergency department yesterday with symptoms such as fatigue, general weakness, headaches, nausea, poor appetite, chest pain.  He states that over the past few days he has had no appetite and had a difficult time eating.  He did not have significant shortness of breath, fevers, chills.  He did have right-sided chest pain that was worse with movement and laying down.  States he has been compliant with his medications that he was taking at home and was taking some potassium tablets as well as spironolactone.  In the emergency department his potassium was 6.6.  This was treated medically and has improved down to 4.8 today.  The patient also had a creatinine of 11.3.  Some of the symptoms he was exhibiting or felt to be uremic and he was transferred to [facility] for further evaluation.  He states he feels better today but not quite back to himself.  Understands that he will need to start dialysis.   Medications:  Current Facility-Administered Medications  Medication Dose Route Frequency Provider Last Rate Last Admin   albuterol (PROVENTIL) (2.5 MG/3ML) 0.083% nebulizer solution 3 mL  3 mL Inhalation Q6H PRN Wynetta Fines T, MD       allopurinol (ZYLOPRIM) tablet 300 mg  300 mg Oral Daily Wynetta Fines T, MD   300 mg at 12/07/21 1115   atorvastatin (LIPITOR) tablet 20 mg  20 mg Oral Daily Lequita Halt, MD   20 mg  at 12/07/21 1115   benzonatate (TESSALON) capsule 100 mg  100 mg Oral TID PRN Lequita Halt, MD       brimonidine (ALPHAGAN) 0.2 % ophthalmic solution 1 drop  1 drop Both Eyes Q8H Zhang, Pearletha Forge T, MD       dorzolamide-timolol (COSOPT) 2-0.5 % ophthalmic solution 1 drop  1 drop Both Eyes BID Wynetta Fines T, MD   1 drop at 12/07/21 1114   hydrALAZINE (APRESOLINE) injection 5 mg  5 mg Intravenous Q6H PRN Wynetta Fines T, MD       latanoprost  (XALATAN) 0.005 % ophthalmic solution 1 drop  1 drop Left Eye QHS Wynetta Fines T, MD       linaclotide [facility]) capsule 145 mcg  145 mcg Oral QAC breakfast Wynetta Fines T, MD       ondansetron [facility]) tablet 4 mg  4 mg Oral Q8H PRN Wynetta Fines T, MD       pantoprazole (PROTONIX) EC tablet 40 mg  40 mg Oral Daily Wynetta Fines T, MD   40 mg at 12/07/21 1115   prednisoLONE acetate (PRED FORTE) 1 % ophthalmic suspension 1 drop  1 drop Both Eyes TID Wynetta Fines T, MD   1 drop at 12/07/21 1116   sodium bicarbonate 75 mEq in sodium chloride 0.45 % 1,075 mL infusion   Intravenous Continuous Lequita Halt, MD 50 mL/hr at 12/07/21 0444 Infusion Verify at 12/07/21 0444     ALLERGIES Patient has no known allergies.  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)    IBS (irritable bowel syndrome)    Obesity    OSA (obstructive sleep apnea)    Pneumonia 04/2019   with Covid   Sleep apnea      SOCIAL HISTORY Social History   Socioeconomic History   Marital status: Divorced    Spouse name: Not on file   Number of children: Not on file   Years of education: Not on file   Highest education level: Not on file  Occupational History   Not on file  Tobacco Use   Smoking status: Never   Smokeless tobacco: Never  Vaping Use   Vaping Use: Never used  Substance and Sexual Activity   Alcohol use: Not Currently    Comment: occ   Drug use: No   Sexual activity: Not on file  Other Topics Concern   Not on file  Social History Narrative   Epworth Sleepiness  Scale = 10 (as of 12/01/14)   Social Determinants of Health   Financial Resource Strain: Not on file  Food Insecurity: Not on file  Transportation Needs: Not on file  Physical Activity: Not on file  Stress: Not on file  Social Connections: Not on file  Intimate Partner Violence: Not on file     FAMILY HISTORY Family History  Problem Relation Age of Onset   Hypertension Mother    Hyperlipidemia Mother  Heart disease Mother    Kidney disease Mother    Depression Mother    Stomach cancer Father    Healthy Sister        x2   Healthy Brother        x2   Hypertension Maternal Grandmother    Stroke Maternal Grandmother    Heart disease Maternal Grandmother    Hyperlipidemia Maternal Grandmother    Stroke Maternal Grandfather    Hypertension Maternal Grandfather    Heart disease Maternal Grandfather    Hyperlipidemia Maternal Grandfather    Alzheimer's disease Maternal Grandfather    Cancer - Other Paternal Grandmother    Healthy Daughter        x1   Healthy Son        x2   Diabetes Neg Hx    Heart attack Neg Hx    Sudden death Neg Hx    Colon cancer Neg Hx    Esophageal cancer Neg Hx    Pancreatic cancer Neg Hx    Inflammatory bowel disease Neg Hx    Liver disease Neg Hx    Rectal cancer Neg Hx      Review of Systems: 12 systems were reviewed and negative except per HPI  Physical Exam: Vitals:   12/07/21 0852 12/07/21 0939  BP: 93/65 (!) 140/82  Pulse:  72  Resp:  20  Temp:  97.9 F (36.6 C)  SpO2:  99%   Total I/O In: -  Out: 725 [Urine:725]  Intake/Output Summary (Last 24 hours) at 12/07/2021 1204 Last data filed at 12/07/2021 5883 Gross per 24 hour  Intake 1476.66 ml  Output 2175 ml  Net -698.34 ml   General: well-appearing, no acute distress HEENT: anicteric sclera, MMM CV: normal rate, no murmurs, no edema Lungs: bilateral chest rise, normal wob Abd: soft, non-tender, non-distended Skin: no visible lesions or rashes Psych: alert,  engaged, appropriate mood and affect Neuro: normal speech, no gross focal deficits   Test Results Reviewed Lab Results  Component Value Date   NA 133 (L) 12/06/2021   K 4.8 12/06/2021   CL 107 12/06/2021   CO2 18 (L) 12/06/2021   BUN 114 (H) 12/06/2021   CREATININE 11.33 (H) 12/06/2021   GFR 9.86 (LL) 10/17/2021   GLU 85 11/05/2021   CALCIUM 8.5 (L) 12/06/2021   ALBUMIN 3.5 12/06/2021   PHOS 6.7 (H) 12/06/2021    I have reviewed relevant outside healthcare records

## 2021-12-07 NOTE — Progress Notes (Signed)
Patients states he does wear BiPAP at home but he does not want to wear BiPAP now because he is very uncomfortable.  RT will continue to monitor.

## 2021-12-07 NOTE — Progress Notes (Signed)
Patient's SPO2 dropped to 83%.  Upon entering the room the patient was laying no his side snoring and was not wearing his cannula.  Upon waking patient he stated he had removed the cannula to eat.  Placed patient back on Hi-Flo nasal cannula.  RT will continue to monitor.

## 2021-12-07 NOTE — Consult Note (Signed)
Chief Complaint: Renal failure needing dialysis  Referring Physician(s): Santiago Bumpers  Supervising Physician: Ruthann Cancer  Patient Status: [facility] - Out-pt  History of Present Illness: Frank Coffey is a 41 y.o. male with medical issues including HTN, CHF, and known CKD stage 5.  He presented to the ED yesterday complaining of fatigue, general weakness, headaches, nausea, poor appetite, and chest pain.    On arrival his potassium was 6.6 which was treated medically and is now down to 4.8 today.    His creatinine was 11.3.    He has been followed by nephrology for a while and plans for dialysis were underway.  He wanted to do home peritoneal dialysis and was planning to a see a surgeon for evaluation for placement of a PD Catheter.  He is going to need to start hemodialysis sooner than expected.  We are asked to place a tunneled hemodialysis catheter today.  He is NPO.   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)    IBS (irritable bowel syndrome)    Obesity    OSA (obstructive sleep apnea)    Pneumonia 04/2019   with Covid   Sleep apnea     Past Surgical History:  Procedure Laterality Date   BIOPSY  02/09/2020   Procedure: BIOPSY;  Surgeon: Irving Copas., MD;  Location: [facility] ENDOSCOPY;  Service: Gastroenterology;;   CHOLECYSTECTOMY     COLONOSCOPY WITH PROPOFOL N/A 02/09/2020   Procedure: COLONOSCOPY WITH PROPOFOL;  Surgeon: Irving Copas., MD;  Location: [place];  Service: Gastroenterology;  Laterality: N/A;    ESOPHAGOGASTRODUODENOSCOPY (EGD) WITH PROPOFOL N/A 02/09/2020   Procedure: ESOPHAGOGASTRODUODENOSCOPY (EGD) WITH PROPOFOL;  Surgeon: Rush Landmark Telford Nab., MD;  Location: [place];  Service: Gastroenterology;  Laterality: N/A;   POLYPECTOMY  02/09/2020   Procedure: POLYPECTOMY;  Surgeon: Rush Landmark Telford Nab., MD;  Location: [place];  Service: Gastroenterology;;   RETINAL DETACHMENT REPAIR W/ SCLERAL BUCKLE LE     Left    Allergies: Patient has no known allergies.  Medications: Prior to Admission medications   Medication Sig Start Date End Date Taking? Authorizing Provider  albuterol (VENTOLIN HFA) 108 (90 Base) MCG/ACT inhaler Inhale 1-2 puffs into the lungs every 6 (six) hours as needed for wheezing (cough). 06/09/21   Libby Maw, MD  allopurinol (ZYLOPRIM) 300 MG tablet TAKE 1 TABLET(300 MG) BY MOUTH DAILY 05/31/19   Elby Beck, FNP  atorvastatin (LIPITOR) 20 MG tablet Take 1 tablet (20 mg total) by mouth daily. 05/29/18   Elby Beck, FNP  benzonatate (TESSALON) 100 MG capsule Take 1 capsule (100 mg total) by mouth 3 (three) times daily as needed for cough. 06/07/21   Apolonio Schneiders, FNP  brimonidine (ALPHAGAN) 0.2 % ophthalmic solution Place 1 drop into both eyes in the morning and at bedtime. 03/19/19   [provider]  carvedilol (COREG) 25 MG tablet TAKE 2 TABLETS(50 MG) BY MOUTH TWICE DAILY 07/11/21  Chandrasekhar, Mahesh A, MD  dorzolamide-timolol (COSOPT) 22.3-6.8 MG/ML ophthalmic solution Place 1 drop into both eyes in the morning and at bedtime. 03/10/19   [provider]  EQ FIBER SUPPLEMENT PO Take 2 tablets by mouth daily.    [provider]  hydrALAZINE (APRESOLINE) 100 MG tablet Take 1 tablet (100 mg total) by mouth 3 (three) times daily. 10/20/20   Nche, Charlene Brooke, NP  isosorbide mononitrate (IMDUR) 60 MG 24 hr tablet Take 1.5 tablets (90 mg total) by mouth daily. 11/09/21   Chandrasekhar, Mahesh A, MD   latanoprost (XALATAN) 0.005 % ophthalmic solution Place 1 drop into the left eye at bedtime. 04/14/19   [provider]  LINZESS 145 MCG CAPS capsule TAKE 1 CAPSULE(145 MCG) BY MOUTH DAILY BEFORE BREAKFAST. GIVEN** 10/31/21   Nche, Charlene Brooke, NP  olmesartan (BENICAR) 40 MG tablet Take 1 tablet (40 mg total) by mouth daily. 10/31/21   Nche, Charlene Brooke, NP  omeprazole (PRILOSEC) 40 MG capsule Take 1 capsule (40 mg total) by mouth daily. 02/09/20 10/19/21  Mansouraty, Telford Nab., MD  ondansetron (ZOFRAN) 4 MG tablet Take 1 tablet (4 mg total) by mouth every 8 (eight) hours as needed for nausea or vomiting. 05/09/21   Mar Daring, PA-C  Potassium Chloride ER 20 MEQ TBCR Take 20 mEq by mouth daily. 10/17/21   Libby Maw, MD  prednisoLONE acetate (PRED FORTE) 1 % ophthalmic suspension 3 (three) times daily. 03/10/21   [provider]  predniSONE (DELTASONE) 20 MG tablet Take 2 tablets (40 mg total) by mouth daily with breakfast. 11/14/21   Mar Daring, PA-C  spironolactone (ALDACTONE) 25 MG tablet Take 1 tablet (25 mg total) by mouth daily. 10/31/21   Nche, Charlene Brooke, NP  torsemide (DEMADEX) 20 MG tablet Take 4 tablets (80 mg total) by mouth See admin instructions. Take 80 mg by mouth twice a day- morning and evening 10/17/21 10/12/22  Libby Maw, MD  Vitamin D, Ergocalciferol, (DRISDOL) 1.25 MG (50000 UNIT) CAPS capsule Take 50,000 Units by mouth every Wednesday. 08/12/21   [provider]     Family History  Problem Relation Age of Onset   Hypertension Mother    Hyperlipidemia Mother    Heart disease Mother    Kidney disease Mother    Depression Mother    Stomach cancer Father    Healthy Sister        x2   Healthy Brother        x2   Hypertension Maternal Grandmother    Stroke Maternal Grandmother    Heart disease Maternal Grandmother    Hyperlipidemia Maternal Grandmother    Stroke Maternal Grandfather     Hypertension Maternal Grandfather    Heart disease Maternal Grandfather    Hyperlipidemia Maternal Grandfather    Alzheimer's disease Maternal Grandfather    Cancer - Other Paternal Grandmother    Healthy Daughter        x1   Healthy Son        x2   Diabetes Neg Hx    Heart attack Neg Hx    Sudden death Neg Hx    Colon cancer Neg Hx    Esophageal cancer Neg Hx    Pancreatic cancer Neg Hx    Inflammatory bowel disease Neg Hx    Liver disease Neg Hx    Rectal cancer Neg Hx     Social History   Socioeconomic History   Marital status: Divorced  Spouse name: Not on file   Number of children: Not on file   Years of education: Not on file   Highest education level: Not on file  Occupational History   Not on file  Tobacco Use   Smoking status: Never   Smokeless tobacco: Never  Vaping Use   Vaping Use: Never used  Substance and Sexual Activity   Alcohol use: Not Currently    Comment: occ   Drug use: No   Sexual activity: Not on file  Other Topics Concern   Not on file  Social History Narrative   Epworth Sleepiness Scale = 10 (as of 12/01/14)   Social Determinants of Health   Financial Resource Strain: Not on file  Food Insecurity: Not on file  Transportation Needs: Not on file  Physical Activity: Not on file  Stress: Not on file  Social Connections: Not on file     Review of Systems: A 12 point ROS discussed and pertinent positives are indicated in the HPI above.  All other systems are negative.  Review of Systems  Vital Signs: BP (!) 140/82 (BP Location: Left Wrist)   Pulse 72   Temp 97.9 F (36.6 C) (Oral)   Resp 20   Ht '5\' 8"'$  (1.727 m)   Wt (!) 370 lb (167.8 kg)   SpO2 99%   BMI 56.26 kg/m   Physical Exam Vitals reviewed.  Constitutional:      Appearance: Normal appearance.  HENT:     Head: Normocephalic and atraumatic.  Eyes:     Extraocular Movements: Extraocular movements intact.  Cardiovascular:     Rate and Rhythm: Normal rate and  regular rhythm.  Pulmonary:     Effort: Pulmonary effort is normal. No respiratory distress.     Breath sounds: Normal breath sounds.  Abdominal:     General: There is no distension.     Palpations: Abdomen is soft.     Tenderness: There is no abdominal tenderness.  Musculoskeletal:        General: Normal range of motion.     Cervical back: Normal range of motion.  Skin:    General: Skin is warm and dry.  Neurological:     General: No focal deficit present.     Mental Status: He is alert and oriented to person, place, and time.  Psychiatric:        Mood and Affect: Mood normal.        Behavior: Behavior normal.        Thought Content: Thought content normal.        Judgment: Judgment normal.     Imaging: US Renal  Result Date: 12/06/2021 CLINICAL DATA:  Renal failure. EXAM: RENAL / URINARY TRACT ULTRASOUND COMPLETE COMPARISON:  Abdominal ultrasound dated July 03, 2014. FINDINGS: Right Kidney: Renal measurements: 10.0 x 4.5 x 4.3 cm = volume: 102 mL. Progressive increased echogenicity. No mass or hydronephrosis visualized. Left Kidney: Renal measurements: 10.5 x 5.1 x 5.3 cm = volume: 149 mL. Progressive increased echogenicity. No mass or hydronephrosis visualized. Bladder: Appears normal for degree of bladder distention. Other: None. IMPRESSION: 1. No acute abnormality. 2. Progressive increased echogenicity of the bilateral kidneys, consistent with medical renal disease. Electronically Signed   By: Titus Dubin M.D.   On: 12/06/2021 18:56   DG Chest 2 View  Result Date: 12/06/2021 CLINICAL DATA:  Chest pain. EXAM: CHEST - 2 VIEW COMPARISON:  August 10, 2020. FINDINGS: The heart size and mediastinal contours are within normal limits.  Both lungs are clear. The visualized skeletal structures are unremarkable. IMPRESSION: No active cardiopulmonary disease. Electronically Signed   By: Marijo Conception M.D.   On: 12/06/2021 12:23   ECHOCARDIOGRAM COMPLETE  Result Date: 11/08/2021     ECHOCARDIOGRAM REPORT   Patient Name:   Frank Coffey Date of Exam: 11/08/2021 Medical Rec #:  [id number]      Height:       68.5 in Accession #:    [id number]     Weight:       377.0 lb Date of Birth:  [birth year]       BSA:          2.690 m Patient Age:    44 years       BP:           140/68 mmHg Patient Gender: M              HR:           67 bpm. Exam Location:  [place] Procedure: 2D Echo, Cardiac Doppler, Color Doppler and Intracardiac            Opacification Agent Indications:    I50.22 CHF  History:        Patient has prior history of Echocardiogram examinations, most                 recent 02/18/2020. Cardiomyopathy and CHF, TIA, Arrythmias:1st                 degree AV block; Risk Factors:Hypertension, Morbid obesity,                 Dyslipidemia and Sleep Apnea. COVID 04/2019.  Sonographer:    Basilia Jumbo [facility], RDCS Referring Phys: [id number] [facility]  Sonographer Comments: Technically difficult study due to poor echo windows and suboptimal apical window. Image acquisition challenging due to patient body habitus. IMPRESSIONS  1. Left ventricular ejection fraction, by estimation, is 35 to 40%. The left ventricle has moderately decreased function. The left ventricle demonstrates global hypokinesis. Left ventricular diastolic parameters are consistent with Grade I diastolic dysfunction (impaired relaxation).  2. Right ventricular systolic function is normal. The right ventricular size is normal.  3. The mitral valve is normal in structure. No evidence of mitral valve regurgitation. No evidence of mitral stenosis.  4. The aortic valve is normal in structure. Aortic valve regurgitation is not visualized. No aortic stenosis is present.  5. Aortic dilatation noted. There is mild dilatation of the ascending aorta, measuring 43 mm.  6. The inferior vena cava is dilated in size with >50% respiratory variability, suggesting right atrial pressure of 8 mmHg. Comparison(s): Prior images reviewed side by side.  02/18/20 EF 35%. FINDINGS  Left Ventricle: Left ventricular ejection fraction, by estimation, is 35 to 40%. The left ventricle has moderately decreased function. The left ventricle demonstrates global hypokinesis. Definity contrast agent was given IV to delineate the left ventricular endocardial borders. The left ventricular internal cavity size was normal in size. There is no left ventricular hypertrophy. Left ventricular diastolic parameters are consistent with Grade I diastolic dysfunction (impaired relaxation).  LV Wall Scoring: The posterior wall and mid inferior segment are akinetic. Right Ventricle: The right ventricular size is normal. No increase in right ventricular wall thickness. Right ventricular systolic function is normal. Left Atrium: Left atrial size was normal in size. Right Atrium: Right atrial size was normal in size. Pericardium: There is no evidence of pericardial effusion. Mitral  Valve: The mitral valve is normal in structure. No evidence of mitral valve regurgitation. No evidence of mitral valve stenosis. Tricuspid Valve: The tricuspid valve is normal in structure. Tricuspid valve regurgitation is not demonstrated. No evidence of tricuspid stenosis. Aortic Valve: The aortic valve is normal in structure. Aortic valve regurgitation is not visualized. No aortic stenosis is present. Pulmonic Valve: The pulmonic valve was normal in structure. Pulmonic valve regurgitation is not visualized. No evidence of pulmonic stenosis. Aorta: Aortic dilatation noted. There is mild dilatation of the ascending aorta, measuring 43 mm. Venous: The inferior vena cava is dilated in size with greater than 50% respiratory variability, suggesting right atrial pressure of 8 mmHg. IAS/Shunts: No atrial level shunt detected by color flow Doppler.  LEFT VENTRICLE PLAX 2D LVIDd:         6.90 cm   Diastology LVIDs:         5.00 cm   LV e' medial:    5.70 cm/s LV PW:         1.30 cm   LV E/e' medial:  13.6 LV IVS:        1.30  cm   LV e' lateral:   4.19 cm/s LVOT diam:     3.20 cm   LV E/e' lateral: 18.5 LV SV:         160 LV SV Index:   59 LVOT Area:     8.04 cm  RIGHT VENTRICLE             IVC RV Basal diam:  3.90 cm     IVC diam: 2.10 cm RV S prime:     12.60 cm/s TAPSE (M-mode): 3.0 cm LEFT ATRIUM              Index        RIGHT ATRIUM           Index LA diam:        6.00 cm  2.23 cm/m   RA Pressure: 8.00 mmHg LA Vol (A2C):   127.0 ml 47.21 ml/m  RA Area:     22.50 cm LA Vol (A4C):   80.0 ml  29.74 ml/m  RA Volume:   69.40 ml  25.80 ml/m LA Biplane Vol: 105.0 ml 39.03 ml/m  AORTIC VALVE LVOT Vmax:   106.00 cm/s LVOT Vmean:  67.600 cm/s LVOT VTI:    0.199 m  AORTA Ao Root diam: 4.10 cm Ao Asc diam:  4.30 cm MITRAL VALVE               TRICUSPID VALVE                            Estimated RAP:  8.00 mmHg MV Decel Time: 236 msec MV E velocity: 77.60 cm/s  SHUNTS MV A velocity: 80.70 cm/s  Systemic VTI:  0.20 m MV E/A ratio:  0.96        Systemic Diam: 3.20 cm Candee Furbish MD Electronically signed by Candee Furbish MD Signature Date/Time: 11/08/2021/12:56:11 PM    Final     Labs:  CBC: Recent Labs    07/12/21 1207 09/30/21 1524 11/05/21 0000 12/06/21 1205  WBC 10.3 9.2  --  8.9  HGB 12.4* 12.5* 11.6* 11.4*  HCT 41.6 42.6  --  38.0*  PLT 278 272  --  220    COAGS: No results for input(s): "INR", "APTT" in the last 8760 hours.  BMP: Recent Labs  09/30/21 1524 10/17/21 1129 11/05/21 0000 12/06/21 1205 12/06/21 1635 12/06/21 2203  NA 143 139 140 131* 133*  --   K 3.1* 3.4* 3.7 6.6* 5.6* 4.8  CL 106 101 99 105 107  --   CO2 28 27  --  19* 18*  --   GLUCOSE 91 77  --  102* 84  --   BUN 55* 74*  --  116* 114*  --   CALCIUM 9.2 8.6 8.8 8.6* 8.5*  --   CREATININE 6.73* 6.43* 6.3* 11.82* 11.33*  --   GFRNONAA 10*  --   --  5* 5*  --     LIVER FUNCTION TESTS: Recent Labs    04/13/21 0000 07/12/21 1207 12/06/21 1205  BILITOT  --  0.4 0.7  AST  --  24 25  ALT  --  25 31  ALKPHOS  --  108 72  PROT   --  7.2 8.7*  ALBUMIN 3.8 4.0 3.5    TUMOR MARKERS: No results for input(s): "AFPTM", "CEA", "CA199", "CHROMGRNA" in the last 8760 hours.  Assessment and Plan:  Renal failure now requiring hemodialysis.   Will proceed with image guided placement of a tunneled hemodialysis catheter today by Dr. Forde Dandy. Pascal Lux.  Risks and benefits discussed with the patient including, but not limited to bleeding, infection, vascular injury, pneumothorax which may require chest tube placement, air embolism or even death  All of the patient's questions were answered, patient is agreeable to proceed. Consent signed and in chart.  Thank you for allowing our service to participate in Frank Coffey 's care.  Electronically Signed: Murrell Redden, PA-C   12/07/2021, 12:30 PM      I spent a total of 20 Minutes  in face to face in clinical consultation, greater than 50% of which was counseling/coordinating care for tunneled HD cath placement.

## 2021-12-07 NOTE — ED Notes (Signed)
Report given to [facility]. 

## 2021-12-07 NOTE — Procedures (Signed)
Interventional Radiology Procedure Note  Procedure: Tunneled hemodialysis catheter placement  Findings: Please refer to procedural dictation for full description. R IJ 23 cm tunneled HD.  Complications:  None immediate  Estimated Blood Loss: < 5 ml  Recommendations: Catheter ready for immediate use.   Ruthann Cancer, MD

## 2021-12-07 NOTE — ED Notes (Signed)
[facility] at bedside 

## 2021-12-08 LAB — CBC
HCT: 34.9 % — ABNORMAL LOW (ref 39.0–52.0)
Hemoglobin: 11 g/dL — ABNORMAL LOW (ref 13.0–17.0)
MCH: 24.4 pg — ABNORMAL LOW (ref 26.0–34.0)
MCHC: 31.5 g/dL (ref 30.0–36.0)
MCV: 77.6 fL — ABNORMAL LOW (ref 80.0–100.0)
Platelets: 192 10*3/uL (ref 150–400)
RBC: 4.5 MIL/uL (ref 4.22–5.81)
RDW: 18.1 % — ABNORMAL HIGH (ref 11.5–15.5)
WBC: 7.9 10*3/uL (ref 4.0–10.5)
nRBC: 0 % (ref 0.0–0.2)

## 2021-12-08 LAB — RENAL FUNCTION PANEL
Albumin: 3.2 g/dL — ABNORMAL LOW (ref 3.5–5.0)
Anion gap: 10 (ref 5–15)
BUN: 82 mg/dL — ABNORMAL HIGH (ref 6–20)
CO2: 22 mmol/L (ref 22–32)
Calcium: 8.5 mg/dL — ABNORMAL LOW (ref 8.9–10.3)
Chloride: 104 mmol/L (ref 98–111)
Creatinine, Ser: 8.94 mg/dL — ABNORMAL HIGH (ref 0.61–1.24)
GFR, Estimated: 7 mL/min — ABNORMAL LOW (ref >60)
Glucose, Bld: 98 mg/dL (ref 70–99)
Phosphorus: 6.6 mg/dL — ABNORMAL HIGH (ref 2.5–4.6)
Potassium: 4.9 mmol/L (ref 3.5–5.1)
Sodium: 136 mmol/L (ref 135–145)

## 2021-12-08 LAB — HIV ANTIBODY (ROUTINE TESTING W REFLEX): HIV Screen 4th Generation wRfx: NONREACTIVE

## 2021-12-08 MED ORDER — OXYCODONE HCL 5 MG PO TABS
5.0000 mg | ORAL_TABLET | Freq: Four times a day (QID) | ORAL | Status: AC | PRN
  Administered 2021-12-08 (×2): 5 mg via ORAL
  Filled 2021-12-08 (×2): qty 1

## 2021-12-08 MED ORDER — SODIUM CHLORIDE 0.9 % IV SOLN: 6.2500 mg | Freq: Four times a day (QID) | INTRAVENOUS | Status: DC | PRN

## 2021-12-08 MED ORDER — PROMETHAZINE HCL 25 MG PO TABS
25.0000 mg | ORAL_TABLET | Freq: Four times a day (QID) | ORAL | Status: DC | PRN
  Administered 2021-12-08: 25 mg via ORAL
  Filled 2021-12-08 (×2): qty 1

## 2021-12-08 MED ORDER — PROMETHAZINE HCL 25 MG RE SUPP: 25.0000 mg | Freq: Four times a day (QID) | RECTAL | Status: DC | PRN

## 2021-12-08 MED ORDER — ALLOPURINOL 100 MG PO TABS
100.0000 mg | ORAL_TABLET | ORAL | Status: DC
  Administered 2021-12-09: 100 mg via ORAL
  Filled 2021-12-08: qty 1

## 2021-12-08 NOTE — Progress Notes (Addendum)
PROGRESS NOTE    FADI MENTER  PJK:[id number] DOB: 1980-05-13 DOA: 12/06/2021 PCP: Flossie Buffy, NP  Chief Complaint  Patient presents with   Chest Pain    Brief Narrative:  KA FLAMMER is Anasha Perfecto 41 y.o. 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  Patient reports that he has been sleeping more than normal.  Associated symptoms included nausea, substernal/right-sided chest discomfort, bilateral leg pains, and constipation.  Denies having any recent fall/trauma, vomiting, cough, shortness of breath, or significant leg swelling.  He reports that he has been able to make urine.  He is followed by Dr. Jimmy Footman of [facility], but states that he has been switched over to Dr. Posey Pronto after he left.  Ultimately he would like to be on peritoneal dialysis.    In the emergency department patient was noted to be afebrile with tachypnea, and hypoxic down to 88% placed on high flow nasal cannula oxygen.  Initial labs yesterday revealed potassium 6.6, CO2 19, BUN 116, creatinine 11.82, and high-sensitivity troponin 24->35.  Patient has been given calcium gluconate, 5 units of insulin, dextrose,  and Lokelma 10 g p.o.  Repeat potassium had been noted to be 5.6->4.8. Nephrology consulted and placed orders for IR for tunneled hemodialysis catheter   Assessment & Plan:   Principal Problem:   Hyperkalemia Active Problems:   Acute kidney injury superimposed on chronic kidney disease (HCC)   Elevated troponin   Chest pain   Essential hypertension   Combined systolic and diastolic cardiac dysfunction   Anemia due to chronic kidney disease   Hyperlipidemia   Irritable bowel syndrome with constipation   Morbid obesity with BMI of 50.0-59.9, adult ([place])  Acute kidney injury superimposed on chronic  kidney disease stage V Patient presented with complaints of weakness, fatigue, and nausea.  Labs revealed creatinine elevated up to 11.82 with BUN 116.  His creatinine had been 6.4 on 10/2.  He is still able to make urine.  Patient symptoms thought to likely be associated with uremia with worsening kidney function.  Nephrology consulted IR for tunneled hemodialysis catheter placement. -s/p TDC by IR on 11/23 -HD per renal.  Apparently plans for PD outpatient?  CLIP in progress. -Continue sodium bicarb   OHS  OSA  Oxygen Requirement Requiring significant O2, CXR without active cardiopulm disease Suspect this is mostly related to OSA/OHS as he doesn't have any significant requirement at rest, but when he falls asleep, needs significant O2 He uses bipap at home, has been refusing this here Encouraged him to bring home bipap if he'll wear this  Nausea  Vomiting - occurred once today, asymptomatic now - will follow   Chest pain  Mildly Elevated troponins Patient reports having chest discomfort substernally and to the right side.  On physical exam patient with tenderness palpation of the chest wall.  High-sensitivity troponins 24->35.  Symptoms appear to be atypical in nature. -Continue to monitor -resolved today, follow   Hyperkalemia Improved after dialysis   Essential hypertension -Nephrology recommends holding blood pressure medications while initiating dialysis   Combined systolic and diastolic CHF Euvolemic.  Patient does not appear grossly fluid overloaded on physical exam.  Last echocardiogram revealed EF of 35 to 40% with grade 1 diastolic dysfunction when last checked 11/08/2021. -Fluid management with dialysis   Anemia of  chronic kidney disease Hemoglobin stable at 11.4 g/dL which appears near patient's baseline.  Patient denied any reports of bleeding. -Continue to monitor, likely related to renal disease   Hyperlipidemia -Continue atorvastatin   Metabolic acidosis   Due to renal disease   Irritable bowel syndrome with constipation -Continue Linzess   Morbid obesity BMI 56.26 kg/m -Continue to counsel on need for weight loss.  Patient would benefit from referral to bariatrics    DVT prophylaxis: heparin Code Status: full Family Communication: none at bedside Disposition:   Status is: Inpatient Remains inpatient appropriate because: continued need for dialysis   Consultants:  IR nephrology  Procedures:  IMPRESSION: Successful placement of 23 cm tip to cuff tunneled hemodialysis catheter via the right internal jugular vein with catheter tip terminating within the right atrium. The catheter is ready for immediate use.     Antimicrobials:  Anti-infectives (From admission, onward)    Start     Dose/Rate Route Frequency Ordered Stop   12/07/21 1652  ceFAZolin (ANCEF) 2-4 GM/100ML-% IVPB       Note to Pharmacy: Ricky Stabs C: cabinet override      12/07/21 1652 12/07/21 1725   12/07/21 1650  ceFAZolin (ANCEF) IVPB 2g/100 mL premix        over 30 Minutes Intravenous Continuous PRN 12/07/21 1704 12/07/21 1650       Subjective: Denies any abdominal pain or discomfort  Objective: Vitals:   12/08/21 0455 12/08/21 0747 12/08/21 0800 12/08/21 1100  BP: (!) 105/55 129/73  106/66  Pulse: 70 60  (!) 59  Resp: 16   15  Temp: 97.8 F (36.6 C) 97.6 F (36.4 C)  98.3 F (36.8 C)  TempSrc: Oral Oral  Axillary  SpO2: 100% 96% 95% 96%  Weight: (!) 164.6 kg     Height:        Intake/Output Summary (Last 24 hours) at 12/08/2021 1535 Last data filed at 12/07/2021 2345 Gross per 24 hour  Intake --  Output 0 ml  Net 0 ml   Filed Weights   12/07/21 2135 12/07/21 2345 12/08/21 0455  Weight: (!) 163.2 kg (!) 163.2 kg (!) 164.6 kg    Examination:  General exam: Appears calm and comfortable  Respiratory system: unlabored Cardiovascular system: RRR Gastrointestinal system: Abdomen is nondistended, soft and nontender.   Central nervous system: Alert and oriented, but sleepy.  No asterixis. No focal neurological deficits. Extremities: no LEE   Data Reviewed: I have personally reviewed following labs and imaging studies  CBC: Recent Labs  Lab 12/06/21 1205 12/08/21 0722  WBC 8.9 7.9  HGB 11.4* 11.0*  HCT 38.0* 34.9*  MCV 78.7* 77.6*  PLT 220 448    Basic Metabolic Panel: Recent Labs  Lab 12/06/21 1205 12/06/21 1354 12/06/21 1635 12/06/21 2203 12/07/21 0746 12/08/21 0722  NA 131*  --  133*  --  134* 136  K 6.6*  --  5.6* 4.8 5.3* 4.9  CL 105  --  107  --  107 104  CO2 19*  --  18*  --  20* 22  GLUCOSE 102*  --  84  --  115* 98  BUN 116*  --  114*  --  118* 82*  CREATININE 11.82*  --  11.33*  --  11.18* 8.94*  CALCIUM 8.6*  --  8.5*  --  8.6* 8.5*  MG  --  2.3  --   --   --   --   PHOS  --  6.7*  --   --   -- **Note De-Identified vi Obfusction** 6.6*    GFR: Estimted Cretinine Clernce: 16.1 mL/min () (by [employer] formul bsed on SCr of 8.94 mg/dL (H)).  Liver Function Tests: Recent Lbs  Lb 12/06/21 1205 12/08/21 0722  ST 25  --   LT 31  --   LKPHOS 72  --   BILITOT 0.7  --   PROT 8.7*  --   LBUMIN 3.5 3.2*    CBG: Recent Lbs  Lb 12/06/21 1446 12/06/21 1537 12/07/21 2058  GLUCP 87 138* 178*     No results found for this or ny previous visit (from the pst 240 hour(s)).       Rdiology Studies: IR Fluoro Guide CV Line Right  Result Dte: 12/07/2021 INDICTION: 64 yer old mle with end-stge renl disese requiring initition of hemodilysis presenting for centrl venous ctheter. EXM: TUNNELED CENTRL VENOUS HEMODILYSIS CTHETER PLCEMENT WITH ULTRSOUND ND FLUOROSCOPIC GUIDNCE MEDICTIONS: ncef 2 gm IV . The ntibiotic ws given in n pproprite time intervl prior to skin puncture. NESTHESI/SEDTION: Moderte (conscious) sedtion ws employed during this procedure.  totl of Versed 0 mg nd Fentnyl 100 mcg ws dministered intrvenously. Moderte Sedtion Time: 18 minutes.  The ptient's level of consciousness nd vitl signs were monitored continuously by rdiology nursing throughout the procedure under my direct supervision. FLUOROSCOPY TIME:  37.9 mGy COMPLICTIONS: None immedite. PROCEDURE: Informed written consent ws obtined from the ptient fter  discussion of the risks, benefits, nd lterntives to tretment. Questions regrding the procedure were encourged nd nswered. The right neck nd chest were prepped with chlorhexidine in  sterile fshion, nd  sterile drpe ws pplied covering the opertive field. Mximum brrier sterile technique with sterile gowns nd gloves were used for the procedure.  timeout ws performed prior to the initition of the procedure. fter creting  smll venotomy incision,  21 guge micropuncture kit ws utilized to ccess the internl jugulr vein. Rel-time ultrsound guidnce ws utilized for vsculr ccess including the cquisition of  permnent ultrsound imge documenting ptency of the ccessed vessel.  Rosen wire ws dvnced to the level of the IVC nd the micropuncture sheth ws exchnged for n 8 Fr diltor.  14.5 French tunneled hemodilysis ctheter mesuring 23 cm from tip to cuff ws tunneled in  retrogrde fshion from the nterior chest wll to the venotomy incision. Seril diltion ws then performed n  peel-wy sheth ws plced. The ctheter ws then plced through the peel-wy sheth with the ctheter tip ultimtely positioned within the right trium. Finl ctheter positioning ws confirmed nd documented with  spot rdiogrphic imge. The ctheter spirtes nd flushes normlly. The ctheter ws flushed with pproprite volume heprin dwells. The ctheter exit site ws secured with  0-Silk retention suture. The venotomy incision ws closed with Dermbond. Sterile dressings were pplied. The ptient tolerted the procedure well without immedite post procedurl compliction. IMPRESSION: Successful  plcement of 23 cm tip to cuff tunneled hemodilysis ctheter vi the right internl jugulr vein with ctheter tip terminting within the right trium. The ctheter is redy for immedite use. Ruthnn Cncer, MD Vsculr nd Interventionl Rdiology Specilists [facility] Rdiology Electroniclly Signed   By: Ruthnn Cncer M.D.   On: 12/07/2021 19:52   IR [place] Guide Vsc ccess Right  Result Dte: 12/07/2021 INDICTION: 57 yer old mle with end-stge renl disese requiring initition of hemodilysis presenting for centrl venous ctheter. EXM: TUNNELED CENTRL VENOUS HEMODILYSIS CTHETER PLCEMENT WITH ULTRSOUND ND FLUOROSCOPIC GUIDNCE MEDICTIONS: ncef 2 gm IV . The ntibiotic ws given in n pproprite time intervl prior to skin puncture.  ANESTHESIA/SEDATION: Moderate (conscious) sedation was employed during this procedure. Samiya Mervin total of Versed 0 mg and Fentanyl 100 mcg was administered intravenously. Moderate Sedation Time: 18 minutes. The patient's level of consciousness and vital signs were monitored continuously by radiology nursing throughout the procedure under my direct supervision. FLUOROSCOPY TIME:  47.0 mGy COMPLICATIONS: None immediate. PROCEDURE: Informed written consent was obtained from the patient after Annai Heick discussion of the risks, benefits, and alternatives to treatment. Questions regarding the procedure were encouraged and answered. The right neck and chest were prepped with chlorhexidine in Kristen Fromm sterile fashion, and Nicosha Struve sterile drape was applied covering the operative field. Maximum barrier sterile technique with sterile gowns and gloves were used for the procedure. Messiah Rovira timeout was performed prior to the initiation of the procedure. After creating Wallis Spizzirri small venotomy incision, Stokely Jeancharles 21 gauge micropuncture kit was utilized to access the internal jugular vein. Real-time ultrasound guidance was utilized for vascular access including the acquisition of Kennard Fildes permanent ultrasound image documenting patency of the  accessed vessel. Regnia Mathwig Rosen wire was advanced to the level of the IVC and the micropuncture sheath was exchanged for an 8 Fr dilator. Celestina Gironda 14.5 French tunneled hemodialysis catheter measuring 23 cm from tip to cuff was tunneled in Keylen Uzelac retrograde fashion from the anterior chest wall to the venotomy incision. Serial dilation was then performed an Arleatha Philipps peel-away sheath was placed. The catheter was then placed through the peel-away sheath with the catheter tip ultimately positioned within the right atrium. Final catheter positioning was confirmed and documented with Brandye Inthavong spot radiographic image. The catheter aspirates and flushes normally. The catheter was flushed with appropriate volume heparin dwells. The catheter exit site was secured with Malai Lady 0-Silk retention suture. The venotomy incision was closed with Dermabond. Sterile dressings were applied. The patient tolerated the procedure well without immediate post procedural complication. IMPRESSION: Successful placement of 23 cm tip to cuff tunneled hemodialysis catheter via the right internal jugular vein with catheter tip terminating within the right atrium. The catheter is ready for immediate use. Ruthann Cancer, MD Vascular and Interventional Radiology Specialists [facility] Radiology Electronically Signed   By: Ruthann Cancer M.D.   On: 12/07/2021 19:52   US Renal  Result Date: 12/06/2021 CLINICAL DATA:  Renal failure. EXAM: RENAL / URINARY TRACT ULTRASOUND COMPLETE COMPARISON:  Abdominal ultrasound dated July 03, 2014. FINDINGS: Right Kidney: Renal measurements: 10.0 x 4.5 x 4.3 cm = volume: 102 mL. Progressive increased echogenicity. No mass or hydronephrosis visualized. Left Kidney: Renal measurements: 10.5 x 5.1 x 5.3 cm = volume: 149 mL. Progressive increased echogenicity. No mass or hydronephrosis visualized. Bladder: Appears normal for degree of bladder distention. Other: None. IMPRESSION: 1. No acute abnormality. 2. Progressive increased echogenicity of the bilateral  kidneys, consistent with medical renal disease. Electronically Signed   By: Titus Dubin M.D.   On: 12/06/2021 18:56        Scheduled Meds:  [START ON 12/09/2021] allopurinol  100 mg Oral Q M,W,F-HD   atorvastatin  20 mg Oral Daily   brimonidine  1 drop Both Eyes Q8H   Chlorhexidine Gluconate Cloth  6 each Topical Q0600   dorzolamide-timolol  1 drop Both Eyes BID   heparin  5,000 Units Subcutaneous Q8H   latanoprost  1 drop Left Eye QHS   linaclotide  145 mcg Oral QAC breakfast   pantoprazole  40 mg Oral Daily   prednisoLONE acetate  1 drop Both Eyes TID   sodium chloride flush  3 mL Intravenous Q12H   Continuous  Infusions:   LOS: 1 day    Time spent: over 30 min    Fayrene Helper, MD [facility]   To contact the attending provider between 7A-7P or the covering provider during after hours 7P-7A, please log into the web site [email or web address] and access using universal  password for that web site. If you do not have the password, please call the hospital operator.  12/08/2021, 3:35 PM

## 2021-12-08 NOTE — Progress Notes (Signed)
Nephrology Follow-Up Consult note   Assessment/Recommendations: Frank Coffey is a/an 41 y.o. male with a past medical history significant for CKD V, admitted for advancement to CKD.      # ESRD: CKD V w/ AKI and no clear inciting factor but considered to have progressed to ESRD at this point -IR placed TDC on 11/23. Appreciate help -First HD on 11/23; next HD tomorrow -Planning to do PD? Will confirm with outpatient providers. Hold on AVF creation for now -CLIP in process   # Volume/ hypertension: BP has been up and down. Appears euvolemic to slightly volume up. Holding BP meds at this time   #Hyperkalemia: 2/2 spiro and K supplements. Improved. CTM. Hold spiro and K supplements   # Anemia of Chronic Kidney Disease: Hemoglobin 11.[name] No intervention needed. CTM for now   #Chest pain: now related to catheter. CTM   #Metabolic Acidosis: likely 2/2 AKI/CKD. Stop oral bicarb. Improved   # Additional recommendations: - Dose all meds for creatinine clearance < 10 ml/min  - Unless absolutely necessary, no MRIs with gadolinium.  - Implement save arm precautions.  Prefer needle sticks in the dorsum of the hands or wrists.  No blood pressure measurements in arm. - If blood transfusion is requested during hemodialysis sessions, please alert [place] prior to the session.  - Use synthetic opioids (Fentanyl/Dilaudid) if needed   Recommendations were discussed with the primary team.   Recommendations conveyed to primary service.    [facility] 12/08/2021 9:31 AM  ___________________________________________________________  CC: esrd  Interval History/Subjective: Patient states that dialysis felt weird but otherwise was okay.  Having some chest pain associated with this dialysis catheter.  No other issues.   Medications:  Current Facility-Administered Medications  Medication Dose Route Frequency Provider Last Rate Last Admin   acetaminophen (TYLENOL) tablet  650 mg  650 mg Oral Q6H PRN Fuller Plan A, MD   650 mg at 12/08/21 0144   Or   acetaminophen (TYLENOL) suppository 650 mg  650 mg Rectal Q6H PRN Fuller Plan A, MD       albuterol (PROVENTIL) (2.5 MG/3ML) 0.083% nebulizer solution 3 mL  3 mL Inhalation Q6H PRN Wynetta Fines T, MD       allopurinol (ZYLOPRIM) tablet 300 mg  300 mg Oral Daily Wynetta Fines T, MD   300 mg at 12/08/21 0819   atorvastatin (LIPITOR) tablet 20 mg  20 mg Oral Daily Wynetta Fines T, MD   20 mg at 12/08/21 0819   benzonatate (TESSALON) capsule 100 mg  100 mg Oral TID PRN Lequita Halt, MD       brimonidine (ALPHAGAN) 0.2 % ophthalmic solution 1 drop  1 drop Both Eyes Q8H Wynetta Fines T, MD   1 drop at 12/08/21 0546   Chlorhexidine Gluconate Cloth 2 % PADS 6 each  6 each Topical Q0600 Reesa Chew, MD   6 each at 12/08/21 0536   dorzolamide-timolol (COSOPT) 2-0.5 % ophthalmic solution 1 drop  1 drop Both Eyes BID Wynetta Fines T, MD   1 drop at 12/08/21 0821   heparin injection 5,000 Units  5,000 Units Subcutaneous Q8H Fuller Plan A, MD   5,000 Units at 12/08/21 0540   hydrALAZINE (APRESOLINE) injection 5 mg  5 mg Intravenous Q6H PRN Wynetta Fines T, MD       latanoprost (XALATAN) 0.005 % ophthalmic solution 1 drop  1 drop Left Eye QHS Lequita Halt, MD   1 drop at  12/08/21 0022   linaclotide (LINZESS) capsule 145 mcg  145 mcg Oral QAC breakfast Wynetta Fines T, MD   145 mcg at 12/08/21 0818   ondansetron (ZOFRAN) tablet 4 mg  4 mg Oral Q8H PRN Lequita Halt, MD       pantoprazole (PROTONIX) EC tablet 40 mg  40 mg Oral Daily Wynetta Fines T, MD   40 mg at 12/08/21 0818   prednisoLONE acetate (PRED FORTE) 1 % ophthalmic suspension 1 drop  1 drop Both Eyes TID Wynetta Fines T, MD   1 drop at 12/08/21 0820   sodium bicarbonate tablet 650 mg  650 mg Oral BID Reesa Chew, MD   650 mg at 12/08/21 0818   sodium chloride flush (NS) 0.9 % injection 3 mL  3 mL Intravenous Q12H Fuller Plan A, MD   3 mL at 12/08/21 0820       Review of Systems: 10 systems reviewed and negative except per interval history/subjective  Physical Exam: Vitals:   12/08/21 0747 12/08/21 0800  BP: 129/73   Pulse: 60   Resp:    Temp: 97.6 F (36.4 C)   SpO2: 96% 95%   No intake/output data recorded.  Intake/Output Summary (Last 24 hours) at 12/08/2021 0931 Last data filed at 12/07/2021 2345 Gross per 24 hour  Intake --  Output 0 ml  Net 0 ml   Constitutional: well-appearing, no acute distress, obese ENMT: ears and nose without scars or lesions, MMM CV: normal rate, trace edema in ankles Respiratory: clear to auscultation, normal work of breathing Gastrointestinal: soft, non-tender, no palpable masses or hernias Skin: no visible lesions or rashes Psych: alert, judgement/insight appropriate, appropriate mood and affect   Test Results I personally reviewed new and old clinical labs and radiology tests Lab Results  Component Value Date   NA 136 12/08/2021   K 4.9 12/08/2021   CL 104 12/08/2021   CO2 22 12/08/2021   BUN 82 (H) 12/08/2021   CREATININE 8.94 (H) 12/08/2021   GFR 9.86 (LL) 10/17/2021   GLU 85 11/05/2021   CALCIUM 8.5 (L) 12/08/2021   ALBUMIN 3.2 (L) 12/08/2021   PHOS 6.6 (H) 12/08/2021    CBC Recent Labs  Lab 12/06/21 1205 12/08/21 0722  WBC 8.9 7.9  HGB 11.4* 11.0*  HCT 38.0* 34.9*  MCV 78.7* 77.6*  PLT 220 192

## 2021-12-08 NOTE — Progress Notes (Signed)
Patient complaints of pain in R chest and neck area post- HD cath. Given PRN tylenol which helped some, but still rated pain 6 out of 10 going to dialysis at beginning of shift. Returned in greater pain. Paged physician; new order for PRN oxycodone given to patient. Helped patient sleep. Patient also states "I just dont do well with pain. Its just a bit sore." Lab tech unable to get AM labs due to patient pain.   Patient refused bipap for sleep. Desats to 62s but would return to 90s. Willing to wear high flow nasal cannula. 10L oxygen applied. Some desats still occurring.

## 2021-12-08 NOTE — Progress Notes (Signed)
   12/07/21 2345  Vitals  Temp 98.5 F (36.9 C)  Temp Source Oral  BP (!) 141/103  MAP (mmHg) 119  BP Location Right Arm  BP Method Automatic  Patient Position (if appropriate) Lying  Pulse Rate 61  ECG Heart Rate 61  Resp 18  Post Treatment  Duration of HD Treatment -hour(s) 2 hour(s)  Liters Processed 37.5  Fluid Removed (mL) 0 mL  Tolerated HD Treatment Yes   1st TX fin. W/o difficulty.

## 2021-12-08 NOTE — Progress Notes (Signed)
Pt c/o nausea and emesis. Vomited his breakfast 5 minutes after intake. VS wnL and as per flow. No c/o pain. Zofran PO given as per [facility] with relief. MD notified. Call bell placed within reach. Will continue to monitor and maintain safety.

## 2021-12-09 LAB — RENAL FUNCTION PANEL
Albumin: 3.1 g/dL — ABNORMAL LOW (ref 3.5–5.0)
Anion gap: 14 (ref 5–15)
BUN: 88 mg/dL — ABNORMAL HIGH (ref 6–20)
CO2: 21 mmol/L — ABNORMAL LOW (ref 22–32)
Calcium: 8.7 mg/dL — ABNORMAL LOW (ref 8.9–10.3)
Chloride: 101 mmol/L (ref 98–111)
Creatinine, Ser: 9.15 mg/dL — ABNORMAL HIGH (ref 0.61–1.24)
GFR, Estimated: 7 mL/min — ABNORMAL LOW (ref >60)
Glucose, Bld: 97 mg/dL (ref 70–99)
Phosphorus: 6.6 mg/dL — ABNORMAL HIGH (ref 2.5–4.6)
Potassium: 5.7 mmol/L — ABNORMAL HIGH (ref 3.5–5.1)
Sodium: 136 mmol/L (ref 135–145)

## 2021-12-09 LAB — CBC
HCT: 35.3 % — ABNORMAL LOW (ref 39.0–52.0)
Hemoglobin: 10.8 g/dL — ABNORMAL LOW (ref 13.0–17.0)
MCH: 24.1 pg — ABNORMAL LOW (ref 26.0–34.0)
MCHC: 30.6 g/dL (ref 30.0–36.0)
MCV: 78.6 fL — ABNORMAL LOW (ref 80.0–100.0)
Platelets: 154 10*3/uL (ref 150–400)
RBC: 4.49 MIL/uL (ref 4.22–5.81)
RDW: 17.8 % — ABNORMAL HIGH (ref 11.5–15.5)
WBC: 8.1 10*3/uL (ref 4.0–10.5)
nRBC: 0 % (ref 0.0–0.2)

## 2021-12-09 MED ORDER — HEPARIN SODIUM (PORCINE) 1000 UNIT/ML IJ SOLN
INTRAMUSCULAR | Status: AC
  Filled 2021-12-09: qty 4

## 2021-12-09 MED ORDER — ANTICOAGULANT SODIUM CITRATE 4% (200MG/5ML) IV SOLN: 5.0000 mL | Status: DC | PRN

## 2021-12-09 MED ORDER — SALINE SPRAY 0.65 % NA SOLN
1.0000 | NASAL | Status: DC | PRN
  Filled 2021-12-09: qty 44

## 2021-12-09 MED ORDER — ALTEPLASE 2 MG IJ SOLR: 2.0000 mg | Freq: Once | INTRAMUSCULAR | Status: DC | PRN

## 2021-12-09 MED ORDER — HEPARIN SODIUM (PORCINE) 1000 UNIT/ML DIALYSIS
1000.0000 [IU] | INTRAMUSCULAR | Status: DC | PRN
  Filled 2021-12-09 (×2): qty 1

## 2021-12-09 NOTE — Progress Notes (Addendum)
Tx initiated via [facility]. Aspirates and flushes well. Dressings changed per protocol. No signs and symptoms of infection. Connected A-A V-V. BFR achieved. No UF. Lines secured. Access visible.  Mid treatment, his CBC result is not in yet. Called lab for a follow up and was told that they will call again and check. Called lab again 30 minutes before the treatment ends and was told to draw another sample. Sample collected and sent to lab.

## 2021-12-09 NOTE — Progress Notes (Signed)
Physical Therapy Evaluation Patient Details Name: Frank Coffey MRN: [id number] DOB: 29-Nov-1980 Today's Date: 12/09/2021  History of Present Illness  Pt is 41 yo male admitted with hyperkalemia and AKI on chronic CKD stage V. Pt started on HD 12/08/21.  Pt with hx of HTN, HLD, CHF, CKD, and morbid obesity.  Clinical Impression  Pt admitted with above diagnosis.  He was able to ambulate 300' in hallway with safe gait.  Pt has been ambulating in room independently and demonstrated safely.  VSS throughout session. Pt near baseline - no acute PT indicated.     Recommendations for follow up therapy are one component of a multi-disciplinary discharge planning process, led by the attending physician.  Recommendations may be updated based on patient status, additional functional criteria and insurance authorization.  Follow Up Recommendations No PT follow up      Assistance Recommended at Discharge PRN  Patient can return home with the following       Equipment Recommendations None recommended by PT  Recommendations for Other Services       Functional Status Assessment Patient has not had a recent decline in their functional status     Precautions / Restrictions Precautions Precautions: Fall      Mobility  Bed Mobility Overal bed mobility: Independent             General bed mobility comments: in chair at arrival    Transfers Overall transfer level: Independent Equipment used: None               General transfer comment: Demonstrated sit to stand, stand pivots independently.  Stood from chair and toilet.    Ambulation/Gait Ambulation/Gait assistance: Independent Gait Distance (Feet): 300 Feet Assistive device: None Gait Pattern/deviations: WFL(Within Functional Limits) Gait velocity: normal     General Gait Details: Ambulated in hallway with supervision but has been ambulating in room independently and demonstrated safely  Stairs             Wheelchair Mobility    Modified Rankin (Stroke Patients Only)       Balance Overall balance assessment: Independent Sitting-balance support: No upper extremity supported Sitting balance-Leahy Scale: Normal     Standing balance support: No upper extremity supported Standing balance-Leahy Scale: Normal                               Pertinent Vitals/Pain Pain Assessment Pain Assessment: No/denies pain    Home Living Family/patient expects to be discharged to:: Private residence Living Arrangements: Spouse/significant other Available Help at Discharge: Family;Available PRN/intermittently (wife works and is out often) Type of Home: House Home Access: Level entry     Alternate Level Stairs-Number of Steps: flight Home Layout: Two level;Bed/bath upstairs Home Equipment: None      Prior Function Prior Level of Function : Independent/Modified Independent;Driving             Mobility Comments: ambulates in community wihtout AD ADLs Comments: independent ADLs and IADLs; working     Journalist, newspaper        Extremity/Trunk Assessment   Upper Extremity Assessment Upper Extremity Assessment: Overall WFL for tasks assessed    Lower Extremity Assessment Lower Extremity Assessment: Overall WFL for tasks assessed    Cervical / Trunk Assessment Cervical / Trunk Assessment: Other exceptions Cervical / Trunk Exceptions: morbid obesity  Communication   Communication: No difficulties  Cognition Arousal/Alertness: Awake/alert Behavior During Therapy: WFL for tasks assessed/performed  Overall Cognitive Status: Within Functional Limits for tasks assessed                                          General Comments General comments (skin integrity, edema, etc.): VSS    Exercises     Assessment/Plan    PT Assessment Patient does not need any further PT services  PT Problem List         PT Treatment Interventions      PT Goals  (Current goals can be found in the Care Plan section)  Acute Rehab PT Goals Patient Stated Goal: return home PT Goal Formulation: All assessment and education complete, DC therapy    Frequency       Co-evaluation               AM-PAC PT "6 Clicks" Mobility  Outcome Measure Help needed turning from your back to your side while in a flat bed without using bedrails?: None Help needed moving from lying on your back to sitting on the side of a flat bed without using bedrails?: None Help needed moving to and from a bed to a chair (including a wheelchair)?: None Help needed standing up from a chair using your arms (e.g., wheelchair or bedside chair)?: None Help needed to walk in hospital room?: None Help needed climbing 3-5 steps with a railing? : A Little 6 Click Score: 23    End of Session   Activity Tolerance: Patient tolerated treatment well Patient left: in chair;with call bell/phone within reach Nurse Communication: Mobility status PT Visit Diagnosis: Other abnormalities of gait and mobility (R26.89)    Time: 7915-0569 PT Time Calculation (min) (ACUTE ONLY): 14 min   Charges:   PT Evaluation $PT Eval Low Complexity: 1 Low          Zoriah Pulice, PT Acute Rehab [facility] [phone]   Karlton Lemon 12/09/2021, 2:12 PM

## 2021-12-09 NOTE — Progress Notes (Signed)
Refused cpap.

## 2021-12-09 NOTE — Progress Notes (Signed)
PROGRESS NOTE    Frank Coffey  HWY:[id number] DOB: 09/11/1980 DOA: 12/06/2021 PCP: Flossie Buffy, NP  Chief Complaint  Patient presents with   Chest Pain    Brief Narrative:  Frank Coffey is Frank Coffey 41 y.o. 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  Patient reports that he has been sleeping more than normal.  Associated symptoms included nausea, substernal/right-sided chest discomfort, bilateral leg pains, and constipation.  Denies having any recent fall/trauma, vomiting, cough, shortness of breath, or significant leg swelling.  He reports that he has been able to make urine.  He is followed by Dr. Jimmy Footman of [facility], but states that he has been switched over to Dr. Posey Pronto after he left.  Ultimately he would like to be on peritoneal dialysis.    In the emergency department patient was noted to be afebrile with tachypnea, and hypoxic down to 88% placed on high flow nasal cannula oxygen.  Initial labs yesterday revealed potassium 6.6, CO2 19, BUN 116, creatinine 11.82, and high-sensitivity troponin 24->35.  Patient has been given calcium gluconate, 5 units of insulin, dextrose,  and Lokelma 10 g p.o.  Repeat potassium had been noted to be 5.6->4.8. Nephrology consulted and placed orders for IR for tunneled hemodialysis catheter   Assessment & Plan:   Principal Problem:   Hyperkalemia Active Problems:   Acute kidney injury superimposed on chronic kidney disease (HCC)   Elevated troponin   Chest pain   Essential hypertension   Combined systolic and diastolic cardiac dysfunction   Anemia due to chronic kidney disease   Hyperlipidemia   Irritable bowel syndrome with constipation   Morbid obesity with BMI of 50.0-59.9, adult ([place])  Acute kidney injury superimposed on chronic  kidney disease stage V Patient presented with complaints of weakness, fatigue, and nausea.  Labs revealed creatinine elevated up to 11.82 with BUN 116.  His creatinine had been 6.4 on 10/2.  He is still able to make urine.  Patient symptoms thought to likely be associated with uremia with worsening kidney function.  Nephrology consulted IR for tunneled hemodialysis catheter placement. -s/p TDC by IR on 11/23 -HD per renal.  Apparently plans for PD outpatient?  CLIP in progress.   OHS  OSA  Oxygen Requirement Requiring significant O2, CXR without active cardiopulm disease Suspect this is mostly related to OSA/OHS as he doesn't have any significant requirement at rest, but when he falls asleep, needs significant O2 He uses bipap at home, has been refusing this here Encouraged him to bring home bipap if he'll wear this  Nausea  Vomiting - resolved, follow  Chest pain  Mildly Elevated troponins Patient reports having chest discomfort substernally and to the right side.  On physical exam patient with tenderness palpation of the chest wall.  High-sensitivity troponins 24->35.  Symptoms appear to be atypical in nature. -Continue to monitor -resolved today, follow   Hyperkalemia Fluctuating, follow   Essential hypertension -Nephrology recommends holding blood pressure medications while initiating dialysis   Combined systolic and diastolic CHF Euvolemic.  Patient does not appear grossly fluid overloaded on physical exam.  Last echocardiogram revealed EF of 35 to 40% with grade 1 diastolic dysfunction when last checked 11/08/2021. -Fluid management with dialysis   Anemia of chronic kidney disease -Continue to monitor, likely related to renal disease  Hyperlipidemia -Continue atorvastatin   Metabolic acidosis  Due to renal disease   Irritable bowel syndrome with constipation -Continue Linzess   Morbid obesity BMI 56.26 kg/m -Continue to counsel on need for weight loss.  Patient  would benefit from referral to bariatrics    DVT prophylaxis: heparin Code Status: full Family Communication: none at bedside Disposition:   Status is: Inpatient Remains inpatient appropriate because: continued need for dialysis   Consultants:  IR nephrology  Procedures:  IMPRESSION: Successful placement of 23 cm tip to cuff tunneled hemodialysis catheter via the right internal jugular vein with catheter tip terminating within the right atrium. The catheter is ready for immediate use.     Antimicrobials:  Anti-infectives (From admission, onward)    Start     Dose/Rate Route Frequency Ordered Stop   12/07/21 1652  ceFAZolin (ANCEF) 2-4 GM/100ML-% IVPB       Note to Pharmacy: Ricky Stabs C: cabinet override      12/07/21 1652 12/07/21 1725   12/07/21 1650  ceFAZolin (ANCEF) IVPB 2g/100 mL premix        over 30 Minutes Intravenous Continuous PRN 12/07/21 1704 12/07/21 1650       Subjective: No complaints today  Objective: Vitals:   12/09/21 1036 12/09/21 1100 12/09/21 1117 12/09/21 1120  BP:  (!) 160/123  (!) 175/117  Pulse: 67 71 74 74  Resp: _0 Temp:    98.9 F (37.2 C)  TempSrc:    Axillary  SpO2: 99% 98% 93% 97%  Weight:    (!) 166.1 kg  Height:        Intake/Output Summary (Last 24 hours) at 12/09/2021 1552 Last data filed at 12/09/2021 1120 Gross per 24 hour  Intake --  Output 0 ml  Net 0 ml   Filed Weights   12/09/21 0402 12/09/21 0758 12/09/21 1120  Weight: (!) 164.8 kg (!) 166.1 kg (!) 166.1 kg    Examination:  General: No acute distress. Cardiovascular: RRR Lungs: unlabored Abdomen: Soft, nontender, nondistended  Neurological: Sleepy, but alert, appropriate in conversation. Moves all extremities 4 with equal strength. Cranial nerves II through XII grossly intact. Extremities: No clubbing or cyanosis. No edema.   Data Reviewed: I have personally reviewed following labs and imaging studies  CBC: Recent Labs  Lab  12/06/21 1205 12/08/21 0722 12/09/21 1108  WBC 8.9 7.9 8.1  HGB 11.4* 11.0* 10.8*  HCT 38.0* 34.9* 35.3*  MCV 78.7* 77.6* 78.6*  PLT 220 192 222    Basic Metabolic Panel: Recent Labs  Lab 12/06/21 1205 12/06/21 1354 12/06/21 1635 12/06/21 2203 12/07/21 0746 12/08/21 0722 12/09/21 0132  NA 131*  --  133*  --  134* 136 136  K 6.6*  --  5.6* 4.8 5.3* 4.9 5.7*  CL 105  --  107  --  107 104 101  CO2 19*  --  18*  --  20* 22 21*  GLUCOSE 102*  --  84  --  115* 98 97  BUN 116*  --  114*  --  118* 82* 88*  CREATININE 11.82*  --  11.33*  --  11.18* 8.94* 9.15*  CALCIUM 8.6*  --  8.5*  --  8.6* 8.5* 8.7*  MG  --  2.3  --   --   --   --   --   PHOS  --  6.7*  --   --   --  6.6* 6.6*    GFR: Estimated Creatinine Clearance:  15.8 mL/min (Mirenda Baltazar) (by C-G formula based on SCr of 9.15 mg/dL (H)).  Liver Function Tests: Recent Labs  Lab 12/06/21 1205 12/08/21 0722 12/09/21 0132  AST 25  --   --   ALT 31  --   --   ALKPHOS 72  --   --   BILITOT 0.7  --   --   PROT 8.7*  --   --   ALBUMIN 3.5 3.2* 3.1*    CBG: Recent Labs  Lab 12/06/21 1446 12/06/21 1537 12/07/21 2058  GLUCAP 87 138* 178*     No results found for this or any previous visit (from the past 240 hour(s)).       Radiology Studies: IR Fluoro Guide CV Line Right  Result Date: 12/07/2021 INDICATION: 41 year old male with end-stage renal disease requiring initiation of hemodialysis presenting for central venous catheter. EXAM: TUNNELED CENTRAL VENOUS HEMODIALYSIS CATHETER PLACEMENT WITH ULTRASOUND AND FLUOROSCOPIC GUIDANCE MEDICATIONS: Ancef 2 gm IV . The antibiotic was given in an appropriate time interval prior to skin puncture. ANESTHESIA/SEDATION: Moderate (conscious) sedation was employed during this procedure. Edithe Dobbin total of Versed 0 mg and Fentanyl 100 mcg was administered intravenously. Moderate Sedation Time: 18 minutes. The patient's level of consciousness and vital signs were monitored continuously by  radiology nursing throughout the procedure under my direct supervision. FLUOROSCOPY TIME:  96.7 mGy COMPLICATIONS: None immediate. PROCEDURE: Informed written consent was obtained from the patient after Zacharie Portner discussion of the risks, benefits, and alternatives to treatment. Questions regarding the procedure were encouraged and answered. The right neck and chest were prepped with chlorhexidine in Jeren Dufrane sterile fashion, and Madelaine Whipple sterile drape was applied covering the operative field. Maximum barrier sterile technique with sterile gowns and gloves were used for the procedure. Aneeka Bowden timeout was performed prior to the initiation of the procedure. After creating Lucielle Vokes small venotomy incision, Pinchus Weckwerth 21 gauge micropuncture kit was utilized to access the internal jugular vein. Real-time ultrasound guidance was utilized for vascular access including the acquisition of Amanee Iacovelli permanent ultrasound image documenting patency of the accessed vessel. Newell Frater Rosen wire was advanced to the level of the IVC and the micropuncture sheath was exchanged for an 8 Fr dilator. Melysa Schroyer 14.5 French tunneled hemodialysis catheter measuring 23 cm from tip to cuff was tunneled in Lutricia Widjaja retrograde fashion from the anterior chest wall to the venotomy incision. Serial dilation was then performed an Crislyn Willbanks peel-away sheath was placed. The catheter was then placed through the peel-away sheath with the catheter tip ultimately positioned within the right atrium. Final catheter positioning was confirmed and documented with Kal Chait spot radiographic image. The catheter aspirates and flushes normally. The catheter was flushed with appropriate volume heparin dwells. The catheter exit site was secured with Wadsworth Skolnick 0-Silk retention suture. The venotomy incision was closed with Dermabond. Sterile dressings were applied. The patient tolerated the procedure well without immediate post procedural complication. IMPRESSION: Successful placement of 23 cm tip to cuff tunneled hemodialysis catheter via the right internal  jugular vein with catheter tip terminating within the right atrium. The catheter is ready for immediate use. Ruthann Cancer, MD [facility] Electronically Signed   By: Ruthann Cancer M.D.   On: 12/07/2021 19:52   IR US Guide Vasc Access Right  Result Date: 12/07/2021 INDICATION: 41 year old male with end-stage renal disease requiring initiation of hemodialysis presenting for central venous catheter. EXAM: TUNNELED CENTRAL VENOUS HEMODIALYSIS CATHETER PLACEMENT WITH ULTRASOUND AND FLUOROSCOPIC GUIDANCE MEDICATIONS: Ancef 2 gm IV . The antibiotic was  given in an appropriate time interval prior to skin puncture. ANESTHESIA/SEDATION: Moderate (conscious) sedation was employed during this procedure. Safiya Girdler total of Versed 0 mg and Fentanyl 100 mcg was administered intravenously. Moderate Sedation Time: 18 minutes. The patient's level of consciousness and vital signs were monitored continuously by radiology nursing throughout the procedure under my direct supervision. FLUOROSCOPY TIME:  27.2 mGy COMPLICATIONS: None immediate. PROCEDURE: Informed written consent was obtained from the patient after Tregan Read discussion of the risks, benefits, and alternatives to treatment. Questions regarding the procedure were encouraged and answered. The right neck and chest were prepped with chlorhexidine in Yohanna Tow sterile fashion, and Dessire Grimes sterile drape was applied covering the operative field. Maximum barrier sterile technique with sterile gowns and gloves were used for the procedure. Royer Cristobal timeout was performed prior to the initiation of the procedure. After creating Caide Campi small venotomy incision, Tameca Jerez 21 gauge micropuncture kit was utilized to access the internal jugular vein. Real-time ultrasound guidance was utilized for vascular access including the acquisition of Seona Clemenson permanent ultrasound image documenting patency of the accessed vessel. Torii Royse Rosen wire was advanced to the level of the IVC and the  micropuncture sheath was exchanged for an 8 Fr dilator. Amar Keenum 14.5 French tunneled hemodialysis catheter measuring 23 cm from tip to cuff was tunneled in Nichole Keltner retrograde fashion from the anterior chest wall to the venotomy incision. Serial dilation was then performed an Rami Budhu peel-away sheath was placed. The catheter was then placed through the peel-away sheath with the catheter tip ultimately positioned within the right atrium. Final catheter positioning was confirmed and documented with Addilyne Backs spot radiographic image. The catheter aspirates and flushes normally. The catheter was flushed with appropriate volume heparin dwells. The catheter exit site was secured with Rmani Kapusta 0-Silk retention suture. The venotomy incision was closed with Dermabond. Sterile dressings were applied. The patient tolerated the procedure well without immediate post procedural complication. IMPRESSION: Successful placement of 23 cm tip to cuff tunneled hemodialysis catheter via the right internal jugular vein with catheter tip terminating within the right atrium. The catheter is ready for immediate use. Ruthann Cancer, MD [facility] Radiology Electronically Signed   By: Ruthann Cancer M.D.   On: 12/07/2021 19:52        Scheduled Meds:  allopurinol  100 mg Oral Q M,W,F-HD   atorvastatin  20 mg Oral Daily   brimonidine  1 drop Both Eyes Q8H   Chlorhexidine Gluconate Cloth  6 each Topical Q0600   dorzolamide-timolol  1 drop Both Eyes BID   heparin  5,000 Units Subcutaneous Q8H   latanoprost  1 drop Left Eye QHS   linaclotide  145 mcg Oral QAC breakfast   pantoprazole  40 mg Oral Daily   prednisoLONE acetate  1 drop Both Eyes TID   sodium chloride flush  3 mL Intravenous Q12H   Continuous Infusions:  promethazine (PHENERGAN) injection (IM or IVPB)       LOS: 2 days    Time spent: over 30 min    Fayrene Helper, MD [facility]   To contact the attending provider between 7A-7P or  the covering provider during after hours 7P-7A, please log into the web site [email or web address] and access using universal [place] password for that web site. If you do not have the password, please call the hospital operator.  12/09/2021, 3:52 PM

## 2021-12-09 NOTE — Progress Notes (Signed)
Bipap refused 

## 2021-12-09 NOTE — Progress Notes (Signed)
Received patient in bed to unit.  Alert and oriented.  Informed consent signed and in chart.    Patient tolerated well.  Transported back to the room  Alert, without acute distress.  Hand-off given to patient's nurse.   Access used: [facility] Access issues: none  Total UF removed: 0 Medication(s) given: Tylenol    [facility]

## 2021-12-09 NOTE — Progress Notes (Signed)
Nephrology Follow-Up Consult note   Assessment/Recommendations: Frank Coffey is a/an 41 y.o. male with a past medical history significant for CKD V, admitted for advancement to CKD.      # ESRD: CKD V w/ AKI and no clear inciting factor but considered to have progressed to ESRD at this point -IR placed TDC on 11/23. Appreciate help -First HD on 11/23; next HD today then again tomorrow -Planning to do PD outpatient? Will confirm with outpatient providers. Hold on AVF creation for now -CLIP in process   # Volume/ hypertension: BP has been up and down. Appears euvolemic to slightly volume up. Holding BP meds at this time   #Hyperkalemia: 2/2 spiro and K supplements. Improved. CTM. Hold spiro and K supplements   # Anemia of Chronic Kidney Disease: Hemoglobin 11.[name] No intervention needed. CTM for now   #Chest pain: now related to catheter. CTM   #Metabolic Acidosis: likely 2/2 AKI/CKD. 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 Ca correct to normal Obtain PTH   # Additional recommendations: - Dose all meds for creatinine clearance < 10 ml/min  - Unless absolutely necessary, no MRIs with gadolinium.  - Implement save arm precautions.  Prefer needle sticks in the dorsum of the hands or wrists.  No blood pressure measurements in arm. - If blood transfusion is requested during hemodialysis sessions, please alert [place] prior to the session.  - Use synthetic opioids (Fentanyl/Dilaudid) if needed   Recommendations were discussed with the primary team.   Recommendations conveyed to primary service.    [facility] 12/09/2021 8:39 AM  ___________________________________________________________  CC: esrd  Interval History/Subjective: Patient has no complaints today   Medications:  Current Facility-Administered Medications  Medication Dose Route Frequency Provider Last Rate Last Admin   acetaminophen (TYLENOL) tablet 650 mg  650 mg Oral Q6H PRN Fuller Plan A, MD   650 mg at 12/09/21 9892   Or   acetaminophen (TYLENOL) suppository 650 mg  650 mg Rectal Q6H PRN Fuller Plan A, MD       albuterol (PROVENTIL) (2.5 MG/3ML) 0.083% nebulizer solution 3 mL  3 mL Inhalation Q6H PRN Wynetta Fines T, MD       allopurinol (ZYLOPRIM) tablet 100 mg  100 mg Oral Q M,W,F-HD Elodia Florence., MD       alteplase (CATHFLO ACTIVASE) injection 2 mg  2 mg Intracatheter Once PRN Reesa Chew, MD       anticoagulant sodium citrate solution 5 mL  5 mL Intracatheter PRN Reesa Chew, MD       atorvastatin (LIPITOR) tablet 20 mg  20 mg Oral Daily Wynetta Fines T, MD   20 mg at 12/08/21 0819   benzonatate (TESSALON) capsule 100 mg  100 mg Oral TID PRN Lequita Halt, MD       brimonidine (ALPHAGAN) 0.2 % ophthalmic solution 1 drop  1 drop Both Eyes Q8H Wynetta Fines T, MD   1 drop at 12/08/21 2039   Chlorhexidine Gluconate Cloth 2 % PADS 6 each  6 each Topical Q0600 Reesa Chew, MD   6 each at 12/09/21 0518   dorzolamide-timolol (COSOPT) 2-0.5 % ophthalmic solution 1 drop  1 drop Both Eyes BID Wynetta Fines T, MD   1 drop at 12/08/21 2039   heparin injection 1,000 Units  1,000 Units Intracatheter PRN Reesa Chew, MD       heparin injection 5,000 Units  5,000 Units Subcutaneous Q8H Tamala Julian,  Rondell A, MD   5,000 Units at 12/09/21 0518   hydrALAZINE (APRESOLINE) injection 5 mg  5 mg Intravenous Q6H PRN Wynetta Fines T, MD       latanoprost (XALATAN) 0.005 % ophthalmic solution 1 drop  1 drop Left Eye QHS Wynetta Fines T, MD   1 drop at 12/08/21 2039   linaclotide (LINZESS) capsule 145 mcg  145 mcg Oral QAC breakfast Wynetta Fines T, MD   145 mcg at 12/08/21 0818   ondansetron (ZOFRAN) tablet 4 mg  4 mg Oral Q8H PRN Wynetta Fines T, MD   4 mg at 12/08/21 1052   pantoprazole (PROTONIX) EC tablet 40 mg  40 mg Oral Daily Wynetta Fines T, MD   40 mg at 12/08/21 0818   prednisoLONE acetate (PRED FORTE) 1 % ophthalmic suspension 1 drop  1 drop Both Eyes TID Wynetta Fines  T, MD   1 drop at 12/08/21 2038   promethazine (PHENERGAN) tablet 25 mg  25 mg Oral Q6H PRN Elodia Florence., MD   25 mg at 12/08/21 1710   Or   promethazine (PHENERGAN) 6.25 mg in sodium chloride 0.9 % 50 mL IVPB  6.25 mg Intravenous Q6H PRN Elodia Florence., MD       Or   promethazine (PHENERGAN) suppository 25 mg  25 mg Rectal Q6H PRN Elodia Florence., MD       sodium chloride flush (NS) 0.9 % injection 3 mL  3 mL Intravenous Q12H Fuller Plan A, MD   3 mL at 12/08/21 2040      Review of Systems: 10 systems reviewed and negative except per interval history/subjective  Physical Exam: Vitals:   12/09/21 0758 12/09/21 0812  BP: (!) 142/85 (!) 143/95  Pulse: 60 (!) 55  Resp: 15 12  Temp: 99.5 F (37.5 C)   SpO2: 90% 100%   No intake/output data recorded. No intake or output data in the 24 hours ending 12/09/21 0839  Constitutional: well-appearing, no acute distress, obese ENMT: ears and nose without scars or lesions, MMM CV: normal rate, trace edema in ankles Respiratory: bilateral chest rise, normal work of breathing Gastrointestinal: soft, non-tender, no palpable masses or hernias Skin: no visible lesions or rashes Psych: alert, judgement/insight appropriate, appropriate mood and affect   Test Results I personally reviewed new and old clinical labs and radiology tests Lab Results  Component Value Date   NA 136 12/09/2021   K 5.7 (H) 12/09/2021   CL 101 12/09/2021   CO2 21 (L) 12/09/2021   BUN 88 (H) 12/09/2021   CREATININE 9.15 (H) 12/09/2021   GFR 9.86 (LL) 10/17/2021   GLU 85 11/05/2021   CALCIUM 8.7 (L) 12/09/2021   ALBUMIN 3.1 (L) 12/09/2021   PHOS 6.6 (H) 12/09/2021    CBC Recent Labs  Lab 12/06/21 1205 12/08/21 0722  WBC 8.9 7.9  HGB 11.4* 11.0*  HCT 38.0* 34.9*  MCV 78.7* 77.6*  PLT 220 192

## 2021-12-09 NOTE — Progress Notes (Signed)
Pt refusing BiPAP, ordered for nighttime use. Discussed the importance of wearing while he is asleep but he continued to refuse. Respiratory therapy aware.

## 2021-12-10 LAB — RENAL FUNCTION PANEL
Albumin: 3.2 g/dL — ABNORMAL LOW (ref 3.5–5.0)
Anion gap: 8 (ref 5–15)
BUN: 61 mg/dL — ABNORMAL HIGH (ref 6–20)
CO2: 27 mmol/L (ref 22–32)
Calcium: 8.8 mg/dL — ABNORMAL LOW (ref 8.9–10.3)
Chloride: 100 mmol/L (ref 98–111)
Creatinine, Ser: 7.31 mg/dL — ABNORMAL HIGH (ref 0.61–1.24)
GFR, Estimated: 9 mL/min — ABNORMAL LOW (ref >60)
Glucose, Bld: 100 mg/dL — ABNORMAL HIGH (ref 70–99)
Phosphorus: 5.4 mg/dL — ABNORMAL HIGH (ref 2.5–4.6)
Potassium: 4.2 mmol/L (ref 3.5–5.1)
Sodium: 135 mmol/L (ref 135–145)

## 2021-12-10 LAB — CBC
HCT: 36.3 % — ABNORMAL LOW (ref 39.0–52.0)
Hemoglobin: 10.9 g/dL — ABNORMAL LOW (ref 13.0–17.0)
MCH: 24 pg — ABNORMAL LOW (ref 26.0–34.0)
MCHC: 30 g/dL (ref 30.0–36.0)
MCV: 79.8 fL — ABNORMAL LOW (ref 80.0–100.0)
Platelets: 145 10*3/uL — ABNORMAL LOW (ref 150–400)
RBC: 4.55 MIL/uL (ref 4.22–5.81)
RDW: 17.8 % — ABNORMAL HIGH (ref 11.5–15.5)
WBC: 7.9 10*3/uL (ref 4.0–10.5)
nRBC: 0 % (ref 0.0–0.2)

## 2021-12-10 LAB — HEPATITIS B SURFACE ANTIBODY, QUANTITATIVE: Hep B S AB Quant (Post): <3.1 m[IU]/mL — ABNORMAL LOW (ref >9.9)

## 2021-12-10 LAB — PARATHYROID HORMONE, INTACT (NO CA): PTH: 166 pg/mL — ABNORMAL HIGH (ref 15–65)

## 2021-12-10 MED ORDER — POLYETHYLENE GLYCOL 3350 17 G PO PACK
17.0000 g | PACK | Freq: Every day | ORAL | Status: DC
  Administered 2021-12-10 – 2021-12-11 (×2): 17 g via ORAL
  Filled 2021-12-10 (×3): qty 1

## 2021-12-10 MED ORDER — DICLOFENAC SODIUM 1 % EX GEL
2.0000 g | Freq: Four times a day (QID) | CUTANEOUS | Status: DC | PRN
  Filled 2021-12-10: qty 100

## 2021-12-10 MED ORDER — ALLOPURINOL 300 MG PO TABS: 150.0000 mg | ORAL_TABLET | ORAL | Status: DC

## 2021-12-10 MED ORDER — HEPARIN SODIUM (PORCINE) 1000 UNIT/ML IJ SOLN
INTRAMUSCULAR | Status: AC
  Administered 2021-12-10: 4000 [IU]
  Filled 2021-12-10: qty 4

## 2021-12-10 NOTE — Progress Notes (Signed)
Pt refused cpap. Pt said does not feel comfortable with it

## 2021-12-10 NOTE — Progress Notes (Signed)
OT Cancellation Note  Patient Details Name: Frank Coffey MRN: [id number] DOB: 12/20/1980   Cancelled Treatment:    Reason Eval/Treat Not Completed: Patient at procedure or test/ unavailable.    Jolaine Artist, OT Acute Rehabilitation Services Office [phone]   Delight Stare 12/10/2021, 7:43 AM

## 2021-12-10 NOTE — Progress Notes (Addendum)
Received patient in bed to unit.  Alert and oriented.  Informed consent signed and in chart.   Treatment initiated: 740a Treatment completed: 1124a  Patient tolerated well.  Transported back to the room  Alert, without acute distress.  Hand-off given to patient's nurse.   Access used: Yes Access issues: No  Total UF removed: 1.5L Medication(s) given: No Post HD VS: T 98.2, BP 165/107, 22, 87, O2 99% on 2L N/C Post HD weight: 162.3 kg   [facility]  12/10/21 1124  Pain Assessment  Pain Scale 0-10  Pain Score 0  Neurological  Level of Consciousness Alert  Orientation Level Oriented X4  Respiratory  Respiratory Pattern Regular;Unlabored  Chest Assessment Chest expansion symmetrical  Bilateral Breath Sounds Clear;Diminished  Cardiac  Pulse Regular  Cardiac Rhythm NSR;SB

## 2021-12-10 NOTE — Progress Notes (Signed)
Nephrology Follow-Up Consult note   Assessment/Recommendations: Frank Coffey is a/an 41 y.o. male with a past medical history significant for CKD V, admitted for advancement to CKD.      # ESRD: CKD V w/ AKI and no clear inciting factor but considered to have progressed to ESRD at this point -IR placed TDC on 11/23. Appreciate help -First HD on 11/23; today is 3/3 initiation sessions -Next HD likely tuesday -Planning to do PD outpatient? Will confirm with outpatient providers. Hold on AVF creation for now -CLIP in process   # Volume/ hypertension: BP has been up and down. Appears euvolemic to slightly volume up. Holding BP meds at this time   #Hyperkalemia: improved with holding outpatient meds and HD   # Anemia of Chronic Kidney Disease: Hemoglobin ~11.. No intervention needed. CTM for now   #Metabolic Acidosis: resolved  #CKD-BMD: phos 5.4 ctm. Ca correct to normal Obtain PTH   # Additional recommendations: - Dose all meds for creatinine clearance < 10 ml/min  - Unless absolutely necessary, no MRIs with gadolinium.  - Implement save arm precautions.  Prefer needle sticks in the dorsum of the hands or wrists.  No blood pressure measurements in arm. - If blood transfusion is requested during hemodialysis sessions, please alert [place] prior to the session.  - Use synthetic opioids (Fentanyl/Dilaudid) if needed   Recommendations were discussed with the primary team.   Recommendations conveyed to primary service.    [facility] 12/10/2021 8:42 AM  ___________________________________________________________  CC: esrd  Interval History/Subjective: Patient feels well with no complaints.   Medications:  Current Facility-Administered Medications  Medication Dose Route Frequency Provider Last Rate Last Admin   acetaminophen (TYLENOL) tablet 650 mg  650 mg Oral Q6H PRN Fuller Plan A, MD   650 mg at 12/10/21 9528   Or   acetaminophen  (TYLENOL) suppository 650 mg  650 mg Rectal Q6H PRN Fuller Plan A, MD       albuterol (PROVENTIL) (2.5 MG/3ML) 0.083% nebulizer solution 3 mL  3 mL Inhalation Q6H PRN Wynetta Fines T, MD       allopurinol (ZYLOPRIM) tablet 100 mg  100 mg Oral Q M,W,F-HD Elodia Florence., MD   100 mg at 12/09/21 1435   atorvastatin (LIPITOR) tablet 20 mg  20 mg Oral Daily Wynetta Fines T, MD   20 mg at 12/09/21 1417   benzonatate (TESSALON) capsule 100 mg  100 mg Oral TID PRN Lequita Halt, MD       brimonidine (ALPHAGAN) 0.2 % ophthalmic solution 1 drop  1 drop Both Eyes Q8H Wynetta Fines T, MD   1 drop at 12/09/21 2106   Chlorhexidine Gluconate Cloth 2 % PADS 6 each  6 each Topical Q0600 Reesa Chew, MD   6 each at 12/10/21 0518   dorzolamide-timolol (COSOPT) 2-0.5 % ophthalmic solution 1 drop  1 drop Both Eyes BID Wynetta Fines T, MD   1 drop at 12/09/21 2105   heparin injection 5,000 Units  5,000 Units Subcutaneous Q8H Fuller Plan A, MD   5,000 Units at 12/10/21 0517   hydrALAZINE (APRESOLINE) injection 5 mg  5 mg Intravenous Q6H PRN Wynetta Fines T, MD       latanoprost (XALATAN) 0.005 % ophthalmic solution 1 drop  1 drop Left Eye QHS Wynetta Fines T, MD   1 drop at 12/09/21 2107   linaclotide (LINZESS) capsule 145 mcg  145 mcg Oral QAC breakfast Wynetta Fines T,  MD   145 mcg at 12/09/21 1435   ondansetron (ZOFRAN) tablet 4 mg  4 mg Oral Q8H PRN Wynetta Fines T, MD   4 mg at 12/08/21 1052   pantoprazole (PROTONIX) EC tablet 40 mg  40 mg Oral Daily Wynetta Fines T, MD   40 mg at 12/09/21 1417   prednisoLONE acetate (PRED FORTE) 1 % ophthalmic suspension 1 drop  1 drop Both Eyes TID Wynetta Fines T, MD   1 drop at 12/09/21 2106   promethazine (PHENERGAN) tablet 25 mg  25 mg Oral Q6H PRN Elodia Florence., MD   25 mg at 12/08/21 1710   Or   promethazine (PHENERGAN) 6.25 mg in sodium chloride 0.9 % 50 mL IVPB  6.25 mg Intravenous Q6H PRN Elodia Florence., MD       Or   promethazine (PHENERGAN) suppository  25 mg  25 mg Rectal Q6H PRN Elodia Florence., MD       sodium chloride (OCEAN) 0.65 % nasal spray 1 spray  1 spray Each Nare PRN Elodia Florence., MD       sodium chloride flush (NS) 0.9 % injection 3 mL  3 mL Intravenous Q12H Fuller Plan A, MD   3 mL at 12/09/21 2107      Review of Systems: 10 systems reviewed and negative except per interval history/subjective  Physical Exam: Vitals:   12/10/21 0741 12/10/21 0801  BP: (!) 176/109 (!) 187/109  Pulse:  77  Resp:  (!) 22  Temp:    SpO2:  93%   No intake/output data recorded.  Intake/Output Summary (Last 24 hours) at 12/10/2021 0842 Last data filed at 12/09/2021 1120 Gross per 24 hour  Intake --  Output 0 ml  Net 0 ml    Constitutional: well-appearing, no acute distress, obese ENMT: ears and nose without scars or lesions, MMM CV: normal rate, trace edema in ankles Respiratory: bilateral chest rise, normal work of breathing Gastrointestinal: soft, non-tender, no palpable masses or hernias Skin: no visible lesions or rashes Psych: alert, judgement/insight appropriate, appropriate mood and affect   Test Results I personally reviewed new and old clinical labs and radiology tests Lab Results  Component Value Date   NA 136 12/09/2021   K 5.7 (H) 12/09/2021   CL 101 12/09/2021   CO2 21 (L) 12/09/2021   BUN 88 (H) 12/09/2021   CREATININE 9.15 (H) 12/09/2021   GFR 9.86 (LL) 10/17/2021   GLU 85 11/05/2021   CALCIUM 8.7 (L) 12/09/2021   ALBUMIN 3.1 (L) 12/09/2021   PHOS 6.6 (H) 12/09/2021    CBC Recent Labs  Lab 12/08/21 0722 12/09/21 1108 12/10/21 0754  WBC 7.9 8.1 7.9  HGB 11.0* 10.8* 10.9*  HCT 34.9* 35.3* 36.3*  MCV 77.6* 78.6* 79.8*  PLT 192 154 145*

## 2021-12-10 NOTE — Evaluation (Signed)
Occupational Therapy Evaluation Patient Details Name: Frank Coffey MRN: [id number] DOB: [birth year] Today's Date: 12/10/2021   History of Present Illness Pt is 41 yo male admitted with hyperkalemia and AKI on chronic CKD stage V. Pt started on HD 12/08/21.  Pt with hx of HTN, HLD, CHF, CKD, and morbid obesity.   Clinical Impression   PTA patient independent and working. Admitted for above and presents at baseline independent level for ADLs, mobility.  Discussed energy conservation and mgmt of fatigue after HD, as this is new for him.  VSS during session.  No further acute OT needs identified and OT will sign off.       Recommendations for follow up therapy are one component of a multi-disciplinary discharge planning process, led by the attending physician.  Recommendations may be updated based on patient status, additional functional criteria and insurance authorization.   Follow Up Recommendations  No OT follow up     Assistance Recommended at Discharge PRN  Patient can return home with the following      Functional Status Assessment     Equipment Recommendations  None recommended by OT    Recommendations for Other Services       Precautions / Restrictions Precautions Precautions: Fall Restrictions Weight Bearing Restrictions: No      Mobility Bed Mobility Overal bed mobility: Independent                  Transfers Overall transfer level: Independent                        Balance Overall balance assessment: Independent, No apparent balance deficits (not formally assessed)                                         ADL either performed or assessed with clinical judgement   ADL Overall ADL's : Independent;At baseline                                             Vision   Vision Assessment?: No apparent visual deficits     Perception     Praxis      Pertinent Vitals/Pain Pain Assessment Pain Assessment:  No/denies pain     Hand Dominance Right   Extremity/Trunk Assessment Upper Extremity Assessment Upper Extremity Assessment: Overall WFL for tasks assessed   Lower Extremity Assessment Lower Extremity Assessment: Defer to PT evaluation   Cervical / Trunk Assessment Cervical / Trunk Assessment: Other exceptions Cervical / Trunk Exceptions: morbid obesity   Communication Communication Communication: No difficulties   Cognition Arousal/Alertness: Awake/alert Behavior During Therapy: WFL for tasks assessed/performed Overall Cognitive Status: Within Functional Limits for tasks assessed                                       General Comments  VSS    Exercises     Shoulder Instructions      Home Living Family/patient expects to be discharged to:: Private residence Living Arrangements: Spouse/significant other Available Help at Discharge: Family;Available PRN/intermittently Type of Home: House Home Access: Level entry     Home Layout: Two level;Bed/bath upstairs Alternate Level Stairs-Number of Steps:  flight Alternate Level Stairs-Rails: Right Bathroom Shower/Tub: Walk-in shower;Tub/shower unit   Bathroom Toilet: Standard     Home Equipment: None          Prior Functioning/Environment Prior Level of Function : Independent/Modified Independent;Driving             Mobility Comments: no AD ADLs Comments: independent ADLs and IADLs; working full time as Freight forwarder at [employer]        OT Problem List:        OT Treatment/Interventions:      OT Goals(Current goals can be found in the care plan section) Acute Rehab OT Goals Patient Stated Goal: home OT Goal Formulation: With patient  OT Frequency:      Co-evaluation              AM-PAC OT "6 Clicks" Daily Activity     Outcome Measure Help from another person eating meals?: None Help from another person taking care of personal grooming?: None Help from another person toileting, which  includes using toliet, bedpan, or urinal?: None Help from another person bathing (including washing, rinsing, drying)?: None Help from another person to put on and taking off regular upper body clothing?: None Help from another person to put on and taking off regular lower body clothing?: None 6 Click Score: 24   End of Session Nurse Communication: Mobility status  Activity Tolerance: Patient tolerated treatment well Patient left: in bed;with call bell/phone within reach  OT Visit Diagnosis: Other abnormalities of gait and mobility (R26.89)                Time: 3790-2409 OT Time Calculation (min): 8 min Charges:  OT General Charges $OT Visit: 1 Visit OT Evaluation $OT Eval Low Complexity: 1 Low  Frank Coffey, OT Acute Rehabilitation Services Office [phone]   Frank Coffey 12/10/2021, 2:59 PM

## 2021-12-10 NOTE — Progress Notes (Signed)
PROGRESS NOTE    Frank Coffey  DTO:[id number] DOB: May 08, 1980 DOA: 12/06/2021 PCP: Flossie Buffy, NP  Chief Complaint  Patient presents with   Chest Pain    Brief Narrative:  [name] is Frank Coffey 41 y.o. 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  Patient reports that he has been sleeping more than normal.  Associated symptoms included nausea, substernal/right-sided chest discomfort, bilateral leg pains, and constipation.  Denies having any recent fall/trauma, vomiting, cough, shortness of breath, or significant leg swelling.  He reports that he has been able to make urine.  He is followed by Dr. Jimmy Footman of [facility], but states that he has been switched over to Dr. Posey Pronto after he left.  Ultimately he would like to be on peritoneal dialysis.    In the emergency department patient was noted to be afebrile with tachypnea, and hypoxic down to 88% placed on high flow nasal cannula oxygen.  Initial labs yesterday revealed potassium 6.6, CO2 19, BUN 116, creatinine 11.82, and high-sensitivity troponin 24->35.  Patient has been given calcium gluconate, 5 units of insulin, dextrose,  and Lokelma 10 g p.o.  Repeat potassium had been noted to be 5.6->4.8. Nephrology consulted and placed orders for IR for tunneled hemodialysis catheter  Assessment & Plan:   Principal Problem:   Hyperkalemia Active Problems:   Acute kidney injury superimposed on chronic kidney disease (HCC)   Elevated troponin   Chest pain   Essential hypertension   Combined systolic and diastolic cardiac dysfunction   Anemia due to chronic kidney disease   Hyperlipidemia   Irritable bowel syndrome with constipation   Morbid obesity with BMI of 50.0-59.9, adult ([place])  Acute kidney injury superimposed on chronic  kidney disease stage V Patient presented with complaints of weakness, fatigue, and nausea.  Labs revealed creatinine elevated up to 11.82 with BUN 116.  His creatinine had been 6.4 on 10/2.  He is still able to make urine.  Patient symptoms thought to likely be associated with uremia with worsening kidney function.  Nephrology consulted IR for tunneled hemodialysis catheter placement. -s/p TDC by IR on 11/23 -HD per renal.  Apparently plans for PD outpatient?  CLIP in progress.   OHS  OSA  Oxygen Requirement At one point was requiring significant oxygen when he was asleep, currently on RA to 2-4 L by [place]  CXR without active cardiopulm disease Suspect this is mostly related to OSA/OHS as he doesn't have any significant requirement at rest, but when he falls asleep, needs significant O2 He uses bipap at home, has been refusing this here Encouraged him to bring home bipap if he'll wear this   Nausea  Vomiting - resolved, follow   Chest pain  Mildly Elevated troponins Patient reports having chest discomfort substernally and to the right side.  High-sensitivity troponins 24->35.  Symptoms appear to be atypical in nature. -Continue to monitor -resolved today, follow   Hyperkalemia Fluctuating, follow   Essential hypertension -Nephrology recommends holding blood pressure medications while initiating dialysis   Combined systolic and diastolic CHF Euvolemic.  Patient does not appear grossly fluid overloaded on physical exam.  Last echocardiogram revealed EF of 35 to 40% with grade 1 diastolic dysfunction when last checked 11/08/2021. -Fluid management with dialysis   Anemia of chronic kidney disease -Continue to  monitor, likely related to renal disease   Hyperlipidemia -Continue atorvastatin   Metabolic acidosis  Due to renal disease   Irritable bowel syndrome with constipation -Continue Linzess   Gout  Was on allopurinol 300 mg daily prior to admission Adjusted based on renal  function He feels like he does when he's going to have Anasha Perfecto gout flare - denies joint pain, but notes discomfort in his shin.  Will adjust allopurinol.  Trial voltaren to shin.  Doesn't sound like actual flare.  Morbid obesity BMI 56.26 kg/m -Continue to counsel on need for weight loss.  Patient would benefit from referral to bariatrics    DVT prophylaxis: heparin Code Status: full Family Communication: friend at bedside Disposition:   Status is: Inpatient Remains inpatient appropriate because: continued need for HD, arranging outpatient HD   Consultants:  IR nephrology  Procedures:  IMPRESSION: Successful placement of 23 cm tip to cuff tunneled hemodialysis catheter via the right internal jugular vein with catheter tip terminating within the right atrium. The catheter is ready for immediate use.  Antimicrobials:  Anti-infectives (From admission, onward)    Start     Dose/Rate Route Frequency Ordered Stop   12/07/21 1652  ceFAZolin (ANCEF) 2-4 GM/100ML-% IVPB       Note to Pharmacy: Ricky Stabs C: cabinet override      12/07/21 1652 12/07/21 1725   12/07/21 1650  ceFAZolin (ANCEF) IVPB 2g/100 mL premix        over 30 Minutes Intravenous Continuous PRN 12/07/21 1704 12/07/21 1650       Subjective: Says he feels like he does when Lesta Limbert gout flare is coming No joint pain, his shin is bothering him  Objective: Vitals:   12/10/21 0930 12/10/21 1001 12/10/21 1112 12/10/21 1125  BP: (!) 178/100 (!) 141/104    Pulse:  74    Resp:  19    Temp:   98.6 F (37 C)   TempSrc:   Oral   SpO2:  96%    Weight:    (!) 162.3 kg  Height:       No intake or output data in the 24 hours ending 12/10/21 1545 Filed Weights   12/09/21 1120 12/10/21 0600 12/10/21 1125  Weight: (!) 166.1 kg (!) 165.9 kg (!) 162.3 kg    Examination:  General exam: Appears calm and comfortable  Respiratory system: unlabored Cardiovascular system: RRR Gastrointestinal system: Abdomen is  nondistended, soft and nontender.  Central nervous system: Alert and oriented. No focal neurological deficits. Extremities: no LEE   Data Reviewed: I have personally reviewed following labs and imaging studies  CBC: Recent Labs  Lab 12/06/21 1205 12/08/21 0722 12/09/21 1108 12/10/21 0754  WBC 8.9 7.9 8.1 7.9  HGB 11.4* 11.0* 10.8* 10.9*  HCT 38.0* 34.9* 35.3* 36.3*  MCV 78.7* 77.6* 78.6* 79.8*  PLT 220 192 154 145*    Basic Metabolic Panel: Recent Labs  Lab 12/06/21 1354 12/06/21 1635 12/06/21 2203 12/07/21 0746 12/08/21 0722 12/09/21 0132 12/10/21 0755  NA  --  133*  --  134* 136 136 135  K  --  5.6* 4.8 5.3* 4.9 5.7* 4.2  CL  --  107  --  107 104 101 100  CO2  --  18*  --  20* 22 21* 27  GLUCOSE  --  84  --  115* 98 97 100*  BUN  --  114*  --  118* 82* 88* 61*  CREATININE  --  11.33*  --  11.18*  8.94* 9.15* 7.31*  CALCIUM  --  8.5*  --  8.6* 8.5* 8.7* 8.8*  MG 2.3  --   --   --   --   --   --   PHOS 6.7*  --   --   --  6.6* 6.6* 5.4*    GFR: Estimated Creatinine Clearance: 19.5 mL/min (Emylee Decelle) (by C-G formula based on SCr of 7.31 mg/dL (H)).  Liver Function Tests: Recent Labs  Lab 12/06/21 1205 12/08/21 0722 12/09/21 0132 12/10/21 0755  AST 25  --   --   --   ALT 31  --   --   --   ALKPHOS 72  --   --   --   BILITOT 0.7  --   --   --   PROT 8.7*  --   --   --   ALBUMIN 3.5 3.2* 3.1* 3.2*    CBG: Recent Labs  Lab 12/06/21 1446 12/06/21 1537 12/07/21 2058  GLUCAP 87 138* 178*     No results found for this or any previous visit (from the past 240 hour(s)).       Radiology Studies: No results found.      Scheduled Meds:  allopurinol  100 mg Oral Q M,W,F-HD   atorvastatin  20 mg Oral Daily   brimonidine  1 drop Both Eyes Q8H   Chlorhexidine Gluconate Cloth  6 each Topical Q0600   dorzolamide-timolol  1 drop Both Eyes BID   heparin  5,000 Units Subcutaneous Q8H   latanoprost  1 drop Left Eye QHS   linaclotide  145 mcg Oral QAC  breakfast   pantoprazole  40 mg Oral Daily   polyethylene glycol  17 g Oral Daily   prednisoLONE acetate  1 drop Both Eyes TID   sodium chloride flush  3 mL Intravenous Q12H   Continuous Infusions:  promethazine (PHENERGAN) injection (IM or IVPB)       LOS: 3 days    Time spent: over 30 min    Fayrene Helper, MD [facility]   To contact the attending provider between 7A-7P or the covering provider during after hours 7P-7A, please log into the web site [email or web address] and access using universal [place] password for that web site. If you do not have the password, please call the hospital operator.  12/10/2021, 3:45 PM

## 2021-12-11 LAB — RENAL FUNCTION PANEL
Albumin: 3.4 g/dL — ABNORMAL LOW (ref 3.5–5.0)
Anion gap: 11 (ref 5–15)
BUN: 46 mg/dL — ABNORMAL HIGH (ref 6–20)
CO2: 24 mmol/L (ref 22–32)
Calcium: 9 mg/dL (ref 8.9–10.3)
Chloride: 98 mmol/L (ref 98–111)
Creatinine, Ser: 6.15 mg/dL — ABNORMAL HIGH (ref 0.61–1.24)
GFR, Estimated: 11 mL/min — ABNORMAL LOW (ref >60)
Glucose, Bld: 99 mg/dL (ref 70–99)
Phosphorus: 3.6 mg/dL (ref 2.5–4.6)
Potassium: 3.8 mmol/L (ref 3.5–5.1)
Sodium: 133 mmol/L — ABNORMAL LOW (ref 135–145)

## 2021-12-11 MED ORDER — SENNOSIDES-DOCUSATE SODIUM 8.6-50 MG PO TABS
1.0000 | ORAL_TABLET | Freq: Every day | ORAL | Status: DC
  Administered 2021-12-12: 1 via ORAL
  Filled 2021-12-11: qty 1

## 2021-12-11 NOTE — Progress Notes (Signed)
Patient refused CPAP.

## 2021-12-11 NOTE — Progress Notes (Signed)
[facility] PROGRESS NOTE  Frank Coffey (DOB: 16-May-1980) TDH:[id number] PCP: Flossie Buffy, NP  Brief Narrative: Frank Coffey is a 41 y.o. 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 [place] placed 11/23 and HD initiated 11/23, 11/24, 11/25. CLIP process initiated.  Subjective: Feels more constipated than usual. Linzess isn't helping like it usually does. TDC site is sore, but better. Otherwise denies pain anywhere. No dyspnea. Wants to get up and walking around more often.   Objective: BP (!) 145/98   Pulse 82   Temp (!) 97.5 F (36.4 C) (Oral)   Resp 18   Ht '5\' 8"'$  (1.727 m)   Wt (!) 162.7 kg   SpO2 100%   BMI 54.54 kg/m   Gen: No distress Pulm: Clear, distant, nonlabored  CV: RRR, no MRG. No pitting GI: Soft, NT, ND, +BS  Neuro: Alert and oriented. No new focal deficits. Ext: Warm, no deformities Skin: No rashes, lesions or ulcers on visualized skin. TDC site c/d/I without erythema, discharge or bruising.  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  Labs revealed creatinine elevated up to 11.82 with BUN 116.  His creatinine had been 6.4 on 10/2.  He is still able to make urine.  Patient symptoms thought to likely be associated with uremia with worsening kidney function.   - s/p TDC by IR on 11/23, initiated HD 11/23,  11/24, 11/25 next 11/28.  - CLIP process initiated.     OHS, OSA, nocturnal hypoxemia:  - Pt has PTSD and does not like to fall completely to sleep in the hospital, so has been declining nocturnal BiPAP. He will accept, and has been getting, supplemental oxygen at night however. Will restart with BiPAP after discharge.    Nausea, vomiting: Most likely due to uremia. Resolved.    Atypical chest pain: Resolved.  - Consider ischemic work up as outpatient after DC.   Elevated troponin: Mild, only 24 > 35 in the setting of ESRD and no anginal-sounding chest pain. No ischemic ECG features.  - Consider ischemic work up as outpatient after DC.    Combined systolic and diastolic CHF Euvolemic.  Patient does not appear grossly fluid overloaded on physical exam.  Last echocardiogram revealed EF of 35 to 40% with grade 1 diastolic dysfunction when last checked 11/08/2021. -Fluid management with dialysis   Anemia of chronic kidney disease: - Monitor intermittently.   Hyperlipidemia: - Continue atorvastatin   NAGMA: Due to ESRD.   Hyperkalemia: Treat with dialysis. Monitor in AM.    Essential hypertension - Nephrology recommends holding blood pressure medications while initiating dialysis  Irritable bowel syndrome with constipation: - Continue linzess, add miralax and senokot. Abd benign   Gout:  - Continue allopurinol (chronic med) adjusted '300mg'$  daily > '150mg'$  qdialysis.  - Continue topical voltaren prn shin pain. Did not have complaints in  this regard today.     Morbid obesity: Body mass index is 54.54 kg/m.  - Weight loss counseling provided.    [name], MD [facility] [email or web address] 12/11/2021, 2:11 PM

## 2021-12-11 NOTE — Progress Notes (Signed)
Nephrology Follow-Up Consult note   Assessment/Recommendations: Frank Coffey is a/an 41 y.o. male with a past medical history significant for CKD V, admitted for advancement to CKD.      # ESRD: CKD V w/ AKI and no clear inciting factor but considered to have progressed to ESRD at this point -IR placed TDC on 11/23. Appreciate help -First HD on 11/23; now s/p 3 HD session -Next HD likely tuesday -Planning to do PD outpatient? Will confirm with outpatient providers. Hold on AVF creation for now -CLIP in process   # Volume/ hypertension: BP has been up and down. Appears euvolemic to slightly volume up. Holding BP meds at this time   #Hyperkalemia: improved with holding outpatient meds and HD   # Anemia of Chronic Kidney Disease: Hemoglobin ~11.. No intervention needed. 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 Ca correct to normal. PTH 166.  #Gout: some concern for flare. Mgmt per primary   # Additional recommendations: - Dose all meds for creatinine clearance < 10 ml/min  - Unless absolutely necessary, no MRIs with gadolinium.  - Implement save arm precautions.  Prefer needle sticks in the dorsum of the hands or wrists.  No blood pressure measurements in arm. - If blood transfusion is requested during hemodialysis sessions, please alert [place] prior to the session.  - Use synthetic opioids (Fentanyl/Dilaudid) if needed   Recommendations were discussed with the primary team.   Recommendations conveyed to primary service.    [facility] 12/11/2021 9:32 AM  ___________________________________________________________  CC: esrd  Interval History/Subjective: Patient feels well with no complaints. Tolerated HD with no issues   Medications:  Current Facility-Administered Medications  Medication Dose Route Frequency Provider Last Rate Last Admin   acetaminophen (TYLENOL) tablet 650 mg  650 mg Oral Q6H PRN Fuller Plan A, MD   650 mg at 12/10/21 2127    Or   acetaminophen (TYLENOL) suppository 650 mg  650 mg Rectal Q6H PRN Fuller Plan A, MD       albuterol (PROVENTIL) (2.5 MG/3ML) 0.083% nebulizer solution 3 mL  3 mL Inhalation Q6H PRN Lequita Halt, MD       [START ON 12/12/2021] allopurinol (ZYLOPRIM) tablet 150 mg  150 mg Oral Q M,W,F-HD Elodia [place]., MD       atorvastatin (LIPITOR) tablet 20 mg  20 mg Oral Daily Wynetta Fines T, MD   20 mg at 12/11/21 0830   benzonatate (TESSALON) capsule 100 mg  100 mg Oral TID PRN Lequita Halt, MD       brimonidine (ALPHAGAN) 0.2 % ophthalmic solution 1 drop  1 drop Both Eyes Q8H Wynetta Fines T, MD   1 drop at 12/11/21 0650   Chlorhexidine Gluconate Cloth 2 % PADS 6 each  6 each Topical Q0600 Reesa Chew, MD   6 each at 12/11/21 0550   diclofenac Sodium (VOLTAREN) 1 % topical gel 2 g  2 g Topical QID PRN Elodia [place]., MD       dorzolamide-timolol (COSOPT) 2-0.5 % ophthalmic solution 1 drop  1 drop Both Eyes BID Wynetta Fines T, MD   1 drop at 12/11/21 0831   heparin injection 5,000 Units  5,000 Units Subcutaneous Q8H Fuller Plan A, MD   5,000 Units at 12/11/21 0643   hydrALAZINE (APRESOLINE) injection 5 mg  5 mg Intravenous Q6H PRN Wynetta Fines T, MD   5 mg at 12/11/21 0645   latanoprost (XALATAN) 0.005 %  ophthalmic solution 1 drop  1 drop Left Eye QHS Wynetta Fines T, MD   1 drop at 12/10/21 2122   linaclotide (LINZESS) capsule 145 mcg  145 mcg Oral QAC breakfast Wynetta Fines T, MD   145 mcg at 12/11/21 0830   ondansetron (ZOFRAN) tablet 4 mg  4 mg Oral Q8H PRN Wynetta Fines T, MD   4 mg at 12/08/21 1052   pantoprazole (PROTONIX) EC tablet 40 mg  40 mg Oral Daily Wynetta Fines T, MD   40 mg at 12/11/21 0831   polyethylene glycol (MIRALAX / GLYCOLAX) packet 17 g  17 g Oral Daily Elodia [place]., MD   17 g at 12/11/21 0830   prednisoLONE acetate (PRED FORTE) 1 % ophthalmic suspension 1 drop  1 drop Both Eyes TID Wynetta Fines T, MD   1 drop at 12/11/21 0832   promethazine  (PHENERGAN) tablet 25 mg  25 mg Oral Q6H PRN Elodia [place]., MD   25 mg at 12/08/21 1710   Or   promethazine (PHENERGAN) 6.25 mg in sodium chloride 0.9 % 50 mL IVPB  6.25 mg Intravenous Q6H PRN Elodia [place]., MD       Or   promethazine (PHENERGAN) suppository 25 mg  25 mg Rectal Q6H PRN Elodia [place]., MD       sodium chloride (OCEAN) 0.65 % nasal spray 1 spray  1 spray Each Nare PRN Elodia [place]., MD       sodium chloride flush (NS) 0.9 % injection 3 mL  3 mL Intravenous Q12H Fuller Plan A, MD   3 mL at 12/11/21 0835      Review of Systems: 10 systems reviewed and negative except per interval history/subjective  Physical Exam: Vitals:   12/11/21 0546 12/11/21 0645  BP: (!) 161/104 (!) 155/106  Pulse: 73 87  Resp: 18   Temp: (!) 97.5 F (36.4 C)   SpO2: 97% 100%   No intake/output data recorded.  Intake/Output Summary (Last 24 hours) at 12/11/2021 0932 Last data filed at 12/10/2021 2130 Gross per 24 hour  Intake 520 ml  Output --  Net 520 ml    Constitutional: well-appearing, no acute distress, obese ENMT: ears and nose without scars or lesions, MMM CV: normal rate, trace edema in ankles Respiratory: bilateral chest rise, normal work of breathing Gastrointestinal: soft, non-tender, no palpable masses or hernias Skin: no visible lesions or rashes Psych: alert, judgement/insight appropriate, appropriate mood and affect   Test Results I personally reviewed new and old clinical labs and radiology tests Lab Results  Component Value Date   NA 133 (L) 12/11/2021   K 3.8 12/11/2021   CL 98 12/11/2021   CO2 24 12/11/2021   BUN 46 (H) 12/11/2021   CREATININE 6.15 (H) 12/11/2021   GFR 9.86 (LL) 10/17/2021   GLU 85 11/05/2021   CALCIUM 9.0 12/11/2021   ALBUMIN 3.4 (L) 12/11/2021   PHOS 3.6 12/11/2021    CBC Recent Labs  Lab 12/08/21 0722 12/09/21 1108 12/10/21 0754  WBC 7.9 8.1 7.9  HGB 11.0* 10.8* 10.9*  HCT 34.9* 35.3*  36.3*  MCV 77.6* 78.6* 79.8*  PLT 192 154 145*

## 2021-12-12 DIAGNOSIS — I132 Hypertensive heart and chronic kidney disease with heart failure and with stage 5 chronic kidney disease, or end stage renal disease: Secondary | ICD-10-CM | POA: Insufficient documentation

## 2021-12-12 DIAGNOSIS — N186 End stage renal disease: Secondary | ICD-10-CM | POA: Insufficient documentation

## 2021-12-12 DIAGNOSIS — D631 Anemia in chronic kidney disease: Secondary | ICD-10-CM

## 2021-12-12 DIAGNOSIS — N189 Chronic kidney disease, unspecified: Secondary | ICD-10-CM

## 2021-12-12 HISTORY — DX: Chronic kidney disease, unspecified: D63.1

## 2021-12-12 HISTORY — DX: Anemia in chronic kidney disease: N18.9

## 2021-12-12 LAB — RENAL FUNCTION PANEL
Albumin: 3.2 g/dL — ABNORMAL LOW (ref 3.5–5.0)
Anion gap: 14 (ref 5–15)
BUN: 60 mg/dL — ABNORMAL HIGH (ref 6–20)
CO2: 19 mmol/L — ABNORMAL LOW (ref 22–32)
Calcium: 9.2 mg/dL (ref 8.9–10.3)
Chloride: 101 mmol/L (ref 98–111)
Creatinine, Ser: 6.97 mg/dL — ABNORMAL HIGH (ref 0.61–1.24)
GFR, Estimated: 9 mL/min — ABNORMAL LOW (ref >60)
Glucose, Bld: 113 mg/dL — ABNORMAL HIGH (ref 70–99)
Phosphorus: 3.6 mg/dL (ref 2.5–4.6)
Potassium: 3.6 mmol/L (ref 3.5–5.1)
Sodium: 134 mmol/L — ABNORMAL LOW (ref 135–145)

## 2021-12-12 MED ORDER — CARVEDILOL 25 MG PO TABS: 12.5000 mg | ORAL_TABLET | Freq: Two times a day (BID) | ORAL | 0 refills | Status: DC

## 2021-12-12 MED ORDER — CARVEDILOL 12.5 MG PO TABS: 12.5000 mg | ORAL_TABLET | Freq: Two times a day (BID) | ORAL | Status: DC

## 2021-12-12 MED ORDER — HYDRALAZINE HCL 20 MG/ML IJ SOLN: 10.0000 mg | INTRAMUSCULAR | Status: DC | PRN

## 2021-12-12 MED ORDER — ALLOPURINOL 300 MG PO TABS: 150.0000 mg | ORAL_TABLET | ORAL | 0 refills | Status: DC | PRN

## 2021-12-12 MED ORDER — HYDRALAZINE HCL 20 MG/ML IJ SOLN
10.0000 mg | Freq: Once | INTRAMUSCULAR | Status: AC
  Administered 2021-12-12: 10 mg via INTRAVENOUS
  Filled 2021-12-12: qty 1

## 2021-12-12 NOTE — Progress Notes (Signed)
New Dialysis Start   Patient identified as new dialysis start. Kidney Education packet assembled and given. Discussed the following items with patient:    Current medications and possible changes once started:  Discussed that patient's medications may change over time.  Ex; hypertension medications and diabetes medication.  Nephrologists will adjust as needed.  Fluid restrictions reviewed:  32 oz daily goal:  All liquids count; soups, ice, jello   Phosphorus and potassium: Handout given showing high potassium and phosphorus foods.  Alternative food and drink options given.  Family support:  no family present  Outpatient Clinic Resources:  Discussed roles of Outpatient clinic  staff and advised to make a list of needs, if any, to talk with outpatient staff if needed  Care plan schedule: Informed patient  of Care Plans in outpatient setting and to participate in the care plan.  An invitation would be given from outpatient clinic.   Dialysis Access Options:  Reviewed access options with patients. Discussed in detail about care at home with new AVG & AVF. Reviewed checking bruit and thrill. If dialysis catheter present, educated that patient could not take showers.  Catheter dressing changes were to be done by outpatient clinic staff only  Home therapy options:  Educated patient about home therapy options:  PD vs home hemo.  Patient is going to TCU.  He is interested in PD.   Patient verbalized understanding. Will continue to round on patient during admission.    Lilia Argue, RN

## 2021-12-12 NOTE — Progress Notes (Signed)
BP elevated and pt complains of headache. Pt has PRN IV hydralazine Q6 PRN based on ordered parameters. Pt is concerned that he has not been receiving his home dose PO Hydralazine which he takes TID. Per MD note home BP meds being held while pt being initiated on HD pr Nephrology recommendation. Explained this to patient and will have MD discuss with patient in am.   12/12/21 0042  Vitals  BP (!) 159/107  MAP (mmHg) 125  BP Location Right Arm  BP Method Automatic  Patient Position (if appropriate) Sitting  Pulse Rate 90  Pulse Rate Source Monitor  ECG Heart Rate 92  Resp 16  MEWS COLOR  MEWS Score Color Green  Oxygen Therapy  O2 Device Room Air  MEWS Score  MEWS Temp 0  MEWS Systolic 0  MEWS Pulse 0  MEWS RR 0  MEWS LOC 0  MEWS Score 0

## 2021-12-12 NOTE — Progress Notes (Signed)
TRH night cross cover note:   I was notified by RN of the patient's current BP of 171/105 following dose of prn IV hydralazine 5 mg. HR's in low 90's.   Per my chart review, including review of nephrology/TRH notes, patient here with aki on ckd 5, started on HD on 11/23. per chart review, plan form a hypertension standpoint includes holding home blood pressure medications in the context of this new initiation of hemodialysis. Per this plan, I'll continue to hold home BP meds, and modify prn IV antihypertensive intervention, as below:  Hydralazine 10 mg IV x1 now.  Changed the existing order for prn IV hydralazine to 10 mg IV every 4 hours as needed for systolic blood pressure greater than 347 or diastolic blood pressure greater than 110 mmHg.   Next HD session anticipated to occur on Tues, 11/28.     Babs Bertin, DO Hospitalist

## 2021-12-12 NOTE — Progress Notes (Signed)
Repeat BP after PRN hydralazine 169/110 map 124, 171/105 map 122. Provider on call Dr. Velia Meyer notified via [place]. Will continue to monitor. Jessie Foot, RN

## 2021-12-12 NOTE — Discharge Summary (Signed)
Physician Discharge Summary   Patient: Frank Coffey MRN: [id number] DOB: [birth year]  Admit date:     12/06/2021  Discharge date: 12/12/21  Discharge Physician: [name]   PCP: Flossie Buffy, NP   Recommendations at discharge:  Continue newly started hemodialysis beginning 11/28. Follow up with nephrology to discuss peritoneal dialysis.  Monitor blood pressure and titrate medications as needed. Consider coronary ischemic work up after discharge.  Discharge Diagnoses: Principal Problem:   Hyperkalemia Active Problems:   Acute kidney injury superimposed on chronic kidney disease (HCC)   Elevated troponin   Chest pain   Essential hypertension   Combined systolic and diastolic cardiac dysfunction   Anemia due to chronic kidney disease   Hyperlipidemia   Irritable bowel syndrome with constipation   Morbid obesity with BMI of 50.0-59.9, adult [facility])  Hospital Course: Frank Coffey is a 41 y.o. 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 [place] placed 11/23 and HD initiated 11/23, 11/24, 11/25. CLIP process initiated and he has been accepted to outpatient dialysis to begin 11/28. Initially hypotensive, BP has started to rise so coreg is restarted at a lower dose of 12.36m po BID and BP will need to be monitored and managed after discharge. Please see details below.  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  Labs revealed creatinine elevated up to 11.82 with BUN 116.  His creatinine had been 6.4 on 10/2.  He is still able to make urine.  Patient symptoms thought to likely be associated with uremia with worsening kidney function.   - s/p TDC by IR on 11/23, initiated HD 11/23, 11/24, 11/25 next 11/28 will be as  outpatient. Ok to discharge from nephrology perspective.   OHS, OSA, nocturnal hypoxemia:  - Restart with BiPAP after discharge.    Nausea, vomiting: Most likely due to uremia. Resolved.    Atypical chest pain: Resolved.  - Consider ischemic work up as outpatient after DC.    Elevated troponin: Mild, only 24 > 35 in the setting of ESRD and no anginal-sounding chest pain. No ischemic ECG features.  - Consider ischemic work up as outpatient after DC.    Combined systolic and diastolic CHF Euvolemic.  Patient does not appear grossly fluid overloaded on physical exam.  Last echocardiogram revealed EF of 35 to 40% with grade 1 diastolic dysfunction when last checked 11/08/2021. - Fluid now management with dialysis - Due to hypotension, GDMT was decreased/stopped. Restarting coreg on day of discharge, would restart ARB next if BP is stable. Holding hydralazine/imdur as well as spironolactone and torsemide at this time as discussed with nephrology.   Anemia of chronic kidney disease: - Monitor intermittently.   Hyperlipidemia: - Continue atorvastatin   NAGMA: Due to ESRD.    Hyperkalemia: Treat with dialysis. Resolved. Dietary education provided.   Essential hypertension - Nephrology recommended holding blood pressure medications while initiating dialysis, will restart coreg as above and titrate as outpatient.   Irritable bowel syndrome with constipation: - Continue linzess, abd benign, having BMs.   Gout:  - Continue allopurinol (chronic med) adjusted 3057mdaily > 15045mdialysis.    Morbid obesity: Body mass index is 54.54 kg/m.  - Weight loss counseling provided.    Consultants: Nephrology  Procedures performed: HD, TDC insertion as above  Disposition: Home Diet recommendation:  Discharge Diet Orders (From admission, onward)     Start     Ordered   12/12/21 0000  Diet - low sodium heart healthy       Comments: restrict to 1,500 ml fluid per day   12/12/21 1331           1,500cc fluid restriction DISCHARGE MEDICATION: Allergies as of 12/12/2021   No Known Allergies      Medication List     STOP taking these medications    benzonatate 100 MG capsule Commonly known as: TESSALON   hydrALAZINE 100 MG tablet Commonly known as: APRESOLINE   isosorbide mononitrate 60 MG 24 hr tablet Commonly known as: IMDUR   olmesartan 40 MG tablet Commonly known as: BENICAR   Potassium Chloride ER 20 MEQ Tbcr   predniSONE 20 MG tablet Commonly known as: DELTASONE   spironolactone 25 MG tablet Commonly known as: ALDACTONE   torsemide 20 MG tablet Commonly known as: DEMADEX       TAKE these medications    albuterol 108 (90 Base) MCG/ACT inhaler Commonly known as: VENTOLIN HFA Inhale 1-2 puffs into the lungs every 6 (six) hours as needed for wheezing (cough).   allopurinol 300 MG tablet Commonly known as: ZYLOPRIM Take 0.5 tablets (150 mg total) by mouth every dialysis (after dialysis). What changed: See the new instructions.   atorvastatin 20 MG tablet Commonly known as: LIPITOR Take 1 tablet (20 mg total) by mouth daily.   brimonidine 0.2 % ophthalmic solution Commonly known as: ALPHAGAN Place 1 drop into both eyes in the morning and at bedtime.   carvedilol 25 MG tablet Commonly known as: COREG Take 0.5 tablets (12.5 mg total) by mouth 2 (two) times daily with a meal. TAKE 2 TABLETS(50 MG) BY MOUTH TWICE DAILY What changed:  how much to take how to take this when to take this   dorzolamide-timolol 2-0.5 % ophthalmic solution Commonly known as: COSOPT Place 1 drop into both eyes in the morning and at bedtime.   EQ FIBER SUPPLEMENT PO Take 2 tablets by mouth daily.   latanoprost 0.005 % ophthalmic solution Commonly known as: XALATAN Place 1 drop into the left eye at bedtime.   Linzess 145 MCG Caps capsule Generic drug: linaclotide TAKE 1 CAPSULE(145 MCG) BY MOUTH DAILY BEFORE BREAKFAST. GIVEN**   omeprazole 40 MG  capsule Commonly known as: PRILOSEC Take 1 capsule (40 mg total) by mouth daily.   ondansetron 4 MG tablet Commonly known as: Zofran Take 1 tablet (4 mg total) by mouth every 8 (eight) hours as needed for nausea or vomiting.   prednisoLONE acetate 1 % ophthalmic suspension Commonly known as: PRED FORTE 3 (three) times daily.   Vitamin D (Ergocalciferol) 1.25 MG (50000 UNIT) Caps capsule Commonly known as: DRISDOL Take 50,000 Units by mouth every Wednesday.        [facility] Follow up.   Why: Transitional Care Unit-Schedule will be Monday/Tuesday/Thursday/Friday with 12:00 chair time.  For first treatment, please arrive at 11:30 to complete paperwork prior to treatment. Contact information: [place] [address] [phone]         Flossie Buffy, NP Follow up.   Specialty: Internal Medicine Contact information: [place] [address] [phone]         Mauricia Area, MD Follow up.   Specialty: Nephrology Contact information: [phone]  [place] [address] [phone]                Discharge Exam: Filed Weights   12/10/21 1125 12/11/21 0546 12/12/21 0610  Weight: (!) 162.3 kg (!) 162.7 kg (!) 162.6 kg  BP (!) 149/104 (BP Location: Left Arm)   Pulse 83   Temp 98.2 F (36.8 C) (Oral)   Resp 16   Ht _0  (1.727 m)   Wt (!) 162.6 kg   SpO2 90%   BMI 54.50 kg/m   No distress, well-appearing obese male Clear, nonlabored RRR, trace pitting woody edema.  Alert, oriented, no focal deficits TDC site at R chest is c/d/I without erythema, mildly tender to palpation. No fluctuance.  Condition at discharge: stable  The results of significant diagnostics from this hospitalization (including imaging, microbiology, ancillary and laboratory) are listed below for reference.   Imaging Studies: IR Fluoro Guide CV Line Right  Result Date: 12/07/2021 INDICATION:  41 year old male with end-stage renal disease requiring initiation of hemodialysis presenting for central venous catheter. EXAM: TUNNELED CENTRAL VENOUS HEMODIALYSIS CATHETER PLACEMENT WITH ULTRASOUND AND FLUOROSCOPIC GUIDANCE MEDICATIONS: Ancef 2 gm IV . The antibiotic was given in an appropriate time interval prior to skin puncture. ANESTHESIA/SEDATION: Moderate (conscious) sedation was employed during this procedure. A total of Versed 0 mg and Fentanyl 100 mcg was administered intravenously. Moderate Sedation Time: 18 minutes. The patient's level of consciousness and vital signs were monitored continuously by radiology nursing throughout the procedure under my direct supervision. FLUOROSCOPY TIME:  03.8 mGy COMPLICATIONS: None immediate. PROCEDURE: Informed written consent was obtained from the patient after a discussion of the risks, benefits, and alternatives to treatment. Questions regarding the procedure were encouraged and answered. The right neck and chest were prepped with chlorhexidine in a sterile fashion, and a sterile drape was applied covering the operative field. Maximum barrier sterile technique with sterile gowns and gloves were used for the procedure. A timeout was performed prior to the initiation of the procedure. After creating a small venotomy incision, a 21 gauge micropuncture kit was utilized to access the internal jugular vein. Real-time ultrasound guidance was utilized for vascular access including the acquisition of a permanent ultrasound image documenting patency of the accessed vessel. A Rosen wire was advanced to the level of the IVC and the micropuncture sheath was exchanged for an 8 Fr dilator. A 14.5 French tunneled hemodialysis catheter measuring 23 cm from tip to cuff was tunneled in a retrograde fashion from the anterior chest wall to the venotomy incision. Serial dilation was then performed an a peel-away sheath was placed. The catheter was then placed through the peel-away  sheath with the catheter tip ultimately positioned within the right atrium. Final catheter positioning was confirmed and documented with a spot radiographic image. The catheter aspirates and flushes normally. The catheter was flushed with appropriate volume heparin dwells. The catheter exit site was secured with a 0-Silk retention suture. The venotomy incision was closed with Dermabond. Sterile dressings were applied. The patient tolerated the procedure well without immediate post procedural complication. IMPRESSION: Successful placement of 23 cm tip to cuff tunneled hemodialysis catheter via the right internal jugular vein with catheter tip terminating within the right atrium. The catheter is ready for immediate use. Ruthann Cancer, MD [facility] Radiology Electronically Signed   By: Ruthann Cancer M.D.   On: 12/07/2021 19:52   IR US Guide Vasc Access Right  Result Date: 12/07/2021 INDICATION: 41 year old male with end-stage  renal disease requiring initiation of hemodialysis presenting for central venous catheter. EXAM: TUNNELED CENTRAL VENOUS HEMODIALYSIS CATHETER PLACEMENT WITH ULTRASOUND AND FLUOROSCOPIC GUIDANCE MEDICATIONS: Ancef 2 gm IV . The antibiotic was given in an appropriate time interval prior to skin puncture. ANESTHESIA/SEDATION: Moderate (conscious) sedation was employed during this procedure. A total of Versed 0 mg and Fentanyl 100 mcg was administered intravenously. Moderate Sedation Time: 18 minutes. The patient's level of consciousness and vital signs were monitored continuously by radiology nursing throughout the procedure under my direct supervision. FLUOROSCOPY TIME:  21.9 mGy COMPLICATIONS: None immediate. PROCEDURE: Informed written consent was obtained from the patient after a discussion of the risks, benefits, and alternatives to treatment. Questions regarding the procedure were encouraged and answered. The right neck and chest were  prepped with chlorhexidine in a sterile fashion, and a sterile drape was applied covering the operative field. Maximum barrier sterile technique with sterile gowns and gloves were used for the procedure. A timeout was performed prior to the initiation of the procedure. After creating a small venotomy incision, a 21 gauge micropuncture kit was utilized to access the internal jugular vein. Real-time ultrasound guidance was utilized for vascular access including the acquisition of a permanent ultrasound image documenting patency of the accessed vessel. A Rosen wire was advanced to the level of the IVC and the micropuncture sheath was exchanged for an 8 Fr dilator. A 14.5 French tunneled hemodialysis catheter measuring 23 cm from tip to cuff was tunneled in a retrograde fashion from the anterior chest wall to the venotomy incision. Serial dilation was then performed an a peel-away sheath was placed. The catheter was then placed through the peel-away sheath with the catheter tip ultimately positioned within the right atrium. Final catheter positioning was confirmed and documented with a spot radiographic image. The catheter aspirates and flushes normally. The catheter was flushed with appropriate volume heparin dwells. The catheter exit site was secured with a 0-Silk retention suture. The venotomy incision was closed with Dermabond. Sterile dressings were applied. The patient tolerated the procedure well without immediate post procedural complication. IMPRESSION: Successful placement of 23 cm tip to cuff tunneled hemodialysis catheter via the right internal jugular vein with catheter tip terminating within the right atrium. The catheter is ready for immediate use. Ruthann Cancer, MD [facility] Radiology Electronically Signed   By: Ruthann Cancer M.D.   On: 12/07/2021 19:52   US Renal  Result Date: 12/06/2021 CLINICAL DATA:  Renal failure. EXAM: RENAL / URINARY TRACT  ULTRASOUND COMPLETE COMPARISON:  Abdominal ultrasound dated July 03, 2014. FINDINGS: Right Kidney: Renal measurements: 10.0 x 4.5 x 4.3 cm = volume: 102 mL. Progressive increased echogenicity. No mass or hydronephrosis visualized. Left Kidney: Renal measurements: 10.5 x 5.1 x 5.3 cm = volume: 149 mL. Progressive increased echogenicity. No mass or hydronephrosis visualized. Bladder: Appears normal for degree of bladder distention. Other: None. IMPRESSION: 1. No acute abnormality. 2. Progressive increased echogenicity of the bilateral kidneys, consistent with medical renal disease. Electronically Signed   By: Titus Dubin M.D.   On: 12/06/2021 18:56   DG Chest 2 View  Result Date: 12/06/2021 CLINICAL DATA:  Chest pain. EXAM: CHEST - 2 VIEW COMPARISON:  August 10, 2020. FINDINGS: The heart size and mediastinal contours are within normal limits. Both lungs are clear. The visualized skeletal structures are unremarkable. IMPRESSION: No active cardiopulmonary disease. Electronically Signed   By: Marijo Conception M.D.   On: 12/06/2021 12:23    Microbiology: Results  for orders placed or performed during the hospital encounter of 07/04/20  Group A Strep by PCR     Status: None   Collection Time: 07/04/20 12:39 PM   Specimen: Throat; Sterile Swab  Result Value Ref Range Status   Group A Strep by PCR NOT DETECTED NOT DETECTED Final    Comment: Performed at [facility], [address] [place], [place] [address]  Resp Panel by RT-PCR (Flu A&B, Covid) Nasopharyngeal Swab     Status: None   Collection Time: 07/04/20  2:00 PM   Specimen: Nasopharyngeal Swab; Nasopharyngeal(NP) swabs in vial transport medium  Result Value Ref Range Status   SARS Coronavirus 2 by RT PCR NEGATIVE NEGATIVE Final    Comment: (NOTE) SARS-CoV-2 target nucleic acids are NOT DETECTED.  The SARS-CoV-2 RNA is generally detectable in upper respiratory specimens during the acute phase of infection. The lowest concentration  of SARS-CoV-2 viral copies this assay can detect is 138 copies/mL. A negative result does not preclude SARS-Cov-2 infection and should not be used as the sole basis for treatment or other patient management decisions. A negative result may occur with  improper specimen collection/handling, submission of specimen other than nasopharyngeal swab, presence of viral mutation(s) within the areas targeted by this assay, and inadequate number of viral copies(<138 copies/mL). A negative result must be combined with clinical observations, patient history, and epidemiological information. The expected result is Negative.  Fact Sheet for Patients:  EntrepreneurPulse.com.au  Fact Sheet for Healthcare Providers:  IncredibleEmployment.be  This test is no t yet approved or cleared by the [place] FDA and  has been authorized for detection and/or diagnosis of SARS-CoV-2 by FDA under an Emergency Use Authorization (EUA). This EUA will remain  in effect (meaning this test can be used) for the duration of the COVID-19 declaration under Section 564(b)(1) of the Act, 21 U.S.C.section 360bbb-3(b)(1), unless the authorization is terminated  or revoked sooner.       Influenza A by PCR NEGATIVE NEGATIVE Final   Influenza B by PCR NEGATIVE NEGATIVE Final    Comment: (NOTE) The Xpert Xpress SARS-CoV-2/FLU/RSV plus assay is intended as an aid in the diagnosis of influenza from Nasopharyngeal swab specimens and should not be used as a sole basis for treatment. Nasal washings and aspirates are unacceptable for Xpert Xpress SARS-CoV-2/FLU/RSV testing.  Fact Sheet for Patients: EntrepreneurPulse.com.au  Fact Sheet for Healthcare Providers: IncredibleEmployment.be  This test is not yet approved or cleared by the [place] FDA and has been authorized for detection and/or diagnosis of SARS-CoV-2 by FDA under an Emergency Use  Authorization (EUA). This EUA will remain in effect (meaning this test can be used) for the duration of the COVID-19 declaration under Section 564(b)(1) of the Act, 21 U.S.C. section 360bbb-3(b)(1), unless the authorization is terminated or revoked.  Performed at [facility], [address] [place], [place] [address]     Labs: CBC: Recent Labs  Lab 12/06/21 1205 12/08/21 0722 12/09/21 1108 12/10/21 0754  WBC 8.9 7.9 8.1 7.9  HGB 11.4* 11.0* 10.8* 10.9*  HCT 38.0* 34.9* 35.3* 36.3*  MCV 78.7* 77.6* 78.6* 79.8*  PLT 220 192 154 144*   Basic Metabolic Panel: Recent Labs  Lab 12/06/21 1354 12/06/21 1635 12/08/21 0722 12/09/21 0132 12/10/21 0755 12/11/21 0205 12/12/21 0232  NA  --    < > 136 136 135 133* 134*  K  --    < > 4.9 5.7* 4.2 3.8 3.6  CL  --    < >  104 101 100 98 101  CO2  --    < > 22 21* 27 24 19*  GLUCOSE  --    < > 98 97 100* 99 113*  BUN  --    < > 82* 88* 61* 46* 60*  CREATININE  --    < > 8.94* 9.15* 7.31* 6.15* 6.97*  CALCIUM  --    < > 8.5* 8.7* 8.8* 9.0 9.2  MG 2.3  --   --   --   --   --   --   PHOS 6.7*  --  6.6* 6.6* 5.4* 3.6 3.6   < > = values in this interval not displayed.   Liver Function Tests: Recent Labs  Lab 12/06/21 1205 12/08/21 0722 12/09/21 0132 12/10/21 0755 12/11/21 0205 12/12/21 0232  AST 25  --   --   --   --   --   ALT 31  --   --   --   --   --   ALKPHOS 72  --   --   --   --   --   BILITOT 0.7  --   --   --   --   --   PROT 8.7*  --   --   --   --   --   ALBUMIN 3.5 3.2* 3.1* 3.2* 3.4* 3.2*   CBG: Recent Labs  Lab 12/06/21 1446 12/06/21 1537 12/07/21 2058  GLUCAP 87 138* 178*    Discharge time spent: greater than 30 minutes.  Signed: [name], MD [facility] 12/12/2021

## 2021-12-12 NOTE — Progress Notes (Signed)
Nephrology Follow-Up Consult note   Assessment/Recommendations: Frank Coffey is a/an 41 y.o. 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  IR placed [place] on 11/23 with first HD session on 11/23.   - Planning to do PD outpatient thus have held off on AVF creation.   - outpatient HD has been arranged for the TCU for a Monday-Tuesday-Thursday-Friday schedule - will need to assess if hernia and if he is a surgical candidate for PD (I.e. need for hernia repair).  He thinks PD will work much better with his schedule and preferences   # Volume/ hypertension: BP has been labile per charting.  Optimize volume with HD.  Restart carvedilol (home dose was 50 mg BID per charting and off of this completely here).  Try carvedilol 12.5 mg BID and can add back other meds outpatient if needed (would add back olmesartan reduced dose next)   #Hyperkalemia: improved with holding outpatient meds and HD.  Would not resume potassium or spironolactone at this time   # Anemia of Chronic Kidney Disease: Hemoglobin 10.9.  hasn't had ESA here yet - would start outpatient   #CKD-BMD: phos acceptable. PTH 166. Defer activated vit D for now   #Gout: some concern for flare. Mgmt per primary  Disposition - stable for discharge today from a renal standpoint   ___________________________________________________________   Interval History/Subjective:  no urine output data is available for the past several days. HD SW reached out that the patient was accepted to the TCU at [facility] (for a Mon, Tues, Thurs, Friday schedule).   Patient is hoping to do PD and states he has been discussing with his outpatient nephrologist, Dr. Posey Pronto.  He does think he has been told of an abdominal hernia.  States he keeps up with mychart and we discussed some questions about his labs.  Doesn't really  like getting stuck so would prefer PD to home hemo  Review of systems:  Denies n/v Denies shortness of breath or chest pain   Medications:  Current Facility-Administered Medications  Medication Dose Route Frequency Provider Last Rate Last Admin   acetaminophen (TYLENOL) tablet 650 mg  650 mg Oral Q6H PRN Fuller Plan A, MD   650 mg at 12/12/21 0019   Or   acetaminophen (TYLENOL) suppository 650 mg  650 mg Rectal Q6H PRN Fuller Plan A, MD       albuterol (PROVENTIL) (2.5 MG/3ML) 0.083% nebulizer solution 3 mL  3 mL Inhalation Q6H PRN Wynetta Fines T, MD       allopurinol (ZYLOPRIM) tablet 150 mg  150 mg Oral Q M,W,F-HD Elodia Florence., MD       atorvastatin (LIPITOR) tablet 20 mg  20 mg Oral Daily Wynetta Fines T, MD   20 mg at 12/12/21 1014   benzonatate (TESSALON) capsule 100 mg  100 mg Oral TID PRN Wynetta Fines T, MD       brimonidine (ALPHAGAN) 0.2 % ophthalmic solution 1 drop  1 drop Both Eyes Q8H Wynetta Fines T, MD   1 drop at 12/12/21 0643   Chlorhexidine Gluconate Cloth 2 % PADS 6 each  6 each Topical Q0600 Reesa Chew, MD   6 each at 12/11/21 0550   diclofenac Sodium (VOLTAREN) 1 % topical gel 2 g  2 g Topical QID PRN Florene Glen, A  Clint Lipps., MD       dorzolamide-timolol (COSOPT) 2-0.5 % ophthalmic solution 1 drop  1 drop Both Eyes BID Wynetta Fines T, MD   1 drop at 12/12/21 1023   heparin injection 5,000 Units  5,000 Units Subcutaneous Q8H Smith, Rondell A, MD   5,000 Units at 12/12/21 0643   hydrALAZINE (APRESOLINE) injection 10 mg  10 mg Intravenous Q4H PRN Howerter, Justin B, DO       latanoprost (XALATAN) 0.005 % ophthalmic solution 1 drop  1 drop Left Eye QHS Wynetta Fines T, MD   1 drop at 12/11/21 2139   linaclotide (LINZESS) capsule 145 mcg  145 mcg Oral QAC breakfast Wynetta Fines T, MD   145 mcg at 12/12/21 1016   ondansetron (ZOFRAN) tablet 4 mg  4 mg Oral Q8H PRN Wynetta Fines T, MD   4 mg at 12/08/21 1052   pantoprazole (PROTONIX) EC tablet 40 mg  40 mg Oral Daily  Wynetta Fines T, MD   40 mg at 12/12/21 1015   polyethylene glycol (MIRALAX / GLYCOLAX) packet 17 g  17 g Oral Daily Elodia Florence., MD   17 g at 12/11/21 0830   prednisoLONE acetate (PRED FORTE) 1 % ophthalmic suspension 1 drop  1 drop Both Eyes TID Wynetta Fines T, MD   1 drop at 12/12/21 1019   promethazine (PHENERGAN) tablet 25 mg  25 mg Oral Q6H PRN Elodia Florence., MD   25 mg at 12/08/21 1710   Or   promethazine (PHENERGAN) 6.25 mg in sodium chloride 0.9 % 50 mL IVPB  6.25 mg Intravenous Q6H PRN Elodia Florence., MD       Or   promethazine (PHENERGAN) suppository 25 mg  25 mg Rectal Q6H PRN Elodia Florence., MD       senna-docusate (Senokot-S) tablet 1 tablet  1 tablet Oral Daily [name], MD   1 tablet at 12/12/21 1015   sodium chloride (OCEAN) 0.65 % nasal spray 1 spray  1 spray Each Nare PRN Elodia Florence., MD       sodium chloride flush (NS) 0.9 % injection 3 mL  3 mL Intravenous Q12H Fuller Plan A, MD   3 mL at 12/12/21 1024      Physical Exam: Vitals:   12/12/21 0610 12/12/21 0846  BP: (!) 151/102 (!) 149/104  Pulse: 94 83  Resp: 18 16  Temp: 98.1 F (36.7 C) 98.2 F (36.8 C)  SpO2: 95% 90%   No intake/output data recorded.  Intake/Output Summary (Last 24 hours) at 12/12/2021 1248 Last data filed at 12/11/2021 1600 Gross per 24 hour  Intake 237 ml  Output --  Net 237 ml    General adult male in bed in no acute distress.  Working on Teaching laboratory technician on arrival. Two [employer].  HEENT normocephalic atraumatic extraocular movements intact sclera anicteric Neck supple trachea midline Lungs clear to auscultation bilaterally normal work of breathing at rest  Heart regular rate and rhythm no rubs or gallops appreciated Abdomen soft nontender nondistended Extremities no edema  Psych normal mood and affect Neuro - alert and oriented x 3 provides hx and follows commands Access RIJ tunn catheter in place    Test Results I personally  reviewed new and old clinical labs and radiology tests Lab Results  Component Value Date   NA 134 (L) 12/12/2021   K 3.6 12/12/2021   CL 101 12/12/2021   CO2 19 (L)  12/12/2021   BUN 60 (H) 12/12/2021   CREATININE 6.97 (H) 12/12/2021   GFR 9.86 (LL) 10/17/2021   GLU 85 11/05/2021   CALCIUM 9.2 12/12/2021   ALBUMIN 3.2 (L) 12/12/2021   PHOS 3.6 12/12/2021    CBC Recent Labs  Lab 12/08/21 0722 12/09/21 1108 12/10/21 0754  WBC 7.9 8.1 7.9  HGB 11.0* 10.8* 10.9*  HCT 34.9* 35.3* 36.3*  MCV 77.6* 78.6* 79.8*  PLT 192 154 145*    Claudia Desanctis, MD 1:36 PM 12/12/2021

## 2021-12-12 NOTE — Progress Notes (Signed)
Mobility Specialist - Progress Note   12/12/21 1331  Mobility  Activity Ambulated with assistance in hallway  Level of Assistance Standby assist, set-up cues, supervision of patient - no hands on  Assistive Device None  Distance Ambulated (ft) 300 ft  Activity Response Tolerated well  Mobility Referral Yes  $Mobility charge 1 Mobility   Pt was received in bed and agreeable to mobility. No complaints throughout. Pt was returned to bed with all needs met.   Franki Monte  Mobility Specialist Please contact via Solicitor or Rehab office at [phone]

## 2021-12-12 NOTE — Progress Notes (Addendum)
Pt has been accepted at Transitional Care Unit at [facility]. Pt's schedule will be Mon/Tues/Thurs/Fri with 12:00 chair time. Pt can start tomorrow and will need to arrive at 11:30 to complete paperwork prior to treatment. Met with pt at bedside to discuss above details. Pt provided schedule letter and agreeable to plan. Update provided to treatment team via secure chat. Arrangements added to pt's AVS as well.   Melven Sartorius Renal Navigator [phone]  Addendum at 2:41 pm:  Pt to d/c to home today. Contacted clinic to make them aware that pt will d/c today and start at [facility] tomorrow. Renal PA to send orders to clinic for tomorrow.

## 2021-12-13 ENCOUNTER — Telehealth: Payer: Self-pay | Admitting: Nephrology

## 2021-12-13 ENCOUNTER — Telehealth: Payer: Self-pay

## 2021-12-13 NOTE — Telephone Encounter (Signed)
Transition Care Management Follow-up Telephone Call Date of discharge and from where: [place] 12-12-21 Dx: hyperkalemia How have you been since you were released from the hospital? Doing ok  Any questions or concerns? No  Items Reviewed: Did the pt receive and understand the discharge instructions provided? Yes  Medications obtained and verified? Yes  Other? No  Any new allergies since your discharge? No  Dietary orders reviewed? Yes Do you have support at home? Yes   Home Care and Equipment/Supplies: Were home health services ordered? yes If so, what is the name of the agency? Unsure of name   Has the agency set up a time to come to the patient's home? no Were any new equipment or medical supplies ordered?  No What is the name of the medical supply agency? na Were you able to get the supplies/equipment? not applicable Do you have any questions related to the use of the equipment or supplies? No  Functional Questionnaire: (I = Independent and D = Dependent) ADLs: I  Bathing/Dressing- I  Meal Prep- I  Eating- I  Maintaining continence- I  Transferring/Ambulation- I  Managing Meds- I  Follow up appointments reviewed:  PCP Hospital f/u appt confirmed? Yes  Scheduled to see [name] NP   on 12-23-21 @ [facility] f/u appt confirmed? Yes  Scheduled to see nephrology at dialysis  Are transportation arrangements needed? No  If their condition worsens, is the pt aware to call PCP or go to the Emergency Dept.? Yes Was the patient provided with contact information for the PCP's office or ED? Yes Was to pt encouraged to call back with questions or concerns? Yes   Frank Crumble LPN [place] Direct Dial [phone]

## 2021-12-13 NOTE — Telephone Encounter (Signed)
Transition of Care Contact from [place]  Date of Discharge: 12/12/21 Date of Contact: 12/13/21 Method of contact: phone - attempted  Attempted to contact patient to discuss transition of care from inpatient admission.  Patient did not answer the phone.  Will attempt to call them again and if unable to reach will follow up at dialysis.  Jen Mow, PA-C [employer]

## 2021-12-23 ENCOUNTER — Telehealth: Payer: Self-pay

## 2021-12-23 ENCOUNTER — Telehealth: Payer: Self-pay | Admitting: Nurse Practitioner

## 2021-12-23 ENCOUNTER — Encounter: Payer: Self-pay | Admitting: Nurse Practitioner

## 2021-12-23 ENCOUNTER — Inpatient Hospital Stay: Payer: 59 | Admitting: Nurse Practitioner

## 2021-12-23 NOTE — Telephone Encounter (Signed)
[place] office - please call pt to reschedule hospital f/u ASAP.  2nd no show w/in 12 months, fee generated, final warning sent via mail and mychart

## 2021-12-23 NOTE — Telephone Encounter (Signed)
Pt was a no show for a hosp f/up on 12/23/21 with [name], I sent a letter.

## 2021-12-23 NOTE — Telephone Encounter (Signed)
I spoke with pt, he is in dialysis today. He can only come in a Wednesday.

## 2021-12-23 NOTE — Telephone Encounter (Signed)
Frank Coffey with [facility] CM called asking what the plans were after the pt's 11/7 office visit with regard to PD cath placement.  Reviewed pt's chart, returned call for clarification, no answer, lf vm stating that Dr Mora Appl last office note states PD cath placement will be scheduled when nephrology deems appropriate. There are no notes from nephrology at this time.

## 2021-12-23 NOTE — Telephone Encounter (Signed)
Cancelled visit - no fee applied

## 2021-12-30 DIAGNOSIS — N2581 Secondary hyperparathyroidism of renal origin: Secondary | ICD-10-CM | POA: Insufficient documentation

## 2022-01-03 ENCOUNTER — Other Ambulatory Visit: Payer: Self-pay | Admitting: *Deleted

## 2022-01-03 DIAGNOSIS — N185 Chronic kidney disease, stage 5: Secondary | ICD-10-CM

## 2022-01-09 NOTE — Progress Notes (Deleted)
[facility] [place]  ASSESSMENT / PLAN: Frank Coffey is a 41 y.o. *** handed male in need of permanent dialysis access. I reviewed options for dialysis in detail with the patient, including hemodialysis and peritoneal dialysis. I counseled the patient to ask their nephrologist about their candidacy for renal transplant. I counseled the patient that dialysis access requires surveillance and periodic maintenance. Plan to proceed with ***.    CHIEF COMPLAINT: ***  HISTORY OF PRESENT ILLNESS: Frank Coffey is a 41 y.o. male ***  VASCULAR SURGICAL HISTORY: ***  VASCULAR RISK FACTORS: {FINDINGS; POSITIVE [phone]} history of stroke / transient ischemic attack. {FINDINGS; POSITIVE [phone]} history of coronary artery disease. *** history of PCI. *** history of CABG.  {FINDINGS; POSITIVE [phone]} history of diabetes mellitus. Last A1c ***. {FINDINGS; POSITIVE [phone]} history of smoking. *** actively smoking. {FINDINGS; POSITIVE [phone]} history of hypertension. *** drug regimen with *** control. {FINDINGS; POSITIVE [phone]} history of chronic kidney disease.  Last GFR ***. CKD {stage:30421363}. {FINDINGS; POSITIVE [phone]} history of chronic obstructive pulmonary disease, treated with ***.  FUNCTIONAL STATUS: ECOG performance status: {findings; ecog performance status:31780} Ambulatory status: {TNHAmbulation:25868}  CAREY 1 AND 3 YEAR INDEX Male (2pts) 75-79 or 80-84 (2pts) >84 (3pts) Dependence in toileting (1pt) Partial or full dependence in dressing (1pt) History of malignant neoplasm (2pts) CHF (3pts) COPD (1pts) CKD (3pts)  0-3 pts 6% 1 year mortality ; 21% 3 year mortality 4-5 pts 12% 1 year mortality ; 36% 3 year mortality >5 pts 21% 1 year mortality; 54% 3 year mortality   Past Medical History:  Diagnosis Date   CHF (congestive heart failure) (HCC)    Chronic  constipation    Chronic kidney disease, stage 3, mod decreased GFR (HCC)    Followed by [facility]   Constipation    Depression    "when I had Covid"   Gout    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   History of kidney stones    Passed   Hx of migraine headaches    Hyperlipidemia    Hypertension    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)    IBS (irritable bowel syndrome)    Obesity    OSA (obstructive sleep apnea)    Pneumonia 04/2019   with Covid   Sleep apnea     Past Surgical History:  Procedure Laterality Date   BIOPSY  02/09/2020   Procedure: BIOPSY;  Surgeon: Irving Copas., MD;  Location: [facility] ENDOSCOPY;  Service: Gastroenterology;;   CHOLECYSTECTOMY     COLONOSCOPY WITH PROPOFOL N/A 02/09/2020   Procedure: COLONOSCOPY WITH PROPOFOL;  Surgeon: Irving Copas., MD;  Location: [facility] ENDOSCOPY;  Service: Gastroenterology;  Laterality: N/A;   ESOPHAGOGASTRODUODENOSCOPY (EGD) WITH PROPOFOL N/A 02/09/2020   Procedure: ESOPHAGOGASTRODUODENOSCOPY (EGD) WITH PROPOFOL;  Surgeon: Rush Landmark Telford Nab., MD;  Location: [place];  Service: Gastroenterology;  Laterality: N/A;   IR FLUORO GUIDE CV LINE RIGHT  12/07/2021   IR US GUIDE VASC ACCESS RIGHT  12/07/2021   POLYPECTOMY  02/09/2020   Procedure: POLYPECTOMY;  Surgeon: Mansouraty, Telford Nab., MD;  Location: [facility] ENDOSCOPY;  Service: Gastroenterology;;   RETINAL DETACHMENT REPAIR W/ SCLERAL BUCKLE LE     Left    Family History  Problem Relation Age of Onset   Hypertension Mother    Hyperlipidemia Mother    Heart disease Mother    Kidney disease Mother    Depression Mother    Stomach cancer Father  Healthy Sister        x2   Healthy Brother        x2   Hypertension Maternal Grandmother    Stroke Maternal Grandmother    Heart disease Maternal Grandmother    Hyperlipidemia Maternal Grandmother    Stroke Maternal Grandfather    Hypertension Maternal Grandfather     Heart disease Maternal Grandfather    Hyperlipidemia Maternal Grandfather    Alzheimer's disease Maternal Grandfather    Cancer - Other Paternal Grandmother    Healthy Daughter        x1   Healthy Son        x2   Diabetes Neg Hx    Heart attack Neg Hx    Sudden death Neg Hx    Colon cancer Neg Hx    Esophageal cancer Neg Hx    Pancreatic cancer Neg Hx    Inflammatory bowel disease Neg Hx    Liver disease Neg Hx    Rectal cancer Neg Hx     Social History   Socioeconomic History   Marital status: Divorced    Spouse name: Not on file   Number of children: Not on file   Years of education: Not on file   Highest education level: Not on file  Occupational History   Not on file  Tobacco Use   Smoking status: Never   Smokeless tobacco: Never  Vaping Use   Vaping Use: Never used  Substance and Sexual Activity   Alcohol use: Not Currently    Comment: occ   Drug use: No   Sexual activity: Not on file  Other Topics Concern   Not on file  Social History Narrative   Epworth Sleepiness Scale = 10 (as of 12/01/14)   Social Determinants of Health   Financial Resource Strain: Not on file  Food Insecurity: Not on file  Transportation Needs: Not on file  Physical Activity: Not on file  Stress: Not on file  Social Connections: Not on file  Intimate Partner Violence: Not on file    No Known Allergies  Current Outpatient Medications  Medication Sig Dispense Refill   albuterol (VENTOLIN HFA) 108 (90 Base) MCG/ACT inhaler Inhale 1-2 puffs into the lungs every 6 (six) hours as needed for wheezing (cough). 8 g 2   allopurinol (ZYLOPRIM) 300 MG tablet Take 0.5 tablets (150 mg total) by mouth every dialysis (after dialysis). 15 tablet 0   atorvastatin (LIPITOR) 20 MG tablet Take 1 tablet (20 mg total) by mouth daily. 90 tablet 3   brimonidine (ALPHAGAN) 0.2 % ophthalmic solution Place 1 drop into both eyes in the morning and at bedtime.     carvedilol (COREG) 25 MG tablet Take 0.5  tablets (12.5 mg total) by mouth 2 (two) times daily with a meal. TAKE 2 TABLETS(50 MG) BY MOUTH TWICE DAILY 30 tablet 0   dorzolamide-timolol (COSOPT) 22.3-6.8 MG/ML ophthalmic solution Place 1 drop into both eyes in the morning and at bedtime.     EQ FIBER SUPPLEMENT PO Take 2 tablets by mouth daily.     latanoprost (XALATAN) 0.005 % ophthalmic solution Place 1 drop into the left eye at bedtime.     LINZESS 145 MCG CAPS capsule TAKE 1 CAPSULE(145 MCG) BY MOUTH DAILY BEFORE BREAKFAST. GIVEN** 90 capsule 3   omeprazole (PRILOSEC) 40 MG capsule Take 1 capsule (40 mg total) by mouth daily. 30 capsule 6   ondansetron (ZOFRAN) 4 MG tablet Take 1 tablet (4 mg total) by  mouth every 8 (eight) hours as needed for nausea or vomiting. 20 tablet 0   prednisoLONE acetate (PRED FORTE) 1 % ophthalmic suspension 3 (three) times daily.     Vitamin D, Ergocalciferol, (DRISDOL) 1.25 MG (50000 UNIT) CAPS capsule Take 50,000 Units by mouth every Wednesday.     No current facility-administered medications for this visit.    PHYSICAL EXAM There were no vitals filed for this visit.  Constitutional: *** appearing. *** distress. Appears *** nourished.  Neurologic: CN ***. *** focal findings. *** sensory loss. Psychiatric: *** Mood and affect symmetric and appropriate. Eyes: *** No icterus. No conjunctival pallor. Ears, nose, throat: *** mucous membranes moist. Midline trachea.  Cardiac: *** rate and rhythm.  Respiratory: *** unlabored. Abdominal: *** soft, non-tender, non-distended.  Peripheral vascular: *** Extremity: *** edema. *** cyanosis. *** pallor.  Skin: *** gangrene. *** ulceration.  Lymphatic: *** Stemmer's sign. *** palpable lymphadenopathy.    PERTINENT LABORATORY AND RADIOLOGIC DATA  Most recent CBC    Latest Ref Rng & Units 12/10/2021    7:54 AM 12/09/2021   11:08 AM 12/08/2021    7:22 AM  CBC  WBC 4.0 - 10.5 K/uL 7.9  8.1  7.9   Hemoglobin 13.0 - 17.0 g/dL 10.9  10.8  11.0    Hematocrit 39.0 - 52.0 % 36.3  35.3  34.9   Platelets 150 - 400 K/uL 145  154  192      Most recent CMP    Latest Ref Rng & Units 12/12/2021    2:32 AM 12/11/2021    2:05 AM 12/10/2021    7:55 AM  CMP  Glucose 70 - 99 mg/dL 113  99  100   BUN 6 - 20 mg/dL 60  46  61   Creatinine 0.61 - 1.24 mg/dL 6.97  6.15  7.31   Sodium 135 - 145 mmol/L 134  133  135   Potassium 3.5 - 5.1 mmol/L 3.6  3.8  4.2   Chloride 98 - 111 mmol/L 101  98  100   CO2 22 - 32 mmol/L '19  24  27   '$ Calcium 8.9 - 10.3 mg/dL 9.2  9.0  8.8     Renal function CrCl cannot be calculated (Patient's most recent lab result is older than the maximum 21 days allowed.).  Hemoglobin A1C (no units)  Date Value  06/15/2021 12.0   Hgb A1c MFr Bld (%)  Date Value  07/12/2021 5.9 (H)    LDL Chol Calc (NIH)  Date Value Ref Range Status  07/12/2021 123 (H) 0 - 99 mg/dL Final     Vascular Imaging: ***  Alexandra Posadas N. Stanford Breed, MD FACS [facility] Phone Number: [phone] 01/09/2022 6:30 PM   Total time spent on preparing this encounter including chart review, data review, collecting history, examining the patient, coordinating care for this {tnhtimebilling:26202}  Portions of this report may have been transcribed using voice recognition software.  Every effort has been made to ensure accuracy; however, inadvertent computerized transcription errors may still be present.

## 2022-01-10 ENCOUNTER — Ambulatory Visit: Payer: 59 | Admitting: Vascular Surgery

## 2022-01-10 ENCOUNTER — Ambulatory Visit (HOSPITAL_COMMUNITY): Payer: 59

## 2022-01-13 ENCOUNTER — Ambulatory Visit (INDEPENDENT_AMBULATORY_CARE_PROVIDER_SITE_OTHER)
Admission: RE | Admit: 2022-01-13 | Discharge: 2022-01-13 | Disposition: A | Discharge status: Home / Self Care | Payer: 59 | Source: Ambulatory Visit | Attending: Vascular Surgery | Admitting: Vascular Surgery

## 2022-01-13 ENCOUNTER — Ambulatory Visit (HOSPITAL_COMMUNITY)
Admission: RE | Admit: 2022-01-13 | Discharge: 2022-01-13 | Disposition: A | Discharge status: Home / Self Care | Payer: 59 | Source: Ambulatory Visit | Attending: Vascular Surgery | Admitting: Vascular Surgery

## 2022-01-13 DIAGNOSIS — N185 Chronic kidney disease, stage 5: Secondary | ICD-10-CM | POA: Diagnosis present

## 2022-01-18 ENCOUNTER — Other Ambulatory Visit: Payer: Self-pay

## 2022-01-18 ENCOUNTER — Encounter: Payer: Self-pay | Admitting: Vascular Surgery

## 2022-01-18 ENCOUNTER — Ambulatory Visit (INDEPENDENT_AMBULATORY_CARE_PROVIDER_SITE_OTHER): Payer: 59 | Admitting: Vascular Surgery

## 2022-01-18 VITALS — BP 116/83 | HR 94 | Temp 98.0°F | Resp 20 | Ht 68.0 in | Wt 342.0 lb

## 2022-01-18 DIAGNOSIS — N186 End stage renal disease: Secondary | ICD-10-CM

## 2022-01-18 NOTE — H&P (View-Only) (Signed)
Patient ID: Frank Coffey, male   DOB: 04-17-1980, 42 y.o.   MRN: [id number]  Reason for Consult: Follow-up   Referred by Rexene Agent, MD  Subjective:     HPI:  Frank Coffey is a 42 y.o. male left-hand-dominant male recently started dialysis via right IJ tunneled dialysis catheter.  Not on any blood thinners.  Dialyzes Tuesdays, Thursdays and Saturdays.  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  Had considered peritoneal dialysis now considering long-term chemo.  Is also attempting to lose weight for consideration of transplant.  Does not take any blood thinners.  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)    IBS (irritable bowel syndrome)    Obesity    OSA (obstructive sleep apnea)    Pneumonia 04/2019   with Covid   Sleep apnea    Family History  Problem Relation Age of Onset   Hypertension Mother    Hyperlipidemia Mother    Heart disease Mother    Kidney disease Mother    Depression Mother    Stomach cancer Father    Healthy Sister        x2   Healthy Brother        x2   Hypertension Maternal Grandmother    Stroke Maternal Grandmother    Heart disease Maternal Grandmother    Hyperlipidemia Maternal Grandmother    Stroke Maternal Grandfather    Hypertension Maternal Grandfather    Heart disease Maternal Grandfather    Hyperlipidemia Maternal Grandfather    Alzheimer's disease Maternal Grandfather    Cancer - Other Paternal Grandmother    Healthy Daughter        x1   Healthy Son         x2   Diabetes Neg Hx    Heart attack Neg Hx    Sudden death Neg Hx    Colon cancer Neg Hx    Esophageal cancer Neg Hx    Pancreatic cancer Neg Hx    Inflammatory bowel disease Neg Hx    Liver disease Neg Hx    Rectal cancer Neg Hx    Past Surgical History:  Procedure Laterality Date   BIOPSY  02/09/2020   Procedure: BIOPSY;  Surgeon: Rush Landmark, Telford Nab., MD;  Location: [facility] ENDOSCOPY;  Service: Gastroenterology;;   CHOLECYSTECTOMY     COLONOSCOPY WITH PROPOFOL N/A 02/09/2020   Procedure: COLONOSCOPY WITH PROPOFOL;  Surgeon: Irving Copas., MD;  Location: [facility] ENDOSCOPY;  Service: Gastroenterology;  Laterality: N/A;   ESOPHAGOGASTRODUODENOSCOPY (EGD) WITH PROPOFOL N/A 02/09/2020   Procedure: ESOPHAGOGASTRODUODENOSCOPY (EGD) WITH PROPOFOL;  Surgeon: Rush Landmark Telford Nab., MD;  Location: [place];  Service: Gastroenterology;  Laterality: N/A;   IR FLUORO GUIDE CV LINE RIGHT  12/07/2021   IR US GUIDE VASC ACCESS RIGHT  12/07/2021   POLYPECTOMY  02/09/2020   Procedure: POLYPECTOMY;  Surgeon: Mansouraty, Telford Nab., MD;  Location: [place];  Service: Gastroenterology;;   RETINAL DETACHMENT REPAIR W/ SCLERAL BUCKLE LE     Left    Short Social History:  Social History   Tobacco Use   Smoking status: Never   Smokeless tobacco: Never  Substance Use Topics   Alcohol use: Not Currently    Comment: occ    No Known Allergies  Current Outpatient Medications  Medication Sig Dispense Refill   albuterol (VENTOLIN HFA) 108 (90 Base) MCG/ACT inhaler Inhale 1-2 puffs into the lungs every 6 (six) hours as needed for wheezing (cough). 8 g 2   allopurinol (ZYLOPRIM) 300 MG tablet Take 0.5 tablets (150 mg total) by mouth every dialysis (after dialysis). 15 tablet 0   atorvastatin (LIPITOR) 20 MG tablet Take 1 tablet (20 mg total) by mouth daily. 90 tablet 3   brimonidine (ALPHAGAN) 0.2 % ophthalmic solution Place 1 drop into both eyes in the morning and at bedtime.      carvedilol (COREG) 25 MG tablet Take 0.5 tablets (12.5 mg total) by mouth 2 (two) times daily with a meal. TAKE 2 TABLETS(50 MG) BY MOUTH TWICE DAILY 30 tablet 0   dorzolamide-timolol (COSOPT) 22.3-6.8 MG/ML ophthalmic solution Place 1 drop into both eyes in the morning and at bedtime.     EQ FIBER SUPPLEMENT PO Take 2 tablets by mouth daily.     latanoprost (XALATAN) 0.005 % ophthalmic solution Place 1 drop into the left eye at bedtime.     LINZESS 145 MCG CAPS capsule TAKE 1 CAPSULE(145 MCG) BY MOUTH DAILY BEFORE BREAKFAST. GIVEN** 90 capsule 3   ondansetron (ZOFRAN) 4 MG tablet Take 1 tablet (4 mg total) by mouth every 8 (eight) hours as needed for nausea or vomiting. 20 tablet 0   prednisoLONE acetate (PRED FORTE) 1 % ophthalmic suspension 3 (three) times daily.     Vitamin D, Ergocalciferol, (DRISDOL) 1.25 MG (50000 UNIT) CAPS capsule Take 50,000 Units by mouth every Wednesday.     omeprazole (PRILOSEC) 40 MG capsule Take 1 capsule (40 mg total) by mouth daily. 30 capsule 6   No current facility-administered medications for this visit.    Review of Systems  Constitutional:  Constitutional negative. HENT: HENT negative.  Eyes: Eyes negative.  Respiratory: Respiratory negative.  Cardiovascular: Cardiovascular negative.  GI: Gastrointestinal negative.  Musculoskeletal: Musculoskeletal negative.  Neurological: Neurological negative. Hematologic: Hematologic/lymphatic negative.  Psychiatric: Psychiatric negative.        Objective:  Objective   Vitals:   01/18/22 0854  BP: 116/83  Pulse: 94  Resp: 20  Temp: 98 F (36.7 C)  SpO2: 92%  Weight: (!) 342 lb (155.1 kg)  Height: '5\' 8"'$  (1.727 m)   Body mass index is 52 kg/m.  Physical Exam Constitutional:      Appearance: He is obese.  HENT:     Head: Normocephalic.     Nose: Nose normal.  Eyes:     Pupils: Pupils are equal, round, and reactive to light.  Cardiovascular:     Rate and Rhythm: Normal rate.     Pulses:           Radial pulses are 2+ on the right side and 2+ on the left side.  Abdominal:     General: Abdomen is flat.     Palpations: Abdomen is soft.  Musculoskeletal:        General: Normal range of motion.     Comments: No visible upper extremity surface veins  Neurological:     General: No focal deficit present.  Mental Status: He is alert.  Psychiatric:        Mood and Affect: Mood normal.        Behavior: Behavior normal.        Thought Content: Thought content normal.        Judgment: Judgment normal.     Data: Right Pre-Dialysis Findings:  +-----------------------+----------+--------------------+---------+--------  +  Location              PSV (cm/s)Intralum. Diam. (cm)Waveform  Comments  +-----------------------+----------+--------------------+---------+--------  +  Brachial Antecub. fossa62        0.52                triphasic           +-----------------------+----------+--------------------+---------+--------  +  Radial Art at Wrist    54        0.25                triphasic           +-----------------------+----------+--------------------+---------+--------  +  Ulnar Art at Wrist     49        0.23                triphasic           +-----------------------+----------+--------------------+---------+--------  +       Left Pre-Dialysis Findings:  +-----------------------+----------+--------------------+---------+--------  ---+  Location              PSV (cm/s)Intralum. Diam. (cm)Waveform  Comments     +-----------------------+----------+--------------------+---------+--------  ---+  Brachial Antecub. fossa65        0.47                triphasic              +-----------------------+----------+--------------------+---------+--------  ---+  Radial Art at Wrist    64        0.28                triphasicEDV 10  cm/s  +-----------------------+----------+--------------------+---------+--------  ---+  Ulnar Art at  Wrist     64        0.25                triphasicEDV 12  cm/s  +-----------------------+----------+--------------------+---------+--------  ---+        Summary:    Right: Patent brachial, radial, and ulnar arteries.  Left: Patent brachial, radial, and ulnar arteries. Slight increase        in radial and ulnar end diastolic velocities.  *See table(s) above for measurements and observations.   Right Cephalic   Diameter (cm)Depth (cm)  Findings     +-----------------+-------------+----------+-------------+  Shoulder            0.71                              +-----------------+-------------+----------+-------------+  Prox upper arm       0.66                              +-----------------+-------------+----------+-------------+  Mid upper arm        0.61                              +-----------------+-------------+----------+-------------+  Dist upper arm       0.72  branch 0.6 cm  +-----------------+-------------+----------+-------------+  Antecubital fossa    0.77                              +-----------------+-------------+----------+-------------+  Prox forearm         0.42                              +-----------------+-------------+----------+-------------+  Mid forearm          0.45                              +-----------------+-------------+----------+-------------+  Dist forearm         0.47                              +-----------------+-------------+----------+-------------+   +-----------------+-------------+----------+--------+  Right Basilic    Diameter (cm)Depth (cm)Findings  +-----------------+-------------+----------+--------+  Mid upper arm        0.52                         +-----------------+-------------+----------+--------+  Dist upper arm       0.59                         +-----------------+-------------+----------+--------+  Antecubital fossa    0.49                          +-----------------+-------------+----------+--------+   +-----------------+-------------+----------+--------+  Left Cephalic    Diameter (cm)Depth (cm)Findings  +-----------------+-------------+----------+--------+  Shoulder            0.59                         +-----------------+-------------+----------+--------+  Prox upper arm       0.55                         +-----------------+-------------+----------+--------+  Mid upper arm        0.68                         +-----------------+-------------+----------+--------+  Dist upper arm       0.67                         +-----------------+-------------+----------+--------+  Antecubital fossa    0.77                         +-----------------+-------------+----------+--------+  Prox forearm         0.43                         +-----------------+-------------+----------+--------+  Mid forearm          0.38                         +-----------------+-------------+----------+--------+  Dist forearm         0.46                         +-----------------+-------------+----------+--------+   +-----------------+-------------+----------+--------+  Left  Basilic     Diameter (cm)Depth (cm)Findings  +-----------------+-------------+----------+--------+  Mid upper arm        0.56                         +-----------------+-------------+----------+--------+  Dist upper arm       0.64                         +-----------------+-------------+----------+--------+  Antecubital fossa    0.46                         +-----------------+-------------+----------+--------+   Summary: Right: Patent cephalic and basilic veins.  Left: Patent cephalic and basilic veins.       Assessment/Plan:    42 year old male with end-stage renal disease on dialysis via catheter.  He is left-hand dominant.  Plan for right arm AV fistula versus graft on a nondialysis day in the near  future.  I discussed the need for possible future procedures including superficialization or balloon assisted procedures as well and need for at least 3 months maturation of the fistula prior to removal of catheter.  Patient demonstrates good understanding we will get him scheduled on a nondialysis day in the near future.     Waynetta Sandy MD [facility]

## 2022-01-18 NOTE — Progress Notes (Signed)
Patient ID: Frank Coffey, male   DOB: 1980-03-11, 42 y.o.   MRN: [id number]  Reason for Consult: Follow-up   Referred by Rexene Agent, MD  Subjective:     HPI:  Frank Coffey is a 42 y.o. male left-hand-dominant male recently started dialysis via right IJ tunneled dialysis catheter.  Not on any blood thinners.  Dialyzes Tuesdays, Thursdays and Saturdays.  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  Had considered peritoneal dialysis now considering long-term chemo.  Is also attempting to lose weight for consideration of transplant.  Does not take any blood thinners.  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)    IBS (irritable bowel syndrome)    Obesity    OSA (obstructive sleep apnea)    Pneumonia 04/2019   with Covid   Sleep apnea    Family History  Problem Relation Age of Onset   Hypertension Mother    Hyperlipidemia Mother    Heart disease Mother    Kidney disease Mother    Depression Mother    Stomach cancer Father    Healthy Sister        x2   Healthy Brother        x2   Hypertension Maternal Grandmother    Stroke Maternal Grandmother    Heart disease Maternal Grandmother    Hyperlipidemia Maternal Grandmother    Stroke Maternal Grandfather    Hypertension Maternal Grandfather    Heart disease Maternal Grandfather    Hyperlipidemia Maternal Grandfather    Alzheimer's disease Maternal Grandfather    Cancer - Other Paternal Grandmother    Healthy Daughter        x1   Healthy Son         x2   Diabetes Neg Hx    Heart attack Neg Hx    Sudden death Neg Hx    Colon cancer Neg Hx    Esophageal cancer Neg Hx    Pancreatic cancer Neg Hx    Inflammatory bowel disease Neg Hx    Liver disease Neg Hx    Rectal cancer Neg Hx    Past Surgical History:  Procedure Laterality Date   BIOPSY  02/09/2020   Procedure: BIOPSY;  Surgeon: Rush Landmark, Telford Nab., MD;  Location: [facility] ENDOSCOPY;  Service: Gastroenterology;;   CHOLECYSTECTOMY     COLONOSCOPY WITH PROPOFOL N/A 02/09/2020   Procedure: COLONOSCOPY WITH PROPOFOL;  Surgeon: Irving Copas., MD;  Location: [facility] ENDOSCOPY;  Service: Gastroenterology;  Laterality: N/A;   ESOPHAGOGASTRODUODENOSCOPY (EGD) WITH PROPOFOL N/A 02/09/2020   Procedure: ESOPHAGOGASTRODUODENOSCOPY (EGD) WITH PROPOFOL;  Surgeon: Rush Landmark Telford Nab., MD;  Location: [place];  Service: Gastroenterology;  Laterality: N/A;   IR FLUORO GUIDE CV LINE RIGHT  12/07/2021   IR US GUIDE VASC ACCESS RIGHT  12/07/2021   POLYPECTOMY  02/09/2020   Procedure: POLYPECTOMY;  Surgeon: Mansouraty, Telford Nab., MD;  Location: [place];  Service: Gastroenterology;;   RETINAL DETACHMENT REPAIR W/ SCLERAL BUCKLE LE     Left    Short Social History:  Social History   Tobacco Use   Smoking status: Never   Smokeless tobacco: Never  Substance Use Topics   Alcohol use: Not Currently    Comment: occ    No Known Allergies  Current Outpatient Medications  Medication Sig Dispense Refill   albuterol (VENTOLIN HFA) 108 (90 Base) MCG/ACT inhaler Inhale 1-2 puffs into the lungs every 6 (six) hours as needed for wheezing (cough). 8 g 2   allopurinol (ZYLOPRIM) 300 MG tablet Take 0.5 tablets (150 mg total) by mouth every dialysis (after dialysis). 15 tablet 0   atorvastatin (LIPITOR) 20 MG tablet Take 1 tablet (20 mg total) by mouth daily. 90 tablet 3   brimonidine (ALPHAGAN) 0.2 % ophthalmic solution Place 1 drop into both eyes in the morning and at bedtime.      carvedilol (COREG) 25 MG tablet Take 0.5 tablets (12.5 mg total) by mouth 2 (two) times daily with a meal. TAKE 2 TABLETS(50 MG) BY MOUTH TWICE DAILY 30 tablet 0   dorzolamide-timolol (COSOPT) 22.3-6.8 MG/ML ophthalmic solution Place 1 drop into both eyes in the morning and at bedtime.     EQ FIBER SUPPLEMENT PO Take 2 tablets by mouth daily.     latanoprost (XALATAN) 0.005 % ophthalmic solution Place 1 drop into the left eye at bedtime.     LINZESS 145 MCG CAPS capsule TAKE 1 CAPSULE(145 MCG) BY MOUTH DAILY BEFORE BREAKFAST. GIVEN** 90 capsule 3   ondansetron (ZOFRAN) 4 MG tablet Take 1 tablet (4 mg total) by mouth every 8 (eight) hours as needed for nausea or vomiting. 20 tablet 0   prednisoLONE acetate (PRED FORTE) 1 % ophthalmic suspension 3 (three) times daily.     Vitamin D, Ergocalciferol, (DRISDOL) 1.25 MG (50000 UNIT) CAPS capsule Take 50,000 Units by mouth every Wednesday.     omeprazole (PRILOSEC) 40 MG capsule Take 1 capsule (40 mg total) by mouth daily. 30 capsule 6   No current facility-administered medications for this visit.    Review of Systems  Constitutional:  Constitutional negative. HENT: HENT negative.  Eyes: Eyes negative.  Respiratory: Respiratory negative.  Cardiovascular: Cardiovascular negative.  GI: Gastrointestinal negative.  Musculoskeletal: Musculoskeletal negative.  Neurological: Neurological negative. Hematologic: Hematologic/lymphatic negative.  Psychiatric: Psychiatric negative.        Objective:  Objective   Vitals:   01/18/22 0854  BP: 116/83  Pulse: 94  Resp: 20  Temp: 98 F (36.7 C)  SpO2: 92%  Weight: (!) 342 lb (155.1 kg)  Height: '5\' 8"'$  (1.727 m)   Body mass index is 52 kg/m.  Physical Exam Constitutional:      Appearance: He is obese.  HENT:     Head: Normocephalic.     Nose: Nose normal.  Eyes:     Pupils: Pupils are equal, round, and reactive to light.  Cardiovascular:     Rate and Rhythm: Normal rate.     Pulses:           Radial pulses are 2+ on the right side and 2+ on the left side.  Abdominal:     General: Abdomen is flat.     Palpations: Abdomen is soft.  Musculoskeletal:        General: Normal range of motion.     Comments: No visible upper extremity surface veins  Neurological:     General: No focal deficit present.  Mental Status: He is alert.  Psychiatric:        Mood and Affect: Mood normal.        Behavior: Behavior normal.        Thought Content: Thought content normal.        Judgment: Judgment normal.     Data: Right Pre-Dialysis Findings:  +-----------------------+----------+--------------------+---------+--------  +  Location              PSV (cm/s)Intralum. Diam. (cm)Waveform  Comments  +-----------------------+----------+--------------------+---------+--------  +  Brachial Antecub. fossa62        0.52                triphasic           +-----------------------+----------+--------------------+---------+--------  +  Radial Art at Wrist    54        0.25                triphasic           +-----------------------+----------+--------------------+---------+--------  +  Ulnar Art at Wrist     49        0.23                triphasic           +-----------------------+----------+--------------------+---------+--------  +       Left Pre-Dialysis Findings:  +-----------------------+----------+--------------------+---------+--------  ---+  Location              PSV (cm/s)Intralum. Diam. (cm)Waveform  Comments     +-----------------------+----------+--------------------+---------+--------  ---+  Brachial Antecub. fossa65        0.47                triphasic              +-----------------------+----------+--------------------+---------+--------  ---+  Radial Art at Wrist    64        0.28                triphasicEDV 10  cm/s  +-----------------------+----------+--------------------+---------+--------  ---+  Ulnar Art at  Wrist     64        0.25                triphasicEDV 12  cm/s  +-----------------------+----------+--------------------+---------+--------  ---+        Summary:    Right: Patent brachial, radial, and ulnar arteries.  Left: Patent brachial, radial, and ulnar arteries. Slight increase        in radial and ulnar end diastolic velocities.  *See table(s) above for measurements and observations.   Right Cephalic   Diameter (cm)Depth (cm)  Findings     +-----------------+-------------+----------+-------------+  Shoulder            0.71                              +-----------------+-------------+----------+-------------+  Prox upper arm       0.66                              +-----------------+-------------+----------+-------------+  Mid upper arm        0.61                              +-----------------+-------------+----------+-------------+  Dist upper arm       0.72  branch 0.6 cm  +-----------------+-------------+----------+-------------+  Antecubital fossa    0.77                              +-----------------+-------------+----------+-------------+  Prox forearm         0.42                              +-----------------+-------------+----------+-------------+  Mid forearm          0.45                              +-----------------+-------------+----------+-------------+  Dist forearm         0.47                              +-----------------+-------------+----------+-------------+   +-----------------+-------------+----------+--------+  Right Basilic    Diameter (cm)Depth (cm)Findings  +-----------------+-------------+----------+--------+  Mid upper arm        0.52                         +-----------------+-------------+----------+--------+  Dist upper arm       0.59                         +-----------------+-------------+----------+--------+  Antecubital fossa    0.49                          +-----------------+-------------+----------+--------+   +-----------------+-------------+----------+--------+  Left Cephalic    Diameter (cm)Depth (cm)Findings  +-----------------+-------------+----------+--------+  Shoulder            0.59                         +-----------------+-------------+----------+--------+  Prox upper arm       0.55                         +-----------------+-------------+----------+--------+  Mid upper arm        0.68                         +-----------------+-------------+----------+--------+  Dist upper arm       0.67                         +-----------------+-------------+----------+--------+  Antecubital fossa    0.77                         +-----------------+-------------+----------+--------+  Prox forearm         0.43                         +-----------------+-------------+----------+--------+  Mid forearm          0.38                         +-----------------+-------------+----------+--------+  Dist forearm         0.46                         +-----------------+-------------+----------+--------+   +-----------------+-------------+----------+--------+  Left  Basilic     Diameter (cm)Depth (cm)Findings  +-----------------+-------------+----------+--------+  Mid upper arm        0.56                         +-----------------+-------------+----------+--------+  Dist upper arm       0.64                         +-----------------+-------------+----------+--------+  Antecubital fossa    0.46                         +-----------------+-------------+----------+--------+   Summary: Right: Patent cephalic and basilic veins.  Left: Patent cephalic and basilic veins.       Assessment/Plan:    42 year old male with end-stage renal disease on dialysis via catheter.  He is left-hand dominant.  Plan for right arm AV fistula versus graft on a nondialysis day in the near  future.  I discussed the need for possible future procedures including superficialization or balloon assisted procedures as well and need for at least 3 months maturation of the fistula prior to removal of catheter.  Patient demonstrates good understanding we will get him scheduled on a nondialysis day in the near future.     Waynetta Sandy MD [facility]

## 2022-01-20 ENCOUNTER — Ambulatory Visit: Payer: 59 | Admitting: Vascular Surgery

## 2022-02-16 ENCOUNTER — Encounter (HOSPITAL_COMMUNITY): Payer: Self-pay | Admitting: Vascular Surgery

## 2022-02-16 ENCOUNTER — Other Ambulatory Visit: Payer: Self-pay

## 2022-02-16 NOTE — Progress Notes (Signed)
Mr. Flatt denies chest pain or shortness of breath.  Patient denies having any s/s of Covid in his household, also denies any known exposure to Covid.   Mr. Encinas PCP is Marlowe Kays, NP. Cardiologist is Dr. Sumner Boast, patient saw cardiologist in 2022 and did not see him again until 10/23.  Echo on 11/08/21 had improved since the one done on 02/18/20.  Mr, Doolin has been followed by [facility] with kidney disease for years, at least since 2015.  Patient has CHF also.  Mr. Cislo started dialysis in the fall of 2023- patient is dialyzing 3 days a week. Patient reports that he has lost over 50 lbs in the past few months.

## 2022-02-17 ENCOUNTER — Telehealth: Payer: Self-pay | Admitting: Physician Assistant

## 2022-02-17 ENCOUNTER — Encounter (HOSPITAL_COMMUNITY): Payer: Self-pay | Admitting: Vascular Surgery

## 2022-02-17 ENCOUNTER — Ambulatory Visit (HOSPITAL_COMMUNITY): Payer: Managed Care, Other (non HMO) | Admitting: Certified Registered Nurse Anesthetist

## 2022-02-17 ENCOUNTER — Ambulatory Visit (HOSPITAL_BASED_OUTPATIENT_CLINIC_OR_DEPARTMENT_OTHER): Payer: Managed Care, Other (non HMO) | Admitting: Certified Registered Nurse Anesthetist

## 2022-02-17 ENCOUNTER — Ambulatory Visit (HOSPITAL_COMMUNITY)
Admission: RE | Admit: 2022-02-17 | Discharge: 2022-02-17 | Disposition: A | Discharge status: Home / Self Care | Payer: Managed Care, Other (non HMO) | Attending: Vascular Surgery | Admitting: Vascular Surgery

## 2022-02-17 ENCOUNTER — Other Ambulatory Visit: Payer: Self-pay

## 2022-02-17 ENCOUNTER — Encounter (HOSPITAL_COMMUNITY)
Admission: RE | Disposition: A | Discharge status: Home / Self Care | Payer: Self-pay | Source: Home / Self Care | Attending: Vascular Surgery

## 2022-02-17 DIAGNOSIS — Z992 Dependence on renal dialysis: Secondary | ICD-10-CM | POA: Diagnosis not present

## 2022-02-17 DIAGNOSIS — Z79899 Other long term (current) drug therapy: Secondary | ICD-10-CM | POA: Insufficient documentation

## 2022-02-17 DIAGNOSIS — I132 Hypertensive heart and chronic kidney disease with heart failure and with stage 5 chronic kidney disease, or end stage renal disease: Secondary | ICD-10-CM | POA: Insufficient documentation

## 2022-02-17 DIAGNOSIS — Z8673 Personal history of transient ischemic attack (TIA), and cerebral infarction without residual deficits: Secondary | ICD-10-CM | POA: Diagnosis not present

## 2022-02-17 DIAGNOSIS — E1122 Type 2 diabetes mellitus with diabetic chronic kidney disease: Secondary | ICD-10-CM | POA: Insufficient documentation

## 2022-02-17 DIAGNOSIS — N185 Chronic kidney disease, stage 5: Secondary | ICD-10-CM

## 2022-02-17 DIAGNOSIS — K219 Gastro-esophageal reflux disease without esophagitis: Secondary | ICD-10-CM | POA: Diagnosis not present

## 2022-02-17 DIAGNOSIS — I509 Heart failure, unspecified: Secondary | ICD-10-CM

## 2022-02-17 DIAGNOSIS — G4730 Sleep apnea, unspecified: Secondary | ICD-10-CM

## 2022-02-17 DIAGNOSIS — N186 End stage renal disease: Secondary | ICD-10-CM | POA: Insufficient documentation

## 2022-02-17 DIAGNOSIS — Z6841 Body Mass Index (BMI) 40.0 and over, adult: Secondary | ICD-10-CM | POA: Insufficient documentation

## 2022-02-17 DIAGNOSIS — G4733 Obstructive sleep apnea (adult) (pediatric): Secondary | ICD-10-CM | POA: Diagnosis not present

## 2022-02-17 HISTORY — PX: AV FISTULA PLACEMENT: SHX1204

## 2022-02-17 HISTORY — DX: Headache, unspecified: R51.9

## 2022-02-17 LAB — POCT I-STAT, CHEM 8
BUN: 35 mg/dL — ABNORMAL HIGH (ref 6–20)
Calcium, Ion: 1.03 mmol/L — ABNORMAL LOW (ref 1.15–1.40)
Chloride: 97 mmol/L — ABNORMAL LOW (ref 98–111)
Creatinine, Ser: 11.8 mg/dL — ABNORMAL HIGH (ref 0.61–1.24)
Glucose, Bld: 94 mg/dL (ref 70–99)
HCT: 38 % — ABNORMAL LOW (ref 39.0–52.0)
Hemoglobin: 12.9 g/dL — ABNORMAL LOW (ref 13.0–17.0)
Potassium: 3.7 mmol/L (ref 3.5–5.1)
Sodium: 139 mmol/L (ref 135–145)
TCO2: 29 mmol/L (ref 22–32)

## 2022-02-17 SURGERY — ARTERIOVENOUS (AV) FISTULA CREATION
Anesthesia: Regional | Site: Arm Lower | Laterality: Right

## 2022-02-17 MED ORDER — FENTANYL CITRATE (PF) 250 MCG/5ML IJ SOLN
INTRAMUSCULAR | Status: DC | PRN
  Administered 2022-02-17 (×2): 50 ug via INTRAVENOUS

## 2022-02-17 MED ORDER — ONDANSETRON HCL 4 MG/2ML IJ SOLN
INTRAMUSCULAR | Status: AC
  Filled 2022-02-17: qty 2

## 2022-02-17 MED ORDER — ACETAMINOPHEN 500 MG PO TABS: 1000.0000 mg | ORAL_TABLET | Freq: Once | ORAL | Status: DC

## 2022-02-17 MED ORDER — HEPARIN 6000 UNIT IRRIGATION SOLUTION
Status: DC | PRN
  Administered 2022-02-17: 1

## 2022-02-17 MED ORDER — ORAL CARE MOUTH RINSE: 15.0000 mL | Freq: Once | OROMUCOSAL | Status: DC

## 2022-02-17 MED ORDER — FENTANYL CITRATE (PF) 100 MCG/2ML IJ SOLN: 25.0000 ug | INTRAMUSCULAR | Status: DC | PRN

## 2022-02-17 MED ORDER — CEFAZOLIN IN SODIUM CHLORIDE 3-0.9 GM/100ML-% IV SOLN
INTRAVENOUS | Status: AC
  Filled 2022-02-17: qty 100

## 2022-02-17 MED ORDER — PROMETHAZINE HCL 25 MG/ML IJ SOLN: 6.2500 mg | INTRAMUSCULAR | Status: DC | PRN

## 2022-02-17 MED ORDER — CEFAZOLIN IN SODIUM CHLORIDE 3-0.9 GM/100ML-% IV SOLN
3.0000 g | INTRAVENOUS | Status: AC
  Administered 2022-02-17: 3 g via INTRAVENOUS

## 2022-02-17 MED ORDER — CHLORHEXIDINE GLUCONATE 4 % EX LIQD: 60.0000 mL | Freq: Once | CUTANEOUS | Status: DC

## 2022-02-17 MED ORDER — CHLORHEXIDINE GLUCONATE 0.12 % MT SOLN: 15.0000 mL | Freq: Once | OROMUCOSAL | Status: DC

## 2022-02-17 MED ORDER — SODIUM CHLORIDE 0.9 % IV SOLN: INTRAVENOUS | Status: DC

## 2022-02-17 MED ORDER — PROPOFOL 500 MG/50ML IV EMUL
INTRAVENOUS | Status: DC | PRN
  Administered 2022-02-17: 10 ug/kg/min via INTRAVENOUS

## 2022-02-17 MED ORDER — OXYCODONE-ACETAMINOPHEN 5-325 MG PO TABS: 1.0000 | ORAL_TABLET | Freq: Four times a day (QID) | ORAL | 0 refills | Status: DC | PRN

## 2022-02-17 MED ORDER — MIDAZOLAM HCL 2 MG/2ML IJ SOLN
INTRAMUSCULAR | Status: DC | PRN
  Administered 2022-02-17 (×2): 1 mg via INTRAVENOUS

## 2022-02-17 MED ORDER — FENTANYL CITRATE (PF) 250 MCG/5ML IJ SOLN
INTRAMUSCULAR | Status: AC
  Filled 2022-02-17: qty 5

## 2022-02-17 MED ORDER — LIDOCAINE-EPINEPHRINE (PF) 1.5 %-1:200000 IJ SOLN
INTRAMUSCULAR | Status: DC | PRN
  Administered 2022-02-17: 30 mL via PERINEURAL

## 2022-02-17 MED ORDER — DEXAMETHASONE SODIUM PHOSPHATE 10 MG/ML IJ SOLN
INTRAMUSCULAR | Status: DC | PRN
  Administered 2022-02-17: 5 mg

## 2022-02-17 MED ORDER — HEPARIN 6000 UNIT IRRIGATION SOLUTION
Status: AC
  Filled 2022-02-17: qty 500

## 2022-02-17 MED ORDER — LIDOCAINE 2% (20 MG/ML) 5 ML SYRINGE
INTRAMUSCULAR | Status: AC
  Filled 2022-02-17: qty 5

## 2022-02-17 MED ORDER — AMISULPRIDE (ANTIEMETIC) 5 MG/2ML IV SOLN: 10.0000 mg | Freq: Once | INTRAVENOUS | Status: DC | PRN

## 2022-02-17 MED ORDER — PROPOFOL 10 MG/ML IV BOLUS
INTRAVENOUS | Status: AC
  Filled 2022-02-17: qty 20

## 2022-02-17 MED ORDER — MIDAZOLAM HCL 2 MG/2ML IJ SOLN
INTRAMUSCULAR | Status: AC
  Filled 2022-02-17: qty 2

## 2022-02-17 MED ORDER — 0.9 % SODIUM CHLORIDE (POUR BTL) OPTIME
TOPICAL | Status: DC | PRN
  Administered 2022-02-17: 1000 mL

## 2022-02-17 MED ORDER — LACTATED RINGERS IV SOLN: INTRAVENOUS | Status: DC

## 2022-02-17 MED ORDER — CHLORHEXIDINE GLUCONATE 0.12 % MT SOLN
OROMUCOSAL | Status: AC
  Filled 2022-02-17: qty 15

## 2022-02-17 SURGICAL SUPPLY — 31 items
ADH SKN CLS APL DERMABOND .7 (GAUZE/BANDAGES/DRESSINGS) ×1
ARMBAND PINK RESTRICT EXTREMIT (MISCELLANEOUS) ×1 IMPLANT
BAG COUNTER SPONGE SURGICOUNT (BAG) ×1 IMPLANT
BAG SPNG CNTER NS LX DISP (BAG) ×1
CANISTER SUCT 3000ML PPV (MISCELLANEOUS) ×1 IMPLANT
CLIP LIGATING EXTRA MED SLVR (CLIP) ×1 IMPLANT
CLIP LIGATING EXTRA SM BLUE (MISCELLANEOUS) ×1 IMPLANT
COVER PROBE W GEL 5X96 (DRAPES) IMPLANT
DERMABOND ADVANCED .7 DNX12 (GAUZE/BANDAGES/DRESSINGS) ×1 IMPLANT
ELECT REM PT RETURN 9FT ADLT (ELECTROSURGICAL) ×1
ELECTRODE REM PT RTRN 9FT ADLT (ELECTROSURGICAL) ×1 IMPLANT
GLOVE BIO SURGEON STRL SZ7.5 (GLOVE) ×1 IMPLANT
GOWN STRL REUS W/ TWL LRG LVL3 (GOWN DISPOSABLE) ×2 IMPLANT
GOWN STRL REUS W/ TWL XL LVL3 (GOWN DISPOSABLE) ×1 IMPLANT
GOWN STRL REUS W/TWL LRG LVL3 (GOWN DISPOSABLE) ×1
GOWN STRL REUS W/TWL XL LVL3 (GOWN DISPOSABLE) ×2
INSERT FOGARTY SM (MISCELLANEOUS) IMPLANT
KIT BASIN OR (CUSTOM PROCEDURE TRAY) ×1 IMPLANT
KIT TURNOVER KIT B (KITS) ×1 IMPLANT
NS IRRIG 1000ML POUR BTL (IV SOLUTION) ×1 IMPLANT
PACK CV ACCESS (CUSTOM PROCEDURE TRAY) ×1 IMPLANT
PAD ARMBOARD 7.5X6 YLW CONV (MISCELLANEOUS) ×2 IMPLANT
POWDER SURGICEL 3.0 GRAM (HEMOSTASIS) IMPLANT
SLING ARM FOAM STRAP XLG (SOFTGOODS) IMPLANT
SUT MNCRL AB 4-0 PS2 18 (SUTURE) ×1 IMPLANT
SUT PROLENE 6 0 BV (SUTURE) ×1 IMPLANT
SUT VIC AB 3-0 SH 27 (SUTURE) ×1
SUT VIC AB 3-0 SH 27X BRD (SUTURE) ×1 IMPLANT
TOWEL GREEN STERILE (TOWEL DISPOSABLE) ×1 IMPLANT
UNDERPAD 30X36 HEAVY ABSORB (UNDERPADS AND DIAPERS) ×1 IMPLANT
WATER STERILE IRR 1000ML POUR (IV SOLUTION) ×1 IMPLANT

## 2022-02-17 NOTE — Anesthesia Procedure Notes (Signed)
Anesthesia Regional Block: Supraclavicular block   Pre-Anesthetic Checklist: , timeout performed,  Correct Patient, Correct Site, Correct Laterality,  Correct Procedure, Correct Position, site marked,  Risks and benefits discussed,  Surgical consent,  Pre-op evaluation,  At surgeon's request and post-op pain management  Laterality: Right  Prep: chloraprep       Needles:  Injection technique: Single-shot  Needle Type: Echogenic Stimulator Needle     Needle Length: 5cm  Needle Gauge: 22     Additional Needles:   Narrative:  Start time: 02/17/2022 7:06 AM End time: 02/17/2022 7:16 AM Injection made incrementally with aspirations every 5 mL.  Performed by: Personally  Anesthesiologist: Duane Boston, MD  Additional Notes: Functioning IV was confirmed and monitors applied.  A 50m 22ga echogenic arrow stimulator was used. Sterile prep and drape,hand hygiene and sterile gloves were used.Ultrasound guidance: relevant anatomy identified, needle position confirmed, local anesthetic spread visualized around nerve(s)., vascular puncture avoided.  Image printed for medical record.  Negative aspiration and negative test dose prior to incremental administration of local anesthetic. The patient tolerated the procedure well.

## 2022-02-17 NOTE — Discharge Instructions (Signed)
[facility]  Discharge Instructions  AV Fistula or Graft Surgery for Dialysis Access  Please refer to the following instructions for your post-procedure care. Your surgeon or physician assistant will discuss any changes with you.  Activity  You may drive the day following your surgery, if you are comfortable and no longer taking prescription pain medication. Resume full activity as the soreness in your incision resolves.  Bathing/Showering  You may shower after you go home. Keep your incision dry for 48 hours. Do not soak in a bathtub, hot tub, or swim until the incision heals completely. You may not shower if you have a hemodialysis catheter.  Incision Care  Clean your incision with mild soap and water after 48 hours. Pat the area dry with a clean towel. You do not need a bandage unless otherwise instructed. Do not apply any ointments or creams to your incision. You may have skin glue on your incision. Do not peel it off. It will come off on its own in about one week. Your arm may swell a bit after surgery. To reduce swelling use pillows to elevate your arm so it is above your heart. Your doctor will tell you if you need to lightly wrap your arm with an ACE bandage.  Diet  Resume your normal diet. There are not special food restrictions following this procedure. In order to heal from your surgery, it is CRITICAL to get adequate nutrition. Your body requires vitamins, minerals, and protein. Vegetables are the best source of vitamins and minerals. Vegetables also provide the perfect balance of protein. Processed food has little nutritional value, so try to avoid this.  Medications  Resume taking all of your medications. If your incision is causing pain, you may take over-the counter pain relievers such as acetaminophen (Tylenol). If you were prescribed a stronger pain medication, please be aware these medications can cause nausea and constipation. Prevent  nausea by taking the medication with a snack or meal. Avoid constipation by drinking plenty of fluids and eating foods with high amount of fiber, such as fruits, vegetables, and grains.  Do not take Tylenol if you are taking prescription pain medications.  Follow up Your surgeon may want to see you in the office following your access surgery. If so, this will be arranged at the time of your surgery.  Please call us immediately for any of the following conditions:  Increased pain, redness, drainage (pus) from your incision site Fever of 101 degrees or higher Severe or worsening pain at your incision site Hand pain or numbness.  Reduce your risk of vascular disease:  Stop smoking. If you would like help, call [facility] at 1-800-QUIT-NOW 825 654 1809) or [place] at [place] your cholesterol Maintain a desired weight Control your diabetes Keep your blood pressure down  Dialysis  It will take several weeks to several months for your new dialysis access to be ready for use. Your surgeon will determine when it is okay to use it. Your nephrologist will continue to direct your dialysis. You can continue to use your Permcath until your new access is ready for use.   02/17/2022 Frank Coffey [id number] [birth year]  Surgeon(s): Waynetta Sandy, MD  Procedure(s): RIGHT ARTERIOVENOUS (AV) FISTULA CREATION   May stick graft immediately   May stick graft on designated area only:   X Do not stick Right AV fistula for 12 weeks    If you have any questions, please call the office at  [phone]. 

## 2022-02-17 NOTE — Anesthesia Postprocedure Evaluation (Signed)
Anesthesia Post Note  Patient: Frank Coffey  Procedure(s) Performed: RIGHT ARTERIOVENOUS (AV) FISTULA CREATION (Right: Arm Lower)     Patient location during evaluation: PACU Anesthesia Type: Regional and MAC Level of consciousness: awake and alert Pain management: pain level controlled Vital Signs Assessment: post-procedure vital signs reviewed and stable Respiratory status: spontaneous breathing and respiratory function stable Cardiovascular status: stable Postop Assessment: no apparent nausea or vomiting Anesthetic complications: no   No notable events documented.  Last Vitals:  Vitals:   02/17/22 0845 02/17/22 0900  BP: 99/65 120/72  Pulse: 73 76  Resp: 15 16  Temp:  36.9 C  SpO2: 94% 100%    Last Pain:  Vitals:   02/17/22 0900  PainSc: 0-No pain                 Birda Didonato DANIEL

## 2022-02-17 NOTE — Op Note (Signed)
    Patient name: Frank Coffey MRN: [id number] DOB: July 17, 1980 Sex: male  02/17/2022 Pre-operative Diagnosis: End-stage renal disease Post-operative diagnosis:  Same Surgeon:  Erlene Quan C. Donzetta Matters, MD Assistant: Paulo Fruit, PA Procedure Performed: Right radial artery to cephalic vein AV fistula creation  Indications: 42 year old male with end-stage renal disease dialyzing via catheter.  He is left-hand dominant.  We evaluated his right upper extremity and plan for fistula versus graft for which he presents today.  All risk benefits and alternatives have been clearly discussed with the patient and his significant other and he demonstrates good understanding.  Findings: Right radial artery measured approximately 2-1/2 mm with very strong pulsatility.  The cephalic vein at the wrist was approximately 4 mm.  At completion there was a strong thrill in the fistula and distally a strong signal in the radial artery that both of these were confirmed with Doppler.   Procedure:  The patient was identified in the holding area and taken to the operating room where his placed upon the operative table and MAC anesthesia was induced.  He was sterilely prepped and draped in the right upper extremity usual fashion, antibiotics were administered timeout was called.  Ultrasound was used to identify what appeared to be a suitable cephalic vein and radial artery at the wrist.  The block was tested noted to be intact.  A vertical incision was made we dissected down first to the vein marked this for orientation branches were divided between clips and ties.  We then dissected to the deep fascia sharply without using cautery to avoid radial nerve injury.  The radial artery was identified and encircled with vessel loop.  The vein was then clamped distally and tied off and then dilated and flushed with heparinized saline and reclamped.  The artery was then clamped distally and proximally opened longitudinally.  The vein was sewn into  side with 6-0 Prolene suture.  Prior completion without flushing all directions.  Upon completion there was a very strong thrill in the fistula we did free up some of the soft tissue around the fistula and traced the signal with Doppler.  There was a palpable radial artery pulse distally and the wound and this was confirmed with Doppler as well.  The wound was irrigated hemostasis was obtained we then closed in layers with Vicryl and Monocryl.  Dermabond was placed at the skin level.  Patient was awakened from anesthesia having tolerated the procedure well without immediate complication.  All counts were correct at completion.   Given the complexity of the case,  the assistant was necessary in order to expedite the procedure and safely perform the technical aspects of the operation.  The assistant provided traction and countertraction to assist with exposure of the artery and vein.  They also assisted with suture ligation of multiple venous branches.  They played a critical role in the anastomosis. These skills, especially following the Prolene suture for the anastomosis, could not have been adequately performed by a scrub tech assistant.    EBL: 20 cc     Rishard Delange C. Donzetta Matters, MD [facility] [place] Office: [phone] Pager: [phone]

## 2022-02-17 NOTE — Telephone Encounter (Signed)
-----   Message from Karoline Caldwell, [place] sent at 02/17/2022 10:36 AM EST ----- S/p right AV fistula by Dr. Donzetta Matters. Needs follow up in 4-6 weeks with fistula duplex. thanks

## 2022-02-17 NOTE — Transfer of Care (Signed)
Immediate Anesthesia Transfer of Care Note  Patient: Frank Coffey  Procedure(s) Performed: RIGHT ARTERIOVENOUS (AV) FISTULA CREATION (Right: Arm Lower)  Patient Location: PACU  Anesthesia Type:MAC and Regional  Level of Consciousness: awake, alert , and oriented  Airway & Oxygen Therapy: Patient Spontanous Breathing  Post-op Assessment: Report given to RN and Post -op Vital signs reviewed and stable  Post vital signs: Reviewed and stable  Last Vitals:  Vitals Value Taken Time  BP 104/69 02/17/22 0830  Temp    Pulse 77 02/17/22 0830  Resp 20 02/17/22 0830  SpO2 93 % 02/17/22 0830  Vitals shown include unvalidated device data.  Last Pain:  Vitals:   02/17/22 0607  PainSc: 0-No pain         Complications: No notable events documented.

## 2022-02-17 NOTE — Interval H&P Note (Signed)
History and Physical Interval Note:  02/17/2022 7:24 AM  Frank Coffey  has presented today for surgery, with the diagnosis of End Stage Renal Disease.  The various methods of treatment have been discussed with the patient and family. After consideration of risks, benefits and other options for treatment, the patient has consented to  Procedure(s): RIGHT ARTERIOVENOUS (AV) FISTULA CREATION (Right) as a surgical intervention.  The patient's history has been reviewed, patient examined, no change in status, stable for surgery.  I have reviewed the patient's chart and labs.  Questions were answered to the patient's satisfaction.     Servando Snare

## 2022-02-17 NOTE — Anesthesia Procedure Notes (Signed)
Procedure Name: MAC Date/Time: 02/17/2022 7:35 AM  Performed by: Harden Mo, CRNAPre-anesthesia Checklist: Patient identified, Emergency Drugs available, Suction available and Patient being monitored Patient Re-evaluated:Patient Re-evaluated prior to induction Oxygen Delivery Method: Supernova nasal CPAP Preoxygenation: Pre-oxygenation with 100% oxygen Induction Type: IV induction Placement Confirmation: positive ETCO2 and breath sounds checked- equal and bilateral Dental Injury: Teeth and Oropharynx as per pre-operative assessment

## 2022-02-17 NOTE — Anesthesia Preprocedure Evaluation (Addendum)
Anesthesia Evaluation  Patient identified by MRN, date of birth, ID band Patient awake    Reviewed: Allergy & Precautions, NPO status , Patient's Chart, lab work & pertinent test results, reviewed documented beta blocker date and time   History of Anesthesia Complications Negative for: history of anesthetic complications  Airway Mallampati: III  TM Distance: >3 FB Neck ROM: Full    Dental no notable dental hx. (+) Dental Advisory Given   Pulmonary sleep apnea    Pulmonary exam normal        Cardiovascular hypertension, Pt. on medications and Pt. on home beta blockers +CHF  Normal cardiovascular exam+ dysrhythmias   IMPRESSIONS     1. Left ventricular ejection fraction, by estimation, is 35 to 40%. The  left ventricle has moderately decreased function. The left ventricle  demonstrates global hypokinesis. Left ventricular diastolic parameters are  consistent with Grade I diastolic  dysfunction (impaired relaxation).   2. Right ventricular systolic function is normal. The right ventricular  size is normal.   3. The mitral valve is normal in structure. No evidence of mitral valve  regurgitation. No evidence of mitral stenosis.   4. The aortic valve is normal in structure. Aortic valve regurgitation is  not visualized. No aortic stenosis is present.   5. Aortic dilatation noted. There is mild dilatation of the ascending  aorta, measuring 43 mm.   6. The inferior vena cava is dilated in size with >50% respiratory  variability, suggesting right atrial pressure of 8 mmHg.   Comparison(s): Prior images reviewed side by side. 02/18/20 EF 35%.     Neuro/Psych  Headaches PSYCHIATRIC DISORDERS  Depression    TIA   GI/Hepatic Neg liver ROS,GERD  ,,  Endo/Other    Morbid obesity  Renal/GU ESRFRenal disease     Musculoskeletal negative musculoskeletal ROS (+)    Abdominal   Peds  Hematology negative hematology ROS (+)    Anesthesia Other Findings Day of surgery medications reviewed with the patient.  Reproductive/Obstetrics                             Anesthesia Physical Anesthesia Plan  ASA: 4  Anesthesia Plan: Regional   Post-op Pain Management: Regional block* and Tylenol PO (pre-op)*   Induction:   PONV Risk Score and Plan: 2 and Propofol infusion and Treatment may vary due to age or medical condition  Airway Management Planned: Natural Airway, Simple Face Mask and Nasal CPAP  Additional Equipment:   Intra-op Plan:   Post-operative Plan:   Informed Consent: I have reviewed the patients History and Physical, chart, labs and discussed the procedure including the risks, benefits and alternatives for the proposed anesthesia with the patient or authorized representative who has indicated his/her understanding and acceptance.       Plan Discussed with: Anesthesiologist, CRNA and Surgeon  Anesthesia Plan Comments:         Anesthesia Quick Evaluation

## 2022-02-18 ENCOUNTER — Encounter (HOSPITAL_COMMUNITY): Payer: Self-pay | Admitting: Vascular Surgery

## 2022-03-01 NOTE — Progress Notes (Signed)
This encounter was created in error - please disregard.

## 2022-03-07 NOTE — Progress Notes (Unsigned)
Cardiology Office Note:    Date:  03/08/2022   ID:  Frank Coffey, DOB [birth year], MRN [id number]  PCP:  Flossie Buffy, NP  [facility] Cardiologist:  Werner Lean, MD  [facility] Electrophysiologist:  None   CC: HF F/U  History of Present Illness:    Frank Coffey is a 42 y.o. 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  In interim of this visit, patient had borderline dilation of his thoracic aorta wi 2022: th stable EF.  Has started classes for HD.   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 Had a prolonged hospitalization. 2024: has recent AV fistula.  Patient notes that he is doing well. Since that complex admission he has lost 39 lbs. BP is controlled.    Feel numb with his HD catheter.  No cardiac chest pain or pressure .  No SOB/DOE and no PND/Orthopnea.  No weight gain or leg swelling.  No palpitations or syncope.  He cannot afford GLP-1 therapy for weight loss.  Needs  Has Hypotension with HD and had to drop his Coreg dose.   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   History of kidney stones    Passed   Hx of migraine headaches    Hyperlipidemia    Hypertension    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)    IBS (irritable bowel syndrome)    Obesity    OSA (obstructive sleep apnea)    Pneumonia 04/2019   with Covid   Sleep apnea     Past Surgical History:  Procedure Laterality Date   AV FISTULA PLACEMENT  Right 02/17/2022   Procedure: RIGHT ARTERIOVENOUS (AV) FISTULA CREATION;  Surgeon: Waynetta Sandy, MD;  Location: [place];  Service: Vascular;  Laterality: Right;   BIOPSY  02/09/2020   Procedure: BIOPSY;  Surgeon: Irving Copas., MD;  Location[facility] ENDOSCOPY;  Service: Gastroenterology;;   CHOLECYSTECTOMY     COLONOSCOPY WITH PROPOFOL N/A 02/09/2020   Procedure: COLONOSCOPY WITH PROPOFOL;  Surgeon: Irving Copas., MD;  Location: [place];  Service: Gastroenterology;  Laterality: N/A;   ESOPHAGOGASTRODUODENOSCOPY (EGD) WITH PROPOFOL N/A 02/09/2020   Procedure: ESOPHAGOGASTRODUODENOSCOPY (EGD) WITH PROPOFOL;  Surgeon: Rush Landmark Telford Nab., MD;  Location: [place];  Service: Gastroenterology;  Laterality: N/A;   IR FLUORO GUIDE CV LINE RIGHT  12/07/2021   IR US GUIDE VASC ACCESS RIGHT  12/07/2021   POLYPECTOMY  02/09/2020   Procedure: POLYPECTOMY;  Surgeon: Irving Copas., MD;  Location: [facility] ENDOSCOPY;  Service: Gastroenterology;;   RETINAL DETACHMENT REPAIR W/ SCLERAL BUCKLE LE     Left    Current Medications: Current Meds  Medication Sig   albuterol (VENTOLIN HFA) 108 (90 Base) MCG/ACT inhaler Inhale 1-2 puffs into the lungs every 6 (six) hours as needed for wheezing (cough).   allopurinol (ZYLOPRIM) 300 MG tablet Take 0.5 tablets (150 mg  total) by mouth every dialysis (after dialysis). (Patient taking differently: Take 300 mg by mouth 3 (three) times a week.)   benzonatate (TESSALON) 100 MG capsule Take 100 mg by mouth as needed for cough.   brimonidine (ALPHAGAN) 0.2 % ophthalmic solution Place 1 drop into the left eye 3 (three) times daily.   carvedilol (COREG) 25 MG tablet Take 0.5 tablets (12.5 mg total) by mouth 2 (two) times daily with a meal. TAKE 2 TABLETS(50 MG) BY MOUTH TWICE DAILY   dorzolamide-timolol (COSOPT) 22.3-6.8 MG/ML ophthalmic solution Place 1 drop into both eyes 3 (three) times daily.   Doxercalciferol (HECTOROL IV) as directed.  On dialysis days   EQ FIBER SUPPLEMENT PO Take 1 tablet by mouth daily.   isosorbide mononitrate (IMDUR) 60 MG 24 hr tablet Take 60 mg by mouth daily.   latanoprost (XALATAN) 0.005 % ophthalmic solution Place 1 drop into the left eye at bedtime.   lidocaine-prilocaine (EMLA) cream Apply topically as directed. Dialysis days   LINZESS 145 MCG CAPS capsule TAKE 1 CAPSULE(145 MCG) BY MOUTH DAILY BEFORE BREAKFAST. GIVEN**   ondansetron (ZOFRAN) 4 MG tablet Take 1 tablet (4 mg total) by mouth every 8 (eight) hours as needed for nausea or vomiting.   sevelamer carbonate (RENVELA) 800 MG tablet Take 800 mg by mouth 3 (three) times daily with meals.     Allergies:   Patient has no known allergies.   Social History   Socioeconomic History   Marital status: Divorced    Spouse name: Not on file   Number of children: Not on file   Years of education: Not on file   Highest education level: Not on file  Occupational History   Not on file  Tobacco Use   Smoking status: Never   Smokeless tobacco: Never  Vaping Use   Vaping Use: Never used  Substance and Sexual Activity   Alcohol use: Not Currently    Comment: occ   Drug use: Never   Sexual activity: Not on file  Other Topics Concern   Not on file  Social History Narrative   Epworth Sleepiness Scale = 10 (as of 12/01/14)   Social Determinants of Health   Financial Resource Strain: Not on file  Food Insecurity: Not on file  Transportation Needs: Not on file  Physical Activity: Not on file  Stress: Not on file  Social Connections: Not on file    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, Freight forwarder at [employer]  Family History: The patient's family history includes Alzheimer's disease in his maternal grandfather; Cancer - Other in his paternal grandmother; Depression in his mother; Healthy in his brother, daughter, sister, and son; Heart disease in his maternal grandfather, maternal grandmother, and mother; Hyperlipidemia in his maternal  grandfather, maternal grandmother, and mother; Hypertension in his maternal grandfather, maternal grandmother, and mother; Kidney disease in his mother; Stomach cancer in his father; Stroke in his maternal grandfather and maternal grandmother. There is no history of Diabetes, Heart attack, Sudden death, Colon cancer, Esophageal cancer, Pancreatic cancer, Inflammatory bowel disease, Liver disease, or Rectal cancer. Notes that his mother died of NCIM NOS and does not want to end up like her.  Notes that he was her POA and has records confirmed his previous records request.  ROS:   Please see the history of present illness.     All other systems reviewed and are negative.  EKGs/Labs/Other Studies Reviewed:    The following studies were reviewed today:  EKG:   02/12/2020: SR  1st HB LVH with 2nd repolarization 05/01/19: SR rate 62 with LVH  Cardiac Studies & Procedures       ECHOCARDIOGRAM  ECHOCARDIOGRAM COMPLETE 11/08/2021  Narrative ECHOCARDIOGRAM REPORT    Patient Name:   Frank Coffey Date of Exam: 11/08/2021 Medical Rec #:  [id number]      Height:       68.5 in Accession #:    [id number]     Weight:       377.0 lb Date of Birth:  1980-03-10       BSA:          2.690 m Patient Age:    6 years       BP:           140/68 mmHg Patient Gender: M              HR:           67 bpm. Exam Location:  [place]  Procedure: 2D Echo, Cardiac Doppler, Color Doppler and Intracardiac Opacification Agent  Indications:    I50.22 CHF  History:        Patient has prior history of Echocardiogram examinations, most recent 02/18/2020. Cardiomyopathy and CHF, TIA, Arrythmias:1st degree AV block; Risk Factors:Hypertension, Morbid obesity, Dyslipidemia and Sleep Apnea. COVID 04/2019.  Sonographer:    Basilia Jumbo [facility], RDCS Referring Phys: [id number] [facility]   Sonographer Comments: Technically difficult study due to poor echo windows and suboptimal apical window. Image acquisition  challenging due to patient body habitus. IMPRESSIONS   1. Left ventricular ejection fraction, by estimation, is 35 to 40%. The left ventricle has moderately decreased function. The left ventricle demonstrates global hypokinesis. Left ventricular diastolic parameters are consistent with Grade I diastolic dysfunction (impaired relaxation). 2. Right ventricular systolic function is normal. The right ventricular size is normal. 3. The mitral valve is normal in structure. No evidence of mitral valve regurgitation. No evidence of mitral stenosis. 4. The aortic valve is normal in structure. Aortic valve regurgitation is not visualized. No aortic stenosis is present. 5. Aortic dilatation noted. There is mild dilatation of the ascending aorta, measuring 43 mm. 6. The inferior vena cava is dilated in size with >50% respiratory variability, suggesting right atrial pressure of 8 mmHg.  Comparison(s): Prior images reviewed side by side. 02/18/20 EF 35%.  FINDINGS Left Ventricle: Left ventricular ejection fraction, by estimation, is 35 to 40%. The left ventricle has moderately decreased function. The left ventricle demonstrates global hypokinesis. Definity contrast agent was given IV to delineate the left ventricular endocardial borders. The left ventricular internal cavity size was normal in size. There is no left ventricular hypertrophy. Left ventricular diastolic parameters are consistent with Grade I diastolic dysfunction (impaired relaxation).   LV Wall Scoring: The posterior wall and mid inferior segment are akinetic.  Right Ventricle: The right ventricular size is normal. No increase in right ventricular wall thickness. Right ventricular systolic function is normal.  Left Atrium: Left atrial size was normal in size.  Right Atrium: Right atrial size was normal in size.  Pericardium: There is no evidence of pericardial effusion.  Mitral Valve: The mitral valve is normal in structure. No evidence  of mitral valve regurgitation. No evidence of mitral valve stenosis.  Tricuspid Valve: The tricuspid valve is normal in structure. Tricuspid valve regurgitation is not demonstrated. No evidence of tricuspid stenosis.  Aortic Valve: The aortic valve is normal in structure. Aortic valve regurgitation is not visualized. No aortic stenosis is present.  Pulmonic Valve: The pulmonic valve was normal in structure. Pulmonic valve regurgitation is not visualized. No evidence of pulmonic stenosis.  Aorta: Aortic dilatation noted. There is mild dilatation of the ascending aorta, measuring 43 mm.  Venous: The inferior vena cava is dilated in size with greater than 50% respiratory variability, suggesting right atrial pressure of 8 mmHg.  IAS/Shunts: No atrial level shunt detected by color flow Doppler.   LEFT VENTRICLE PLAX 2D LVIDd:         6.90 cm   Diastology LVIDs:         5.00 cm   LV e' medial:    5.70 cm/s LV PW:         1.30 cm   LV E/e' medial:  13.6 LV IVS:        1.30 cm   LV e' lateral:   4.19 cm/s LVOT diam:     3.20 cm   LV E/e' lateral: 18.5 LV SV:         160 LV SV Index:   59 LVOT Area:     8.04 cm   RIGHT VENTRICLE             IVC RV Basal diam:  3.90 cm     IVC diam: 2.10 cm RV S prime:     12.60 cm/s TAPSE (M-mode): 3.0 cm  LEFT ATRIUM              Index        RIGHT ATRIUM           Index LA diam:        6.00 cm  2.23 cm/m   RA Pressure: 8.00 mmHg LA Vol (A2C):   127.0 ml 47.21 ml/m  RA Area:     22.50 cm LA Vol (A4C):   80.0 ml  29.74 ml/m  RA Volume:   69.40 ml  25.80 ml/m LA Biplane Vol: 105.0 ml 39.03 ml/m AORTIC VALVE LVOT Vmax:   106.00 cm/s LVOT Vmean:  67.600 cm/s LVOT VTI:    0.199 m  AORTA Ao Root diam: 4.10 cm Ao Asc diam:  4.30 cm  MITRAL VALVE               TRICUSPID VALVE Estimated RAP:  8.00 mmHg MV Decel Time: 236 msec MV E velocity: 77.60 cm/s  SHUNTS MV A velocity: 80.70 cm/s  Systemic VTI:  0.20 m MV E/A ratio:  0.96        Systemic  Diam: 3.20 cm  Candee Furbish MD Electronically signed by Candee Furbish MD Signature Date/Time: 11/08/2021/12:56:11 PM    Final    MONITORS  CARDIAC EVENT MONITOR 01/20/2018  Narrative 14 Day Event Monitor  Quality: Fair.  Baseline artifact. Predominant rhythm: sinus rhythm Average heart rate: 81 bpm Max heart rate: 114 bpm Min heart rate: 49 bpm  One episode of second degree heart block, Mobitz type I No other arrhythmias noted.  Tiffany C. Oval Linsey, MD, [facility] 01/20/2018 9:26 AM            Recent Labs: 07/12/2021: TSH 3.510 12/06/2021: ALT 31; Magnesium 2.3 12/10/2021: Platelets 145 02/17/2022: BUN 35; Creatinine, Ser 11.80; Hemoglobin 12.9; Potassium 3.7; Sodium 139  Recent Lipid Panel    Component Value Date/Time   CHOL 181 07/12/2021 1207   TRIG 92 07/12/2021 1207   HDL 41 07/12/2021 1207   CHOLHDL 6 05/29/2018 0923   VLDL 20.8 05/29/2018 0923   LDLCALC 123 (H) 07/12/2021 1207  Physical Exam:    VS:  BP 128/80   Pulse 94   Ht 5' 8"$  (1.727 m)   Wt (!) 346 lb (156.9 kg)   SpO2 97%   BMI 52.61 kg/m     Wt Readings from Last 3 Encounters:  03/08/22 (!) 346 lb (156.9 kg)  02/17/22 (!) 337 lb (152.9 kg)  01/18/22 (!) 342 lb (155.1 kg)   Gen: No distress morbid obesity   Neck: No JVD Cardiac: No Rubs or Gallops, No murmur, RRR +2 radial pulses Respiratory: Clear to auscultation bilaterally, normal effort, normal  respiratory rate GI: Soft, nontender, non-distended  MS: no edema;  moves all extremities Integument: Skin feels warm Neuro:  At time of evaluation, alert and oriented to person/place/time/situation  Psych: Normal affect, patient feels   ASSESSMENT:    1. NICM (nonischemic cardiomyopathy) ([place])   2. Chronic systolic CHF (congestive heart failure) ([place])   3. Benign hypertension with chronic kidney disease, stage IV ([place])   4. Mobitz type 1 second degree atrioventricular block   5. OSA (obstructive sleep apnea)   6. Mixed hyperlipidemia      PLAN:    Heart Failure Reduced Ejection Fraction HTN ESRD Morbid Obesity OSA on BIPAP - NYHA class I, Stage C, euvolemic, etiology HTN mediated; PET MPI is ordered next week (largely for [facility]T eval at [facility], has not has prior ischemic work up) - Diuretic regimen: Volume removal per HD - Coreg dose dropped to 12.5 mg PO BID due to hypotensions - ARNI/ARB/ACEi/aldactone/SGLT2i stopped in the setting of ESRD - continue Coreg - given his BMI and comorbidities GLP-1 therapy gives him the best change at being able to get to [place].    Hyperlipidemia -LDL goal less than 100 to start; there is a query -continue current statin, also would benefit from continued weight loss  Obesity - no history of MEN2a or medullary thyroid cancer - no history of pancreatitis or gallstones. - BMI is 30+  with weight related comorbidities including HTN, HLD, OSA, and HFrEF - would meet criteria for GLP-1 based therapy; discussed with out team to see if Christella Scheuermann will cover  Six months One year    Medication Adjustments/Labs and Tests Ordered: Current medicines are reviewed at length with the patient today.  Concerns regarding medicines are outlined above.  No orders of the defined types were placed in this encounter.   No orders of the defined types were placed in this encounter.    Patient Instructions  Medication Instructions:  Your physician recommends that you continue on your current medications as directed. Please refer to the Current Medication list given to you today.  *If you need a refill on your cardiac medications before your next appointment, please call your pharmacy*   Follow-Up: At [facility], you and your health needs are our priority.  As part of our continuing mission to provide you with exceptional heart care, we have created designated Provider Care Teams.  These Care Teams include your primary Cardiologist (physician) and Advanced Practice Providers (APPs -  Physician  Assistants and Nurse Practitioners) who all work together to provide you with the care you need, when you need it.  Your next appointment:   6 month(s)  Provider:   Werner Lean, MD        Signed, Werner Lean, MD  03/08/2022 9:30 AM    [place]

## 2022-03-08 ENCOUNTER — Telehealth: Payer: Self-pay | Admitting: Pharmacist

## 2022-03-08 ENCOUNTER — Ambulatory Visit: Payer: Managed Care, Other (non HMO) | Attending: Internal Medicine | Admitting: Internal Medicine

## 2022-03-08 ENCOUNTER — Encounter: Payer: Self-pay | Admitting: Internal Medicine

## 2022-03-08 VITALS — BP 128/80 | HR 94 | Ht 68.0 in | Wt 346.0 lb

## 2022-03-08 DIAGNOSIS — E782 Mixed hyperlipidemia: Secondary | ICD-10-CM

## 2022-03-08 DIAGNOSIS — I129 Hypertensive chronic kidney disease with stage 1 through stage 4 chronic kidney disease, or unspecified chronic kidney disease: Secondary | ICD-10-CM

## 2022-03-08 DIAGNOSIS — I5022 Chronic systolic (congestive) heart failure: Secondary | ICD-10-CM | POA: Diagnosis not present

## 2022-03-08 DIAGNOSIS — G4733 Obstructive sleep apnea (adult) (pediatric): Secondary | ICD-10-CM

## 2022-03-08 DIAGNOSIS — I428 Other cardiomyopathies: Secondary | ICD-10-CM | POA: Diagnosis not present

## 2022-03-08 DIAGNOSIS — I441 Atrioventricular block, second degree: Secondary | ICD-10-CM | POA: Diagnosis not present

## 2022-03-08 DIAGNOSIS — N184 Chronic kidney disease, stage 4 (severe): Secondary | ICD-10-CM

## 2022-03-08 DIAGNOSIS — Z6841 Body Mass Index (BMI) 40.0 and over, adult: Secondary | ICD-10-CM

## 2022-03-08 NOTE — Patient Instructions (Signed)
Medication Instructions:  Your physician recommends that you continue on your current medications as directed. Please refer to the Current Medication list given to you today.  *If you need a refill on your cardiac medications before your next appointment, please call your pharmacy*   Follow-Up: At [facility], you and your health needs are our priority.  As part of our continuing mission to provide you with exceptional heart care, we have created designated Provider Care Teams.  These Care Teams include your primary Cardiologist (physician) and Advanced Practice Providers (APPs -  Physician Assistants and Nurse Practitioners) who all work together to provide you with the care you need, when you need it.  Your next appointment:   6 month(s)  Provider:   Werner Lean, MD

## 2022-03-08 NOTE — Telephone Encounter (Addendum)
Pt interested in [place] for weight loss. No DM (has preDM), has commercial [employer]. Tried submitting prior auth on [place], received same message on both requests that "drug is not covered by current benefit plan." Pt aware his insurance does not cover weight loss meds.

## 2022-03-10 ENCOUNTER — Encounter: Payer: 59 | Admitting: Nurse Practitioner

## 2022-03-15 ENCOUNTER — Ambulatory Visit (HOSPITAL_COMMUNITY): Payer: Managed Care, Other (non HMO)

## 2022-03-21 ENCOUNTER — Other Ambulatory Visit: Payer: Self-pay

## 2022-03-21 DIAGNOSIS — N186 End stage renal disease: Secondary | ICD-10-CM

## 2022-03-30 ENCOUNTER — Telehealth: Payer: Self-pay | Admitting: Nurse Practitioner

## 2022-03-30 ENCOUNTER — Encounter: Payer: Managed Care, Other (non HMO) | Admitting: Nurse Practitioner

## 2022-03-30 NOTE — Telephone Encounter (Signed)
Pt was a no show for a physical with [name] on 03/30/22. He is still in his dialysis app. I did not send a letter.

## 2022-03-30 NOTE — Telephone Encounter (Signed)
No fee generated - pt indicated he was in dialysis Pt missed appt 12/23/2021 for same reason and fee was waived.   Please advise if you would like to proceed differently.

## 2022-04-04 ENCOUNTER — Encounter: Payer: Self-pay | Admitting: Nurse Practitioner

## 2022-04-05 ENCOUNTER — Ambulatory Visit (HOSPITAL_COMMUNITY): Payer: Managed Care, Other (non HMO)

## 2022-04-05 NOTE — Telephone Encounter (Signed)
Late entry Letter sent via mychart 3/19 requesting pt not to schedule when he has dialysis.

## 2022-04-07 ENCOUNTER — Telehealth: Payer: Managed Care, Other (non HMO) | Admitting: Nurse Practitioner

## 2022-04-07 DIAGNOSIS — H6990 Unspecified Eustachian tube disorder, unspecified ear: Secondary | ICD-10-CM | POA: Diagnosis not present

## 2022-04-07 MED ORDER — IPRATROPIUM BROMIDE 0.03 % NA SOLN: 2.0000 | Freq: Two times a day (BID) | NASAL | 12 refills | Status: DC

## 2022-04-07 NOTE — Progress Notes (Signed)
E-Visit for Ear Pain - Eustachian Tube Dysfunction   We are sorry that you are not feeling well. Here is how we plan to help!  Based on what you have shared with me it looks like you have Eustachian Tube Dysfunction.  Eustachian Tube Dysfunction is a condition where the tubes that connect your middle ears to your upper throat become blocked. This can lead to discomfort, hearing difficulties and a feeling of fullness in your ear. Eustachian tube dysfunction usually resolves itself in a few days. The usual symptoms include: Hearing problems Tinnitus, or ringing in your ears Clicking or popping sounds A feeling of fullness in your ears Pain that mimics an ear infection Dizziness, vertigo or balance problems A "tickling" sensation in your ears  ?Eustachian tube dysfunction symptoms may get worse in higher altitudes. This is called barotrauma, and it can happen while scuba diving, flying in an airplane or driving in the mountains.   What causes eustachian tube dysfunction? Allergies and infections (like the common cold and the flu) are the most common causes of eustachian tube dysfunction. These conditions can cause inflammation and mucus buildup, leading to blockage. GERD, or chronic acid reflux, can also cause ETD. This is because stomach acid can back up into your throat and result in inflammation. As mentioned above, altitude changes can also cause ETD.   What are some common eustachian tube dysfunction treatments? In most cases, treatment isn't necessary because ETD often resolves on its own. However, you might need treatment if your symptoms linger for more than two weeks.    Eustachian tube dysfunction treatment depends on the cause and the severity of your condition. Treatments may include home remedies, medications or, in severe cases, surgery.     HOME CARE: Sometimes simple home remedies can help with mild cases of eustachian tube dysfunction. To try and clear the blockage, you  can: Chew gum. Yawn. Swallow. Try the Valsalva maneuver (breathing out forcefully while closing your mouth and pinching your nostrils). Use a saline spray to clear out nasal passages.  MEDICATIONS: Over-the-counter medications can help if allergies are causing eustachian tube dysfunction. Try antihistamines (like cetirizine or diphenhydramine) to ease your symptoms. If you have discomfort, pain relievers -- such as acetaminophen or ibuprofen -- can help.  Sometimes intranasal glucocorticosteroids (like Flonase or Nasacort) help.  I have prescribed Ipratropium Bromide nasal spray 0.03% two sprays in each nostril 2-3 times a day Meds ordered this encounter  Medications   ipratropium (ATROVENT) 0.03 % nasal spray    Sig: Place 2 sprays into both nostrils every 12 (twelve) hours.    Dispense:  30 mL    Refill:  12      GET HELP RIGHT AWAY IF: Fever is over 102.2 degrees. You develop progressive ear pain or hearing loss. Ear symptoms persist longer than 3 days after treatment.  MAKE SURE YOU: Understand these instructions. Will watch your condition. Will get help right away if you are not doing well or get worse.  Thank you for choosing an e-visit.  Your e-visit answers were reviewed by a board certified advanced clinical practitioner to complete your personal care plan. Depending upon the condition, your plan could have included both over the counter or prescription medications.  Please review your pharmacy choice. Make sure the pharmacy is open so you can pick up the prescription now. If there is a problem, you may contact your provider through [employer] and have the prescription routed to another pharmacy.  Your safety  is important to [place]. If you have drug allergies check your prescription carefully.   For the next 24 hours you can use MyChart to ask questions about today's visit, request a non-urgent call back, or ask for a work or school excuse. You will get an email with a  survey after your eVisit asking about your experience. We would appreciate your feedback. I hope that your e-visit has been valuable and will aid in your recovery.  I spent approximately 5 minutes reviewing the patient's history, current symptoms and coordinating their care today.

## 2022-04-10 ENCOUNTER — Other Ambulatory Visit: Payer: Self-pay | Admitting: Internal Medicine

## 2022-04-10 ENCOUNTER — Telehealth (HOSPITAL_COMMUNITY): Payer: Self-pay | Admitting: Emergency Medicine

## 2022-04-10 DIAGNOSIS — I428 Other cardiomyopathies: Secondary | ICD-10-CM

## 2022-04-10 NOTE — Progress Notes (Signed)
Order only

## 2022-04-11 ENCOUNTER — Telehealth (HOSPITAL_COMMUNITY): Payer: Self-pay | Admitting: Emergency Medicine

## 2022-04-11 DIAGNOSIS — I428 Other cardiomyopathies: Secondary | ICD-10-CM

## 2022-04-11 NOTE — Telephone Encounter (Signed)
Pt wishes to cancel cardiac PET scan due to claustrophobia. Will send message to ordering MD Marchia Bond RN Navigator Cardiac Imaging [facility] [phone] Office  [phone] Cell

## 2022-04-12 ENCOUNTER — Ambulatory Visit (HOSPITAL_COMMUNITY): Payer: Managed Care, Other (non HMO)

## 2022-04-12 ENCOUNTER — Encounter (HOSPITAL_COMMUNITY): Payer: Self-pay

## 2022-04-12 NOTE — Telephone Encounter (Signed)
Called pt advised of MD recommendation: Unfortunate.  He is now on HD with no plan to come off unless he gets an kidney, which he would need ischemic testing for.  He can get a CCTA.   Pt is agreeable to testing.  Reports does not have chest pain.  Feels it's more of an ache has not had CP in a while.  Pt reports started HD in Nov. Per guidelines pt will need a clearance from nephrology before can proceed with testing.  Pt has to have been on dialysis for 6 months before testing can be completed without clearance.   Pt goes to [facility]  Pt of Dr. Posey Pronto with [facility].  Will send to Dr. Gasper Sells to contact [facility] for guidelines on contrast.

## 2022-04-13 ENCOUNTER — Encounter: Payer: Self-pay | Admitting: Internal Medicine

## 2022-04-13 NOTE — Telephone Encounter (Signed)
Spoke with patient and discussed Dr. Oralia Rud recommendation:  Nephrology does not feel comfortable clearing him until 6 months for unclear reasons. He was clasutrophobic for PET. We had a D=SPECT Camera at [facility] that would not enclose him.  Consent is in from PET eva.  We will do lexiscan.    Patient agreeable to [facility]. Scheduler to call patient to schedule appointment for test. Instruction letter sent to patient via [place]. Patient verbalized understanding.

## 2022-04-21 ENCOUNTER — Telehealth (HOSPITAL_COMMUNITY): Payer: Self-pay | Admitting: *Deleted

## 2022-04-21 NOTE — Telephone Encounter (Signed)
Patient given detailed instructions per Myocardial Perfusion Study Information Sheet for the test on 04/26/2022 at 8:15. Patient notified to arrive 15 minutes early and that it is imperative to arrive on time for appointment to keep from having the test rescheduled.  If you need to cancel or reschedule your appointment, please call the office within 24 hours of your appointment. . Patient verbalized understanding.Frank Coffey

## 2022-04-26 ENCOUNTER — Ambulatory Visit (HOSPITAL_COMMUNITY): Payer: Managed Care, Other (non HMO) | Attending: Internal Medicine

## 2022-04-26 DIAGNOSIS — I428 Other cardiomyopathies: Secondary | ICD-10-CM

## 2022-04-26 MED ORDER — REGADENOSON 0.4 MG/5ML IV SOLN
0.4000 mg | Freq: Once | INTRAVENOUS | Status: AC
  Administered 2022-04-26: 0.4 mg via INTRAVENOUS

## 2022-04-26 MED ORDER — TECHNETIUM TC 99M TETROFOSMIN IV KIT
32.0000 | PACK | Freq: Once | INTRAVENOUS | Status: AC | PRN
  Administered 2022-04-26: 32 via INTRAVENOUS

## 2022-04-28 ENCOUNTER — Ambulatory Visit (HOSPITAL_COMMUNITY): Payer: Managed Care, Other (non HMO) | Attending: Cardiovascular Disease

## 2022-04-28 LAB — MYOCARDIAL PERFUSION IMAGING
LV dias vol: 152 mL (ref 62–150)
LV sys vol: 97 mL
Nuc Stress EF: 36 %
Peak HR: 87 {beats}/min
Rest HR: 70 {beats}/min
Rest Nuclear Isotope Dose: 30.1 mCi
SDS: 0
SRS: 0
SSS: 0
ST Depression (mm): 0 mm
Stress Nuclear Isotope Dose: 32 mCi
TID: 0.84

## 2022-04-28 MED ORDER — TECHNETIUM TC 99M TETROFOSMIN IV KIT
30.1000 | PACK | Freq: Once | INTRAVENOUS | Status: AC | PRN
  Administered 2022-04-28: 30.1 via INTRAVENOUS

## 2022-05-03 ENCOUNTER — Ambulatory Visit: Payer: Managed Care, Other (non HMO) | Admitting: Physician Assistant

## 2022-05-03 ENCOUNTER — Ambulatory Visit (HOSPITAL_COMMUNITY)
Admission: RE | Admit: 2022-05-03 | Discharge: 2022-05-03 | Disposition: A | Discharge status: Home / Self Care | Payer: Managed Care, Other (non HMO) | Source: Ambulatory Visit | Attending: Vascular Surgery | Admitting: Vascular Surgery

## 2022-05-03 ENCOUNTER — Encounter: Payer: Self-pay | Admitting: Vascular Surgery

## 2022-05-03 VITALS — BP 172/110 | HR 59 | Temp 97.9°F | Ht 68.0 in | Wt 345.0 lb

## 2022-05-03 DIAGNOSIS — N186 End stage renal disease: Secondary | ICD-10-CM

## 2022-05-03 NOTE — Progress Notes (Signed)
Office Note     CC:  follow up Requesting Provider:  Anne Ng, NP  HPI: Frank Coffey is a 42 y.o. (Jul 16, 1980) male who presents status post right radiocephalic fistula creation by Dr. Randie Heinz on 02/17/2022.  He is currently dialyzing via right IJ [facility] on a Tuesday Thursday Saturday schedule.  He works in the [employer] center for [employer] on [address].  He denies steal symptoms in his right hand.  He states the incision is well-healed.  He reports finally having regular bowel movements after anesthesia and narcotic use after fistula surgery.  He attributes this to his medical history of irritable bowel syndrome.  It should also be noted that he has met with a transplant team in the past however at that time he weighed over 400 pounds.  He is now down to 335 pounds and plans to reach back out to the transplant team to potentially be added to the transplant list.   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    IBS (irritable bowel syndrome)    Obesity    OSA (obstructive sleep apnea)    Pneumonia 04/2019   with Covid   Sleep apnea     Past Surgical History:  Procedure Laterality Date   AV FISTULA PLACEMENT Right 02/17/2022   Procedure: RIGHT ARTERIOVENOUS (AV) FISTULA CREATION;  Surgeon: Maeola Harman, MD;  Location: [facility] OR;  Service: Vascular;  Laterality: Right;   BIOPSY  02/09/2020   Procedure: BIOPSY;  Surgeon: Lemar Lofty., MD;  Location: [facility] ENDOSCOPY;  Service: Gastroenterology;;   CHOLECYSTECTOMY     COLONOSCOPY WITH  PROPOFOL N/A 02/09/2020   Procedure: COLONOSCOPY WITH PROPOFOL;  Surgeon: Lemar Lofty., MD;  Location: [facility] ENDOSCOPY;  Service: Gastroenterology;  Laterality: N/A;   ESOPHAGOGASTRODUODENOSCOPY (EGD) WITH PROPOFOL N/A 02/09/2020   Procedure: ESOPHAGOGASTRODUODENOSCOPY (EGD) WITH PROPOFOL;  Surgeon: Meridee Score Netty Starring., MD;  Location: [facility] ENDOSCOPY;  Service: Gastroenterology;  Laterality: N/A;   IR FLUORO GUIDE CV LINE RIGHT  12/07/2021   IR US GUIDE VASC ACCESS RIGHT  12/07/2021   POLYPECTOMY  02/09/2020   Procedure: POLYPECTOMY;  Surgeon: Mansouraty, Netty Starring., MD;  Location: [facility] ENDOSCOPY;  Service: Gastroenterology;;   RETINAL DETACHMENT REPAIR W/ SCLERAL BUCKLE LE     Left    Social History   Socioeconomic History   Marital status: Divorced    Spouse name: Not on file   Number of children: Not on file   Years of education: Not on file   Highest education level: Not on file  Occupational History   Not on file  Tobacco Use   Smoking status: Never   Smokeless tobacco: Never  Vaping Use   Vaping Use: Never used  Substance and Sexual Activity   Alcohol use: Not Currently    Comment: occ   Drug use: Never   Sexual activity: Not on file  Other Topics Concern   Not on file  Social History  Narrative   Epworth Sleepiness Scale = 10 (as of 12/01/14)   Social Determinants of Health   Financial Resource Strain: Not on file  Food Insecurity: Not on file  Transportation Needs: Not on file  Physical Activity: Not on file  Stress: Not on file  Social Connections: Not on file  Intimate Partner Violence: Not on file    Family History  Problem Relation Age of Onset   Hypertension Mother    Hyperlipidemia Mother    Heart disease Mother    Kidney disease Mother    Depression Mother    Stomach cancer Father    Healthy Sister        x2   Healthy Brother        x2   Hypertension Maternal Grandmother    Stroke Maternal Grandmother    Heart disease Maternal  Grandmother    Hyperlipidemia Maternal Grandmother    Stroke Maternal Grandfather    Hypertension Maternal Grandfather    Heart disease Maternal Grandfather    Hyperlipidemia Maternal Grandfather    Alzheimer's disease Maternal Grandfather    Cancer - Other Paternal Grandmother    Healthy Daughter        x1   Healthy Son        x2   Diabetes Neg Hx    Heart attack Neg Hx    Sudden death Neg Hx    Colon cancer Neg Hx    Esophageal cancer Neg Hx    Pancreatic cancer Neg Hx    Inflammatory bowel disease Neg Hx    Liver disease Neg Hx    Rectal cancer Neg Hx     Current Outpatient Medications  Medication Sig Dispense Refill   albuterol (VENTOLIN HFA) 108 (90 Base) MCG/ACT inhaler Inhale 1-2 puffs into the lungs every 6 (six) hours as needed for wheezing (cough). 8 g 2   allopurinol (ZYLOPRIM) 300 MG tablet Take 0.5 tablets (150 mg total) by mouth every dialysis (after dialysis). (Patient taking differently: Take 300 mg by mouth 3 (three) times a week.) 15 tablet 0   benzonatate (TESSALON) 100 MG capsule Take 100 mg by mouth as needed for cough.     brimonidine (ALPHAGAN) 0.2 % ophthalmic solution Place 1 drop into the left eye 3 (three) times daily.     carvedilol (COREG) 25 MG tablet Take 0.5 tablets (12.5 mg total) by mouth 2 (two) times daily with a meal. TAKE 2 TABLETS(50 MG) BY MOUTH TWICE DAILY 30 tablet 0   dorzolamide-timolol (COSOPT) 22.3-6.8 MG/ML ophthalmic solution Place 1 drop into both eyes 3 (three) times daily.     Doxercalciferol (HECTOROL IV) as directed. On dialysis days     EQ FIBER SUPPLEMENT PO Take 1 tablet by mouth daily.     ipratropium (ATROVENT) 0.03 % nasal spray Place 2 sprays into both nostrils every 12 (twelve) hours. 30 mL 12   isosorbide mononitrate (IMDUR) 60 MG 24 hr tablet Take 60 mg by mouth daily.     latanoprost (XALATAN) 0.005 % ophthalmic solution Place 1 drop into the left eye at bedtime.     lidocaine-prilocaine (EMLA) cream Apply topically  as directed. Dialysis days     LINZESS 145 MCG CAPS capsule TAKE 1 CAPSULE(145 MCG) BY MOUTH DAILY BEFORE BREAKFAST. GIVEN** 90 capsule 3   ondansetron (ZOFRAN) 4 MG tablet Take 1 tablet (4 mg total) by mouth every 8 (eight) hours as needed for nausea or vomiting. 20 tablet 0   sevelamer carbonate (RENVELA) 800  MG tablet Take 800 mg by mouth 3 (three) times daily with meals.     No current facility-administered medications for this visit.    No Known Allergies   REVIEW OF SYSTEMS:    denotes positive finding,  denotes negative finding Cardiac  Comments:  Chest pain or chest pressure:    Shortness of breath upon exertion:    Short of breath when lying flat:    Irregular heart rhythm:        Vascular    Pain in calf, thigh, or hip brought on by ambulation:    Pain in feet at night that wakes you up from your sleep:     Blood clot in your veins:    Leg swelling:         Pulmonary    Oxygen at home:    Productive cough:     Wheezing:         Neurologic    Sudden weakness in arms or legs:     Sudden numbness in arms or legs:     Sudden onset of difficulty speaking or slurred speech:    Temporary loss of vision in one eye:     Problems with dizziness:         Gastrointestinal    Blood in stool:     Vomited blood:         Genitourinary    Burning when urinating:     Blood in urine:        Psychiatric    Major depression:         Hematologic    Bleeding problems:    Problems with blood clotting too easily:        Skin    Rashes or ulcers:        Constitutional    Fever or chills:      PHYSICAL EXAMINATION:  Vitals:   05/03/22 1055  BP: (!) 172/110  Pulse: (!) 59  Temp: 97.9 F (36.6 C)  TempSrc: Temporal  SpO2: 97%  Weight: (!) 345 lb (156.5 kg)  Height:  (1.727 m)    General:  WDWN in NAD; vital signs documented above Gait: Not observed HENT: WNL, normocephalic Pulmonary: normal non-labored breathing , without Rales, rhonchi,   wheezing Cardiac: regular HR Abdomen: soft, NT, no masses Skin: without rashes Vascular Exam/Pulses: Dopplerable right radial pulse; no palpable pulse or thrill of the radiocephalic fistula; incision well-healed; symmetrical grip strength Extremities: without ischemic changes, without Gangrene , without cellulitis; without open wounds;  Musculoskeletal: no muscle wasting or atrophy  Neurologic: A&O X 3 Psychiatric:  The pt has Normal affect.   Non-Invasive Vascular Imaging:   Occluded right radiocephalic fistula    ASSESSMENT/PLAN:: 43 y.o. male status post right radiocephalic fistula creation  -Based on physical exam and fistula duplex, the right radiocephalic fistula is occluded.  He will continue HD via right IJ [facility] for now.  Plan will be to create a fistula in the right upper arm.  He has adequate basilic and cephalic vein conduits based on vein mapping.  This will be scheduled with the next available surgeon when the patient is ready.  Patient was provided a work note stating he will require an additional surgery.  We will call him in 1 to 2 weeks to schedule surgery assuming he has made the necessary arrangements with his employer.   Emilie Rutter, PA-C [facility] [phone]  Clinic MD:   Randie Heinz

## 2022-05-03 NOTE — H&P (View-Only) (Signed)
Office Note     CC:  follow up Requesting Provider:  Nche, Charlotte Lum, NP  HPI: Frank Coffey is a 42 y.o. (11/01/1980) male who presents status post right radiocephalic fistula creation by Dr. Cain on 02/17/2022.  He is currently dialyzing via right IJ [facility] on a Tuesday Thursday Saturday schedule.  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  He denies steal symptoms in his right hand.  He states the incision is well-healed.  He reports finally having regular bowel movements after anesthesia and narcotic use after fistula surgery.  He attributes this to his medical history of irritable bowel syndrome.  It should also be noted that he has met with a transplant team in the past however at that time he weighed over 400 pounds.  He is now down to 335 pounds and plans to reach back out to the transplant team to potentially be added to the transplant list.   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    IBS (irritable bowel syndrome)    Obesity    OSA (obstructive sleep apnea)    Pneumonia 04/2019   with Covid   Sleep apnea     Past Surgical History:  Procedure Laterality Date   AV FISTULA PLACEMENT Right 02/17/2022   Procedure: RIGHT ARTERIOVENOUS (AV) FISTULA CREATION;  Surgeon: Cain, Brandon Christopher, MD;  Location: [facility] OR;  Service: Vascular;  Laterality: Right;   BIOPSY  02/09/2020   Procedure: BIOPSY;  Surgeon: Mansouraty, Gabriel Jr., MD;  Location: [facility] ENDOSCOPY;  Service: Gastroenterology;;   CHOLECYSTECTOMY     COLONOSCOPY WITH  PROPOFOL N/A 02/09/2020   Procedure: COLONOSCOPY WITH PROPOFOL;  Surgeon: Mansouraty, Gabriel Jr., MD;  Location: [facility] ENDOSCOPY;  Service: Gastroenterology;  Laterality: N/A;   ESOPHAGOGASTRODUODENOSCOPY (EGD) WITH PROPOFOL N/A 02/09/2020   Procedure: ESOPHAGOGASTRODUODENOSCOPY (EGD) WITH PROPOFOL;  Surgeon: Mansouraty, Gabriel Jr., MD;  Location: [facility] ENDOSCOPY;  Service: Gastroenterology;  Laterality: N/A;   IR FLUORO GUIDE CV LINE RIGHT  12/07/2021   IR US GUIDE VASC ACCESS RIGHT  12/07/2021   POLYPECTOMY  02/09/2020   Procedure: POLYPECTOMY;  Surgeon: Mansouraty, Gabriel Jr., MD;  Location: [facility] ENDOSCOPY;  Service: Gastroenterology;;   RETINAL DETACHMENT REPAIR W/ SCLERAL BUCKLE LE     Left    Social History   Socioeconomic History   Marital status: Divorced    Spouse name: Not on file   Number of children: Not on file   Years of education: Not on file   Highest education level: Not on file  Occupational History   Not on file  Tobacco Use   Smoking status: Never   Smokeless tobacco: Never  Vaping Use   Vaping Use: Never used  Substance and Sexual Activity   Alcohol use: Not Currently    Comment: occ   Drug use: Never   Sexual activity: Not on file  Other Topics Concern   Not on file  Social History   Narrative   Epworth Sleepiness Scale = 10 (as of 12/01/14)   Social Determinants of Health   Financial Resource Strain: Not on file  Food Insecurity: Not on file  Transportation Needs: Not on file  Physical Activity: Not on file  Stress: Not on file  Social Connections: Not on file  Intimate Partner Violence: Not on file    Family History  Problem Relation Age of Onset   Hypertension Mother    Hyperlipidemia Mother    Heart disease Mother    Kidney disease Mother    Depression Mother    Stomach cancer Father    Healthy Sister        x2   Healthy Brother        x2   Hypertension Maternal Grandmother    Stroke Maternal Grandmother    Heart disease Maternal  Grandmother    Hyperlipidemia Maternal Grandmother    Stroke Maternal Grandfather    Hypertension Maternal Grandfather    Heart disease Maternal Grandfather    Hyperlipidemia Maternal Grandfather    Alzheimer's disease Maternal Grandfather    Cancer - Other Paternal Grandmother    Healthy Daughter        x1   Healthy Son        x2   Diabetes Neg Hx    Heart attack Neg Hx    Sudden death Neg Hx    Colon cancer Neg Hx    Esophageal cancer Neg Hx    Pancreatic cancer Neg Hx    Inflammatory bowel disease Neg Hx    Liver disease Neg Hx    Rectal cancer Neg Hx     Current Outpatient Medications  Medication Sig Dispense Refill   albuterol (VENTOLIN HFA) 108 (90 Base) MCG/ACT inhaler Inhale 1-2 puffs into the lungs every 6 (six) hours as needed for wheezing (cough). 8 g 2   allopurinol (ZYLOPRIM) 300 MG tablet Take 0.5 tablets (150 mg total) by mouth every dialysis (after dialysis). (Patient taking differently: Take 300 mg by mouth 3 (three) times a week.) 15 tablet 0   benzonatate (TESSALON) 100 MG capsule Take 100 mg by mouth as needed for cough.     brimonidine (ALPHAGAN) 0.2 % ophthalmic solution Place 1 drop into the left eye 3 (three) times daily.     carvedilol (COREG) 25 MG tablet Take 0.5 tablets (12.5 mg total) by mouth 2 (two) times daily with a meal. TAKE 2 TABLETS(50 MG) BY MOUTH TWICE DAILY 30 tablet 0   dorzolamide-timolol (COSOPT) 22.3-6.8 MG/ML ophthalmic solution Place 1 drop into both eyes 3 (three) times daily.     Doxercalciferol (HECTOROL IV) as directed. On dialysis days     EQ FIBER SUPPLEMENT PO Take 1 tablet by mouth daily.     ipratropium (ATROVENT) 0.03 % nasal spray Place 2 sprays into both nostrils every 12 (twelve) hours. 30 mL 12   isosorbide mononitrate (IMDUR) 60 MG 24 hr tablet Take 60 mg by mouth daily.     latanoprost (XALATAN) 0.005 % ophthalmic solution Place 1 drop into the left eye at bedtime.     lidocaine-prilocaine (EMLA) cream Apply topically  as directed. Dialysis days     LINZESS 145 MCG CAPS capsule TAKE 1 CAPSULE(145 MCG) BY MOUTH DAILY BEFORE BREAKFAST. GIVEN** 90 capsule 3   ondansetron (ZOFRAN) 4 MG tablet Take 1 tablet (4 mg total) by mouth every 8 (eight) hours as needed for nausea or vomiting. 20 tablet 0   sevelamer carbonate (RENVELA) 800   MG tablet Take 800 mg by mouth 3 (three) times daily with meals.     No current facility-administered medications for this visit.    No Known Allergies   REVIEW OF SYSTEMS:   [X] denotes positive finding, [ ] denotes negative finding Cardiac  Comments:  Chest pain or chest pressure:    Shortness of breath upon exertion:    Short of breath when lying flat:    Irregular heart rhythm:        Vascular    Pain in calf, thigh, or hip brought on by ambulation:    Pain in feet at night that wakes you up from your sleep:     Blood clot in your veins:    Leg swelling:         Pulmonary    Oxygen at home:    Productive cough:     Wheezing:         Neurologic    Sudden weakness in arms or legs:     Sudden numbness in arms or legs:     Sudden onset of difficulty speaking or slurred speech:    Temporary loss of vision in one eye:     Problems with dizziness:         Gastrointestinal    Blood in stool:     Vomited blood:         Genitourinary    Burning when urinating:     Blood in urine:        Psychiatric    Major depression:         Hematologic    Bleeding problems:    Problems with blood clotting too easily:        Skin    Rashes or ulcers:        Constitutional    Fever or chills:      PHYSICAL EXAMINATION:  Vitals:   05/03/22 1055  BP: (!) 172/110  Pulse: (!) 59  Temp: 97.9 F (36.6 C)  TempSrc: Temporal  SpO2: 97%  Weight: (!) 345 lb (156.5 kg)  Height: 5' 8" (1.727 m)    General:  WDWN in NAD; vital signs documented above Gait: Not observed HENT: WNL, normocephalic Pulmonary: normal non-labored breathing , without Rales, rhonchi,   wheezing Cardiac: regular HR Abdomen: soft, NT, no masses Skin: without rashes Vascular Exam/Pulses: Dopplerable right radial pulse; no palpable pulse or thrill of the radiocephalic fistula; incision well-healed; symmetrical grip strength Extremities: without ischemic changes, without Gangrene , without cellulitis; without open wounds;  Musculoskeletal: no muscle wasting or atrophy  Neurologic: A&O X 3 Psychiatric:  The pt has Normal affect.   Non-Invasive Vascular Imaging:   Occluded right radiocephalic fistula    ASSESSMENT/PLAN:: 42 y.o. male status post right radiocephalic fistula creation  -Based on physical exam and fistula duplex, the right radiocephalic fistula is occluded.  He will continue HD via right IJ TDC for now.  Plan will be to create a fistula in the right upper arm.  He has adequate basilic and cephalic vein conduits based on vein mapping.  This will be scheduled with the next available surgeon when the patient is ready.  Patient was provided a work note stating he will require an additional surgery.  We will call him in 1 to 2 weeks to schedule surgery assuming he has made the necessary arrangements with his employer.   Aria Pickrell, PA-C [facility] [phone]  Clinic MD:   [place]  

## 2022-05-04 ENCOUNTER — Other Ambulatory Visit: Payer: Self-pay

## 2022-05-04 DIAGNOSIS — N186 End stage renal disease: Secondary | ICD-10-CM

## 2022-05-13 ENCOUNTER — Other Ambulatory Visit: Payer: Self-pay | Admitting: Family Medicine

## 2022-05-17 ENCOUNTER — Encounter: Payer: Self-pay | Admitting: Nurse Practitioner

## 2022-05-17 ENCOUNTER — Ambulatory Visit: Payer: Managed Care, Other (non HMO) | Admitting: Nurse Practitioner

## 2022-05-17 VITALS — BP 119/80 | HR 87 | Temp 98.3°F | Resp 16 | Ht 68.0 in | Wt 343.6 lb

## 2022-05-17 DIAGNOSIS — I129 Hypertensive chronic kidney disease with stage 1 through stage 4 chronic kidney disease, or unspecified chronic kidney disease: Secondary | ICD-10-CM

## 2022-05-17 DIAGNOSIS — E790 Hyperuricemia without signs of inflammatory arthritis and tophaceous disease: Secondary | ICD-10-CM

## 2022-05-17 DIAGNOSIS — Z0001 Encounter for general adult medical examination with abnormal findings: Secondary | ICD-10-CM

## 2022-05-17 DIAGNOSIS — Z992 Dependence on renal dialysis: Secondary | ICD-10-CM

## 2022-05-17 DIAGNOSIS — R351 Nocturia: Secondary | ICD-10-CM

## 2022-05-17 DIAGNOSIS — Z0000 Encounter for general adult medical examination without abnormal findings: Secondary | ICD-10-CM | POA: Diagnosis not present

## 2022-05-17 DIAGNOSIS — K5904 Chronic idiopathic constipation: Secondary | ICD-10-CM

## 2022-05-17 DIAGNOSIS — E7800 Pure hypercholesterolemia, unspecified: Secondary | ICD-10-CM

## 2022-05-17 NOTE — Assessment & Plan Note (Addendum)
Dialysis via right upper chest wall perm cath. Failed right forearm fistula Schedule Tues, Thurs, Sat. Phosphate binder and hectorol prescribed He is still able to produce urine No LE edema He is in contact with social worker at dialysis center. 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 He denies any SDH needs at this time

## 2022-05-17 NOTE — Assessment & Plan Note (Signed)
Stable with use of linsezz prn

## 2022-05-17 NOTE — Assessment & Plan Note (Signed)
BP at goal with imdur and coreg BP Readings from Last 3 Encounters:  05/17/22 119/80  05/03/22 (!) 172/110  03/08/22 128/80

## 2022-05-17 NOTE — Progress Notes (Unsigned)
Complete physical exam  Patient: Frank Coffey   DOB: 04-29-1980   42 y.o. Male  MRN: [id number] Visit Date: 05/18/2022  Subjective:    Chief Complaint  Patient presents with   Annual Exam    Not fasting    Frank Coffey is a 42 y.o. male who presents today for a complete physical exam. He reports consuming a  renal diet  diet.  Walking daily  He generally feels well. He reports sleeping fairly well. He does have additional problems to discuss today.  Vision:yes Dental:Yes STD Screen:Yes PSA:Yes  BP Readings from Last 3 Encounters:  05/17/22 119/80  05/03/22 (!) 172/110  03/08/22 128/80   Wt Readings from Last 3 Encounters:  05/17/22 (!) 343 lb 9.6 oz (155.9 kg)  05/03/22 (!) 345 lb (156.5 kg)  04/26/22 (!) 346 lb (156.9 kg)   Most recent fall risk assessment:    05/17/2022    1:15 PM  Fall Risk   Falls in the past year? 0  Number falls in past yr: 0  Injury with Fall? 0  Risk for fall due to : No Fall Risks  Follow up Falls evaluation completed   Depression screen:Yes - No Depression  Most recent depression screenings:    10/17/2021   10:41 AM 07/12/2021    7:18 AM  PHQ 2/9 Scores  PHQ - 2 Score 0 3  PHQ- 9 Score  13   HPI  Gout Denies any gout exacerbation in last 6months with use of allopurinol and diet modification Repeat CMP ad uric acid Maintain allopurinol dose.  Hyperlipidemia, unspecified Repeat lipid panel  Benign hypertension with chronic kidney disease, stage IV (HCC) BP at goal with imdur and coreg BP Readings from Last 3 Encounters:  05/17/22 119/80  05/03/22 (!) 172/110  03/08/22 128/80     ESRD (end stage renal disease) on dialysis [facility]) Dialysis via right upper chest wall perm cath. Failed right forearm fistula Schedule Tues, Thurs, Sat. Phosphate binder and hectorol prescribed He is still able to produce urine No LE edema He is in contact with social worker at dialysis center. 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)    IBS (irritable bowel syndrome)    Obesity    OSA (obstructive sleep apnea)    Pneumonia 04/2019   with Covid   Sleep apnea    Past Surgical History:  Procedure Laterality Date   AV FISTULA PLACEMENT Right 02/17/2022   Procedure: RIGHT ARTERIOVENOUS (AV) FISTULA CREATION;  Surgeon: Maeola Harman, MD;  Location: [facility] OR;  Service: Vascular;  Laterality: Right;   BIOPSY  02/09/2020   Procedure: BIOPSY;  Surgeon: Meridee Score Netty Starring., MD;  Location: [facility] ENDOSCOPY;  Service: Gastroenterology;;   CHOLECYSTECTOMY     COLONOSCOPY WITH PROPOFOL N/A 02/09/2020   Procedure: COLONOSCOPY WITH PROPOFOL;  Surgeon: Lemar Lofty., MD;  Location: [facility] ENDOSCOPY;  Service: Gastroenterology;  Laterality: N/A;   ESOPHAGOGASTRODUODENOSCOPY (EGD) WITH PROPOFOL N/A 02/09/2020   Procedure: ESOPHAGOGASTRODUODENOSCOPY (EGD) WITH PROPOFOL;  Surgeon: Meridee Score Netty Starring., MD;  Location: [facility] ENDOSCOPY;  Service: Gastroenterology;  Laterality: N/A;   EYE SURGERY  01/18/2011   IR FLUORO GUIDE CV LINE RIGHT  12/07/2021   IR US GUIDE VASC ACCESS RIGHT  12/07/2021   POLYPECTOMY  02/09/2020    Procedure: POLYPECTOMY;  Surgeon: Mansouraty, Netty Starring., MD;  Location: [facility] ENDOSCOPY;  Service: Gastroenterology;;   RETINAL DETACHMENT REPAIR W/ SCLERAL BUCKLE LE     Left   Social History   Socioeconomic History   Marital status: Divorced    Spouse name: Not on file   Number of children: Not on file   Years of education: Not on file   Highest education level: Not on file  Occupational History   Not on file  Tobacco Use   Smoking status: Never   Smokeless tobacco: Never  Vaping Use   Vaping Use: Never used  Substance and Sexual Activity   Alcohol use: Not Currently    Comment: occ   Drug use: Never   Sexual activity: Not on file  Other Topics Concern   Not on file  Social History Narrative   Epworth Sleepiness Scale = 10 (as of 12/01/14)   Social Determinants of Health   Financial Resource Strain: Not on file  Food Insecurity: Not on file  Transportation Needs: Not on file  Physical Activity: Not on file  Stress: Not on file  Social Connections: Not on file  Intimate Partner Violence: Not on file   Family Status  Relation Name Status   Mother Baruch Goldmann Pritts Deceased   Father  Alive   Sister  Alive   Brother  Alive   MGM Gwenlyn Fudge Alive   MGF Johan Creveling Deceased   PGM  Deceased   PGF  Alive   Daughter  (Not Specified)   Son  (Not Specified)   Neg Hx  (Not Specified)   Family History  Problem Relation Age of Onset   Hypertension Mother    Hyperlipidemia Mother    Heart disease Mother    Kidney disease Mother    Depression Mother    Stomach cancer Father    Healthy Sister        x2   Healthy Brother        x2   Hypertension Maternal Grandmother    Stroke Maternal Grandmother    Heart disease Maternal Grandmother    Hyperlipidemia Maternal Grandmother    Stroke Maternal Grandfather    Hypertension Maternal Grandfather    Heart disease Maternal Grandfather    Hyperlipidemia Maternal Grandfather    Alzheimer's disease Maternal  Grandfather    Cancer - Other Paternal Grandmother    Healthy Daughter        x1   Healthy Son        x2   Diabetes Neg Hx    Heart attack Neg Hx    Sudden death Neg Hx    Colon cancer Neg Hx    Esophageal cancer Neg Hx    Pancreatic cancer Neg Hx    Inflammatory bowel  disease Neg Hx    Liver disease Neg Hx    Rectal cancer Neg Hx    No Known Allergies  Patient Care Team: Gao Mitnick, Bonna Gains, NP as PCP - General (Internal Medicine) Christell Constant, MD as PCP - Cardiology (Cardiology) Karie Soda, MD as Consulting Physician (General Surgery) Chilton Si, MD as Attending Physician (Cardiology) Deterding, Fayrene Fearing, MD as Consulting Physician (Nephrology)   Medications: Outpatient Medications Prior to Visit  Medication Sig   albuterol (VENTOLIN HFA) 108 (90 Base) MCG/ACT inhaler Inhale 1-2 puffs into the lungs every 6 (six) hours as needed for wheezing (cough).   allopurinol (ZYLOPRIM) 300 MG tablet Take 0.5 tablets (150 mg total) by mouth every dialysis (after dialysis). (Patient taking differently: Take 300 mg by mouth 3 (three) times a week.)   brimonidine (ALPHAGAN) 0.2 % ophthalmic solution Place 1 drop into the left eye 3 (three) times daily.   carvedilol (COREG) 25 MG tablet Take 0.5 tablets (12.5 mg total) by mouth 2 (two) times daily with a meal. TAKE 2 TABLETS(50 MG) BY MOUTH TWICE DAILY   dorzolamide-timolol (COSOPT) 22.3-6.8 MG/ML ophthalmic solution Place 1 drop into both eyes 3 (three) times daily.   Doxercalciferol (HECTOROL IV) as directed. On dialysis days   EQ FIBER SUPPLEMENT PO Take 1 tablet by mouth daily.   ipratropium (ATROVENT) 0.03 % nasal spray Place 2 sprays into both nostrils every 12 (twelve) hours.   isosorbide mononitrate (IMDUR) 60 MG 24 hr tablet Take 60 mg by mouth daily.   latanoprost (XALATAN) 0.005 % ophthalmic solution Place 1 drop into the left eye at bedtime.   lidocaine-prilocaine (EMLA) cream Apply topically as directed. Dialysis  days   LINZESS 145 MCG CAPS capsule TAKE 1 CAPSULE(145 MCG) BY MOUTH DAILY BEFORE BREAKFAST. GIVEN**   [DISCONTINUED] ondansetron (ZOFRAN) 4 MG tablet Take 1 tablet (4 mg total) by mouth every 8 (eight) hours as needed for nausea or vomiting.   [DISCONTINUED] sevelamer carbonate (RENVELA) 800 MG tablet Take 800 mg by mouth 3 (three) times daily with meals.   [DISCONTINUED] WEGOVY 0.25 MG/0.5ML SOAJ Inject into the skin.   [DISCONTINUED] benzonatate (TESSALON) 100 MG capsule Take 100 mg by mouth as needed for cough. (Patient not taking: Reported on 05/17/2022)   No facility-administered medications prior to visit.    Review of Systems  Constitutional:  Negative for activity change, appetite change and unexpected weight change.  Respiratory: Negative.    Cardiovascular: Negative.   Gastrointestinal: Negative.   Endocrine: Negative for cold intolerance and heat intolerance.  Genitourinary: Negative.   Musculoskeletal: Negative.   Skin: Negative.   Neurological: Negative.   Hematological: Negative.   Psychiatric/Behavioral:  Negative for behavioral problems, decreased concentration, dysphoric mood, hallucinations, self-injury, sleep disturbance and suicidal ideas. The patient is not nervous/anxious.        Objective:  BP 119/80 (BP Location: Right Arm, Patient Position: Sitting, Cuff Size: Large)   Pulse 87   Temp 98.3 F (36.8 C) (Temporal)   Resp 16   Ht 5\' 8"  (1.727 m)   Wt (!) 343 lb 9.6 oz (155.9 kg)   SpO2 94%   BMI 52.24 kg/m     Physical Exam Vitals and nursing note reviewed.  Constitutional:      General: He is not in acute distress. HENT:     Right Ear: Tympanic membrane, ear canal and external ear normal.     Left Ear: Tympanic membrane, ear canal and external ear normal.     Nose: Nose  normal.  Eyes:     Extraocular Movements: Extraocular movements intact.     Conjunctiva/sclera: Conjunctivae normal.     Pupils: Pupils are equal, round, and reactive to light.   Neck:     Thyroid: No thyroid mass, thyromegaly or thyroid tenderness.  Cardiovascular:     Rate and Rhythm: Normal rate and regular rhythm.     Pulses: Normal pulses.     Heart sounds: Normal heart sounds.  Pulmonary:     Effort: Pulmonary effort is normal.     Breath sounds: Normal breath sounds.  Abdominal:     General: Bowel sounds are normal.     Palpations: Abdomen is soft.  Musculoskeletal:        General: Normal range of motion.     Cervical back: Normal range of motion and neck supple.     Right lower leg: No edema.     Left lower leg: No edema.  Lymphadenopathy:     Cervical: No cervical adenopathy.  Skin:    General: Skin is warm and dry.  Neurological:     Mental Status: He is alert and oriented to person, place, and time.     Cranial Nerves: No cranial nerve deficit.  Psychiatric:        Mood and Affect: Mood normal.        Behavior: Behavior normal.        Thought Content: Thought content normal.      No results found for any visits on 05/17/22.    Assessment & Plan:    Routine Health Maintenance and Physical Exam  Immunization History  Administered Date(s) Administered   Hepb-cpg 02/28/2022   Influenza,inj,Quad PF,6+ Mos 12/18/2012, 12/19/2013, 02/07/2016, 11/21/2017, 10/14/2020, 10/17/2021   Influenza-Unspecified 11/07/2018   PFIZER(Purple Top)SARS-COV-2 Vaccination 04/17/2019, 11/14/2019   Pneumococcal Polysaccharide-23 07/13/2014   Tdap 01/17/2007, 10/14/2020   Health Maintenance  Topic Date Due   Hepatitis C Screening  Never done   COVID-19 Vaccine (3 - 2023-24 season) 09/16/2021   INFLUENZA VACCINE  08/17/2022   DTaP/Tdap/Td (3 - Td or Tdap) 10/15/2030   HIV Screening  Completed   HPV VACCINES  Aged Out   Discussed health benefits of physical activity, and encouraged him to engage in regular exercise appropriate for his age and condition.  Problem List Items Addressed This Visit       Cardiovascular and Mediastinum   Benign  hypertension with chronic kidney disease, stage IV (HCC)    BP at goal with imdur and coreg BP Readings from Last 3 Encounters:  05/17/22 119/80  05/03/22 (!) 172/110  03/08/22 128/80           Digestive   Chronic idiopathic constipation    Stable with use of linsezz prn        Genitourinary   ESRD (end stage renal disease) on dialysis [facility])    Dialysis via right upper chest wall perm cath. Failed right forearm fistula Schedule Tues, Thurs, Sat. Phosphate binder and hectorol prescribed He is still able to produce urine No LE edema He is in contact with social worker at dialysis center. 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 He denies any SDH needs at this time        Other   Gout    Denies any gout exacerbation in last 6months with use of allopurinol and diet modification Repeat CMP ad uric acid Maintain allopurinol dose.  Hyperlipidemia, unspecified    Repeat lipid panel      Relevant Orders   Lipid panel   Other Visit Diagnoses     Encounter for preventative adult health care exam with abnormal findings    -  Primary   Relevant Orders   Comprehensive metabolic panel   Nocturia       Relevant Orders   PSA      Return in about 6 months (around 11/17/2022) for HTN, hyperlipidemia (fasting).     Alysia Penna, NP

## 2022-05-17 NOTE — Assessment & Plan Note (Signed)
Repeat lipid panel ?

## 2022-05-17 NOTE — Patient Instructions (Signed)
Schedule fasting lab appt. Need to be fasting 8hrs prior to blood draw. Ok to drink water and take BP meds. Continue Heart healthy diet and daily exercise. Maintain current medications.  Preventive Care 63-44 Years Old, Male Preventive care refers to lifestyle choices and visits with your health care provider that can promote health and wellness. Preventive care visits are also called wellness exams. What can I expect for my preventive care visit? Counseling During your preventive care visit, your health care provider may ask about your: Medical history, including: Past medical problems. Family medical history. Current health, including: Emotional well-being. Home life and relationship well-being. Sexual activity. Lifestyle, including: Alcohol, nicotine or tobacco, and drug use. Access to firearms. Diet, exercise, and sleep habits. Safety issues such as seatbelt and bike helmet use. Sunscreen use. Work and work Astronomer. Physical exam Your health care provider will check your: Height and weight. These may be used to calculate your BMI (body mass index). BMI is a measurement that tells if you are at a healthy weight. Waist circumference. This measures the distance around your waistline. This measurement also tells if you are at a healthy weight and may help predict your risk of certain diseases, such as type 2 diabetes and high blood pressure. Heart rate and blood pressure. Body temperature. Skin for abnormal spots. What immunizations do I need?  Vaccines are usually given at various ages, according to a schedule. Your health care provider will recommend vaccines for you based on your age, medical history, and lifestyle or other factors, such as travel or where you work. What tests do I need? Screening Your health care provider may recommend screening tests for certain conditions. This may include: Lipid and cholesterol levels. Diabetes screening. This is done by checking your  blood sugar (glucose) after you have not eaten for a while (fasting). Hepatitis B test. Hepatitis C test. HIV (human immunodeficiency virus) test. STI (sexually transmitted infection) testing, if you are at risk. Lung cancer screening. Prostate cancer screening. Colorectal cancer screening. Talk with your health care provider about your test results, treatment options, and if necessary, the need for more tests. Follow these instructions at home: Eating and drinking  Eat a diet that includes fresh fruits and vegetables, whole grains, lean protein, and low-fat dairy products. Take vitamin and mineral supplements as recommended by your health care provider. Do not drink alcohol if your health care provider tells you not to drink. If you drink alcohol: Limit how much you have to 0-2 drinks a day. Know how much alcohol is in your drink. In the [place] one drink equals one 12 oz bottle of beer (355 mL), one 5 oz glass of wine (148 mL), or one 1 oz glass of hard liquor (44 mL). Lifestyle Brush your teeth every morning and night with fluoride toothpaste. Floss one time each day. Exercise for at least 30 minutes 5 or more days each week. Do not use any products that contain nicotine or tobacco. These products include cigarettes, chewing tobacco, and vaping devices, such as e-cigarettes. If you need help quitting, ask your health care provider. Do not use drugs. If you are sexually active, practice safe sex. Use a condom or other form of protection to prevent STIs. Take aspirin only as told by your health care provider. Make sure that you understand how much to take and what form to take. Work with your health care provider to find out whether it is safe and beneficial for you to take aspirin daily. Find  healthy ways to manage stress, such as: Meditation, yoga, or listening to music. Journaling. Talking to a trusted person. Spending time with friends and family. Minimize exposure to UV radiation  to reduce your risk of skin cancer. Safety Always wear your seat belt while driving or riding in a vehicle. Do not drive: If you have been drinking alcohol. Do not ride with someone who has been drinking. When you are tired or distracted. While texting. If you have been using any mind-altering substances or drugs. Wear a helmet and other protective equipment during sports activities. If you have firearms in your house, make sure you follow all gun safety procedures. What's next? Go to your health care provider once a year for an annual wellness visit. Ask your health care provider how often you should have your eyes and teeth checked. Stay up to date on all vaccines. This information is not intended to replace advice given to you by your health care provider. Make sure you discuss any questions you have with your health care provider. Document Revised: 06/30/2020 Document Reviewed: 06/30/2020 Elsevier Patient Education  2023 [employer].

## 2022-05-17 NOTE — Assessment & Plan Note (Signed)
Denies any gout exacerbation in last 6months with use of allopurinol and diet modification Repeat CMP ad uric acid Maintain allopurinol dose.

## 2022-05-17 NOTE — Assessment & Plan Note (Signed)
>>  ASSESSMENT AND PLAN FOR BENIGN HYPERTENSION WITH CHRONIC KIDNEY DISEASE, STAGE IV (HCC) WRITTEN ON 05/17/2022  1:48 PM BY Loreen Bankson LUM, NP  BP at goal with imdur and coreg BP Readings from Last 3 Encounters:  05/17/22 119/80  05/03/22 (!) 172/110  03/08/22 128/80

## 2022-05-18 ENCOUNTER — Encounter: Payer: Self-pay | Admitting: Nurse Practitioner

## 2022-05-24 ENCOUNTER — Encounter (HOSPITAL_COMMUNITY): Payer: Self-pay | Admitting: Vascular Surgery

## 2022-05-24 ENCOUNTER — Other Ambulatory Visit: Payer: Self-pay

## 2022-05-24 NOTE — Progress Notes (Signed)
SDW call  Patient was given pre-op instructions over the phone. Patient verbalized understanding of instructions provided.    PCP - Alysia Penna, NP Cardiologist - Dr. Riley Lam Pulmonary: Denies Nephrology: Dr. Allena Katz, [facility]   PPM/ICD - Denies  Chest x-ray - 11/21/20223 EKG -  12/09/2021 Stress Test - 04/28/2022 ECHO - 11/09/2021 Cardiac Cath -   Sleep Study/sleep apnea/CPAP: Diagnosed with sleep apnea, does not wear his CPAP  Non-diabetic  Blood Thinner Instructions: Denies Aspirin Instructions: Denies   ERAS Protcol - No, NPO PRE-SURGERY Ensure or G2-    COVID TEST- n/a    Anesthesia review: Yes.  CHR, HTN, OSA, CKD stage III, morbid obesity   Patient denies shortness of breath, fever, cough and chest pain over the phone call  Your procedure is scheduled on Friday May 26, 2022  Report to [facility] Main Entrance "A" at 0700 A.M., then check in with the Admitting office.  Call this number if you have problems the morning of surgery:  [phone]   If you have any questions prior to your surgery date call [phone]: Open Monday-Friday 8am-4pm If you experience any cold or flu symptoms such as cough, fever, chills, shortness of breath, etc. between now and your scheduled surgery, please notify us at the above number     Remember:  Do not eat or drink after midnight the night before your surgery   Take these medicines the morning of surgery with A SIP OF WATER:  Alphagan eye drops, carvedillol, cospot eye drops, atrovent, isosorbide,   As needed: Albuterol  As of today, STOP taking any Aspirin (unless otherwise instructed by your surgeon) Aleve, Naproxen, Ibuprofen, Motrin, Advil, Goody's, BC's, all herbal medications, fish oil, and all vitamins.

## 2022-05-25 NOTE — Anesthesia Preprocedure Evaluation (Addendum)
Anesthesia Evaluation  Patient identified by MRN, date of birth, ID band Patient awake    Reviewed: Allergy & Precautions, H&P , NPO status , Patient's Chart, lab work & pertinent test results, reviewed documented beta blocker date and time   Airway Mallampati: II  TM Distance: >3 FB Neck ROM: Full    Dental no notable dental hx. (+) Teeth Intact, Dental Advisory Given   Pulmonary sleep apnea    Pulmonary exam normal breath sounds clear to auscultation       Cardiovascular hypertension, On Medications and On Home Beta Blockers +CHF  + dysrhythmias  Rhythm:Regular Rate:Normal  Nuclear stress test 04/28/2022:   Findings are consistent with no ischemia. The study is intermediate risk.   No ST deviation was noted.   LV perfusion is normal.   Left ventricular function is abnormal. Global function is moderately reduced. End diastolic cavity size is normal.   Prior study not available for comparison.  Intermediate risk study due to moderate global reduction in left ventricular systolic function.  Homogeneous perfusion pattern. No ischemia is seen. Consider nonischemic cardiomyopathy.   Echo 11/08/2021: IMPRESSIONS  1. Left ventricular ejection fraction, by estimation, is 35 to 40%. The  left ventricle has moderately decreased function. The left ventricle  demonstrates global hypokinesis. Left ventricular diastolic parameters are  consistent with Grade I diastolic  dysfunction (impaired relaxation).  2. Right ventricular systolic function is normal. The right ventricular  size is normal.  3. The mitral valve is normal in structure. No evidence of mitral valve  regurgitation. No evidence of mitral stenosis.  4. The aortic valve is normal in structure. Aortic valve regurgitation is  not visualized. No aortic stenosis is present.  5. Aortic dilatation noted. There is mild dilatation of the ascending  aorta, measuring 43  mm.  6. The inferior vena cava is dilated in size with >50% respiratory  variability, suggesting right atrial pressure of 8 mmHg.  - Comparison(s): Prior images reviewed side by side. 02/18/20 EF 35%.     Neuro/Psych  Headaches   Depression       GI/Hepatic Neg liver ROS,GERD  ,,  Endo/Other  negative endocrine ROS    Renal/GU ESRF and DialysisRenal disease  negative genitourinary   Musculoskeletal   Abdominal   Peds  Hematology  (+) Blood dyscrasia, anemia   Anesthesia Other Findings   Reproductive/Obstetrics negative OB ROS                             Anesthesia Physical Anesthesia Plan  ASA: 3  Anesthesia Plan: MAC and Regional   Post-op Pain Management: Tylenol PO (pre-op)*   Induction: Intravenous  PONV Risk Score and Plan: 1 and Propofol infusion, Midazolam and Ondansetron  Airway Management Planned: Natural Airway and Simple Face Mask  Additional Equipment:   Intra-op Plan:   Post-operative Plan:   Informed Consent: I have reviewed the patients History and Physical, chart, labs and discussed the procedure including the risks, benefits and alternatives for the proposed anesthesia with the patient or authorized representative who has indicated his/her understanding and acceptance.     Dental advisory given  Plan Discussed with: CRNA  Anesthesia Plan Comments: (PAT note written 05/25/2022 by Shonna Chock, PA-C.  )       Anesthesia Quick Evaluation

## 2022-05-25 NOTE — Progress Notes (Signed)
Anesthesia Chart Review: Frank Coffey  Case: [id number] Date/Time: 05/26/22 0907   Procedure: RIGHT ARM ARTERIOVENOUS (AV) FISTULA VERSUS ARTERIOVENOUS GRAFT CREATION (Right)   Anesthesia type: Choice   Pre-op diagnosis: ESRD   [facility]: [facility] ROOM 12 / [facility]   Surgeons: Maeola Harman, MD       DISCUSSION: Patient is a 42 year old male scheduled for the above procedure. Right radiocephalic AVF occluded as of 05/03/22, so new access planned. As of 05/03/22, he was using a right IJ [facility] for HD.   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  Last cardiology visit was on 03/08/22 with Dr. Izora Ribas.  He has a history of dilated cardiomyopathy dating back to ~ 2005. 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 He got re-established with Dr. Chilton Si [facility] in 11/2014 for volume overload and noted to have recurrent LV dysfunction with EF 30% with diffuse hypokinesis. He had underlying CKD and was treated medically and had intermittent follow-up through Fall 2019. He got re-established with Dr. Izora Ribas in January 2022. Last LVEF 35-40% by 11/08/21 echo. 04/28/22 Nuclear stress test was non-ischemic. Volume managed by hemodialysis. Coreg decreased due to hypotension. Follow-up in 6-12 months planned.   He is for labs and anesthesia team evaluation on the day of surgery.   VS: Ht 5\' 9"  (1.753 m)   Wt (!) 150.6 kg   BMI 49.03 kg/m  BP Readings from Last 3 Encounters:  05/17/22 119/80  05/03/22 (!) 172/110  03/08/22 128/80   Pulse Readings from Last 3 Encounters:  05/17/22 87  05/03/22 (!) 59  03/08/22 94      PROVIDERS: Nche, Bonna Gains, NP is PCP  Riley Lam, MD is cardiologist Zetta Bills, MD is nephrologist   LABS: For day of surgery. As of 02/17/22, H/H 12.9/38.0. A1c 5.9% 07/12/21.    IMAGES: CXR 12/06/21: FINDINGS: The heart size and mediastinal contours are within normal limits. Both lungs are clear. The visualized skeletal structures are unremarkable. IMPRESSION: No active cardiopulmonary disease.  EKG: 12/08/2021:  Sinus bradycardia at 52 bpm 1st degree A-V block Incomplete left bundle branch block T wave abnormality, consider lateral ischemia Confirmed by Steffanie Dunn [phone]) on 12/09/2021 9:15:22 PM   CV: Nuclear stress test 04/28/2022:   Findings are consistent with no ischemia. The study is intermediate risk.   No ST deviation was noted.   LV perfusion is normal.   Left ventricular function is abnormal. Global function is moderately reduced. End diastolic cavity size is normal.   Prior study not available for comparison.   Intermediate risk study due to moderate global reduction in left ventricular systolic function.  Homogeneous perfusion pattern. No ischemia is seen. Consider nonischemic cardiomyopathy.    Echo 11/08/2021: IMPRESSIONS   1. Left ventricular ejection fraction, by estimation, is 35 to 40%. The  left ventricle has moderately decreased function. The left ventricle  demonstrates global hypokinesis. Left ventricular diastolic parameters are  consistent with Grade I diastolic  dysfunction (impaired relaxation).   2. Right ventricular systolic function is normal. The right ventricular  size is normal.   3. The mitral valve is normal in structure. No evidence of mitral  valve  regurgitation. No evidence of mitral stenosis.   4. The aortic valve is normal in structure. Aortic valve regurgitation is  not visualized. No aortic stenosis is present.   5. Aortic dilatation noted. There is mild dilatation of the ascending  aorta,  measuring 43 mm.   6. The inferior vena cava is dilated in size with >50% respiratory  variability, suggesting right atrial pressure of 8 mmHg.  - Comparison(s): Prior images reviewed side by side. 02/18/20 EF 35%.    Cardiac event monitor 11/28/2017 - 12/17/2017: Quality: Fair.  Baseline artifact. Predominant rhythm: sinus rhythm Average heart rate: 81 bpm Max heart rate: 114 bpm Min heart rate: 49 bpm One episode of second degree heart block, Mobitz type I No other arrhythmias noted.     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)    IBS (irritable bowel syndrome)    Obesity    OSA (obstructive sleep apnea)    Pneumonia 04/2019   with Covid   Sleep apnea     Past Surgical History:  Procedure Laterality Date   AV FISTULA PLACEMENT Right 02/17/2022   Procedure: RIGHT ARTERIOVENOUS (AV) FISTULA CREATION;  Surgeon: Maeola Harman, MD;  Location: [facility] OR;  Service: Vascular;  Laterality: Right;   BIOPSY  02/09/2020   Procedure: BIOPSY;  Surgeon: Lemar Lofty., MD;  Location: [facility] ENDOSCOPY;  Service: Gastroenterology;;   CHOLECYSTECTOMY     COLONOSCOPY WITH PROPOFOL N/A 02/09/2020   Procedure: COLONOSCOPY WITH PROPOFOL;  Surgeon: Lemar Lofty., MD;  Location: [facility] ENDOSCOPY;  Service: Gastroenterology;  Laterality: N/A;   ESOPHAGOGASTRODUODENOSCOPY (EGD) WITH PROPOFOL  N/A 02/09/2020   Procedure: ESOPHAGOGASTRODUODENOSCOPY (EGD) WITH PROPOFOL;  Surgeon: Meridee Score Netty Starring., MD;  Location: [facility] ENDOSCOPY;  Service: Gastroenterology;  Laterality: N/A;   EYE SURGERY  01/18/2011   IR FLUORO GUIDE CV LINE RIGHT  12/07/2021   IR US GUIDE VASC ACCESS RIGHT  12/07/2021   POLYPECTOMY  02/09/2020   Procedure: POLYPECTOMY;  Surgeon: Mansouraty, Netty Starring., MD;  Location: [facility] ENDOSCOPY;  Service: Gastroenterology;;   RETINAL DETACHMENT REPAIR W/ SCLERAL BUCKLE LE     Left    MEDICATIONS: No current facility-administered medications for this encounter.    albuterol (VENTOLIN HFA) 108 (90 Base) MCG/ACT inhaler   allopurinol (ZYLOPRIM) 300 MG tablet   dorzolamide-timolol (COSOPT) 22.3-6.8 MG/ML ophthalmic solution   isosorbide mononitrate (IMDUR) 60 MG 24 hr tablet   latanoprost (XALATAN) 0.005 % ophthalmic solution   latanoprost (XALATAN) 0.005 % ophthalmic solution   lidocaine-prilocaine (EMLA) cream   LINZESS 145 MCG CAPS capsule   sevelamer carbonate (RENVELA) 800 MG tablet   carvedilol (COREG) 25 MG tablet   Doxercalciferol (HECTOROL IV)   ipratropium (ATROVENT) 0.03 % nasal spray     Shonna Chock, PA-C Surgical Short Stay/Anesthesiology [facility]  Phone [phone] [facility] Phone [phone] 05/25/2022 11:16 AM

## 2022-05-26 ENCOUNTER — Encounter (HOSPITAL_COMMUNITY)
Admission: RE | Disposition: A | Discharge status: Home / Self Care | Payer: Self-pay | Source: Ambulatory Visit | Attending: Vascular Surgery

## 2022-05-26 ENCOUNTER — Encounter (HOSPITAL_COMMUNITY): Payer: Self-pay | Admitting: Vascular Surgery

## 2022-05-26 ENCOUNTER — Ambulatory Visit (HOSPITAL_COMMUNITY)
Admission: RE | Admit: 2022-05-26 | Discharge: 2022-05-26 | Disposition: A | Discharge status: Home / Self Care | Payer: Managed Care, Other (non HMO) | Source: Ambulatory Visit | Attending: Vascular Surgery | Admitting: Vascular Surgery

## 2022-05-26 ENCOUNTER — Other Ambulatory Visit: Payer: Self-pay

## 2022-05-26 ENCOUNTER — Ambulatory Visit (HOSPITAL_COMMUNITY): Payer: Managed Care, Other (non HMO) | Admitting: Vascular Surgery

## 2022-05-26 DIAGNOSIS — N186 End stage renal disease: Secondary | ICD-10-CM | POA: Diagnosis not present

## 2022-05-26 DIAGNOSIS — I132 Hypertensive heart and chronic kidney disease with heart failure and with stage 5 chronic kidney disease, or end stage renal disease: Secondary | ICD-10-CM

## 2022-05-26 DIAGNOSIS — Z992 Dependence on renal dialysis: Secondary | ICD-10-CM | POA: Insufficient documentation

## 2022-05-26 DIAGNOSIS — I440 Atrioventricular block, first degree: Secondary | ICD-10-CM | POA: Insufficient documentation

## 2022-05-26 DIAGNOSIS — N185 Chronic kidney disease, stage 5: Secondary | ICD-10-CM | POA: Diagnosis not present

## 2022-05-26 DIAGNOSIS — K219 Gastro-esophageal reflux disease without esophagitis: Secondary | ICD-10-CM | POA: Diagnosis not present

## 2022-05-26 DIAGNOSIS — I428 Other cardiomyopathies: Secondary | ICD-10-CM | POA: Diagnosis not present

## 2022-05-26 DIAGNOSIS — E785 Hyperlipidemia, unspecified: Secondary | ICD-10-CM | POA: Diagnosis not present

## 2022-05-26 DIAGNOSIS — K589 Irritable bowel syndrome without diarrhea: Secondary | ICD-10-CM | POA: Diagnosis not present

## 2022-05-26 DIAGNOSIS — E669 Obesity, unspecified: Secondary | ICD-10-CM | POA: Diagnosis not present

## 2022-05-26 DIAGNOSIS — Z79899 Other long term (current) drug therapy: Secondary | ICD-10-CM | POA: Insufficient documentation

## 2022-05-26 DIAGNOSIS — I5022 Chronic systolic (congestive) heart failure: Secondary | ICD-10-CM | POA: Insufficient documentation

## 2022-05-26 DIAGNOSIS — D631 Anemia in chronic kidney disease: Secondary | ICD-10-CM

## 2022-05-26 DIAGNOSIS — G4733 Obstructive sleep apnea (adult) (pediatric): Secondary | ICD-10-CM | POA: Insufficient documentation

## 2022-05-26 DIAGNOSIS — Z6841 Body Mass Index (BMI) 40.0 and over, adult: Secondary | ICD-10-CM | POA: Diagnosis not present

## 2022-05-26 DIAGNOSIS — I509 Heart failure, unspecified: Secondary | ICD-10-CM | POA: Diagnosis not present

## 2022-05-26 HISTORY — PX: AV FISTULA PLACEMENT: SHX1204

## 2022-05-26 LAB — POCT I-STAT, CHEM 8
BUN: 33 mg/dL — ABNORMAL HIGH (ref 6–20)
Calcium, Ion: 1.05 mmol/L — ABNORMAL LOW (ref 1.15–1.40)
Chloride: 96 mmol/L — ABNORMAL LOW (ref 98–111)
Creatinine, Ser: 11.4 mg/dL — ABNORMAL HIGH (ref 0.61–1.24)
Glucose, Bld: 92 mg/dL (ref 70–99)
HCT: 40 % (ref 39.0–52.0)
Hemoglobin: 13.6 g/dL (ref 13.0–17.0)
Potassium: 4.2 mmol/L (ref 3.5–5.1)
Sodium: 138 mmol/L (ref 135–145)
TCO2: 30 mmol/L (ref 22–32)

## 2022-05-26 SURGERY — ARTERIOVENOUS (AV) FISTULA CREATION
Anesthesia: Monitor Anesthesia Care | Site: Arm Upper | Laterality: Right

## 2022-05-26 MED ORDER — CHLORHEXIDINE GLUCONATE 0.12 % MT SOLN
15.0000 mL | Freq: Once | OROMUCOSAL | Status: AC
  Administered 2022-05-26: 15 mL via OROMUCOSAL
  Filled 2022-05-26: qty 15

## 2022-05-26 MED ORDER — HEPARIN 6000 UNIT IRRIGATION SOLUTION
Status: DC | PRN
  Administered 2022-05-26: 1

## 2022-05-26 MED ORDER — FENTANYL CITRATE (PF) 100 MCG/2ML IJ SOLN
INTRAMUSCULAR | Status: AC
  Administered 2022-05-26: 100 ug via INTRAVENOUS
  Filled 2022-05-26: qty 2

## 2022-05-26 MED ORDER — SODIUM CHLORIDE 0.9 % IV SOLN: INTRAVENOUS | Status: DC

## 2022-05-26 MED ORDER — MIDAZOLAM HCL 2 MG/2ML IJ SOLN
INTRAMUSCULAR | Status: AC
  Administered 2022-05-26: 2 mg via INTRAVENOUS
  Filled 2022-05-26: qty 2

## 2022-05-26 MED ORDER — FENTANYL CITRATE (PF) 100 MCG/2ML IJ SOLN: 100.0000 ug | Freq: Once | INTRAMUSCULAR | Status: AC

## 2022-05-26 MED ORDER — HYDROCODONE-ACETAMINOPHEN 5-325 MG PO TABS: 1.0000 | ORAL_TABLET | Freq: Four times a day (QID) | ORAL | 0 refills | Status: DC | PRN

## 2022-05-26 MED ORDER — BUPIVACAINE HCL (PF) 0.5 % IJ SOLN
INTRAMUSCULAR | Status: DC | PRN
  Administered 2022-05-26: 30 mL via PERINEURAL

## 2022-05-26 MED ORDER — DEXMEDETOMIDINE HCL IN NACL 80 MCG/20ML IV SOLN
INTRAVENOUS | Status: DC | PRN
  Administered 2022-05-26: 4 ug via INTRAVENOUS
  Administered 2022-05-26: 8 ug via INTRAVENOUS

## 2022-05-26 MED ORDER — PROPOFOL 10 MG/ML IV BOLUS
INTRAVENOUS | Status: AC
  Filled 2022-05-26: qty 20

## 2022-05-26 MED ORDER — STERILE WATER FOR IRRIGATION IR SOLN
Status: DC | PRN
  Administered 2022-05-26: 1000 mL

## 2022-05-26 MED ORDER — CHLORHEXIDINE GLUCONATE 4 % EX SOLN: 60.0000 mL | Freq: Once | CUTANEOUS | Status: DC

## 2022-05-26 MED ORDER — DEXMEDETOMIDINE HCL IN NACL 400 MCG/100ML IV SOLN
INTRAVENOUS | Status: DC | PRN
  Administered 2022-05-26: .7 ug/kg/h via INTRAVENOUS

## 2022-05-26 MED ORDER — HYDROMORPHONE HCL 1 MG/ML IJ SOLN: 0.2500 mg | INTRAMUSCULAR | Status: DC | PRN

## 2022-05-26 MED ORDER — CEFAZOLIN IN SODIUM CHLORIDE 3-0.9 GM/100ML-% IV SOLN
3.0000 g | INTRAVENOUS | Status: AC
  Administered 2022-05-26: 3 g via INTRAVENOUS
  Filled 2022-05-26: qty 100

## 2022-05-26 MED ORDER — MIDAZOLAM HCL 2 MG/2ML IJ SOLN: 2.0000 mg | Freq: Once | INTRAMUSCULAR | Status: AC

## 2022-05-26 MED ORDER — ORAL CARE MOUTH RINSE: 15.0000 mL | Freq: Once | OROMUCOSAL | Status: AC

## 2022-05-26 MED ORDER — 0.9 % SODIUM CHLORIDE (POUR BTL) OPTIME
TOPICAL | Status: DC | PRN
  Administered 2022-05-26: 1000 mL

## 2022-05-26 MED ORDER — HEPARIN 6000 UNIT IRRIGATION SOLUTION
Status: AC
  Filled 2022-05-26: qty 500

## 2022-05-26 MED ORDER — ACETAMINOPHEN 500 MG PO TABS
1000.0000 mg | ORAL_TABLET | Freq: Once | ORAL | Status: AC
  Administered 2022-05-26: 1000 mg via ORAL
  Filled 2022-05-26: qty 2

## 2022-05-26 SURGICAL SUPPLY — 31 items
ADH SKN CLS APL DERMABOND .7 (GAUZE/BANDAGES/DRESSINGS) ×1
ARMBAND PINK RESTRICT EXTREMIT (MISCELLANEOUS) ×1 IMPLANT
BAG COUNTER SPONGE SURGICOUNT (BAG) ×1 IMPLANT
BAG SPNG CNTER NS LX DISP (BAG) ×1
CANISTER SUCT 3000ML PPV (MISCELLANEOUS) ×1 IMPLANT
CLIP LIGATING EXTRA MED SLVR (CLIP) ×1 IMPLANT
CLIP LIGATING EXTRA SM BLUE (MISCELLANEOUS) ×1 IMPLANT
COVER PROBE W GEL 5X96 (DRAPES) IMPLANT
DERMABOND ADVANCED .7 DNX12 (GAUZE/BANDAGES/DRESSINGS) ×1 IMPLANT
ELECT REM PT RETURN 9FT ADLT (ELECTROSURGICAL) ×1
ELECTRODE REM PT RTRN 9FT ADLT (ELECTROSURGICAL) ×1 IMPLANT
GLOVE BIO SURGEON STRL SZ7.5 (GLOVE) ×1 IMPLANT
GOWN STRL REUS W/ TWL LRG LVL3 (GOWN DISPOSABLE) ×2 IMPLANT
GOWN STRL REUS W/ TWL XL LVL3 (GOWN DISPOSABLE) ×1 IMPLANT
GOWN STRL REUS W/TWL LRG LVL3 (GOWN DISPOSABLE) ×2
GOWN STRL REUS W/TWL XL LVL3 (GOWN DISPOSABLE) ×1
INSERT FOGARTY SM (MISCELLANEOUS) IMPLANT
KIT BASIN OR (CUSTOM PROCEDURE TRAY) ×1 IMPLANT
KIT TURNOVER KIT B (KITS) ×1 IMPLANT
NS IRRIG 1000ML POUR BTL (IV SOLUTION) ×1 IMPLANT
PACK CV ACCESS (CUSTOM PROCEDURE TRAY) ×1 IMPLANT
PAD ARMBOARD 7.5X6 YLW CONV (MISCELLANEOUS) ×2 IMPLANT
POWDER SURGICEL 3.0 GRAM (HEMOSTASIS) IMPLANT
SLING ARM FOAM STRAP LRG (SOFTGOODS) IMPLANT
SUT MNCRL AB 4-0 PS2 18 (SUTURE) ×1 IMPLANT
SUT PROLENE 6 0 BV (SUTURE) ×1 IMPLANT
SUT VIC AB 3-0 SH 27 (SUTURE) ×1
SUT VIC AB 3-0 SH 27X BRD (SUTURE) ×1 IMPLANT
TOWEL GREEN STERILE (TOWEL DISPOSABLE) ×1 IMPLANT
UNDERPAD 30X36 HEAVY ABSORB (UNDERPADS AND DIAPERS) ×1 IMPLANT
WATER STERILE IRR 1000ML POUR (IV SOLUTION) ×1 IMPLANT

## 2022-05-26 NOTE — Op Note (Signed)
    Patient name: Frank Coffey MRN: [id number] DOB: 1980/04/22 Sex: male  05/26/2022 Pre-operative Diagnosis: End-stage renal disease Post-operative diagnosis:  Same Surgeon:  Apolinar Junes C. Randie Heinz, MD Assistant: Aggie Moats, PA Procedure Performed:  Right brachial artery to cephalic vein AV fistula creation   Indications: 42 year old male with history of end-stage renal disease currently on dialysis via catheter.  He has a previous right radiocephalic fistula creation that has failed and is now indicated for upper arm access.  Findings: Brachial artery was healthy with 5 mm external diameter and no internal disease.  The cephalic vein measured approximately 4 mm with branching of the antecubitum and at completion there was a a strong thrill confirmed with Doppler and a radial signal at the wrist confirmed with Doppler that augmented with compression of the fistula.   Procedure:  The patient was identified in the holding area and taken to the operating was placed in bilateral table and MAC anesthesia was induced.  He was to the prepped draped in the right upper extremity usual fashion, antibiotics were ministered a timeout was called.  Preoperative block is in place this was tested to be intact.  Ultrasound used to identify a very large cephalic vein and a transverse incision was created just above this we dissected down to the vein and divided branches between ties and marked this for orientation.  We then dissected through the deep fascia the brachial artery and this was very large and soft and amenable for clamping was encircled with vessel loop.  The vein was then transected distally at a branch point and flushed heparinized saline and clamped.  The artery was then clamped distally and proximally opened longitudinally and flushed distally with heparinized saline only given the large size.  The vein was then sewn into side with 6-0 Prolene suture.  Prior completion without flushing all directions.  After  completion we did place a few repair stitches given the large size of the vein and then freed up some soft tissue around the vein to allow it to seat nicely.  There was a strong thrill in the fistula can be traced with Doppler and radial artery signal at the wrist that augmented with compression.  The wound was irrigated hemostasis was obtained we closed in layers with Vicryl and Monocryl.  Patient was awakened from anesthesia having tolerated procedure without any complication.  Counts were correct at completion.  EBL: 50 cc     Leianne Callins C. Randie Heinz, MD [facility] Office: [phone] Pager: [phone]

## 2022-05-26 NOTE — Transfer of Care (Signed)
Immediate Anesthesia Transfer of Care Note  Patient: Frank Coffey  Procedure(s) Performed: RIGHT ARM BRACHIOCEPHALIC ARTERIOVENOUS (AV) FISTULA CREATION (Right: Arm Upper)  Patient Location: PACU  Anesthesia Type:MAC combined with regional for post-op pain  Level of Consciousness: drowsy  Airway & Oxygen Therapy: Patient Spontanous Breathing  Post-op Assessment: Report given to RN and Post -op Vital signs reviewed and stable  Post vital signs: Reviewed and stable  Last Vitals:  Vitals Value Taken Time  BP 131/77 05/26/22 1048  Temp    Pulse 82 05/26/22 1051  Resp 27 05/26/22 1051  SpO2 84 % 05/26/22 1051  Vitals shown include unvalidated device data.  Last Pain:  Vitals:   05/26/22 0806  TempSrc:   PainSc: 0-No pain         Complications: No notable events documented.

## 2022-05-26 NOTE — Anesthesia Procedure Notes (Signed)
Procedure Name: General with mask airway Date/Time: 05/26/2022 9:56 AM  Performed by: Katina Degree, CRNAPre-anesthesia Checklist: Patient identified, Emergency Drugs available, Suction available and Patient being monitored Patient Re-evaluated:Patient Re-evaluated prior to induction Oxygen Delivery Method: Nasal cannula Preoxygenation: Pre-oxygenation with 100% oxygen Induction Type: IV induction Dental Injury: Teeth and Oropharynx as per pre-operative assessment

## 2022-05-26 NOTE — Anesthesia Postprocedure Evaluation (Signed)
Anesthesia Post Note  Patient: Frank Coffey  Procedure(s) Performed: RIGHT ARM BRACHIOCEPHALIC ARTERIOVENOUS (AV) FISTULA CREATION (Right: Arm Upper)     Patient location during evaluation: PACU Anesthesia Type: Regional and MAC Level of consciousness: awake and alert Pain management: pain level controlled Vital Signs Assessment: post-procedure vital signs reviewed and stable Respiratory status: spontaneous breathing, nonlabored ventilation and respiratory function stable Cardiovascular status: stable and blood pressure returned to baseline Postop Assessment: no apparent nausea or vomiting Anesthetic complications: no  No notable events documented.  Last Vitals:  Vitals:   05/26/22 1100 05/26/22 1115  BP: 129/82 110/85  Pulse: 71 78  Resp: 19 (!) 21  Temp:  36.7 C  SpO2: 94% 94%    Last Pain:  Vitals:   05/26/22 1115  TempSrc:   PainSc: 0-No pain                 Ilir Mahrt,W. EDMOND

## 2022-05-26 NOTE — Discharge Instructions (Signed)
° °  [facility] ° °Discharge Instructions ° °AV Fistula or Graft Surgery for Dialysis Access ° °Please refer to the following instructions for your post-procedure care. Your surgeon or physician assistant will discuss any changes with you. ° °Activity ° °You may drive the day following your surgery, if you are comfortable and no longer taking prescription pain medication. Resume full activity as the soreness in your incision resolves. ° °Bathing/Showering ° °You may shower after you go home. Keep your incision dry for 48 hours. Do not soak in a bathtub, hot tub, or swim until the incision heals completely. You may not shower if you have a hemodialysis catheter. ° °Incision Care ° °Clean your incision with mild soap and water after 48 hours. Pat the area dry with a clean towel. You do not need a bandage unless otherwise instructed. Do not apply any ointments or creams to your incision. You may have skin glue on your incision. Do not peel it off. It will come off on its own in about one week. Your arm may swell a bit after surgery. To reduce swelling use pillows to elevate your arm so it is above your heart. Your doctor will tell you if you need to lightly wrap your arm with an ACE bandage. ° °Diet ° °Resume your normal diet. There are not special food restrictions following this procedure. In order to heal from your surgery, it is CRITICAL to get adequate nutrition. Your body requires vitamins, minerals, and protein. Vegetables are the best source of vitamins and minerals. Vegetables also provide the perfect balance of protein. Processed food has little nutritional value, so try to avoid this. ° °Medications ° °Resume taking all of your medications. If your incision is causing pain, you may take over-the counter pain relievers such as acetaminophen (Tylenol). If you were prescribed a stronger pain medication, please be aware these medications can cause nausea and constipation. Prevent  nausea by taking the medication with a snack or meal. Avoid constipation by drinking plenty of fluids and eating foods with high amount of fiber, such as fruits, vegetables, and grains. Do not take Tylenol if you are taking prescription pain medications. ° ° ° ° °Follow up °Your surgeon may want to see you in the office following your access surgery. If so, this will be arranged at the time of your surgery. ° °Please call us immediately for any of the following conditions: ° °Increased pain, redness, drainage (pus) from your incision site °Fever of 101 degrees or higher °Severe or worsening pain at your incision site °Hand pain or numbness. ° °Reduce your risk of vascular disease: ° °Stop smoking. If you would like help, call [facility] at 1-800-QUIT-NOW (1-800-784-8669) or [place] at 336-586-4000 ° °Manage your cholesterol °Maintain a desired weight °Control your diabetes °Keep your blood pressure down ° °Dialysis ° °It will take several weeks to several months for your new dialysis access to be ready for use. Your surgeon will determine when it is OK to use it. Your nephrologist will continue to direct your dialysis. You can continue to use your Permcath until your new access is ready for use. ° °If you have any questions, please call the office at [phone]. ° °

## 2022-05-26 NOTE — Interval H&P Note (Signed)
History and Physical Interval Note:  05/26/2022 9:06 AM  Frank Coffey  has presented today for surgery, with the diagnosis of End Stage Renal Disease.  The various methods of treatment have been discussed with the patient and family. After consideration of risks, benefits and other options for treatment, the patient has consented to  Procedure(s): RIGHT ARM ARTERIOVENOUS (AV) FISTULA VERSUS ARTERIOVENOUS GRAFT CREATION (Right) as a surgical intervention.  The patient's history has been reviewed, patient examined, no change in status, stable for surgery.  I have reviewed the patient's chart and labs.  Questions were answered to the patient's satisfaction.     [name]

## 2022-05-26 NOTE — Anesthesia Procedure Notes (Signed)
Anesthesia Regional Block: Supraclavicular block   Pre-Anesthetic Checklist: , timeout performed,  Correct Patient, Correct Site, Correct Laterality,  Correct Procedure, Correct Position, site marked,  Risks and benefits discussed,  Pre-op evaluation,  At surgeon's request and post-op pain management  Laterality: Right  Prep: Maximum Sterile Barrier Precautions used, chloraprep       Needles:  Injection technique: Single-shot  Needle Type: Echogenic Stimulator Needle     Needle Length: 9cm  Needle Gauge: 21     Additional Needles:   Procedures:,,,, ultrasound used (permanent image in chart),,    Narrative:  Start time: 05/26/2022 9:08 AM End time: 05/26/2022 9:18 AM Injection made incrementally with aspirations every 5 mL.  Performed by: Personally  Anesthesiologist: Gaynelle Adu, MD  Additional Notes:

## 2022-05-27 ENCOUNTER — Telehealth: Payer: Managed Care, Other (non HMO) | Admitting: Nurse Practitioner

## 2022-05-27 ENCOUNTER — Encounter (HOSPITAL_COMMUNITY): Payer: Self-pay | Admitting: Vascular Surgery

## 2022-05-27 DIAGNOSIS — M1039 Gout due to renal impairment, multiple sites: Secondary | ICD-10-CM | POA: Diagnosis not present

## 2022-05-27 MED ORDER — ALLOPURINOL 300 MG PO TABS: 150.0000 mg | ORAL_TABLET | ORAL | 0 refills | Status: DC | PRN

## 2022-05-27 MED ORDER — PREDNISONE 20 MG PO TABS
40.0000 mg | ORAL_TABLET | Freq: Every day | ORAL | 0 refills | Status: AC
Start: 2022-05-27 — End: 2022-06-03

## 2022-05-27 NOTE — Progress Notes (Signed)
E-Visit for Gout Symptoms I have prescribed Prednisone 40 mg daily for 7 and refilled allopurinol for 30 days only. Please follow up with your PCP for refills as your last refill was from a hospital admission in November. Also with your kidney function you should only be taking a half a tablet of allopurinol and not a whole tablet. Please send a mychart message to your provider to discuss the dosing of your allopurinol and future refills.   We are sorry that you are not feeling well. We are here to help!  Based on what you shared with me it looks like you have a flare of your gout.  Gout is a form of arthritis. It can cause pain and swelling in the joints. At first, it tends to affect only 1 joint - most frequently the big toe. It happens in people who have too much uric acid in the blood. Uric acid is a chemical that is produced when the body breaks down certain foods. Uric acid can form sharp needle-like crystals that build up in the joints and cause pain. Uric acid crystals can also form inside the tubes that carry urine from the kidneys to the bladder. These crystals can turn into "kidney stones" that can cause pain and problems with the flow of urine. People with gout get sudden "flares" or attacks of severe pain, most often the big toe, ankle, or knee. Often the joint also turns red and swells. Usually, only 1 joint is affected, but some people have pain in more than 1 joint. Gout flares tend to happen more often during the night.  The pain from gout can be extreme. The pain and swelling are worst at the beginning of a gout flare. The symptoms then get better within a few days to weeks. It is not clear how the body "turns off" a gout flare.  Do not start any NEW preventative medicine until the gout has cleared completely. However, If you are already on Probenecid or Allopurinol for CHRONIC gout, you may continue taking this during an active flare up    HOME CARE Losing weight can help relieve  gout. It's not clear that following a specific diet plan will help with gout symptoms but eating a balanced diet can help improve your overall health. It can also help you lose weight, if you are overweight. In general, a healthy diet includes plenty of fruits, vegetables, whole grains, and low-fat dairy products (labelled "low fat", skim, 2%). Avoid sugar sweetened drinks (including sodas, tea, juice and juice blends, coffee drinks and sports drinks) Limit alcohol to 1-2 drinks of beer, spirits or wine daily these can make gout flares worse. Some people with gout also have other health problems, such as heart disease, high blood pressure, kidney disease, or obesity. If you have any of these issues, it's important to work with your doctor to manage them. This can help improve your overall health and might also help with your gout.  GET HELP RIGHT AWAY IF: Your symptoms persist after you have completed your treatment plan You develop severe diarrhea You develop abnormal sensations  You develop vomiting,   You develop weakness  You develop abdominal pain  FOLLOW UP WITH YOUR PRIMARY PROVIDER IF: If your symptoms do not improve within 10 days  MAKE SURE YOU  Understand these instructions. Will watch your condition. Will get help right away if you are not doing well or get worse.  Thank you for choosing an e-visit.  Your e-visit  answers were reviewed by a board certified advanced clinical practitioner to complete your personal care plan. Depending upon the condition, your plan could have included both over the counter or prescription medications.  Please review your pharmacy choice. Make sure the pharmacy is open so you can pick up prescription now. If there is a problem, you may contact your provider through [employer] and have the prescription routed to another pharmacy.  Your safety is important to [place]. If you have drug allergies check your prescription carefully.   For the next 24  hours you can use MyChart to ask questions about today's visit, request a non-urgent call back, or ask for a work or school excuse. You will get an email in the next two days asking about your experience. I hope that your e-visit has been valuable and will speed your recovery.

## 2022-05-27 NOTE — Progress Notes (Signed)
I have spent 5 minutes in review of e-visit questionnaire, review and updating patient chart, medical decision making and response to patient.  ° °Peggie Hornak W Eufemio Strahm, NP ° °  °

## 2022-05-30 ENCOUNTER — Other Ambulatory Visit: Payer: Self-pay

## 2022-05-30 ENCOUNTER — Telehealth: Payer: Self-pay

## 2022-05-30 DIAGNOSIS — T82590A Other mechanical complication of surgically created arteriovenous fistula, initial encounter: Secondary | ICD-10-CM

## 2022-05-30 NOTE — Telephone Encounter (Signed)
[place] with [employer], [place], called stating that pt has no bruit/thrill and is requesting an appt.  Reviewed pt's chart, returned call for clarification, two identifiers used. She stated that the thrill was diminished on Sat and not present today. Pt has TDC for treatment and is having no issues with that.  Spoke with PA's who recommended asking Dr. Randie Heinz about possible salvage of AVF. Sent msg, Dr. Randie Heinz replied with scheduling pt for thrombectomy for this Friday.  Called [place] to give update, she relayed to pt. Pt to expect call to schedule.

## 2022-05-31 ENCOUNTER — Other Ambulatory Visit: Payer: Self-pay

## 2022-05-31 ENCOUNTER — Encounter (HOSPITAL_COMMUNITY): Payer: Self-pay | Admitting: Vascular Surgery

## 2022-05-31 NOTE — Progress Notes (Signed)
Pt scheduled for surgery 06/02/22 with Dr. Randie Heinz for a thrombectomy of right upper arm arteriovenous fistula, called pt to provide pre-op instructions, but did not want instructions yet. Pt states he isn't sure whether surgery is needed as he "hasn't talked to a doctor yet." Pt wants to call surgeon's office to discuss before deciding to proceed.

## 2022-05-31 NOTE — Progress Notes (Signed)
SDW call   Patient returned phone call. He spoke with surgeon's office and decided to proceed with surgery.  Pre-op instructions provided over the phone. Patient verbalized understanding of instructions.    PCP - Alysia Penna, NP Cardiologist - Dr. Riley Lam Pulmonary: Denies Nephrology: Dr. Allena Katz, [facility]   PPM/ICD - Denies   Chest x-ray - 11/21/20223 EKG -  12/09/2021 Stress Test - 04/28/2022 ECHO - 11/09/2021 Cardiac Cath -    Sleep Study/sleep apnea/CPAP: Diagnosed with sleep apnea, does not wear his CPAP   Non-diabetic   Blood Thinner Instructions: Denies Aspirin Instructions: Denies   ERAS Protcol - No, NPO PRE-SURGERY Ensure or G2-    COVID TEST- n/a    Anesthesia review: Yes.     Patient denies shortness of breath, fever, cough and chest pain over the phone cal

## 2022-06-01 ENCOUNTER — Ambulatory Visit (INDEPENDENT_AMBULATORY_CARE_PROVIDER_SITE_OTHER): Payer: Managed Care, Other (non HMO) | Admitting: Vascular Surgery

## 2022-06-01 DIAGNOSIS — N186 End stage renal disease: Secondary | ICD-10-CM

## 2022-06-01 NOTE — Progress Notes (Signed)
Chief Complaint: Nonfunctioning right arm AV fistula  History of Present Illness: Frank Coffey is a 42 y.o. male with status post right brachial artery to cephalic vein AV fistula creation after a failed right radial artery to cephalic vein fistula.  Patient now states there is no thrill at dialysis and he is quite frustrated by this understandably.  He is not having pain in his right hand.  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 OR;  Service: Vascular;  Laterality: Right;   AV FISTULA PLACEMENT Right 05/26/2022   Procedure: RIGHT ARM BRACHIOCEPHALIC ARTERIOVENOUS (AV) FISTULA CREATION;  Surgeon: Maeola Harman, MD;  Location: [facility] OR;  Service: Vascular;  Laterality: Right;   BIOPSY  02/09/2020   Procedure: BIOPSY;  Surgeon: Lemar Lofty., MD;  Location: [facility] ENDOSCOPY;  Service: Gastroenterology;;   CHOLECYSTECTOMY     COLONOSCOPY WITH PROPOFOL N/A 02/09/2020   Procedure: COLONOSCOPY WITH PROPOFOL;  Surgeon: Lemar Lofty., MD;   Location: [facility] ENDOSCOPY;  Service: Gastroenterology;  Laterality: N/A;   ESOPHAGOGASTRODUODENOSCOPY (EGD) WITH PROPOFOL N/A 02/09/2020   Procedure: ESOPHAGOGASTRODUODENOSCOPY (EGD) WITH PROPOFOL;  Surgeon: Meridee Score Netty Starring., MD;  Location: [facility] ENDOSCOPY;  Service: Gastroenterology;  Laterality: N/A;   EYE SURGERY  01/18/2011   IR FLUORO GUIDE CV LINE RIGHT  12/07/2021   IR US GUIDE VASC ACCESS RIGHT  12/07/2021   POLYPECTOMY  02/09/2020   Procedure: POLYPECTOMY;  Surgeon: Mansouraty, Netty Starring., MD;  Location: [facility] ENDOSCOPY;  Service: Gastroenterology;;   RETINAL DETACHMENT REPAIR W/ SCLERAL BUCKLE LE     Left    No outpatient medications have been marked as taking for the 06/01/22 encounter (Office Visit) with Maeola Harman, MD.    12 system ROS was negative unless otherwise noted in HPI   Observations/Objective: He demonstrates good understanding of our conversation.  Assessment and Plan: 42 year old male with thrombosed right arm AV fistula status post conversion to upper arm fistula after thrombosis of the forearm fistula.  Some concern he may have hypercoagulable state given suitable vein and artery on recent evaluation.  I have offered him surgical thrombectomy possible conversion to graft versus further evaluation in our office which she would like to pursue we will get him set up in the next week to be evaluated in our office and possibly plan further access in the near future.   Signed, [name] [facility] [place] Office: [phone]  06/01/2022, 11:14 AM

## 2022-06-02 ENCOUNTER — Ambulatory Visit (HOSPITAL_COMMUNITY)
Admission: RE | Admit: 2022-06-02 | Payer: Managed Care, Other (non HMO) | Source: Ambulatory Visit | Admitting: Vascular Surgery

## 2022-06-02 SURGERY — THROMBECTOMY ARTERIOVENOUS FISTULA
Anesthesia: Choice | Laterality: Right

## 2022-06-06 ENCOUNTER — Other Ambulatory Visit (HOSPITAL_COMMUNITY): Payer: Self-pay | Admitting: Nephrology

## 2022-06-06 DIAGNOSIS — N186 End stage renal disease: Secondary | ICD-10-CM

## 2022-06-06 DIAGNOSIS — Z992 Dependence on renal dialysis: Secondary | ICD-10-CM

## 2022-06-07 ENCOUNTER — Ambulatory Visit (HOSPITAL_COMMUNITY)
Admission: RE | Admit: 2022-06-07 | Discharge: 2022-06-07 | Disposition: A | Discharge status: Home / Self Care | Payer: Managed Care, Other (non HMO) | Source: Ambulatory Visit | Attending: Nephrology | Admitting: Nephrology

## 2022-06-07 ENCOUNTER — Encounter: Payer: Self-pay | Admitting: Vascular Surgery

## 2022-06-07 ENCOUNTER — Other Ambulatory Visit (HOSPITAL_COMMUNITY): Payer: Self-pay | Admitting: Nephrology

## 2022-06-07 ENCOUNTER — Ambulatory Visit (INDEPENDENT_AMBULATORY_CARE_PROVIDER_SITE_OTHER): Payer: Managed Care, Other (non HMO) | Admitting: Vascular Surgery

## 2022-06-07 VITALS — BP 180/116 | HR 72 | Temp 98.2°F | Resp 20 | Ht 69.0 in | Wt 345.9 lb

## 2022-06-07 DIAGNOSIS — N186 End stage renal disease: Secondary | ICD-10-CM | POA: Diagnosis not present

## 2022-06-07 DIAGNOSIS — T8249XA Other complication of vascular dialysis catheter, initial encounter: Secondary | ICD-10-CM | POA: Diagnosis present

## 2022-06-07 DIAGNOSIS — Z992 Dependence on renal dialysis: Secondary | ICD-10-CM | POA: Insufficient documentation

## 2022-06-07 DIAGNOSIS — Y832 Surgical operation with anastomosis, bypass or graft as the cause of abnormal reaction of the patient, or of later complication, without mention of misadventure at the time of the procedure: Secondary | ICD-10-CM | POA: Diagnosis not present

## 2022-06-07 HISTORY — PX: IR FLUORO GUIDE CV LINE RIGHT: IMG2283

## 2022-06-07 HISTORY — PX: IR PTA VENOUS EXCEPT DIALYSIS CIRCUIT: IMG6126

## 2022-06-07 HISTORY — PX: IR PTA ADDL CENTRAL DIALYSIS SEG THRU DIALY CIRCUIT RIGHT: IMG6121

## 2022-06-07 MED ORDER — IOHEXOL 300 MG/ML  SOLN
50.0000 mL | Freq: Once | INTRAMUSCULAR | Status: AC | PRN
  Administered 2022-06-07: 15 mL via INTRAVENOUS

## 2022-06-07 MED ORDER — LIDOCAINE HCL 1 % IJ SOLN
INTRAMUSCULAR | Status: AC
  Filled 2022-06-07: qty 20

## 2022-06-07 MED ORDER — CHLORHEXIDINE GLUCONATE 4 % EX SOLN
CUTANEOUS | Status: AC
  Filled 2022-06-07: qty 15

## 2022-06-07 MED ORDER — HEPARIN SODIUM (PORCINE) 1000 UNIT/ML IJ SOLN
INTRAMUSCULAR | Status: AC
  Filled 2022-06-07: qty 10

## 2022-06-07 MED ORDER — CEFAZOLIN SODIUM-DEXTROSE 2-4 GM/100ML-% IV SOLN
INTRAVENOUS | Status: AC
  Filled 2022-06-07: qty 100

## 2022-06-07 MED ORDER — HEPARIN SODIUM (PORCINE) 1000 UNIT/ML IJ SOLN
4000.0000 [IU] | Freq: Once | INTRAMUSCULAR | Status: AC
  Administered 2022-06-07: 3.2 mL via INTRAVENOUS

## 2022-06-07 MED ORDER — CEFAZOLIN IN SODIUM CHLORIDE 3-0.9 GM/100ML-% IV SOLN
3.0000 g | Freq: Once | INTRAVENOUS | Status: DC
  Filled 2022-06-07: qty 100

## 2022-06-07 MED ORDER — CEFAZOLIN IN SODIUM CHLORIDE 3-0.9 GM/100ML-% IV SOLN
3.0000 g | Freq: Once | INTRAVENOUS | Status: AC
  Administered 2022-06-07: 3 g via INTRAVENOUS
  Filled 2022-06-07: qty 100

## 2022-06-07 MED ORDER — LIDOCAINE HCL 1 % IJ SOLN
20.0000 mL | Freq: Once | INTRAMUSCULAR | Status: AC
  Administered 2022-06-07: 10 mL via INTRADERMAL

## 2022-06-07 NOTE — Progress Notes (Signed)
Patient ID: Frank Coffey, male   DOB: [birth year], 42 y.o.   MRN: [id number]  Reason for Consult: Follow-up   Referred by Frank Ng, NP  Subjective:     HPI:  Frank Coffey is a 42 y.o. male history of end-stage renal disease currently on dialysis Tuesdays, Thursdays and Saturdays via tunneled dialysis catheter.  Unfortunately catheter is not working in the recent days he is scheduled for replacement today with interventional radiology.  He states that soon after his last access procedure he did not feel a thrill.  He is frustrated that nothing is working for him.  Patient is left-hand dominant  Primary nephrologist is Dr. Allena Coffey.  He continues to work 2 jobs 1 at the [employer] center and another with [employer].  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)    IBS (irritable bowel syndrome)    Obesity    OSA (obstructive sleep apnea)    Pneumonia 04/2019   with Covid   Sleep apnea    Family History  Problem Relation Age of Onset   Hypertension Mother    Hyperlipidemia Mother    Heart disease Mother    Kidney disease Mother    Depression Mother    Stomach cancer Father    Healthy Sister        x2   Healthy Brother        x2   Hypertension Maternal Grandmother    Stroke Maternal Grandmother    Heart disease Maternal Grandmother    Hyperlipidemia Maternal  Grandmother    Stroke Maternal Grandfather    Hypertension Maternal Grandfather    Heart disease Maternal Grandfather    Hyperlipidemia Maternal Grandfather    Alzheimer's disease Maternal Grandfather    Cancer - Other Paternal Grandmother    Healthy Daughter        x1   Healthy Son        x2   Diabetes Neg Hx    Heart attack Neg Hx    Sudden death Neg Hx    Colon cancer Neg Hx    Esophageal cancer Neg Hx    Pancreatic cancer Neg Hx    Inflammatory bowel disease Neg Hx    Liver disease Neg Hx    Rectal cancer Neg Hx    Past Surgical History:  Procedure Laterality Date   AV FISTULA PLACEMENT Right 02/17/2022   Procedure: RIGHT ARTERIOVENOUS (AV) FISTULA CREATION;  Surgeon: Maeola Harman, MD;  Location: [facility] OR;  Service: Vascular;  Laterality: Right;   AV FISTULA PLACEMENT Right 05/26/2022   Procedure: RIGHT ARM BRACHIOCEPHALIC ARTERIOVENOUS (AV) FISTULA CREATION;  Surgeon: Maeola Harman, MD;  Location: [facility] OR;  Service: Vascular;  Laterality: Right;   BIOPSY  02/09/2020   Procedure: BIOPSY;  Surgeon:  Mansouraty, Netty Starring., MD;  Location: [facility] ENDOSCOPY;  Service: Gastroenterology;;   CHOLECYSTECTOMY     COLONOSCOPY WITH PROPOFOL N/A 02/09/2020   Procedure: COLONOSCOPY WITH PROPOFOL;  Surgeon: Meridee Score Netty Starring., MD;  Location: [facility] ENDOSCOPY;  Service: Gastroenterology;  Laterality: N/A;   ESOPHAGOGASTRODUODENOSCOPY (EGD) WITH PROPOFOL N/A 02/09/2020   Procedure: ESOPHAGOGASTRODUODENOSCOPY (EGD) WITH PROPOFOL;  Surgeon: Meridee Score Netty Starring., MD;  Location: [facility] ENDOSCOPY;  Service: Gastroenterology;  Laterality: N/A;   EYE SURGERY  01/18/2011   IR FLUORO GUIDE CV LINE RIGHT  12/07/2021   IR US GUIDE VASC ACCESS RIGHT  12/07/2021   POLYPECTOMY  02/09/2020   Procedure: POLYPECTOMY;  Surgeon: Mansouraty, Netty Starring., MD;  Location: [facility] ENDOSCOPY;  Service: Gastroenterology;;   RETINAL DETACHMENT REPAIR W/ SCLERAL BUCKLE LE     Left    Short Social History:   Social History   Tobacco Use   Smoking status: Never   Smokeless tobacco: Never  Substance Use Topics   Alcohol use: Not Currently    Comment: occ    No Known Allergies  Current Outpatient Medications  Medication Sig Dispense Refill   albuterol (VENTOLIN HFA) 108 (90 Base) MCG/ACT inhaler Inhale 1-2 puffs into the lungs every 6 (six) hours as needed for wheezing (cough). 8 g 2   allopurinol (ZYLOPRIM) 300 MG tablet Take 0.5 tablets (150 mg total) by mouth every dialysis (after dialysis). 15 tablet 0   carvedilol (COREG) 25 MG tablet TAKE 1 TABLET(25 MG) BY MOUTH TWICE DAILY WITH A MEAL 60 tablet 3   dorzolamide-timolol (COSOPT) 22.3-6.8 MG/ML ophthalmic solution Place 1 drop into the left eye 2 (two) times daily.     Doxercalciferol (HECTOROL IV) as directed. On dialysis days     HYDROcodone-acetaminophen (NORCO) 5-325 MG tablet Take 1 tablet by mouth every 6 (six) hours as needed for moderate pain. 10 tablet 0   ipratropium (ATROVENT) 0.03 % nasal spray Place 2 sprays into both nostrils every 12 (twelve) hours. 30 mL 12   isosorbide mononitrate (IMDUR) 60 MG 24 hr tablet Take 60 mg by mouth daily.     latanoprost (XALATAN) 0.005 % ophthalmic solution Place 1 drop into the left eye at bedtime.     latanoprost (XALATAN) 0.005 % ophthalmic solution Place 1 drop into the left eye at bedtime.     lidocaine-prilocaine (EMLA) cream Apply 1 Application topically once. Dialysis days     LINZESS 145 MCG CAPS capsule TAKE 1 CAPSULE(145 MCG) BY MOUTH DAILY BEFORE BREAKFAST. GIVEN** (Patient taking differently: Take 145 mcg by mouth daily as needed (Constipation).) 90 capsule 3   sevelamer carbonate (RENVELA) 800 MG tablet Take 800 mg by mouth 3 (three) times daily with meals.     No current facility-administered medications for this visit.    Review of Systems  Constitutional: Positive for unexpected weight change.  HENT: HENT negative.  Eyes: Eyes negative.  Respiratory: Respiratory  negative.  Cardiovascular: Cardiovascular negative.  GI: Gastrointestinal negative.  Musculoskeletal: Musculoskeletal negative.  Skin: Skin negative.  Neurological: Neurological negative. Hematologic: Hematologic/lymphatic negative.  Psychiatric: Psychiatric negative.        Objective:  Objective   Vitals:   06/07/22 0907  BP: (!) 180/116  Pulse: 72  Resp: 20  Temp: 98.2 F (36.8 C)  SpO2: 93%  Weight: (!) 345 lb 14.4 oz (156.9 kg)  Height: 5\' 9"  (1.753 m)   Body mass index is 51.08 kg/m.  Physical Exam Awake alert oriented Right upper extremity incisions well-healed no thrills appreciable  Assessment/Plan:     42 year old male status post 2 upper extremity fistulas on his nondominant right arm 1 at the wrist and 1 at the antecubital both of which failed shortly after creation.  I discussed with the patient that I am concerned for hypercoagulable state or central venous stenosis versus occlusion.  Plan will be for bilateral upper extremity venography to evaluate the central venous system and then can consider either repeat attempted graft versus fistula and I will need to discuss with his primary nephrologist Dr. Allena Coffey whether or not we put him on low-dose anticoagulation after the next procedure given his early failures.  Plans were discussed with the patient he is to get [facility] with interventional radiology today.  He is attempting to lose weight for kidney transplant in the future.     Maeola Harman MD [facility] [place] [facility]

## 2022-06-07 NOTE — Procedures (Signed)
Interventional Radiology Procedure Note  Procedure:  Image guided exchange of right IJ tunneled HD cath.  Balloon/PTA of fibrin sheath.  New 19cm tip to cuff, operation with excellent aspiration  Complications: None  Recommendations:  - Ok to use - Do not submerge - Routine line care  - dc now  Signed,  Yvone Neu. Loreta Ave, DO

## 2022-06-08 ENCOUNTER — Encounter (HOSPITAL_COMMUNITY): Payer: Self-pay

## 2022-06-08 ENCOUNTER — Other Ambulatory Visit: Payer: Self-pay

## 2022-06-08 ENCOUNTER — Other Ambulatory Visit (HOSPITAL_COMMUNITY): Payer: Self-pay | Admitting: Nephrology

## 2022-06-08 DIAGNOSIS — N186 End stage renal disease: Secondary | ICD-10-CM

## 2022-06-14 ENCOUNTER — Other Ambulatory Visit: Payer: Self-pay

## 2022-06-14 DIAGNOSIS — N186 End stage renal disease: Secondary | ICD-10-CM

## 2022-06-14 MED ORDER — SODIUM CHLORIDE 0.9% FLUSH
3.0000 mL | Freq: Two times a day (BID) | INTRAVENOUS | Status: AC
Start: 2022-06-14 — End: ?

## 2022-06-14 MED ORDER — SODIUM CHLORIDE 0.9 % IV SOLN
250.0000 mL | INTRAVENOUS | Status: AC | PRN
Start: 2022-06-14 — End: ?

## 2022-06-23 ENCOUNTER — Telehealth: Payer: Self-pay

## 2022-06-23 NOTE — Telephone Encounter (Signed)
Received a call from patient requesting to move procedure to a new date because of death in his family and unable to make appt on 6/10. Venogram rescheduled for 7/1. Instructions reviewed and he voiced understanding.

## 2022-06-28 ENCOUNTER — Encounter (HOSPITAL_COMMUNITY): Payer: Managed Care, Other (non HMO)

## 2022-07-03 ENCOUNTER — Telehealth: Payer: Managed Care, Other (non HMO) | Admitting: Physician Assistant

## 2022-07-03 DIAGNOSIS — K921 Melena: Secondary | ICD-10-CM

## 2022-07-03 DIAGNOSIS — R197 Diarrhea, unspecified: Secondary | ICD-10-CM

## 2022-07-03 NOTE — Progress Notes (Signed)
Because of black stool (melena) this raises concern for blood in the stool and warrants further evaluation and I recommend that you be seen in a face to face visit.   NOTE: There will be NO CHARGE for this eVisit   If you are having a true medical emergency please call 911.      For an urgent face to face visit, [place] has eight urgent care centers for your convenience:   NEW!! [facility] Health Urgent Care Center at [facility] Get Driving Directions [phone] [address] [place], [address]    [facility] Health Urgent Care Center at [facility] Get Driving Directions [phone] [address] [place], [place] [address]   [facility] Health Urgent Care Center [facility]) Get Driving Directions [phone] [address] [place], [place] [address]  [facility] Health Urgent Care Center [facility]) Get Driving Directions [phone] [address] [place],  [place]  [address]  [facility] Health Urgent Care Center [facility] - at [employer]  [phone] [address] [place],  [place] [address]   [facility] Health Urgent Care at [facility] Get Driving Directions [phone] [address] [place], [place] [address]   [facility] Health Urgent Care at [facility] Get Driving Directions  [phone] [address] [place], [place] [address]   [facility] Health Urgent Care at [facility] Directions [phone] [address] [place], [place] [address]  Your MyChart E-visit questionnaire answers were reviewed by a board certified advanced clinical practitioner to complete your personal care plan based on your specific symptoms.  Thank you for using e-Visits.

## 2022-07-06 ENCOUNTER — Telehealth: Payer: Managed Care, Other (non HMO) | Admitting: Family Medicine

## 2022-07-06 ENCOUNTER — Other Ambulatory Visit: Payer: Self-pay | Admitting: Nurse Practitioner

## 2022-07-06 DIAGNOSIS — M1039 Gout due to renal impairment, multiple sites: Secondary | ICD-10-CM

## 2022-07-06 NOTE — Telephone Encounter (Signed)
Requested Prescriptions  Pending Prescriptions Disp Refills   allopurinol (ZYLOPRIM) 300 MG tablet [Pharmacy Med Name: ALLOPURINOL 300MG  TABLETS] 15 tablet 0    Sig: TAKE 1/2 TABLETS BY MOUTH AFTER EVERY DIALYSIS     Endocrinology:  Gout Agents - allopurinol Failed - 07/06/2022  2:58 AM      Failed - Uric Acid in normal range and within 360 days    Uric Acid, Serum  Date Value Ref Range Status  10/13/2019 5.9 4.0 - 7.8 mg/dL Final         Failed - Cr in normal range and within 360 days    Creat  Date Value Ref Range Status  12/01/2014 3.00 (H) 0.60 - 1.35 mg/dL Final   Creatinine, Ser  Date Value Ref Range Status  05/26/2022 11.40 (H) 0.61 - 1.24 mg/dL Final         Failed - Valid encounter within last 12 months    Recent Outpatient Visits   None     Future Appointments             In 1 month Chandrasekhar, Rondel Jumbo, MD [facility]            Passed - CBC within normal limits and completed in the last 12 months    WBC  Date Value Ref Range Status  12/10/2021 7.9 4.0 - 10.5 K/uL Final   RBC  Date Value Ref Range Status  12/10/2021 4.55 4.22 - 5.81 MIL/uL Final   Hemoglobin  Date Value Ref Range Status  05/26/2022 13.6 13.0 - 17.0 g/dL Final  16/10/9602 54.0 (L) 13.0 - 17.7 g/dL Final   HCT  Date Value Ref Range Status  05/26/2022 40.0 39.0 - 52.0 % Final   Hematocrit  Date Value Ref Range Status  07/12/2021 41.6 37.5 - 51.0 % Final   MCHC  Date Value Ref Range Status  12/10/2021 30.0 30.0 - 36.0 g/dL Final   [facility]  Date Value Ref Range Status  12/10/2021 24.0 (L) 26.0 - 34.0 pg Final   MCV  Date Value Ref Range Status  12/10/2021 79.8 (L) 80.0 - 100.0 fL Final  07/12/2021 76 (L) 79 - 97 fL Final   No results found for: "PLTCOUNTKUC", "LABPLAT", "POCPLA" RDW  Date Value Ref Range Status  12/10/2021 17.8 (H) 11.5 - 15.5 % Final  07/12/2021 20.7 (H) 11.6 - 15.4 % Final

## 2022-07-06 NOTE — Progress Notes (Signed)
Because recent treatment for gout- 05/27/2022, your condition warrants further evaluation and I recommend that you be seen in a face to face visit.   NOTE: There will be NO CHARGE for this eVisit

## 2022-07-11 NOTE — Telephone Encounter (Signed)
Contact [facility] for refill

## 2022-07-17 ENCOUNTER — Ambulatory Visit (HOSPITAL_COMMUNITY)
Admission: RE | Admit: 2022-07-17 | Discharge: 2022-07-17 | Disposition: A | Discharge status: Home / Self Care | Payer: Managed Care, Other (non HMO) | Source: Ambulatory Visit | Attending: Vascular Surgery | Admitting: Vascular Surgery

## 2022-07-17 ENCOUNTER — Other Ambulatory Visit: Payer: Self-pay

## 2022-07-17 ENCOUNTER — Ambulatory Visit (HOSPITAL_COMMUNITY)
Admission: RE | Disposition: A | Discharge status: Home / Self Care | Payer: Self-pay | Source: Ambulatory Visit | Attending: Vascular Surgery

## 2022-07-17 DIAGNOSIS — T82898A Other specified complication of vascular prosthetic devices, implants and grafts, initial encounter: Secondary | ICD-10-CM

## 2022-07-17 DIAGNOSIS — I509 Heart failure, unspecified: Secondary | ICD-10-CM | POA: Insufficient documentation

## 2022-07-17 DIAGNOSIS — Z992 Dependence on renal dialysis: Secondary | ICD-10-CM

## 2022-07-17 DIAGNOSIS — I132 Hypertensive heart and chronic kidney disease with heart failure and with stage 5 chronic kidney disease, or end stage renal disease: Secondary | ICD-10-CM | POA: Diagnosis present

## 2022-07-17 DIAGNOSIS — N186 End stage renal disease: Secondary | ICD-10-CM | POA: Diagnosis not present

## 2022-07-17 HISTORY — PX: UPPER EXTREMITY VENOGRAPHY: CATH118272

## 2022-07-17 LAB — POCT I-STAT, CHEM 8
BUN: 59 mg/dL — ABNORMAL HIGH (ref 6–20)
Calcium, Ion: 1.17 mmol/L (ref 1.15–1.40)
Chloride: 102 mmol/L (ref 98–111)
Creatinine, Ser: 12.3 mg/dL — ABNORMAL HIGH (ref 0.61–1.24)
Glucose, Bld: 91 mg/dL (ref 70–99)
HCT: 37 % — ABNORMAL LOW (ref 39.0–52.0)
Hemoglobin: 12.6 g/dL — ABNORMAL LOW (ref 13.0–17.0)
Potassium: 4.3 mmol/L (ref 3.5–5.1)
Sodium: 137 mmol/L (ref 135–145)
TCO2: 24 mmol/L (ref 22–32)

## 2022-07-17 SURGERY — UPPER EXTREMITY VENOGRAPHY
Anesthesia: LOCAL | Laterality: Left

## 2022-07-17 MED ORDER — IODIXANOL 320 MG/ML IV SOLN
INTRAVENOUS | Status: DC | PRN
  Administered 2022-07-17: 30 mL

## 2022-07-17 MED ORDER — SODIUM CHLORIDE 0.9% FLUSH: 3.0000 mL | INTRAVENOUS | Status: DC | PRN

## 2022-07-17 MED ORDER — LIDOCAINE HCL (PF) 1 % IJ SOLN
INTRAMUSCULAR | Status: AC
  Filled 2022-07-17: qty 30

## 2022-07-17 SURGICAL SUPPLY — 2 items
STOPCOCK MORSE 400PSI 3WAY (MISCELLANEOUS) IMPLANT
TRAY PV CATH (CUSTOM PROCEDURE TRAY) ×1 IMPLANT

## 2022-07-17 NOTE — Op Note (Signed)
    Patient name: Frank Coffey MRN: [id number] DOB: [birth year] Sex: male  07/17/2022 Pre-operative Diagnosis: End-stage renal disease Post-operative diagnosis:  Same Surgeon:  Apolinar Junes C. Randie Heinz, MD Procedure Performed:   Left upper extremity venography  Indications: 42 year old male left-hand-dominant currently on dialysis via tunneled dialysis catheter.  He has 2 failed right upper extremity fistulas that failed very quickly after placement.  He is now indicated for central venography for further evaluation and plan future access.  Findings: Patient's left subclavian axillary veins and superior vena cava are all patent as is the cephalic arch.  The cephalic vein distally in the arm appears quite small although by previous vein mapping was rather robust.    Plan will be for left arm cephalic vein or basilic vein AV fistula creation versus graft in the near future and will need to consider anticoagulation to maintain primary patency.  I discussed this with the patient and his spouse and they demonstrate good understanding.   Procedure:  The patient was identified in the holding area and taken to room 8.  The patient was then placed supine on the table and timeout was called.  He had an existing IV in the left upper arm that was accessed and left upper extremity venography was performed with the above findings.  He tolerated the procedure without any complication.  Plan will be for left arm fistula versus graft on a nondialysis day in the near future.  Contrast: 30 cc.   Satvik Parco C. Randie Heinz, MD [facility] [place] Office: [phone] Pager: [phone]

## 2022-07-17 NOTE — H&P (Addendum)
HPI:   Frank Coffey is a 42 y.o. male history of end-stage renal disease currently on dialysis Tuesdays, Thursdays and Saturdays via tunneled dialysis catheter.  Unfortunately catheter is not working in the recent days he is scheduled for replacement today with interventional radiology.  He states that soon after his last access procedure he did not feel a thrill.  He is frustrated that nothing is working for him.   Patient is left-hand dominant   Primary nephrologist is Dr. Allena Katz.   He continues to work 2 jobs 1 at the [employer] center and another with [employer].       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)     IBS (irritable bowel syndrome)     Obesity     OSA (obstructive sleep apnea)     Pneumonia 04/2019    with Covid   Sleep apnea           Family History  Problem Relation Age of Onset   Hypertension Mother     Hyperlipidemia Mother     Heart disease Mother     Kidney disease Mother     Depression Mother     Stomach cancer Father     Healthy Sister          x2   Healthy Brother          x2   Hypertension Maternal Grandmother     Stroke Maternal Grandmother     Heart disease Maternal Grandmother     Hyperlipidemia Maternal Grandmother     Stroke Maternal Grandfather     Hypertension Maternal Grandfather     Heart disease Maternal Grandfather      Hyperlipidemia Maternal Grandfather     Alzheimer's disease Maternal Grandfather     Cancer - Other Paternal Grandmother     Healthy Daughter          x1   Healthy Son          x2   Diabetes Neg Hx     Heart attack Neg Hx     Sudden death Neg Hx     Colon cancer Neg Hx     Esophageal cancer Neg Hx     Pancreatic cancer Neg Hx     Inflammatory bowel disease Neg Hx     Liver disease Neg Hx     Rectal cancer Neg Hx           Past Surgical History:  Procedure Laterality Date   AV FISTULA PLACEMENT Right 02/17/2022    Procedure: RIGHT ARTERIOVENOUS (AV) FISTULA CREATION;  Surgeon: Maeola Harman, MD;  Location[facility] OR;  Service: Vascular;  Laterality: Right;   AV  FISTULA PLACEMENT Right 05/26/2022    Procedure: RIGHT ARM BRACHIOCEPHALIC ARTERIOVENOUS (AV) FISTULA CREATION;  Surgeon: Maeola Harman, MD;  Location: [facility] OR;  Service: Vascular;  Laterality: Right;   BIOPSY   02/09/2020    Procedure: BIOPSY;  Surgeon: Lemar Lofty., MD;  Location: [facility] ENDOSCOPY;  Service: Gastroenterology;;   CHOLECYSTECTOMY       COLONOSCOPY WITH PROPOFOL N/A 02/09/2020    Procedure: COLONOSCOPY WITH PROPOFOL;  Surgeon: Lemar Lofty., MD;  Location: [facility] ENDOSCOPY;  Service: Gastroenterology;  Laterality: N/A;   ESOPHAGOGASTRODUODENOSCOPY (EGD) WITH PROPOFOL N/A 02/09/2020    Procedure: ESOPHAGOGASTRODUODENOSCOPY (EGD) WITH PROPOFOL;  Surgeon: Meridee Score Netty Starring., MD;  Location: [facility] ENDOSCOPY;  Service: Gastroenterology;  Laterality: N/A;   EYE SURGERY   01/18/2011   IR FLUORO GUIDE CV LINE RIGHT   12/07/2021   IR US GUIDE VASC ACCESS RIGHT   12/07/2021   POLYPECTOMY   02/09/2020    Procedure: POLYPECTOMY;  Surgeon: Mansouraty, Netty Starring., MD;  Location: [facility] ENDOSCOPY;  Service: Gastroenterology;;   RETINAL DETACHMENT REPAIR W/ SCLERAL BUCKLE LE        Left      Short Social History:  Social History         Tobacco Use   Smoking status: Never   Smokeless  tobacco: Never  Substance Use Topics   Alcohol use: Not Currently      Comment: occ      No Known Allergies         Current Outpatient Medications  Medication Sig Dispense Refill   albuterol (VENTOLIN HFA) 108 (90 Base) MCG/ACT inhaler Inhale 1-2 puffs into the lungs every 6 (six) hours as needed for wheezing (cough). 8 g 2   allopurinol (ZYLOPRIM) 300 MG tablet Take 0.5 tablets (150 mg total) by mouth every dialysis (after dialysis). 15 tablet 0   carvedilol (COREG) 25 MG tablet TAKE 1 TABLET(25 MG) BY MOUTH TWICE DAILY WITH A MEAL 60 tablet 3   dorzolamide-timolol (COSOPT) 22.3-6.8 MG/ML ophthalmic solution Place 1 drop into the left eye 2 (two) times daily.       Doxercalciferol (HECTOROL IV) as directed. On dialysis days       HYDROcodone-acetaminophen (NORCO) 5-325 MG tablet Take 1 tablet by mouth every 6 (six) hours as needed for moderate pain. 10 tablet 0   ipratropium (ATROVENT) 0.03 % nasal spray Place 2 sprays into both nostrils every 12 (twelve) hours. 30 mL 12   isosorbide mononitrate (IMDUR) 60 MG 24 hr tablet Take 60 mg by mouth daily.       latanoprost (XALATAN) 0.005 % ophthalmic solution Place 1 drop into the left eye at bedtime.       latanoprost (XALATAN) 0.005 % ophthalmic solution Place 1 drop into the left eye at bedtime.       lidocaine-prilocaine (EMLA) cream Apply 1 Application topically once. Dialysis days       LINZESS 145 MCG CAPS capsule TAKE 1 CAPSULE(145 MCG) BY MOUTH DAILY BEFORE BREAKFAST. GIVEN** (Patient taking differently: Take 145 mcg by mouth daily as needed (Constipation).) 90 capsule 3   sevelamer carbonate (RENVELA) 800 MG tablet Take 800 mg by mouth 3 (three) times daily with meals.        No current facility-administered medications for this visit.      Review of Systems  Constitutional: Positive for unexpected weight change.  HENT: HENT negative.  Eyes: Eyes negative.  Respiratory: Respiratory negative.  Cardiovascular: Cardiovascular  negative.  GI: Gastrointestinal negative.  Musculoskeletal: Musculoskeletal negative.  Skin: Skin negative.  Neurological: Neurological negative. Hematologic: Hematologic/lymphatic negative.  Psychiatric: Psychiatric negative.          Objective:   Vitals:   07/17/22 0745 07/17/22 0746  BP: 122/86 122/86  Pulse: 69 71  Resp:  16  Temp:  97.7 F (36.5 C)  SpO2: 97% 99%       Physical Exam Awake alert oriented Right upper extremity incisions well-healed no thrills appreciable        Assessment/Plan:   42 year old male status post 2 upper extremity fistulas on his nondominant right arm 1 at the wrist and 1 at the antecubital both of which failed shortly after creation.  I discussed with the patient that I am concerned for hypercoagulable state or central venous stenosis versus occlusion.  Plan will be for bilateral upper extremity venography to evaluate the central venous system and then can consider either repeat attempted graft versus fistula and I will need to discuss with his primary nephrologist Dr. Allena Katz whether or not we put him on low-dose anticoagulation after the next procedure given his early failures.  Currently he has a functional CPAP.         Maeola Harman MD [facility]

## 2022-07-18 ENCOUNTER — Telehealth: Payer: Managed Care, Other (non HMO)

## 2022-07-18 ENCOUNTER — Encounter (HOSPITAL_COMMUNITY): Payer: Self-pay | Admitting: Vascular Surgery

## 2022-07-18 DIAGNOSIS — R369 Urethral discharge, unspecified: Secondary | ICD-10-CM

## 2022-07-19 ENCOUNTER — Encounter: Payer: Self-pay | Admitting: Family Medicine

## 2022-07-19 ENCOUNTER — Ambulatory Visit: Payer: Managed Care, Other (non HMO) | Admitting: Family Medicine

## 2022-07-19 ENCOUNTER — Encounter (HOSPITAL_COMMUNITY): Payer: Managed Care, Other (non HMO)

## 2022-07-19 VITALS — BP 124/78 | HR 102 | Temp 97.6°F | Ht 68.5 in | Wt 339.8 lb

## 2022-07-19 DIAGNOSIS — R369 Urethral discharge, unspecified: Secondary | ICD-10-CM

## 2022-07-19 HISTORY — DX: Urethral discharge, unspecified: R36.9

## 2022-07-19 MED ORDER — CEFTRIAXONE SODIUM 1 G IJ SOLR
1.0000 g | Freq: Once | INTRAMUSCULAR | Status: AC
Start: 2022-07-19 — End: 2022-07-19
  Administered 2022-07-19: 1 g via INTRAMUSCULAR

## 2022-07-19 MED ORDER — DOXYCYCLINE HYCLATE 100 MG PO TABS
100.0000 mg | ORAL_TABLET | Freq: Two times a day (BID) | ORAL | 0 refills | Status: DC
Start: 2022-07-19 — End: 2022-11-08

## 2022-07-19 NOTE — Progress Notes (Signed)
[facility] [address] [place] [address] Dept: [phone] Dept Fax: [phone]  Office Visit  Subjective:    Patient ID: Frank Coffey, male    DOB: May 18, 1980, 42 y.o..   MRN: [id number]  Chief Complaint  Patient presents with   Penile Discharge    C/o having discharge from penis x 1-2 days.     History of Present Illness:  Patient is in today with a 2-day history of a whitish penile discharge. He denies any dysuria or fever. He last had intercourse 2 weeks ago. He notes condom use.   Past Medical History: Patient Active Problem List   Diagnosis Date Noted   Secondary hyperparathyroidism of renal origin (HCC) 12/30/2021   Hypertensive heart and chronic kidney disease with heart failure and with stage 5 chronic kidney disease, or end stage renal disease (HCC) 12/12/2021   Anemia due to chronic kidney disease 12/07/2021   Combined systolic and diastolic cardiac dysfunction 12/07/2021   Secondary open-angle glaucoma, left, severe stage 11/10/2020   Morbid obesity with BMI of 50.0-59.9, adult (HCC) 02/12/2020   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   Gastro-esophageal reflux disease without esophagitis 12/09/2019   Family history of gastric cancer 12/09/2019   Hyperlipidemia, unspecified 04/30/2019   Atrioventricular block, second degree 11/18/2017   Hypertensive urgency 11/14/2017   Personal history of transient ischemic attack (TIA), and cerebral infarction without residual deficits 11/14/2017   Migraine 10/02/2016   Male erectile dysfunction, unspecified 10/02/2016   Onychomycosis 02/22/2016   NICM (nonischemic cardiomyopathy) (HCC) 07/19/2015   Chronic idiopathic constipation 04/30/2013   History of retinal detachment 02/07/2013   Chronic systolic CHF (congestive heart failure) (HCC) 12/19/2012   Benign hypertension with chronic kidney disease, stage IV (HCC) 12/19/2012   Gout 12/19/2012    ESRD (end stage renal disease) on dialysis (HCC) 12/19/2012   OSA (obstructive sleep apnea) 12/19/2012   Posterior subcapsular age-related cataract of left eye 02/23/2012   Branch retinal vein occlusion of left eye 02/14/2011   Past Surgical History:  Procedure Laterality Date   AV FISTULA PLACEMENT Right 02/17/2022   Procedure: RIGHT ARTERIOVENOUS (AV) FISTULA CREATION;  Surgeon: Maeola Harman, MD;  Location: [facility] OR;  Service: Vascular;  Laterality: Right;   AV FISTULA PLACEMENT Right 05/26/2022   Procedure: RIGHT ARM BRACHIOCEPHALIC ARTERIOVENOUS (AV) FISTULA CREATION;  Surgeon: Maeola Harman, MD;  Location: [facility] OR;  Service: Vascular;  Laterality: Right;   BIOPSY  02/09/2020   Procedure: BIOPSY;  Surgeon: Lemar Lofty., MD;  Location: [facility] ENDOSCOPY;  Service: Gastroenterology;;   CHOLECYSTECTOMY     COLONOSCOPY WITH PROPOFOL N/A 02/09/2020   Procedure: COLONOSCOPY WITH PROPOFOL;  Surgeon: Lemar Lofty., MD;  Location: [facility] ENDOSCOPY;  Service: Gastroenterology;  Laterality: N/A;   ESOPHAGOGASTRODUODENOSCOPY (EGD) WITH PROPOFOL N/A 02/09/2020   Procedure: ESOPHAGOGASTRODUODENOSCOPY (EGD) WITH PROPOFOL;  Surgeon: Meridee Score Netty Starring., MD;  Location: [facility] ENDOSCOPY;  Service: Gastroenterology;  Laterality: N/A;   EYE SURGERY  01/18/2011   IR FLUORO GUIDE CV LINE RIGHT  12/07/2021   IR FLUORO GUIDE CV LINE RIGHT  06/07/2022   IR PTA VENOUS EXCEPT DIALYSIS CIRCUIT  06/07/2022   IR US GUIDE VASC ACCESS RIGHT  12/07/2021   POLYPECTOMY  02/09/2020   Procedure: POLYPECTOMY;  Surgeon: Mansouraty, Netty Starring., MD;  Location: [facility] ENDOSCOPY;  Service: Gastroenterology;;   RETINAL DETACHMENT REPAIR W/ SCLERAL BUCKLE LE     Left   UPPER EXTREMITY VENOGRAPHY Left 07/17/2022   Procedure:  UPPER EXTREMITY VENOGRAPHY;  Surgeon: Maeola Harman, MD;  Location: [facility] INVASIVE CV LAB;  Service: Cardiovascular;  Laterality: Left;   Family History  Problem Relation Age  of Onset   Hypertension Mother    Hyperlipidemia Mother    Heart disease Mother    Kidney disease Mother    Depression Mother    Stomach cancer Father    Healthy Sister        x2   Healthy Brother        x2   Hypertension Maternal Grandmother    Stroke Maternal Grandmother    Heart disease Maternal Grandmother    Hyperlipidemia Maternal Grandmother    Stroke Maternal Grandfather    Hypertension Maternal Grandfather    Heart disease Maternal Grandfather    Hyperlipidemia Maternal Grandfather    Alzheimer's disease Maternal Grandfather    Cancer - Other Paternal Grandmother    Healthy Daughter        x1   Healthy Son        x2   Diabetes Neg Hx    Heart attack Neg Hx    Sudden death Neg Hx    Colon cancer Neg Hx    Esophageal cancer Neg Hx    Pancreatic cancer Neg Hx    Inflammatory bowel disease Neg Hx    Liver disease Neg Hx    Rectal cancer Neg Hx    Outpatient Medications Prior to Visit  Medication Sig Dispense Refill   albuterol (VENTOLIN HFA) 108 (90 Base) MCG/ACT inhaler Inhale 1-2 puffs into the lungs every 6 (six) hours as needed for wheezing (cough). 8 g 2   allopurinol (ZYLOPRIM) 300 MG tablet Take 0.5 tablets (150 mg total) by mouth every dialysis (after dialysis). 15 tablet 0   carvedilol (COREG) 25 MG tablet TAKE 1 TABLET(25 MG) BY MOUTH TWICE DAILY WITH A MEAL 60 tablet 3   colchicine 0.6 MG tablet Take 0.6 mg by mouth.     dorzolamide-timolol (COSOPT) 22.3-6.8 MG/ML ophthalmic solution Place 1 drop into the left eye 2 (two) times daily.     Doxercalciferol (HECTOROL IV) as directed. On dialysis days     ipratropium (ATROVENT) 0.03 % nasal spray Place 2 sprays into both nostrils every 12 (twelve) hours. (Patient taking differently: Place 2 sprays into both nostrils 2 (two) times daily as needed for rhinitis.) 30 mL 12   isosorbide mononitrate (IMDUR) 60 MG 24 hr tablet Take 60 mg by mouth daily.     latanoprost (XALATAN) 0.005 % ophthalmic solution Place 1  drop into the left eye at bedtime.     lidocaine-prilocaine (EMLA) cream Apply 1 Application topically once. Dialysis days     LINZESS 145 MCG CAPS capsule TAKE 1 CAPSULE(145 MCG) BY MOUTH DAILY BEFORE BREAKFAST. GIVEN** (Patient taking differently: Take 145 mcg by mouth daily before breakfast.) 90 capsule 3   sevelamer carbonate (RENVELA) 800 MG tablet Take 800 mg by mouth 3 (three) times daily with meals.     XPHOZAH 30 MG TABS Take 30 mg by mouth 2 (two) times daily.     HYDROcodone-acetaminophen (NORCO) 5-325 MG tablet Take 1 tablet by mouth every 6 (six) hours as needed for moderate pain. (Patient not taking: Reported on 06/21/2022) 10 tablet 0   Facility-Administered Medications Prior to Visit  Medication Dose Route Frequency Provider Last Rate Last Admin   0.9 %  sodium chloride infusion  250 mL Intravenous PRN Maeola Harman, MD       sodium  chloride flush (NS) 0.9 % injection 3 mL  3 mL Intravenous Q12H Maeola Harman, MD       No Known Allergies   Objective:   Today's Vitals   07/19/22 1550  BP: 124/78  Pulse: (!) 102  Temp: 97.6 F (36.4 C)  TempSrc: Temporal  SpO2: 97%  Weight: (!) 339 lb 12.8 oz (154.1 kg)  Height: 5' 8.5" (1.74 m)   Body mass index is 50.91 kg/m.   General: Well developed, well nourished. No acute distress. GU: Normal circumcised male. There is a white penile discharge present at the meatus, increased with milking the penis. Psych: Alert and oriented. Normal mood and affect.  Health Maintenance Due  Topic Date Due   Hepatitis C Screening  Never done     Assessment & Plan:   Problem List Items Addressed This Visit       Other   Penile discharge - Primary    Discharge consistent with acute urethritis. Swab obtained for testing. I will send for other STD testing. MA to give 1 gm of ceftriaxone IM. I will prescribe doxycyline 100 mg bid for 7 days. Discussed importance of consistent condom use.      Relevant Medications    cefTRIAXone (ROCEPHIN) injection 1 g   doxycycline (VIBRA-TABS) 100 MG tablet   Other Relevant Orders   Chlamydia/GC NAA, Confirmation   RPR   HIV Antibody (routine testing w rflx)    Return if symptoms worsen or fail to improve.   Loyola Mast, MD

## 2022-07-19 NOTE — Progress Notes (Signed)
E-Visit for Urinary Problems ? ?Based on what you shared with me, I feel your condition warrants further evaluation and I recommend that you be seen for a face to face office visit.  Male bladder infections are not very common.  We worry about prostate or kidney conditions.  The standard of care is to examine the abdomen and kidneys, and to do a urine and blood test to make sure that something more serious is not going on.  We recommend that you see a provider today.  If your doctor's office is closed [facility] has the following Urgent Cares: ? ?  ?NOTE: You will not be charged for this e-visit. ? ?If you are having a true medical emergency please call 911.   ? ?  ? For an urgent face to face visit, [facility] has six urgent care centers for your convenience:  ?  ? [facility] [place] ?Get Driving Directions ?[phone] ?[address] ?[place], [place] [address] ?  ? [facility] ([place] ?Get Driving Directions ?[phone] ?[address] ?[place], [place] [address] ? ?[facility] [facility] [place]) ?Get Driving Directions ?[phone] ?[address] ?[place],  [place]  [address] ? ?[facility] ?Get Driving Directions ?[phone] ?[address] ?[place], [place] [address] ?  ?[facility] ?Get Driving Directions  ?[phone] ?[address] ?[address] ?[place], [place] [address] ?  ?[facility] [place] ?Get Driving Directions ?[phone] ?[address] ?[place],  [address] ? ?Your MyChart E-visit questionnaire answers were reviewed by a board certified advanced clinical practitioner to complete your personal care plan based on your specific symptoms.  Thank you for using e-Visits. ?

## 2022-07-19 NOTE — Assessment & Plan Note (Signed)
Discharge consistent with acute urethritis. Swab obtained for testing. I will send for other STD testing. MA to give 1 gm of ceftriaxone IM. I will prescribe doxycyline 100 mg bid for 7 days. Discussed importance of consistent condom use.

## 2022-07-20 LAB — RPR: RPR Ser Ql: NONREACTIVE

## 2022-07-20 LAB — HIV ANTIBODY (ROUTINE TESTING W REFLEX): HIV Screen 4th Generation wRfx: NONREACTIVE

## 2022-07-21 ENCOUNTER — Telehealth: Payer: Self-pay

## 2022-07-21 ENCOUNTER — Other Ambulatory Visit: Payer: Self-pay

## 2022-07-21 DIAGNOSIS — N186 End stage renal disease: Secondary | ICD-10-CM

## 2022-07-21 LAB — CHLAMYDIA/GC NAA, CONFIRMATION
Chlamydia trachomatis, NAA: NEGATIVE
Neisseria gonorrhoeae, NAA: NEGATIVE

## 2022-07-21 MED FILL — Lidocaine HCl Local Preservative Free (PF) Inj 1%: INTRAMUSCULAR | Qty: 30 | Status: AC

## 2022-07-21 NOTE — Telephone Encounter (Signed)
Patient returned call. He requested to schedule surgery in August. Surgery scheduled for 8/16 with instructions given verbally and sent via Mychart. Patient verbalized understanding.

## 2022-07-21 NOTE — Telephone Encounter (Signed)
Attempted to reach patient to schedule left arm AVF/AVG, but no answer. Left VM for patient to return call.

## 2022-07-22 LAB — GC/CHLAMYDIA PROBE AMP
Chlamydia trachomatis, NAA: NEGATIVE
Neisseria Gonorrhoeae by PCR: NEGATIVE

## 2022-08-05 ENCOUNTER — Encounter (HOSPITAL_COMMUNITY): Payer: Self-pay | Admitting: Emergency Medicine

## 2022-08-05 ENCOUNTER — Emergency Department (HOSPITAL_COMMUNITY): Payer: Managed Care, Other (non HMO)

## 2022-08-05 ENCOUNTER — Emergency Department (HOSPITAL_COMMUNITY)
Admission: EM | Admit: 2022-08-05 | Discharge: 2022-08-05 | Disposition: A | Discharge status: Home / Self Care | Payer: Managed Care, Other (non HMO) | Attending: Emergency Medicine | Admitting: Emergency Medicine

## 2022-08-05 ENCOUNTER — Other Ambulatory Visit: Payer: Self-pay

## 2022-08-05 DIAGNOSIS — T829XXA Unspecified complication of cardiac and vascular prosthetic device, implant and graft, initial encounter: Secondary | ICD-10-CM

## 2022-08-05 DIAGNOSIS — N186 End stage renal disease: Secondary | ICD-10-CM | POA: Diagnosis not present

## 2022-08-05 DIAGNOSIS — T8249XA Other complication of vascular dialysis catheter, initial encounter: Secondary | ICD-10-CM | POA: Diagnosis present

## 2022-08-05 DIAGNOSIS — Y828 Other medical devices associated with adverse incidents: Secondary | ICD-10-CM | POA: Diagnosis not present

## 2022-08-05 DIAGNOSIS — Z992 Dependence on renal dialysis: Secondary | ICD-10-CM | POA: Diagnosis not present

## 2022-08-05 MED ORDER — ALTEPLASE 2 MG IJ SOLR
2.0000 mg | Freq: Once | INTRAMUSCULAR | Status: AC
  Administered 2022-08-05: 2 mg
  Filled 2022-08-05: qty 2

## 2022-08-05 NOTE — ED Notes (Signed)
IV team at bedside 

## 2022-08-05 NOTE — ED Triage Notes (Signed)
Pt. Stated I was at dialysis and my catheter is clogged and told to come here to de clogged. I need a treatment today.

## 2022-08-05 NOTE — ED Provider Notes (Signed)
[place] EMERGENCY DEPARTMENT AT [facility] Provider Note   CSN: [id number] Arrival date & time: 08/05/22  2725     History  Chief Complaint  Patient presents with   Vascular Access Problem    Frank Coffey is a 42 y.o. male.  42 year old male presents today for concern of vascular access issue for dialysis.  He went to dialysis center today and they were unable to access the dialysis catheter.  He is on Tuesday, Thursday, Saturday schedule.        Home Medications Prior to Admission medications   Medication Sig Start Date End Date Taking? Authorizing Provider  albuterol (VENTOLIN HFA) 108 (90 Base) MCG/ACT inhaler Inhale 1-2 puffs into the lungs every 6 (six) hours as needed for wheezing (cough). 06/09/21   Mliss Sax, MD  allopurinol (ZYLOPRIM) 300 MG tablet Take 0.5 tablets (150 mg total) by mouth every dialysis (after dialysis). 05/27/22   Claiborne Rigg, NP  carvedilol (COREG) 25 MG tablet TAKE 1 TABLET(25 MG) BY MOUTH TWICE DAILY WITH A MEAL 05/25/22   Chandrasekhar, Mahesh A, MD  colchicine 0.6 MG tablet Take 0.6 mg by mouth. 07/07/22   [provider]  dorzolamide-timolol (COSOPT) 22.3-6.8 MG/ML ophthalmic solution Place 1 drop into the left eye 2 (two) times daily. 03/10/19   [provider]  Doxercalciferol (HECTOROL IV) as directed. On dialysis days 03/04/22 03/03/23  [provider]  doxycycline (VIBRA-TABS) 100 MG tablet Take 1 tablet (100 mg total) by mouth 2 (two) times daily. 07/19/22   Loyola Mast, MD  ipratropium (ATROVENT) 0.03 % nasal spray Place 2 sprays into both nostrils every 12 (twelve) hours. Patient taking differently: Place 2 sprays into both nostrils 2 (two) times daily as needed for rhinitis. 04/07/22   Viviano Simas, FNP  isosorbide mononitrate (IMDUR) 60 MG 24 hr tablet Take 60 mg by mouth daily.    [provider]  latanoprost (XALATAN) 0.005 % ophthalmic solution Place 1 drop into the left  eye at bedtime. 04/14/19   [provider]  lidocaine-prilocaine (EMLA) cream Apply 1 Application topically once. Dialysis days 02/21/22   [provider]  LINZESS 145 MCG CAPS capsule TAKE 1 CAPSULE(145 MCG) BY MOUTH DAILY BEFORE BREAKFAST. GIVEN** Patient taking differently: Take 145 mcg by mouth daily before breakfast. 10/31/21   Nche, Bonna Gains, NP  sevelamer carbonate (RENVELA) 800 MG tablet Take 800 mg by mouth 3 (three) times daily with meals. 05/23/22   [provider]  XPHOZAH 30 MG TABS Take 30 mg by mouth 2 (two) times daily. 06/19/22   [provider]      Allergies    Patient has no known allergies.    Review of Systems   Review of Systems  Constitutional:  Negative for fever.  Respiratory:  Negative for shortness of breath.   Cardiovascular:  Negative for chest pain.  Neurological:  Negative for light-headedness.  All other systems reviewed and are negative.   Physical Exam Updated Vital Signs BP (!) 125/96   Pulse 94   Temp 98.7 F (37.1 C)   Resp 20   SpO2 95%  Physical Exam Vitals and nursing note reviewed.  Constitutional:      General: He is not in acute distress.    Appearance: Normal appearance. He is not ill-appearing.  HENT:     Head: Normocephalic and atraumatic.     Nose: Nose normal.  Eyes:     General: No scleral icterus.  Extraocular Movements: Extraocular movements intact.     Conjunctiva/sclera: Conjunctivae normal.  Cardiovascular:     Rate and Rhythm: Normal rate and regular rhythm.     Heart sounds: Normal heart sounds.  Pulmonary:     Effort: Pulmonary effort is normal. No respiratory distress.     Breath sounds: Normal breath sounds. No wheezing or rales.  Musculoskeletal:        General: Normal range of motion.     Cervical back: Normal range of motion.  Skin:    General: Skin is warm and dry.  Neurological:     General: No focal deficit present.     Mental Status: He is alert and oriented to  person, place, and time. Mental status is at baseline.     ED Results / Procedures / Treatments   Labs (all labs ordered are listed, but only abnormal results are displayed) Labs Reviewed - No data to display  EKG None  Radiology DG Chest 2 View  Result Date: 08/05/2022 CLINICAL DATA:  Nonfunctioning dialysis catheter. EXAM: CHEST - 2 VIEW COMPARISON:  12/06/2021 FINDINGS: Low volume film. Vascular congestion with probable atelectasis in the bases, left greater than right. Cardiopericardial silhouette is at upper limits of normal for size. Right IJ dialysis catheter tip overlies the SVC/RA junction. No evidence for kink within the course of the catheter. IMPRESSION: 1. Low volume film with vascular congestion and probable atelectasis in the bases, left greater than right. 2. Right IJ dialysis catheter tip overlies the SVC/RA junction. Electronically Signed   By: Kennith Center M.D.   On: 08/05/2022 12:22    Procedures Procedures    Medications Ordered in ED Medications  alteplase (CATHFLO ACTIVASE) injection 2 mg (2 mg Intracatheter Given 08/05/22 1203)  alteplase (CATHFLO ACTIVASE) injection 2 mg (2 mg Intracatheter Given 08/05/22 1202)    ED Course/ Medical Decision Making/ A&P                             Medical Decision Making Amount and/or Complexity of Data Reviewed Radiology: ordered.  Risk Prescription drug management.   42 year old male presents today for concern of vascular access issue.  He presented to his dialysis center and they were unable to access the catheter and he was referred to the emergency department.  He is without any signs or symptoms of volume overload. X-ray obtained.  No obstruction or kinks.  IV team consulted.  They were able to declot the catheter.  Patient reached out to his dialysis center.  They are able to accept him back today for his regular dialysis session.  Cathflo left in.  I spoke to the IV team prior to discharge.  They state if  dialysis center is able to remove it that he can be discharged with the medicine still in place.  I spoke with the dialysis center.  They state they are comfortable removing the medicine.  Patient discharged in stable condition.  Return precautions discussed.   Final Clinical Impression(s) / ED Diagnoses Final diagnoses:  ESRD (end stage renal disease) (HCC)  Complication of vascular access for dialysis, initial encounter    Rx / DC Orders ED Discharge Orders     None         Marita Kansas, PA-C 08/05/22 1248    Gwyneth Sprout, MD 08/06/22 1322

## 2022-08-05 NOTE — Discharge Instructions (Signed)
Your dialysis catheter was declotted.  You are able to speak with your dialysis center.  They will be able to dialyze you today.  For any concerning symptoms return to the emergency room.

## 2022-08-07 ENCOUNTER — Emergency Department (HOSPITAL_COMMUNITY)
Admission: EM | Admit: 2022-08-07 | Discharge: 2022-08-07 | Discharge status: Against Medical Advice | Payer: Managed Care, Other (non HMO)

## 2022-08-07 NOTE — ED Notes (Signed)
Pt not responding to be triaged

## 2022-08-18 ENCOUNTER — Encounter: Payer: Self-pay | Admitting: Internal Medicine

## 2022-08-18 ENCOUNTER — Ambulatory Visit: Payer: Managed Care, Other (non HMO) | Attending: Internal Medicine | Admitting: Internal Medicine

## 2022-08-31 ENCOUNTER — Other Ambulatory Visit: Payer: Self-pay

## 2022-08-31 ENCOUNTER — Encounter (HOSPITAL_COMMUNITY): Payer: Self-pay | Admitting: Vascular Surgery

## 2022-08-31 NOTE — Progress Notes (Signed)
SDW call  Patient was given pre-op instructions over the phone. Patient verbalized understanding of instructions provided.     PCP - Anne Ng, NP Cardiologist - Dr. Riley Lam Pulmonary:    PPM/ICD - denies Device Orders - n/a Rep Notified - n/a   Chest x-ray - 08/05/2022 EKG -  12/09/2021 Stress Test -04/28/2022 ECHO - 11/08/2021 Cardiac Cath -   Sleep Study/sleep apnea/CPAP: Diagnosed with sleep apnea, does not wear his CPAP  Non-diabetic  Blood Thinner Instructions: denies Aspirin Instructions:denies   ERAS Protcol - No, NPO   COVID TEST- n/a    Anesthesia review:  Yes. HTN, CHF, sleep apnea, CKD, dialysis, high cholesterol, morbid obesity   Patient denies shortness of breath, fever, cough and chest pain over the phone call  Your procedure is scheduled on Friday September 01, 2022  Report to [facility] Main Entrance "A" at 0530 A.M., then check in with the Admitting office.  Call this number if you have problems the morning of surgery:  [phone]   If you have any questions prior to your surgery date call [phone]: Open Monday-Friday 8am-4pm If you experience any cold or flu symptoms such as cough, fever, chills, shortness of breath, etc. between now and your scheduled surgery, please notify us at the above number    Remember:  Do not eat or drink after midnight the night before your surgery  Take these medicines the morning of surgery with A SIP OF WATER:  Carvedilol, colchicine, cosopt eye drops, imdur, xalatan eye drops,   As needed: Albuterol, atrovent nasal spray  As of today, STOP taking any Aspirin (unless otherwise instructed by your surgeon) Aleve, Naproxen, Ibuprofen, Motrin, Advil, Goody's, BC's, all herbal medications, fish oil, and all vitamins.

## 2022-08-31 NOTE — Progress Notes (Deleted)
[facility] Cancer Initial Visit:  Patient Care Team: Nche, Bonna Gains, NP as PCP - General (Internal Medicine) Christell Constant, MD as PCP - Cardiology (Cardiology) Karie Soda, MD as Consulting Physician (General Surgery) Chilton Si, MD as Attending Physician (Cardiology) Deterding, Fayrene Fearing, MD as Consulting Physician (Nephrology)  CHIEF COMPLAINTS/PURPOSE OF CONSULTATION:  Oncology History   No history exists.    HISTORY OF PRESENTING ILLNESS: Frank Coffey 42 y.o. 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)    IBS (irritable bowel syndrome)    Obesity    OSA (obstructive sleep apnea)    Pneumonia 04/2019   with Covid   Sleep apnea     SURGICAL HISTORY: Past Surgical History:  Procedure Laterality Date   AV FISTULA PLACEMENT Right 02/17/2022   Procedure: RIGHT ARTERIOVENOUS (AV) FISTULA CREATION;  Surgeon: Maeola Harman, MD;  Location: [facility] OR;  Service: Vascular;  Laterality: Right;   AV FISTULA PLACEMENT Right 05/26/2022   Procedure: RIGHT ARM BRACHIOCEPHALIC ARTERIOVENOUS (AV) FISTULA CREATION;  Surgeon: Maeola Harman, MD;  Location[facility] OR;  Service: Vascular;  Laterality: Right;   BIOPSY  02/09/2020   Procedure: BIOPSY;  Surgeon: Lemar Lofty., MD;  Location[facility] ENDOSCOPY;  Service: Gastroenterology;;   CHOLECYSTECTOMY     COLONOSCOPY WITH PROPOFOL N/A 02/09/2020   Procedure: COLONOSCOPY WITH PROPOFOL;  Surgeon: Lemar Lofty., MD;  Location: [facility] ENDOSCOPY;  Service: Gastroenterology;  Laterality: N/A;   ESOPHAGOGASTRODUODENOSCOPY (EGD) WITH PROPOFOL N/A 02/09/2020   Procedure: ESOPHAGOGASTRODUODENOSCOPY (EGD) WITH PROPOFOL;  Surgeon: Meridee Score Netty Starring., MD;  Location: [facility] ENDOSCOPY;  Service: Gastroenterology;  Laterality: N/A;   EYE SURGERY  01/18/2011   IR FLUORO GUIDE CV LINE RIGHT  12/07/2021   IR FLUORO GUIDE CV LINE RIGHT  06/07/2022   IR PTA VENOUS EXCEPT DIALYSIS CIRCUIT  06/07/2022   IR US GUIDE VASC ACCESS RIGHT  12/07/2021   POLYPECTOMY  02/09/2020   Procedure: POLYPECTOMY;  Surgeon: Mansouraty, Netty Starring., MD;  Location: [facility] ENDOSCOPY;  Service: Gastroenterology;;   RETINAL DETACHMENT REPAIR W/ SCLERAL BUCKLE LE     Left   UPPER EXTREMITY VENOGRAPHY Left 07/17/2022   Procedure: UPPER EXTREMITY VENOGRAPHY;  Surgeon: Maeola Harman, MD;  Location: [facility] INVASIVE CV LAB;  Service: Cardiovascular;  Laterality: Left;    SOCIAL HISTORY: Social History   Socioeconomic History   Marital status: Divorced    Spouse name: Not on file   Number of children: Not on file   Years of education: Not on file   Highest education level: Not on file  Occupational History   Not on file  Tobacco Use   Smoking status: Never   Smokeless tobacco: Never  Vaping Use   Vaping status: Never Used  Substance and Sexual Activity   Alcohol use: Not Currently    Comment: occ   Drug use: Never   Sexual activity: Yes    Partners: Female    Birth control/protection: None  Other Topics Concern   Not on file  Social History Narrative   Epworth Sleepiness Scale = 10 (as of 12/01/14)   Social Determinants of Health   Financial Resource Strain: Not on file  Food Insecurity: Not on file  Transportation Needs: Not on  file  Physical Activity: Not on file  Stress: Not on file  Social Connections: Not on file  Intimate Partner Violence: Not on file    FAMILY HISTORY Family History  Problem Relation Age of Onset   Hypertension Mother    Hyperlipidemia Mother    Heart disease Mother    Kidney disease Mother    Depression Mother    Stomach cancer Father    Healthy Sister        x2   Healthy Brother        x2   Hypertension Maternal Grandmother    Stroke Maternal Grandmother    Heart disease Maternal Grandmother    Hyperlipidemia Maternal Grandmother    Stroke Maternal Grandfather    Hypertension Maternal Grandfather    Heart disease Maternal Grandfather    Hyperlipidemia Maternal Grandfather    Alzheimer's disease Maternal Grandfather    Cancer - Other Paternal Grandmother    Healthy Daughter        x1   Healthy Son        x2   Diabetes Neg Hx    Heart attack Neg Hx    Sudden death Neg Hx    Colon cancer Neg Hx    Esophageal cancer Neg Hx    Pancreatic cancer Neg Hx    Inflammatory bowel disease Neg Hx    Liver disease Neg Hx    Rectal cancer Neg Hx     ALLERGIES:  has No Known Allergies.  MEDICATIONS:  Current Outpatient Medications  Medication Sig Dispense Refill   albuterol (VENTOLIN HFA) 108 (90 Base) MCG/ACT inhaler Inhale 1-2 puffs into the lungs every 6 (six) hours as needed for wheezing (cough). 8 g 2   allopurinol (ZYLOPRIM) 300 MG tablet Take 0.5 tablets (150 mg total) by mouth every dialysis (after dialysis). 15 tablet 0   carvedilol (COREG) 25 MG tablet TAKE 1 TABLET(25 MG) BY MOUTH TWICE DAILY WITH A MEAL 60 tablet 3   colchicine 0.6 MG tablet Take 0.6 mg by mouth.     dorzolamide-timolol (COSOPT) 22.3-6.8 MG/ML ophthalmic solution Place 1 drop into the left eye 2 (two) times daily.     Doxercalciferol (HECTOROL IV) as directed. On dialysis days     doxycycline (VIBRA-TABS) 100 MG tablet Take 1 tablet (100 mg total) by mouth 2 (two) times daily. (Patient not taking:  Reported on 08/31/2022) 14 tablet 0   ipratropium (ATROVENT) 0.03 % nasal spray Place 2 sprays into both nostrils every 12 (twelve) hours. (Patient taking differently: Place 2 sprays into both nostrils 2 (two) times daily as needed for rhinitis.) 30 mL 12   isosorbide mononitrate (IMDUR) 60 MG 24 hr tablet Take 60 mg by mouth daily.     latanoprost (XALATAN) 0.005 % ophthalmic solution Place 1 drop into the left eye at bedtime.     lidocaine-prilocaine (EMLA) cream Apply 1 Application topically once. Dialysis days     LINZESS 145  MCG CAPS capsule TAKE 1 CAPSULE(145 MCG) BY MOUTH DAILY BEFORE BREAKFAST. GIVEN** (Patient taking differently: Take 145 mcg by mouth daily before breakfast.) 90 capsule 3   sevelamer carbonate (RENVELA) 800 MG tablet Take 800 mg by mouth 3 (three) times daily with meals.     XPHOZAH 30 MG TABS Take 30 mg by mouth 2 (two) times daily.     Current Facility-Administered Medications  Medication Dose Route Frequency Provider Last Rate Last Admin   0.9 %  sodium chloride infusion  250 mL Intravenous PRN Maeola Harman, MD       sodium chloride flush (NS) 0.9 % injection 3 mL  3 mL Intravenous Q12H Maeola Harman, MD        PHYSICAL EXAMINATION:  ECOG PERFORMANCE STATUS: {CHL ONC ECOG ZO:[id number]}   There were no vitals filed for this visit.  There were no vitals filed for this visit.   Physical Exam   LABORATORY DATA: I have personally reviewed the data as listed:  No visits with results within 1 Month(s) from this visit.  Latest known visit with results is:  Office Visit on 07/19/2022  Component Date Value Ref Range Status   Chlamydia trachomatis, NAA 07/19/2022 Negative  Negative Final   Neisseria gonorrhoeae, NAA 07/19/2022 Negative  Negative Final   RPR Ser Ql 07/19/2022 Non Reactive  Non Reactive Final   HIV Screen 4th Generation wRfx 07/19/2022 Non Reactive  Non Reactive Final   Comment: HIV-1/HIV-2 antibodies and HIV-1 p24  antigen were NOT detected. There is no laboratory evidence of HIV infection. HIV Negative    Chlamydia trachomatis, NAA 07/19/2022 Negative  Negative Final   Neisseria Gonorrhoeae by PCR 07/19/2022 Negative  Negative Final    RADIOGRAPHIC STUDIES: I have personally reviewed the radiological images as listed and agree with the findings in the report  No results found.  ASSESSMENT/PLAN Cancer Staging  No matching staging information was found for the patient.    No problem-specific Assessment & Plan notes found for this encounter.    No orders of the defined types were placed in this encounter.     minutes was spent in patient care.  This included time spent preparing to see the patient (e.g., review of tests), obtaining and/or reviewing separately obtained history, counseling and educating the patient/family/caregiver, ordering medications, tests, or procedures; documenting clinical information in the electronic or other health record, independently interpreting results and communicating results to the patient/family/caregiver as well as coordination of care.       All questions were answered. The patient knows to call the clinic with any problems, questions or concerns.  This note was electronically signed.    Loni Muse, MD  08/31/2022 2:13 PM

## 2022-09-01 ENCOUNTER — Other Ambulatory Visit: Payer: Self-pay

## 2022-09-01 ENCOUNTER — Encounter (HOSPITAL_COMMUNITY): Payer: Self-pay | Admitting: Vascular Surgery

## 2022-09-01 ENCOUNTER — Ambulatory Visit (HOSPITAL_COMMUNITY)
Admission: RE | Admit: 2022-09-01 | Discharge: 2022-09-01 | Disposition: A | Discharge status: Home / Self Care | Payer: Managed Care, Other (non HMO) | Source: Ambulatory Visit | Attending: Vascular Surgery | Admitting: Vascular Surgery

## 2022-09-01 ENCOUNTER — Inpatient Hospital Stay: Payer: Managed Care, Other (non HMO) | Attending: Oncology | Admitting: Oncology

## 2022-09-01 ENCOUNTER — Ambulatory Visit (HOSPITAL_COMMUNITY): Payer: Managed Care, Other (non HMO) | Admitting: Physician Assistant

## 2022-09-01 ENCOUNTER — Encounter (HOSPITAL_COMMUNITY)
Admission: RE | Disposition: A | Discharge status: Home / Self Care | Payer: Self-pay | Source: Ambulatory Visit | Attending: Vascular Surgery

## 2022-09-01 ENCOUNTER — Inpatient Hospital Stay: Payer: Managed Care, Other (non HMO)

## 2022-09-01 DIAGNOSIS — K589 Irritable bowel syndrome without diarrhea: Secondary | ICD-10-CM | POA: Diagnosis not present

## 2022-09-01 DIAGNOSIS — N185 Chronic kidney disease, stage 5: Secondary | ICD-10-CM

## 2022-09-01 DIAGNOSIS — Z992 Dependence on renal dialysis: Secondary | ICD-10-CM | POA: Insufficient documentation

## 2022-09-01 DIAGNOSIS — G4733 Obstructive sleep apnea (adult) (pediatric): Secondary | ICD-10-CM | POA: Insufficient documentation

## 2022-09-01 DIAGNOSIS — I132 Hypertensive heart and chronic kidney disease with heart failure and with stage 5 chronic kidney disease, or end stage renal disease: Secondary | ICD-10-CM | POA: Diagnosis not present

## 2022-09-01 DIAGNOSIS — N189 Chronic kidney disease, unspecified: Secondary | ICD-10-CM | POA: Diagnosis present

## 2022-09-01 DIAGNOSIS — Z87442 Personal history of urinary calculi: Secondary | ICD-10-CM | POA: Insufficient documentation

## 2022-09-01 DIAGNOSIS — E669 Obesity, unspecified: Secondary | ICD-10-CM | POA: Insufficient documentation

## 2022-09-01 DIAGNOSIS — I509 Heart failure, unspecified: Secondary | ICD-10-CM | POA: Diagnosis not present

## 2022-09-01 DIAGNOSIS — E785 Hyperlipidemia, unspecified: Secondary | ICD-10-CM | POA: Insufficient documentation

## 2022-09-01 DIAGNOSIS — D631 Anemia in chronic kidney disease: Secondary | ICD-10-CM | POA: Diagnosis not present

## 2022-09-01 DIAGNOSIS — K219 Gastro-esophageal reflux disease without esophagitis: Secondary | ICD-10-CM | POA: Insufficient documentation

## 2022-09-01 DIAGNOSIS — N186 End stage renal disease: Secondary | ICD-10-CM | POA: Diagnosis not present

## 2022-09-01 HISTORY — PX: AV FISTULA PLACEMENT: SHX1204

## 2022-09-01 LAB — POCT I-STAT, CHEM 8
BUN: 45 mg/dL — ABNORMAL HIGH (ref 6–20)
Calcium, Ion: 1.09 mmol/L — ABNORMAL LOW (ref 1.15–1.40)
Chloride: 99 mmol/L (ref 98–111)
Creatinine, Ser: 11.5 mg/dL — ABNORMAL HIGH (ref 0.61–1.24)
Glucose, Bld: 98 mg/dL (ref 70–99)
HCT: 33 % — ABNORMAL LOW (ref 39.0–52.0)
Hemoglobin: 11.2 g/dL — ABNORMAL LOW (ref 13.0–17.0)
Potassium: 4.2 mmol/L (ref 3.5–5.1)
Sodium: 137 mmol/L (ref 135–145)
TCO2: 25 mmol/L (ref 22–32)

## 2022-09-01 SURGERY — ARTERIOVENOUS (AV) FISTULA CREATION
Anesthesia: Regional | Laterality: Left

## 2022-09-01 MED ORDER — 0.9 % SODIUM CHLORIDE (POUR BTL) OPTIME
TOPICAL | Status: DC | PRN
  Administered 2022-09-01: 1000 mL

## 2022-09-01 MED ORDER — HEPARIN 6000 UNIT IRRIGATION SOLUTION
Status: DC | PRN
  Administered 2022-09-01: 1

## 2022-09-01 MED ORDER — PROMETHAZINE HCL 25 MG/ML IJ SOLN: 6.2500 mg | INTRAMUSCULAR | Status: DC | PRN

## 2022-09-01 MED ORDER — HEPARIN 6000 UNIT IRRIGATION SOLUTION
Status: AC
  Filled 2022-09-01: qty 500

## 2022-09-01 MED ORDER — ACETAMINOPHEN 325 MG PO TABS: 325.0000 mg | ORAL_TABLET | ORAL | Status: DC | PRN

## 2022-09-01 MED ORDER — ACETAMINOPHEN 10 MG/ML IV SOLN: 1000.0000 mg | Freq: Once | INTRAVENOUS | Status: DC | PRN

## 2022-09-01 MED ORDER — LIDOCAINE-EPINEPHRINE (PF) 1 %-1:200000 IJ SOLN
INTRAMUSCULAR | Status: AC
  Filled 2022-09-01: qty 30

## 2022-09-01 MED ORDER — OXYCODONE HCL 5 MG PO TABS: 5.0000 mg | ORAL_TABLET | Freq: Once | ORAL | Status: DC | PRN

## 2022-09-01 MED ORDER — SODIUM CHLORIDE 0.9 % IV SOLN: INTRAVENOUS | Status: DC

## 2022-09-01 MED ORDER — ORAL CARE MOUTH RINSE: 15.0000 mL | Freq: Once | OROMUCOSAL | Status: AC

## 2022-09-01 MED ORDER — ACETAMINOPHEN 160 MG/5ML PO SOLN: 325.0000 mg | ORAL | Status: DC | PRN

## 2022-09-01 MED ORDER — FENTANYL CITRATE (PF) 100 MCG/2ML IJ SOLN: 25.0000 ug | INTRAMUSCULAR | Status: DC | PRN

## 2022-09-01 MED ORDER — DEXMEDETOMIDINE HCL IN NACL 400 MCG/100ML IV SOLN
INTRAVENOUS | Status: DC | PRN
  Administered 2022-09-01: .2 ug/kg/h via INTRAVENOUS

## 2022-09-01 MED ORDER — ASPIRIN 81 MG PO TBEC
81.0000 mg | DELAYED_RELEASE_TABLET | Freq: Once | ORAL | Status: DC
  Filled 2022-09-01: qty 1

## 2022-09-01 MED ORDER — ONDANSETRON HCL 4 MG/2ML IJ SOLN
INTRAMUSCULAR | Status: DC | PRN
  Administered 2022-09-01: 4 mg via INTRAVENOUS

## 2022-09-01 MED ORDER — LACTATED RINGERS IV SOLN: INTRAVENOUS | Status: DC

## 2022-09-01 MED ORDER — CHLORHEXIDINE GLUCONATE 4 % EX SOLN: 60.0000 mL | Freq: Once | CUTANEOUS | Status: DC

## 2022-09-01 MED ORDER — CEFAZOLIN IN SODIUM CHLORIDE 3-0.9 GM/100ML-% IV SOLN
3.0000 g | INTRAVENOUS | Status: AC
  Administered 2022-09-01: 3 g via INTRAVENOUS
  Filled 2022-09-01: qty 100

## 2022-09-01 MED ORDER — ASPIRIN 81 MG PO TBEC: 81.0000 mg | DELAYED_RELEASE_TABLET | Freq: Once | ORAL | Status: AC

## 2022-09-01 MED ORDER — ACETAMINOPHEN 500 MG PO TABS: 1000.0000 mg | ORAL_TABLET | Freq: Once | ORAL | Status: DC

## 2022-09-01 MED ORDER — CHLORHEXIDINE GLUCONATE 0.12 % MT SOLN: 15.0000 mL | Freq: Once | OROMUCOSAL | Status: AC

## 2022-09-01 MED ORDER — PROPOFOL 10 MG/ML IV BOLUS
INTRAVENOUS | Status: AC
  Filled 2022-09-01: qty 20

## 2022-09-01 MED ORDER — HYDROCODONE-ACETAMINOPHEN 5-325 MG PO TABS: 1.0000 | ORAL_TABLET | Freq: Four times a day (QID) | ORAL | 0 refills | Status: DC | PRN

## 2022-09-01 MED ORDER — MIDAZOLAM HCL 2 MG/2ML IJ SOLN
INTRAMUSCULAR | Status: DC | PRN
  Administered 2022-09-01: 2 mg via INTRAVENOUS

## 2022-09-01 MED ORDER — LIDOCAINE-EPINEPHRINE (PF) 1.5 %-1:200000 IJ SOLN
INTRAMUSCULAR | Status: DC | PRN
  Administered 2022-09-01: 30 mL via PERINEURAL

## 2022-09-01 MED ORDER — ASPIRIN 325 MG PO TBEC
325.0000 mg | DELAYED_RELEASE_TABLET | ORAL | Status: AC
  Administered 2022-09-01: 325 mg via ORAL
  Filled 2022-09-01: qty 1

## 2022-09-01 MED ORDER — MIDAZOLAM HCL 2 MG/2ML IJ SOLN
INTRAMUSCULAR | Status: AC
  Filled 2022-09-01: qty 2

## 2022-09-01 MED ORDER — DROPERIDOL 2.5 MG/ML IJ SOLN: 0.6250 mg | Freq: Once | INTRAMUSCULAR | Status: DC | PRN

## 2022-09-01 MED ORDER — OXYCODONE HCL 5 MG/5ML PO SOLN: 5.0000 mg | Freq: Once | ORAL | Status: DC | PRN

## 2022-09-01 MED ORDER — SODIUM CHLORIDE 0.9 % IV SOLN: INTRAVENOUS | Status: DC | PRN

## 2022-09-01 MED ORDER — CHLORHEXIDINE GLUCONATE 0.12 % MT SOLN
OROMUCOSAL | Status: AC
  Administered 2022-09-01: 15 mL via OROMUCOSAL
  Filled 2022-09-01: qty 15

## 2022-09-01 MED ORDER — FENTANYL CITRATE (PF) 250 MCG/5ML IJ SOLN
INTRAMUSCULAR | Status: DC | PRN
  Administered 2022-09-01: 50 ug via INTRAVENOUS

## 2022-09-01 MED ORDER — FENTANYL CITRATE (PF) 250 MCG/5ML IJ SOLN
INTRAMUSCULAR | Status: AC
  Filled 2022-09-01: qty 5

## 2022-09-01 SURGICAL SUPPLY — 32 items

## 2022-09-01 NOTE — Anesthesia Preprocedure Evaluation (Addendum)
Anesthesia Evaluation  Patient identified by MRN, date of birth, ID band Patient awake    Reviewed: Allergy & Precautions, NPO status , Patient's Chart, lab work & pertinent test results  Airway Mallampati: III  TM Distance: >3 FB Neck ROM: Full    Dental  (+) Teeth Intact   Pulmonary sleep apnea     + decreased breath sounds      Cardiovascular hypertension, Pt. on home beta blockers +CHF  + dysrhythmias  Rhythm:Regular Rate:Normal  Echo (2023): 1. Left ventricular ejection fraction, by estimation, is 35 to 40%. The  left ventricle has moderately decreased function. The left ventricle  demonstrates global hypokinesis. Left ventricular diastolic parameters are  consistent with Grade I diastolic  dysfunction (impaired relaxation).   2. Right ventricular systolic function is normal. The right ventricular  size is normal.   3. The mitral valve is normal in structure. No evidence of mitral valve  regurgitation. No evidence of mitral stenosis.   4. The aortic valve is normal in structure. Aortic valve regurgitation is  not visualized. No aortic stenosis is present.   5. Aortic dilatation noted. There is mild dilatation of the ascending  aorta, measuring 43 mm.   6. The inferior vena cava is dilated in size with >50% respiratory  variability, suggesting right atrial pressure of 8 mmHg.      Neuro/Psych  Headaches PSYCHIATRIC DISORDERS  Depression       GI/Hepatic Neg liver ROS,GERD  ,,  Endo/Other  negative endocrine ROS    Renal/GU ESRF and DialysisRenal disease     Musculoskeletal negative musculoskeletal ROS (+)    Abdominal   Peds  Hematology  (+) Blood dyscrasia, anemia   Anesthesia Other Findings   Reproductive/Obstetrics                             Anesthesia Physical Anesthesia Plan  ASA: 4  Anesthesia Plan: Regional   Post-op Pain Management: Tylenol PO (pre-op)*    Induction: Intravenous  PONV Risk Score and Plan: 2 and Ondansetron and Treatment may vary due to age or medical condition  Airway Management Planned: Natural Airway and Nasal Cannula  Additional Equipment: None  Intra-op Plan:   Post-operative Plan:   Informed Consent: I have reviewed the patients History and Physical, chart, labs and discussed the procedure including the risks, benefits and alternatives for the proposed anesthesia with the patient or authorized representative who has indicated his/her understanding and acceptance.       Plan Discussed with:   Anesthesia Plan Comments:        Anesthesia Quick Evaluation

## 2022-09-01 NOTE — Op Note (Signed)
    Patient name: ROMARION GALAN MRN: [id number] DOB: 26-Feb-1980 Sex: male  09/01/2022 Pre-operative Diagnosis: End-stage renal disease Post-operative diagnosis:  Same Surgeon:  Apolinar Junes C. Randie Heinz, MD Assistant: Clinton Gallant, PA Procedure Performed: Creation of left brachial artery to cephalic vein AV fistula  Indications: 42 year old male with history of end-stage renal disease currently dialyzing via catheter.  He has had 2 previous failed fistulas in the right upper extremity.  He is left-hand dominant.  We will plan for left arm AV fistula today in the OR.  An experienced assistant was necessary to facilitate exposure of the artery and vein and assist with anastomosis.  Findings: There was a very large vein that was evaluated throughout his upper arm measured approximately 5 mm at the antecubitum and the artery was healthy also 5 mm.  At completion there was a palpable radial artery pulse at the wrist and a very strong Doppler signal that was traced up the upper arm throughout the fistula.  Plan will be to initiate 81 mg aspirin therapy given history of fistula primary malfunction.  Patient may ultimately require transposition of this fistula given the depth of the upper arm.   Procedure:  The patient was identified in the holding area and taken to the operating room where is placed supine operative table and MAC anesthesia was induced.  Preoperative block placed.  He was sterilely prepped and draped in the left upper extremity usual fashion, antibiotics were administered a timeout was called.  Cephalic vein was evaluated the arm with ultrasound this was noted to be large throughout the upper arm suitable for access creation.  The block was checked was noted to be intact.  A transverse incision was made at the antecubitum we dissected down and the vein was mobilized for several centimeters and marked for orientation.  The deep fascia was then incised and the large brachial artery was encircled with  vessel loop.  The vein was then clamped distally and transected flushed with a benign saline and clamped proximally with the dog..  The artery was clamped distally proximally opened longitudinally and flushed with heparinized saline distally only given its large size.  The vein was then sewn into side with 6-0 Prolene suture.  Prior completion we allowed flushing in all directions.  Palpation there is a very strong thrill in the fistula likely traced up the upper arm by both palpation and Doppler and a palpable radial artery pulse at the wrist also confirmed with Doppler.  The wound was irrigated closed in layers with Vicryl and Monocryl.  He was awakened from anesthesia having tolerated the procedure well without immediate complication.  All counts were correct at completion.  EBL: 20 cc  Germaine Ripp C. Randie Heinz, MD [facility] Office: [phone] Pager: [phone]

## 2022-09-01 NOTE — Anesthesia Procedure Notes (Signed)
Anesthesia Regional Block: Supraclavicular block   Pre-Anesthetic Checklist: , timeout performed,  Correct Patient, Correct Site, Correct Laterality,  Correct Procedure, Correct Position, site marked,  Risks and benefits discussed,  Surgical consent,  Pre-op evaluation,  At surgeon's request and post-op pain management  Laterality: Left  Prep: chloraprep       Needles:  Injection technique: Single-shot  Needle Type: Echogenic Stimulator Needle     Needle Length: 9cm  Needle Gauge: 21     Additional Needles:   Procedures:,,,, ultrasound used (permanent image in chart),,    Narrative:  Start time: 09/01/2022 7:10 AM End time: 09/01/2022 7:15 AM Injection made incrementally with aspirations every 5 mL.  Performed by: Personally  Anesthesiologist: Shelton Silvas, MD  Additional Notes: Discussed risks and benefits of the nerve block in detail, including but not limited vascular injury, permanent nerve damage and infection.   Patient tolerated the procedure well. Local anesthetic introduced in an incremental fashion under minimal resistance after negative aspirations. No paresthesias were elicited. After completion of the procedure, no acute issues were identified and patient continued to be monitored by RN.

## 2022-09-01 NOTE — Transfer of Care (Signed)
Immediate Anesthesia Transfer of Care Note  Patient: Frank Coffey  Procedure(s) Performed: LEFT ARM ARTERIOVENOUS (AV) FISTULA (Left)  Patient Location: PACU  Anesthesia Type:Regional  Level of Consciousness: awake, alert , and oriented  Airway & Oxygen Therapy: Patient Spontanous Breathing and Patient connected to nasal cannula oxygen  Post-op Assessment: Report given to RN  Post vital signs: stable  Last Vitals:  Vitals Value Taken Time  BP    Temp    Pulse 165 09/01/22 0845  Resp    SpO2 93 % 09/01/22 0845  Vitals shown include unfiled device data.  Last Pain:  Vitals:   09/01/22 0725  TempSrc:   PainSc: 0-No pain         Complications: No notable events documented.

## 2022-09-01 NOTE — H&P (Signed)
HPI:   Frank Coffey is a 42 y.o. male history of end-stage renal disease currently on dialysis Tuesdays, Thursdays and Saturdays via tunneled dialysis catheter.  Unfortunately catheter is not working in the recent days he is scheduled for replacement today with interventional radiology.  He states that soon after his last access procedure he did not feel a thrill.  He is frustrated that nothing is working for him.   Patient is left-hand dominant   Primary nephrologist is Dr. Allena Katz.   He continues to work 2 jobs 1 at the [employer] center and another with [employer].          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)     IBS (irritable bowel syndrome)     Obesity     OSA (obstructive sleep apnea)     Pneumonia 04/2019    with Covid   Sleep apnea               Family History  Problem Relation Age of Onset   Hypertension Mother     Hyperlipidemia Mother     Heart disease Mother     Kidney disease Mother     Depression Mother     Stomach cancer Father     Healthy Sister          x2   Healthy Brother          x2   Hypertension Maternal Grandmother     Stroke Maternal Grandmother     Heart disease Maternal Grandmother     Hyperlipidemia Maternal Grandmother     Stroke Maternal Grandfather     Hypertension Maternal Grandfather     Heart disease Maternal  Grandfather     Hyperlipidemia Maternal Grandfather     Alzheimer's disease Maternal Grandfather     Cancer - Other Paternal Grandmother     Healthy Daughter          x1   Healthy Son          x2   Diabetes Neg Hx     Heart attack Neg Hx     Sudden death Neg Hx     Colon cancer Neg Hx     Esophageal cancer Neg Hx     Pancreatic cancer Neg Hx     Inflammatory bowel disease Neg Hx     Liver disease Neg Hx     Rectal cancer Neg Hx               Past Surgical History:  Procedure Laterality Date   AV FISTULA PLACEMENT Right 02/17/2022    Procedure: RIGHT ARTERIOVENOUS (AV) FISTULA CREATION;  Surgeon: Maeola Harman, MD;  Location:  [facility] OR;  Service: Vascular;  Laterality: Right;   AV FISTULA PLACEMENT Right 05/26/2022    Procedure: RIGHT ARM BRACHIOCEPHALIC ARTERIOVENOUS (AV) FISTULA CREATION;  Surgeon: Maeola Harman, MD;  Location: [facility] OR;  Service: Vascular;  Laterality: Right;   BIOPSY   02/09/2020    Procedure: BIOPSY;  Surgeon: Lemar Lofty., MD;  Location: [facility] ENDOSCOPY;  Service: Gastroenterology;;   CHOLECYSTECTOMY       COLONOSCOPY WITH PROPOFOL N/A 02/09/2020    Procedure: COLONOSCOPY WITH PROPOFOL;  Surgeon: Lemar Lofty., MD;  Location: [facility] ENDOSCOPY;  Service: Gastroenterology;  Laterality: N/A;   ESOPHAGOGASTRODUODENOSCOPY (EGD) WITH PROPOFOL N/A 02/09/2020    Procedure: ESOPHAGOGASTRODUODENOSCOPY (EGD) WITH PROPOFOL;  Surgeon: Meridee Score Netty Starring., MD;  Location: [facility] ENDOSCOPY;  Service: Gastroenterology;  Laterality: N/A;   EYE SURGERY   01/18/2011   IR FLUORO GUIDE CV LINE RIGHT   12/07/2021   IR US GUIDE VASC ACCESS RIGHT   12/07/2021   POLYPECTOMY   02/09/2020    Procedure: POLYPECTOMY;  Surgeon: Mansouraty, Netty Starring., MD;  Location: [facility] ENDOSCOPY;  Service: Gastroenterology;;   RETINAL DETACHMENT REPAIR W/ SCLERAL BUCKLE LE        Left      Short Social History:  Social History             Tobacco Use   Smoking status:  Never   Smokeless tobacco: Never  Substance Use Topics   Alcohol use: Not Currently      Comment: occ      No Known Allergies              Current Outpatient Medications  Medication Sig Dispense Refill   albuterol (VENTOLIN HFA) 108 (90 Base) MCG/ACT inhaler Inhale 1-2 puffs into the lungs every 6 (six) hours as needed for wheezing (cough). 8 g 2   allopurinol (ZYLOPRIM) 300 MG tablet Take 0.5 tablets (150 mg total) by mouth every dialysis (after dialysis). 15 tablet 0   carvedilol (COREG) 25 MG tablet TAKE 1 TABLET(25 MG) BY MOUTH TWICE DAILY WITH A MEAL 60 tablet 3   dorzolamide-timolol (COSOPT) 22.3-6.8 MG/ML ophthalmic solution Place 1 drop into the left eye 2 (two) times daily.       Doxercalciferol (HECTOROL IV) as directed. On dialysis days       HYDROcodone-acetaminophen (NORCO) 5-325 MG tablet Take 1 tablet by mouth every 6 (six) hours as needed for moderate pain. 10 tablet 0   ipratropium (ATROVENT) 0.03 % nasal spray Place 2 sprays into both nostrils every 12 (twelve) hours. 30 mL 12   isosorbide mononitrate (IMDUR) 60 MG 24 hr tablet Take 60 mg by mouth daily.       latanoprost (XALATAN) 0.005 % ophthalmic solution Place 1 drop into the left eye at bedtime.       latanoprost (XALATAN) 0.005 % ophthalmic solution Place 1 drop into the left eye at bedtime.       lidocaine-prilocaine (EMLA) cream Apply 1 Application topically once. Dialysis days       LINZESS 145 MCG CAPS capsule TAKE 1 CAPSULE(145 MCG) BY MOUTH DAILY BEFORE BREAKFAST. GIVEN** (Patient taking differently: Take 145 mcg by mouth daily as needed (Constipation).) 90 capsule 3   sevelamer carbonate (RENVELA) 800 MG tablet Take 800 mg by mouth 3 (three) times daily with meals.        No current facility-administered medications for this visit.      Review of Systems  Constitutional: Positive for unexpected weight change.  HENT:  HENT negative.  Eyes: Eyes negative.  Respiratory: Respiratory negative.   Cardiovascular: Cardiovascular negative.  GI: Gastrointestinal negative.  Musculoskeletal: Musculoskeletal negative.  Skin: Skin negative.  Neurological: Neurological negative. Hematologic: Hematologic/lymphatic negative.  Psychiatric: Psychiatric negative.      Vitals:   09/01/22 0544  BP: 139/82  Pulse: 87  Resp: 20  Temp: 98.7 F (37.1 C)  SpO2: 96%         Physical Exam Awake alert oriented Right upper extremity incisions well-healed no thrills appreciable        Assessment/Plan:    42 year old male status post 2 upper extremity fistulas on his nondominant right arm 1 at the wrist and 1 at the antecubital both of which failed shortly after creation. Plan for left arm avf vs avg. Will initiate 81mg  aspirin after surgery.   Daanya Lanphier C. Randie Heinz, MD [facility] [place] [facility] Office: [phone] Pager: [phone]

## 2022-09-01 NOTE — Anesthesia Postprocedure Evaluation (Signed)
Anesthesia Post Note  Patient: Frank Coffey  Procedure(s) Performed: LEFT ARM ARTERIOVENOUS (AV) FISTULA (Left)     Patient location during evaluation: PACU Anesthesia Type: Regional Level of consciousness: awake and alert Pain management: pain level controlled Vital Signs Assessment: post-procedure vital signs reviewed and stable Respiratory status: spontaneous breathing, nonlabored ventilation, respiratory function stable and patient connected to nasal cannula oxygen Cardiovascular status: stable and blood pressure returned to baseline Postop Assessment: no apparent nausea or vomiting Anesthetic complications: no  No notable events documented.  Last Vitals:  Vitals:   09/01/22 0930 09/01/22 0945  BP: 102/65 (!) 89/59  Pulse: 78 79  Resp: (!) 21 19  Temp:  (!) 36.2 C  SpO2: 92% 92%    Last Pain:  Vitals:   09/01/22 0945  TempSrc:   PainSc: 0-No pain                 Shelton Silvas

## 2022-09-01 NOTE — Discharge Instructions (Signed)

## 2022-09-02 ENCOUNTER — Encounter (HOSPITAL_COMMUNITY): Payer: Self-pay | Admitting: Vascular Surgery

## 2022-09-06 ENCOUNTER — Ambulatory Visit: Payer: Managed Care, Other (non HMO) | Admitting: Internal Medicine

## 2022-09-24 ENCOUNTER — Telehealth: Payer: Managed Care, Other (non HMO) | Admitting: Physician Assistant

## 2022-09-24 DIAGNOSIS — R051 Acute cough: Secondary | ICD-10-CM

## 2022-09-24 MED ORDER — PROMETHAZINE-DM 6.25-15 MG/5ML PO SYRP: 5.0000 mL | ORAL_SOLUTION | Freq: Four times a day (QID) | ORAL | 0 refills | Status: DC | PRN

## 2022-09-24 NOTE — Progress Notes (Signed)
E-Visit for Cough  We are sorry that you are not feeling well.  Here is how we plan to help!  Based on your presentation I believe you most likely have A cough due to a virus.  This is called viral bronchitis and is best treated by rest, plenty of fluids and control of the cough.  You may use Ibuprofen or Tylenol as directed to help your symptoms.     In addition you may use Promethazine DM cough syrup and Mucinex    From your responses in the eVisit questionnaire you describe inflammation in the upper respiratory tract which is causing a significant cough.  This is commonly called Bronchitis and has four common causes:   Allergies Viral Infections Acid Reflux Bacterial Infection Allergies, viruses and acid reflux are treated by controlling symptoms or eliminating the cause. An example might be a cough caused by taking certain blood pressure medications. You stop the cough by changing the medication. Another example might be a cough caused by acid reflux. Controlling the reflux helps control the cough.  USE OF BRONCHODILATOR ("RESCUE") INHALERS: There is a risk from using your bronchodilator too frequently.  The risk is that over-reliance on a medication which only relaxes the muscles surrounding the breathing tubes can reduce the effectiveness of medications prescribed to reduce swelling and congestion of the tubes themselves.  Although you feel brief relief from the bronchodilator inhaler, your asthma may actually be worsening with the tubes becoming more swollen and filled with mucus.  This can delay other crucial treatments, such as oral steroid medications. If you need to use a bronchodilator inhaler daily, several times per day, you should discuss this with your provider.  There are probably better treatments that could be used to keep your asthma under control.     HOME CARE Only take medications as instructed by your medical team. Complete the entire course of an antibiotic. Drink  plenty of fluids and get plenty of rest. Avoid close contacts especially the very young and the elderly Cover your mouth if you cough or cough into your sleeve. Always remember to wash your hands A steam or ultrasonic humidifier can help congestion.   GET HELP RIGHT AWAY IF: You develop worsening fever. You become short of breath You cough up blood. Your symptoms persist after you have completed your treatment plan MAKE SURE YOU  Understand these instructions. Will watch your condition. Will get help right away if you are not doing well or get worse.    Thank you for choosing an e-visit.  Your e-visit answers were reviewed by a board certified advanced clinical practitioner to complete your personal care plan. Depending upon the condition, your plan could have included both over the counter or prescription medications.  Please review your pharmacy choice. Make sure the pharmacy is open so you can pick up prescription now. If there is a problem, you may contact your provider through [employer] and have the prescription routed to another pharmacy.  Your safety is important to [place]. If you have drug allergies check your prescription carefully.   For the next 24 hours you can use MyChart to ask questions about today's visit, request a non-urgent call back, or ask for a work or school excuse. You will get an email in the next two days asking about your experience. I hope that your e-visit has been valuable and will speed your recovery.   I have spent 5 minutes in review of e-visit questionnaire, review and updating  patient chart, medical decision making and response to patient.   Tylene Fantasia Ward, PA-C

## 2022-10-11 ENCOUNTER — Other Ambulatory Visit: Payer: Self-pay | Admitting: *Deleted

## 2022-10-11 DIAGNOSIS — N186 End stage renal disease: Secondary | ICD-10-CM

## 2022-10-16 ENCOUNTER — Encounter: Payer: Self-pay | Admitting: Internal Medicine

## 2022-10-16 ENCOUNTER — Ambulatory Visit (HOSPITAL_COMMUNITY): Payer: Managed Care, Other (non HMO) | Attending: Internal Medicine

## 2022-10-16 DIAGNOSIS — I428 Other cardiomyopathies: Secondary | ICD-10-CM | POA: Diagnosis not present

## 2022-10-16 LAB — ECHOCARDIOGRAM COMPLETE
Area-P 1/2: 2.67 cm2
Est EF: 55
S' Lateral: 4 cm

## 2022-10-18 ENCOUNTER — Ambulatory Visit (HOSPITAL_COMMUNITY): Payer: Managed Care, Other (non HMO) | Attending: Vascular Surgery

## 2022-10-18 ENCOUNTER — Telehealth: Payer: Self-pay | Admitting: Vascular Surgery

## 2022-10-20 ENCOUNTER — Ambulatory Visit (HOSPITAL_COMMUNITY)
Admission: RE | Admit: 2022-10-20 | Discharge: 2022-10-20 | Disposition: A | Discharge status: Home / Self Care | Payer: Managed Care, Other (non HMO) | Source: Ambulatory Visit | Attending: Vascular Surgery | Admitting: Vascular Surgery

## 2022-10-20 DIAGNOSIS — N186 End stage renal disease: Secondary | ICD-10-CM | POA: Diagnosis present

## 2022-10-25 ENCOUNTER — Ambulatory Visit (INDEPENDENT_AMBULATORY_CARE_PROVIDER_SITE_OTHER): Payer: Managed Care, Other (non HMO) | Admitting: Physician Assistant

## 2022-10-25 ENCOUNTER — Encounter: Payer: Self-pay | Admitting: Physician Assistant

## 2022-10-25 VITALS — BP 100/60 | HR 99 | Temp 97.9°F | Resp 18 | Ht 68.0 in | Wt 345.6 lb

## 2022-10-25 DIAGNOSIS — N186 End stage renal disease: Secondary | ICD-10-CM

## 2022-10-25 NOTE — Progress Notes (Signed)
POST OPERATIVE OFFICE NOTE    CC:  F/u for surgery  HPI:  This is a 42 y.o. male who is s/p S/P left BC fistula creation 09/01/22 by Dr. Randie Heinz this is his third attempt for permanent access.  He has a functioning TDC that was placed over a year ago.    Pt returns today for follow up.  Pt denies loss of sensation, motor, and no pain in the left hand.     No Known Allergies  Current Outpatient Medications  Medication Sig Dispense Refill   albuterol (VENTOLIN HFA) 108 (90 Base) MCG/ACT inhaler Inhale 1-2 puffs into the lungs every 6 (six) hours as needed for wheezing (cough). 8 g 2   allopurinol (ZYLOPRIM) 300 MG tablet Take 0.5 tablets (150 mg total) by mouth every dialysis (after dialysis). 15 tablet 0   carvedilol (COREG) 25 MG tablet TAKE 1 TABLET(25 MG) BY MOUTH TWICE DAILY WITH A MEAL 60 tablet 3   colchicine 0.6 MG tablet Take 0.6 mg by mouth.     dorzolamide-timolol (COSOPT) 22.3-6.8 MG/ML ophthalmic solution Place 1 drop into the left eye 2 (two) times daily.     Doxercalciferol (HECTOROL IV) as directed. On dialysis days     doxycycline (VIBRA-TABS) 100 MG tablet Take 1 tablet (100 mg total) by mouth 2 (two) times daily. (Patient not taking: Reported on 08/31/2022) 14 tablet 0   HYDROcodone-acetaminophen (NORCO/VICODIN) 5-325 MG tablet Take 1 tablet by mouth every 6 (six) hours as needed for moderate pain. 12 tablet 0   ipratropium (ATROVENT) 0.03 % nasal spray Place 2 sprays into both nostrils every 12 (twelve) hours. (Patient taking differently: Place 2 sprays into both nostrils 2 (two) times daily as needed for rhinitis.) 30 mL 12   isosorbide mononitrate (IMDUR) 60 MG 24 hr tablet Take 60 mg by mouth daily.     latanoprost (XALATAN) 0.005 % ophthalmic solution Place 1 drop into the left eye at bedtime.     lidocaine-prilocaine (EMLA) cream Apply 1 Application topically once. Dialysis days     LINZESS 145 MCG CAPS capsule TAKE 1 CAPSULE(145 MCG) BY MOUTH DAILY BEFORE BREAKFAST.  GIVEN** (Patient not taking: Reported on 10/25/2022) 90 capsule 3   promethazine-dextromethorphan (PROMETHAZINE-DM) 6.25-15 MG/5ML syrup Take 5 mLs by mouth 4 (four) times daily as needed for cough. 118 mL 0   sevelamer carbonate (RENVELA) 800 MG tablet Take 800 mg by mouth 3 (three) times daily with meals.     XPHOZAH 30 MG TABS Take 30 mg by mouth 2 (two) times daily.     Current Facility-Administered Medications  Medication Dose Route Frequency Provider Last Rate Last Admin   0.9 %  sodium chloride infusion  250 mL Intravenous PRN Maeola Harman, MD       sodium chloride flush (NS) 0.9 % injection 3 mL  3 mL Intravenous Q12H Maeola Harman, MD         ROS:  See HPI  Physical Exam:    Incision:  left AC UE incision has healed well Extremities:  palpable radial pulse and thrill in the fistula. Neuro: sensation and motor intact B UE equal  Findings:  +--------------------+----------+-----------------+--------+  AVF                PSV (cm/s)Flow Vol (mL/min)Comments  +--------------------+----------+-----------------+--------+  Native artery inflow   381          2321                 +--------------------+----------+-----------------+--------+  AVF Anastomosis        449                               +--------------------+----------+-----------------+--------+     +------------+----------+-------------+----------+--------------+  OUTFLOW VEINPSV (cm/s)Diameter (cm)Depth (cm)   Describe     +------------+----------+-------------+----------+--------------+  Prox UA        149        0.87        2.19                   +------------+----------+-------------+----------+--------------+  Mid UA         131        0.73        1.90                   +------------+----------+-------------+----------+--------------+  Dist UA        200        0.93        0.87   0.63 cm branch   +------------+----------+-------------+----------+--------------+  AC Fossa       517        0.58        0.63                   +------------+----------+-------------+----------+--------------+     Summary:  Patent left BC AVF.  Flow volume is 2321 ml/min.  No areas of significant stenosis.  No thrombus seen.    Assessment/Plan:  This is a 42 y.o. male who is s/p:Left BC AV fistula creation By Dr. Randie Heinz on 09/01/22.  The diameter is excellent with good flow vol.  The fistula is too deep to access > 0.6-0.2.9 cm.  He will be scheduled for superficiaslization.  The goal is < 0.6 cm from the skin surface.  This will be scheduled in the near future.        Mosetta Pigeon PA-C [facility] [phone]   Clinic MD:  Randie Heinz

## 2022-10-30 ENCOUNTER — Other Ambulatory Visit: Payer: Self-pay

## 2022-10-30 ENCOUNTER — Emergency Department (HOSPITAL_BASED_OUTPATIENT_CLINIC_OR_DEPARTMENT_OTHER)
Admission: EM | Admit: 2022-10-30 | Discharge: 2022-10-30 | Disposition: A | Discharge status: Home / Self Care | Payer: Managed Care, Other (non HMO) | Attending: Emergency Medicine | Admitting: Emergency Medicine

## 2022-10-30 ENCOUNTER — Encounter (HOSPITAL_BASED_OUTPATIENT_CLINIC_OR_DEPARTMENT_OTHER): Payer: Self-pay | Admitting: Emergency Medicine

## 2022-10-30 ENCOUNTER — Emergency Department (HOSPITAL_BASED_OUTPATIENT_CLINIC_OR_DEPARTMENT_OTHER): Payer: Managed Care, Other (non HMO)

## 2022-10-30 DIAGNOSIS — R590 Localized enlarged lymph nodes: Secondary | ICD-10-CM | POA: Diagnosis not present

## 2022-10-30 DIAGNOSIS — R1030 Lower abdominal pain, unspecified: Secondary | ICD-10-CM | POA: Diagnosis present

## 2022-10-30 DIAGNOSIS — R1032 Left lower quadrant pain: Secondary | ICD-10-CM | POA: Insufficient documentation

## 2022-10-30 DIAGNOSIS — R591 Generalized enlarged lymph nodes: Secondary | ICD-10-CM

## 2022-10-30 LAB — URINALYSIS, ROUTINE W REFLEX MICROSCOPIC
Bilirubin Urine: NEGATIVE
Glucose, UA: 100 mg/dL — AB
Ketones, ur: NEGATIVE mg/dL
Leukocytes,Ua: NEGATIVE
Nitrite: NEGATIVE
Protein, ur: 100 mg/dL — AB
Specific Gravity, Urine: 1.013 (ref 1.005–1.030)
pH: 6.5 (ref 5.0–8.0)

## 2022-10-30 LAB — CBC
HCT: 35.7 % — ABNORMAL LOW (ref 39.0–52.0)
Hemoglobin: 11.4 g/dL — ABNORMAL LOW (ref 13.0–17.0)
MCH: 28.4 pg (ref 26.0–34.0)
MCHC: 31.9 g/dL (ref 30.0–36.0)
MCV: 89 fL (ref 80.0–100.0)
Platelets: 211 10*3/uL (ref 150–400)
RBC: 4.01 MIL/uL — ABNORMAL LOW (ref 4.22–5.81)
RDW: 15 % (ref 11.5–15.5)
WBC: 8.8 10*3/uL (ref 4.0–10.5)
nRBC: 0 % (ref 0.0–0.2)

## 2022-10-30 LAB — COMPREHENSIVE METABOLIC PANEL
ALT: 55 U/L — ABNORMAL HIGH (ref 0–44)
AST: 38 U/L (ref 15–41)
Albumin: 4.1 g/dL (ref 3.5–5.0)
Alkaline Phosphatase: 51 U/L (ref 38–126)
Anion gap: 16 — ABNORMAL HIGH (ref 5–15)
BUN: 75 mg/dL — ABNORMAL HIGH (ref 6–20)
CO2: 25 mmol/L (ref 22–32)
Calcium: 10.3 mg/dL (ref 8.9–10.3)
Chloride: 94 mmol/L — ABNORMAL LOW (ref 98–111)
Creatinine, Ser: 13.76 mg/dL — ABNORMAL HIGH (ref 0.61–1.24)
GFR, Estimated: 4 mL/min — ABNORMAL LOW (ref >60)
Glucose, Bld: 114 mg/dL — ABNORMAL HIGH (ref 70–99)
Potassium: 5 mmol/L (ref 3.5–5.1)
Sodium: 135 mmol/L (ref 135–145)
Total Bilirubin: 0.5 mg/dL (ref 0.3–1.2)
Total Protein: 8.3 g/dL — ABNORMAL HIGH (ref 6.5–8.1)

## 2022-10-30 LAB — LIPASE, BLOOD: Lipase: 49 U/L (ref 11–51)

## 2022-10-30 MED ORDER — IOHEXOL 300 MG/ML  SOLN
100.0000 mL | Freq: Once | INTRAMUSCULAR | Status: AC | PRN
  Administered 2022-10-30: 100 mL via INTRAVENOUS

## 2022-10-30 NOTE — Discharge Instructions (Signed)
The workup was reassuring.  There was some swollen lymph nodes in the lower abdomen and groin.  Follow-up with your primary care doctor and you may need follow-up imaging.

## 2022-10-30 NOTE — ED Notes (Signed)
Patient transported to CT via wheelchair with tech.

## 2022-10-30 NOTE — ED Notes (Signed)
Patient reports lower abdominal pain with radiation to groin since Thursday. Patient denies any current abdominal pain at rest but reports 4/10 pain upon palpation. Patient denies N/V.

## 2022-10-30 NOTE — ED Triage Notes (Signed)
Pt via pov from home with lower abdominal pain x 3 days. Dialysis pt T-TH-Sat. Pt reports that he only produces urine twice per day and states that he hasn't noticed any urinary symptoms. Pt endorses headache x 4 days. Pt alert & oriented, nad notes.

## 2022-10-30 NOTE — ED Provider Notes (Signed)
[place] EMERGENCY DEPARTMENT AT [facility] Provider Note   CSN: [id number] Arrival date & time: 10/30/22  1355     History  Chief Complaint  Patient presents with   Abdominal Pain    Frank Coffey is a 42 y.o. male.   Abdominal Pain Patient presents with abdominal pain.  Lower abdomen.  Has had around for 3 to 4 days.  Dull headache also.  Comes and goes.  States it felt better after taking some "binder that he had been taking.  He is a dialysis patient.  States it may have hurt a little more during dialysis on Saturday.  Today is Monday.  No fevers.  No dysuria.  Does make a small amount of urine and that is unchanged.  No dysuria.  No penile discharge.     Home Medications Prior to Admission medications   Medication Sig Start Date End Date Taking? Authorizing Provider  albuterol (VENTOLIN HFA) 108 (90 Base) MCG/ACT inhaler Inhale 1-2 puffs into the lungs every 6 (six) hours as needed for wheezing (cough). 06/09/21   Mliss Sax, MD  allopurinol (ZYLOPRIM) 300 MG tablet Take 0.5 tablets (150 mg total) by mouth every dialysis (after dialysis). 05/27/22   Claiborne Rigg, NP  carvedilol (COREG) 25 MG tablet TAKE 1 TABLET(25 MG) BY MOUTH TWICE DAILY WITH A MEAL 05/25/22   Chandrasekhar, Mahesh A, MD  colchicine 0.6 MG tablet Take 0.6 mg by mouth. 07/07/22   [provider]  dorzolamide-timolol (COSOPT) 22.3-6.8 MG/ML ophthalmic solution Place 1 drop into the left eye 2 (two) times daily. 03/10/19   [provider]  Doxercalciferol (HECTOROL IV) as directed. On dialysis days 03/04/22 03/03/23  [provider]  doxycycline (VIBRA-TABS) 100 MG tablet Take 1 tablet (100 mg total) by mouth 2 (two) times daily. Patient not taking: Reported on 08/31/2022 07/19/22   Loyola Mast, MD  HYDROcodone-acetaminophen (NORCO/VICODIN) 5-325 MG tablet Take 1 tablet by mouth every 6 (six) hours as needed for moderate pain. 09/01/22 09/01/23  Lars Mage,  PA-C  ipratropium (ATROVENT) 0.03 % nasal spray Place 2 sprays into both nostrils every 12 (twelve) hours. Patient taking differently: Place 2 sprays into both nostrils 2 (two) times daily as needed for rhinitis. 04/07/22   Viviano Simas, FNP  isosorbide mononitrate (IMDUR) 60 MG 24 hr tablet Take 60 mg by mouth daily.    [provider]  latanoprost (XALATAN) 0.005 % ophthalmic solution Place 1 drop into the left eye at bedtime. 04/14/19   [provider]  lidocaine-prilocaine (EMLA) cream Apply 1 Application topically once. Dialysis days 02/21/22   [provider]  LINZESS 145 MCG CAPS capsule TAKE 1 CAPSULE(145 MCG) BY MOUTH DAILY BEFORE BREAKFAST. GIVEN** Patient not taking: Reported on 10/25/2022 10/31/21   Nche, Bonna Gains, NP  promethazine-dextromethorphan (PROMETHAZINE-DM) 6.25-15 MG/5ML syrup Take 5 mLs by mouth 4 (four) times daily as needed for cough. 09/24/22   Ward, Tylene Fantasia, PA-C  sevelamer carbonate (RENVELA) 800 MG tablet Take 800 mg by mouth 3 (three) times daily with meals. 05/23/22   [provider]  XPHOZAH 30 MG TABS Take 30 mg by mouth 2 (two) times daily. 06/19/22   [provider]      Allergies    Patient has no known allergies.    Review of Systems   Review of Systems  Gastrointestinal:  Positive for abdominal pain.    Physical Exam Updated Vital Signs BP 110/61   Pulse 84  Temp 98.4 F (36.9 C) (Oral)   Resp 18   Ht 5\' 8"  (1.727 m)   Wt (!) 150.6 kg   SpO2 96%   BMI 50.48 kg/m  Physical Exam Cardiovascular:     Rate and Rhythm: Normal rate.  Abdominal:     Tenderness: There is abdominal tenderness.  Genitourinary:    Comments: No testicular tenderness Skin:    General: Skin is warm.  Neurological:     Mental Status: He is alert.     ED Results / Procedures / Treatments   Labs (all labs ordered are listed, but only abnormal results are displayed) Labs Reviewed  COMPREHENSIVE METABOLIC PANEL - Abnormal;  Notable for the following components:      Result Value   Chloride 94 (*)    Glucose, Bld 114 (*)    BUN 75 (*)    Creatinine, Ser 13.76 (*)    Total Protein 8.3 (*)    ALT 55 (*)    GFR, Estimated 4 (*)    Anion gap 16 (*)    All other components within normal limits  CBC - Abnormal; Notable for the following components:   RBC 4.01 (*)    Hemoglobin 11.4 (*)    HCT 35.7 (*)    All other components within normal limits  URINALYSIS, ROUTINE W REFLEX MICROSCOPIC - Abnormal; Notable for the following components:   Glucose, UA 100 (*)    Hgb urine dipstick MODERATE (*)    Protein, ur 100 (*)    Bacteria, UA RARE (*)    All other components within normal limits  LIPASE, BLOOD    EKG None  Radiology CT ABDOMEN PELVIS W CONTRAST  Result Date: 10/30/2022 CLINICAL DATA:  LEFT lower quadrant abdominal pain. Pain for 3 days. Dialysis patient. EXAM: CT ABDOMEN AND PELVIS WITH CONTRAST TECHNIQUE: Multidetector CT imaging of the abdomen and pelvis was performed using the standard protocol following bolus administration of intravenous contrast. RADIATION DOSE REDUCTION: This exam was performed according to the departmental dose-optimization program which includes automated exposure control, adjustment of the mA and/or kV according to patient size and/or use of iterative reconstruction technique. CONTRAST:  OMNIPAQUE IOHEXOL 300 MG/ML  SOLN COMPARISON:  None Available. FINDINGS: Lower chest: Lung bases are clear. Hepatobiliary: No focal hepatic lesion. Postcholecystectomy. No biliary dilatation. Pancreas: Pancreas is normal. No ductal dilatation. No pancreatic inflammation. Spleen: Normal spleen Adrenals/urinary tract: Adrenal glands normal. Kidneys are small. Simple fluid attenuation exophytic rounded cystic lesion from the posterior RIGHT kidney measures 15 mm. Ureters normal. Bladder normal. Stomach/Bowel: Stomach, small bowel, appendix, and cecum are normal. The colon and rectosigmoid colon  are normal. Vascular/Lymphatic: Abdominal aorta normal caliber. There are prominent enlarged common iliac and external iliac lymph nodes. Example RIGHT common iliac lymph node measuring 2.1 cm (58/2) RIGHT external iliac lymph node anterior to the operator space measuring 16 mm image 76/2. Similar LEFT operator node measuring 14 mm image 74. No inguinal adenopathy. Reproductive: Prostate unremarkable. Other: No free fluid. Musculoskeletal: No aggressive osseous lesion. IMPRESSION: 1. No acute findings in the abdomen pelvis. 2. Normal appendix. 3. No bowel inflammation. 4. Small kidneys consistent with chronic renal failure. 5. Prominent enlarged common iliac and external iliac lymph nodes. Nonspecific finding. Differential would include lymphoproliferative disorder versus chronic non malignant adenopathy. Recommend clinical correlation and consider follow-up CT or PET-CT in 3 months. Electronically Signed   By: Genevive Bi M.D.   On: 10/30/2022 18:41    Procedures Procedures  Medications Ordered in ED Medications  iohexol (OMNIPAQUE) 300 MG/ML solution 100 mL (100 mLs Intravenous Contrast Given 10/30/22 1608)    ED Course/ Medical Decision Making/ A&P                                 Medical Decision Making Amount and/or Complexity of Data Reviewed Labs: ordered. Radiology: ordered.  Risk Prescription drug management.   Patient with abdominal pain.  Suprapubic and left lower quadrant.  White count reassuring.  Differential diagnosis includes nonspecific abdominal pain, UTI, colitis.  Will get CT scan to further evaluate.  Patient with CT scan that showed lymphadenopathy but no other clear cause of pain.  Somewhat difficult to evaluate due to body habitus but do not clearly palpate the swelling although patient states it is there.  Could be lymphadenopathy was reactive or potentially malignant.  Discussed with patient and will follow with PCP.  Radiology recommendation was follow-up  in 3 to 6 months with repeat imaging versus PET scan.       Final Clinical Impression(s) / ED Diagnoses Final diagnoses:  Lower abdominal pain  Lymphadenopathy    Rx / DC Orders ED Discharge Orders     None         Benjiman Core, MD 10/30/22 1910

## 2022-10-31 ENCOUNTER — Other Ambulatory Visit: Payer: Self-pay | Admitting: *Deleted

## 2022-10-31 ENCOUNTER — Encounter: Payer: Self-pay | Admitting: *Deleted

## 2022-10-31 DIAGNOSIS — N186 End stage renal disease: Secondary | ICD-10-CM

## 2022-11-06 ENCOUNTER — Telehealth: Payer: Managed Care, Other (non HMO) | Admitting: Physician Assistant

## 2022-11-06 DIAGNOSIS — J208 Acute bronchitis due to other specified organisms: Secondary | ICD-10-CM

## 2022-11-06 DIAGNOSIS — N186 End stage renal disease: Secondary | ICD-10-CM | POA: Diagnosis not present

## 2022-11-06 DIAGNOSIS — Z992 Dependence on renal dialysis: Secondary | ICD-10-CM

## 2022-11-06 DIAGNOSIS — B9689 Other specified bacterial agents as the cause of diseases classified elsewhere: Secondary | ICD-10-CM

## 2022-11-06 DIAGNOSIS — R051 Acute cough: Secondary | ICD-10-CM

## 2022-11-06 MED ORDER — AZITHROMYCIN 250 MG PO TABS
ORAL_TABLET | ORAL | 0 refills | Status: AC
Start: 2022-11-06 — End: 2022-11-11

## 2022-11-06 NOTE — Progress Notes (Signed)
E-Visit for Cough  We are sorry that you are not feeling well.  Here is how we plan to help!  Based on your presentation I believe you most likely have A cough due to bacteria.  When patients have a fever and a productive cough with a change in color or increased sputum production, we are concerned about bacterial bronchitis.  If left untreated it can progress to pneumonia.  If your symptoms do not improve with your treatment plan it is important that you contact your provider.   I have prescribed Azithromyin 250 mg: two tablets now and then one tablet daily for 4 additonal days    In addition you may use A non-prescription cough medication called Mucinex DM: take 2 tablets every 12 hours.  From your responses in the eVisit questionnaire you describe inflammation in the upper respiratory tract which is causing a significant cough.  This is commonly called Bronchitis and has four common causes:   Allergies Viral Infections Acid Reflux Bacterial Infection Allergies, viruses and acid reflux are treated by controlling symptoms or eliminating the cause. An example might be a cough caused by taking certain blood pressure medications. You stop the cough by changing the medication. Another example might be a cough caused by acid reflux. Controlling the reflux helps control the cough.  USE OF BRONCHODILATOR ("RESCUE") INHALERS: There is a risk from using your bronchodilator too frequently.  The risk is that over-reliance on a medication which only relaxes the muscles surrounding the breathing tubes can reduce the effectiveness of medications prescribed to reduce swelling and congestion of the tubes themselves.  Although you feel brief relief from the bronchodilator inhaler, your asthma may actually be worsening with the tubes becoming more swollen and filled with mucus.  This can delay other crucial treatments, such as oral steroid medications. If you need to use a bronchodilator inhaler daily, several times  per day, you should discuss this with your provider.  There are probably better treatments that could be used to keep your asthma under control.     HOME CARE Only take medications as instructed by your medical team. Complete the entire course of an antibiotic. Drink plenty of fluids and get plenty of rest. Avoid close contacts especially the very young and the elderly Cover your mouth if you cough or cough into your sleeve. Always remember to wash your hands A steam or ultrasonic humidifier can help congestion.   GET HELP RIGHT AWAY IF: You develop worsening fever. You become short of breath You cough up blood. Your symptoms persist after you have completed your treatment plan MAKE SURE YOU  Understand these instructions. Will watch your condition. Will get help right away if you are not doing well or get worse.    Thank you for choosing an e-visit.  Your e-visit answers were reviewed by a board certified advanced clinical practitioner to complete your personal care plan. Depending upon the condition, your plan could have included both over the counter or prescription medications.  Please review your pharmacy choice. Make sure the pharmacy is open so you can pick up prescription now. If there is a problem, you may contact your provider through [employer] and have the prescription routed to another pharmacy.  Your safety is important to [place]. If you have drug allergies check your prescription carefully.   For the next 24 hours you can use MyChart to ask questions about today's visit, request a non-urgent call back, or ask for a work or school  excuse. You will get an email in the next two days asking about your experience. I hope that your e-visit has been valuable and will speed your recovery.  I have spent 5 minutes in review of e-visit questionnaire, review and updating patient chart, medical decision making and response to patient.   Twilia Yaklin M Kosei Rhodes, PA-C  

## 2022-11-09 ENCOUNTER — Telehealth: Payer: Self-pay

## 2022-11-09 NOTE — Telephone Encounter (Signed)
Received a notification from Antionette Poles, PA-C that patient was recently diagnosed with bacterial bronchitis on 10/21 by his PCP and started on antibiotics. Will need to postpone fistula surgery until he is at least 2 weeks symptom free.   Informed patient of the above information. He verbalized understanding and agreed to reschedule on 12/6. Instructions reviewed and advised to contact office if his symptoms has not improved, so we could reschedule surgery if needed. He voiced understanding.

## 2022-11-13 ENCOUNTER — Telehealth: Payer: Managed Care, Other (non HMO) | Admitting: Physician Assistant

## 2022-11-13 DIAGNOSIS — R051 Acute cough: Secondary | ICD-10-CM

## 2022-11-13 MED ORDER — BENZONATATE 100 MG PO CAPS: 100.0000 mg | ORAL_CAPSULE | Freq: Three times a day (TID) | ORAL | 0 refills | Status: DC | PRN

## 2022-11-13 MED ORDER — PROMETHAZINE-DM 6.25-15 MG/5ML PO SYRP
5.0000 mL | ORAL_SOLUTION | Freq: Four times a day (QID) | ORAL | 0 refills | Status: DC | PRN
Start: 2022-11-13 — End: 2023-04-18

## 2022-11-13 NOTE — Progress Notes (Signed)
E-Visit for Cough   We are sorry that you are not feeling well.  Here is how we plan to help!  Based on your presentation I believe you most likely have A cough due to a virus.  This is called viral bronchitis and is best treated by rest, plenty of fluids and control of the cough.  You may use Ibuprofen or Tylenol as directed to help your symptoms.     In addition you may use A prescription cough medication called Tessalon Perles 100mg . You may take 1-2 capsules every 8 hours as needed for your cough.  I have also sent in a cough syrup, Promethazine DM, to try.    A cough can linger for several weeks after upper respiratory infection.  Make sure you are drinking plenty of fluids, continue with Mucinex if you are experiencing congestion or drainage.   From your responses in the eVisit questionnaire you describe inflammation in the upper respiratory tract which is causing a significant cough.  This is commonly called Bronchitis and has four common causes:   Allergies Viral Infections Acid Reflux Bacterial Infection Allergies, viruses and acid reflux are treated by controlling symptoms or eliminating the cause. An example might be a cough caused by taking certain blood pressure medications. You stop the cough by changing the medication. Another example might be a cough caused by acid reflux. Controlling the reflux helps control the cough.  USE OF BRONCHODILATOR ("RESCUE") INHALERS: There is a risk from using your bronchodilator too frequently.  The risk is that over-reliance on a medication which only relaxes the muscles surrounding the breathing tubes can reduce the effectiveness of medications prescribed to reduce swelling and congestion of the tubes themselves.  Although you feel brief relief from the bronchodilator inhaler, your asthma may actually be worsening with the tubes becoming more swollen and filled with mucus.  This can delay other crucial treatments, such as oral steroid medications.  If you need to use a bronchodilator inhaler daily, several times per day, you should discuss this with your provider.  There are probably better treatments that could be used to keep your asthma under control.     HOME CARE Only take medications as instructed by your medical team. Complete the entire course of an antibiotic. Drink plenty of fluids and get plenty of rest. Avoid close contacts especially the very young and the elderly Cover your mouth if you cough or cough into your sleeve. Always remember to wash your hands A steam or ultrasonic humidifier can help congestion.   GET HELP RIGHT AWAY IF: You develop worsening fever. You become short of breath You cough up blood. Your symptoms persist after you have completed your treatment plan MAKE SURE YOU  Understand these instructions. Will watch your condition. Will get help right away if you are not doing well or get worse.    Thank you for choosing an e-visit.  Your e-visit answers were reviewed by a board certified advanced clinical practitioner to complete your personal care plan. Depending upon the condition, your plan could have included both over the counter or prescription medications.  Please review your pharmacy choice. Make sure the pharmacy is open so you can pick up prescription now. If there is a problem, you may contact your provider through [employer] and have the prescription routed to another pharmacy.  Your safety is important to [place]. If you have drug allergies check your prescription carefully.   For the next 24 hours you can use MyChart  to ask questions about today's visit, request a non-urgent call back, or ask for a work or school excuse. You will get an email in the next two days asking about your experience. I hope that your e-visit has been valuable and will speed your recovery.  Christeen Douglas, PA-C

## 2022-11-26 ENCOUNTER — Other Ambulatory Visit: Payer: Self-pay | Admitting: Nurse Practitioner

## 2022-11-26 ENCOUNTER — Telehealth: Payer: Managed Care, Other (non HMO) | Admitting: Nurse Practitioner

## 2022-11-26 DIAGNOSIS — J208 Acute bronchitis due to other specified organisms: Secondary | ICD-10-CM | POA: Diagnosis not present

## 2022-11-26 DIAGNOSIS — B9689 Other specified bacterial agents as the cause of diseases classified elsewhere: Secondary | ICD-10-CM | POA: Diagnosis not present

## 2022-11-26 MED ORDER — AZITHROMYCIN 250 MG PO TABS: ORAL_TABLET | ORAL | 0 refills | Status: AC

## 2022-11-26 MED ORDER — PREDNISONE 10 MG PO TABS
ORAL_TABLET | ORAL | 0 refills | Status: DC
Start: 2022-11-26 — End: 2022-11-26

## 2022-11-26 MED ORDER — AZITHROMYCIN 250 MG PO TABS
ORAL_TABLET | ORAL | 0 refills | Status: DC
Start: 2022-11-26 — End: 2022-11-26

## 2022-11-26 MED ORDER — PREDNISONE 10 MG PO TABS: ORAL_TABLET | ORAL | 0 refills | Status: DC

## 2022-11-26 NOTE — Progress Notes (Signed)
E-Visit for Cough   We are sorry that you are not feeling well.  Here is how we plan to help!  Based on your presentation I believe you most likely have A cough due to bacteria.  When patients have a fever and a productive cough with a change in color or increased sputum production, we are concerned about bacterial bronchitis.  If left untreated it can progress to pneumonia.  If your symptoms do not improve with your treatment plan it is important that you contact your provider.   I have prescribed Azithromyin 250 mg: two tablets now and then one tablet daily for 4 additonal days     Prednisone 10 mg daily for 6 days (see taper instructions below)  Directions for 6 day taper: Day 1: 2 tablets before breakfast, 1 after both lunch & dinner and 2 at bedtime Day 2: 1 tab before breakfast, 1 after both lunch & dinner and 2 at bedtime Day 3: 1 tab at each meal & 1 at bedtime Day 4: 1 tab at breakfast, 1 at lunch, 1 at bedtime Day 5: 1 tab at breakfast & 1 tab at bedtime Day 6: 1 tab at breakfast  From your responses in the eVisit questionnaire you describe inflammation in the upper respiratory tract which is causing a significant cough.  This is commonly called Bronchitis and has four common causes:   Allergies Viral Infections Acid Reflux Bacterial Infection Allergies, viruses and acid reflux are treated by controlling symptoms or eliminating the cause. An example might be a cough caused by taking certain blood pressure medications. You stop the cough by changing the medication. Another example might be a cough caused by acid reflux. Controlling the reflux helps control the cough.  USE OF BRONCHODILATOR ("RESCUE") INHALERS: There is a risk from using your bronchodilator too frequently.  The risk is that over-reliance on a medication which only relaxes the muscles surrounding the breathing tubes can reduce the effectiveness of medications prescribed to reduce swelling and congestion of the tubes  themselves.  Although you feel brief relief from the bronchodilator inhaler, your asthma may actually be worsening with the tubes becoming more swollen and filled with mucus.  This can delay other crucial treatments, such as oral steroid medications. If you need to use a bronchodilator inhaler daily, several times per day, you should discuss this with your provider.  There are probably better treatments that could be used to keep your asthma under control.     HOME CARE Only take medications as instructed by your medical team. Complete the entire course of an antibiotic. Drink plenty of fluids and get plenty of rest. Avoid close contacts especially the very young and the elderly Cover your mouth if you cough or cough into your sleeve. Always remember to wash your hands A steam or ultrasonic humidifier can help congestion.   GET HELP RIGHT AWAY IF: You develop worsening fever. You become short of breath You cough up blood. Your symptoms persist after you have completed your treatment plan MAKE SURE YOU  Understand these instructions. Will watch your condition. Will get help right away if you are not doing well or get worse.    Thank you for choosing an e-visit.  Your e-visit answers were reviewed by a board certified advanced clinical practitioner to complete your personal care plan. Depending upon the condition, your plan could have included both over the counter or prescription medications.  Please review your pharmacy choice. Make sure the pharmacy is open  so you can pick up prescription now. If there is a problem, you may contact your provider through [employer] and have the prescription routed to another pharmacy.  Your safety is important to [place]. If you have drug allergies check your prescription carefully.   For the next 24 hours you can use MyChart to ask questions about today's visit, request a non-urgent call back, or ask for a work or school excuse. You will get an email  in the next two days asking about your experience. I hope that your e-visit has been valuable and will speed your recovery.

## 2022-11-26 NOTE — Progress Notes (Signed)
I have spent 5 minutes in review of e-visit questionnaire, review and updating patient chart, medical decision making and response to patient.  ° °Jerrell Mangel W Secilia Apps, NP ° °  °

## 2022-12-21 ENCOUNTER — Encounter (HOSPITAL_COMMUNITY): Payer: Self-pay | Admitting: Vascular Surgery

## 2022-12-21 ENCOUNTER — Other Ambulatory Visit: Payer: Self-pay

## 2022-12-21 NOTE — Progress Notes (Signed)
Anesthesia Chart Review: Frank Coffey  Case: [id number] Date/Time: 12/22/22 0715   Procedure: LEFT ARM FISTULA SUPERFICIALIZATION (Left)   Anesthesia type: Choice   Pre-op diagnosis: ESRD   [facility]: [facility] ROOM 11 / [facility]   Surgeons: Maeola Harman, MD       DISCUSSION: Patient is a 42 year old male scheduled for the above procedure. He is s/p multiple AVF creations since February 2024 that have occluded early. As of October, he was using a right internal jugular TDC for HD. New AVF had been planned last month, but delayed to allow time for him to recover from bronchitis. Stated he has been symptom free since 12/01/22.   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   Last cardiology visit was on 03/08/22 with Dr. Izora Ribas.  He has a history of dilated cardiomyopathy dating back to ~ 2005. 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 He got re-established with Dr. Chilton Si [facility] in 11/2014 for volume overload and noted to have recurrent LV dysfunction with EF 30% with diffuse hypokinesis. He had underlying CKD and was treated medically and had intermittent follow-up through Fall 2019. He got re-established with Dr. Izora Ribas in January 2022. LVEF 35-40% by 11/08/21 echo. 04/28/22 Nuclear stress test was non-ischemic. Volume managed by hemodialysis. Coreg decreased due to hypotension. Follow-up in 6-12 months planned. Echo repeated on 10/16/22 and showed improved EF to 55%. No new changes recommended.     He is for labs and anesthesia team evaluation on the day of surgery.   VS:  Wt Readings from Last 3 Encounters:  10/30/22  (!) 150.6 kg  10/25/22 (!) 156.8 kg  09/01/22 (!) 149.7 kg   BP Readings from Last 3 Encounters:  10/30/22 115/73  10/25/22 100/60  09/01/22 (!) 89/59   Pulse Readings from Last 3 Encounters:  10/30/22 79  10/25/22 99  09/01/22 79    PROVIDERS: Nche, Bonna Gains, NP is PCP  Riley Lam, MD is cardiologist Zetta Bills, MD is nephrologist    LABS: For day of surgery. As of 10/30/22, H/H 11.4/35.7, glucose 94, AST 38, ALT 55. A1c 5.9% 07/12/21.    IMAGES: CXR 08/05/22: IMPRESSION: 1. Low volume film with vascular congestion and probable atelectasis in the bases, left greater than right. 2. Right IJ dialysis catheter tip overlies the SVC/RA junction.   EKG: 12/08/2021:  Sinus bradycardia at 52 bpm 1st degree A-V block Incomplete left bundle branch block T wave abnormality, consider lateral ischemia Confirmed by Steffanie Dunn [phone]) on 12/09/2021 9:15:22 PM     CV: Echo 10/16/22: IMPRESSIONS   1. Left ventricular ejection fraction, by estimation, is 55%. The left  ventricle has normal function. The left ventricle has no regional wall  motion abnormalities. There is mild concentric left ventricular  hypertrophy. Left ventricular diastolic  parameters are consistent with Grade I diastolic dysfunction (impaired  relaxation).   2. Right ventricular systolic function is normal. The right ventricular  size is normal. Tricuspid regurgitation signal is inadequate for assessing  PA pressure.   3. Left atrial size was moderately dilated.   4. The mitral valve is normal in structure. Mild mitral valve  regurgitation.  No evidence of mitral stenosis.   5. The aortic valve is tricuspid. Aortic valve regurgitation is not  visualized. No aortic stenosis is present.   6. Aortic dilatation noted. There is mild dilatation of the aortic root,  measuring 38 mm.   7. The inferior vena cava is dilated in size with >50% respiratory  variability, suggesting right atrial  pressure of 8 mmHg.  - Comparison(s): 11/08/21 EF 35-40%, LV global hypokinesis, grade 1 DD, normal RVSF, ascending aorta 43 mm; 02/18/20 EF 35%.    Nuclear stress test 04/28/2022:   Findings are consistent with no ischemia. The study is intermediate risk.   No ST deviation was noted.   LV perfusion is normal.   Left ventricular function is abnormal. Global function is moderately reduced. End diastolic cavity size is normal.   Prior study not available for comparison.   Intermediate risk study due to moderate global reduction in left ventricular systolic function.  Homogeneous perfusion pattern. No ischemia is seen. Consider nonischemic cardiomyopathy.    Cardiac event monitor 11/28/2017 - 12/17/2017: Quality: Fair.  Baseline artifact. Predominant rhythm: sinus rhythm Average heart rate: 81 bpm Max heart rate: 114 bpm Min heart rate: 49 bpm One episode of second degree heart block, Mobitz type I No other arrhythmias noted.     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)    IBS (irritable bowel syndrome)    Obesity    OSA (obstructive sleep apnea)    Pneumonia 04/2019   with Covid   Sleep apnea     Past Surgical History:  Procedure Laterality Date   AV FISTULA PLACEMENT Right 02/17/2022   Procedure: RIGHT ARTERIOVENOUS (AV) FISTULA CREATION;  Surgeon: Maeola Harman, MD;   Location: [facility] OR;  Service: Vascular;  Laterality: Right;   AV FISTULA PLACEMENT Right 05/26/2022   Procedure: RIGHT ARM BRACHIOCEPHALIC ARTERIOVENOUS (AV) FISTULA CREATION;  Surgeon: Maeola Harman, MD;  Location: [facility] OR;  Service: Vascular;  Laterality: Right;   AV FISTULA PLACEMENT Left 09/01/2022   Procedure: LEFT ARM ARTERIOVENOUS (AV) FISTULA;  Surgeon: Maeola Harman, MD;  Location: [facility] OR;  Service: Vascular;  Laterality: Left;   BIOPSY  02/09/2020   Procedure: BIOPSY;  Surgeon: Lemar Lofty., MD;  Location: [facility] ENDOSCOPY;  Service: Gastroenterology;;   CHOLECYSTECTOMY     COLONOSCOPY WITH PROPOFOL N/A 02/09/2020   Procedure: COLONOSCOPY WITH PROPOFOL;  Surgeon: Lemar Lofty., MD;  Location: [facility] ENDOSCOPY;  Service: Gastroenterology;  Laterality: N/A;   ESOPHAGOGASTRODUODENOSCOPY (EGD) WITH PROPOFOL N/A 02/09/2020   Procedure: ESOPHAGOGASTRODUODENOSCOPY (EGD) WITH PROPOFOL;  Surgeon: Meridee Score Netty Starring., MD;  Location: [facility] ENDOSCOPY;  Service: Gastroenterology;  Laterality: N/A;   EYE SURGERY  01/18/2011   IR FLUORO GUIDE CV LINE RIGHT  12/07/2021   IR FLUORO GUIDE CV LINE  RIGHT  06/07/2022   IR PTA VENOUS EXCEPT DIALYSIS CIRCUIT  06/07/2022   IR US GUIDE VASC ACCESS RIGHT  12/07/2021   POLYPECTOMY  02/09/2020   Procedure: POLYPECTOMY;  Surgeon: Mansouraty, Netty Starring., MD;  Location: [facility] ENDOSCOPY;  Service: Gastroenterology;;   RETINAL DETACHMENT REPAIR W/ SCLERAL BUCKLE LE     Left   UPPER EXTREMITY VENOGRAPHY Left 07/17/2022   Procedure: UPPER EXTREMITY VENOGRAPHY;  Surgeon: Maeola Harman, MD;  Location: [facility] INVASIVE CV LAB;  Service: Cardiovascular;  Laterality: Left;    MEDICATIONS:  0.9 %  sodium chloride infusion   sodium chloride flush (NS) 0.9 % injection 3 mL    albuterol (VENTOLIN HFA) 108 (90 Base) MCG/ACT inhaler   allopurinol (ZYLOPRIM) 300 MG tablet   carvedilol (COREG) 25 MG tablet   colchicine 0.6 MG tablet    dorzolamide-timolol (COSOPT) 22.3-6.8 MG/ML ophthalmic solution   ipratropium (ATROVENT) 0.03 % nasal spray   isosorbide mononitrate (IMDUR) 60 MG 24 hr tablet   latanoprost (XALATAN) 0.005 % ophthalmic solution   lidocaine-prilocaine (EMLA) cream   sevelamer carbonate (RENVELA) 800 MG tablet   XPHOZAH 30 MG TABS   benzonatate (TESSALON) 100 MG capsule   Doxercalciferol (HECTOROL IV)   LINZESS 145 MCG CAPS capsule   predniSONE (DELTASONE) 10 MG tablet   promethazine-dextromethorphan (PROMETHAZINE-DM) 6.25-15 MG/5ML syrup    Shonna Chock, PA-C Surgical Short Stay/Anesthesiology [facility] Phone [phone] [facility] Phone [phone] 12/21/2022 10:36 AM

## 2022-12-21 NOTE — Anesthesia Preprocedure Evaluation (Addendum)
Anesthesia Evaluation  Patient identified by MRN, date of birth, ID band Patient awake  General Assessment Comment:DISCUSSION: Patient is a 42 year old male scheduled for the above procedure. He is s/p multiple AVF creations since February 2024 that have occluded early. As of October, he was using a right internal jugular TDC for HD. New AVF had been planned last month, but delayed to allow time for him to recover from bronchitis. Stated he has been symptom free since 12/01/22.    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   Last cardiology visit was on 03/08/22 with Dr. Izora Ribas.  He has a history of dilated cardiomyopathy dating back to ~ 2005. 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 He got re-established with Dr. Chilton Si [facility] in 11/2014 for volume overload and noted to have recurrent LV dysfunction with EF 30% with diffuse hypokinesis. He had underlying CKD and was treated medically and had intermittent follow-up through Fall 2019. He got re-established with Dr. Izora Ribas in January 2022. LVEF 35-40% by 11/08/21 echo. 04/28/22 Nuclear stress test was non-ischemic. Volume managed by hemodialysis. Coreg decreased due to hypotension. Follow-up in 6-12 months planned. Echo repeated on 10/16/22 and showed improved EF to 55%. No new changes recommended.    Reviewed: Allergy & Precautions, H&P , NPO status , Patient's Chart, lab work & pertinent test results  Airway Mallampati: III  TM Distance: <3 FB Neck ROM: Full    Dental no notable dental hx.    Pulmonary sleep apnea and Continuous  Positive Airway Pressure Ventilation    Pulmonary exam normal breath sounds clear to auscultation       Cardiovascular hypertension, Pt. on medications +CHF  Normal cardiovascular exam Rhythm:Regular Rate:Normal     Neuro/Psych negative neurological ROS  negative psych ROS   GI/Hepatic Neg liver ROS,GERD  Medicated,,  Endo/Other    Class 4 obesity  Renal/GU negative Renal ROS  negative genitourinary   Musculoskeletal negative musculoskeletal ROS (+)    Abdominal   Peds negative pediatric ROS (+)  Hematology negative hematology ROS (+)   Anesthesia Other Findings   Reproductive/Obstetrics negative OB ROS                             Anesthesia Physical Anesthesia Plan  ASA: 4  Anesthesia Plan: MAC   Post-op Pain Management: Regional block*   Induction: Intravenous  PONV Risk Score and Plan: 1 and Propofol infusion and Treatment may vary due to age or medical condition  Airway Management Planned: Simple Face Mask  Additional Equipment:   Intra-op Plan:   Post-operative Plan:   Informed Consent: I have reviewed the patients History and Physical, chart, labs and discussed the procedure including the risks, benefits and alternatives for the proposed anesthesia with the patient or authorized representative who has indicated his/her understanding and acceptance.     Dental advisory given  Plan Discussed with: CRNA and Surgeon  Anesthesia Plan Comments: (PAT note written 12/21/2022 by Shonna Chock, PA-C.  )       Anesthesia Quick Evaluation

## 2022-12-21 NOTE — Progress Notes (Signed)
SDW CALL  Patient was given pre-op instructions over the phone. The opportunity was given for the patient to ask questions. No further questions asked. Patient verbalized understanding of instructions given.   PCP - Ceasar Lund Cardiologist - Mahesh Chandrasekhar,MD  PPM/ICD - denies Device Orders -  Rep Notified -   Chest x-ray - 08/05/22 EKG - DOS Stress Test - 04/28/22 ECHO - 10/16/22 Cardiac Cath - denies  Sleep Study - 12/13/20 CPAP - He says he uses it infrequently.   Fasting Blood Sugar - na Checks Blood Sugar _____ times a day  Blood Thinner Instructions:na Aspirin Instructions:na  ERAS Protcol -no PRE-SURGERY Ensure or G2-   COVID TEST- na   Anesthesia review: yes- history of  CHF,OSA. Pt treated for bacterial bronchitis in November.Prescribed Zithromax for 4 days on 11/26/22. States he has been symptom free since 12/01/22.  Patient denies shortness of breath, fever, cough and chest pain over the phone call    Surgical Instructions    Your procedure is scheduled on December 6  Report to [facility] Main Entrance "A" at 0530 A.M., then check in with the Admitting office.  Call this number if you have problems the morning of surgery:  [phone]    Remember:  Do not eat or drink anything after midnight the night before your surgery   Take these medicines the morning of surgery with A SIP OF WATER: Coreg,Colchicine,Cosopt eye gtts, Imdur. PRN- Atrovent nasal spray,Albuterol inhaler. Bring inhaler to hospital.   As of today, STOP taking any Aspirin (unless otherwise instructed by your surgeon) Aleve, Naproxen, Ibuprofen, Motrin, Advil, Goody's, BC's, all herbal medications, fish oil, and all vitamins.   is not responsible for any belongings or valuables. .   Do NOT Smoke (Tobacco/Vaping)  24 hours prior to your procedure  If you use a CPAP at night, you may bring your mask for your overnight stay.   Contacts, glasses, hearing aids,  dentures or partials may not be worn into surgery, please bring cases for these belongings   Patients discharged the day of surgery will not be allowed to drive home, and someone needs to stay with them for 24 hours.   Special instructions:    Oral Hygiene is also important to reduce your risk of infection.  Remember - BRUSH YOUR TEETH THE MORNING OF SURGERY WITH YOUR REGULAR TOOTHPASTE   Day of Surgery:  Take a shower the day of or night before with antibacterial soap. Wear Clean/Comfortable clothing the morning of surgery Do not apply any deodorants/lotions.   Do not wear jewelry or makeup Do not wear lotions, powders, perfumes/colognes, or deodorant. Do not shave 48 hours prior to surgery.  Men may shave face and neck. Do not bring valuables to the hospital. Do not wear nail polish, gel polish, artificial nails, or any other type of covering on natural nails (fingers and toes) If you have artificial nails or gel coating that need to be removed by a nail salon, please have this removed prior to surgery. Artificial nails or gel coating may interfere with anesthesia's ability to adequately monitor your vital signs. Remember to brush your teeth WITH YOUR REGULAR TOOTHPASTE.

## 2022-12-22 ENCOUNTER — Encounter (HOSPITAL_COMMUNITY): Payer: Self-pay | Admitting: Vascular Surgery

## 2022-12-22 ENCOUNTER — Ambulatory Visit (HOSPITAL_COMMUNITY)
Admission: RE | Admit: 2022-12-22 | Discharge: 2022-12-22 | Disposition: A | Discharge status: Home / Self Care | Payer: Managed Care, Other (non HMO) | Attending: Vascular Surgery | Admitting: Vascular Surgery

## 2022-12-22 ENCOUNTER — Ambulatory Visit (HOSPITAL_COMMUNITY): Payer: Managed Care, Other (non HMO) | Admitting: Physician Assistant

## 2022-12-22 ENCOUNTER — Other Ambulatory Visit: Payer: Self-pay

## 2022-12-22 ENCOUNTER — Encounter (HOSPITAL_COMMUNITY)
Admission: RE | Disposition: A | Discharge status: Home / Self Care | Payer: Self-pay | Source: Home / Self Care | Attending: Vascular Surgery

## 2022-12-22 ENCOUNTER — Other Ambulatory Visit (HOSPITAL_COMMUNITY): Payer: Self-pay

## 2022-12-22 DIAGNOSIS — I428 Other cardiomyopathies: Secondary | ICD-10-CM | POA: Insufficient documentation

## 2022-12-22 DIAGNOSIS — N185 Chronic kidney disease, stage 5: Secondary | ICD-10-CM

## 2022-12-22 DIAGNOSIS — I509 Heart failure, unspecified: Secondary | ICD-10-CM

## 2022-12-22 DIAGNOSIS — I5022 Chronic systolic (congestive) heart failure: Secondary | ICD-10-CM | POA: Insufficient documentation

## 2022-12-22 DIAGNOSIS — Z992 Dependence on renal dialysis: Secondary | ICD-10-CM | POA: Insufficient documentation

## 2022-12-22 DIAGNOSIS — I132 Hypertensive heart and chronic kidney disease with heart failure and with stage 5 chronic kidney disease, or end stage renal disease: Secondary | ICD-10-CM

## 2022-12-22 DIAGNOSIS — T82898A Other specified complication of vascular prosthetic devices, implants and grafts, initial encounter: Secondary | ICD-10-CM

## 2022-12-22 DIAGNOSIS — G4733 Obstructive sleep apnea (adult) (pediatric): Secondary | ICD-10-CM | POA: Insufficient documentation

## 2022-12-22 DIAGNOSIS — E785 Hyperlipidemia, unspecified: Secondary | ICD-10-CM | POA: Insufficient documentation

## 2022-12-22 DIAGNOSIS — E669 Obesity, unspecified: Secondary | ICD-10-CM | POA: Insufficient documentation

## 2022-12-22 DIAGNOSIS — N186 End stage renal disease: Secondary | ICD-10-CM

## 2022-12-22 DIAGNOSIS — Z6841 Body Mass Index (BMI) 40.0 and over, adult: Secondary | ICD-10-CM | POA: Insufficient documentation

## 2022-12-22 HISTORY — PX: FISTULA SUPERFICIALIZATION: SHX6341

## 2022-12-22 LAB — POCT I-STAT, CHEM 8
BUN: 50 mg/dL — ABNORMAL HIGH (ref 6–20)
Calcium, Ion: 0.94 mmol/L — ABNORMAL LOW (ref 1.15–1.40)
Chloride: 99 mmol/L (ref 98–111)
Creatinine, Ser: 11.9 mg/dL — ABNORMAL HIGH (ref 0.61–1.24)
Glucose, Bld: 94 mg/dL (ref 70–99)
HCT: 38 % — ABNORMAL LOW (ref 39.0–52.0)
Hemoglobin: 12.9 g/dL — ABNORMAL LOW (ref 13.0–17.0)
Potassium: 5.3 mmol/L — ABNORMAL HIGH (ref 3.5–5.1)
Sodium: 134 mmol/L — ABNORMAL LOW (ref 135–145)
TCO2: 27 mmol/L (ref 22–32)

## 2022-12-22 SURGERY — FISTULA SUPERFICIALIZATION
Anesthesia: Monitor Anesthesia Care | Laterality: Left

## 2022-12-22 MED ORDER — SODIUM CHLORIDE 0.9 % IV SOLN: INTRAVENOUS | Status: DC

## 2022-12-22 MED ORDER — PROPOFOL 10 MG/ML IV BOLUS
INTRAVENOUS | Status: AC
  Filled 2022-12-22: qty 20

## 2022-12-22 MED ORDER — CHLORHEXIDINE GLUCONATE 4 % EX SOLN: 60.0000 mL | Freq: Once | CUTANEOUS | Status: DC

## 2022-12-22 MED ORDER — HEPARIN 6000 UNIT IRRIGATION SOLUTION
Status: AC
  Filled 2022-12-22: qty 500

## 2022-12-22 MED ORDER — MIDAZOLAM HCL 2 MG/2ML IJ SOLN
INTRAMUSCULAR | Status: DC | PRN
  Administered 2022-12-22 (×2): 1 mg via INTRAVENOUS

## 2022-12-22 MED ORDER — LIDOCAINE HCL (PF) 1 % IJ SOLN
INTRAMUSCULAR | Status: AC
  Filled 2022-12-22: qty 30

## 2022-12-22 MED ORDER — 0.9 % SODIUM CHLORIDE (POUR BTL) OPTIME
TOPICAL | Status: DC | PRN
  Administered 2022-12-22: 1000 mL

## 2022-12-22 MED ORDER — CHLORHEXIDINE GLUCONATE 0.12 % MT SOLN
15.0000 mL | Freq: Once | OROMUCOSAL | Status: AC
  Administered 2022-12-22: 15 mL via OROMUCOSAL
  Filled 2022-12-22: qty 15

## 2022-12-22 MED ORDER — MEPIVACAINE HCL (PF) 2 % IJ SOLN
INTRAMUSCULAR | Status: DC | PRN
  Administered 2022-12-22: 10 mL

## 2022-12-22 MED ORDER — CEFAZOLIN IN SODIUM CHLORIDE 3-0.9 GM/100ML-% IV SOLN
3.0000 g | INTRAVENOUS | Status: DC
  Filled 2022-12-22: qty 100

## 2022-12-22 MED ORDER — ORAL CARE MOUTH RINSE: 15.0000 mL | Freq: Once | OROMUCOSAL | Status: AC

## 2022-12-22 MED ORDER — LIDOCAINE-EPINEPHRINE (PF) 1 %-1:200000 IJ SOLN
INTRAMUSCULAR | Status: AC
  Filled 2022-12-22: qty 30

## 2022-12-22 MED ORDER — LIDOCAINE-EPINEPHRINE (PF) 1 %-1:200000 IJ SOLN
INTRAMUSCULAR | Status: DC | PRN
  Administered 2022-12-22: 20 mL

## 2022-12-22 MED ORDER — PROPOFOL 10 MG/ML IV BOLUS
INTRAVENOUS | Status: DC | PRN
  Administered 2022-12-22: 10 mg via INTRAVENOUS
  Administered 2022-12-22 (×3): 20 mg via INTRAVENOUS

## 2022-12-22 MED ORDER — LIDOCAINE-EPINEPHRINE (PF) 1.5 %-1:200000 IJ SOLN
INTRAMUSCULAR | Status: DC | PRN
  Administered 2022-12-22: 20 mL via PERINEURAL

## 2022-12-22 MED ORDER — HEPARIN 6000 UNIT IRRIGATION SOLUTION
Status: DC | PRN
  Administered 2022-12-22: 1

## 2022-12-22 MED ORDER — HEPARIN SODIUM (PORCINE) 1000 UNIT/ML IJ SOLN
1.6000 mL | Freq: Once | INTRAMUSCULAR | Status: AC
  Administered 2022-12-22: 1600 [IU] via INTRAVENOUS
  Filled 2022-12-22: qty 1.6

## 2022-12-22 MED ORDER — DEXTROSE 5 % IV SOLN
INTRAVENOUS | Status: DC | PRN
  Administered 2022-12-22: 3 g via INTRAVENOUS

## 2022-12-22 MED ORDER — MIDAZOLAM HCL 2 MG/2ML IJ SOLN
INTRAMUSCULAR | Status: AC
  Filled 2022-12-22: qty 2

## 2022-12-22 MED ORDER — DEXMEDETOMIDINE HCL IN NACL 80 MCG/20ML IV SOLN
INTRAVENOUS | Status: AC
  Filled 2022-12-22: qty 20

## 2022-12-22 MED ORDER — DEXMEDETOMIDINE HCL IN NACL 80 MCG/20ML IV SOLN
INTRAVENOUS | Status: DC | PRN
  Administered 2022-12-22 (×2): 8 ug via INTRAVENOUS

## 2022-12-22 MED ORDER — OXYCODONE-ACETAMINOPHEN 5-325 MG PO TABS
1.0000 | ORAL_TABLET | Freq: Four times a day (QID) | ORAL | 0 refills | Status: DC | PRN
  Filled 2022-12-22: qty 30, 7d supply, fill #0

## 2022-12-22 SURGICAL SUPPLY — 29 items
ARMBAND PINK RESTRICT EXTREMIT (MISCELLANEOUS) ×1 IMPLANT
BAG COUNTER SPONGE SURGICOUNT (BAG) ×1 IMPLANT
CANISTER SUCT 3000ML PPV (MISCELLANEOUS) ×1 IMPLANT
CLIP LIGATING EXTRA MED SLVR (CLIP) ×1 IMPLANT
CLIP LIGATING EXTRA SM BLUE (MISCELLANEOUS) ×1 IMPLANT
COVER PROBE W GEL 5X96 (DRAPES) ×1 IMPLANT
DERMABOND ADVANCED .7 DNX12 (GAUZE/BANDAGES/DRESSINGS) ×1 IMPLANT
DERMABOND ADVANCED .7 DNX6 (GAUZE/BANDAGES/DRESSINGS) IMPLANT
ELECT REM PT RETURN 9FT ADLT (ELECTROSURGICAL) ×1
ELECTRODE REM PT RTRN 9FT ADLT (ELECTROSURGICAL) ×1 IMPLANT
GLOVE BIO SURGEON STRL SZ7.5 (GLOVE) ×1 IMPLANT
GOWN STRL REUS W/ TWL LRG LVL3 (GOWN DISPOSABLE) ×2 IMPLANT
GOWN STRL REUS W/ TWL XL LVL3 (GOWN DISPOSABLE) ×1 IMPLANT
KIT BASIN OR (CUSTOM PROCEDURE TRAY) ×1 IMPLANT
KIT TURNOVER KIT B (KITS) ×1 IMPLANT
NS IRRIG 1000ML POUR BTL (IV SOLUTION) ×1 IMPLANT
PACK CV ACCESS (CUSTOM PROCEDURE TRAY) ×1 IMPLANT
PAD ARMBOARD 7.5X6 YLW CONV (MISCELLANEOUS) ×2 IMPLANT
POWDER SURGICEL 3.0 GRAM (HEMOSTASIS) IMPLANT
SLING ARM FOAM STRAP LRG (SOFTGOODS) IMPLANT
SLING ARM FOAM STRAP MED (SOFTGOODS) IMPLANT
SUT MNCRL AB 4-0 PS2 18 (SUTURE) ×1 IMPLANT
SUT PROLENE 5 0 C 1 24 (SUTURE) IMPLANT
SUT PROLENE 6 0 BV (SUTURE) ×1 IMPLANT
SUT SILK 2 0 SH (SUTURE) IMPLANT
SUT VIC AB 3-0 SH 27X BRD (SUTURE) ×1 IMPLANT
TOWEL GREEN STERILE (TOWEL DISPOSABLE) ×1 IMPLANT
UNDERPAD 30X36 HEAVY ABSORB (UNDERPADS AND DIAPERS) ×1 IMPLANT
WATER STERILE IRR 1000ML POUR (IV SOLUTION) ×1 IMPLANT

## 2022-12-22 NOTE — Anesthesia Procedure Notes (Signed)
Anesthesia Procedure Image    

## 2022-12-22 NOTE — Transfer of Care (Signed)
Immediate Anesthesia Transfer of Care Note  Patient: Frank Coffey  Procedure(s) Performed: LEFT ARM FISTULA SUPERFICIALIZATION (Left)  Patient Location: PACU  Anesthesia Type:MAC and Regional  Level of Consciousness: awake, alert , and oriented  Airway & Oxygen Therapy: Patient Spontanous Breathing and Patient connected to nasal cannula oxygen  Post-op Assessment: Report given to RN and Post -op Vital signs reviewed and stable  Post vital signs: Reviewed and stable  Last Vitals:  Vitals Value Taken Time  BP 150/57 12/22/22 0933  Temp    Pulse 69 12/22/22 0935  Resp 17 12/22/22 0935  SpO2 93 % 12/22/22 0935  Vitals shown include unfiled device data.  Last Pain:  Vitals:   12/22/22 0933  TempSrc:   PainSc: 0-No pain         Complications: No notable events documented.

## 2022-12-22 NOTE — Anesthesia Procedure Notes (Signed)
Anesthesia Regional Block: Supraclavicular block   Pre-Anesthetic Checklist: , timeout performed,  Correct Patient, Correct Site, Correct Laterality,  Correct Procedure, Correct Position, site marked,  Risks and benefits discussed,  Surgical consent,  Pre-op evaluation,  At surgeon's request and post-op pain management  Laterality: Left  Prep: chloraprep       Needles:  Injection technique: Single-shot  Needle Type: Echogenic Needle     Needle Length: 9cm      Additional Needles:   Procedures:,,,, ultrasound used (permanent image in chart),,    Narrative:  Start time: 12/22/2022 7:11 AM End time: 12/22/2022 7:21 AM Injection made incrementally with aspirations every 5 mL.  Performed by: Personally  Anesthesiologist: Eilene Ghazi, MD  Additional Notes: Patient tolerated the procedure well without complications

## 2022-12-22 NOTE — Anesthesia Postprocedure Evaluation (Signed)
Anesthesia Post Note  Patient: Frank Coffey  Procedure(s) Performed: LEFT ARM FISTULA SUPERFICIALIZATION (Left)     Patient location during evaluation: PACU Anesthesia Type: MAC Level of consciousness: awake and alert Pain management: pain level controlled Vital Signs Assessment: post-procedure vital signs reviewed and stable Respiratory status: spontaneous breathing, nonlabored ventilation, respiratory function stable and patient connected to nasal cannula oxygen Cardiovascular status: stable and blood pressure returned to baseline Postop Assessment: no apparent nausea or vomiting Anesthetic complications: no  No notable events documented.  Last Vitals:  Vitals:   12/22/22 0933 12/22/22 0945  BP: (!) 150/57 (!) 144/100  Pulse: 71 79  Resp: 18 17  Temp: 36.7 C 36.9 C  SpO2: 93% 92%    Last Pain:  Vitals:   12/22/22 0933  TempSrc:   PainSc: 0-No pain                 Shanaiya Bene S

## 2022-12-22 NOTE — H&P (Signed)
HPI:  This is a 42 y.o. male who is s/p S/P left BC fistula creation 09/01/22 by Dr. Randie Heinz this is his third attempt for permanent access.  He has a functioning TDC that was placed over a year ago.     Pt returns today for follow up.  Pt denies loss of sensation, motor, and no pain in the left hand.       Allergies  No Known Allergies           Current Outpatient Medications  Medication Sig Dispense Refill   albuterol (VENTOLIN HFA) 108 (90 Base) MCG/ACT inhaler Inhale 1-2 puffs into the lungs every 6 (six) hours as needed for wheezing (cough). 8 g 2   allopurinol (ZYLOPRIM) 300 MG tablet Take 0.5 tablets (150 mg total) by mouth every dialysis (after dialysis). 15 tablet 0   carvedilol (COREG) 25 MG tablet TAKE 1 TABLET(25 MG) BY MOUTH TWICE DAILY WITH A MEAL 60 tablet 3   colchicine 0.6 MG tablet Take 0.6 mg by mouth.       dorzolamide-timolol (COSOPT) 22.3-6.8 MG/ML ophthalmic solution Place 1 drop into the left eye 2 (two) times daily.       Doxercalciferol (HECTOROL IV) as directed. On dialysis days       doxycycline (VIBRA-TABS) 100 MG tablet Take 1 tablet (100 mg total) by mouth 2 (two) times daily. (Patient not taking: Reported on 08/31/2022) 14 tablet 0   HYDROcodone-acetaminophen (NORCO/VICODIN) 5-325 MG tablet Take 1 tablet by mouth every 6 (six) hours as needed for moderate pain. 12 tablet 0   ipratropium (ATROVENT) 0.03 % nasal spray Place 2 sprays into both nostrils every 12 (twelve) hours. (Patient taking differently: Place 2 sprays into both nostrils 2 (two) times daily as needed for rhinitis.) 30 mL 12   isosorbide mononitrate (IMDUR) 60 MG 24 hr tablet Take 60 mg by mouth daily.       latanoprost (XALATAN) 0.005 % ophthalmic solution Place 1 drop into the left eye at bedtime.       lidocaine-prilocaine (EMLA) cream Apply 1 Application topically once. Dialysis days       LINZESS 145 MCG CAPS capsule TAKE 1 CAPSULE(145 MCG) BY MOUTH DAILY BEFORE BREAKFAST. GIVEN** (Patient  not taking: Reported on 10/25/2022) 90 capsule 3   promethazine-dextromethorphan (PROMETHAZINE-DM) 6.25-15 MG/5ML syrup Take 5 mLs by mouth 4 (four) times daily as needed for cough. 118 mL 0   sevelamer carbonate (RENVELA) 800 MG tablet Take 800 mg by mouth 3 (three) times daily with meals.       XPHOZAH 30 MG TABS Take 30 mg by mouth 2 (two) times daily.                   Current Facility-Administered Medications  Medication Dose Route Frequency Provider Last Rate Last Admin   0.9 %  sodium chloride infusion  250 mL Intravenous PRN Maeola Harman, MD       sodium chloride flush (NS) 0.9 % injection 3 mL  3 mL Intravenous Q12H Maeola Harman, MD             ROS:  See HPI   Physical Exam:     Vitals:   12/22/22 0549  BP: (!) 153/86  Pulse: 89  Resp: 20  Temp: 98.2 F (36.8 C)  SpO2: 90%      Incision:  left AC UE incision has healed well Extremities:  palpable radial pulse and thrill in the fistula. Neuro: sensation  and motor intact B UE equal   Findings:  +--------------------+----------+-----------------+--------+  AVF                PSV (cm/s)Flow Vol (mL/min)Comments  +--------------------+----------+-----------------+--------+  Native artery inflow   381          2321                 +--------------------+----------+-----------------+--------+  AVF Anastomosis        449                               +--------------------+----------+-----------------+--------+     +------------+----------+-------------+----------+--------------+  OUTFLOW VEINPSV (cm/s)Diameter (cm)Depth (cm)   Describe     +------------+----------+-------------+----------+--------------+  Prox UA        149        0.87        2.19                   +------------+----------+-------------+----------+--------------+  Mid UA         131        0.73        1.90                   +------------+----------+-------------+----------+--------------+   Dist UA        200        0.93        0.87   0.63 cm branch  +------------+----------+-------------+----------+--------------+  AC Fossa       517        0.58        0.63                   +------------+----------+-------------+----------+--------------+     Summary:  Patent left BC AVF.  Flow volume is 2321 ml/min.  No areas of significant stenosis.  No thrombus seen.      Assessment/Plan:  This is a 42 y.o. male who is s/p:Left BC AV fistula creation on 09/01/22.  The diameter is excellent with good flow volume.  The fistula is too deep to access > 0.6-0.2.9 cm.  Plan is for superficialization versus transposition today in the OR.  We have discussed the risk benefits alternatives and he demonstrates good understanding.   Trianna Lupien C. Randie Heinz, MD [facility] Office: [phone] Pager: [phone]

## 2022-12-22 NOTE — Op Note (Signed)
Patient name: Frank Coffey MRN: [id number] DOB: 1980/06/16 Sex: male  12/22/2022 Pre-operative Diagnosis: ESRD Post-operative diagnosis:  Same Surgeon:  Apolinar Junes C. Randie Heinz, MD Assistant: Clinton Gallant, PA Procedure Performed:  Revision of left arm cephalic vein AV fistula with transposition  Indications: 42 year old male with history of end-stage renal disease currently dialyzing via catheter.  He has 2 previous failed upper extremity fistulas on the right and now has a cephalic vein fistula which is working well but is too deep for cannulation.  He is now indicated for revision with transposition versus superficialization.  Experience assistant was necessary to facilitate exposure of the entire cephalic vein as well as transposition to a lateral position and performing anastomosis.  Findings: The fistula measured approximately a centimeter in the distal upper arm and then more proximally after there were multiple branches and was down to approximately 6 mm in external diameter but had a very strong thrill.  There was tortuosity throughout the fistula and for this reason we elected to tunneled laterally.  At completion there was very strong flow throughout the fistula and a palpable radial artery pulse at the wrist.   Procedure:  The patient was identified in the holding area and taken to the operating where he was placed supine on the operative table and MAC anesthesia was induced.  A preoperative block of been placed.  He was sterilely prepped and draped in the left upper extremity in the usual fashion, antibiotics were administered a timeout was called.  Ultrasound was used to identify the fistula in the upper arm and this was marked as well as the branches on the skin.  The block was tested and was noted to be somewhat intact however we did use additional 1% lidocaine with epinephrine to obtain better analgesia.  We then made 2 vertical incisions on the arm and dissected the fistula out throughout  its course and divided multiple branches between ties.  When the entire fistula was dissected out I elected for transposition and it was marked for orientation clamp near the anastomosis transected and tunneled laterally.  The tunnel was somewhat tight given dense subcutaneous tissue and this was widened manually.  Both ends were spatulated and the distal end was flushed with heparinized saline and clamped.  I then sewed them end to end initially with 6-0 Prolene suture.  Upon completion there was very strong flow through the fistula but in manipulating the fistula the anastomosis began to degenerate and it was reclamped.  With this we dissected the fistula further free to relieve any tension.  I then spatulated both ends again and sewed them end to end with 5-0 Prolene suture.  This time prior to completion we allowed flushing all directions and after completion we did have to repair 1 edge medially with a 6-0 Prolene pledgeted suture and this did obtain hemostasis.  Again we freed some soft tissue and some other connective tissue proximally distally and the fistula was free of tension.  Satisfied with this we irrigated the wounds we then closed some of the tissue deep with interrupted 3-0 Vicryl suture in both incisions and then closed with interrupted 3-0 Vicryl suture over the fistula and then closed the skin layers with 4-0 Monocryl.  Dermabond is placed at the skin level.  The patient was then awakened from anesthesia having tolerated the procedure well without immediate complication.  All counts were correct at completion.`  EBL: 200cc   Jatoria Kneeland C. Randie Heinz, MD [facility] Central Office: [phone] Pager: [phone]

## 2022-12-22 NOTE — Progress Notes (Signed)
K 5.3 this am. Dr. Okey Dupre is aware

## 2022-12-23 ENCOUNTER — Encounter (HOSPITAL_COMMUNITY): Payer: Self-pay | Admitting: Vascular Surgery

## 2022-12-23 ENCOUNTER — Other Ambulatory Visit: Payer: Self-pay | Admitting: Nurse Practitioner

## 2022-12-23 ENCOUNTER — Telehealth: Payer: Managed Care, Other (non HMO) | Admitting: Nurse Practitioner

## 2022-12-23 DIAGNOSIS — K219 Gastro-esophageal reflux disease without esophagitis: Secondary | ICD-10-CM

## 2022-12-23 MED ORDER — FAMOTIDINE 20 MG PO TABS: 20.0000 mg | ORAL_TABLET | Freq: Two times a day (BID) | ORAL | 0 refills | Status: DC

## 2022-12-23 NOTE — Progress Notes (Signed)
I have spent 5 minutes in review of e-visit questionnaire, review and updating patient chart, medical decision making and response to patient.  ° °Jerrell Mangel W Secilia Apps, NP ° °  °

## 2022-12-23 NOTE — Progress Notes (Signed)
E-Visit for Heartburn  We are sorry that you are not feeling well.  Here is how we plan to help!  Based on what you shared with me it looks like you most likely have Gastroesophageal Reflux Disease (GERD)  Gastroesophageal reflux disease (GERD) happens when acid from your stomach flows up into the esophagus.  When acid comes in contact with the esophagus, the acid causes sorenss (inflammation) in the esophagus.  Over time, GERD may create small holes (ulcers) in the lining of the esophagus.  I have prescribed Pepcid 20 mg one by mouth twice a day for two weeks.  Your symptoms should improve in the next day or two.  You can use antacids as needed until symptoms resolve.  Call us if your heartburn worsens, you have trouble swallowing, weight loss, spitting up blood or recurrent vomiting.  Home Care: May include lifestyle changes such as weight loss, quitting smoking and alcohol consumption Avoid foods and drinks that make your symptoms worse, such as: Caffeine or alcoholic drinks Chocolate Peppermint or mint flavorings Garlic and onions Spicy foods Citrus fruits, such as oranges, lemons, or limes Tomato-based foods such as sauce, chili, salsa and pizza Fried and fatty foods Avoid lying down for 3 hours prior to your bedtime or prior to taking a nap Eat small, frequent meals instead of a large meals Wear loose-fitting clothing.  Do not wear anything tight around your waist that causes pressure on your stomach. Raise the head of your bed 6 to 8 inches with wood blocks to help you sleep.  Extra pillows will not help.  Seek Help Right Away If: You have pain in your arms, neck, jaw, teeth or back Your pain increases or changes in intensity or duration You develop nausea, vomiting or sweating (diaphoresis) You develop shortness of breath or you faint Your vomit is green, yellow, black or looks like coffee grounds or blood Your stool is red, bloody or black  These symptoms could be signs  of other problems, such as heart disease, gastric bleeding or esophageal bleeding.  Make sure you : Understand these instructions. Will watch your condition. Will get help right away if you are not doing well or get worse.  Your e-visit answers were reviewed by a board certified advanced clinical practitioner to complete your personal care plan.  Depending on the condition, your plan could have included both over the counter or prescription medications.  If there is a problem please reply  once you have received a response from your provider.  Your safety is important to [place].  If you have drug allergies check your prescription carefully.    You can use MyChart to ask questions about today's visit, request a non-urgent call back, or ask for a work or school excuse for 24 hours related to this e-Visit. If it has been greater than 24 hours you will need to follow up with your provider, or enter a new e-Visit to address those concerns.  You will get an e-mail in the next two days asking about your experience.  I hope that your e-visit has been valuable and will speed your recovery. Thank you for using e-visits.

## 2022-12-25 NOTE — Telephone Encounter (Signed)
Requested Prescriptions  Refused Prescriptions Disp Refills   famotidine (PEPCID) 20 MG tablet [Pharmacy Med Name: FAMOTIDINE 20MG  TABLETS] 180 tablet     Sig: TAKE 1 TABLET(20 MG) BY MOUTH TWICE DAILY     There is no refill protocol information for this order

## 2023-01-06 ENCOUNTER — Other Ambulatory Visit: Payer: Self-pay | Admitting: Nurse Practitioner

## 2023-01-06 DIAGNOSIS — K5904 Chronic idiopathic constipation: Secondary | ICD-10-CM

## 2023-01-12 ENCOUNTER — Telehealth: Payer: Managed Care, Other (non HMO) | Admitting: Physician Assistant

## 2023-01-12 DIAGNOSIS — A084 Viral intestinal infection, unspecified: Secondary | ICD-10-CM | POA: Diagnosis not present

## 2023-01-12 MED ORDER — LOPERAMIDE HCL 2 MG PO TABS: 2.0000 mg | ORAL_TABLET | Freq: Four times a day (QID) | ORAL | 0 refills | Status: DC | PRN

## 2023-01-12 MED ORDER — ONDANSETRON 4 MG PO TBDP: 4.0000 mg | ORAL_TABLET | Freq: Three times a day (TID) | ORAL | 0 refills | Status: DC | PRN

## 2023-01-12 NOTE — Progress Notes (Signed)
E-Visit for Nausea and Vomiting   We are sorry that you are not feeling well. Here is how we plan to help!  Based on what you have shared with me it looks like you have a Virus that is irritating your GI tract.  Vomiting is the forceful emptying of a portion of the stomach's content through the mouth.  Although nausea and vomiting can make you feel miserable, it's important to remember that these are not diseases, but rather symptoms of an underlying illness.  When we treat short term symptoms, we always caution that any symptoms that persist should be fully evaluated in a medical office.  I have prescribed a medication that will help alleviate your symptoms and allow you to stay hydrated:  Zofran 4 mg 1 tablet every 8 hours as needed for nausea and vomiting Imodium 2mg  Take 1 tablet every 6 hours as needed for diarrhea  HOME CARE: Drink clear liquids.  This is very important! Dehydration (the lack of fluid) can lead to a serious complication.  Start off with 1 tablespoon every 5 minutes for 8 hours. You may begin eating bland foods after 8 hours without vomiting.  Start with saltine crackers, white bread, rice, mashed potatoes, applesauce. After 48 hours on a bland diet, you may resume a normal diet. Try to go to sleep.  Sleep often empties the stomach and relieves the need to vomit.  GET HELP RIGHT AWAY IF:  Your symptoms do not improve or worsen within 2 days after treatment. You have a fever for over 3 days. You cannot keep down fluids after trying the medication.  MAKE SURE YOU:  Understand these instructions. Will watch your condition. Will get help right away if you are not doing well or get worse.    Thank you for choosing an e-visit.  Your e-visit answers were reviewed by a board certified advanced clinical practitioner to complete your personal care plan. Depending upon the condition, your plan could have included both over the counter or prescription medications.  Please  review your pharmacy choice. Make sure the pharmacy is open so you can pick up prescription now. If there is a problem, you may contact your provider through [employer] and have the prescription routed to another pharmacy.  Your safety is important to [place]. If you have drug allergies check your prescription carefully.   For the next 24 hours you can use MyChart to ask questions about today's visit, request a non-urgent call back, or ask for a work or school excuse. You will get an email in the next two days asking about your experience. I hope that your e-visit has been valuable and will speed your recovery.  I have spent 5 minutes in review of e-visit questionnaire, review and updating patient chart, medical decision making and response to patient.   Mar Daring, PA-C

## 2023-01-23 ENCOUNTER — Ambulatory Visit (HOSPITAL_COMMUNITY)
Admission: RE | Admit: 2023-01-23 | Discharge: 2023-01-23 | Disposition: A | Discharge status: Home / Self Care | Payer: Managed Care, Other (non HMO) | Source: Ambulatory Visit | Attending: Internal Medicine | Admitting: Internal Medicine

## 2023-01-23 ENCOUNTER — Other Ambulatory Visit: Payer: Self-pay

## 2023-01-23 ENCOUNTER — Encounter (HOSPITAL_COMMUNITY)
Admission: RE | Disposition: A | Discharge status: Home / Self Care | Payer: Self-pay | Source: Ambulatory Visit | Attending: Internal Medicine

## 2023-01-23 DIAGNOSIS — G4733 Obstructive sleep apnea (adult) (pediatric): Secondary | ICD-10-CM | POA: Insufficient documentation

## 2023-01-23 DIAGNOSIS — T8249XA Other complication of vascular dialysis catheter, initial encounter: Secondary | ICD-10-CM | POA: Diagnosis present

## 2023-01-23 DIAGNOSIS — I509 Heart failure, unspecified: Secondary | ICD-10-CM | POA: Diagnosis not present

## 2023-01-23 DIAGNOSIS — I132 Hypertensive heart and chronic kidney disease with heart failure and with stage 5 chronic kidney disease, or end stage renal disease: Secondary | ICD-10-CM | POA: Diagnosis not present

## 2023-01-23 DIAGNOSIS — Z6841 Body Mass Index (BMI) 40.0 and over, adult: Secondary | ICD-10-CM | POA: Diagnosis not present

## 2023-01-23 DIAGNOSIS — Z992 Dependence on renal dialysis: Secondary | ICD-10-CM | POA: Diagnosis not present

## 2023-01-23 DIAGNOSIS — Y839 Surgical procedure, unspecified as the cause of abnormal reaction of the patient, or of later complication, without mention of misadventure at the time of the procedure: Secondary | ICD-10-CM | POA: Insufficient documentation

## 2023-01-23 DIAGNOSIS — D631 Anemia in chronic kidney disease: Secondary | ICD-10-CM | POA: Diagnosis not present

## 2023-01-23 DIAGNOSIS — N186 End stage renal disease: Secondary | ICD-10-CM | POA: Insufficient documentation

## 2023-01-23 DIAGNOSIS — E669 Obesity, unspecified: Secondary | ICD-10-CM | POA: Diagnosis not present

## 2023-01-23 HISTORY — PX: DIALYSIS/PERMA CATHETER REMOVAL: CATH118289

## 2023-01-23 HISTORY — PX: DIALYSIS/PERMA CATHETER INSERTION: CATH118288

## 2023-01-23 HISTORY — PX: PERIPHERAL VASCULAR BALLOON ANGIOPLASTY: CATH118281

## 2023-01-23 SURGERY — DIALYSIS/PERMA CATHETER REMOVAL
Anesthesia: LOCAL | Laterality: Right

## 2023-01-23 MED ORDER — MIDAZOLAM HCL 2 MG/2ML IJ SOLN
INTRAMUSCULAR | Status: DC | PRN
  Administered 2023-01-23: 1 mg via INTRAVENOUS

## 2023-01-23 MED ORDER — FENTANYL CITRATE (PF) 100 MCG/2ML IJ SOLN
INTRAMUSCULAR | Status: DC | PRN
  Administered 2023-01-23: 25 ug via INTRAVENOUS

## 2023-01-23 MED ORDER — LIDOCAINE HCL (PF) 1 % IJ SOLN
INTRAMUSCULAR | Status: DC | PRN
  Administered 2023-01-23: 10 mL via INTRADERMAL

## 2023-01-23 MED ORDER — LIDOCAINE HCL (PF) 1 % IJ SOLN
INTRAMUSCULAR | Status: AC
  Filled 2023-01-23: qty 30

## 2023-01-23 MED ORDER — ONDANSETRON HCL 4 MG/2ML IJ SOLN: 4.0000 mg | Freq: Four times a day (QID) | INTRAMUSCULAR | Status: DC | PRN

## 2023-01-23 MED ORDER — ACETAMINOPHEN 325 MG PO TABS: 650.0000 mg | ORAL_TABLET | ORAL | Status: DC | PRN

## 2023-01-23 MED ORDER — HEPARIN SODIUM (PORCINE) 1000 UNIT/ML IJ SOLN
INTRAMUSCULAR | Status: DC | PRN
  Administered 2023-01-23 (×2): 1600 [IU] via INTRAVENOUS

## 2023-01-23 MED ORDER — HEPARIN (PORCINE) IN NACL 1000-0.9 UT/500ML-% IV SOLN
INTRAVENOUS | Status: DC | PRN
  Administered 2023-01-23: 500 mL

## 2023-01-23 MED ORDER — IODIXANOL 320 MG/ML IV SOLN
INTRAVENOUS | Status: DC | PRN
  Administered 2023-01-23: 5 mL via INTRAVENOUS

## 2023-01-23 MED ORDER — HEPARIN SODIUM (PORCINE) 1000 UNIT/ML IJ SOLN
INTRAMUSCULAR | Status: AC
  Filled 2023-01-23: qty 10

## 2023-01-23 MED ORDER — FENTANYL CITRATE (PF) 100 MCG/2ML IJ SOLN
INTRAMUSCULAR | Status: AC
  Filled 2023-01-23: qty 2

## 2023-01-23 MED ORDER — ACETAMINOPHEN 325 MG PO TABS
ORAL_TABLET | ORAL | Status: AC
  Administered 2023-01-23: 650 mg via ORAL
  Filled 2023-01-23: qty 2

## 2023-01-23 MED ORDER — SODIUM CHLORIDE 0.9 % IV SOLN: INTRAVENOUS | Status: DC

## 2023-01-23 MED ORDER — MIDAZOLAM HCL 2 MG/2ML IJ SOLN
INTRAMUSCULAR | Status: AC
  Filled 2023-01-23: qty 2

## 2023-01-23 SURGICAL SUPPLY — 7 items
BALLN MUSTANG 8X80X75 (BALLOONS) ×2
BALLOON MUSTANG 8X80X75 (BALLOONS) IMPLANT
CATH PALINDROME 19 SP (CATHETERS) IMPLANT
COVER DOME SNAP 22 D (MISCELLANEOUS) IMPLANT
GUIDEWIRE ANGLED .035X150CM (WIRE) IMPLANT
SYR MEDALLION 10ML (SYRINGE) IMPLANT
TRAY PV CATH (CUSTOM PROCEDURE TRAY) ×2 IMPLANT

## 2023-01-23 NOTE — Op Note (Signed)
 Patient presents with hole in venous port of his right IJ tunneled hemodialysis catheter (19 cm cuff to tip- Palindrome) that was placed by IR here in 05/2022.  On examination, aspiration from both ports is good. Chest x-ray confirms the catheter tip is appropriately positioned in the RA. The catheter cuff is 1/2cm deep to the exit site.    Summary:  1) The patient had a successful 19 cm CTT Palindrome hemodialysis catheter exchange in the right internal jugular vein. 2) 80% SVC restriction by fibrin sheath treated with 8mm balloon angioplasty to full effacement at 16 ATM. 3) Okay to use catheter immediately.  Description of procedure: The right neck, chest and the catheter were prepped and draped in the usual sterile fashion. The exit site and adjacent tunnel tract were anesthetized with lidocaine  1% with epinephrine . The cuff was dissected free with a curved Kelly and manual traction. The catheter was withdrawn and venogram through each of the ports was performed.  There was an 80% SVC restriction from a fibrin sheath noted.    A hydrophilic wire was manipulated and advanced through the arterial port and the tip was parked in the IVC. The catheter was completely removed and noted to be entirely intact.  Next 8x3mm Mustang angioplasty balloon was inserted over the wire and positioned at the site of the fibrin sheath.  Venous angioplasty was completed to full effacement at 16 ATM.  The balloon was deflated and removed over the wire.  A new 23 cm cuff to tip Palindrome catheter was inserted over the guidewire and the tip parked in the right atrium and at that point the cuff was approximately 1 cm deep to the exit site.   Aspiration and flushing of both limbs of the catheter confirmed excellent flow. No kinks were visible on fluoroscopic imaging. Both limbs of the catheter were locked with heparin  and sterile caps were placed.   The hub was anchored on to the chest wall with 2-0 nylon wing sutures.  A  2-0 absorbable suture was used to place a purse string suture at the exit site to prevent bleeding.   Sterile dressings were placed, and the patient returned to recovery in stable condition.  Sedation: 1 mg Versed , 25 mcg Fentanyl   Contrast: 5 mL  Monitoring: Because of the patient's comorbid conditions and sedation during the procedure, continuous EKG monitoring and O2 saturation monitoring was performed throughout the procedure by the RN. There were no abnormal arrhythmias encountered.  Complications: None.  Diagnoses:   T82.49XA Other complication of vascular dialysis catheter (Poor flows) N18.6 End stage renal disease  Z99.2 Dialysis dependence  Procedures Coding:  36581 Tunneled catheter exchange 77001  Fluoroscopy guidance for catheter exchange. V0037 Contrast  Recommendations: Remove the suture in 3 weeks. 2.   Report any blood flow problems to [facility].  Discharge: The patient was discharged home in stable condition. The patient was given education regarding the care of the catheter and specific instructions in case of any problems.

## 2023-01-23 NOTE — Discharge Instructions (Signed)

## 2023-01-23 NOTE — H&P (Signed)
 [facility]  INPATIENT CONSULTATION  Reason for Consultation: Dialysis catheter issue Requesting Provider: Dr. Gearline  HPI: Frank Coffey is an 43 y.o. male with ESRD on HD, HTN, HL, obesity, OSA, anemia, CHF currently presenting for dialysis catheter exchange due to hole in the catheter.    TDC was placed 05/2022 as exchange due to malfunction. At that time had fibrin sheath disrupted.  Catheter is 19cm.  Has maturing LUE AVF - had  revision 12/22/22 and is going to [facility] appt tomorrow to see if it's useable.    Denies f/c/exit site issues current TDC.    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)    IBS (irritable bowel syndrome)    Obesity    OSA (obstructive sleep apnea)    Pneumonia 04/2019   with Covid   Sleep apnea    PSH: Past Surgical History:  Procedure Laterality Date   AV FISTULA PLACEMENT Right 02/17/2022   Procedure: RIGHT ARTERIOVENOUS (AV) FISTULA CREATION;  Surgeon: Sheree Penne Bruckner, MD;  Location: [facility] OR;  Service: Vascular;  Laterality: Right;   AV FISTULA PLACEMENT Right 05/26/2022   Procedure: RIGHT ARM BRACHIOCEPHALIC ARTERIOVENOUS (AV) FISTULA CREATION;  Surgeon: Sheree Penne Bruckner, MD;  Location: [facility] OR;  Service: Vascular;  Laterality: Right;   AV FISTULA PLACEMENT Left 09/01/2022   Procedure: LEFT ARM ARTERIOVENOUS (AV) FISTULA;  Surgeon:  Sheree Penne Bruckner, MD;  Location: [facility] OR;  Service: Vascular;  Laterality: Left;   BIOPSY  02/09/2020   Procedure: BIOPSY;  Surgeon: Wilhelmenia Aloha Raddle., MD;  Location: [facility] ENDOSCOPY;  Service: Gastroenterology;;   CHOLECYSTECTOMY     COLONOSCOPY WITH PROPOFOL  N/A 02/09/2020   Procedure: COLONOSCOPY WITH PROPOFOL ;  Surgeon: Wilhelmenia Aloha Raddle., MD;  Location: [facility] ENDOSCOPY;  Service: Gastroenterology;  Laterality: N/A;   ESOPHAGOGASTRODUODENOSCOPY (EGD) WITH PROPOFOL  N/A 02/09/2020   Procedure: ESOPHAGOGASTRODUODENOSCOPY (EGD) WITH PROPOFOL ;  Surgeon: Wilhelmenia Aloha Raddle., MD;  Location: [facility] ENDOSCOPY;  Service: Gastroenterology;  Laterality: N/A;   EYE SURGERY  01/18/2011   FISTULA SUPERFICIALIZATION Left 12/22/2022   Procedure: LEFT ARM FISTULA SUPERFICIALIZATION;  Surgeon: Sheree Penne Bruckner, MD;  Location: [facility] OR;  Service: Vascular;  Laterality: Left;   IR FLUORO GUIDE CV LINE RIGHT  12/07/2021   IR FLUORO GUIDE CV LINE RIGHT  06/07/2022   IR PTA VENOUS EXCEPT DIALYSIS CIRCUIT  06/07/2022   IR US  GUIDE VASC ACCESS RIGHT  12/07/2021   POLYPECTOMY  02/09/2020   Procedure: POLYPECTOMY;  Surgeon: Mansouraty, Aloha Raddle., MD;  Location: [facility] ENDOSCOPY;  Service: Gastroenterology;;   RETINAL DETACHMENT REPAIR W/ SCLERAL BUCKLE LE     Left   UPPER EXTREMITY VENOGRAPHY Left 07/17/2022   Procedure: UPPER EXTREMITY VENOGRAPHY;  Surgeon: Sheree Penne Bruckner, MD;  Location: [facility] INVASIVE CV LAB;  Service: Cardiovascular;  Laterality: Left;    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 disease 12/12/2021   Chest pain 12/07/2021   CHF (congestive heart failure) (HCC)    Chronic constipation    Chronic kidney disease, stage 3, mod decreased GFR (HCC)    Followed by [facility]   Constipation    Depression    when I had Covid   GERD (gastroesophageal reflux disease) 12/16/2021   Gout    Headache    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  Facility-Administered Medications Prior to Admission  Medication Dose Route Frequency Provider Last Rate Last Admin   0.9 %  sodium chloride  infusion  250 mL Intravenous PRN Sheree Penne Bruckner, MD       sodium chloride  flush (NS) 0.9 % injection 3 mL  3 mL Intravenous Q12H Sheree Penne Bruckner, MD       Medications Prior to Admission  Medication Sig Dispense Refill   albuterol  (VENTOLIN  HFA) 108 (90 Base) MCG/ACT inhaler Inhale 1-2 puffs into the lungs every 6 (six) hours as needed for wheezing (cough). 8 g 2   allopurinol  (ZYLOPRIM ) 300 MG tablet Take 0.5 tablets (150 mg total) by mouth every dialysis (after dialysis). 15 tablet 0   benzonatate  (TESSALON ) 100 MG capsule Take 1 capsule (100 mg total) by mouth 3 (three) times daily as needed. 20 capsule 0   carvedilol  (COREG ) 25 MG tablet TAKE 1 TABLET(25 MG) BY MOUTH TWICE DAILY WITH A MEAL (Patient taking differently: Take 25 mg by mouth daily.) 60 tablet 3   colchicine  0.6 MG tablet Take 0.6 mg by mouth daily.     dorzolamide -timolol  (COSOPT ) 22.3-6.8 MG/ML ophthalmic solution Place 1 drop into the left eye 2 (two) times daily.     Doxercalciferol (HECTOROL IV) as directed. On dialysis days     famotidine  (PEPCID ) 20 MG tablet Take 1 tablet (20 mg total) by mouth 2 (two) times daily. 60 tablet 0   ipratropium (ATROVENT ) 0.03 % nasal spray Place 2 sprays into both nostrils every 12 (twelve) hours. (Patient taking differently: Place 2 sprays into both nostrils 2 (two) times daily as needed for rhinitis.) 30 mL 12   isosorbide   mononitrate (IMDUR ) 60 MG 24 hr tablet Take 60 mg by mouth daily.     latanoprost  (XALATAN ) 0.005 % ophthalmic solution Place 1 drop into the left eye at bedtime.     lidocaine -prilocaine (EMLA) cream Apply 1 Application topically daily as needed (Dialysis days).     LINZESS  145 MCG CAPS capsule TAKE 1 CAPSULE(145 MCG) BY MOUTH DAILY BEFORE BREAKFAST. GIVEN** (Patient not taking: Reported on 10/25/2022) 90 capsule 3   loperamide  (IMODIUM  A-D) 2 MG tablet Take 1 tablet (2 mg total) by mouth 4 (four) times daily as needed for diarrhea or loose stools. 30 tablet 0   ondansetron  (ZOFRAN -ODT) 4 MG disintegrating tablet Take 1 tablet (4 mg total) by mouth every 8 (eight) hours as needed. 20 tablet 0   oxyCODONE -acetaminophen  (PERCOCET/ROXICET)  5-325 MG tablet Take 1 tablet by mouth every 6 (six) hours as needed. 30 tablet 0   predniSONE  (DELTASONE ) 10 MG tablet Directions for 6 day taper: Day 1: 2 tablets before breakfast, 1 after both lunch & dinner and 2 at bedtime Day 2: 1 tab before breakfast, 1 after both lunch & dinner and 2 at bedtime Day 3: 1 tab at each meal & 1 at bedtime Day 4: 1 tab at breakfast, 1 at lunch, 1 at bedtime Day 5: 1 tab at breakfast & 1 tab at bedtime Day 6: 1 tab at breakfast 21 tablet 0   promethazine -dextromethorphan  (PROMETHAZINE -DM) 6.25-15 MG/5ML syrup Take 5 mLs by mouth 4 (four) times daily as needed for cough. 118 mL 0   sevelamer carbonate (RENVELA) 800 MG tablet Take 800 mg by mouth 3 (three) times daily with meals.     XPHOZAH 30 MG TABS Take 30 mg by mouth 2 (two) times daily.      ALLERGIES:  No Known Allergies  FAM HX: Family History  Problem Relation Age of Onset   Hypertension Mother    Hyperlipidemia Mother    Heart disease Mother    Kidney disease Mother    Depression Mother    Stomach cancer Father    Healthy Sister        x2   Healthy Brother        x2   Hypertension Maternal Grandmother    Stroke Maternal Grandmother    Heart disease Maternal  Grandmother    Hyperlipidemia Maternal Grandmother    Stroke Maternal Grandfather    Hypertension Maternal Grandfather    Heart disease Maternal Grandfather    Hyperlipidemia Maternal Grandfather    Alzheimer's disease Maternal Grandfather    Cancer - Other Paternal Grandmother    Healthy Daughter        x1   Healthy Son        x2   Diabetes Neg Hx    Heart attack Neg Hx    Sudden death Neg Hx    Colon cancer Neg Hx    Esophageal cancer Neg Hx    Pancreatic cancer Neg Hx    Inflammatory bowel disease Neg Hx    Liver disease Neg Hx    Rectal cancer Neg Hx     Social History:   reports that he has never smoked. He has never used smokeless tobacco. He reports that he does not currently use alcohol. He reports that he does not use drugs.  ROS: 12 system ROS neg except per HPI above  Blood pressure (!) 134/95, pulse 82, resp. rate 16, weight (!) 152.9 kg, SpO2 96%. PHYSICAL EXAM: Gen: obese, well appearing  Neck: RIJ TDC dressing c/d/i CV:  RRR Lungs: clear Extr:  LUE AVG +t/b, feels tortuous   No results found for this or any previous visit (from the past 48 hours).  No results found.  Assessment/Plan Frank Coffey is an 43 y.o. male with ESRD on HD, HTN, HL, obesity, OSA, anemia, CHF currently presenting for dialysis catheter exchange due to hole in the catheter.    **ESRD with dialysis access malfunction: hold in [facility] which can be a nidus for infection or lead to bleeding complications.  Exchange indicated, patient agreeable.   Plan local anesthesia but if needed can give IV sedation for patient comfort.  Manuelita DELENA Barters 01/23/2023, 12:40 PM

## 2023-01-24 ENCOUNTER — Encounter (HOSPITAL_COMMUNITY): Payer: Self-pay | Admitting: Internal Medicine

## 2023-01-24 ENCOUNTER — Ambulatory Visit: Payer: Managed Care, Other (non HMO) | Admitting: Physician Assistant

## 2023-01-24 VITALS — BP 164/103 | HR 89 | Temp 98.0°F | Resp 24 | Ht 68.0 in | Wt 349.5 lb

## 2023-01-24 DIAGNOSIS — N186 End stage renal disease: Secondary | ICD-10-CM

## 2023-01-24 NOTE — Progress Notes (Signed)
 POST OPERATIVE OFFICE NOTE    CC:  F/u for surgery  HPI:  This is a 43 y.o. male who is s/p revision of left arm cephalic vein fistula with transposition by Dr. Sheree on 12/22/2022.  Initial fistula creation was in August of this year.  He denies any signs or symptoms of steal syndrome in his left hand.  He believes the incisions have completely healed in his arm.  He recently had his right IJ TDC exchanged.  He dialyzes at the industrial [facility] location on a Tuesday Thursday Saturday schedule via right IJ TDC.  No Known Allergies  Current Outpatient Medications  Medication Sig Dispense Refill   albuterol  (VENTOLIN  HFA) 108 (90 Base) MCG/ACT inhaler Inhale 1-2 puffs into the lungs every 6 (six) hours as needed for wheezing (cough). 8 g 2   allopurinol  (ZYLOPRIM ) 300 MG tablet Take 0.5 tablets (150 mg total) by mouth every dialysis (after dialysis). 15 tablet 0   benzonatate  (TESSALON ) 100 MG capsule Take 1 capsule (100 mg total) by mouth 3 (three) times daily as needed. 20 capsule 0   carvedilol  (COREG ) 25 MG tablet TAKE 1 TABLET(25 MG) BY MOUTH TWICE DAILY WITH A MEAL (Patient taking differently: Take 25 mg by mouth daily.) 60 tablet 3   colchicine  0.6 MG tablet Take 0.6 mg by mouth daily.     dorzolamide -timolol  (COSOPT ) 22.3-6.8 MG/ML ophthalmic solution Place 1 drop into the left eye 2 (two) times daily.     Doxercalciferol (HECTOROL IV) as directed. On dialysis days     famotidine  (PEPCID ) 20 MG tablet Take 1 tablet (20 mg total) by mouth 2 (two) times daily. 60 tablet 0   ipratropium (ATROVENT ) 0.03 % nasal spray Place 2 sprays into both nostrils every 12 (twelve) hours. (Patient taking differently: Place 2 sprays into both nostrils 2 (two) times daily as needed for rhinitis.) 30 mL 12   isosorbide  mononitrate (IMDUR ) 60 MG 24 hr tablet Take 60 mg by mouth daily.     latanoprost  (XALATAN ) 0.005 % ophthalmic solution Place 1 drop into the left eye at bedtime.     lidocaine -prilocaine  (EMLA) cream Apply 1 Application topically daily as needed (Dialysis days).     LINZESS  145 MCG CAPS capsule TAKE 1 CAPSULE(145 MCG) BY MOUTH DAILY BEFORE BREAKFAST. GIVEN** 90 capsule 3   loperamide  (IMODIUM  A-D) 2 MG tablet Take 1 tablet (2 mg total) by mouth 4 (four) times daily as needed for diarrhea or loose stools. 30 tablet 0   ondansetron  (ZOFRAN -ODT) 4 MG disintegrating tablet Take 1 tablet (4 mg total) by mouth every 8 (eight) hours as needed. 20 tablet 0   oxyCODONE -acetaminophen  (PERCOCET/ROXICET) 5-325 MG tablet Take 1 tablet by mouth every 6 (six) hours as needed. 30 tablet 0   predniSONE  (DELTASONE ) 10 MG tablet Directions for 6 day taper: Day 1: 2 tablets before breakfast, 1 after both lunch & dinner and 2 at bedtime Day 2: 1 tab before breakfast, 1 after both lunch & dinner and 2 at bedtime Day 3: 1 tab at each meal & 1 at bedtime Day 4: 1 tab at breakfast, 1 at lunch, 1 at bedtime Day 5: 1 tab at breakfast & 1 tab at bedtime Day 6: 1 tab at breakfast 21 tablet 0   promethazine -dextromethorphan  (PROMETHAZINE -DM) 6.25-15 MG/5ML syrup Take 5 mLs by mouth 4 (four) times daily as needed for cough. 118 mL 0   sevelamer carbonate (RENVELA) 800 MG tablet Take 800 mg by mouth 3 (three) times  daily with meals.     XPHOZAH 30 MG TABS Take 30 mg by mouth 2 (two) times daily.     Current Facility-Administered Medications  Medication Dose Route Frequency Provider Last Rate Last Admin   0.9 %  sodium chloride  infusion  250 mL Intravenous PRN Sheree Penne Bruckner, MD       sodium chloride  flush (NS) 0.9 % injection 3 mL  3 mL Intravenous Q12H Sheree Penne Bruckner, MD         ROS:  See HPI  Physical Exam:  Vitals:   01/24/23 1424  BP: (!) 164/103  Pulse: 89  Resp: (!) 24  Temp: 98 F (36.7 C)  TempSrc: Temporal  SpO2: 94%  Weight: (!) 349 lb 8 oz (158.5 kg)  Height: 5' 8 (1.727 m)    Incision:  L arm incisions healed Extremities: Palpable thrill through the fistula in  the upper arm; palpable left radial pulse Neuro: A&O  Assessment/Plan:  This is a 43 y.o. male who is s/p: Cephalic vein fistula revision with transposition  Left brachiocephalic fistula is patent without any signs or symptoms of steal syndrome in the left hand.  There is a palpable thrill throughout the upper arm.  The incisions have completely healed.  Ok to begin cannulized and left arm fistula on Tuesday, 01/30/2023.  The [facility] can be removed when nephrology is comfortable with the performance of the fistula.  He will follow-up on an as-needed basis.   Donnice Sender, PA-C [facility] [phone]  Clinic MD:  Sheree

## 2023-02-25 ENCOUNTER — Telehealth: Payer: Managed Care, Other (non HMO) | Admitting: Family

## 2023-02-25 DIAGNOSIS — J208 Acute bronchitis due to other specified organisms: Secondary | ICD-10-CM | POA: Diagnosis not present

## 2023-02-25 DIAGNOSIS — B9689 Other specified bacterial agents as the cause of diseases classified elsewhere: Secondary | ICD-10-CM

## 2023-02-25 MED ORDER — AZITHROMYCIN 250 MG PO TABS: ORAL_TABLET | ORAL | 0 refills | Status: DC

## 2023-02-25 MED ORDER — PREDNISONE 10 MG (21) PO TBPK: ORAL_TABLET | ORAL | 0 refills | Status: DC

## 2023-02-25 MED ORDER — BENZONATATE 100 MG PO CAPS: 100.0000 mg | ORAL_CAPSULE | Freq: Three times a day (TID) | ORAL | 0 refills | Status: DC | PRN

## 2023-02-25 NOTE — Progress Notes (Signed)
 E-Visit for Cough   We are sorry that you are not feeling well.  Here is how we plan to help!  Based on your presentation I believe you most likely have A cough due to bacteria.  When patients have a fever and a productive cough with a change in color or increased sputum production, we are concerned about bacterial bronchitis.  If left untreated it can progress to pneumonia.  If your symptoms do not improve with your treatment plan it is important that you contact your provider.   I have prescribed Azithromyin 250 mg: two tablets now and then one tablet daily for 4 additonal days    In addition you may use A non-prescription cough medication called Robitussin DAC. Take 2 teaspoons every 8 hours or Delsym : take 2 teaspoons every 12 hours., A non-prescription cough medication called Mucinex  DM: take 2 tablets every 12 hours., and A prescription cough medication called Tessalon  Perles 100mg . You may take 1-2 capsules every 8 hours as needed for your cough.  Prednisone  10 mg daily for 6 days (see taper instructions below)  From your responses in the eVisit questionnaire you describe inflammation in the upper respiratory tract which is causing a significant cough.  This is commonly called Bronchitis and has four common causes:   Allergies Viral Infections Acid Reflux Bacterial Infection Allergies, viruses and acid reflux are treated by controlling symptoms or eliminating the cause. An example might be a cough caused by taking certain blood pressure medications. You stop the cough by changing the medication. Another example might be a cough caused by acid reflux. Controlling the reflux helps control the cough.  USE OF BRONCHODILATOR (RESCUE) INHALERS: There is a risk from using your bronchodilator too frequently.  The risk is that over-reliance on a medication which only relaxes the muscles surrounding the breathing tubes can reduce the effectiveness of medications prescribed to reduce swelling and  congestion of the tubes themselves.  Although you feel brief relief from the bronchodilator inhaler, your asthma may actually be worsening with the tubes becoming more swollen and filled with mucus.  This can delay other crucial treatments, such as oral steroid medications. If you need to use a bronchodilator inhaler daily, several times per day, you should discuss this with your provider.  There are probably better treatments that could be used to keep your asthma under control.     HOME CARE Only take medications as instructed by your medical team. Complete the entire course of an antibiotic. Drink plenty of fluids and get plenty of rest. Avoid close contacts especially the very young and the elderly Cover your mouth if you cough or cough into your sleeve. Always remember to wash your hands A steam or ultrasonic humidifier can help congestion.   GET HELP RIGHT AWAY IF: You develop worsening fever. You become short of breath You cough up blood. Your symptoms persist after you have completed your treatment plan MAKE SURE YOU  Understand these instructions. Will watch your condition. Will get help right away if you are not doing well or get worse.    Thank you for choosing an e-visit.  Your e-visit answers were reviewed by a board certified advanced clinical practitioner to complete your personal care plan. Depending upon the condition, your plan could have included both over the counter or prescription medications.  Please review your pharmacy choice. Make sure the pharmacy is open so you can pick up prescription now. If there is a problem, you may contact your provider  through [employer] and have the prescription routed to another pharmacy.  Your safety is important to us . If you have drug allergies check your prescription carefully.   For the next 24 hours you can use MyChart to ask questions about today's visit, request a non-urgent call back, or ask for a work or school  excuse. You will get an email in the next two days asking about your experience. I hope that your e-visit has been valuable and will speed your recovery.   Approximately 5 minutes was spent documenting and reviewing patient's chart.

## 2023-03-20 ENCOUNTER — Telehealth: Admitting: Physician Assistant

## 2023-03-20 DIAGNOSIS — J111 Influenza due to unidentified influenza virus with other respiratory manifestations: Secondary | ICD-10-CM | POA: Diagnosis not present

## 2023-03-20 MED ORDER — OSELTAMIVIR PHOSPHATE 30 MG PO CAPS: ORAL_CAPSULE | ORAL | 0 refills | Status: DC

## 2023-03-20 NOTE — Progress Notes (Signed)
 Message sent to patient requesting further input regarding current symptoms. Awaiting patient response.

## 2023-03-20 NOTE — Progress Notes (Signed)
 E visit for Flu like symptoms   We are sorry that you are not feeling well.  Here is how we plan to help! Based on what you have shared with me it looks like you may have influenza.  Influenza or "the flu" is   an infection caused by a respiratory virus. The flu virus is highly contagious and persons who did not receive their yearly flu vaccination may "catch" the flu from close contact.  We have anti-viral medications to treat the viruses that cause this infection. They are not a "cure" and only shorten the course of the infection. These prescriptions are most effective when they are given within the first 2 days of "flu" symptoms. Antiviral medication are indicated if you have a high risk of complications from the flu. You should  also consider an antiviral medication if you are in close contact with someone who is at risk. These medications can help patients avoid complications from the flu  but have side effects that you should know. Possible side effects from Tamiflu or oseltamivir include nausea, vomiting, diarrhea, dizziness, headaches, eye redness, sleep problems or other respiratory symptoms. You should not take Tamiflu if you have an allergy to oseltamivir or any to the ingredients in Tamiflu.  Based upon your symptoms and potential risk factors I have prescribed Oseltamivir (Tamiflu).  It has been sent to your designated pharmacy.  I have dosed this based on your kidney function and hemodialysis schedule. You will take one 30 mg capsule immediately and then one 30 mg capsule after every dialysis session in the next 5 days. and I recommend that you follow the flu symptoms recommendation that I have listed below.  Please keep well-hydrated and try to get plenty of rest. If you have a humidifier, place it in the bedroom and run it at night. Start a saline nasal rinse for nasal congestion. You can consider use of a nasal steroid spray like Flonase or Nasacort OTC. You can alternate between  Tylenol and Ibuprofen if needed for fever, body aches, headache and/or throat pain. Salt water-gargles and chloraseptic spray can be very beneficial for sore throat. Please take all prescribed medications as directed.  Remain out of work until [employer] for 24 hours without a fever-reducing medication, and you are feeling better.  You should mask until symptoms are resolved.  If anything worsens despite treatment, you need to be evaluated in-person. Please do not delay care.   ANYONE WHO HAS FLU SYMPTOMS SHOULD: Stay home. The flu is highly contagious and going out or to work exposes others! Be sure to drink plenty of fluids. Water is fine as well as fruit juices, sodas and electrolyte beverages. You may want to stay away from caffeine or alcohol. If you are nauseated, try taking small sips of liquids. How do you know if you are getting enough fluid? Your urine should be a pale yellow or almost colorless. Get rest. Taking a steamy shower or using a humidifier may help nasal congestion and ease sore throat pain. Using a saline nasal spray works much the same way. Cough drops, hard candies and sore throat lozenges may ease your cough. Line up a caregiver. Have someone check on you regularly.   GET HELP RIGHT AWAY IF: You cannot keep down liquids or your medications. You become short of breath Your fell like you are going to pass out or loose consciousness. Your symptoms persist after you have completed your treatment plan MAKE SURE YOU  Understand these instructions.  Will watch your condition. Will get help right away if you are not doing well or get worse.  Your e-visit answers were reviewed by a board certified advanced clinical practitioner to complete your personal care plan.  Depending on the condition, your plan could have included both over the counter or prescription medications.  If there is a problem please reply  once you have received a response from your provider.  Your  safety is important to [place].  If you have drug allergies check your prescription carefully.    You can use MyChart to ask questions about today's visit, request a non-urgent call back, or ask for a work or school excuse for 24 hours related to this e-Visit. If it has been greater than 24 hours you will need to follow up with your provider, or enter a new e-Visit to address those concerns.  You will get an e-mail in the next two days asking about your experience.  I hope that your e-visit has been valuable and will speed your recovery. Thank you for using e-visits.

## 2023-03-20 NOTE — Progress Notes (Signed)
 I have spent 5 minutes in review of e-visit questionnaire, review and updating patient chart, medical decision making and response to patient.   Piedad Climes, PA-C

## 2023-04-05 ENCOUNTER — Telehealth

## 2023-04-05 DIAGNOSIS — R112 Nausea with vomiting, unspecified: Secondary | ICD-10-CM

## 2023-04-06 NOTE — Progress Notes (Signed)
   Thank you for the details you included in the comment boxes. Those details are very helpful in determining the best course of treatment for you and help [place] to provide the best care. We recommend that you schedule a Virtual Urgent Care video visit in order for the provider to better assess what is going on.  The provider will be able to give you a more accurate diagnosis and treatment plan if we can more freely discuss your symptoms and with the addition of a virtual examination.   If you change your visit to a video visit, we will bill your insurance (similar to an office visit) and you will not be charged for this e-Visit. You will be able to stay at home and speak with the first available [facility] advanced practice provider. The link to do a video visit is in the drop down Menu tab of your Welcome screen in MyChart.       I have spent 5 minutes in review of e-visit questionnaire, review and updating patient chart, medical decision making and response to patient.   Margaretann Loveless, PA-C

## 2023-04-10 ENCOUNTER — Other Ambulatory Visit: Payer: Self-pay

## 2023-04-10 ENCOUNTER — Emergency Department (HOSPITAL_BASED_OUTPATIENT_CLINIC_OR_DEPARTMENT_OTHER)
Admission: EM | Admit: 2023-04-10 | Discharge: 2023-04-10 | Disposition: A | Discharge status: Home / Self Care | Attending: Emergency Medicine | Admitting: Emergency Medicine

## 2023-04-10 ENCOUNTER — Encounter (HOSPITAL_BASED_OUTPATIENT_CLINIC_OR_DEPARTMENT_OTHER): Payer: Self-pay

## 2023-04-10 DIAGNOSIS — M79605 Pain in left leg: Secondary | ICD-10-CM | POA: Diagnosis not present

## 2023-04-10 DIAGNOSIS — Z79899 Other long term (current) drug therapy: Secondary | ICD-10-CM | POA: Diagnosis not present

## 2023-04-10 DIAGNOSIS — R059 Cough, unspecified: Secondary | ICD-10-CM | POA: Diagnosis not present

## 2023-04-10 DIAGNOSIS — M25569 Pain in unspecified knee: Secondary | ICD-10-CM | POA: Diagnosis present

## 2023-04-10 MED ORDER — AZITHROMYCIN 250 MG PO TABS: 250.0000 mg | ORAL_TABLET | Freq: Every day | ORAL | 0 refills | Status: DC

## 2023-04-10 MED ORDER — DICLOFENAC SODIUM 1 % EX GEL: 4.0000 g | Freq: Four times a day (QID) | CUTANEOUS | 0 refills | Status: DC

## 2023-04-10 NOTE — Discharge Instructions (Signed)
 Try not to eat or drink anything including water up to 4 hours before you go to bed.  Try to avoid things that may make this worse, most commonly these are spicy foods tomato based products fatty foods chocolate and peppermint.  Alcohol and tobacco can also make this worse.  Return to the emergency department for sudden worsening pain fever or inability to eat or drink.  Im not sure the cause of your leg pain.  Please discuss your visit here with your family doctor.  I prescribed you a gel that hopefully will help you with your discomfort.

## 2023-04-10 NOTE — ED Provider Notes (Signed)
 [place] EMERGENCY DEPARTMENT AT [facility] Provider Note   CSN: [id number] Arrival date & time: 04/10/23  1747     History  Chief Complaint  Patient presents with   Knee Pain   Ankle Pain    Frank Coffey is a 43 y.o. male.  43 yoM with 2 separate complaints.  Tells me that he is primarily here for left leg pain.  Has been going on for about a week or so.  He thought maybe it was due to gout.  Seems to be worse when he sits or lays down.  Improves when he gets up and walks.  He says it starts in the front of his upper leg and then feels it diffusely from there down.  He had some blood test done at his dialysis unit and they told him his uric acid level was normal.  He denies trauma to the area denies back pain denies loss of bowel or bladder denies loss of perirectal sensation.   Knee Pain Ankle Pain      Home Medications Prior to Admission medications   Medication Sig Start Date End Date Taking? Authorizing Provider  azithromycin (ZITHROMAX) 250 MG tablet Take 1 tablet (250 mg total) by mouth daily. Take first 2 tablets together, then 1 every day until finished. 04/10/23  Yes Melene Plan, DO  diclofenac Sodium (VOLTAREN) 1 % GEL Apply 4 g topically 4 (four) times daily. 04/10/23  Yes Melene Plan, DO  albuterol (VENTOLIN HFA) 108 (90 Base) MCG/ACT inhaler Inhale 1-2 puffs into the lungs every 6 (six) hours as needed for wheezing (cough). 06/09/21   Mliss Sax, MD  allopurinol (ZYLOPRIM) 300 MG tablet Take 0.5 tablets (150 mg total) by mouth every dialysis (after dialysis). 05/27/22   Claiborne Rigg, NP  benzonatate (TESSALON PERLES) 100 MG capsule Take 1 capsule (100 mg total) by mouth 3 (three) times daily as needed. 02/25/23   Jannifer Rodney A, FNP  carvedilol (COREG) 25 MG tablet TAKE 1 TABLET(25 MG) BY MOUTH TWICE DAILY WITH A MEAL Patient taking differently: Take 25 mg by mouth daily. 05/25/22   Chandrasekhar, Lafayette Dragon A, MD  colchicine 0.6 MG tablet Take 0.6  mg by mouth daily. 07/07/22   [provider]  dorzolamide-timolol (COSOPT) 22.3-6.8 MG/ML ophthalmic solution Place 1 drop into the left eye 2 (two) times daily. 03/10/19   [provider]  famotidine (PEPCID) 20 MG tablet Take 1 tablet (20 mg total) by mouth 2 (two) times daily. 12/23/22   Claiborne Rigg, NP  ipratropium (ATROVENT) 0.03 % nasal spray Place 2 sprays into both nostrils every 12 (twelve) hours. Patient taking differently: Place 2 sprays into both nostrils 2 (two) times daily as needed for rhinitis. 04/07/22   Viviano Simas, FNP  isosorbide mononitrate (IMDUR) 60 MG 24 hr tablet Take 60 mg by mouth daily.    [provider]  latanoprost (XALATAN) 0.005 % ophthalmic solution Place 1 drop into the left eye at bedtime. 04/14/19   [provider]  lidocaine-prilocaine (EMLA) cream Apply 1 Application topically daily as needed (Dialysis days). 02/21/22   [provider]  LINZESS 145 MCG CAPS capsule TAKE 1 CAPSULE(145 MCG) BY MOUTH DAILY BEFORE BREAKFAST. GIVEN** 10/31/21   Nche, Bonna Gains, NP  loperamide (IMODIUM A-D) 2 MG tablet Take 1 tablet (2 mg total) by mouth 4 (four) times daily as needed for diarrhea or loose stools. 01/12/23   Margaretann Loveless, PA-C  ondansetron (ZOFRAN-ODT) 4 MG disintegrating  tablet Take 1 tablet (4 mg total) by mouth every 8 (eight) hours as needed. 01/12/23   Margaretann Loveless, PA-C  oseltamivir (TAMIFLU) 30 MG capsule Take 1 capsule immediately. Then take one capsule after each dialysis session over the next 5 days. 03/20/23   Waldon Merl, PA-C  oxyCODONE-acetaminophen (PERCOCET/ROXICET) 5-325 MG tablet Take 1 tablet by mouth every 6 (six) hours as needed. 12/22/22   Lars Mage, PA-C  predniSONE (STERAPRED UNI-PAK 21 TAB) 10 MG (21) TBPK tablet Use as directed 02/25/23   Jannifer Rodney A, FNP  promethazine-dextromethorphan (PROMETHAZINE-DM) 6.25-15 MG/5ML syrup Take 5 mLs by mouth 4 (four) times daily as  needed for cough. 11/13/22   Ward, Tylene Fantasia, PA-C  sevelamer carbonate (RENVELA) 800 MG tablet Take 800 mg by mouth 3 (three) times daily with meals. 05/23/22   [provider]  XPHOZAH 30 MG TABS Take 30 mg by mouth 2 (two) times daily. 06/19/22   [provider]      Allergies    Patient has no known allergies.    Review of Systems   Review of Systems  Physical Exam Updated Vital Signs BP 132/89 (BP Location: Right Arm)   Pulse (!) 124   Temp 98.8 F (37.1 C)   Resp 15   Ht 5\' 8"  (1.727 m)   Wt (!) 155.1 kg   SpO2 92%   BMI 52.00 kg/m  Physical Exam Vitals and nursing note reviewed.  Constitutional:      Appearance: He is well-developed.  HENT:     Head: Normocephalic and atraumatic.  Eyes:     Pupils: Pupils are equal, round, and reactive to light.  Neck:     Vascular: No JVD.  Cardiovascular:     Rate and Rhythm: Normal rate and regular rhythm.     Heart sounds: No murmur heard.    No friction rub. No gallop.  Pulmonary:     Effort: No respiratory distress.     Breath sounds: No wheezing.  Abdominal:     General: There is no distension.     Tenderness: There is no abdominal tenderness. There is no guarding or rebound.  Musculoskeletal:        General: Normal range of motion.     Cervical back: Normal range of motion and neck supple.     Comments: Pulse motor and sensation intact the left lower extremity.  I do not appreciate an obvious rash.  No obvious pain at the ankle or the knee.  I am able to range them without discomfort.  He has some mild pain with compression of the quadriceps muscle.  Skin:    Coloration: Skin is not pale.     Findings: No rash.  Neurological:     Mental Status: He is alert and oriented to person, place, and time.  Psychiatric:        Behavior: Behavior normal.     ED Results / Procedures / Treatments   Labs (all labs ordered are listed, but only abnormal results are displayed) Labs Reviewed - No data to  display  EKG None  Radiology No results found.  Procedures Procedures    Medications Ordered in ED Medications - No data to display  ED Course/ Medical Decision Making/ A&P                                 Medical Decision Making Risk Prescription drug  management.   43 yo M with a chief complaints of left leg pain.  This is likely radicular pain though he denies back pain.  He has no obvious weakness or numbness on exam.  Pulse intact.  No rash.  Will treat as possible muscular pain.  Diclofenac gel.  PCP follow-up.  Patient has a second complaint of about cough this been going on for a few weeks now.  He is also been vomiting usually in the middle the night wakes up and feels like he has indigestion and throws up.  I think the patient likely has reflux disease.  Seems to be worse than he typically does and not controlled by his medications.  I discussed dietary modification.  PCP follow-up.  Patient thinks that this may be due to an upper respiratory illness.  Will trial a azithromycin for possible pertussis.  7:02 PM:  I have discussed the diagnosis/risks/treatment options with the patient.  Evaluation and diagnostic testing in the emergency department does not suggest an emergent condition requiring admission or immediate intervention beyond what has been performed at this time.  They will follow up with PCP. We also discussed returning to the ED immediately if new or worsening sx occur. We discussed the sx which are most concerning (e.g., sudden worsening pain, fever, inability to tolerate by mouth) that necessitate immediate return. Medications administered to the patient during their visit and any new prescriptions provided to the patient are listed below.  Medications given during this visit Medications - No data to display   The patient appears reasonably screen and/or stabilized for discharge and I doubt any other medical condition or other [facility] requiring further screening,  evaluation, or treatment in the ED at this time prior to discharge.          Final Clinical Impression(s) / ED Diagnoses Final diagnoses:  Pain of left lower extremity    Rx / DC Orders ED Discharge Orders          Ordered    diclofenac Sodium (VOLTAREN) 1 % GEL  4 times daily        04/10/23 1853    azithromycin (ZITHROMAX) 250 MG tablet  Daily        04/10/23 1853              Melene Plan, DO 04/10/23 1903

## 2023-04-10 NOTE — ED Triage Notes (Signed)
 States pain to left knee and left ankle.  Pain down side of leg.  States feels like his gout pain.  Ambulatory to triage with slight limp

## 2023-04-13 ENCOUNTER — Telehealth: Payer: Self-pay

## 2023-04-13 NOTE — Transitions of Care (Post Inpatient/ED Visit) (Signed)
   04/13/2023  Name: BONNY EGGER MRN: [id number] DOB: 08/04/1980  Today's TOC FU Call Status: Today's TOC FU Call Status:: Unsuccessful Call (1st Attempt) Unsuccessful Call (1st Attempt) Date: 04/13/23  Attempted to reach the patient regarding the most recent Inpatient/ED visit.  Follow Up Plan: Additional outreach attempts will be made to reach the patient to complete the Transitions of Care (Post Inpatient/ED visit) call.   Signature  Jenny Reichmann

## 2023-04-18 ENCOUNTER — Ambulatory Visit: Payer: Self-pay

## 2023-04-18 ENCOUNTER — Ambulatory Visit (INDEPENDENT_AMBULATORY_CARE_PROVIDER_SITE_OTHER): Admitting: Family Medicine

## 2023-04-18 ENCOUNTER — Encounter: Payer: Self-pay | Admitting: Family Medicine

## 2023-04-18 ENCOUNTER — Telehealth: Payer: Self-pay | Admitting: Nurse Practitioner

## 2023-04-18 VITALS — BP 132/90 | HR 104 | Temp 97.7°F | Ht 68.5 in | Wt 342.2 lb

## 2023-04-18 DIAGNOSIS — R053 Chronic cough: Secondary | ICD-10-CM

## 2023-04-18 DIAGNOSIS — M7062 Trochanteric bursitis, left hip: Secondary | ICD-10-CM

## 2023-04-18 DIAGNOSIS — M7632 Iliotibial band syndrome, left leg: Secondary | ICD-10-CM

## 2023-04-18 MED ORDER — METHYLPREDNISOLONE 4 MG PO TBPK: ORAL_TABLET | ORAL | 0 refills | Status: AC

## 2023-04-18 NOTE — Patient Instructions (Addendum)
  I suspect that your left leg pain is due to inflammation of the IT band, and possibly hip bursitis. Options to treat include the Voltaren gel (as suggested by the ER), and stretches. Physical therapy might also be beneficial. If this isn't improving right away, and pain is severe, start the course of steroids. Be sure to take the steroids with food. Avoid ibuprofen. You may continue to take tylenol, if needed.  Use heat to the area at least 2x/day, prior to stretching. Use ice after activities. Do the stretch as shown, after the heat, at least 2x/day.  Contact your PCP office for referral to PT if not improving with these measures.   Follow up with your regular doctor if cough doesn't improve. Cough that is worse when laying down can be related to postnasal drainage (cold or allergies) or from reflux.  Discuss the possible toenail fungus with your PCP. If you are having recurrent ingrowing nails, it is worth discussing treatment.

## 2023-04-18 NOTE — Progress Notes (Signed)
 Chief Complaint  Patient presents with   Leg Pain    Left leg pain. Ok when he is up ad walking. Worse when sitting or laying down. Also when he straightens his leg out. He thought it was gout flare,  labs were run and it was not. Colchicine not working, takes allopurinol daily.    Patient presents for evaluation of L leg pain, ER follow-up. He had trouble sleeping last night due to pain. He wasn't able to get in with PCP, so scheduled for acute visit at our office.  ER visit 04/10/23 for left leg pain x 1 wk, thought d/t gout, so started on colchicine. They thought possible radicular pain, but no back pain. Normal exam (no weakness or numbness). He was supposed to use diclofenac gel.  At ER they also discussed cough--for a few weeks. They reported some vomiting in the middle of the night, wakes up with indigestion and vomits.  He takes meds for this, not helping. He was advised to f/u with his PCP. There was also concern for possible respiratory illness, and the ER prescribed azithromycin for possible pertussis. He reports still coughing and reflux.  Today he describes his LLE pain as an aching pain, keeps him up. Feels like "the inflammation before gout". An "inflammatory" feeling, throbbing, ache.  No burning, no numbness or tingling. Started at his Left hip, went down to the L knee, some pain at the lateral lower leg, and L ankle. Never saw any joint swelling or redness. Told uric acid level was normal at kidney doctor. (Dialysis). He denies any back pain. No numbness, tingling or weakness. Feels similar to pain he had down the leg from an injury, but also had back pain then, but not now.  He never picked up the voltaren gel (rx was expensive, didn't buy the OTC). Tylenol doesn't help. Was desperate and took ibuprofen last night. This didn't help. Sometimes elevating the leg helps a little, but didn't last night.  Sitting straight up helps, it hurts to lay down, especially on the left  side. Pain is mainly at the L hip. Denies knee pain with walking, has some pain with full extension. Knee feels better with moving.  Dialysis was yesterday. K was fine.  At end of visit he asked about toenail fungus. He denies athlete's foot or rash of the skin/between toes, just on the toenail, and h/o ingrowing nails.   PMH, PSH, SH reviewed  ESRD on dialysis CHF, NICM, OSA, gout, HLD, GERD  Outpatient Encounter Medications as of 04/18/2023  Medication Sig Note   allopurinol (ZYLOPRIM) 300 MG tablet Take 0.5 tablets (150 mg total) by mouth every dialysis (after dialysis).    colchicine 0.6 MG tablet Take 0.6 mg by mouth daily.    dorzolamide-timolol (COSOPT) 22.3-6.8 MG/ML ophthalmic solution Place 1 drop into the left eye 2 (two) times daily.    isosorbide mononitrate (IMDUR) 60 MG 24 hr tablet Take 60 mg by mouth daily.    latanoprost (XALATAN) 0.005 % ophthalmic solution Place 1 drop into the left eye at bedtime.    lidocaine-prilocaine (EMLA) cream Apply 1 Application topically daily as needed (Dialysis days).    LINZESS 145 MCG CAPS capsule TAKE 1 CAPSULE(145 MCG) BY MOUTH DAILY BEFORE BREAKFAST. GIVEN**    methylPREDNISolone (MEDROL DOSEPAK) 4 MG TBPK tablet Take as directed, with food    sevelamer carbonate (RENVELA) 800 MG tablet Take 800 mg by mouth 3 (three) times daily with meals.    XPHOZAH 30 MG  TABS Take 30 mg by mouth 2 (two) times daily.    albuterol (VENTOLIN HFA) 108 (90 Base) MCG/ACT inhaler Inhale 1-2 puffs into the lungs every 6 (six) hours as needed for wheezing (cough). (Patient not taking: Reported on 04/18/2023) 04/18/2023: As needed   carvedilol (COREG) 25 MG tablet TAKE 1 TABLET(25 MG) BY MOUTH TWICE DAILY WITH A MEAL (Patient not taking: Reported on 04/18/2023) 04/18/2023: 1/2 tablet after dialysis   diclofenac Sodium (VOLTAREN) 1 % GEL Apply 4 g topically 4 (four) times daily. (Patient not taking: Reported on 04/18/2023) 04/18/2023: Was told to get OTC, did not get    loperamide (IMODIUM A-D) 2 MG tablet Take 1 tablet (2 mg total) by mouth 4 (four) times daily as needed for diarrhea or loose stools. (Patient not taking: Reported on 04/18/2023)    [DISCONTINUED] azithromycin (ZITHROMAX) 250 MG tablet Take 1 tablet (250 mg total) by mouth daily. Take first 2 tablets together, then 1 every day until finished.    [DISCONTINUED] benzonatate (TESSALON PERLES) 100 MG capsule Take 1 capsule (100 mg total) by mouth 3 (three) times daily as needed.    [DISCONTINUED] famotidine (PEPCID) 20 MG tablet Take 1 tablet (20 mg total) by mouth 2 (two) times daily.    [DISCONTINUED] ipratropium (ATROVENT) 0.03 % nasal spray Place 2 sprays into both nostrils every 12 (twelve) hours. (Patient taking differently: Place 2 sprays into both nostrils 2 (two) times daily as needed for rhinitis.)    [DISCONTINUED] ondansetron (ZOFRAN-ODT) 4 MG disintegrating tablet Take 1 tablet (4 mg total) by mouth every 8 (eight) hours as needed.    [DISCONTINUED] oseltamivir (TAMIFLU) 30 MG capsule Take 1 capsule immediately. Then take one capsule after each dialysis session over the next 5 days.    [DISCONTINUED] oxyCODONE-acetaminophen (PERCOCET/ROXICET) 5-325 MG tablet Take 1 tablet by mouth every 6 (six) hours as needed.    [DISCONTINUED] predniSONE (STERAPRED UNI-PAK 21 TAB) 10 MG (21) TBPK tablet Use as directed    [DISCONTINUED] promethazine-dextromethorphan (PROMETHAZINE-DM) 6.25-15 MG/5ML syrup Take 5 mLs by mouth 4 (four) times daily as needed for cough.    Facility-Administered Encounter Medications as of 04/18/2023  Medication   0.9 %  sodium chloride infusion   sodium chloride flush (NS) 0.9 % injection 3 mL   NOT taking medrol prior to today's appointment.  ROS: no f/c.  Some congestion, some cough, +GERD. No abdominal pain, diarrhea. No joint swelling, redness, rash. LLE pain per HPI. H/o ingrowing toenails and fungal toenails. Denies pain currently.     PHYSICAL EXAM:  BP (!)  132/90   Pulse (!) 104   Ht 5' 8.5" (1.74 m)   Wt (!) 342 lb 3.2 oz (155.2 kg)   BMI 51.27 kg/m   T97.7 Wt Readings from Last 3 Encounters:  04/18/23 (!) 342 lb 3.2 oz (155.2 kg)  04/10/23 (!) 342 lb (155.1 kg)  01/24/23 (!) 349 lb 8 oz (158.5 kg)   Well-appearing, pleasant male, who appears comfortable.  Has moderate discomfort with stretches and certain positions. Occasional cough. No sniffling. Neck: no lymphadenopathy or mass Heart: mildly tachycardic, no murmur Lungs: clear bilaterally Back: no spinal or CVA tenderness, no muscle spasm Abdomen: obese, soft, NT Extremities: Very tender at L lateral hip at greater trochanter, tender all along the IT band, to the lateral knee. Also tender at patellar tendon. Had some discomfort at L upper calf posteriorly (mild). With crossing leg (piriformis stretch on left) had pain at the L hamstring (not in buttock). Pain  with ITB stretch on left    ASSESSMENT/PLAN:  It band syndrome, left - encouraged use of voltaren gel, ice/heat and stretches. Consider PT. Start medrol dosepak if not improving - Plan: methylPREDNISolone (MEDROL DOSEPAK) 4 MG TBPK tablet  Greater trochanteric bursitis of left hip - stretches, voltaren gel. Can consider injection if pain more localized to bursa. Consider PT. Medrol dosepak if not improving - Plan: methylPREDNISolone (MEDROL DOSEPAK) 4 MG TBPK tablet  Chronic cough - worse laying down, DDx discussed (PND, GERD). to f/u with PCP. info given  Advised to f/u with PCP re: chronic cough, and also for discussion of possible fungal toenails. Not appropriate for acute visit at another office.   I suspect that your left leg pain is due to inflammation of the IT band, and possibly hip bursitis. Options to treat include the Voltaren gel (as suggested by the ER), and stretches. Physical therapy might also be beneficial. If this isn't improving right away, and pain is severe, start the course of steroids. Be sure  to take the steroids with food. Avoid ibuprofen. You may continue to take tylenol, if needed.  Use heat to the area at least 2x/day. Do the stretch as shown, after the heat, at least 2x/day.  Contact your PCP office for referral to PT if not improving with these measures.

## 2023-04-18 NOTE — Telephone Encounter (Signed)
 Pt is scheduled to see Lavonda Jumbo, MD today @ 1:30pm for ED f/u

## 2023-04-18 NOTE — Telephone Encounter (Signed)
 Pt is asking about a HFU from his Rd visit. I offered 4/7 at 10:00. E2C2 was going to let him Know.

## 2023-04-18 NOTE — Telephone Encounter (Signed)
 If and/or when patient calls back he can be scheduled in an appropriate slot. The 04/723 time slot originally offered to patient is filled.

## 2023-04-18 NOTE — Telephone Encounter (Signed)
  Chief Complaint: left leg pain; cough Symptoms: left leg pain, nausea, productive cough, runny nose Frequency: leg x 2-3 weeks, cough x 3-4 weeks Pertinent Negatives: Patient denies back pain, fever, ear aches, sore throat,  Disposition: [] ED /[] Urgent Care (no appt availability in office) / [x] Appointment(In office/virtual)/ []  [facility]/ [] Home Care/ [] Refused Recommended Disposition /[] [facility]/ []  Follow-up with PCP Additional Notes: No availability with PCP for several weeks. No availability today with another provider at PCP office. Patient agreeable to be seen today at available location/provider. Patient states he was up all night due to the severity of his leg pain and he would like to be seen today and can not wait til next week to see his PCP.  Copied from CRM [id number]. Topic: Clinical - Pink Word Triage >> Apr 18, 2023  8:28 AM Adaysia C wrote: Reason for Triage: Patient is having severe aching pain in left leg; patient was seen in the ED on 04/05/2023 for this pain; patient is still having pain and has requested to speak to a nurse because he can't get a same day appointment with his PCP; patient warm transferred to nurse triage Reason for Disposition  [1] SEVERE pain (e.g., excruciating, unable to do any normal activities) AND [2] not improved after 2 hours of pain medicine  Answer Assessment - Initial Assessment Questions 1. ONSET: "When did the pain start?"      2-3 weeks.  2. LOCATION: "Where is the pain located?"      Left leg; from hip down to foot. He states sometimes it is knee down to foot.  3. PAIN: "How bad is the pain?"    (Scale 1-10; or mild, moderate, severe)   -  MILD (1-3): doesn't interfere with normal activities    -  MODERATE (4-7): interferes with normal activities (e.g., work or school) or awakens from sleep, limping    -  SEVERE (8-10): excruciating pain, unable to do any normal activities, unable to walk     7-8/10, he states  it keeps him up at night. He states it is not as bad when moving around, but worsens at night when lying down.  4. WORK OR EXERCISE: "Has there been any recent work or exercise that involved this part of the body?"      Denies.  5. CAUSE: "What do you think is causing the leg pain?"     He states he thought it was gout. He states he had dialysis check his labs for that and yesterday they told him the levels were normal and recommended he follow up with his PCP.  6. OTHER SYMPTOMS: "Do you have any other symptoms?" (e.g., chest pain, back pain, breathing difficulty, swelling, rash, fever, numbness, weakness)     Patient states he has also had a cough for almost a month, states it is deep. He states the coughing gets so severe it makes him throw up sometimes. He states he was on a steroid pack which improved but was too short. Chest pain after coughing fit. He states when he coughs up it looks like acid reflux/bile comes up.  7. PREGNANCY: "Is there any chance you are pregnant?" "When was your last menstrual period?"     N/A.  Protocols used: Leg Pain-A-AH

## 2023-04-19 ENCOUNTER — Telehealth: Payer: Self-pay | Admitting: Nurse Practitioner

## 2023-04-19 NOTE — Telephone Encounter (Signed)
 Pt is scheduled but may call to reschedule.

## 2023-04-22 ENCOUNTER — Other Ambulatory Visit: Payer: Self-pay | Admitting: Nurse Practitioner

## 2023-04-22 DIAGNOSIS — M7062 Trochanteric bursitis, left hip: Secondary | ICD-10-CM

## 2023-04-22 DIAGNOSIS — M7632 Iliotibial band syndrome, left leg: Secondary | ICD-10-CM

## 2023-04-30 ENCOUNTER — Other Ambulatory Visit: Payer: Self-pay | Admitting: Nurse Practitioner

## 2023-04-30 ENCOUNTER — Encounter: Payer: Self-pay | Admitting: Nurse Practitioner

## 2023-04-30 ENCOUNTER — Ambulatory Visit

## 2023-04-30 ENCOUNTER — Ambulatory Visit (INDEPENDENT_AMBULATORY_CARE_PROVIDER_SITE_OTHER): Admitting: Nurse Practitioner

## 2023-04-30 VITALS — BP 138/86 | HR 79 | Temp 98.2°F | Wt 347.2 lb

## 2023-04-30 DIAGNOSIS — E785 Hyperlipidemia, unspecified: Secondary | ICD-10-CM | POA: Diagnosis not present

## 2023-04-30 DIAGNOSIS — M1039 Gout due to renal impairment, multiple sites: Secondary | ICD-10-CM

## 2023-04-30 DIAGNOSIS — R053 Chronic cough: Secondary | ICD-10-CM | POA: Diagnosis not present

## 2023-04-30 DIAGNOSIS — B351 Tinea unguium: Secondary | ICD-10-CM

## 2023-04-30 DIAGNOSIS — M7632 Iliotibial band syndrome, left leg: Secondary | ICD-10-CM | POA: Insufficient documentation

## 2023-04-30 DIAGNOSIS — H34832 Tributary (branch) retinal vein occlusion, left eye, with macular edema: Secondary | ICD-10-CM | POA: Diagnosis not present

## 2023-04-30 DIAGNOSIS — E0822 Diabetes mellitus due to underlying condition with diabetic chronic kidney disease: Secondary | ICD-10-CM

## 2023-04-30 DIAGNOSIS — I132 Hypertensive heart and chronic kidney disease with heart failure and with stage 5 chronic kidney disease, or end stage renal disease: Secondary | ICD-10-CM

## 2023-04-30 DIAGNOSIS — E1169 Type 2 diabetes mellitus with other specified complication: Secondary | ICD-10-CM

## 2023-04-30 DIAGNOSIS — E119 Type 2 diabetes mellitus without complications: Secondary | ICD-10-CM | POA: Insufficient documentation

## 2023-04-30 DIAGNOSIS — Z6841 Body Mass Index (BMI) 40.0 and over, adult: Secondary | ICD-10-CM

## 2023-04-30 HISTORY — DX: Diabetes mellitus due to underlying condition with diabetic chronic kidney disease: E08.22

## 2023-04-30 LAB — HEPATIC FUNCTION PANEL
ALT: 60 U/L — ABNORMAL HIGH (ref 0–53)
AST: 39 U/L — ABNORMAL HIGH (ref 0–37)
Albumin: 4.2 g/dL (ref 3.5–5.2)
Alkaline Phosphatase: 98 U/L (ref 39–117)
Bilirubin, Direct: 0.1 mg/dL (ref 0.0–0.3)
Total Bilirubin: 0.6 mg/dL (ref 0.2–1.2)
Total Protein: 7.9 g/dL (ref 6.0–8.3)

## 2023-04-30 LAB — BRAIN NATRIURETIC PEPTIDE: Pro B Natriuretic peptide (BNP): 30 pg/mL (ref 0.0–100.0)

## 2023-04-30 LAB — HEMOGLOBIN A1C: Hgb A1c MFr Bld: 5.6 % (ref 4.6–6.5)

## 2023-04-30 LAB — LDL CHOLESTEROL, DIRECT: Direct LDL: 76 mg/dL

## 2023-04-30 MED ORDER — ALLOPURINOL 300 MG PO TABS: 150.0000 mg | ORAL_TABLET | Freq: Every day | ORAL | 1 refills | Status: AC

## 2023-04-30 MED ORDER — PANTOPRAZOLE SODIUM 20 MG PO TBEC: DELAYED_RELEASE_TABLET | ORAL | 0 refills | Status: AC

## 2023-04-30 NOTE — Assessment & Plan Note (Signed)
 Bilateral great toenails: hypertrophy and discoloration Associated with pain. No erythema or swelling noted.  Advised about possible adverse effects of lamisil and duration of treatment. He verbalized understanding and agreed to start med. Advised about need to return to lab monthly for repeat hepatic panel. Check hepatic panel today, will send lamisil if normal. Advised to also Soak feet daily in mixture of epsom salt and white vinegar. Apply tea tree or castor oil to toenails daily. Air feet as much as possible.

## 2023-04-30 NOTE — Assessment & Plan Note (Signed)
 BP at goal with imdur and coreg Managed by cardiology and nephrology BP Readings from Last 3 Encounters:  04/30/23 138/86  04/18/23 (!) 132/90  04/10/23 132/89

## 2023-04-30 NOTE — Assessment & Plan Note (Signed)
 Repeat hgbA1c and LDL No UACr due to end stage CKD with dialysis

## 2023-04-30 NOTE — Progress Notes (Signed)
 Established Patient Visit  Patient: Frank Coffey   DOB: [birth year]   43 y.o. Male  MRN: [id number] Visit Date: 04/30/2023  Subjective:    Chief Complaint  Patient presents with   ER F/U    Left Knee and ankle pain. Pt has been walking lately and hasn't experienced pain.    HPI Branch retinal vein occlusion of left eye Followed by retina specialist. He had appointment yesterday and is scheduled for surgical intervention per patient  Hypertensive heart and chronic kidney disease with heart failure and with stage 5 chronic kidney disease, or end stage renal disease (HCC) BP at goal with imdur and coreg Managed by cardiology and nephrology BP Readings from Last 3 Encounters:  04/30/23 138/86  04/18/23 (!) 132/90  04/10/23 132/89     Gout, unspecified Requested lab results from dialysis center. Current use of allopurinol 150mg  daily and colchicine prn  Hyperlipidemia associated with type 2 diabetes mellitus (HCC) Check direct LDL  DM (diabetes mellitus) (HCC) Repeat hgbA1c and LDL No UACr due to end stage CKD with dialysis  It band syndrome, left Resolved knee pain with oral prednisone x 5days Referred for outpatient PT  Morbid obesity with BMI of 50.0-59.9, adult (HCC) Advised to maintain Dash diet and daily exercise  Chronic cough Onset 48month ago, worse at night, unproductive, associated with vomiting in AM or hs Vomiting resolved with avoiding food intake within 3hrs of bedtime No PND, no SOB, no CP, no night sweats, no fever, no LE edema  GERD vs CHF exacerbation? Last Echo 2024: Stable Get H pylori today Sent pantoprazole  Onychomycosis Bilateral great toenails: hypertrophy and discoloration Associated with pain. No erythema or swelling noted.  Advised about possible adverse effects of lamisil and duration of treatment. He verbalized understanding and agreed to start med. Advised about need to return to lab monthly for repeat hepatic  panel. Check hepatic panel today, will send lamisil if normal. Advised to also Soak feet daily in mixture of epsom salt and white vinegar. Apply tea tree or castor oil to toenails daily. Air feet as much as possible.  Wt Readings from Last 3 Encounters:  04/30/23 (!) 347 lb 3.2 oz (157.5 kg)  04/18/23 (!) 342 lb 3.2 oz (155.2 kg)  04/10/23 (!) 342 lb (155.1 kg)    Reviewed medical, surgical, and social history today  Medications: Outpatient Medications Prior to Visit  Medication Sig   colchicine 0.6 MG tablet Take 0.6 mg by mouth daily.   dorzolamide-timolol (COSOPT) 22.3-6.8 MG/ML ophthalmic solution Place 1 drop into the left eye 2 (two) times daily.   isosorbide mononitrate (IMDUR) 60 MG 24 hr tablet Take 60 mg by mouth daily.   latanoprost (XALATAN) 0.005 % ophthalmic solution Place 1 drop into the left eye at bedtime.   lidocaine-prilocaine (EMLA) cream Apply 1 Application topically daily as needed (Dialysis days).   LINZESS 145 MCG CAPS capsule TAKE 1 CAPSULE(145 MCG) BY MOUTH DAILY BEFORE BREAKFAST. GIVEN**   methylPREDNISolone (MEDROL DOSEPAK) 4 MG TBPK tablet Take as directed, with food   sevelamer carbonate (RENVELA) 800 MG tablet Take 800 mg by mouth 3 (three) times daily with meals.   XPHOZAH 30 MG TABS Take 30 mg by mouth 2 (two) times daily.   [DISCONTINUED] allopurinol (ZYLOPRIM) 300 MG tablet Take 0.5 tablets (150 mg total) by mouth every dialysis (after dialysis).   carvedilol (COREG) 25 MG tablet TAKE 1 TABLET(25  MG) BY MOUTH TWICE DAILY WITH A MEAL (Patient not taking: No sig reported)   [DISCONTINUED] albuterol (VENTOLIN HFA) 108 (90 Base) MCG/ACT inhaler Inhale 1-2 puffs into the lungs every 6 (six) hours as needed for wheezing (cough). (Patient not taking: Reported on 04/18/2023)   [DISCONTINUED] diclofenac Sodium (VOLTAREN) 1 % GEL Apply 4 g topically 4 (four) times daily. (Patient not taking: Reported on 04/30/2023)   [DISCONTINUED] loperamide (IMODIUM A-D) 2 MG  tablet Take 1 tablet (2 mg total) by mouth 4 (four) times daily as needed for diarrhea or loose stools. (Patient not taking: Reported on 04/30/2023)   Facility-Administered Medications Prior to Visit  Medication Dose Route Frequency Provider   0.9 %  sodium chloride infusion  250 mL Intravenous PRN Maeola Harman, MD   sodium chloride flush (NS) 0.9 % injection 3 mL  3 mL Intravenous Q12H Maeola Harman, MD   Reviewed past medical and social history.   ROS per HPI above      Objective:  BP 138/86   Pulse 79   Temp 98.2 F (36.8 C) (Temporal)   Wt (!) 347 lb 3.2 oz (157.5 kg)   SpO2 99%   BMI 52.02 kg/m      Physical Exam Vitals and nursing note reviewed.  Constitutional:      Appearance: He is obese.  Cardiovascular:     Rate and Rhythm: Normal rate and regular rhythm.     Pulses: Normal pulses.     Heart sounds: Normal heart sounds.  Pulmonary:     Effort: Pulmonary effort is normal.     Breath sounds: Normal breath sounds.  Musculoskeletal:     Right lower leg: Edema present.     Left lower leg: Edema present.  Neurological:     Mental Status: He is alert and oriented to person, place, and time.     No results found for any visits on 04/30/23.    Assessment & Plan:    Problem List Items Addressed This Visit     Branch retinal vein occlusion of left eye   Followed by retina specialist. He had appointment yesterday and is scheduled for surgical intervention per patient      Chronic cough   Onset 17month ago, worse at night, unproductive, associated with vomiting in AM or hs Vomiting resolved with avoiding food intake within 3hrs of bedtime No PND, no SOB, no CP, no night sweats, no fever, no LE edema  GERD vs CHF exacerbation? Last Echo 2024: Stable Get H pylori today Sent pantoprazole      Relevant Medications   pantoprazole (PROTONIX) 20 MG tablet   Other Relevant Orders   H. pylori breath test   B Nat Peptide   DM (diabetes  mellitus) (HCC)   Repeat hgbA1c and LDL No UACr due to end stage CKD with dialysis      Relevant Orders   Hemoglobin A1c   Gout, unspecified - Primary   Requested lab results from dialysis center. Current use of allopurinol 150mg  daily and colchicine prn      Relevant Medications   allopurinol (ZYLOPRIM) 300 MG tablet   Hyperlipidemia associated with type 2 diabetes mellitus (HCC)   Check direct LDL      Relevant Orders   Direct LDL   Hypertensive heart and chronic kidney disease with heart failure and with stage 5 chronic kidney disease, or end stage renal disease (HCC)   BP at goal with imdur and coreg Managed by cardiology and  nephrology BP Readings from Last 3 Encounters:  04/30/23 138/86  04/18/23 (!) 132/90  04/10/23 132/89         Relevant Orders   For home use only DME Other see comment   It band syndrome, left   Resolved knee pain with oral prednisone x 5days Referred for outpatient PT      Morbid obesity with BMI of 50.0-59.9, adult (HCC)   Advised to maintain Dash diet and daily exercise      Onychomycosis   Bilateral great toenails: hypertrophy and discoloration Associated with pain. No erythema or swelling noted.  Advised about possible adverse effects of lamisil and duration of treatment. He verbalized understanding and agreed to start med. Advised about need to return to lab monthly for repeat hepatic panel. Check hepatic panel today, will send lamisil if normal. Advised to also Soak feet daily in mixture of epsom salt and white vinegar. Apply tea tree or castor oil to toenails daily. Air feet as much as possible.      Relevant Orders   Hepatic function panel   Hepatic function panel   Return in about 3 months (around 07/30/2023) for HTN, DM, hyperlipidemia (fasting).     Kathrene Parents, NP

## 2023-04-30 NOTE — Assessment & Plan Note (Addendum)
 Onset 52month ago, worse at night, unproductive, associated with vomiting in AM or hs Vomiting resolved with avoiding food intake within 3hrs of bedtime No PND, no SOB, no CP, no night sweats, no fever, no LE edema  GERD vs CHF exacerbation? Last Echo 2024: Stable Get H pylori today Sent pantoprazole

## 2023-04-30 NOTE — Patient Instructions (Addendum)
 Go to lab Will send lamisil prescription after review of current liver function. You will need to get liver function test monthly while taking lamisil Soak feet daily in mixture of epsom salt and white vinegar. Apply tea tree or castor oil to toenails daily. Air feet as much as possible. Avoid eating within 3hrs of bedtime Monitor BP at home daily. Contact cardiology if BP>140/80 or <100/70

## 2023-04-30 NOTE — Assessment & Plan Note (Signed)
Check direct LDL 

## 2023-04-30 NOTE — Assessment & Plan Note (Signed)
 Followed by retina specialist. He had appointment yesterday and is scheduled for surgical intervention per patient

## 2023-04-30 NOTE — Assessment & Plan Note (Signed)
 Resolved knee pain with oral prednisone x 5days Referred for outpatient PT

## 2023-04-30 NOTE — Assessment & Plan Note (Signed)
 Requested lab results from dialysis center. Current use of allopurinol 150mg  daily and colchicine prn

## 2023-04-30 NOTE — Assessment & Plan Note (Signed)
 Advised to maintain Dash diet and daily exercise

## 2023-05-01 LAB — H. PYLORI BREATH TEST: H. pylori Breath Test: NOT DETECTED

## 2023-05-02 ENCOUNTER — Encounter: Payer: Self-pay | Admitting: Nurse Practitioner

## 2023-05-02 NOTE — Addendum Note (Signed)
 Addended by: Kathrene Parents L on: 05/02/2023 04:07 PM   Modules accepted: Orders

## 2023-05-02 NOTE — Telephone Encounter (Signed)
 Duplicate request. Rx sent 04/30/23.

## 2023-05-16 ENCOUNTER — Ambulatory Visit: Attending: Nurse Practitioner

## 2023-05-22 ENCOUNTER — Telehealth (INDEPENDENT_AMBULATORY_CARE_PROVIDER_SITE_OTHER): Payer: Self-pay | Admitting: Otolaryngology

## 2023-05-22 NOTE — Telephone Encounter (Signed)
 I left a voicemail with the new address of Dr. Dr. Soldatova.

## 2023-05-25 ENCOUNTER — Encounter (INDEPENDENT_AMBULATORY_CARE_PROVIDER_SITE_OTHER): Payer: Self-pay | Admitting: Otolaryngology

## 2023-05-25 ENCOUNTER — Other Ambulatory Visit (HOSPITAL_COMMUNITY): Payer: Self-pay | Admitting: Otolaryngology

## 2023-05-25 ENCOUNTER — Ambulatory Visit (INDEPENDENT_AMBULATORY_CARE_PROVIDER_SITE_OTHER): Admitting: Otolaryngology

## 2023-05-25 VITALS — BP 129/88 | HR 95 | Ht 68.0 in | Wt 337.0 lb

## 2023-05-25 DIAGNOSIS — J343 Hypertrophy of nasal turbinates: Secondary | ICD-10-CM

## 2023-05-25 DIAGNOSIS — J3489 Other specified disorders of nose and nasal sinuses: Secondary | ICD-10-CM

## 2023-05-25 DIAGNOSIS — R053 Chronic cough: Secondary | ICD-10-CM | POA: Diagnosis not present

## 2023-05-25 DIAGNOSIS — K219 Gastro-esophageal reflux disease without esophagitis: Secondary | ICD-10-CM

## 2023-05-25 DIAGNOSIS — Z9109 Other allergy status, other than to drugs and biological substances: Secondary | ICD-10-CM

## 2023-05-25 DIAGNOSIS — Z992 Dependence on renal dialysis: Secondary | ICD-10-CM

## 2023-05-25 DIAGNOSIS — G4733 Obstructive sleep apnea (adult) (pediatric): Secondary | ICD-10-CM | POA: Diagnosis not present

## 2023-05-25 DIAGNOSIS — J309 Allergic rhinitis, unspecified: Secondary | ICD-10-CM

## 2023-05-25 DIAGNOSIS — J342 Deviated nasal septum: Secondary | ICD-10-CM

## 2023-05-25 DIAGNOSIS — R0982 Postnasal drip: Secondary | ICD-10-CM | POA: Diagnosis not present

## 2023-05-25 DIAGNOSIS — R1310 Dysphagia, unspecified: Secondary | ICD-10-CM

## 2023-05-25 DIAGNOSIS — R059 Cough, unspecified: Secondary | ICD-10-CM

## 2023-05-25 DIAGNOSIS — N2581 Secondary hyperparathyroidism of renal origin: Secondary | ICD-10-CM

## 2023-05-25 DIAGNOSIS — R0981 Nasal congestion: Secondary | ICD-10-CM | POA: Diagnosis not present

## 2023-05-25 MED ORDER — LEVOCETIRIZINE DIHYDROCHLORIDE 5 MG PO TABS: 5.0000 mg | ORAL_TABLET | Freq: Every evening | ORAL | 3 refills | Status: AC

## 2023-05-25 MED ORDER — FLUTICASONE PROPIONATE 50 MCG/ACT NA SUSP: 2.0000 | Freq: Every day | NASAL | 6 refills | Status: AC

## 2023-05-25 NOTE — Progress Notes (Signed)
 ENT CONSULT:  Reason for Consult: chronic cough   HPI: Discussed the use of AI scribe software for clinical note transcription with the patient, who gave verbal consent to proceed.  History of Present Illness Frank Coffey is a 43 year old male with hx of ESRD chronic kidney disease, hx of HTN (not on ACEi) hx of CHF, hx of OSA on CPAP (poor compliance 2/2 CPAP intolerance),  on dialysis who presents with chronic cough.  He has experienced a chronic cough on and off for years, but has had acute worsening of sx since March, 2025 following an illness that lasted about a week. The cough has not resolved and occurs more frequently than expected, sometimes leading to vomiting, particularly at night, with the vomitus resembling acid. The cough recurs annually, typically following a cold, and takes months to resolve. He has not seen a pulmonary specialist or had a chest x-ray for this issue.  He has not been diagnosed or tested for allergies. 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 He takes Benadryl  due to itching after dialysis. He has not been allergy tested before.  He is on dialysis due to ESRD, diagnosed at age 28, with dialysis starting at age 58. His dialysis schedule is Tuesday, Thursday, and Saturday. 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   He has a history of reflux, for which he takes Nexium at night. He notes that pasta salad has caused regurgitation, with the food being undigested. He avoids dietary triggers for heartburn such as soda, coffee, and red meat due to his kidney condition. Not on ACE inhibitors.  He has a CPAP machine for sleep apnea but struggles to use it consistently, often finding it across the bed in the morning. His wife encourages its use, but he finds it uncomfortable. No history of sinus surgery. No difficulty  swallowing.   Records Reviewed:  Vascular surgery 01/24/23 with with PA Eveland  HPI:  This is a 43 y.o. male who is s/p revision of left arm cephalic vein fistula with transposition by Dr. Vikki Graves on 12/22/2022.  Initial fistula creation was in August of this year.  He denies any signs or symptoms of steal syndrome in his left hand.  He believes the incisions have completely healed in his arm.  He recently had his right IJ TDC exchanged.  He dialyzes at the industrial [facility] location on a Tuesday Thursday Saturday schedule via right IJ TDC.    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   History of kidney stones    Passed   Hx of migraine headaches    Hyperkalemia 12/06/2021   Hyperlipidemia    Hypertension    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)  IBS (irritable bowel syndrome)    Obesity    OSA (obstructive sleep apnea)    Pneumonia 04/2019   with Covid   Sleep apnea     Past Surgical History:  Procedure Laterality Date   AV FISTULA PLACEMENT Right 02/17/2022   Procedure: RIGHT ARTERIOVENOUS (AV) FISTULA CREATION;  Surgeon: Adine Hoof, MD;  Location: [facility] OR;  Service: Vascular;  Laterality: Right;   AV FISTULA PLACEMENT Right 05/26/2022   Procedure: RIGHT ARM BRACHIOCEPHALIC ARTERIOVENOUS (AV) FISTULA CREATION;  Surgeon: Adine Hoof, MD;   Location: [facility] OR;  Service: Vascular;  Laterality: Right;   AV FISTULA PLACEMENT Left 09/01/2022   Procedure: LEFT ARM ARTERIOVENOUS (AV) FISTULA;  Surgeon: Adine Hoof, MD;  Location: [facility] OR;  Service: Vascular;  Laterality: Left;   BIOPSY  02/09/2020   Procedure: BIOPSY;  Surgeon: Normie Becton., MD;  Location: [facility] ENDOSCOPY;  Service: Gastroenterology;;   CHOLECYSTECTOMY     COLONOSCOPY WITH PROPOFOL  N/A 02/09/2020   Procedure: COLONOSCOPY WITH PROPOFOL ;  Surgeon: Normie Becton., MD;  Location: [facility] ENDOSCOPY;  Service: Gastroenterology;  Laterality: N/A;   DIALYSIS/PERMA CATHETER INSERTION N/A 01/23/2023   Procedure: DIALYSIS/PERMA CATHETER INSERTION;  Surgeon: Baron Border, MD;  Location: [facility] INVASIVE CV LAB;  Service: Cardiovascular;  Laterality: N/A;   DIALYSIS/PERMA CATHETER REMOVAL N/A 01/23/2023   Procedure: DIALYSIS/PERMA CATHETER REMOVAL;  Surgeon: Baron Border, MD;  Location: [facility] INVASIVE CV LAB;  Service: Cardiovascular;  Laterality: N/A;   ESOPHAGOGASTRODUODENOSCOPY (EGD) WITH PROPOFOL  N/A 02/09/2020   Procedure: ESOPHAGOGASTRODUODENOSCOPY (EGD) WITH PROPOFOL ;  Surgeon: Brice Campi Albino Alu., MD;  Location: [facility] ENDOSCOPY;  Service: Gastroenterology;  Laterality: N/A;   EYE SURGERY  01/18/2011   FISTULA SUPERFICIALIZATION Left 12/22/2022   Procedure: LEFT ARM FISTULA SUPERFICIALIZATION;  Surgeon: Adine Hoof, MD;  Location: [facility] OR;  Service: Vascular;  Laterality: Left;   IR FLUORO GUIDE CV LINE RIGHT  12/07/2021   IR FLUORO GUIDE CV LINE RIGHT  06/07/2022   IR PTA VENOUS EXCEPT DIALYSIS CIRCUIT  06/07/2022   IR US  GUIDE VASC ACCESS RIGHT  12/07/2021   PERIPHERAL VASCULAR BALLOON ANGIOPLASTY Right 01/23/2023   Procedure: PERIPHERAL VASCULAR BALLOON ANGIOPLASTY;  Surgeon: Baron Border, MD;  Location: [facility] INVASIVE CV LAB;  Service: Cardiovascular;  Laterality: Right;  SVC   POLYPECTOMY  02/09/2020   Procedure: POLYPECTOMY;  Surgeon:  Mansouraty, Albino Alu., MD;  Location: [facility] ENDOSCOPY;  Service: Gastroenterology;;   RETINAL DETACHMENT REPAIR W/ SCLERAL BUCKLE LE     Left   UPPER EXTREMITY VENOGRAPHY Left 07/17/2022   Procedure: UPPER EXTREMITY VENOGRAPHY;  Surgeon: Adine Hoof, MD;  Location[facility] INVASIVE CV LAB;  Service: Cardiovascular;  Laterality: Left;    Family History  Problem Relation Age of Onset   Hypertension Mother    Hyperlipidemia Mother    Heart disease Mother    Kidney disease Mother    Depression Mother    Stomach cancer Father    Healthy Sister        x2   Healthy Brother        x2   Hypertension Maternal Grandmother    Stroke Maternal Grandmother    Heart disease Maternal Grandmother    Hyperlipidemia Maternal Grandmother    Stroke Maternal Grandfather    Hypertension Maternal Grandfather    Heart disease Maternal Grandfather    Hyperlipidemia Maternal Grandfather    Alzheimer's disease Maternal Grandfather    Cancer - Other Paternal Grandmother    Healthy Daughter  x1   Healthy Son        x2   Diabetes Neg Hx    Heart attack Neg Hx    Sudden death Neg Hx    Colon cancer Neg Hx    Esophageal cancer Neg Hx    Pancreatic cancer Neg Hx    Inflammatory bowel disease Neg Hx    Liver disease Neg Hx    Rectal cancer Neg Hx     Social History:  reports that he has never smoked. He has never used smokeless tobacco. He reports that he does not currently use alcohol. He reports that he does not use drugs.  Allergies: No Known Allergies  Medications: I have reviewed the patient's current medications.  The PMH, PSH, Medications, Allergies, and SH were reviewed and updated.  ROS: Constitutional: Negative for fever, weight loss and weight gain. Cardiovascular: Negative for chest pain and dyspnea on exertion. Respiratory: Is not experiencing shortness of breath at rest. Gastrointestinal: Negative for nausea and vomiting. Neurological: Negative for  headaches. Psychiatric: The patient is not nervous/anxious  Blood pressure 129/88, pulse 95, height 5\' 8"  (1.727 m), weight (!) 337 lb (152.9 kg), SpO2 94%. Body mass index is 51.24 kg/m.  PHYSICAL EXAM:  Exam: General: Well-developed, well-nourished Respiratory Respiratory effort: Equal inspiration and expiration without stridor Cardiovascular Peripheral Vascular: Warm extremities with equal color/perfusion Eyes: No nystagmus with equal extraocular motion bilaterally Neuro/Psych/Balance: Patient oriented to person, place, and time; Appropriate mood and affect; Gait is intact with no imbalance; Cranial nerves I-XII are intact Head and Face Inspection: Normocephalic and atraumatic without mass or lesion Palpation: Facial skeleton intact without bony stepoffs Salivary Glands: No mass or tenderness Facial Strength: Facial motility symmetric and full bilaterally ENT Pinna: External ear intact and fully developed External canal: Canal is patent with intact skin Tympanic Membrane: Clear and mobile External Nose: No scar or anatomic deformity Internal Nose: Septum is deviated to the left and S-shaped. No polyp, or purulence. Mucosal edema and erythema present. Narrow nasal passages R > L Bilateral inferior turbinate hypertrophy.  Lips, Teeth, and gums: Mucosa and teeth intact and viable TMJ: No pain to palpation with full mobility Oral cavity/oropharynx: No erythema or exudate, no lesions present Nasopharynx: No mass or lesion with intact mucosa Hypopharynx: Intact mucosa without pooling of secretions Larynx Glottic: Full true vocal cord mobility without lesion or mass Supraglottic: Normal appearing epiglottis and AE folds Interarytenoid Space: Moderate pachydermia&edema Subglottic Space: Patent without lesion or edema Neck Neck and Trachea: Midline trachea without mass or lesion Thyroid : No mass or nodularity Lymphatics: No lymphadenopathy  Procedure: Preoperative diagnosis:  chronic cough   Postoperative diagnosis:   Same  Procedure: Flexible fiberoptic laryngoscopy  Surgeon: Artice Last, MD  Anesthesia: Topical lidocaine  and Afrin Complications: None Condition is stable throughout exam  Indications and consent:  The patient presents to the clinic with above symptoms. Indirect laryngoscopy view was incomplete. Thus it was recommended that they undergo a flexible fiberoptic laryngoscopy. All of the risks, benefits, and potential complications were reviewed with the patient preoperatively and verbal informed consent was obtained.  Procedure: The patient was seated upright in the clinic. Topical lidocaine  and Afrin were applied to the nasal cavity. After adequate anesthesia had occurred, I then proceeded to pass the flexible telescope into the nasal cavity. The nasal cavity was patent without rhinorrhea or polyp. The nasopharynx was also patent without mass or lesion. The base of tongue was visualized and was normal. There were no signs of pooling  of secretions in the piriform sinuses. The true vocal folds were mobile bilaterally. There were no signs of glottic or supraglottic mucosal lesion or mass. There was moderate interarytenoid pachydermia and post cricoid edema. The telescope was then slowly withdrawn and the patient tolerated the procedure throughout.    PROCEDURE NOTE: nasal endoscopy  Preoperative diagnosis: chronic nasal congestion symptoms  Postoperative diagnosis: same  Procedure: Diagnostic nasal endoscopy (40981)  Surgeon: Artice Last, M.D.  Anesthesia: Topical lidocaine  and Afrin  H&P REVIEW: The patient's history and physical were reviewed today prior to procedure. All medications were reviewed and updated as well. Complications: None Condition is stable throughout exam Indications and consent: The patient presents with symptoms of chronic sinusitis not responding to previous therapies. All the risks, benefits, and potential  complications were reviewed with the patient preoperatively and informed consent was obtained. The time out was completed with confirmation of the correct procedure.   Procedure: The patient was seated upright in the clinic. Topical lidocaine  and Afrin were applied to the nasal cavity. After adequate anesthesia had occurred, the rigid nasal endoscope was passed into the nasal cavity. The nasal mucosa, turbinates, septum, and sinus drainage pathways were visualized bilaterally. This revealed no purulence or significant secretions that might be cultured. There were no polyps or sites of significant inflammation. The mucosa was intact and there was no crusting present. The scope was then slowly withdrawn and the patient tolerated the procedure well. There were no complications or blood loss.      Studies Reviewed: CXR 2 View 08/05/22 FINDINGS: Low volume film. Vascular congestion with probable atelectasis in the bases, left greater than right. Cardiopericardial silhouette is at upper limits of normal for size. Right IJ dialysis catheter tip overlies the SVC/RA junction. No evidence for kink within the course of the catheter.   IMPRESSION: 1. Low volume film with vascular congestion and probable atelectasis in the bases, left greater than right. 2. Right IJ dialysis catheter tip overlies the SVC/RA junction.  Assessment/Plan: Encounter Diagnoses  Name Primary?   Chronic cough Yes   Environmental allergies    Secondary hyperparathyroidism of renal origin (HCC)    Gastro-esophageal reflux disease without esophagitis    ESRD (end stage renal disease) on dialysis (HCC)    Dysphagia, unspecified type     Assessment and Plan Assessment & Plan Chronic cough for several years I discussed common etiologies of chronic cough including GERD/allergies and PND and lung disease with the patient and explained that in most cases the cause of chronic cough is multi-factorial  Chronic cough with  exacerbations after an illness. Sx of environmental allergies. Sometimes associated with emesis/regurgitation of undigested foods. Reports the cough is sometimes dry but he does sometimes cough up phlegm. No prior evaluation by Pulm. Last CXR approximately a year ago with evidence of vascular congestion and atelectasis.    Exam with significant post-nasal drainage, clear and not infected, and findings concerning for GERD LPR otherwise no masses or lesions and no pooling of secretions in hypopharynx.   - Refer to pulmonology for pulmonary testing and evaluation. - Order chest x-ray to rule out lung issues  - Order swallow test to assess hx of food regurgitation and risk factors for GERD such as hiatal hernia - medical management of PND and GERD as below  Gastroesophageal reflux disease (GERD) LPR GERD with regurgitation and nocturnal cough. Current PPI timing suboptimal. Reflux Gourmet may aid in preventing reflux. Denies dysphagia but reports food regurgitation.  - Advise taking Nexium  30 minutes before breakfast. - Continue Protonix  as prescribed - take 30 min prior to breakfast - Recommend Reflux Gourmet after meals as long as safe from dialysis standpoint. - diet and lifestyle changes to minimize GERD - MBS/esophagram   Chronic nasal congestion post-nasal drainage Environmental allergies Nasal endoscopy with septal deviation and ITH likely contributing. We also discussed that being on dialysis increases his risk of nasal congestion and CPAP use sometimes exacerbates nasal congestion as well.  Symptoms include nasal itching, sneezing, and postnasal drainage. Allergy testing needed. - Prescribed Flonase  nasal spray daily. - Prescribed Xyzal daily. - Refer for allergy testing.  OSA - continue CPAP and consider seeing Sleep medicine    RTC 4 months  Referral to Allergy and Pulm CXR MBS/esoph    Thank you for allowing me to participate in the care of this patient. Please do not  hesitate to contact me with any questions or concerns.   Artice Last, MD Otolaryngology [facility] Phone: [phone] Fax: [phone]    05/25/2023, 10:10 AM

## 2023-05-25 NOTE — Patient Instructions (Signed)

## 2023-05-27 ENCOUNTER — Telehealth: Admitting: Physician Assistant

## 2023-05-27 ENCOUNTER — Encounter: Payer: Self-pay | Admitting: Physician Assistant

## 2023-05-27 DIAGNOSIS — R051 Acute cough: Secondary | ICD-10-CM

## 2023-05-27 NOTE — Progress Notes (Signed)
 I understand that you are experiencing a bothersome cough.  I see that you were seen by ENT on 05/25/23 and started on a regimen for this.  I encourage you to complete that regimen as prescribed.    You will not be charged for this evisit.  I have spent 5 minutes in review of e-visit questionnaire, review and updating patient chart, medical decision making and response to patient.   Etter Hermann Mayers, PA-C

## 2023-05-28 ENCOUNTER — Telehealth: Admitting: Physician Assistant

## 2023-05-28 DIAGNOSIS — R350 Frequency of micturition: Secondary | ICD-10-CM

## 2023-05-28 DIAGNOSIS — N186 End stage renal disease: Secondary | ICD-10-CM

## 2023-05-28 NOTE — Progress Notes (Signed)
 E-Visit for Urinary Problems  Based on what you shared with me, I feel your condition warrants further evaluation and I recommend that you be seen for a face to face office visit.  Male bladder infections are not very common.  We worry about prostate or kidney conditions.  The standard of care is to examine the abdomen and kidneys, and to do a urine and blood test to make sure that something more serious is not going on.  We recommend that you see a provider today.  If your doctor's office is closed [facility] has the following Urgent Cares:   NOTE: There will be NO CHARGE for this E-Visit   If you are having a true medical emergency, please call 911.     For an urgent face to face visit, [facility] has multiple urgent care centers for your convenience.  Click the link below for the full list of locations and hours, walk-in wait times, appointment scheduling options and driving directions:  Urgent Care - [place], [place], [name], [place], [place], [place]  [place]     Your MyChart E-visit questionnaire answers were reviewed by a board certified advanced clinical practitioner to complete your personal care plan based on your specific symptoms.  Thank you for using e-Visits.        I have spent 5 minutes in review of e-visit questionnaire, review and updating patient chart, medical decision making and response to patient.   Angelia Kelp, PA-C

## 2023-05-30 ENCOUNTER — Ambulatory Visit: Admission: EM | Admit: 2023-05-30 | Discharge: 2023-05-30 | Disposition: A | Discharge status: Home / Self Care

## 2023-05-30 ENCOUNTER — Emergency Department (HOSPITAL_BASED_OUTPATIENT_CLINIC_OR_DEPARTMENT_OTHER)

## 2023-05-30 ENCOUNTER — Other Ambulatory Visit: Payer: Self-pay

## 2023-05-30 ENCOUNTER — Encounter (HOSPITAL_BASED_OUTPATIENT_CLINIC_OR_DEPARTMENT_OTHER): Payer: Self-pay | Admitting: Emergency Medicine

## 2023-05-30 ENCOUNTER — Emergency Department (HOSPITAL_BASED_OUTPATIENT_CLINIC_OR_DEPARTMENT_OTHER)
Admission: EM | Admit: 2023-05-30 | Discharge: 2023-05-31 | Disposition: A | Discharge status: Home / Self Care | Attending: Emergency Medicine | Admitting: Emergency Medicine

## 2023-05-30 DIAGNOSIS — N289 Disorder of kidney and ureter, unspecified: Secondary | ICD-10-CM | POA: Diagnosis not present

## 2023-05-30 DIAGNOSIS — Z992 Dependence on renal dialysis: Secondary | ICD-10-CM | POA: Diagnosis not present

## 2023-05-30 DIAGNOSIS — N186 End stage renal disease: Secondary | ICD-10-CM

## 2023-05-30 DIAGNOSIS — R350 Frequency of micturition: Secondary | ICD-10-CM | POA: Insufficient documentation

## 2023-05-30 DIAGNOSIS — R3911 Hesitancy of micturition: Secondary | ICD-10-CM

## 2023-05-30 LAB — POCT URINALYSIS DIP (MANUAL ENTRY)
Bilirubin, UA: NEGATIVE
Glucose, UA: NEGATIVE mg/dL
Ketones, POC UA: NEGATIVE mg/dL
Leukocytes, UA: NEGATIVE
Nitrite, UA: NEGATIVE
Protein Ur, POC: >=300 mg/dL — AB
Spec Grav, UA: 1.02 (ref 1.010–1.025)
Urobilinogen, UA: 0.2 U/dL
pH, UA: 7.5 (ref 5.0–8.0)

## 2023-05-30 LAB — COMPREHENSIVE METABOLIC PANEL WITH GFR
ALT: 50 U/L — ABNORMAL HIGH (ref 0–44)
AST: 47 U/L — ABNORMAL HIGH (ref 15–41)
Albumin: 4.2 g/dL (ref 3.5–5.0)
Alkaline Phosphatase: 155 U/L — ABNORMAL HIGH (ref 38–126)
Anion gap: 16 — ABNORMAL HIGH (ref 5–15)
BUN: 45 mg/dL — ABNORMAL HIGH (ref 6–20)
CO2: 27 mmol/L (ref 22–32)
Calcium: 10.3 mg/dL (ref 8.9–10.3)
Chloride: 93 mmol/L — ABNORMAL LOW (ref 98–111)
Creatinine, Ser: 12.6 mg/dL — ABNORMAL HIGH (ref 0.61–1.24)
GFR, Estimated: 5 mL/min — ABNORMAL LOW (ref >60)
Glucose, Bld: 91 mg/dL (ref 70–99)
Potassium: 4.6 mmol/L (ref 3.5–5.1)
Sodium: 136 mmol/L (ref 135–145)
Total Bilirubin: 0.7 mg/dL (ref 0.0–1.2)
Total Protein: 8.5 g/dL — ABNORMAL HIGH (ref 6.5–8.1)

## 2023-05-30 LAB — URINALYSIS, ROUTINE W REFLEX MICROSCOPIC
Bilirubin Urine: NEGATIVE
Glucose, UA: 100 mg/dL — AB
Ketones, ur: NEGATIVE mg/dL
Leukocytes,Ua: NEGATIVE
Nitrite: NEGATIVE
Protein, ur: >=300 mg/dL — AB
Specific Gravity, Urine: 1.02 (ref 1.005–1.030)
pH: 8.5 — ABNORMAL HIGH (ref 5.0–8.0)

## 2023-05-30 LAB — CBC WITH DIFFERENTIAL/PLATELET
Abs Immature Granulocytes: 0.04 10*3/uL (ref 0.00–0.07)
Basophils Absolute: 0.1 10*3/uL (ref 0.0–0.1)
Basophils Relative: 1 %
Eosinophils Absolute: 0.3 10*3/uL (ref 0.0–0.5)
Eosinophils Relative: 4 %
HCT: 37.1 % — ABNORMAL LOW (ref 39.0–52.0)
Hemoglobin: 11.8 g/dL — ABNORMAL LOW (ref 13.0–17.0)
Immature Granulocytes: 0 %
Lymphocytes Relative: 17 %
Lymphs Abs: 1.6 10*3/uL (ref 0.7–4.0)
MCH: 29.3 pg (ref 26.0–34.0)
MCHC: 31.8 g/dL (ref 30.0–36.0)
MCV: 92.1 fL (ref 80.0–100.0)
Monocytes Absolute: 1.4 10*3/uL — ABNORMAL HIGH (ref 0.1–1.0)
Monocytes Relative: 15 %
Neutro Abs: 5.8 10*3/uL (ref 1.7–7.7)
Neutrophils Relative %: 63 %
Platelets: 251 10*3/uL (ref 150–400)
RBC: 4.03 MIL/uL — ABNORMAL LOW (ref 4.22–5.81)
RDW: 14.5 % (ref 11.5–15.5)
WBC: 9.2 10*3/uL (ref 4.0–10.5)
nRBC: 0 % (ref 0.0–0.2)

## 2023-05-30 LAB — URINALYSIS, MICROSCOPIC (REFLEX)

## 2023-05-30 MED ORDER — DIPHENHYDRAMINE HCL 25 MG PO CAPS
50.0000 mg | ORAL_CAPSULE | Freq: Once | ORAL | Status: AC
  Administered 2023-05-30: 50 mg via ORAL
  Filled 2023-05-30: qty 2

## 2023-05-30 NOTE — ED Triage Notes (Signed)
 PtPOV sent from UC- reports increased urinary frequency, feeling of pressure and inability to fully empty bladder x3 days. Feels bloated. Denies swelling in legs, abd. Also reports decreased appetite x3 days. Denies fever, n/v/d.  Denies dysuria, discharge.  Pt is dialysis pt, tue/thur/sat. Compliant.   Pt had UA done at [facility].

## 2023-05-30 NOTE — ED Triage Notes (Signed)
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

## 2023-05-30 NOTE — ED Notes (Signed)
 Pt request 50 mg of Benadryl , states takes nightly for itching related to dialysis. EDP made aware and verbal order given.

## 2023-05-30 NOTE — ED Notes (Signed)
 Patient is being discharged from the Urgent Care and sent to the Emergency Department via POV . Per Koleen Perna, NP, patient is in need of higher level of care due to r/o kidney stone. Patient is aware and verbalizes understanding of plan of care.  Vitals:   05/30/23 1924  BP: 111/76  Pulse: 96  Resp: 20  Temp: 98.3 F (36.8 C)  SpO2: 96%

## 2023-05-30 NOTE — Discharge Instructions (Signed)
 Please go to the nearest ER for further workup and evaluation to rule out acute kidney injury, kidney stone due to decreased output in the setting of end stage renal disease.

## 2023-05-30 NOTE — ED Notes (Signed)
 Pt let NT know he has dialysis at 0500.  This NT let charge nurse know and charge nurse notified provider.

## 2023-05-30 NOTE — ED Provider Notes (Signed)
 [facility]    CSN: [id number] Arrival date & time: 05/30/23  1813      History   Chief Complaint Chief Complaint  Patient presents with   Urinary Problem    HPI Frank Coffey is a 43 y.o. male.   Patient with history of ESRD on dialysis, Type 2 Diabetes, HTN, and CHF presents to urgent care for evaluation of urinary frequency that started last week. He is typically able to void normally and has noticed a significant decrease in urinary output and increase in urinary frequency over the last few days. Reports sensation of urinary hesitancy and straining to fully empty bladder. Denies dysuria, gross hematuria, flank pain, dizziness, headache, fever, chills, penile discharge, recent weight gain (weight loss WITH trying), and nausea/vomiting. Denies abdominal pain and low back pain. He does not drink urinary irritants and drinks as much water  as his renal restrictions will allow.  Urine comes out but he has to strain and he still feels like his bladder has urine in it. History of kidney stones, states this feels slightly similar. He has not missed any dialysis sessions (Tuesday, Thursday, Saturday).      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   History of kidney stones    Passed   Hx of migraine headaches    Hyperkalemia 12/06/2021   Hyperlipidemia    Hypertension    Hypertensive heart disease with congestive heart failure and stage 3 kidney  disease (HCC)    IBS (irritable bowel syndrome)    Male erectile dysfunction, unspecified 10/02/2016   Obesity    OSA (obstructive sleep apnea)    Penile discharge 07/19/2022   Pneumonia 04/2019   with Covid   Sleep apnea     Patient Active Problem List   Diagnosis Date Noted   DM (diabetes mellitus) (HCC) 04/30/2023   Chronic cough 04/30/2023   It band syndrome, left 04/30/2023   Secondary hyperparathyroidism of renal origin (HCC) 12/30/2021   Anemia due to chronic kidney disease 12/07/2021   Combined systolic and diastolic cardiac dysfunction 12/07/2021   Secondary open-angle glaucoma, left, severe stage 11/10/2020   Morbid obesity with BMI of 50.0-59.9, adult (HCC) 02/12/2020   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   Gastro-esophageal reflux disease without esophagitis 12/09/2019   Family history of gastric cancer 12/09/2019   Hyperlipidemia associated with type 2 diabetes mellitus (HCC) 04/30/2019   Atrioventricular block, second degree 11/18/2017   Hypertensive urgency 11/14/2017   Personal history of transient ischemic attack (TIA), and cerebral infarction without residual deficits 11/14/2017   Migraine 10/02/2016   Onychomycosis 02/22/2016   NICM (nonischemic cardiomyopathy) (HCC) 07/19/2015   Chronic idiopathic constipation 04/30/2013   History of retinal detachment 02/07/2013   Chronic systolic CHF (congestive heart failure) (HCC) 12/19/2012   Gout, unspecified 12/19/2012   ESRD (end stage renal disease) on dialysis (HCC) 12/19/2012   OSA (obstructive sleep apnea) 12/19/2012  Hypertensive heart and chronic kidney disease with heart failure and with stage 5 chronic kidney disease, or end stage renal disease (HCC) 12/19/2012   Posterior subcapsular age-related cataract of left eye 02/23/2012   Branch retinal vein occlusion of left eye 02/14/2011   Retinal detachment 01/05/2011    Past Surgical History:  Procedure Laterality Date   AV FISTULA  PLACEMENT Right 02/17/2022   Procedure: RIGHT ARTERIOVENOUS (AV) FISTULA CREATION;  Surgeon: Adine Hoof, MD;  Location: [facility] OR;  Service: Vascular;  Laterality: Right;   AV FISTULA PLACEMENT Right 05/26/2022   Procedure: RIGHT ARM BRACHIOCEPHALIC ARTERIOVENOUS (AV) FISTULA CREATION;  Surgeon: Adine Hoof, MD;  Location: [facility] OR;  Service: Vascular;  Laterality: Right;   AV FISTULA PLACEMENT Left 09/01/2022   Procedure: LEFT ARM ARTERIOVENOUS (AV) FISTULA;  Surgeon: Adine Hoof, MD;  Location: [facility] OR;  Service: Vascular;  Laterality: Left;   BIOPSY  02/09/2020   Procedure: BIOPSY;  Surgeon: Normie Becton., MD;  Location: [facility] ENDOSCOPY;  Service: Gastroenterology;;   CHOLECYSTECTOMY     COLONOSCOPY WITH PROPOFOL  N/A 02/09/2020   Procedure: COLONOSCOPY WITH PROPOFOL ;  Surgeon: Normie Becton., MD;  Location: [facility] ENDOSCOPY;  Service: Gastroenterology;  Laterality: N/A;   DIALYSIS/PERMA CATHETER INSERTION N/A 01/23/2023   Procedure: DIALYSIS/PERMA CATHETER INSERTION;  Surgeon: Baron Border, MD;  Location: [facility] INVASIVE CV LAB;  Service: Cardiovascular;  Laterality: N/A;   DIALYSIS/PERMA CATHETER REMOVAL N/A 01/23/2023   Procedure: DIALYSIS/PERMA CATHETER REMOVAL;  Surgeon: Baron Border, MD;  Location: [facility] INVASIVE CV LAB;  Service: Cardiovascular;  Laterality: N/A;   ESOPHAGOGASTRODUODENOSCOPY (EGD) WITH PROPOFOL  N/A 02/09/2020   Procedure: ESOPHAGOGASTRODUODENOSCOPY (EGD) WITH PROPOFOL ;  Surgeon: Brice Campi Albino Alu., MD;  Location: [facility] ENDOSCOPY;  Service: Gastroenterology;  Laterality: N/A;   EYE SURGERY  01/18/2011   FISTULA SUPERFICIALIZATION Left 12/22/2022   Procedure: LEFT ARM FISTULA SUPERFICIALIZATION;  Surgeon: Adine Hoof, MD;  Location: [facility] OR;  Service: Vascular;  Laterality: Left;   IR FLUORO GUIDE CV LINE RIGHT  12/07/2021   IR FLUORO GUIDE CV LINE RIGHT  06/07/2022   IR PTA VENOUS EXCEPT DIALYSIS CIRCUIT  06/07/2022    IR US  GUIDE VASC ACCESS RIGHT  12/07/2021   PERIPHERAL VASCULAR BALLOON ANGIOPLASTY Right 01/23/2023   Procedure: PERIPHERAL VASCULAR BALLOON ANGIOPLASTY;  Surgeon: Baron Border, MD;  Location: [facility] INVASIVE CV LAB;  Service: Cardiovascular;  Laterality: Right;  SVC   POLYPECTOMY  02/09/2020   Procedure: POLYPECTOMY;  Surgeon: Mansouraty, Albino Alu., MD;  Location: [facility] ENDOSCOPY;  Service: Gastroenterology;;   RETINAL DETACHMENT REPAIR W/ SCLERAL BUCKLE LE     Left   UPPER EXTREMITY VENOGRAPHY Left 07/17/2022   Procedure: UPPER EXTREMITY VENOGRAPHY;  Surgeon: Adine Hoof, MD;  Location: [facility] INVASIVE CV LAB;  Service: Cardiovascular;  Laterality: Left;       Home Medications    Prior to Admission medications   Medication Sig Start Date End Date Taking? Authorizing Provider  allopurinol  (ZYLOPRIM ) 300 MG tablet Take 0.5 tablets (150 mg total) by mouth daily. After dialysis 04/30/23  Yes Nche, Connye Delaine, NP  carvedilol  (COREG ) 25 MG tablet TAKE 1 TABLET(25 MG) BY MOUTH TWICE DAILY WITH A MEAL 05/25/22  Yes Chandrasekhar, Mahesh A, MD  colchicine  0.6 MG tablet Take 1 tablet by mouth daily. 07/07/22  Yes [provider]  diclofenac  Sodium (VOLTAREN ) 1 % GEL Apply 2 g topically. 11/30/17  Yes [provider]  dorzolamide -timolol  (COSOPT ) 22.3-6.8 MG/ML ophthalmic solution Place 1 drop into  the left eye 2 (two) times daily. 03/10/19  Yes [provider]  Doxercalciferol (HECTOROL IV) 3 mcg. 05/05/23 05/03/24 Yes [provider]  isosorbide  mononitrate (IMDUR ) 60 MG 24 hr tablet Take 60 mg by mouth daily.   Yes [provider]  sevelamer carbonate (RENVELA) 800 MG tablet Take 800 mg by mouth 3 (three) times daily with meals. 05/23/22  Yes [provider]  XPHOZAH 30 MG TABS Take 30 mg by mouth 2 (two) times daily. 06/19/22  Yes [provider]  colchicine  0.6 MG tablet Take 0.6 mg by mouth daily. 07/07/22   [provider]   fluticasone  (FLONASE ) 50 MCG/ACT nasal spray Place 2 sprays into both nostrils daily. 05/25/23   Soldatova, Liuba, MD  latanoprost  (XALATAN ) 0.005 % ophthalmic solution Place 1 drop into the left eye at bedtime. 04/14/19   [provider]  levocetirizine (XYZAL  ALLERGY 24HR) 5 MG tablet Take 1 tablet (5 mg total) by mouth every evening. 05/25/23   Soldatova, Liuba, MD  lidocaine -prilocaine (EMLA) cream Apply 1 Application topically daily as needed (Dialysis days). 02/21/22   [provider]  LINZESS  145 MCG CAPS capsule TAKE 1 CAPSULE(145 MCG) BY MOUTH DAILY BEFORE BREAKFAST. GIVEN** 10/31/21   Nche, Connye Delaine, NP  methylPREDNISolone  (MEDROL  DOSEPAK) 4 MG TBPK tablet Take as directed, with food 04/18/23   Roosvelt Colla, MD  pantoprazole  (PROTONIX ) 20 MG tablet 40mg  in PM x 1week then 20mg  in PM till complete 04/30/23   Nche, Connye Delaine, NP    Family History Family History  Problem Relation Age of Onset   Hypertension Mother    Hyperlipidemia Mother    Heart disease Mother    Kidney disease Mother    Depression Mother    Stomach cancer Father    Healthy Sister        x2   Healthy Brother        x2   Hypertension Maternal Grandmother    Stroke Maternal Grandmother    Heart disease Maternal Grandmother    Hyperlipidemia Maternal Grandmother    Stroke Maternal Grandfather    Hypertension Maternal Grandfather    Heart disease Maternal Grandfather    Hyperlipidemia Maternal Grandfather    Alzheimer's disease Maternal Grandfather    Cancer - Other Paternal Grandmother    Healthy Daughter        x1   Healthy Son        x2   Diabetes Neg Hx    Heart attack Neg Hx    Sudden death Neg Hx    Colon cancer Neg Hx    Esophageal cancer Neg Hx    Pancreatic cancer Neg Hx    Inflammatory bowel disease Neg Hx    Liver disease Neg Hx    Rectal cancer Neg Hx     Social History Social History   Tobacco Use   Smoking status: Never   Smokeless tobacco: Never  Vaping Use    Vaping status: Never Used  Substance Use Topics   Alcohol use: Not Currently    Comment: occ   Drug use: Never     Allergies   Patient has no known allergies.   Review of Systems Review of Systems Per HPI  Physical Exam Triage Vital Signs ED Triage Vitals  Encounter Vitals Group     BP 05/30/23 1924 111/76     Systolic BP Percentile --      Diastolic BP Percentile --      Pulse Rate 05/30/23  1924 96     Resp 05/30/23 1924 20     Temp 05/30/23 1924 98.3 F (36.8 C)     Temp Source 05/30/23 1924 Oral     SpO2 05/30/23 1924 96 %     Weight 05/30/23 1921 (!) 337 lb (152.9 kg)     Height 05/30/23 1921 5\' 8"  (1.727 m)     Head Circumference --      Peak Flow --      Pain Score 05/30/23 1919 0     Pain Loc --      Pain Education --      Exclude from Growth Chart --    No data found.  Updated Vital Signs BP 111/76 (BP Location: Left Arm) Comment: Thigh Cuff  Pulse 96   Temp 98.3 F (36.8 C) (Oral)   Resp 20   Ht 5\' 8"  (1.727 m)   Wt (!) 337 lb (152.9 kg)   SpO2 96%   BMI 51.24 kg/m   Visual Acuity Right Eye Distance:   Left Eye Distance:   Bilateral Distance:    Right Eye Near:   Left Eye Near:    Bilateral Near:     Physical Exam Vitals and nursing note reviewed.  Constitutional:      Appearance: He is not ill-appearing or toxic-appearing.  HENT:     Head: Normocephalic and atraumatic.     Right Ear: Hearing and external ear normal.     Left Ear: Hearing and external ear normal.     Nose: Nose normal.     Mouth/Throat:     Lips: Pink.  Eyes:     General: Lids are normal. Vision grossly intact. Gaze aligned appropriately.     Extraocular Movements: Extraocular movements intact.     Conjunctiva/sclera: Conjunctivae normal.  Cardiovascular:     Rate and Rhythm: Normal rate and regular rhythm.     Heart sounds: Normal heart sounds, S1 normal and S2 normal.  Pulmonary:     Effort: Pulmonary effort is normal. No respiratory distress.     Breath  sounds: Normal breath sounds and air entry.  Abdominal:     General: Bowel sounds are normal.     Palpations: Abdomen is soft.     Tenderness: There is no abdominal tenderness. There is no right CVA tenderness, left CVA tenderness or guarding.  Musculoskeletal:     Cervical back: Neck supple.  Skin:    General: Skin is warm and dry.     Capillary Refill: Capillary refill takes less than 2 seconds.     Findings: No rash.  Neurological:     General: No focal deficit present.     Mental Status: He is alert and oriented to person, place, and time. Mental status is at baseline.     Cranial Nerves: No dysarthria or facial asymmetry.  Psychiatric:        Mood and Affect: Mood normal.        Speech: Speech normal.        Behavior: Behavior normal.        Thought Content: Thought content normal.        Judgment: Judgment normal.      UC Treatments / Results  Labs (all labs ordered are listed, but only abnormal results are displayed) Labs Reviewed  POCT URINALYSIS DIP (MANUAL ENTRY) - Abnormal; Notable for the following components:      Result Value   Blood, UA small (*)    Protein Ur, POC >=  300 (*)    All other components within normal limits    EKG   Radiology No results found.  Procedures Procedures (including critical care time)  Medications Ordered in UC Medications - No data to display  Initial Impression / Assessment and Plan / UC Course  I have reviewed the triage vital signs and the nursing notes.  Pertinent labs & imaging results that were available during my care of the patient were reviewed by me and considered in my medical decision making (see chart for details).   1. Urinary frequency, ESRD Patient with ESRD on dialysis presents with urinary frequency, urgency, and decreased urinary output from baseline for the last few days.  Vital signs are hemodynamically stable.  Urinalysis shows small RBC with proteinuria.  History of renal stones. I'd like for him  to proceed to the nearest ER for further workup and evaluation for a scan to evaluate presence or absence of renal stone as he is at increased risks for complications secondary to nephrolithiasis. Additionally, he'd benefit from stat labs to ensure normal renal function and rule out AKI causing change in urinary output.  Discussed clinical concerns/exam findings leading to recommendation for further workup in the ER setting and risks of deferring ER visit with patient/family. Patient/family express understanding and agreement with plan, discharged to ER via private car.   Final Clinical Impressions(s) / UC Diagnoses   Final diagnoses:  Urinary hesitancy  End stage renal disease [facility])     Discharge Instructions      Please go to the nearest ER for further workup and evaluation to rule out acute kidney injury, kidney stone due to decreased output in the setting of end stage renal disease.   ED Prescriptions   None    PDMP not reviewed this encounter.   Starlene Eaton, [place] 06/06/23 2047

## 2023-05-30 NOTE — ED Provider Notes (Signed)
 [place] [facility] Provider Note   CSN: [id number] Arrival date & time: 05/30/23  2103     History {Add pertinent medical, surgical, social history, OB history to HPI:1} Chief Complaint  Patient presents with   Urinary Frequency    Frank Coffey is a 43 y.o. male.  The history is provided by the patient and medical records.  Urinary Frequency  Frank Coffey is a 43 y.o. male who presents to the Emergency Department complaining of urinary frequency.  He presents to the emergency department for evaluation of urinary frequency over the last week.  He has a history of ESRD and dialyzes Tuesday, Thursday, Saturday.  He has been a dialysis patient for the last 2 years and makes minimal urine.  About 1 week ago he started making urine about once a day, which is new for him.  Starting on Sunday he developed urinary frequency with small volume of urine.  He did have some discomfort today when he needed to urinate.  No fevers, diarrhea.  He is experiencing occasional nausea and vomiting with an episode of months on Thursday and once on Sunday.  He received his last dialysis session on Tuesday without difficulty.  No lower extremity edema or abdominal pain.   He is circumcised.  No penile discharge.  Home Medications Prior to Admission medications   Medication Sig Start Date End Date Taking? Authorizing Provider  allopurinol  (ZYLOPRIM ) 300 MG tablet Take 0.5 tablets (150 mg total) by mouth daily. After dialysis 04/30/23   Nche, Connye Delaine, NP  carvedilol  (COREG ) 25 MG tablet TAKE 1 TABLET(25 MG) BY MOUTH TWICE DAILY WITH A MEAL 05/25/22   Chandrasekhar, Mahesh A, MD  colchicine  0.6 MG tablet Take 0.6 mg by mouth daily. 07/07/22   [provider]  colchicine  0.6 MG tablet Take 1 tablet by mouth daily. 07/07/22   [provider]  diclofenac  Sodium (VOLTAREN ) 1 % GEL Apply 2 g topically. 11/30/17   [provider]   dorzolamide -timolol  (COSOPT ) 22.3-6.8 MG/ML ophthalmic solution Place 1 drop into the left eye 2 (two) times daily. 03/10/19   [provider]  Doxercalciferol (HECTOROL IV) 3 mcg. 05/05/23 05/03/24  [provider]  fluticasone  (FLONASE ) 50 MCG/ACT nasal spray Place 2 sprays into both nostrils daily. 05/25/23   Soldatova, Liuba, MD  isosorbide  mononitrate (IMDUR ) 60 MG 24 hr tablet Take 60 mg by mouth daily.    [provider]  latanoprost  (XALATAN ) 0.005 % ophthalmic solution Place 1 drop into the left eye at bedtime. 04/14/19   [provider]  levocetirizine (XYZAL ALLERGY 24HR) 5 MG tablet Take 1 tablet (5 mg total) by mouth every evening. 05/25/23   Soldatova, Liuba, MD  lidocaine -prilocaine (EMLA) cream Apply 1 Application topically daily as needed (Dialysis days). 02/21/22   [provider]  LINZESS  145 MCG CAPS capsule TAKE 1 CAPSULE(145 MCG) BY MOUTH DAILY BEFORE BREAKFAST. GIVEN** 10/31/21   Nche, Connye Delaine, NP  methylPREDNISolone  (MEDROL  DOSEPAK) 4 MG TBPK tablet Take as directed, with food 04/18/23   Roosvelt Colla, MD  pantoprazole  (PROTONIX ) 20 MG tablet 40mg  in PM x 1week then 20mg  in PM till complete 04/30/23   Nche, Charlotte Lum, NP  sevelamer carbonate (RENVELA) 800 MG tablet Take 800 mg by mouth 3 (three) times daily with meals. 05/23/22   [provider]  XPHOZAH 30 MG TABS Take 30 mg by mouth 2 (two) times daily. 06/19/22   [provider]  Allergies    Patient has no known allergies.    Review of Systems   Review of Systems  Genitourinary:  Positive for frequency.  All other systems reviewed and are negative.   Physical Exam Updated Vital Signs BP (!) 169/95 (BP Location: Right Arm)   Pulse 95   Temp 98.5 F (36.9 C) (Oral)   Resp 17   Ht 5\' 8"  (1.727 m)   Wt (!) 152.9 kg   SpO2 98%   BMI 51.24 kg/m  Physical Exam Vitals and nursing note reviewed.  Constitutional:      Appearance: He is well-developed.   HENT:     Head: Normocephalic and atraumatic.  Cardiovascular:     Rate and Rhythm: Normal rate and regular rhythm.  Pulmonary:     Effort: Pulmonary effort is normal. No respiratory distress.     Breath sounds: Normal breath sounds.  Abdominal:     Palpations: Abdomen is soft.     Tenderness: There is no abdominal tenderness. There is no guarding or rebound.  Musculoskeletal:        General: No tenderness.     Comments: 1+ nonpitting edema to bilateral lower extremities.  Fistula in the left upper extremity.  Skin:    General: Skin is warm and dry.  Neurological:     Mental Status: He is alert and oriented to person, place, and time.  Psychiatric:        Behavior: Behavior normal.     ED Results / Procedures / Treatments   Labs (all labs ordered are listed, but only abnormal results are displayed) Labs Reviewed  URINALYSIS, ROUTINE W REFLEX MICROSCOPIC - Abnormal; Notable for the following components:      Result Value   pH 8.5 (*)    Glucose, UA 100 (*)    Hgb urine dipstick SMALL (*)    Protein, ur >=300 (*)    All other components within normal limits  CBC WITH DIFFERENTIAL/PLATELET - Abnormal; Notable for the following components:   RBC 4.03 (*)    Hemoglobin 11.8 (*)    HCT 37.1 (*)    Monocytes Absolute 1.4 (*)    All other components within normal limits  URINALYSIS, MICROSCOPIC (REFLEX) - Abnormal; Notable for the following components:   Bacteria, UA RARE (*)    All other components within normal limits  COMPREHENSIVE METABOLIC PANEL WITH GFR    EKG None  Radiology No results found.  Procedures Procedures  {Document cardiac monitor, telemetry assessment procedure when appropriate:1}  Medications Ordered in ED Medications  diphenhydrAMINE  (BENADRYL ) capsule 50 mg (50 mg Oral Given 05/30/23 2245)    ED Course/ Medical Decision Making/ A&P   {   Click here for ABCD2, HEART and other calculatorsREFRESH Note before signing :1}                               Medical Decision Making Amount and/or Complexity of Data Reviewed Labs: ordered. Radiology: ordered.   ***  {Document critical care time when appropriate:1} {Document review of labs and clinical decision tools ie heart score, Chads2Vasc2 etc:1}  {Document your independent review of radiology images, and any outside records:1} {Document your discussion with family members, caretakers, and with consultants:1} {Document social determinants of health affecting pt's care:1} {Document your decision making why or why not admission, treatments were needed:1} Final Clinical Impression(s) / ED Diagnoses Final diagnoses:  None    Rx / DC Orders  ED Discharge Orders     None

## 2023-05-30 NOTE — ED Triage Notes (Signed)
"  I don't pee that much but since Sunday I am getting and urge to pee often and that continues sometimes being nothing when I try to pee". "I do dialysis (Tuesday, Thursday and Saturdays), I am in kidney failure (some improvement), still waiting for transplant. Recent cold "but mostly better). No fever.

## 2023-05-31 DIAGNOSIS — R350 Frequency of micturition: Secondary | ICD-10-CM | POA: Diagnosis not present

## 2023-05-31 NOTE — Discharge Instructions (Addendum)
 The cause of your symptoms was not identified today.  Please follow-up with both your family doctor and your kidney doctor.  You had a CT scan performed that showed multiple changes that need to be followed up with your family doctor.  You do have a cyst on your left kidney that will need to be looked at in more detail.  Please get rechecked if you have new or concerning symptoms.

## 2023-06-01 LAB — URINE CULTURE: Culture: NO GROWTH

## 2023-06-12 ENCOUNTER — Ambulatory Visit (HOSPITAL_COMMUNITY)
Admission: RE | Admit: 2023-06-12 | Discharge: 2023-06-12 | Disposition: A | Discharge status: Home / Self Care | Source: Ambulatory Visit | Attending: *Deleted | Admitting: *Deleted

## 2023-06-12 ENCOUNTER — Ambulatory Visit (HOSPITAL_COMMUNITY): Admission: RE | Admit: 2023-06-12 | Source: Ambulatory Visit

## 2023-06-12 DIAGNOSIS — R1310 Dysphagia, unspecified: Secondary | ICD-10-CM

## 2023-06-13 ENCOUNTER — Other Ambulatory Visit: Payer: Self-pay | Admitting: Internal Medicine

## 2023-06-20 ENCOUNTER — Other Ambulatory Visit (INDEPENDENT_AMBULATORY_CARE_PROVIDER_SITE_OTHER): Payer: Self-pay | Admitting: Otolaryngology

## 2023-06-20 DIAGNOSIS — R1310 Dysphagia, unspecified: Secondary | ICD-10-CM

## 2023-06-27 ENCOUNTER — Ambulatory Visit (HOSPITAL_COMMUNITY)
Admission: RE | Admit: 2023-06-27 | Discharge: 2023-06-27 | Disposition: A | Discharge status: Home / Self Care | Source: Ambulatory Visit | Attending: Nurse Practitioner | Admitting: Nurse Practitioner

## 2023-06-27 ENCOUNTER — Ambulatory Visit (HOSPITAL_COMMUNITY)
Admission: RE | Admit: 2023-06-27 | Discharge: 2023-06-27 | Disposition: A | Discharge status: Home / Self Care | Source: Ambulatory Visit | Attending: Otolaryngology | Admitting: Otolaryngology

## 2023-06-27 DIAGNOSIS — R09A2 Foreign body sensation, throat: Secondary | ICD-10-CM | POA: Insufficient documentation

## 2023-06-27 DIAGNOSIS — R1310 Dysphagia, unspecified: Secondary | ICD-10-CM

## 2023-06-27 DIAGNOSIS — R1314 Dysphagia, pharyngoesophageal phase: Secondary | ICD-10-CM | POA: Diagnosis not present

## 2023-06-27 DIAGNOSIS — R059 Cough, unspecified: Secondary | ICD-10-CM

## 2023-06-27 NOTE — Therapy (Signed)
 Modified Barium Swallow Study  Patient Details  Name: Frank Coffey MRN: [id number] Date of Birth: [birth year]  Today's Date: 06/27/2023  Modified Barium Swallow completed.  Full report located under Chart Review in the Imaging Section.  History of Present Illness Tevan Marian is a 43 y.o. male with PMH: DM, CHF, CKD stage 3 (on ESRD), GERD, OSA, IBS, HTN, HLD, obesity.  He presented to this OP MBS secondary to c/o incidents of regurgitation of undigested food, worsening of chronic cough despite taking Nexium at night and avoiding trigger foods like soda, coffee. He is scheduled for a barium esophagram today following this MBS.   Clinical Impression Patient presents with an oropharyngeal swallow that is WNL as per this MBS. Swallow initiation occured at posterior angle of ramus or vallecula for all tested barium consistencies. No pharyngeal residuals observed s/p initial swallows. No penetration or aspiration with any of the tested barium consistencies. PES opening appeared WNL. Barium tablet deferred as patient getting esophagram immediately after this MBS.   Swallow Evaluation Recommendations Recommendations: PO diet PO Diet Recommendation: Regular;Thin liquids (Level 0) Swallowing strategies  : Slow rate;Small bites/sips Postural changes: Position pt fully upright for meals;Stay upright 30-60 min after meals    Jacqualine Mater, MA, CCC-SLP Speech Therapy

## 2023-06-30 NOTE — Addendum Note (Signed)
 Encounter addended by: Jacqualine Mater, CCC-SLP on: 06/30/2023 4:23 PM  Actions taken: Flowsheet data copied forward, Flowsheet accepted

## 2023-08-31 ENCOUNTER — Ambulatory Visit: Admitting: Nurse Practitioner

## 2023-08-31 ENCOUNTER — Encounter: Payer: Self-pay | Admitting: Student

## 2023-09-26 ENCOUNTER — Ambulatory Visit (INDEPENDENT_AMBULATORY_CARE_PROVIDER_SITE_OTHER): Admitting: Otolaryngology

## 2023-11-21 ENCOUNTER — Ambulatory Visit: Admitting: Physician Assistant
